# Patient Record
Sex: Male | Born: 1940
Health system: Southern US, Community
[De-identification: ages and names within clinical notes are randomized; demographics above are authoritative.]

## PROBLEM LIST (undated history)

## (undated) DIAGNOSIS — I2510 Atherosclerotic heart disease of native coronary artery without angina pectoris: Secondary | ICD-10-CM

## (undated) DIAGNOSIS — D649 Anemia, unspecified: Secondary | ICD-10-CM

## (undated) DIAGNOSIS — M009 Pyogenic arthritis, unspecified: Secondary | ICD-10-CM

## (undated) DIAGNOSIS — I350 Nonrheumatic aortic (valve) stenosis: Secondary | ICD-10-CM

## (undated) DIAGNOSIS — IMO0002 Reserved for concepts with insufficient information to code with codable children: Secondary | ICD-10-CM

## (undated) DIAGNOSIS — G252 Other specified forms of tremor: Secondary | ICD-10-CM

## (undated) DIAGNOSIS — M25569 Pain in unspecified knee: Secondary | ICD-10-CM

## (undated) DIAGNOSIS — R51 Headache: Secondary | ICD-10-CM

## (undated) DIAGNOSIS — F3289 Other specified depressive episodes: Secondary | ICD-10-CM

## (undated) DIAGNOSIS — D469 Myelodysplastic syndrome, unspecified: Secondary | ICD-10-CM

## (undated) DIAGNOSIS — G4733 Obstructive sleep apnea (adult) (pediatric): Secondary | ICD-10-CM

## (undated) DIAGNOSIS — E119 Type 2 diabetes mellitus without complications: Secondary | ICD-10-CM

## (undated) DIAGNOSIS — G250 Essential tremor: Secondary | ICD-10-CM

## (undated) DIAGNOSIS — M48061 Spinal stenosis, lumbar region without neurogenic claudication: Secondary | ICD-10-CM

## (undated) DIAGNOSIS — G8929 Other chronic pain: Secondary | ICD-10-CM

## (undated) DIAGNOSIS — D72829 Elevated white blood cell count, unspecified: Secondary | ICD-10-CM

## (undated) DIAGNOSIS — F329 Major depressive disorder, single episode, unspecified: Secondary | ICD-10-CM

## (undated) DIAGNOSIS — E782 Mixed hyperlipidemia: Secondary | ICD-10-CM

## (undated) DIAGNOSIS — R42 Dizziness and giddiness: Secondary | ICD-10-CM

## (undated) DIAGNOSIS — I10 Essential (primary) hypertension: Secondary | ICD-10-CM

## (undated) HISTORY — PX: OTHER SURGICAL HISTORY: SHX169

## (undated) HISTORY — PX: BACK SURGERY: SHX140

---
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
## 1998-09-19 ENCOUNTER — Emergency Department (HOSPITAL_COMMUNITY): Admission: EM | Admit: 1998-09-19 | Discharge: 1998-09-19 | Payer: Self-pay | Admitting: Emergency Medicine

## 1999-04-05 ENCOUNTER — Encounter: Payer: Self-pay | Admitting: Gastroenterology

## 1999-04-05 ENCOUNTER — Ambulatory Visit (HOSPITAL_COMMUNITY): Admission: RE | Admit: 1999-04-05 | Discharge: 1999-04-05 | Payer: Self-pay | Admitting: Gastroenterology

## 1999-10-11 ENCOUNTER — Encounter: Payer: Self-pay | Admitting: Gastroenterology

## 1999-10-11 ENCOUNTER — Ambulatory Visit (HOSPITAL_COMMUNITY): Admission: RE | Admit: 1999-10-11 | Discharge: 1999-10-11 | Payer: Self-pay | Admitting: Gastroenterology

## 1999-10-19 ENCOUNTER — Encounter: Payer: Self-pay | Admitting: General Surgery

## 1999-10-19 ENCOUNTER — Ambulatory Visit (HOSPITAL_COMMUNITY): Admission: RE | Admit: 1999-10-19 | Discharge: 1999-10-19 | Payer: Self-pay | Admitting: General Surgery

## 2000-01-07 ENCOUNTER — Observation Stay (HOSPITAL_COMMUNITY): Admission: RE | Admit: 2000-01-07 | Discharge: 2000-01-09 | Payer: Self-pay | Admitting: General Surgery

## 2000-03-23 ENCOUNTER — Encounter: Payer: Self-pay | Admitting: Neurology

## 2000-03-23 ENCOUNTER — Ambulatory Visit (HOSPITAL_COMMUNITY): Admission: RE | Admit: 2000-03-23 | Discharge: 2000-03-23 | Payer: Self-pay | Admitting: Neurology

## 2001-03-13 ENCOUNTER — Encounter: Payer: Self-pay | Admitting: Neurology

## 2001-03-13 ENCOUNTER — Ambulatory Visit (HOSPITAL_COMMUNITY): Admission: RE | Admit: 2001-03-13 | Discharge: 2001-03-13 | Payer: Self-pay | Admitting: Neurology

## 2001-08-02 ENCOUNTER — Ambulatory Visit (HOSPITAL_COMMUNITY): Admission: RE | Admit: 2001-08-02 | Discharge: 2001-08-02 | Payer: Self-pay | Admitting: Gastroenterology

## 2001-08-02 ENCOUNTER — Encounter: Payer: Self-pay | Admitting: Gastroenterology

## 2001-08-07 ENCOUNTER — Encounter: Payer: Self-pay | Admitting: Gastroenterology

## 2001-08-07 ENCOUNTER — Ambulatory Visit (HOSPITAL_COMMUNITY): Admission: RE | Admit: 2001-08-07 | Discharge: 2001-08-07 | Payer: Self-pay | Admitting: Gastroenterology

## 2001-11-30 ENCOUNTER — Encounter (INDEPENDENT_AMBULATORY_CARE_PROVIDER_SITE_OTHER): Payer: Self-pay | Admitting: Specialist

## 2001-11-30 ENCOUNTER — Ambulatory Visit (HOSPITAL_COMMUNITY): Admission: RE | Admit: 2001-11-30 | Discharge: 2001-11-30 | Payer: Self-pay | Admitting: Gastroenterology

## 2002-11-05 ENCOUNTER — Encounter (INDEPENDENT_AMBULATORY_CARE_PROVIDER_SITE_OTHER): Payer: Self-pay | Admitting: *Deleted

## 2002-11-05 ENCOUNTER — Ambulatory Visit (HOSPITAL_COMMUNITY): Admission: RE | Admit: 2002-11-05 | Discharge: 2002-11-05 | Payer: Self-pay | Admitting: Gastroenterology

## 2003-05-21 ENCOUNTER — Encounter: Payer: Self-pay | Admitting: Neurology

## 2003-05-21 ENCOUNTER — Ambulatory Visit (HOSPITAL_COMMUNITY): Admission: RE | Admit: 2003-05-21 | Discharge: 2003-05-21 | Payer: Self-pay | Admitting: Neurology

## 2003-08-31 ENCOUNTER — Emergency Department (HOSPITAL_COMMUNITY): Admission: EM | Admit: 2003-08-31 | Discharge: 2003-08-31 | Payer: Self-pay | Admitting: *Deleted

## 2003-08-31 ENCOUNTER — Encounter: Payer: Self-pay | Admitting: *Deleted

## 2003-10-08 ENCOUNTER — Ambulatory Visit (HOSPITAL_COMMUNITY): Admission: RE | Admit: 2003-10-08 | Discharge: 2003-10-08 | Payer: Self-pay

## 2003-10-13 ENCOUNTER — Encounter: Admission: RE | Admit: 2003-10-13 | Discharge: 2003-11-06 | Payer: Self-pay | Admitting: Neurology

## 2003-12-19 ENCOUNTER — Encounter: Admission: RE | Admit: 2003-12-19 | Discharge: 2003-12-19 | Payer: Self-pay | Admitting: Neurology

## 2003-12-25 ENCOUNTER — Encounter: Admission: RE | Admit: 2003-12-25 | Discharge: 2003-12-25 | Payer: Self-pay | Admitting: Neurology

## 2004-01-08 ENCOUNTER — Encounter: Admission: RE | Admit: 2004-01-08 | Discharge: 2004-01-08 | Payer: Self-pay | Admitting: Neurology

## 2004-04-05 ENCOUNTER — Encounter: Admission: RE | Admit: 2004-04-05 | Discharge: 2004-04-05 | Payer: Self-pay | Admitting: Neurology

## 2004-05-08 ENCOUNTER — Ambulatory Visit (HOSPITAL_COMMUNITY): Admission: RE | Admit: 2004-05-08 | Discharge: 2004-05-08 | Payer: Self-pay | Admitting: Neurology

## 2004-07-12 ENCOUNTER — Ambulatory Visit (HOSPITAL_COMMUNITY): Admission: RE | Admit: 2004-07-12 | Discharge: 2004-07-12 | Payer: Self-pay | Admitting: Neurosurgery

## 2004-09-09 ENCOUNTER — Encounter: Admission: RE | Admit: 2004-09-09 | Discharge: 2004-09-09 | Payer: Self-pay | Admitting: Neurosurgery

## 2004-09-23 ENCOUNTER — Encounter: Admission: RE | Admit: 2004-09-23 | Discharge: 2004-09-23 | Payer: Self-pay | Admitting: Neurosurgery

## 2005-02-14 ENCOUNTER — Encounter: Admission: RE | Admit: 2005-02-14 | Discharge: 2005-02-14 | Payer: Self-pay | Admitting: Internal Medicine

## 2005-04-17 ENCOUNTER — Ambulatory Visit (HOSPITAL_COMMUNITY): Admission: RE | Admit: 2005-04-17 | Discharge: 2005-04-17 | Payer: Self-pay | Admitting: Neurology

## 2005-07-26 ENCOUNTER — Encounter: Admission: RE | Admit: 2005-07-26 | Discharge: 2005-07-26 | Payer: Self-pay

## 2005-08-09 ENCOUNTER — Encounter: Admission: RE | Admit: 2005-08-09 | Discharge: 2005-08-09 | Payer: Self-pay | Admitting: Internal Medicine

## 2005-08-24 ENCOUNTER — Encounter: Admission: RE | Admit: 2005-08-24 | Discharge: 2005-08-24 | Payer: Self-pay | Admitting: Internal Medicine

## 2006-06-27 ENCOUNTER — Encounter: Admission: RE | Admit: 2006-06-27 | Discharge: 2006-06-27 | Payer: Self-pay | Admitting: Neurology

## 2007-10-15 ENCOUNTER — Encounter: Admission: RE | Admit: 2007-10-15 | Discharge: 2007-10-15 | Payer: Self-pay | Admitting: Gastroenterology

## 2007-10-31 ENCOUNTER — Encounter: Admission: RE | Admit: 2007-10-31 | Discharge: 2007-10-31 | Payer: Self-pay | Admitting: Neurosurgery

## 2008-01-09 ENCOUNTER — Encounter: Admission: RE | Admit: 2008-01-09 | Discharge: 2008-01-09 | Payer: Self-pay | Admitting: Interventional Cardiology

## 2008-01-11 ENCOUNTER — Inpatient Hospital Stay (HOSPITAL_BASED_OUTPATIENT_CLINIC_OR_DEPARTMENT_OTHER): Admission: RE | Admit: 2008-01-11 | Discharge: 2008-01-11 | Payer: Self-pay | Admitting: Interventional Cardiology

## 2008-05-07 ENCOUNTER — Encounter: Admission: RE | Admit: 2008-05-07 | Discharge: 2008-05-07 | Payer: Self-pay | Admitting: Neurology

## 2008-05-13 ENCOUNTER — Encounter: Admission: RE | Admit: 2008-05-13 | Discharge: 2008-05-13 | Payer: Self-pay | Admitting: Neurology

## 2008-05-30 ENCOUNTER — Inpatient Hospital Stay (HOSPITAL_COMMUNITY): Admission: RE | Admit: 2008-05-30 | Discharge: 2008-05-31 | Payer: Self-pay | Admitting: Interventional Cardiology

## 2008-06-07 ENCOUNTER — Emergency Department (HOSPITAL_COMMUNITY): Admission: EM | Admit: 2008-06-07 | Discharge: 2008-06-07 | Payer: Self-pay | Admitting: Emergency Medicine

## 2008-06-07 ENCOUNTER — Ambulatory Visit: Payer: Self-pay | Admitting: Cardiology

## 2009-09-22 ENCOUNTER — Inpatient Hospital Stay (HOSPITAL_COMMUNITY): Admission: AD | Admit: 2009-09-22 | Discharge: 2009-09-24 | Payer: Self-pay | Admitting: Obstetrics and Gynecology

## 2009-10-05 ENCOUNTER — Encounter (INDEPENDENT_AMBULATORY_CARE_PROVIDER_SITE_OTHER): Payer: Self-pay | Admitting: Interventional Cardiology

## 2009-10-30 ENCOUNTER — Encounter: Admission: RE | Admit: 2009-10-30 | Discharge: 2009-10-30 | Payer: Self-pay | Admitting: Gastroenterology

## 2010-07-19 ENCOUNTER — Encounter: Admission: RE | Admit: 2010-07-19 | Discharge: 2010-07-19 | Payer: Self-pay | Admitting: Internal Medicine

## 2010-08-12 ENCOUNTER — Inpatient Hospital Stay (HOSPITAL_COMMUNITY): Admission: EM | Admit: 2010-08-12 | Discharge: 2010-08-13 | Payer: Self-pay

## 2010-10-29 ENCOUNTER — Encounter
Admission: RE | Admit: 2010-10-29 | Discharge: 2010-10-29 | Payer: Self-pay | Source: Home / Self Care | Attending: Neurology | Admitting: Neurology

## 2010-11-09 ENCOUNTER — Encounter
Admission: RE | Admit: 2010-11-09 | Discharge: 2010-11-09 | Payer: Self-pay | Source: Home / Self Care | Attending: Neurology | Admitting: Neurology

## 2010-12-02 ENCOUNTER — Encounter
Admission: RE | Admit: 2010-12-02 | Discharge: 2010-12-02 | Payer: Self-pay | Source: Home / Self Care | Attending: Neurology | Admitting: Neurology

## 2010-12-11 ENCOUNTER — Encounter: Payer: Self-pay | Admitting: Neurology

## 2010-12-12 ENCOUNTER — Encounter: Payer: Self-pay | Admitting: Internal Medicine

## 2010-12-12 ENCOUNTER — Encounter: Payer: Self-pay | Admitting: Gastroenterology

## 2010-12-12 ENCOUNTER — Encounter: Payer: Self-pay | Admitting: Neurology

## 2011-01-31 ENCOUNTER — Other Ambulatory Visit: Payer: Self-pay | Admitting: Dermatology

## 2011-02-03 LAB — COMPREHENSIVE METABOLIC PANEL
ALT: 28 U/L (ref 0–53)
Alkaline Phosphatase: 37 U/L — ABNORMAL LOW (ref 39–117)
BUN: 21 mg/dL (ref 6–23)
Calcium: 9.4 mg/dL (ref 8.4–10.5)
Chloride: 94 mEq/L — ABNORMAL LOW (ref 96–112)
Glucose, Bld: 157 mg/dL — ABNORMAL HIGH (ref 70–99)
Potassium: 4 mEq/L (ref 3.5–5.1)
Total Bilirubin: 0.2 mg/dL — ABNORMAL LOW (ref 0.3–1.2)
Total Protein: 6.6 g/dL (ref 6.0–8.3)

## 2011-02-03 LAB — TROPONIN I: Troponin I: 0.05 ng/mL (ref 0.00–0.06)

## 2011-02-03 LAB — CARDIAC PANEL(CRET KIN+CKTOT+MB+TROPI)
CK, MB: 3.7 ng/mL (ref 0.3–4.0)
CK, MB: 4.4 ng/mL — ABNORMAL HIGH (ref 0.3–4.0)
Total CK: 123 U/L (ref 7–232)
Total CK: 92 U/L (ref 7–232)
Troponin I: 0.01 ng/mL (ref 0.00–0.06)
Troponin I: 0.02 ng/mL (ref 0.00–0.06)

## 2011-02-03 LAB — GLUCOSE, CAPILLARY
Glucose-Capillary: 165 mg/dL — ABNORMAL HIGH (ref 70–99)
Glucose-Capillary: 197 mg/dL — ABNORMAL HIGH (ref 70–99)
Glucose-Capillary: 216 mg/dL — ABNORMAL HIGH (ref 70–99)
Glucose-Capillary: 216 mg/dL — ABNORMAL HIGH (ref 70–99)
Glucose-Capillary: 281 mg/dL — ABNORMAL HIGH (ref 70–99)

## 2011-02-03 LAB — MRSA PCR SCREENING: MRSA by PCR: NEGATIVE

## 2011-02-03 LAB — CBC
MCHC: 33.6 g/dL (ref 30.0–36.0)
MCV: 88 fL (ref 78.0–100.0)
Platelets: 220 10*3/uL (ref 150–400)
RDW: 14.8 % (ref 11.5–15.5)
WBC: 14.9 10*3/uL — ABNORMAL HIGH (ref 4.0–10.5)

## 2011-02-03 LAB — PROTIME-INR: Prothrombin Time: 13.4 seconds (ref 11.6–15.2)

## 2011-02-03 LAB — DIFFERENTIAL: Lymphocytes Relative: 12 % (ref 12–46)

## 2011-02-03 LAB — HEMOGLOBIN A1C: Mean Plasma Glucose: 154 mg/dL — ABNORMAL HIGH (ref <117)

## 2011-02-03 LAB — CK TOTAL AND CKMB (NOT AT ARMC): Relative Index: 3.3 — ABNORMAL HIGH (ref 0.0–2.5)

## 2011-02-23 LAB — BASIC METABOLIC PANEL
CO2: 26 mEq/L (ref 19–32)
CO2: 32 mEq/L (ref 19–32)
Chloride: 95 mEq/L — ABNORMAL LOW (ref 96–112)
GFR calc Af Amer: >60 mL/min (ref >60)
GFR calc Af Amer: >60 mL/min (ref >60)
Glucose, Bld: 135 mg/dL — ABNORMAL HIGH (ref 70–99)
Potassium: 4.6 mEq/L (ref 3.5–5.1)
Potassium: 5 mEq/L (ref 3.5–5.1)
Sodium: 131 mEq/L — ABNORMAL LOW (ref 135–145)
Sodium: 134 mEq/L — ABNORMAL LOW (ref 135–145)

## 2011-02-23 LAB — DIFFERENTIAL
Basophils Absolute: 0 10*3/uL (ref 0.0–0.1)
Eosinophils Absolute: 0.1 10*3/uL (ref 0.0–0.7)
Eosinophils Relative: 1 % (ref 0–5)
Lymphocytes Relative: 16 % (ref 12–46)
Neutrophils Relative %: 75 % (ref 43–77)

## 2011-02-23 LAB — APTT: aPTT: 32 seconds (ref 24–37)

## 2011-02-23 LAB — CBC
HCT: 35.9 % — ABNORMAL LOW (ref 39.0–52.0)
HCT: 37.7 % — ABNORMAL LOW (ref 39.0–52.0)
Hemoglobin: 12.5 g/dL — ABNORMAL LOW (ref 13.0–17.0)
Hemoglobin: 13 g/dL (ref 13.0–17.0)
MCHC: 34.6 g/dL (ref 30.0–36.0)
MCV: 88.8 fL (ref 78.0–100.0)
Platelets: 199 10*3/uL (ref 150–400)
RBC: 4 MIL/uL — ABNORMAL LOW (ref 4.22–5.81)
RBC: 4.17 MIL/uL — ABNORMAL LOW (ref 4.22–5.81)
RDW: 12.7 % (ref 11.5–15.5)
RDW: 12.7 % (ref 11.5–15.5)
WBC: 9.5 10*3/uL (ref 4.0–10.5)

## 2011-02-23 LAB — CARDIAC PANEL(CRET KIN+CKTOT+MB+TROPI)
CK, MB: 5 ng/mL — ABNORMAL HIGH (ref 0.3–4.0)
CK, MB: 5.6 ng/mL — ABNORMAL HIGH (ref 0.3–4.0)
Relative Index: 4.1 — ABNORMAL HIGH (ref 0.0–2.5)
Relative Index: 5.5 — ABNORMAL HIGH (ref 0.0–2.5)
Total CK: 157 U/L (ref 7–232)
Total CK: 168 U/L (ref 7–232)
Troponin I: 0.01 ng/mL (ref 0.00–0.06)
Troponin I: 0.01 ng/mL (ref 0.00–0.06)

## 2011-02-23 LAB — COMPREHENSIVE METABOLIC PANEL
Albumin: 4 g/dL (ref 3.5–5.2)
BUN: 16 mg/dL (ref 6–23)
Creatinine, Ser: 1 mg/dL (ref 0.4–1.5)
Total Protein: 6.4 g/dL (ref 6.0–8.3)

## 2011-02-23 LAB — BRAIN NATRIURETIC PEPTIDE: Pro B Natriuretic peptide (BNP): <30 pg/mL (ref 0.0–100.0)

## 2011-02-23 LAB — PROTIME-INR: INR: 1.07 (ref 0.00–1.49)

## 2011-03-22 ENCOUNTER — Other Ambulatory Visit (HOSPITAL_COMMUNITY): Payer: Self-pay | Admitting: Internal Medicine

## 2011-03-25 ENCOUNTER — Other Ambulatory Visit (HOSPITAL_COMMUNITY): Payer: Self-pay | Admitting: Internal Medicine

## 2011-03-25 DIAGNOSIS — T17998A Other foreign object in respiratory tract, part unspecified causing other injury, initial encounter: Secondary | ICD-10-CM

## 2011-03-31 ENCOUNTER — Other Ambulatory Visit (HOSPITAL_COMMUNITY): Payer: Self-pay

## 2011-03-31 ENCOUNTER — Ambulatory Visit (HOSPITAL_COMMUNITY)
Admission: RE | Admit: 2011-03-31 | Discharge: 2011-03-31 | Disposition: A | Discharge status: Home / Self Care | Payer: Medicare Other | Source: Ambulatory Visit | Attending: Internal Medicine | Admitting: Internal Medicine

## 2011-03-31 ENCOUNTER — Ambulatory Visit (HOSPITAL_COMMUNITY): Payer: Self-pay

## 2011-03-31 DIAGNOSIS — T17998A Other foreign object in respiratory tract, part unspecified causing other injury, initial encounter: Secondary | ICD-10-CM

## 2011-03-31 DIAGNOSIS — R1310 Dysphagia, unspecified: Secondary | ICD-10-CM | POA: Insufficient documentation

## 2011-04-05 NOTE — Cardiovascular Report (Signed)
NAME:  Jorge, Sherman NO.:  [id number]   MEDICAL RECORD NO.:  [id number]          PATIENT TYPE:  OIB   LOCATION:  1962                         FACILITY:  [facility]   PHYSICIAN:  Lyn Records, M.D.   DATE OF BIRTH:  27-Feb-1941   DATE OF PROCEDURE:  01/11/2008  DATE OF DISCHARGE:                            CARDIAC CATHETERIZATION   INDICATIONS FOR PROCEDURE:  Cardiolite study demonstrating ischemic left  ventricular dilatation, transient on Cardiolite study, EF 53%.  No  predominant region underperfusion was noted.  Study is being done to  rule out matched 3-vessel coronary disease with ischemic dilatation.   PROCEDURES PERFORMED:  1. Left heart catheterization.  2. Selective coronary angiography.  3. Left ventriculorrhaphy.   DESCRIPTION OF PROCEDURE:  After informed consent, a 4-French sheath was  placed in the right femoral artery using a modified Seldinger technique.  A 4-French A2 multipurpose catheter was then used for hemodynamic  recordings, left ventriculography by hand injection, and selective right  coronary angiography.  Multiple views of the right coronary were  performed and 200 mcg of intracoronary nitroglycerin was administered.   We used a #4 left Judkins catheter for left coronary angiography.  The  patient tolerated the procedure without complications.  Intravenous  labetalol. 20 mg, was given for blood pressure control.  No  complications occurred.   RESULTS:  1. Hemodynamic data:  (a)  Aortic pressure 142/85.  (b)  Left ventricular pressure 142/13.  1. Left ventriculography:  The left ventricular cavity size is normal.      Left ventricular function is normal.  The ejection fraction is      estimated to be greater than 60%.  2. Coronary angiography:  (a)  Left main coronary:  The left main is widely patent.  (b)  Left anterior descending coronary:  LAD is heavily calcified.  There is a region of particular heavy calcification in the  proximal/mid  vessel and had a bifurcation with the large branching first diagonal.  LAD in this region is difficult to interpret, but there is 70% to 80%  eccentric stenosis.  The diagonal in this region contains a 70% to 80%  stenosis as well.  The LAD beyond this point is large and supplies the  left ventricular apex.  The vessel is smooth.  No significant  obstruction is seen.  (c)  Circumflex artery:  The circumflex coronary artery is a large  vessel giving origin to 4 obtuse marginal branches, the first 2 are  small, third is a large branching vessel, and fourth is small.  No  significant obstruction is noted.  (d)  Right coronary:  The right coronary artery contains luminal  irregularities throughout the proximal, mid and distal vessel.  Catheter-  induced spasm is noted at the tip of the right coronary catheter  proximally.  No high-grade or significant stenoses are noted in the  right coronary.  The right coronary is dominant.   CONCLUSIONS:  1. Significant proximal LAD within a region of heavy calcification.      The lesion is in a bifurcational position with the large  first      diagonal.  2. Normal left ventricular function.   PLAN:  Anti-ischemic therapy.  Start beta-blocker therapy for blood  pressure.  Sublingual nitroglycerin, if any, even mild symptoms of  angina, and aspirin.  Consider PCI.  PCI would need to be with the DES.  This may prevent back surgery for a year.  We will speak with the  patient about this in detail.  It was conceivable that he could get  through his surgical procedure and have PCI performed thereafter.  PCI  would add some increase risk because of the heavy calcification and the  bifurcational status of the lesion.      Lyn Records, M.D.  Electronically Signed     HWS/MEDQ  D:  01/11/2008  T:  01/12/2008  Job:  [id number]   cc:   Hilda Lias, M.D.  Evie Lacks, MD  Georgianne Fick, M.D.  Carlos A. Timoteo Ace, M.D.

## 2011-04-05 NOTE — Cardiovascular Report (Signed)
NAME:  Jorge Sherman, Jorge Sherman NO.:  [id number]   MEDICAL RECORD NO.:  [id number]          PATIENT TYPE:  INP   LOCATION:  2921                         FACILITY:  [facility]   PHYSICIAN:  Lyn Records, M.D.   DATE OF BIRTH:  08-05-1941   DATE OF PROCEDURE:  DATE OF DISCHARGE:                            CARDIAC CATHETERIZATION   INDICATIONS:  The patient has had recurring chest discomfort responsive  to nitroglycerin.  Cardiac catheterization in February demonstrated 70-  80% LAD diagonal, bifurcation stenosis within a heavily calcified  segment, and 50% proximal right coronary stenosis.  A Cardiolite study  demonstrated ischemic dilatation without focal perfusion abnormality.  Because of continued symptoms, the plan is to perform intravascular  ultrasound on the LAD and the right coronary and to stent if indicated.   PROCEDURES PERFORMED:  1. Coronary angiography.  2. Intravascular ultrasound.  3. Bare-metal stent left anterior descending.  4. Bare-metal stent, right coronary.   DESCRIPTION:  After informed consent, a 6-French sheath was placed in  the right femoral artery using the modified Seldinger technique.  A 6-  [place] Judkins right catheter was used for right coronary angiography.  Deep intubation of the artery occurred with the catheter.  Angiography  was performed and at the catheter tip a filling defect was noted.  This  did not appear to be obstructive.  We then turned our attention to the  left coronary artery.  The left coronary was intubated with a 6-French  CLS 3.5-cm catheter.  We obtained guiding shots.  The lesion in the LAD  was intermediate, but appeared significant in certain views.  Heavy  calcification prevented high quality angiographic assessment.  We used  Angiomax bolus and infusion to achieve an ACT greater than 300.  We then  performed intravascular ultrasound using the Atlantis ultrasound device.  A cross-sectional area within the LAD just  proximal to the septal  perforator has circumferential calcium and a cross-sectional area of 3.9-  4.0 sq.m.  The distal reference diameter was around 3.2 mm and the  proximal reference diameter was greater than 4.  There was a region of  ectasia proximal to the LAD diagonal bifurcation.  We performed PCI  using predilatation with a [place] sprinter to 14 atmospheres 3.0 mm in  diameter, 12-mm long balloon.  We then deployed a Liberte 3.5 x 16-mm  long stent.  We postdilated the proximal one half of the stent with a  4.0 x 8-mm Quantum balloon at 12 atmospheres.  The diagonal branch  remained patent.  We protected the diagonal branch during the initial  inflations and stent deployment with BMW wire.  This wire was easily  retrieved.   We then turned our attention back to the right coronary, as the patient  complained of continuous chest discomfort during the procedure.  In fact  the patient's chest discomfort started when we began infiltrating his  groin with Xylocaine.  This seemed intensified with each coronary  injection.  It intensified with PCI and iris on the left coronary  system.  He continued after successful PCI on the LAD.  This led [place] to  revisualization the right coronary.  Recannulation of the right coronary  demonstrated the same eccentric proximal stenosis that appeared to be a  filling defect.  It is uncertain to me whether or not this filling  defect was obstructive, but certainly with him continuing to have chest  discomfort I felt that it needed to be further explored.  We used a  guide catheter 6-French, JR-4.  We were able to successfully negotiate  this region with a [place] wire and attempted to perform intravascular  ultrasound.  Intravascular ultrasound device would not course through  this stenosis.  We placed a buddy wire and still could not get the  intravascular ultrasound device down.  Angiography after attempts to get  the intravascular ultrasound device down  demonstrated an eccentric  greater than 80% stenosis.  The patient continued to have chest  discomfort throughout this entire time.  We predilated this area with  the 12-mm long x 3.0-mm [place] sprinter.  This intensified the chest  discomfort.  No ST-segment changes were noted during balloon inflations  or during nonballoon inflation.  We deployed a Multilink Vision 23-mm  long, 3.0-mm diameter stent around the bend in the proximal/mid-LAD to  cover the region of the vessel/plaque disruption.  We postdilated with a  3.25 x 20-mm long Quantum Maverick to 15 atmospheres.  Brisk antegrade  flow was noted and no obvious embolic sequelae or dye hang-up/slow flow  was noted.  The patient continued to have discomfort.  After finishing  the right coronary, we then took a relook at the left coronary to make  sure that there was not side branch occlusion or some explanation for  the continued chest discomfort.  LAD was widely patent.  No obvious  dissection in the left main was noted.  The proximal LAD contained a  focal region of 50% narrowing that was present prior to PCI.  Flow in  the LAD system and diagonal was also brisk.   We terminated the case.  We started a single bolus and infusion of  Integrilin.  Angiomax was discontinued.  The patient had been preloaded  with 300 mg of Plavix and had already been on Plavix chronically.   RESULTS:  1. Left coronary angiography demonstrated 70-80% stenosis in the mid      LAD, as it bifurcates with a large second diagonal.      a.     The circumflex and left main were widely patent.      b.     The right coronary contained a region of minimal disruption       just distal to the cath towards the side during diagnostic       angiography.  It is uncertain at this point if this is related to       plaque disruption from the catheter tip or spontaneous progression       in disease since the diagnostic cath in February.  2. Intravascular ultrasound:  Obvious  flow via LAD diagonal      bifurcation demonstrated a cross-sectional area of 4 mm square or      less with circumferential calcium.  3. Percutaneous coronary intervention.      a.     Bare metal Liberte stent with reduction in 70-80% LAD       stenosis to less than 10% with TIMI grade 3 flow and no evidence       of side branch obstruction.  b.     Successful bare-metal stent proximal and mid RCA with a 23-       mm long x 3.0-mm diameter Vision bare-metal stent with 0% stenosis       postprocedure and TIMI grade 3 flow.  No evidence of slow flow or       embolization is noted.   CONCLUSIONS:  1. Chest pain syndrome in this gentleman making it very difficult to      sort out what is ischemic/coronary related and what may be      musculoskeletal or secondary to other causes for chest pain.  2. Successful bare-metal stent LAD 70-80% stenosis to 0%.  3. Successful bare-metal stent of the RCA with reduction in stenosis      from 80% to 0%.  It is possible that the lesion in the right      coronary has developed and progressed since the diagnostic cath or      that.  Noncritical disease was converted to critical disease by      catheter tip disruption during coronary angiography.  4. Continuing chest pain syndrome without obvious source.  Must rule      out microembolization from one of the PCI sites.  Therefore, we      will place the patient on Integrilin, IV nitroglycerin, continue      Plavix, cycle enzymes, and follow clinically in step-down unit.   PLAN:  1. Cycle enzymes.  2. Plavix and aspirin combination for at least 4 weeks.  3. Further management of the patient's chest pain syndrome depending      upon his clinical course.      Lyn Records, M.D.  Electronically Signed     HWS/MEDQ  D:  05/30/2008  T:  05/31/2008  Job:  [id number]   cc:   Georgianne Fick, M.D.  Evie Lacks, MD  Hilda Lias, M.D.  Acie Fredrickson, MD

## 2011-04-05 NOTE — H&P (Signed)
NAME:  Jorge Sherman, Jorge Sherman NO.:  [id number]   MEDICAL RECORD NO.:  [id number]          PATIENT TYPE:  INP   LOCATION:  1827                         FACILITY:  [facility]   PHYSICIAN:  Vernice Jefferson, MD          DATE OF BIRTH:  05/04/1941   DATE OF ADMISSION:  DATE OF DISCHARGE:                              HISTORY & PHYSICAL   CHIEF COMPLAINT:  Chest pain x5 hours.   HISTORY OF PRESENT ILLNESS:  The patient is a 70 year old white male  with history of PCI on May 30, 2008 to RCA and mid LAD with bare-metal  stents, complains of chest pain that started today at approximately 4:30  p.m.  He states he took first nitroglycerin, no relief.  After second  nitroglycerin, he did get relief; however, he took an additional third  nitroglycerin that dropped his blood pressure.  He spoke with the on-  call cardiologist and told him to come to the emergency department.  At  that point, his chest pain had completely resolved.  Currently, he has  no pain now and he is feeling back to his baseline.  Prior to his PCI,  approximately 1 week ago, he has never had any type of chest pain or  discomfort.   PAST MEDICAL HISTORY:  1. Coronary artery disease (status post PCI to mid LAD with 3.5 x 16      mm [place] stent, RCA with a 3.0 x 23 mm Vision stent).  2. Hypertension.  3. Hyperlipidemia.  4. Chronic pain.   SOCIAL HISTORY:  Lives in the [place] area.  He has a 30-pack year of  smoking, but quit approximately a year ago.  No alcohol.  No drug use.   FAMILY HISTORY:  Noncontributory to the patient's current medical  condition.   REVIEW OF SYSTEMS:  Negative 11-point review of systems except for those  as dictated in the above HPI.   ALLERGIES:  No known drug allergies.   CURRENT MEDICATIONS:  1. Aspirin 325 mg a day.  2. Plavix 75 mg a day.  3. Lisinopril 10 mg once a day.  4. Hydrochlorothiazide 25 mg a day.  5. Neurontin 800 mg t.i.d.  6. Percocet two 10/325 tabs q.8 h. as  needed.  7. Nexium 40 mg a day.  8. Crestor 20 mg a day.   PHYSICAL EXAMINATION:  VITAL SIGNS:  His blood pressure is 118/72, heart  rate of 80, and temperature is afebrile.  GENERAL:  Well-developed, well-nourished white male, in no acute  distress.  HEENT:  Moist mucous membranes.  No scleral icterus.  No conjunctival  pallor.  NECK:  Supple.  Full range of motion.  No jugular venous distention.  CARDIOVASCULAR:  Regular rate and rhythm.  No rubs, murmurs, or gallops.  CHEST:  Clear to auscultation bilaterally.  No wheezes, rales, or  rhonchi.  ABDOMEN:  Soft, nontender, and nondistended.  Normoactive bowel sounds.  EXTREMITIES:  No peripheral edema.  Pulses 2+ bilaterally.  NEUROLOGIC:  Nonfocal.   Chest x-ray demonstrated no acute infiltrate or process.  EKG, normal  sinus rhythm, normal EKG.   LABORATORY DATA:  Normal white count, hemoglobin 10.7, and platelets  233.  Biomarkers are negative and Chem-8 is still pending.   IMPRESSION:  1. Probable acute coronary syndrome.  2. Hyperlipidemia.  3. Hypertension.  4. Chronic pain.   PLAN:  Admit to telemetry monitoring, Dr. Verdis Prime, aspirin, Plavix,  and heparin protocol.  Beta-blocker and statin also will be initiated  n.p.o. for a possible risk stratification in the morning.  We will also  continue his home medications for his chronic pain as he has been taking  this.      Vernice Jefferson, MD  Electronically Signed     JT/MEDQ  D:  06/07/2008  T:  06/08/2008  Job:  [id number]

## 2011-04-08 NOTE — Op Note (Signed)
NAME:  Jorge Sherman, Jorge Sherman                         ACCOUNT NO.:  [id number]   MEDICAL RECORD NO.:  [id number]                   PATIENT TYPE:  AMB   LOCATION:  ENDO                                 FACILITY:  [facility]   PHYSICIAN:  Petra Kuba, M.D.                 DATE OF BIRTH:  1941-02-02   DATE OF PROCEDURE:  11/05/2002  DATE OF DISCHARGE:                                 OPERATIVE REPORT   PROCEDURE:  Colonoscopy with polypectomy.   INDICATION:  Patient with history of colon polyps, family history of colon  cancer, due for repeat screening.  Consent was signed after risks, benefits,  methods, options thoroughly discussed multiple times in the past.   MEDICINES USED:  Demerol 110, Versed 12.   DESCRIPTION OF PROCEDURE:  Rectal inspection pertinent for external  hemorrhoids, small.  Digital exam was negative.  The video colonoscope was  inserted, easily advanced around the colon to the cecum.  This did not  require any abdominal pressure or any position changes.  No obvious  abnormality was seen on insertion.  The cecum was identified by the  appendiceal orifice and the ileocecal valve.  In fact, the scope was  inserted a short ways into the terminal ileum which was normal.  Photodocumentation was obtained.  The scope was slowly withdrawn.  The prep  was fairly adequate, did require greater than one liter of washing and  suctioning, and still there was some stool adherent to the wall which could  not be washed off, but on slow withdrawal through the colon, multiple tiny  hyperplastic-appearing polyps were seen.  One in the cecum was cold biopsied  x 2.  One in the ascending was hot biopsied on a setting of 20-20, did have  some bleeding which did stop spontaneously.  Went ahead and did his other  hot biopsies on 30-30 of the transverse, descending, and sigmoid.  A total  of approximately five hot biopsies were done.  Once back in the rectum, the  scope was retroflexed, pertinet for  some internal hemorrhoids.  The scope  was straightened and readvanced a short ways up the left side of the colon;  air was suctioned and scope removed.  The patient tolerated the procedure  well.  There was no obvious immediate complication.   ENDOSCOPIC DIAGNOSES:  1. Internal-external small hemorrhoids.  2. Fairly adequate prep, greater than one liter of washing.  3. Multiple tiny questionable hyperplastic-appearing polyps with hot     biopsies in the sigmoid, descending, transverse, and ascending.  Cold     biopsy in the cecum.  4. Otherwise normal to the terminal ileum.    PLAN:  Await pathology to determine future colonic screening. Yearly rectals  and guaiacs per primary care, and I am happy to see back at any time p.r.n.  Petra Kuba, M.D.    MEM/MEDQ  D:  11/05/2002  T:  11/05/2002  Job:  [id number]   cc:   Christiana Fuchs, M.D.  [address]  [place], [place] [address]  Fax: [phone]

## 2011-04-08 NOTE — Procedures (Signed)
[facility]  Patient:    Jorge Sherman, Jorge Sherman Visit Number: [id number] MRN: [id number]          Service Type: END Location: ENDO Attending Physician:  Deneen Harts Dictated by:   Petra Kuba, M.D. Proc. Date: 11/30/01 Admit Date:  11/30/2001   CC:         Trudee Kuster, M.D.   Procedure Report  PROCEDURE:  Esophagogastroduodenoscopy with biopsy.  INDICATION:  Patient with persistent upper tract symptoms.  Consent was signed after risks, benefits, methods, and options thoroughly discussed in the office on multiple occasions.  MEDICATIONS:  Demerol 100, Versed 10.  DESCRIPTION OF PROCEDURE:  Video endoscope was inserted by direct vision.  The esophagus was normal.  In the distal esophagus was a tiny hiatal hernia.  I did not appreciate any obvious Barretts or signs of esophagitis.  The scope was inserted into the stomach, advanced through a normal antrum, normal pylorus, into a normal duodenal bulb, and around the C-loop to a normal second portion of the duodenum.  A few scattered duodenal biopsies were obtained. The scope was withdrawn back to the bulb, and a good look there ruled out ulcers in that location.  The scope was withdrawn back to the stomach and retroflexed.  Cardia, angularis, fundus, lesser and greater were normal except for some minimal amount of gastritis seen.  The scope was straightened, and other than the minimal gastritis, no other abnormalities were seen.  A few scattered antral biopsies and a few of the proximal stomach were obtained to rule out Helicobacter or other abnormalities.  The scope was then slowly withdrawn back to 20 cm.  No additional esophageal findings were seen.  We did take a few distal esophageal biopsies.  Unfortunately, the first distal esophageal biopsy was put in the second container with the stomach, but three additional biopsies of the distal esophagus were put in the third container. Air was  suctioned and the scope slowly withdrawn.  Again, no esophageal abnormality was seen.  The scope was removed.  The patient tolerated the procedure well.  There was no obvious immediate complication.  ENDOSCOPIC DIAGNOSES: 1. Tiny hiatal hernia. 2. Minimal gastritis, status post biopsy. 3. Otherwise, within normal limits to second portion of the duodenum, status    post distal esophageal biopsy and duodenal biopsies just to rule out any    microscopic abnormality.  PLAN:  Await pathology.  Follow up p.r.n..  Continue current management, and otherwise follow up in 2-3 months.  We did re-discuss motility problems and irritable bowel with periodic checks just to make sure nothing has changed. Dictated by:   Petra Kuba, M.D. Attending Physician:  Deneen Harts DD:  11/30/01 TD:  11/30/01 Job: [id number]

## 2011-04-08 NOTE — Discharge Summary (Signed)
NAMEMarland Sherman  GURKARAN, RAHM NO.:  [id number]   MEDICAL RECORD NO.:  [id number]          PATIENT TYPE:  INP   LOCATION:  2921                         FACILITY:  [facility]   PHYSICIAN:  Lyn Records, M.D.   DATE OF BIRTH:  [birth year]   DATE OF ADMISSION:  05/30/2008  DATE OF DISCHARGE:  05/31/2008                               DISCHARGE SUMMARY   DISCHARGE DIAGNOSES:  1. Coronary artery disease, status post bare-metal stent to the left      anterior descending and percutaneous coronary intervention to the      right coronary artery on May 30, 2008.  2. Hypertension, treated.  3. Hyperlipidemia, treated.  4. Intolerance to beta-blockers.  5. Depression.  6. Chronic back and knee discomfort.  7. Iodine allergy.   HOSPITAL COURSE:  Jorge Sherman is a 70 year old male patient who was  admitted after having recurrent chest discomfort responsive to  nitroglycerin.  The cardiac catheterization in February 2009, showed a  70-80% LAD diagonal lesions and 50% right coronary artery stenosis.  A  subsequent Cardiolite study demonstrated ischemic dilatation without  focal perfusion abnormality but because of his continued symptoms, the  plan was for him to come into the hospital for intravascular ultrasound  of the LAD and right coronary artery to see if a stent was indicated.   As per Dr. Michaelle Copas dictated note, the patient did require bare-metal  stent placement to the LAD as well as the right coronary artery reducing  back the lesion to a 0% post procedure stenosis.  The patient tolerated  the procedure well and was kept overnight in the hospital and was  discharged to home the following day.   LABORATORY STUDIES:  Included 3 sets of cardiac isoenzymes, which were  negative.  Platelet count was 246, white count 11.7, hemoglobin 10.6,  and hematocrit 30.5.  BUN 21, creatinine 1.12, sodium 138, potassium  3.9, and BNP of 81.   The patient was discharged to home in stable and  improved condition.   The patient was discharged to home on the following  medications:  1. Baby aspirin 81 mg a day.  2. Plavix 75 mg a day.  3. Neurontin 800 mg 3 times a day.  4. Percocet 10/325 four times per day.  5. Lisinopril 40 mg a day.  6. Protonix 40 mg a day.  7. Chlorthalidone 25 mg a day.  8. Trazodone 50 mg a day.  9. Celebrex 200 mg a day.  10.Fenofibrate 160 mg daily.  11.Lorazepam 1 mg twice a day as needed.  12.Sublingual nitroglycerin p.r.n. chest pain.  13.Cymbalta 60 mg a day.  14.Crestor 20 mg a day.  15.Chlordiazepoxide 4 times a day.   The patient was remained on a low-fat heart-healthy diet.  Clean cath  site gently with soap and water.  Increase activity slowly.  No lifting  for 1 week over 10 pounds.  No driving for 2 days.  Follow with Dr.  Katrinka Blazing on June 16, 2008 at 2 p.m.      Guy Franco, P.A.      Sherilyn Cooter  Malissa Hippo, M.D.  Electronically Signed    LB/MEDQ  D:  07/15/2008  T:  07/16/2008  Job:  [id number]   cc:   Georgianne Fick, M.D.

## 2011-04-25 ENCOUNTER — Ambulatory Visit: Payer: Medicare Other | Attending: Neurology | Admitting: Rehabilitation

## 2011-04-25 DIAGNOSIS — M545 Low back pain, unspecified: Secondary | ICD-10-CM | POA: Insufficient documentation

## 2011-04-25 DIAGNOSIS — M2569 Stiffness of other specified joint, not elsewhere classified: Secondary | ICD-10-CM | POA: Insufficient documentation

## 2011-04-25 DIAGNOSIS — IMO0001 Reserved for inherently not codable concepts without codable children: Secondary | ICD-10-CM | POA: Insufficient documentation

## 2011-07-29 ENCOUNTER — Other Ambulatory Visit: Payer: Self-pay | Admitting: Gastroenterology

## 2011-08-18 LAB — POCT I-STAT, CHEM 8
Calcium, Ion: 1.19
Chloride: 97
Creatinine, Ser: 1.2
Glucose, Bld: 126 — ABNORMAL HIGH
HCT: 35 — ABNORMAL LOW

## 2011-08-18 LAB — CARDIAC PANEL(CRET KIN+CKTOT+MB+TROPI)
CK, MB: 3.1
Relative Index: INVALID
Relative Index: INVALID
Total CK: 59
Total CK: 60
Troponin I: 0.04
Troponin I: 0.04

## 2011-08-18 LAB — BASIC METABOLIC PANEL
BUN: 21
CO2: 34 — ABNORMAL HIGH
Calcium: 9.1
Creatinine, Ser: 1.12
GFR calc non Af Amer: >60
Glucose, Bld: 112 — ABNORMAL HIGH
Sodium: 138

## 2011-08-18 LAB — CBC
Hemoglobin: 10.6 — ABNORMAL LOW
MCHC: 34.6
Platelets: 227
RDW: 13.1

## 2011-08-18 LAB — B-NATRIURETIC PEPTIDE (CONVERTED LAB): Pro B Natriuretic peptide (BNP): 81

## 2011-08-19 LAB — DIFFERENTIAL
Basophils Relative: 0 % (ref 0–1)
Eosinophils Absolute: 0.2 10*3/uL (ref 0.0–0.7)
Eosinophils Relative: 2 % (ref 0–5)
Lymphs Abs: 1.5 10*3/uL (ref 0.7–4.0)
Neutrophils Relative %: 69 % (ref 43–77)

## 2011-08-19 LAB — B-NATRIURETIC PEPTIDE (CONVERTED LAB): Pro B Natriuretic peptide (BNP): <30 pg/mL (ref 0.0–100.0)

## 2011-08-19 LAB — CBC
HCT: 31.1 % — ABNORMAL LOW (ref 39.0–52.0)
MCHC: 34.3 g/dL (ref 30.0–36.0)
MCV: 86.1 fL (ref 78.0–100.0)
Platelets: 233 10*3/uL (ref 150–400)
RDW: 13 % (ref 11.5–15.5)
WBC: 8.4 10*3/uL (ref 4.0–10.5)

## 2011-08-19 LAB — POCT CARDIAC MARKERS
CKMB, poc: 1.6 ng/mL (ref 1.0–8.0)
Myoglobin, poc: 109 ng/mL (ref 12–200)
Operator id: 133351
Troponin i, poc: <0.05 ng/mL (ref 0.00–0.05)

## 2011-12-08 DIAGNOSIS — M25549 Pain in joints of unspecified hand: Secondary | ICD-10-CM | POA: Diagnosis not present

## 2011-12-08 DIAGNOSIS — M719 Bursopathy, unspecified: Secondary | ICD-10-CM | POA: Diagnosis not present

## 2011-12-08 DIAGNOSIS — M76899 Other specified enthesopathies of unspecified lower limb, excluding foot: Secondary | ICD-10-CM | POA: Diagnosis not present

## 2011-12-08 DIAGNOSIS — M159 Polyosteoarthritis, unspecified: Secondary | ICD-10-CM | POA: Diagnosis not present

## 2011-12-08 DIAGNOSIS — M67919 Unspecified disorder of synovium and tendon, unspecified shoulder: Secondary | ICD-10-CM | POA: Diagnosis not present

## 2011-12-08 DIAGNOSIS — M25569 Pain in unspecified knee: Secondary | ICD-10-CM | POA: Diagnosis not present

## 2011-12-12 DIAGNOSIS — I10 Essential (primary) hypertension: Secondary | ICD-10-CM | POA: Diagnosis not present

## 2011-12-12 DIAGNOSIS — E782 Mixed hyperlipidemia: Secondary | ICD-10-CM | POA: Diagnosis not present

## 2011-12-12 DIAGNOSIS — M545 Low back pain: Secondary | ICD-10-CM | POA: Diagnosis not present

## 2011-12-12 DIAGNOSIS — E119 Type 2 diabetes mellitus without complications: Secondary | ICD-10-CM | POA: Diagnosis not present

## 2011-12-12 DIAGNOSIS — I2510 Atherosclerotic heart disease of native coronary artery without angina pectoris: Secondary | ICD-10-CM | POA: Diagnosis not present

## 2011-12-20 DIAGNOSIS — R39198 Other difficulties with micturition: Secondary | ICD-10-CM | POA: Diagnosis not present

## 2011-12-20 DIAGNOSIS — N401 Enlarged prostate with lower urinary tract symptoms: Secondary | ICD-10-CM | POA: Diagnosis not present

## 2011-12-20 DIAGNOSIS — R351 Nocturia: Secondary | ICD-10-CM | POA: Diagnosis not present

## 2011-12-20 DIAGNOSIS — R3911 Hesitancy of micturition: Secondary | ICD-10-CM | POA: Diagnosis not present

## 2011-12-23 DIAGNOSIS — M48061 Spinal stenosis, lumbar region without neurogenic claudication: Secondary | ICD-10-CM | POA: Diagnosis not present

## 2011-12-23 DIAGNOSIS — IMO0002 Reserved for concepts with insufficient information to code with codable children: Secondary | ICD-10-CM | POA: Diagnosis not present

## 2011-12-29 DIAGNOSIS — H2510 Age-related nuclear cataract, unspecified eye: Secondary | ICD-10-CM | POA: Diagnosis not present

## 2011-12-29 DIAGNOSIS — H52209 Unspecified astigmatism, unspecified eye: Secondary | ICD-10-CM | POA: Diagnosis not present

## 2012-01-10 ENCOUNTER — Other Ambulatory Visit: Payer: Self-pay | Admitting: Dermatology

## 2012-01-10 DIAGNOSIS — L578 Other skin changes due to chronic exposure to nonionizing radiation: Secondary | ICD-10-CM | POA: Diagnosis not present

## 2012-01-10 DIAGNOSIS — D0439 Carcinoma in situ of skin of other parts of face: Secondary | ICD-10-CM | POA: Diagnosis not present

## 2012-01-10 DIAGNOSIS — L820 Inflamed seborrheic keratosis: Secondary | ICD-10-CM | POA: Diagnosis not present

## 2012-01-10 DIAGNOSIS — D485 Neoplasm of uncertain behavior of skin: Secondary | ICD-10-CM | POA: Diagnosis not present

## 2012-01-10 DIAGNOSIS — C44320 Squamous cell carcinoma of skin of unspecified parts of face: Secondary | ICD-10-CM | POA: Diagnosis not present

## 2012-01-16 DIAGNOSIS — IMO0002 Reserved for concepts with insufficient information to code with codable children: Secondary | ICD-10-CM | POA: Diagnosis not present

## 2012-01-31 ENCOUNTER — Other Ambulatory Visit: Payer: Self-pay | Admitting: Dermatology

## 2012-01-31 DIAGNOSIS — C44319 Basal cell carcinoma of skin of other parts of face: Secondary | ICD-10-CM | POA: Diagnosis not present

## 2012-01-31 DIAGNOSIS — D0439 Carcinoma in situ of skin of other parts of face: Secondary | ICD-10-CM | POA: Diagnosis not present

## 2012-01-31 DIAGNOSIS — C44320 Squamous cell carcinoma of skin of unspecified parts of face: Secondary | ICD-10-CM | POA: Diagnosis not present

## 2012-02-03 DIAGNOSIS — IMO0002 Reserved for concepts with insufficient information to code with codable children: Secondary | ICD-10-CM | POA: Diagnosis not present

## 2012-02-16 DIAGNOSIS — R42 Dizziness and giddiness: Secondary | ICD-10-CM | POA: Diagnosis not present

## 2012-02-21 ENCOUNTER — Other Ambulatory Visit: Payer: Self-pay | Admitting: Neurology

## 2012-02-21 DIAGNOSIS — D72829 Elevated white blood cell count, unspecified: Secondary | ICD-10-CM

## 2012-02-21 DIAGNOSIS — R42 Dizziness and giddiness: Secondary | ICD-10-CM

## 2012-02-23 ENCOUNTER — Ambulatory Visit
Admission: RE | Admit: 2012-02-23 | Discharge: 2012-02-23 | Disposition: A | Discharge status: Home / Self Care | Payer: Medicare Other | Source: Ambulatory Visit | Attending: Neurology | Admitting: Neurology

## 2012-02-23 DIAGNOSIS — D72829 Elevated white blood cell count, unspecified: Secondary | ICD-10-CM | POA: Diagnosis not present

## 2012-02-23 DIAGNOSIS — R42 Dizziness and giddiness: Secondary | ICD-10-CM

## 2012-02-23 DIAGNOSIS — R51 Headache: Secondary | ICD-10-CM | POA: Diagnosis not present

## 2012-03-06 ENCOUNTER — Ambulatory Visit: Payer: Medicare Other | Admitting: Physical Therapy

## 2012-03-09 ENCOUNTER — Ambulatory Visit: Payer: Medicare Other | Admitting: Physical Therapy

## 2012-03-13 ENCOUNTER — Ambulatory Visit: Payer: Medicare Other | Admitting: Physical Therapy

## 2012-03-14 DIAGNOSIS — R079 Chest pain, unspecified: Secondary | ICD-10-CM | POA: Diagnosis not present

## 2012-03-14 DIAGNOSIS — R5383 Other fatigue: Secondary | ICD-10-CM | POA: Diagnosis not present

## 2012-03-14 DIAGNOSIS — R21 Rash and other nonspecific skin eruption: Secondary | ICD-10-CM | POA: Diagnosis not present

## 2012-03-14 DIAGNOSIS — M25569 Pain in unspecified knee: Secondary | ICD-10-CM | POA: Diagnosis not present

## 2012-03-15 ENCOUNTER — Ambulatory Visit: Payer: Medicare Other | Admitting: Physical Therapy

## 2012-03-21 DIAGNOSIS — R079 Chest pain, unspecified: Secondary | ICD-10-CM | POA: Diagnosis not present

## 2012-03-21 DIAGNOSIS — E291 Testicular hypofunction: Secondary | ICD-10-CM | POA: Diagnosis not present

## 2012-03-21 DIAGNOSIS — M25569 Pain in unspecified knee: Secondary | ICD-10-CM | POA: Diagnosis not present

## 2012-03-21 DIAGNOSIS — R21 Rash and other nonspecific skin eruption: Secondary | ICD-10-CM | POA: Diagnosis not present

## 2012-03-21 DIAGNOSIS — R918 Other nonspecific abnormal finding of lung field: Secondary | ICD-10-CM | POA: Diagnosis not present

## 2012-03-21 DIAGNOSIS — M1710 Unilateral primary osteoarthritis, unspecified knee: Secondary | ICD-10-CM | POA: Diagnosis not present

## 2012-03-22 ENCOUNTER — Other Ambulatory Visit: Payer: Self-pay | Admitting: Internal Medicine

## 2012-03-22 ENCOUNTER — Ambulatory Visit
Admission: RE | Admit: 2012-03-22 | Discharge: 2012-03-22 | Disposition: A | Discharge status: Home / Self Care | Payer: Medicare Other | Source: Ambulatory Visit | Attending: Internal Medicine | Admitting: Internal Medicine

## 2012-03-22 DIAGNOSIS — R918 Other nonspecific abnormal finding of lung field: Secondary | ICD-10-CM

## 2012-03-30 DIAGNOSIS — IMO0002 Reserved for concepts with insufficient information to code with codable children: Secondary | ICD-10-CM | POA: Diagnosis not present

## 2012-04-04 DIAGNOSIS — E119 Type 2 diabetes mellitus without complications: Secondary | ICD-10-CM | POA: Diagnosis not present

## 2012-04-04 DIAGNOSIS — E782 Mixed hyperlipidemia: Secondary | ICD-10-CM | POA: Diagnosis not present

## 2012-04-04 DIAGNOSIS — Z125 Encounter for screening for malignant neoplasm of prostate: Secondary | ICD-10-CM | POA: Diagnosis not present

## 2012-04-04 DIAGNOSIS — I10 Essential (primary) hypertension: Secondary | ICD-10-CM | POA: Diagnosis not present

## 2012-04-04 DIAGNOSIS — R5381 Other malaise: Secondary | ICD-10-CM | POA: Diagnosis not present

## 2012-04-04 DIAGNOSIS — R5383 Other fatigue: Secondary | ICD-10-CM | POA: Diagnosis not present

## 2012-04-11 DIAGNOSIS — H908 Mixed conductive and sensorineural hearing loss, unspecified: Secondary | ICD-10-CM | POA: Diagnosis not present

## 2012-04-11 DIAGNOSIS — E291 Testicular hypofunction: Secondary | ICD-10-CM | POA: Diagnosis not present

## 2012-04-11 DIAGNOSIS — I2510 Atherosclerotic heart disease of native coronary artery without angina pectoris: Secondary | ICD-10-CM | POA: Diagnosis not present

## 2012-04-11 DIAGNOSIS — M545 Low back pain: Secondary | ICD-10-CM | POA: Diagnosis not present

## 2012-04-11 DIAGNOSIS — R5383 Other fatigue: Secondary | ICD-10-CM | POA: Diagnosis not present

## 2012-04-11 DIAGNOSIS — R5381 Other malaise: Secondary | ICD-10-CM | POA: Diagnosis not present

## 2012-05-11 DIAGNOSIS — IMO0002 Reserved for concepts with insufficient information to code with codable children: Secondary | ICD-10-CM | POA: Diagnosis not present

## 2012-05-22 DIAGNOSIS — M1710 Unilateral primary osteoarthritis, unspecified knee: Secondary | ICD-10-CM | POA: Diagnosis not present

## 2012-06-05 DIAGNOSIS — B372 Candidiasis of skin and nail: Secondary | ICD-10-CM | POA: Diagnosis not present

## 2012-06-05 DIAGNOSIS — L570 Actinic keratosis: Secondary | ICD-10-CM | POA: Diagnosis not present

## 2012-06-05 DIAGNOSIS — Q828 Other specified congenital malformations of skin: Secondary | ICD-10-CM | POA: Diagnosis not present

## 2012-07-05 DIAGNOSIS — N320 Bladder-neck obstruction: Secondary | ICD-10-CM | POA: Diagnosis not present

## 2012-07-05 DIAGNOSIS — N401 Enlarged prostate with lower urinary tract symptoms: Secondary | ICD-10-CM | POA: Diagnosis not present

## 2012-07-06 DIAGNOSIS — G252 Other specified forms of tremor: Secondary | ICD-10-CM | POA: Diagnosis not present

## 2012-07-06 DIAGNOSIS — R42 Dizziness and giddiness: Secondary | ICD-10-CM | POA: Diagnosis not present

## 2012-07-06 DIAGNOSIS — R51 Headache: Secondary | ICD-10-CM | POA: Diagnosis not present

## 2012-07-06 DIAGNOSIS — M25569 Pain in unspecified knee: Secondary | ICD-10-CM | POA: Diagnosis not present

## 2012-07-06 DIAGNOSIS — G250 Essential tremor: Secondary | ICD-10-CM | POA: Diagnosis not present

## 2012-08-06 DIAGNOSIS — D692 Other nonthrombocytopenic purpura: Secondary | ICD-10-CM | POA: Diagnosis not present

## 2012-08-06 DIAGNOSIS — L821 Other seborrheic keratosis: Secondary | ICD-10-CM | POA: Diagnosis not present

## 2012-08-06 DIAGNOSIS — L570 Actinic keratosis: Secondary | ICD-10-CM | POA: Diagnosis not present

## 2012-08-06 DIAGNOSIS — B372 Candidiasis of skin and nail: Secondary | ICD-10-CM | POA: Diagnosis not present

## 2012-08-07 DIAGNOSIS — E119 Type 2 diabetes mellitus without complications: Secondary | ICD-10-CM | POA: Diagnosis not present

## 2012-08-07 DIAGNOSIS — R5383 Other fatigue: Secondary | ICD-10-CM | POA: Diagnosis not present

## 2012-08-07 DIAGNOSIS — R5381 Other malaise: Secondary | ICD-10-CM | POA: Diagnosis not present

## 2012-08-07 DIAGNOSIS — E782 Mixed hyperlipidemia: Secondary | ICD-10-CM | POA: Diagnosis not present

## 2012-08-07 DIAGNOSIS — I10 Essential (primary) hypertension: Secondary | ICD-10-CM | POA: Diagnosis not present

## 2012-08-14 DIAGNOSIS — R5383 Other fatigue: Secondary | ICD-10-CM | POA: Diagnosis not present

## 2012-08-14 DIAGNOSIS — I2510 Atherosclerotic heart disease of native coronary artery without angina pectoris: Secondary | ICD-10-CM | POA: Diagnosis not present

## 2012-08-14 DIAGNOSIS — E119 Type 2 diabetes mellitus without complications: Secondary | ICD-10-CM | POA: Diagnosis not present

## 2012-08-14 DIAGNOSIS — I10 Essential (primary) hypertension: Secondary | ICD-10-CM | POA: Diagnosis not present

## 2012-08-14 DIAGNOSIS — R5381 Other malaise: Secondary | ICD-10-CM | POA: Diagnosis not present

## 2012-08-29 DIAGNOSIS — Z9181 History of falling: Secondary | ICD-10-CM | POA: Diagnosis not present

## 2012-08-29 DIAGNOSIS — S4980XA Other specified injuries of shoulder and upper arm, unspecified arm, initial encounter: Secondary | ICD-10-CM | POA: Diagnosis not present

## 2012-08-29 DIAGNOSIS — M25519 Pain in unspecified shoulder: Secondary | ICD-10-CM | POA: Diagnosis not present

## 2012-10-02 DIAGNOSIS — I2510 Atherosclerotic heart disease of native coronary artery without angina pectoris: Secondary | ICD-10-CM | POA: Diagnosis not present

## 2012-10-02 DIAGNOSIS — Z23 Encounter for immunization: Secondary | ICD-10-CM | POA: Diagnosis not present

## 2012-10-02 DIAGNOSIS — M719 Bursopathy, unspecified: Secondary | ICD-10-CM | POA: Diagnosis not present

## 2012-10-02 DIAGNOSIS — M76899 Other specified enthesopathies of unspecified lower limb, excluding foot: Secondary | ICD-10-CM | POA: Diagnosis not present

## 2012-10-02 DIAGNOSIS — I10 Essential (primary) hypertension: Secondary | ICD-10-CM | POA: Diagnosis not present

## 2012-10-02 DIAGNOSIS — F39 Unspecified mood [affective] disorder: Secondary | ICD-10-CM | POA: Diagnosis not present

## 2012-10-02 DIAGNOSIS — M67919 Unspecified disorder of synovium and tendon, unspecified shoulder: Secondary | ICD-10-CM | POA: Diagnosis not present

## 2012-10-02 DIAGNOSIS — M159 Polyosteoarthritis, unspecified: Secondary | ICD-10-CM | POA: Diagnosis not present

## 2012-10-02 DIAGNOSIS — E119 Type 2 diabetes mellitus without complications: Secondary | ICD-10-CM | POA: Diagnosis not present

## 2012-10-29 DIAGNOSIS — I10 Essential (primary) hypertension: Secondary | ICD-10-CM | POA: Diagnosis not present

## 2012-10-29 DIAGNOSIS — E785 Hyperlipidemia, unspecified: Secondary | ICD-10-CM | POA: Diagnosis not present

## 2012-10-29 DIAGNOSIS — I2510 Atherosclerotic heart disease of native coronary artery without angina pectoris: Secondary | ICD-10-CM | POA: Diagnosis not present

## 2012-10-29 DIAGNOSIS — I5032 Chronic diastolic (congestive) heart failure: Secondary | ICD-10-CM | POA: Diagnosis not present

## 2012-11-05 DIAGNOSIS — I2510 Atherosclerotic heart disease of native coronary artery without angina pectoris: Secondary | ICD-10-CM | POA: Diagnosis not present

## 2012-11-05 DIAGNOSIS — M48061 Spinal stenosis, lumbar region without neurogenic claudication: Secondary | ICD-10-CM | POA: Diagnosis not present

## 2012-11-05 DIAGNOSIS — I10 Essential (primary) hypertension: Secondary | ICD-10-CM | POA: Diagnosis not present

## 2012-11-05 DIAGNOSIS — M549 Dorsalgia, unspecified: Secondary | ICD-10-CM | POA: Diagnosis not present

## 2012-11-29 DIAGNOSIS — R079 Chest pain, unspecified: Secondary | ICD-10-CM | POA: Diagnosis not present

## 2012-11-29 DIAGNOSIS — IMO0001 Reserved for inherently not codable concepts without codable children: Secondary | ICD-10-CM | POA: Diagnosis not present

## 2012-11-29 DIAGNOSIS — W19XXXA Unspecified fall, initial encounter: Secondary | ICD-10-CM | POA: Diagnosis not present

## 2012-12-24 DIAGNOSIS — E119 Type 2 diabetes mellitus without complications: Secondary | ICD-10-CM | POA: Diagnosis not present

## 2012-12-24 DIAGNOSIS — I2510 Atherosclerotic heart disease of native coronary artery without angina pectoris: Secondary | ICD-10-CM | POA: Diagnosis not present

## 2012-12-24 DIAGNOSIS — I10 Essential (primary) hypertension: Secondary | ICD-10-CM | POA: Diagnosis not present

## 2012-12-27 DIAGNOSIS — E119 Type 2 diabetes mellitus without complications: Secondary | ICD-10-CM | POA: Diagnosis not present

## 2012-12-27 DIAGNOSIS — I2510 Atherosclerotic heart disease of native coronary artery without angina pectoris: Secondary | ICD-10-CM | POA: Diagnosis not present

## 2012-12-27 DIAGNOSIS — I10 Essential (primary) hypertension: Secondary | ICD-10-CM | POA: Diagnosis not present

## 2012-12-27 DIAGNOSIS — E782 Mixed hyperlipidemia: Secondary | ICD-10-CM | POA: Diagnosis not present

## 2013-02-21 DIAGNOSIS — E782 Mixed hyperlipidemia: Secondary | ICD-10-CM | POA: Diagnosis not present

## 2013-02-21 DIAGNOSIS — E119 Type 2 diabetes mellitus without complications: Secondary | ICD-10-CM | POA: Diagnosis not present

## 2013-02-21 DIAGNOSIS — I10 Essential (primary) hypertension: Secondary | ICD-10-CM | POA: Diagnosis not present

## 2013-02-21 DIAGNOSIS — I2510 Atherosclerotic heart disease of native coronary artery without angina pectoris: Secondary | ICD-10-CM | POA: Diagnosis not present

## 2013-03-08 DIAGNOSIS — IMO0002 Reserved for concepts with insufficient information to code with codable children: Secondary | ICD-10-CM | POA: Diagnosis not present

## 2013-03-14 DIAGNOSIS — E782 Mixed hyperlipidemia: Secondary | ICD-10-CM | POA: Diagnosis not present

## 2013-03-14 DIAGNOSIS — I2510 Atherosclerotic heart disease of native coronary artery without angina pectoris: Secondary | ICD-10-CM | POA: Diagnosis not present

## 2013-03-14 DIAGNOSIS — L0233 Carbuncle of buttock: Secondary | ICD-10-CM | POA: Diagnosis not present

## 2013-03-14 DIAGNOSIS — I10 Essential (primary) hypertension: Secondary | ICD-10-CM | POA: Diagnosis not present

## 2013-04-10 DIAGNOSIS — L988 Other specified disorders of the skin and subcutaneous tissue: Secondary | ICD-10-CM | POA: Diagnosis not present

## 2013-04-10 DIAGNOSIS — F39 Unspecified mood [affective] disorder: Secondary | ICD-10-CM | POA: Diagnosis not present

## 2013-04-10 DIAGNOSIS — K140 Glossitis: Secondary | ICD-10-CM | POA: Diagnosis not present

## 2013-04-10 DIAGNOSIS — IMO0001 Reserved for inherently not codable concepts without codable children: Secondary | ICD-10-CM | POA: Diagnosis not present

## 2013-04-13 ENCOUNTER — Emergency Department (HOSPITAL_BASED_OUTPATIENT_CLINIC_OR_DEPARTMENT_OTHER)
Admission: EM | Admit: 2013-04-13 | Discharge: 2013-04-13 | Disposition: A | Discharge status: Home / Self Care | Payer: Medicare Other | Attending: Emergency Medicine | Admitting: Emergency Medicine

## 2013-04-13 ENCOUNTER — Encounter (HOSPITAL_BASED_OUTPATIENT_CLINIC_OR_DEPARTMENT_OTHER): Payer: Self-pay | Admitting: *Deleted

## 2013-04-13 ENCOUNTER — Emergency Department (HOSPITAL_BASED_OUTPATIENT_CLINIC_OR_DEPARTMENT_OTHER): Payer: Medicare Other

## 2013-04-13 DIAGNOSIS — M6281 Muscle weakness (generalized): Secondary | ICD-10-CM | POA: Diagnosis not present

## 2013-04-13 DIAGNOSIS — Z8669 Personal history of other diseases of the nervous system and sense organs: Secondary | ICD-10-CM | POA: Insufficient documentation

## 2013-04-13 DIAGNOSIS — S199XXA Unspecified injury of neck, initial encounter: Secondary | ICD-10-CM | POA: Insufficient documentation

## 2013-04-13 DIAGNOSIS — F329 Major depressive disorder, single episode, unspecified: Secondary | ICD-10-CM | POA: Insufficient documentation

## 2013-04-13 DIAGNOSIS — S0083XA Contusion of other part of head, initial encounter: Secondary | ICD-10-CM | POA: Diagnosis not present

## 2013-04-13 DIAGNOSIS — S0340XA Sprain of jaw, unspecified side, initial encounter: Secondary | ICD-10-CM | POA: Insufficient documentation

## 2013-04-13 DIAGNOSIS — S0993XA Unspecified injury of face, initial encounter: Secondary | ICD-10-CM | POA: Diagnosis not present

## 2013-04-13 DIAGNOSIS — S0003XA Contusion of scalp, initial encounter: Secondary | ICD-10-CM | POA: Diagnosis not present

## 2013-04-13 DIAGNOSIS — Z8739 Personal history of other diseases of the musculoskeletal system and connective tissue: Secondary | ICD-10-CM | POA: Insufficient documentation

## 2013-04-13 DIAGNOSIS — T07XXXA Unspecified multiple injuries, initial encounter: Secondary | ICD-10-CM | POA: Diagnosis not present

## 2013-04-13 DIAGNOSIS — R296 Repeated falls: Secondary | ICD-10-CM | POA: Insufficient documentation

## 2013-04-13 DIAGNOSIS — S59909A Unspecified injury of unspecified elbow, initial encounter: Secondary | ICD-10-CM | POA: Diagnosis not present

## 2013-04-13 DIAGNOSIS — Z8679 Personal history of other diseases of the circulatory system: Secondary | ICD-10-CM | POA: Insufficient documentation

## 2013-04-13 DIAGNOSIS — S0990XA Unspecified injury of head, initial encounter: Secondary | ICD-10-CM | POA: Insufficient documentation

## 2013-04-13 DIAGNOSIS — S6990XA Unspecified injury of unspecified wrist, hand and finger(s), initial encounter: Secondary | ICD-10-CM | POA: Insufficient documentation

## 2013-04-13 DIAGNOSIS — Z23 Encounter for immunization: Secondary | ICD-10-CM | POA: Diagnosis not present

## 2013-04-13 DIAGNOSIS — G8929 Other chronic pain: Secondary | ICD-10-CM | POA: Insufficient documentation

## 2013-04-13 DIAGNOSIS — I10 Essential (primary) hypertension: Secondary | ICD-10-CM | POA: Insufficient documentation

## 2013-04-13 DIAGNOSIS — Z79899 Other long term (current) drug therapy: Secondary | ICD-10-CM | POA: Diagnosis not present

## 2013-04-13 DIAGNOSIS — M549 Dorsalgia, unspecified: Secondary | ICD-10-CM | POA: Diagnosis not present

## 2013-04-13 DIAGNOSIS — R209 Unspecified disturbances of skin sensation: Secondary | ICD-10-CM | POA: Diagnosis not present

## 2013-04-13 DIAGNOSIS — E119 Type 2 diabetes mellitus without complications: Secondary | ICD-10-CM | POA: Diagnosis not present

## 2013-04-13 DIAGNOSIS — G4733 Obstructive sleep apnea (adult) (pediatric): Secondary | ICD-10-CM | POA: Insufficient documentation

## 2013-04-13 DIAGNOSIS — IMO0002 Reserved for concepts with insufficient information to code with codable children: Secondary | ICD-10-CM | POA: Insufficient documentation

## 2013-04-13 DIAGNOSIS — T1490XA Injury, unspecified, initial encounter: Secondary | ICD-10-CM | POA: Diagnosis not present

## 2013-04-13 DIAGNOSIS — Y929 Unspecified place or not applicable: Secondary | ICD-10-CM | POA: Insufficient documentation

## 2013-04-13 DIAGNOSIS — R42 Dizziness and giddiness: Secondary | ICD-10-CM | POA: Diagnosis not present

## 2013-04-13 DIAGNOSIS — F3289 Other specified depressive episodes: Secondary | ICD-10-CM | POA: Insufficient documentation

## 2013-04-13 DIAGNOSIS — Y939 Activity, unspecified: Secondary | ICD-10-CM | POA: Insufficient documentation

## 2013-04-13 DIAGNOSIS — F0781 Postconcussional syndrome: Secondary | ICD-10-CM | POA: Insufficient documentation

## 2013-04-13 HISTORY — DX: Atherosclerotic heart disease of native coronary artery without angina pectoris: I25.10

## 2013-04-13 HISTORY — DX: Type 2 diabetes mellitus without complications: E11.9

## 2013-04-13 HISTORY — DX: Elevated white blood cell count, unspecified: D72.829

## 2013-04-13 HISTORY — DX: Dizziness and giddiness: R42

## 2013-04-13 HISTORY — DX: Spinal stenosis, lumbar region without neurogenic claudication: M48.061

## 2013-04-13 HISTORY — DX: Essential tremor: G25.0

## 2013-04-13 HISTORY — DX: Headache: R51

## 2013-04-13 HISTORY — DX: Reserved for concepts with insufficient information to code with codable children: IMO0002

## 2013-04-13 HISTORY — DX: Other specified depressive episodes: F32.89

## 2013-04-13 HISTORY — DX: Obstructive sleep apnea (adult) (pediatric): G47.33

## 2013-04-13 HISTORY — DX: Essential tremor: G25.2

## 2013-04-13 HISTORY — DX: Essential (primary) hypertension: I10

## 2013-04-13 HISTORY — DX: Major depressive disorder, single episode, unspecified: F32.9

## 2013-04-13 HISTORY — DX: Pain in unspecified knee: M25.569

## 2013-04-13 MED ORDER — TETANUS-DIPHTH-ACELL PERTUSSIS 5-2.5-18.5 LF-MCG/0.5 IM SUSP
0.5000 mL | Freq: Once | INTRAMUSCULAR | Status: AC
  Administered 2013-04-13: 0.5 mL via INTRAMUSCULAR
  Filled 2013-04-13: qty 0.5

## 2013-04-13 MED ORDER — FENTANYL CITRATE 0.05 MG/ML IJ SOLN
100.0000 ug | Freq: Once | INTRAMUSCULAR | Status: AC
  Administered 2013-04-13: 100 ug via NASAL
  Filled 2013-04-13: qty 2

## 2013-04-13 NOTE — ED Notes (Signed)
Pt d/c home with ride- cao x 4

## 2013-04-13 NOTE — ED Provider Notes (Signed)
History    This chart was scribed for Hurman Horn, MD, by Frederik Pear, ED scribe. The patient was seen in room MH05/MH05 and the patient's care was started at 1555.    CSN: [id number]  Arrival date & time 04/13/13  1546   First MD Initiated Contact with Patient 04/13/13 1555      Chief Complaint  Patient presents with  . Fall    (Consider location/radiation/quality/duration/timing/severity/associated sxs/prior treatment) The history is provided by the patient and medical records. No language interpreter was used.   HPI Comments: Jorge Sherman is a 72 y.o. Male with a h/o of DM, hypertension, depression, chronic back pain, and chronic numbness and weakness of the left leg, who presents to the Emergency Department complaining of a fall from a standing position where hit the back of his head and jarred his neck upon impact with a concrete floor. He denies LOC or amnesia. He states that he has a h/o of becoming offbalance if he looks upward for too long, which was the cause of his fall today. In ED, he complains of moderate, constant, non-radiating, severe pain with a hematoma and abrasion to the back of his head, left TMJ, and left elbow pain. He reports that he is more unsteady than baseline since the fall and has a slight, intermittent feeling of Vertigo. At home, he ambulates with a cane at baseline. He denies neck pain, SOB, CP, abdominal pain, a global HA, new focal weakness, and speech or vision impairments. He has a h/o of chronic back pain as well as 3 previous surgeries, but states that his current back pain is baseline. He is unsure of his last tetanus.  Past Medical History  Diagnosis Date  . Dizziness and giddiness   . Pain in joint, lower leg   . Unspecified essential hypertension   . Leukocytosis, unspecified   . Type II or unspecified type diabetes mellitus without mention of complication, not stated as uncontrolled   . Obstructive sleep apnea (adult) (pediatric)   .  Depressive disorder, not elsewhere classified   . Essential and other specified forms of tremor   . Unspecified cardiovascular disease   . Headache   . Spinal stenosis, lumbar region, without neurogenic claudication   . Thoracic or lumbosacral neuritis or radiculitis, unspecified     History reviewed. No pertinent past surgical history.  No family history on file.  History  Substance Use Topics  . Smoking status: Not on file  . Smokeless tobacco: Not on file  . Alcohol Use: Not on file      Review of Systems A complete 10 system review of systems was obtained and all systems are negative except as noted in the HPI and PMH.  Allergies  Iohexol  Home Medications   Current Outpatient Rx  Name  Route  Sig  Dispense  Refill  . aspirin 325 MG tablet   Oral   Take 325 mg by mouth daily.         . celecoxib (CELEBREX) 200 MG capsule   Oral   Take 200 mg by mouth 2 (two) times daily.         . diazepam (VALIUM) 10 MG tablet   Oral   Take 10 mg by mouth 2 (two) times daily.         [name] DOCUSATE SODIUM PO   Oral   Take 1 capsule by mouth daily.         . DULoxetine (CYMBALTA) 30 MG  capsule   Oral   Take 90 mg by mouth daily.         [name] ezetimibe (ZETIA) 10 MG tablet   Oral   Take 10 mg by mouth daily.         . furosemide (LASIX) 20 MG tablet   Oral   Take 20 mg by mouth daily.         [name] gabapentin (NEURONTIN) 800 MG tablet   Oral   Take 800 mg by mouth 4 (four) times daily.         [name] lidocaine (LIDODERM) 5 %   Transdermal   Place 3 patches onto the skin daily. Remove & Discard patch within 12 hours or as directed by MD         . lisinopril (PRINIVIL,ZESTRIL) 40 MG tablet   Oral   Take 40 mg by mouth daily.         [name] LORazepam (ATIVAN) 1 MG tablet   Oral   Take 2 mg by mouth at bedtime as needed for anxiety.         . metFORMIN (GLUCOPHAGE) 500 MG tablet   Oral   Take 500 mg by mouth daily with breakfast.         . methocarbamol  (ROBAXIN) 500 MG tablet   Oral   Take 500 mg by mouth 3 (three) times daily.         . multivitamin (METANX) 3-35-2 MG TABS tablet   Oral   Take 1 tablet by mouth 2 (two) times daily.         [name] omeprazole-sodium bicarbonate (ZEGERID) 40-1100 MG per capsule   Oral   Take 1 capsule by mouth 2 (two) times daily.         [name] oxyCODONE (OXYCONTIN) 80 MG 12 hr tablet   Oral   Take 80 mg by mouth every 12 (twelve) hours.         . OXYCODONE HCL PO   Oral   Take 15 mg by mouth. Two tabs twice daily         . rosuvastatin (CRESTOR) 20 MG tablet   Oral   Take 20 mg by mouth daily.         [name] senna (SENOKOT) 8.6 MG TABS   Oral   Take 1 tablet by mouth 4 (four) times daily.         . tamsulosin (FLOMAX) 0.4 MG CAPS   Oral   Take 0.8 mg by mouth daily.         . traZODone (DESYREL) 50 MG tablet   Oral   Take 100 mg by mouth at bedtime.           BP 148/62  Pulse 77  Temp(Src) 98.2 F (36.8 C) (Oral)  Resp 16  SpO2 99%  Physical Exam  Nursing note and vitals reviewed. Constitutional: He is oriented to person, place, and time.  Awake, alert, nontoxic appearance with baseline speech for patient.  HENT:  Mouth/Throat: No oropharyngeal exudate.  Occipital scalp hematoma with a hemostatic abrasion. Mild tenderness to left TMJ. No trismus. No malocclusion.   Eyes: EOM are normal. Pupils are equal, round, and reactive to light. Right eye exhibits no discharge. Left eye exhibits no discharge.  Neck: Neck supple. Muscular tenderness present. No spinous process tenderness present.  Mld bilateral tenderness to the soft tissues of the neck without underlying bony or joint tenderness.  Cardiovascular: Normal rate, regular rhythm, normal heart sounds and  intact distal pulses.  Exam reveals no gallop and no friction rub.   No murmur heard. Pulmonary/Chest: Effort normal and breath sounds normal. No stridor. No respiratory distress. He has no wheezes. He has no rales. He  exhibits no tenderness.  Abdominal: Soft. Bowel sounds are normal. He exhibits no distension and no mass. There is no tenderness. There is no rebound and no guarding.  Musculoskeletal: He exhibits no tenderness.       Cervical back: He exhibits no tenderness and no bony tenderness.       Thoracic back: He exhibits no tenderness.       Lumbar back: He exhibits no tenderness.  Baseline ROM, moves extremities with no obvious new focal weakness. No midline tenderness.   Lymphadenopathy:    He has no cervical adenopathy.  Neurological: He is alert and oriented to person, place, and time. No cranial nerve deficit.  Awake, alert, cooperative and aware of situation; motor strength bilaterally; sensation normal to light touch bilaterally; peripheral visual fields full to confrontation; no facial asymmetry; tongue midline; major cranial nerves appear intact; no pronator drift, normal finger to nose bilaterally, slightly unsteady gait without some ataxia that is worse than usual.  Skin: Abrasion noted. No rash noted.  Bilateral superficial elbow abrasions  Psychiatric: He has a normal mood and affect.    ED Course  Procedures (including critical care time)  DIAGNOSTIC STUDIES: Oxygen Saturation is 100% on room air, normal by my interpretation.    COORDINATION OF CARE:  16:10- Patient understands and agrees with initial ED impression and plan, which includes fentanyl, Boostrix, and a maxillofacial and head CT,  with expectations set for ED visit.  16:30- Medication Orders- fentanyl (sublimaze) injection 100 mcg- once, TDaP (boostrix) injection 0.5 mL- once.  17:48- Pt stable in ED with no significant deterioration in condition. Patient / Family / Caregiver informed of clinical course, understand medical decision-making process, and agree with plan.  Labs Reviewed - No data to display Ct Head Wo Contrast  04/13/2013   *RADIOLOGY REPORT*  Clinical Data:  Post fall striking back of head, now with  left- sided TMJ pain, history of dementia  CT HEAD WITHOUT CONTRAST CT MAXILLOFACIAL WITHOUT CONTRAST  Technique:  Multidetector CT imaging of the head and maxillofacial structures were performed using the standard protocol without intravenous contrast. Multiplanar CT image reconstructions of the maxillofacial structures were also generated.  Comparison:  05/13/2008; brain MRI - 05/07/2008; sinus CT - 02/22/2029  CT HEAD  Findings:  There is soft tissue swelling about the left side of the posterior parietal calvarium and occiput (images 24 through 27, series 2). This finding is without associated displaced calvarial fracture. No radiopaque foreign body.  There is grossly unchanged mild diffuse volume loss with sulcal prominence and mild ex vacuo dilatation of the ventricular system. Scattered periventricular hypodensities are grossly unchanged and again compatible with microvascular ischemic disease.  No CT evidence of acute large territory infarct.  No intraparenchymal or extra-axial mass or hemorrhage.  Unchanged size configuration of the ventricles and basilar cisterns.  No midline shift. Vascular calcifications of the bilateral carotid siphons.  IMPRESSION: 1.  Soft tissue swelling about the left posterior skull without associated displaced calvarial fracture or acute intracranial process  2. Unchanged findings of atrophy and microvascular ischemic disease.  CT MAXILLOFACIAL  Findings:  No displaced facial fracture. Regional soft tissues are normal.  No radiopaque foreign body.  Mild leftward deviation of the nasal septum.  The paranasal sinuses and mastoid  air cells are normally aerated.  Normal appearance of the mandible. Both mandibular condyles are normally located. Normal appearance of the bilateral zygomatic arches and pterygoid plates.  Limited visualization of the superior aspect of the cervical spine demonstrates moderate to severe DDD of the atlantodental articulation.  The dens is normally positioned  and the lateral masses of C1.  Mild to moderate multilevel DDD, worse at C5 - C6 with disc space height loss, end plate irregularity and sclerosis.  IMPRESSION: 1.  No displaced facial fracture. 2.  Degenerative changes of the imaged cervical spine.   Original Report Authenticated By: Tacey Ruiz, MD   Ct Maxillofacial Wo Cm  04/13/2013   *RADIOLOGY REPORT*  Clinical Data:  Post fall striking back of head, now with left- sided TMJ pain, history of dementia  CT HEAD WITHOUT CONTRAST CT MAXILLOFACIAL WITHOUT CONTRAST  Technique:  Multidetector CT imaging of the head and maxillofacial structures were performed using the standard protocol without intravenous contrast. Multiplanar CT image reconstructions of the maxillofacial structures were also generated.  Comparison:  05/13/2008; brain MRI - 05/07/2008; sinus CT - 02/22/2029  CT HEAD  Findings:  There is soft tissue swelling about the left side of the posterior parietal calvarium and occiput (images 24 through 27, series 2). This finding is without associated displaced calvarial fracture. No radiopaque foreign body.  There is grossly unchanged mild diffuse volume loss with sulcal prominence and mild ex vacuo dilatation of the ventricular system. Scattered periventricular hypodensities are grossly unchanged and again compatible with microvascular ischemic disease.  No CT evidence of acute large territory infarct.  No intraparenchymal or extra-axial mass or hemorrhage.  Unchanged size configuration of the ventricles and basilar cisterns.  No midline shift. Vascular calcifications of the bilateral carotid siphons.  IMPRESSION: 1.  Soft tissue swelling about the left posterior skull without associated displaced calvarial fracture or acute intracranial process  2. Unchanged findings of atrophy and microvascular ischemic disease.  CT MAXILLOFACIAL  Findings:  No displaced facial fracture. Regional soft tissues are normal.  No radiopaque foreign body.  Mild leftward  deviation of the nasal septum.  The paranasal sinuses and mastoid air cells are normally aerated.  Normal appearance of the mandible. Both mandibular condyles are normally located. Normal appearance of the bilateral zygomatic arches and pterygoid plates.  Limited visualization of the superior aspect of the cervical spine demonstrates moderate to severe DDD of the atlantodental articulation.  The dens is normally positioned and the lateral masses of C1.  Mild to moderate multilevel DDD, worse at C5 - C6 with disc space height loss, end plate irregularity and sclerosis.  IMPRESSION: 1.  No displaced facial fracture. 2.  Degenerative changes of the imaged cervical spine.   Original Report Authenticated By: Tacey Ruiz, MD    1. Minor head injury, initial encounter   2. Scalp hematoma, initial encounter   3. Postconcussive syndrome   4. Abrasions of multiple sites   5. TMJ (sprain of temporomandibular joint), initial encounter       MDM  I personally performed the services described in this documentation, which was scribed in my presence. The recorded information has been reviewed and is accurate. I doubt any other EMC precluding discharge at this time including, but not necessarily limited to the following:ICH, CSI.        Hurman Horn, MD 04/15/13 1536

## 2013-04-13 NOTE — ED Notes (Addendum)
MD at bedside to assess pt upon arrival

## 2013-04-13 NOTE — ED Notes (Signed)
Pt reports from standing on to concrete floor- cao x 4- denies loc

## 2013-04-13 NOTE — ED Notes (Signed)
Patient transported to CT 

## 2013-04-19 ENCOUNTER — Telehealth: Payer: Self-pay | Admitting: *Deleted

## 2013-04-19 NOTE — Telephone Encounter (Signed)
Larey Seat and hit his head.   Was examined by a doctor and was told he was ok but he should follow up with his neurologist.  He has not been seen since Dr. Sandria Manly left but feels he should be seen.  Who could he see?  He wants to see the most senior MD here.  Please call.  States he also needs a refill on his Oxycontin 80mg  every 12 hours and Oxycodone 5 or 6 per day.  Please prepare prescriptions and he will pick those up so he can mail for refill.

## 2013-04-19 NOTE — Telephone Encounter (Signed)
Information was given to Wilkerson to assign and send to scheduling.

## 2013-04-22 ENCOUNTER — Telehealth: Payer: Self-pay | Admitting: Neurology

## 2013-04-22 NOTE — Telephone Encounter (Signed)
I called and spoke with the patient about scheduling an appt. with Dr. Hosie Poisson on 06-11-13@12 . Patient also had concerns about his medication bein refilled , I informe the patient that Shanda Bumps will check into when his refill is done.

## 2013-04-22 NOTE — Telephone Encounter (Signed)
Dr Sandria Manly was asked about this patient; he was referred to a pain clinic by dr Sandria Manly and should get his meds form there.  The patient was not told that he would increase the dose or daily intake.  Tasheka Houseman, MD

## 2013-04-22 NOTE — Telephone Encounter (Signed)
Patient has been assigned to Dr Hosie Poisson, who does not begin working here until July 14th.  I have to send the request to Dr Vickey Huger, [facility].  Patient called 2 more times  saying he MUST have his pain meds and if he misses even one day he will "go into shock and have withdrawals".  He is a former [facility] patient who has been taking Oxycontin 80mg  one twice daily along with Oxycodone 15mg  two twice daily.  Patient claims Dr Sandria Manly was going to increase his Oxycodone 15mg  to five tabs daily rather than four daily, but he must have forgotten to do so before he retired.  Patient exclaimed it is imperative that he get these Rx's ASAP.  Says he is really stressing out over this and if he does not get his medication he will likely have to be hospitalized.  Says he is in terrible pain on a daily basis.  Rx last written by Dr Sandria Manly on 02/06/2013.  Patient always gets a 90 day supply and always requests meds approximately 3 weeks early because he has to get them via mail order.  Would like a Rx written right away, because he will be out before appt.  WID, please advise.  Thank you.

## 2013-04-23 DIAGNOSIS — M545 Low back pain: Secondary | ICD-10-CM | POA: Diagnosis not present

## 2013-04-23 DIAGNOSIS — E119 Type 2 diabetes mellitus without complications: Secondary | ICD-10-CM | POA: Diagnosis not present

## 2013-04-23 DIAGNOSIS — L723 Sebaceous cyst: Secondary | ICD-10-CM | POA: Diagnosis not present

## 2013-04-23 DIAGNOSIS — M159 Polyosteoarthritis, unspecified: Secondary | ICD-10-CM | POA: Diagnosis not present

## 2013-04-23 NOTE — Telephone Encounter (Signed)
I called and spoke with patient for a VERY long time.  I explained he was referred to a pain clinic by Dr Sandria Manly.  Patient says he never went to pain clinic, and did not know he was being referred, as he told Dr Sandria Manly he just wanted to see him and continue getting Rx's from him.  Patient says if we do not give him his medication we are "signing his death certificate".  Patient states he is in "terrible condition" and getting these meds are literally a matter of "life or death".  Says he "cannot go a day without medication".  Claims the medication is "the only thing keeping him alive".  I suggested he contact his PCP or if the situation is an emergency he should go to Urgent care or the ED.  States he has enough meds to last until 06/25.  (He has an appt with Dr Hosie Poisson on 07/22).  I again suggested he contact PCP to see if they can prescribe anything for him.  He says he feels we should be reported to the newspaper for not giving him his medication.  He wants me to check on his referral and have someone call him back.  Since I am not familiar with the referral, I will check with Annabelle Harman and see what info she can provide.

## 2013-05-09 ENCOUNTER — Telehealth: Payer: Self-pay | Admitting: *Deleted

## 2013-05-09 NOTE — Telephone Encounter (Signed)
Message copied by Avie Echevaria on Thu May 09, 2013 10:31 AM ------      Message from: Arther Abbott B      Created: Wed May 08, 2013 12:29 PM      Contact: Pt Jorge Sherman       Pt called states he has fallen in the past 3 weeks and would like for someone to call him. He has an apt with Dr. Hosie Poisson in July  ------

## 2013-05-09 NOTE — Telephone Encounter (Signed)
Left vm on pt's home phone number stating that he has been referred to [facility] ([facility]) and that once the appointment has been confirmed, his Rx's would be filled so that he will have enough until his appointment date with [facility].  I requested a call back once this appointment has been made so that we may refill his Rx's.   I have contacted [facility] at [phone] and confirmed receipt of Jorge Sherman referral with Tresa Endo on 05-09-2013 at 10:25 AM.

## 2013-06-07 ENCOUNTER — Encounter: Payer: Self-pay | Admitting: Neurology

## 2013-06-11 ENCOUNTER — Ambulatory Visit: Payer: Self-pay | Admitting: Neurology

## 2013-06-23 ENCOUNTER — Emergency Department (HOSPITAL_BASED_OUTPATIENT_CLINIC_OR_DEPARTMENT_OTHER)
Admission: EM | Admit: 2013-06-23 | Discharge: 2013-06-23 | Disposition: A | Discharge status: Home / Self Care | Payer: Medicare Other | Attending: Emergency Medicine | Admitting: Emergency Medicine

## 2013-06-23 ENCOUNTER — Encounter (HOSPITAL_BASED_OUTPATIENT_CLINIC_OR_DEPARTMENT_OTHER): Payer: Self-pay | Admitting: *Deleted

## 2013-06-23 DIAGNOSIS — M549 Dorsalgia, unspecified: Secondary | ICD-10-CM | POA: Diagnosis not present

## 2013-06-23 DIAGNOSIS — R079 Chest pain, unspecified: Secondary | ICD-10-CM | POA: Diagnosis not present

## 2013-06-23 DIAGNOSIS — E119 Type 2 diabetes mellitus without complications: Secondary | ICD-10-CM | POA: Diagnosis not present

## 2013-06-23 DIAGNOSIS — M79609 Pain in unspecified limb: Secondary | ICD-10-CM | POA: Diagnosis not present

## 2013-06-23 DIAGNOSIS — Z8669 Personal history of other diseases of the nervous system and sense organs: Secondary | ICD-10-CM | POA: Insufficient documentation

## 2013-06-23 DIAGNOSIS — F329 Major depressive disorder, single episode, unspecified: Secondary | ICD-10-CM | POA: Insufficient documentation

## 2013-06-23 DIAGNOSIS — R209 Unspecified disturbances of skin sensation: Secondary | ICD-10-CM | POA: Diagnosis not present

## 2013-06-23 DIAGNOSIS — R42 Dizziness and giddiness: Secondary | ICD-10-CM | POA: Insufficient documentation

## 2013-06-23 DIAGNOSIS — G8929 Other chronic pain: Secondary | ICD-10-CM | POA: Insufficient documentation

## 2013-06-23 DIAGNOSIS — Z8679 Personal history of other diseases of the circulatory system: Secondary | ICD-10-CM | POA: Diagnosis not present

## 2013-06-23 DIAGNOSIS — Z8739 Personal history of other diseases of the musculoskeletal system and connective tissue: Secondary | ICD-10-CM | POA: Diagnosis not present

## 2013-06-23 DIAGNOSIS — R52 Pain, unspecified: Secondary | ICD-10-CM

## 2013-06-23 DIAGNOSIS — M79605 Pain in left leg: Secondary | ICD-10-CM

## 2013-06-23 DIAGNOSIS — Z862 Personal history of diseases of the blood and blood-forming organs and certain disorders involving the immune mechanism: Secondary | ICD-10-CM | POA: Diagnosis not present

## 2013-06-23 DIAGNOSIS — I10 Essential (primary) hypertension: Secondary | ICD-10-CM | POA: Diagnosis not present

## 2013-06-23 DIAGNOSIS — F3289 Other specified depressive episodes: Secondary | ICD-10-CM | POA: Insufficient documentation

## 2013-06-23 DIAGNOSIS — Z79899 Other long term (current) drug therapy: Secondary | ICD-10-CM | POA: Diagnosis not present

## 2013-06-23 DIAGNOSIS — Z7982 Long term (current) use of aspirin: Secondary | ICD-10-CM | POA: Insufficient documentation

## 2013-06-23 HISTORY — DX: Other chronic pain: G89.29

## 2013-06-23 LAB — CBC WITH DIFFERENTIAL/PLATELET
Basophils Absolute: 0 10*3/uL (ref 0.0–0.1)
Basophils Relative: 0 % (ref 0–1)
Eosinophils Absolute: 0.2 10*3/uL (ref 0.0–0.7)
Eosinophils Relative: 2 % (ref 0–5)
Lymphocytes Relative: 11 % — ABNORMAL LOW (ref 12–46)
MCH: 28.5 pg (ref 26.0–34.0)
MCHC: 33.7 g/dL (ref 30.0–36.0)
MCV: 84.6 fL (ref 78.0–100.0)
Platelets: 243 10*3/uL (ref 150–400)
RDW: 12.9 % (ref 11.5–15.5)
WBC: 11.5 10*3/uL — ABNORMAL HIGH (ref 4.0–10.5)

## 2013-06-23 LAB — COMPREHENSIVE METABOLIC PANEL
ALT: 17 U/L (ref 0–53)
AST: 22 U/L (ref 0–37)
Albumin: 4.2 g/dL (ref 3.5–5.2)
Calcium: 9.9 mg/dL (ref 8.4–10.5)
Creatinine, Ser: 0.8 mg/dL (ref 0.50–1.35)
Sodium: 127 mEq/L — ABNORMAL LOW (ref 135–145)
Total Protein: 7 g/dL (ref 6.0–8.3)

## 2013-06-23 MED ORDER — ENOXAPARIN SODIUM 150 MG/ML ~~LOC~~ SOLN
1.0000 mg/kg | Freq: Once | SUBCUTANEOUS | Status: AC
  Administered 2013-06-23: via SUBCUTANEOUS

## 2013-06-23 MED ORDER — ENOXAPARIN SODIUM 100 MG/ML ~~LOC~~ SOLN
SUBCUTANEOUS | Status: AC
  Administered 2013-06-23: 100 mg
  Filled 2013-06-23: qty 1

## 2013-06-23 MED ORDER — HYDROMORPHONE HCL PF 1 MG/ML IJ SOLN
1.0000 mg | Freq: Once | INTRAMUSCULAR | Status: AC
  Administered 2013-06-23: 1 mg via INTRAVENOUS
  Filled 2013-06-23: qty 1

## 2013-06-23 NOTE — ED Provider Notes (Signed)
CSN: [id number]     Arrival date & time 06/23/13  2024 History  This chart was scribed for Jorge Munch, MD by Ardelia Mems, ED Scribe. This patient was seen in room MH07/MH07 and the patient's care was started at 8:38 PM.   First MD Initiated Contact with Patient 06/23/13 2036     Chief Complaint  Patient presents with  . Leg Pain    The history is provided by the patient. No language interpreter was used.   HPI Comments: Jorge Sherman is a 72 y.o. male who presents to the Emergency Department complaining of sudden onset, gradually worsening, constant, severe pain in his left lower leg onset this morning. He states that his pain is near his calf and it radiates to his left hip. He states that he has applied ice and taken 80 mg Oxycontin BID, as he is prescribed for chronic pain, without relief. He states that he also takes Neurontin and a muscle relaxer for chronic pain. He states that he has had leg pain for many years and irreversible nerve damage from "botched surgeries" in the past. However, he states that his leg pain in the past has been different. He expresses concern that he may have a blood clot in his leg. He reports increased numbness and tingling in his left foot distally. He states that he also has chronic back pain, 3 back surgeries and spinal stenosis in his lumbar region. He denies history of MI or stroke, but states that he has 2 stents. He reports dizziness today. He denies felling light-headed, SOB, abdominal pain or any other symptoms.   Past Medical History  Diagnosis Date  . Dizziness and giddiness   . Pain in joint, lower leg   . Unspecified essential hypertension   . Leukocytosis, unspecified   . Type II or unspecified type diabetes mellitus without mention of complication, not stated as uncontrolled   . Obstructive sleep apnea (adult) (pediatric)   . Depressive disorder, not elsewhere classified   . Essential and other specified forms of tremor   .  Unspecified cardiovascular disease   . Headache(784.0)   . Spinal stenosis, lumbar region, without neurogenic claudication   . Thoracic or lumbosacral neuritis or radiculitis, unspecified   . Chronic pain    Past Surgical History  Procedure Laterality Date  . Back surgey      times 3   Family History  Problem Relation Age of Onset  . Cancer Father   . Heart disease Father   . Stroke Mother   . Diabetes Mother    History  Substance Use Topics  . Smoking status: Never Smoker   . Smokeless tobacco: Never Used  . Alcohol Use: No    Review of Systems  Constitutional:       Per HPI, otherwise negative  HENT:       Per HPI, otherwise negative  Respiratory: Negative for shortness of breath.        Per HPI, otherwise negative  Cardiovascular:       Per HPI, otherwise negative  Gastrointestinal: Negative for vomiting and abdominal pain.  Endocrine:       Negative aside from HPI  Genitourinary:       Neg aside from HPI   Musculoskeletal: Positive for back pain.       Per HPI, otherwise negative Left leg pain.  Skin: Negative.   Neurological: Positive for numbness. Negative for syncope and light-headedness.  All other systems reviewed and are negative.  Allergies  Iohexol  Home Medications   Current Outpatient Rx  Name  Route  Sig  Dispense  Refill  . aspirin 325 MG tablet   Oral   Take 325 mg by mouth daily.         . celecoxib (CELEBREX) 200 MG capsule   Oral   Take 200 mg by mouth 2 (two) times daily.         . diazepam (VALIUM) 10 MG tablet   Oral   Take 10 mg by mouth 2 (two) times daily.         [name] DOCUSATE SODIUM PO   Oral   Take 1 capsule by mouth daily.         . DULoxetine (CYMBALTA) 30 MG capsule   Oral   Take 90 mg by mouth daily.         [name] ezetimibe (ZETIA) 10 MG tablet   Oral   Take 10 mg by mouth daily.         . furosemide (LASIX) 20 MG tablet   Oral   Take 20 mg by mouth daily.         [name] gabapentin (NEURONTIN) 800  MG tablet   Oral   Take 800 mg by mouth 4 (four) times daily.         [name] lidocaine (LIDODERM) 5 %   Transdermal   Place 3 patches onto the skin daily. Remove & Discard patch within 12 hours or as directed by MD         . lisinopril (PRINIVIL,ZESTRIL) 40 MG tablet   Oral   Take 40 mg by mouth daily.         [name] LORazepam (ATIVAN) 1 MG tablet   Oral   Take 2 mg by mouth at bedtime as needed for anxiety.         . metFORMIN (GLUCOPHAGE) 500 MG tablet   Oral   Take 500 mg by mouth daily with breakfast.         . methocarbamol (ROBAXIN) 500 MG tablet   Oral   Take 500 mg by mouth 3 (three) times daily.         . multivitamin (METANX) 3-35-2 MG TABS tablet   Oral   Take 1 tablet by mouth 2 (two) times daily.         [name] omeprazole-sodium bicarbonate (ZEGERID) 40-1100 MG per capsule   Oral   Take 1 capsule by mouth 2 (two) times daily.         [name] oxyCODONE (OXYCONTIN) 80 MG 12 hr tablet   Oral   Take 80 mg by mouth every 12 (twelve) hours.         . OXYCODONE HCL PO   Oral   Take 15 mg by mouth. Two tabs twice daily         . rosuvastatin (CRESTOR) 20 MG tablet   Oral   Take 20 mg by mouth daily.         [name] senna (SENOKOT) 8.6 MG TABS   Oral   Take 1 tablet by mouth 4 (four) times daily.         . tamsulosin (FLOMAX) 0.4 MG CAPS   Oral   Take 0.8 mg by mouth daily.         . traZODone (DESYREL) 50 MG tablet   Oral   Take 100 mg by mouth at bedtime.          There were no vitals  taken for this visit.  Physical Exam  Nursing note and vitals reviewed. Constitutional: He is oriented to person, place, and time. He appears well-developed. No distress.  HENT:  Head: Normocephalic and atraumatic.  Eyes: Conjunctivae and EOM are normal.  Cardiovascular: Normal rate and regular rhythm.   Pulmonary/Chest: Effort normal. No stridor. No respiratory distress.  Abdominal: He exhibits no distension.  Musculoskeletal: He exhibits no edema.  Pt's ROM  in his left is 180 to 90. He has good hip flexion and extension. His quadriceps and hamstrings are both intact. His pelvis is stable.  Neurological: He is alert and oriented to person, place, and time.  Skin: Skin is warm and dry.  Psychiatric: He has a normal mood and affect.    ED Course   Medications  HYDROmorphone (DILAUDID) injection 1 mg (not administered)   Procedures (including critical care time)  DIAGNOSTIC STUDIES: Oxygen Saturation is 99% on ra , nml by my interpretation.    COORDINATION OF CARE: 8:50 PM- Pt advised of plan for treatment and pt agrees.   Labs Reviewed  CBC WITH DIFFERENTIAL - Abnormal; Notable for the following:    WBC 11.5 (*)    RBC 3.96 (*)    Hemoglobin 11.3 (*)    HCT 33.5 (*)    Neutrophils Relative % 78 (*)    Neutro Abs 8.9 (*)    Lymphocytes Relative 11 (*)    Monocytes Absolute 1.1 (*)    All other components within normal limits  COMPREHENSIVE METABOLIC PANEL - Abnormal; Notable for the following:    Sodium 127 (*)    Chloride 86 (*)    Glucose, Bld 123 (*)    Total Bilirubin 0.2 (*)    GFR calc non Af Amer 87 (*)    All other components within normal limits  D-DIMER, QUANTITATIVE - Abnormal; Notable for the following:    D-Dimer, Quant 0.52 (*)    All other components within normal limits    No results found.  No diagnosis found.  MDM   I personally performed the services described in this documentation, which was scribed in my presence. The recorded information has been reviewed and is accurate.   Patient presents with acute left posterior knee pain.  On exam he is neurovascularly intact, however, with his description is a pain, is essential immobility, there is some concern for DVT.  D-dimer was positive, but given the absence of ultrasound tonight, the patient had empiric treatment with Lovenox, and will return tomorrow morning for Doppler ultrasound.  Jorge Munch, MD 06/24/13 [phone]

## 2013-06-23 NOTE — ED Notes (Signed)
EMS transport from home. Left leg pain since 0730 this morning, getting worse- pt has hx of chronic pain but this pain is new- denies recent travel

## 2013-06-24 ENCOUNTER — Ambulatory Visit (HOSPITAL_BASED_OUTPATIENT_CLINIC_OR_DEPARTMENT_OTHER)
Admission: RE | Admit: 2013-06-24 | Discharge: 2013-06-24 | Disposition: A | Discharge status: Home / Self Care | Payer: Medicare Other | Source: Ambulatory Visit | Attending: Emergency Medicine | Admitting: Emergency Medicine

## 2013-06-24 ENCOUNTER — Ambulatory Visit (HOSPITAL_BASED_OUTPATIENT_CLINIC_OR_DEPARTMENT_OTHER): Admit: 2013-06-24 | Payer: Medicare Other

## 2013-06-24 DIAGNOSIS — M7989 Other specified soft tissue disorders: Secondary | ICD-10-CM | POA: Diagnosis not present

## 2013-06-24 DIAGNOSIS — M79609 Pain in unspecified limb: Secondary | ICD-10-CM | POA: Insufficient documentation

## 2013-06-24 DIAGNOSIS — R52 Pain, unspecified: Secondary | ICD-10-CM

## 2013-06-24 DIAGNOSIS — R937 Abnormal findings on diagnostic imaging of other parts of musculoskeletal system: Secondary | ICD-10-CM | POA: Insufficient documentation

## 2013-07-04 DIAGNOSIS — E119 Type 2 diabetes mellitus without complications: Secondary | ICD-10-CM | POA: Diagnosis not present

## 2013-07-04 DIAGNOSIS — E782 Mixed hyperlipidemia: Secondary | ICD-10-CM | POA: Diagnosis not present

## 2013-07-04 DIAGNOSIS — I2510 Atherosclerotic heart disease of native coronary artery without angina pectoris: Secondary | ICD-10-CM | POA: Diagnosis not present

## 2013-07-04 DIAGNOSIS — Z125 Encounter for screening for malignant neoplasm of prostate: Secondary | ICD-10-CM | POA: Diagnosis not present

## 2013-07-04 DIAGNOSIS — I10 Essential (primary) hypertension: Secondary | ICD-10-CM | POA: Diagnosis not present

## 2013-07-30 DIAGNOSIS — L538 Other specified erythematous conditions: Secondary | ICD-10-CM | POA: Diagnosis not present

## 2013-07-30 DIAGNOSIS — Z85828 Personal history of other malignant neoplasm of skin: Secondary | ICD-10-CM | POA: Diagnosis not present

## 2013-08-15 DIAGNOSIS — I10 Essential (primary) hypertension: Secondary | ICD-10-CM | POA: Diagnosis not present

## 2013-08-15 DIAGNOSIS — I2510 Atherosclerotic heart disease of native coronary artery without angina pectoris: Secondary | ICD-10-CM | POA: Diagnosis not present

## 2013-08-15 DIAGNOSIS — E119 Type 2 diabetes mellitus without complications: Secondary | ICD-10-CM | POA: Diagnosis not present

## 2013-08-15 DIAGNOSIS — R300 Dysuria: Secondary | ICD-10-CM | POA: Diagnosis not present

## 2013-08-20 DIAGNOSIS — M2550 Pain in unspecified joint: Secondary | ICD-10-CM | POA: Diagnosis not present

## 2013-08-20 DIAGNOSIS — M159 Polyosteoarthritis, unspecified: Secondary | ICD-10-CM | POA: Diagnosis not present

## 2013-08-20 DIAGNOSIS — M79609 Pain in unspecified limb: Secondary | ICD-10-CM | POA: Diagnosis not present

## 2013-08-20 DIAGNOSIS — M67919 Unspecified disorder of synovium and tendon, unspecified shoulder: Secondary | ICD-10-CM | POA: Diagnosis not present

## 2013-08-20 DIAGNOSIS — M25569 Pain in unspecified knee: Secondary | ICD-10-CM | POA: Diagnosis not present

## 2013-08-20 DIAGNOSIS — M76899 Other specified enthesopathies of unspecified lower limb, excluding foot: Secondary | ICD-10-CM | POA: Diagnosis not present

## 2013-09-04 DIAGNOSIS — R300 Dysuria: Secondary | ICD-10-CM | POA: Diagnosis not present

## 2013-09-04 DIAGNOSIS — M67919 Unspecified disorder of synovium and tendon, unspecified shoulder: Secondary | ICD-10-CM | POA: Diagnosis not present

## 2013-09-04 DIAGNOSIS — M76899 Other specified enthesopathies of unspecified lower limb, excluding foot: Secondary | ICD-10-CM | POA: Diagnosis not present

## 2013-09-04 DIAGNOSIS — M159 Polyosteoarthritis, unspecified: Secondary | ICD-10-CM | POA: Diagnosis not present

## 2013-09-04 DIAGNOSIS — M2550 Pain in unspecified joint: Secondary | ICD-10-CM | POA: Diagnosis not present

## 2013-09-25 DIAGNOSIS — M159 Polyosteoarthritis, unspecified: Secondary | ICD-10-CM | POA: Diagnosis not present

## 2013-09-25 DIAGNOSIS — E119 Type 2 diabetes mellitus without complications: Secondary | ICD-10-CM | POA: Diagnosis not present

## 2013-09-25 DIAGNOSIS — I2510 Atherosclerotic heart disease of native coronary artery without angina pectoris: Secondary | ICD-10-CM | POA: Diagnosis not present

## 2013-09-25 DIAGNOSIS — M545 Low back pain: Secondary | ICD-10-CM | POA: Diagnosis not present

## 2013-09-25 DIAGNOSIS — Z23 Encounter for immunization: Secondary | ICD-10-CM | POA: Diagnosis not present

## 2013-10-21 ENCOUNTER — Telehealth: Payer: Self-pay | Admitting: Neurology

## 2013-10-21 NOTE — Telephone Encounter (Signed)
I attempted to call both of the numbers listed for this patient. One was not a working number. The other did not accept voice messages.

## 2013-10-23 DIAGNOSIS — M159 Polyosteoarthritis, unspecified: Secondary | ICD-10-CM | POA: Diagnosis not present

## 2013-10-23 DIAGNOSIS — M2550 Pain in unspecified joint: Secondary | ICD-10-CM | POA: Diagnosis not present

## 2013-10-23 DIAGNOSIS — M67919 Unspecified disorder of synovium and tendon, unspecified shoulder: Secondary | ICD-10-CM | POA: Diagnosis not present

## 2013-10-23 DIAGNOSIS — M76899 Other specified enthesopathies of unspecified lower limb, excluding foot: Secondary | ICD-10-CM | POA: Diagnosis not present

## 2013-10-23 DIAGNOSIS — M25569 Pain in unspecified knee: Secondary | ICD-10-CM | POA: Diagnosis not present

## 2013-10-29 ENCOUNTER — Encounter: Payer: Self-pay | Admitting: Interventional Cardiology

## 2013-10-29 DIAGNOSIS — E119 Type 2 diabetes mellitus without complications: Secondary | ICD-10-CM | POA: Insufficient documentation

## 2013-10-29 DIAGNOSIS — F329 Major depressive disorder, single episode, unspecified: Secondary | ICD-10-CM | POA: Insufficient documentation

## 2013-10-29 DIAGNOSIS — G894 Chronic pain syndrome: Secondary | ICD-10-CM | POA: Insufficient documentation

## 2013-10-29 DIAGNOSIS — G4733 Obstructive sleep apnea (adult) (pediatric): Secondary | ICD-10-CM | POA: Insufficient documentation

## 2013-10-29 DIAGNOSIS — G252 Other specified forms of tremor: Secondary | ICD-10-CM | POA: Insufficient documentation

## 2013-10-29 DIAGNOSIS — G250 Essential tremor: Secondary | ICD-10-CM | POA: Insufficient documentation

## 2013-10-29 DIAGNOSIS — M48061 Spinal stenosis, lumbar region without neurogenic claudication: Secondary | ICD-10-CM | POA: Insufficient documentation

## 2013-10-29 DIAGNOSIS — R519 Headache, unspecified: Secondary | ICD-10-CM | POA: Insufficient documentation

## 2013-10-29 DIAGNOSIS — R42 Dizziness and giddiness: Secondary | ICD-10-CM | POA: Insufficient documentation

## 2013-10-29 DIAGNOSIS — M25569 Pain in unspecified knee: Secondary | ICD-10-CM | POA: Insufficient documentation

## 2013-10-29 DIAGNOSIS — IMO0002 Reserved for concepts with insufficient information to code with codable children: Secondary | ICD-10-CM | POA: Insufficient documentation

## 2013-10-29 DIAGNOSIS — I10 Essential (primary) hypertension: Secondary | ICD-10-CM | POA: Insufficient documentation

## 2013-10-29 DIAGNOSIS — Z794 Long term (current) use of insulin: Secondary | ICD-10-CM | POA: Insufficient documentation

## 2013-10-29 DIAGNOSIS — R51 Headache: Secondary | ICD-10-CM | POA: Insufficient documentation

## 2013-10-29 DIAGNOSIS — D72829 Elevated white blood cell count, unspecified: Secondary | ICD-10-CM | POA: Insufficient documentation

## 2013-10-29 DIAGNOSIS — I2510 Atherosclerotic heart disease of native coronary artery without angina pectoris: Secondary | ICD-10-CM | POA: Insufficient documentation

## 2013-10-29 DIAGNOSIS — F32A Depression, unspecified: Secondary | ICD-10-CM | POA: Insufficient documentation

## 2013-10-29 DIAGNOSIS — G8929 Other chronic pain: Secondary | ICD-10-CM | POA: Insufficient documentation

## 2013-10-30 ENCOUNTER — Encounter: Payer: Self-pay | Admitting: Interventional Cardiology

## 2013-10-30 ENCOUNTER — Ambulatory Visit (INDEPENDENT_AMBULATORY_CARE_PROVIDER_SITE_OTHER): Payer: Medicare Other | Admitting: Interventional Cardiology

## 2013-10-30 VITALS — BP 110/70 | HR 83 | Ht 71.0 in | Wt 242.0 lb

## 2013-10-30 DIAGNOSIS — I10 Essential (primary) hypertension: Secondary | ICD-10-CM | POA: Diagnosis not present

## 2013-10-30 DIAGNOSIS — N529 Male erectile dysfunction, unspecified: Secondary | ICD-10-CM | POA: Diagnosis not present

## 2013-10-30 DIAGNOSIS — I2510 Atherosclerotic heart disease of native coronary artery without angina pectoris: Secondary | ICD-10-CM | POA: Diagnosis not present

## 2013-10-30 MED ORDER — SILDENAFIL CITRATE 50 MG PO TABS: 50.0000 mg | ORAL_TABLET | Freq: Every day | ORAL | Status: DC | PRN

## 2013-10-30 NOTE — Patient Instructions (Signed)
You have been given an Rx for Viagra. Do not use with nitrate  Medication with in 24 hours  Your physician recommends that you continue on your current medications as directed. Please refer to the Current Medication list given to you today.  Your physician wants you to follow-up in: 1 year You will receive a reminder letter in the mail two months in advance. If you don't receive a letter, please call our office to schedule the follow-up appointment.

## 2013-10-30 NOTE — Progress Notes (Signed)
Patient ID: Jorge Sherman, male   DOB: Apr 10, 1941, 72 y.o.   MRN: [id number] Past Medical History  Hypertension   Peripheral neuropathy   Spinal stenosis   Osteoarthrosis   Coronary artery disease with BMS in the LAD and right coronary, July 2009   IBS   Esophageal reflux   Hypercholesterolemia   Depression   Anxiety   Anemia   type II diabetes      [address] [place], [place]  [address] Phone: [phone] Fax:  [phone]  Date:  10/30/2013   ID:  Jorge Sherman, DOB 1941/05/25, MRN [id number]  PCP:  Georgianne Fick, MD   ASSESSMENT:  1. Coronary artery disease, stable 2. Erectile dysfunction 3. Hypertension, good control 4. Hyperlipidemia  PLAN:  1. Viagra 50 mg to be used as instructed. Cautions with nitroglycerin given. 2. Clinical followup in one year  3. An appropriate behavior around our staff nurses. Should this continue he may need to be discharged from the practice. Will monitor to   SUBJECTIVE: Jorge Sherman is a 72 y.o. male who has chronic low back problems. He got into a long time at about divorce from his wife. He is told the story before. He has no cardiac complaints. He denies chest pain and nitroglycerin use. He complains of erectile dysfunction and requests therapy. He spoke about meeting a new male that he has interested in.   Wt Readings from Last 3 Encounters:  10/30/13 242 lb (109.77 kg)     Past Medical History  Diagnosis Date  . Dizziness and giddiness   . Pain in joint, lower leg   . Unspecified essential hypertension   . Leukocytosis, unspecified   . Type II or unspecified type diabetes mellitus without mention of complication, not stated as uncontrolled   . Obstructive sleep apnea (adult) (pediatric)   . Depressive disorder, not elsewhere classified   . Essential and other specified forms of tremor   . Unspecified cardiovascular disease   . Headache(784.0)   . Spinal stenosis, lumbar region,  without neurogenic claudication   . Thoracic or lumbosacral neuritis or radiculitis, unspecified   . Chronic pain     Current Outpatient Prescriptions  Medication Sig Dispense Refill  . aspirin 325 MG tablet Take 325 mg by mouth daily.      . celecoxib (CELEBREX) 200 MG capsule Take 200 mg by mouth 2 (two) times daily.      . diazepam (VALIUM) 10 MG tablet Take 10 mg by mouth 2 (two) times daily.      [name] DOCUSATE SODIUM PO Take 1 capsule by mouth daily.      . DULoxetine (CYMBALTA) 30 MG capsule Take 90 mg by mouth daily.      [name] ezetimibe (ZETIA) 10 MG tablet Take 10 mg by mouth daily.      . furosemide (LASIX) 20 MG tablet Take 20 mg by mouth daily.      [name] gabapentin (NEURONTIN) 800 MG tablet Take 800 mg by mouth 4 (four) times daily.      [name] lidocaine (LIDODERM) 5 % Place 3 patches onto the skin daily. Remove & Discard patch within 12 hours or as directed by MD      . lisinopril (PRINIVIL,ZESTRIL) 40 MG tablet Take 40 mg by mouth daily.      [name] LORazepam (ATIVAN) 1 MG tablet Take 2 mg by mouth at bedtime as needed for anxiety.      . multivitamin (METANX) 3-35-2 MG  TABS tablet Take 1 tablet by mouth 2 (two) times daily.      [name] omeprazole-sodium bicarbonate (ZEGERID) 40-1100 MG per capsule Take 1 capsule by mouth daily.       [name] oxyCODONE (OXYCONTIN) 80 MG 12 hr tablet Take 80 mg by mouth every 12 (twelve) hours.      . OXYCODONE HCL PO Take 15 mg by mouth. Two tabs twice daily      . rosuvastatin (CRESTOR) 20 MG tablet Take 20 mg by mouth daily.      [name] senna (SENOKOT) 8.6 MG TABS Take 1 tablet by mouth 4 (four) times daily.      . traZODone (DESYREL) 50 MG tablet Take 100 mg by mouth at bedtime.      . metFORMIN (GLUCOPHAGE) 500 MG tablet Take 500 mg by mouth daily with breakfast.      . methocarbamol (ROBAXIN) 500 MG tablet Take 500 mg by mouth 3 (three) times daily.      . tamsulosin (FLOMAX) 0.4 MG CAPS Take 0.8 mg by mouth daily.       No current facility-administered medications for  this visit.    Allergies:    Allergies  Allergen Reactions  . Iohexol      Desc: A FEW HIVES POST CT, IODINE     Social History:  The patient  reports that he has never smoked. He has never used smokeless tobacco. He reports that he does not drink alcohol or use illicit drugs.   ROS:  Please see the history of present illness.   No cardiac complaints.   All other systems reviewed and negative.   OBJECTIVE: VS:  BP 110/70  Pulse 83  Ht 5\' 11"  (1.803 m)  Wt 242 lb (109.77 kg)  BMI 33.77 kg/m2 Well nourished, well developed, in no acute distress, obese HEENT: normal Neck: JVD flat. Carotid bruit absent  Cardiac:  normal S1, S2; RRR; no murmur Lungs:  clear to auscultation bilaterally, no wheezing, rhonchi or rales Abd: soft, nontender, no hepatomegaly Ext: Edema absent. Pulses 2+ Skin: warm and dry Neuro:  CNs 2-12 intact, no focal abnormalities noted  EKG:  Normal sinus rhythm, left axis deviation. No acute changes.       Signed, Darci Needle III, MD 10/30/2013 4:38 PM

## 2013-12-03 ENCOUNTER — Emergency Department (HOSPITAL_COMMUNITY)
Admission: EM | Admit: 2013-12-03 | Discharge: 2013-12-04 | Disposition: A | Discharge status: Home / Self Care | Payer: Medicare Other | Attending: Emergency Medicine | Admitting: Emergency Medicine

## 2013-12-03 ENCOUNTER — Encounter (HOSPITAL_COMMUNITY): Payer: Self-pay | Admitting: Emergency Medicine

## 2013-12-03 ENCOUNTER — Emergency Department (HOSPITAL_COMMUNITY): Payer: Medicare Other

## 2013-12-03 DIAGNOSIS — IMO0002 Reserved for concepts with insufficient information to code with codable children: Secondary | ICD-10-CM | POA: Insufficient documentation

## 2013-12-03 DIAGNOSIS — R52 Pain, unspecified: Secondary | ICD-10-CM | POA: Diagnosis not present

## 2013-12-03 DIAGNOSIS — Z9889 Other specified postprocedural states: Secondary | ICD-10-CM | POA: Diagnosis not present

## 2013-12-03 DIAGNOSIS — M545 Low back pain, unspecified: Secondary | ICD-10-CM | POA: Diagnosis not present

## 2013-12-03 DIAGNOSIS — M67919 Unspecified disorder of synovium and tendon, unspecified shoulder: Secondary | ICD-10-CM | POA: Diagnosis not present

## 2013-12-03 DIAGNOSIS — Y9389 Activity, other specified: Secondary | ICD-10-CM | POA: Insufficient documentation

## 2013-12-03 DIAGNOSIS — G8929 Other chronic pain: Secondary | ICD-10-CM | POA: Diagnosis not present

## 2013-12-03 DIAGNOSIS — W07XXXA Fall from chair, initial encounter: Secondary | ICD-10-CM | POA: Insufficient documentation

## 2013-12-03 DIAGNOSIS — M2550 Pain in unspecified joint: Secondary | ICD-10-CM | POA: Diagnosis not present

## 2013-12-03 DIAGNOSIS — M159 Polyosteoarthritis, unspecified: Secondary | ICD-10-CM | POA: Diagnosis not present

## 2013-12-03 DIAGNOSIS — W19XXXA Unspecified fall, initial encounter: Secondary | ICD-10-CM

## 2013-12-03 DIAGNOSIS — Y92009 Unspecified place in unspecified non-institutional (private) residence as the place of occurrence of the external cause: Secondary | ICD-10-CM | POA: Insufficient documentation

## 2013-12-03 DIAGNOSIS — M76899 Other specified enthesopathies of unspecified lower limb, excluding foot: Secondary | ICD-10-CM | POA: Diagnosis not present

## 2013-12-03 MED ORDER — HYDROMORPHONE HCL PF 1 MG/ML IJ SOLN
1.0000 mg | Freq: Once | INTRAMUSCULAR | Status: AC
  Administered 2013-12-04: 1 mg via INTRAVENOUS
  Filled 2013-12-03: qty 1

## 2013-12-03 MED ORDER — ONDANSETRON HCL 4 MG/2ML IJ SOLN
4.0000 mg | Freq: Once | INTRAMUSCULAR | Status: AC
  Administered 2013-12-04: 4 mg via INTRAVENOUS
  Filled 2013-12-03: qty 2

## 2013-12-03 NOTE — ED Provider Notes (Signed)
CSN: [id number]     Arrival date & time 12/03/13  2233 History   First MD Initiated Contact with Patient 12/03/13 2320     Chief Complaint  Patient presents with  . Fall   (Consider location/radiation/quality/duration/timing/severity/associated sxs/prior Treatment) HPI Hx per patient - At home tonight, changing a light bulb on the ceiling, standing on a step stool. He fell backwards landing on his back.  He denies striking his head or hurting his neck.  Pain is sharp and severe, located lumbar region and right of lumbar region.  PT is mostly concerned about the hardware in his back, has a "back stimulator" for chronic pain, 3 prior back surgeries. He was able to ambulate after falling. No LE weakness. He also sustained abrasion to R elbow - he declines any sig pain in his elbow and declines an xray. He is agreeable to imaging of his lumbar spine.    No incontinence. No new numbness.    History reviewed. No pertinent past medical history. Past Surgical History  Procedure Laterality Date  . Back surgery     History reviewed. No pertinent family history. History  Substance Use Topics  . Smoking status: Unknown If Ever Smoked  . Smokeless tobacco: Not on file  . Alcohol Use: No    Review of Systems  Constitutional: Negative for fever and chills.  Respiratory: Negative for shortness of breath.   Cardiovascular: Negative for chest pain.  Gastrointestinal: Negative for vomiting and abdominal pain.  Genitourinary: Negative for flank pain.  Musculoskeletal: Positive for back pain. Negative for neck pain.  Skin: Negative for rash.  Neurological: Negative for headaches.  All other systems reviewed and are negative.    Allergies  Iodine  Home Medications   Current Outpatient Rx  Name  Route  Sig  Dispense  Refill  . OxyCODONE (OXYCONTIN) 80 mg T12A 12 hr tablet   Oral   Take 80 mg by mouth every 12 (twelve) hours.         [name] oxyCODONE (ROXICODONE) 15 MG immediate release  tablet   Oral   Take 15 mg by mouth every 6 (six) hours as needed for pain.          BP 121/67  Pulse 96  Temp(Src) 99 F (37.2 C) (Oral)  Resp 20  SpO2 96% Physical Exam  Constitutional: He is oriented to person, place, and time. He appears well-developed and well-nourished.  HENT:  Head: Normocephalic and atraumatic.  Eyes: EOM are normal. Pupils are equal, round, and reactive to light.  Neck: Neck supple.  No C spine tenderness or deformity  Cardiovascular: Normal rate, regular rhythm and intact distal pulses.   Pulmonary/Chest: Effort normal and breath sounds normal. No respiratory distress. He exhibits no tenderness.  Abdominal: Soft. Bowel sounds are normal.  Musculoskeletal: Normal range of motion. He exhibits no edema.  Tenderness over lumbar spine and right paralumbar spine. No LE deficits with sensorium to light touch equal/ intact, equal patellar DTRs, dorsi/ plantar flexion equal/ intact  Neurological: He is alert and oriented to person, place, and time. No cranial nerve deficit.  Skin: Skin is warm and dry.    ED Course  Procedures (including critical care time) Labs Review Labs Reviewed - No data to display Imaging Review Dg Lumbar Spine Complete  12/04/2013   CLINICAL DATA:  Fall, history of multiple lumbar surgeries.  EXAM: LUMBAR SPINE - COMPLETE 4+ VIEW  COMPARISON:  None.  FINDINGS: Lumbar vertebral bodies appear intact with maintenance of  lumbar lordosis. Bilateral L4 pedicle screws and sacral screws with perching rod. Grade 1 L4-5 retrolisthesis. Severe L2-3 degenerative disc disease. Mild L4-5 degenerative disc disease. Mild ventral spurring. No destructive bony lesions. Epidural stimulator in place, entering the epidural canal via interlaminar approach at T12-L1.  No destructive bony lesions. Sacroiliac joints are symmetric. Mild vascular calcifications. No definite pars interarticularis defect though partially obscured due to the epidural stimulator in the  left back.  IMPRESSION: No acute lumbar spine fracture.  Grade 1 L4-5 retrolisthesis, status post L4 through S1 posterior instrumentation.   Electronically Signed   By: Elon Alas   On: 12/04/2013 00:32   IV fentanyl, Zofran  1:02 AM on recheck is feeling much better has been able to get up and go to the bathroom. X-rays reviewed with patient as above. He follows up with Dr. Joya Salm neurosurgery. He will continue OxyContin at home as prescribed and is requesting prescription for oxycodone. No neuro deficits or indication for advanced imaging at this time. Patient is comfortable to plan discharge home   MDM  Diagnosis: Back pain, fall, history of chronic back pain  Evaluated with imaging reviewed as above, hardware place and no acute fractures identified Pain control IV narcotics Vital signs nurse's notes reviewed and considered   Teressa Lower, MD 12/04/13 0104

## 2013-12-03 NOTE — ED Notes (Signed)
Patient arrives via EMS after falling off a step stool while changing a light bulb--No LOC Patient with "back stimulator" in place due to hx of chronic back pain/surgery and hx of rods in lumbar--can't have MRI Patient fell on left side and believes that "the hardware is busted." Patient with c/o right side pain--no pain on palpation and is able to stand and pivot without difficulty Patient given IM 100 mcg Fentanyl by EMS en route to ED

## 2013-12-03 NOTE — ED Notes (Signed)
Bed: BE67 Expected date:  Expected time:  Means of arrival:  Comments: EMS 54M, fall from stool c/o back pain

## 2013-12-03 NOTE — ED Notes (Signed)
MD at bedside. 

## 2013-12-04 ENCOUNTER — Encounter: Payer: Self-pay | Admitting: Interventional Cardiology

## 2013-12-04 MED ORDER — HYDROMORPHONE HCL PF 1 MG/ML IJ SOLN
1.0000 mg | Freq: Once | INTRAMUSCULAR | Status: AC
  Administered 2013-12-04: 1 mg via INTRAVENOUS
  Filled 2013-12-04: qty 1

## 2013-12-04 MED ORDER — OXYCODONE-ACETAMINOPHEN 5-325 MG PO TABS: 1.0000 | ORAL_TABLET | Freq: Four times a day (QID) | ORAL | Status: DC | PRN

## 2013-12-04 NOTE — ED Notes (Signed)
Ambulated pt in room to hallway. Pt denies any complaints of dizziness or lightheadedness.

## 2013-12-04 NOTE — Discharge Instructions (Signed)
Back Pain, Adult Low back pain is very common. About 1 in 5 people have back pain.The cause of low back pain is rarely dangerous. The pain often gets better over time.About half of people with a sudden onset of back pain feel better in just 2 weeks. About 8 in 10 people feel better by 6 weeks.  CAUSES Some common causes of back pain include:  Strain of the muscles or ligaments supporting the spine.  Wear and tear (degeneration) of the spinal discs.  Arthritis.  Direct injury to the back. DIAGNOSIS Most of the time, the direct cause of low back pain is not known.However, back pain can be treated effectively even when the exact cause of the pain is unknown.Answering your caregiver's questions about your overall health and symptoms is one of the most accurate ways to make sure the cause of your pain is not dangerous. If your caregiver needs more information, he or she may order lab work or imaging tests (X-rays or MRIs).However, even if imaging tests show changes in your back, this usually does not require surgery. HOME CARE INSTRUCTIONS For many people, back pain returns.Since low back pain is rarely dangerous, it is often a condition that people can learn to manageon their own.   Remain active. It is stressful on the back to sit or stand in one place. Do not sit, drive, or stand in one place for more than 30 minutes at a time. Take short walks on level surfaces as soon as pain allows.Try to increase the length of time you walk each day.  Do not stay in bed.Resting more than 1 or 2 days can delay your recovery.  Do not avoid exercise or work.Your body is made to move.It is not dangerous to be active, even though your back may hurt.Your back will likely heal faster if you return to being active before your pain is gone.  Pay attention to your body when you bend and lift. Many people have less discomfortwhen lifting if they bend their knees, keep the load close to their bodies,and  avoid twisting. Often, the most comfortable positions are those that put less stress on your recovering back.  Find a comfortable position to sleep. Use a firm mattress and lie on your side with your knees slightly bent. If you lie on your back, put a pillow under your knees.  Only take over-the-counter or prescription medicines as directed by your caregiver. Over-the-counter medicines to reduce pain and inflammation are often the most helpful.Your caregiver may prescribe muscle relaxant drugs.These medicines help dull your pain so you can more quickly return to your normal activities and healthy exercise.  Put ice on the injured area.  Put ice in a plastic bag.  Place a towel between your skin and the bag.  Leave the ice on for 15-20 minutes, 03-04 times a day for the first 2 to 3 days. After that, ice and heat may be alternated to reduce pain and spasms.  Ask your caregiver about trying back exercises and gentle massage. This may be of some benefit.  Avoid feeling anxious or stressed.Stress increases muscle tension and can worsen back pain.It is important to recognize when you are anxious or stressed and learn ways to manage it.Exercise is a great option. SEEK MEDICAL CARE IF:  You have pain that is not relieved with rest or medicine.  You have pain that does not improve in 1 week.  You have new symptoms.  You are generally not feeling well. SEEK   IMMEDIATE MEDICAL CARE IF:   You have pain that radiates from your back into your legs.  You develop new bowel or bladder control problems.  You have unusual weakness or numbness in your arms or legs.  You develop nausea or vomiting.  You develop abdominal pain.  You feel faint. Document Released: 11/07/2005 Document Revised: 05/08/2012 Document Reviewed: 03/28/2011 [employer] Patient Information 2014 [employer]  

## 2013-12-05 DIAGNOSIS — I2510 Atherosclerotic heart disease of native coronary artery without angina pectoris: Secondary | ICD-10-CM | POA: Diagnosis not present

## 2013-12-05 DIAGNOSIS — M545 Low back pain, unspecified: Secondary | ICD-10-CM | POA: Diagnosis not present

## 2013-12-05 DIAGNOSIS — I10 Essential (primary) hypertension: Secondary | ICD-10-CM | POA: Diagnosis not present

## 2013-12-05 DIAGNOSIS — M25559 Pain in unspecified hip: Secondary | ICD-10-CM | POA: Diagnosis not present

## 2013-12-05 DIAGNOSIS — S79919A Unspecified injury of unspecified hip, initial encounter: Secondary | ICD-10-CM | POA: Diagnosis not present

## 2013-12-06 DIAGNOSIS — I2510 Atherosclerotic heart disease of native coronary artery without angina pectoris: Secondary | ICD-10-CM | POA: Diagnosis not present

## 2013-12-06 DIAGNOSIS — E119 Type 2 diabetes mellitus without complications: Secondary | ICD-10-CM | POA: Diagnosis not present

## 2013-12-06 DIAGNOSIS — I10 Essential (primary) hypertension: Secondary | ICD-10-CM | POA: Diagnosis not present

## 2013-12-06 DIAGNOSIS — E782 Mixed hyperlipidemia: Secondary | ICD-10-CM | POA: Diagnosis not present

## 2013-12-13 ENCOUNTER — Telehealth: Payer: Self-pay | Admitting: Neurology

## 2013-12-13 NOTE — Telephone Encounter (Signed)
PT NEEDS APPT--WAS SCHEDULED WITH DR. Janann Colonel LAST JULY BUT PT CANCELLED BECAUSE DR. Janann Colonel WOULD NOT WRITE RX UNTIL HE WAS SEEN-DR. LOVE PT.

## 2013-12-16 ENCOUNTER — Other Ambulatory Visit: Payer: Self-pay | Admitting: Neurology

## 2013-12-16 DIAGNOSIS — M545 Low back pain, unspecified: Secondary | ICD-10-CM

## 2013-12-16 NOTE — Telephone Encounter (Signed)
Informed patient and sched upcoming appt./confirmed

## 2013-12-16 NOTE — Telephone Encounter (Signed)
Spoke with patient and he only wants to sched an  Appt. If physician is willing to continue to prescribe pain meds (oxycodone, oxycontin, neurontin) for his back,formerly Dr Jorge Sherman patient for 14 years.  Unable to have it done thru PCP([facility]),cannot have any more surgeries because of age and was told that this is the only to treat pain

## 2013-12-16 NOTE — Telephone Encounter (Signed)
That's fine. I will replace the referral for pain clinic.

## 2013-12-16 NOTE — Telephone Encounter (Addendum)
Patient said that he is willing to go to the pain management clinic, did not know anything about Dr Tressia Danas referral, wants it renewed ([facility]) and would still like to schedule f/u appt for his neurology care with Dr Janann Colonel if he would continue to see him

## 2013-12-16 NOTE — Telephone Encounter (Signed)
Per a review of the prior phone notes, Dr Erling Cruz referred him to the pain clinic for further treatment of his chronic pain. They will be the ones to manage his pain medications. I will not be prescribing his oxycodone or oxycontin. Thanks.

## 2014-01-10 ENCOUNTER — Ambulatory Visit: Payer: Self-pay | Admitting: Neurology

## 2014-01-15 DIAGNOSIS — E119 Type 2 diabetes mellitus without complications: Secondary | ICD-10-CM | POA: Diagnosis not present

## 2014-01-15 DIAGNOSIS — I2510 Atherosclerotic heart disease of native coronary artery without angina pectoris: Secondary | ICD-10-CM | POA: Diagnosis not present

## 2014-01-22 DIAGNOSIS — M159 Polyosteoarthritis, unspecified: Secondary | ICD-10-CM | POA: Diagnosis not present

## 2014-01-22 DIAGNOSIS — R32 Unspecified urinary incontinence: Secondary | ICD-10-CM | POA: Diagnosis not present

## 2014-01-22 DIAGNOSIS — E119 Type 2 diabetes mellitus without complications: Secondary | ICD-10-CM | POA: Diagnosis not present

## 2014-01-22 DIAGNOSIS — I2510 Atherosclerotic heart disease of native coronary artery without angina pectoris: Secondary | ICD-10-CM | POA: Diagnosis not present

## 2014-01-28 ENCOUNTER — Other Ambulatory Visit: Payer: Self-pay | Admitting: Dermatology

## 2014-01-28 DIAGNOSIS — L565 Disseminated superficial actinic porokeratosis (DSAP): Secondary | ICD-10-CM | POA: Diagnosis not present

## 2014-01-28 DIAGNOSIS — Q828 Other specified congenital malformations of skin: Secondary | ICD-10-CM | POA: Diagnosis not present

## 2014-01-28 DIAGNOSIS — D692 Other nonthrombocytopenic purpura: Secondary | ICD-10-CM | POA: Diagnosis not present

## 2014-01-28 DIAGNOSIS — Z85828 Personal history of other malignant neoplasm of skin: Secondary | ICD-10-CM | POA: Diagnosis not present

## 2014-01-29 ENCOUNTER — Encounter (HOSPITAL_COMMUNITY): Payer: Self-pay | Admitting: Emergency Medicine

## 2014-01-29 ENCOUNTER — Emergency Department (HOSPITAL_COMMUNITY)
Admission: EM | Admit: 2014-01-29 | Discharge: 2014-01-29 | Discharge status: Against Medical Advice | Payer: Medicare Other | Attending: Emergency Medicine | Admitting: Emergency Medicine

## 2014-01-29 DIAGNOSIS — R079 Chest pain, unspecified: Secondary | ICD-10-CM | POA: Diagnosis not present

## 2014-01-29 DIAGNOSIS — E119 Type 2 diabetes mellitus without complications: Secondary | ICD-10-CM | POA: Insufficient documentation

## 2014-01-29 DIAGNOSIS — R0602 Shortness of breath: Secondary | ICD-10-CM | POA: Diagnosis not present

## 2014-01-29 DIAGNOSIS — R109 Unspecified abdominal pain: Secondary | ICD-10-CM | POA: Diagnosis not present

## 2014-01-29 LAB — CBC
HCT: 34.3 % — ABNORMAL LOW (ref 39.0–52.0)
Hemoglobin: 11.5 g/dL — ABNORMAL LOW (ref 13.0–17.0)
MCH: 27.4 pg (ref 26.0–34.0)
MCHC: 33.5 g/dL (ref 30.0–36.0)
MCV: 81.9 fL (ref 78.0–100.0)
Platelets: 254 10*3/uL (ref 150–400)
RBC: 4.19 MIL/uL — ABNORMAL LOW (ref 4.22–5.81)
RDW: 14.9 % (ref 11.5–15.5)
WBC: 12.6 10*3/uL — ABNORMAL HIGH (ref 4.0–10.5)

## 2014-01-29 LAB — BASIC METABOLIC PANEL
BUN: 13 mg/dL (ref 6–23)
CO2: 29 mEq/L (ref 19–32)
Calcium: 8.8 mg/dL (ref 8.4–10.5)
Chloride: 90 mEq/L — ABNORMAL LOW (ref 96–112)
Creatinine, Ser: 0.87 mg/dL (ref 0.50–1.35)
GFR calc Af Amer: >90 mL/min (ref >90)
GFR, EST NON AFRICAN AMERICAN: 84 mL/min — AB (ref >90)
Glucose, Bld: 125 mg/dL — ABNORMAL HIGH (ref 70–99)
Potassium: 4.7 mEq/L (ref 3.7–5.3)
Sodium: 130 mEq/L — ABNORMAL LOW (ref 137–147)

## 2014-01-29 LAB — TROPONIN I

## 2014-01-29 LAB — I-STAT TROPONIN, ED: Troponin i, poc: 0 ng/mL (ref 0.00–0.08)

## 2014-01-29 LAB — PRO B NATRIURETIC PEPTIDE: Pro B Natriuretic peptide (BNP): 35.6 pg/mL (ref 0–125)

## 2014-01-29 NOTE — ED Notes (Signed)
Patient called ems to his home x 2 today.  Patient with exertional chest pain and sob.  Patient has multiple meds for anxiety.  Patient is alert and oriented.  Skin warm and dry.  No chest pain upon arrival to Ed.  Patient ekg was normal per ems report.  Patient reports he will not wait for a long time.  He will call his daughter for transport home if needed

## 2014-02-06 DIAGNOSIS — M79609 Pain in unspecified limb: Secondary | ICD-10-CM | POA: Diagnosis not present

## 2014-02-06 DIAGNOSIS — E782 Mixed hyperlipidemia: Secondary | ICD-10-CM | POA: Diagnosis not present

## 2014-02-06 DIAGNOSIS — M67919 Unspecified disorder of synovium and tendon, unspecified shoulder: Secondary | ICD-10-CM | POA: Diagnosis not present

## 2014-02-06 DIAGNOSIS — I2510 Atherosclerotic heart disease of native coronary artery without angina pectoris: Secondary | ICD-10-CM | POA: Diagnosis not present

## 2014-02-06 DIAGNOSIS — M719 Bursopathy, unspecified: Secondary | ICD-10-CM | POA: Diagnosis not present

## 2014-02-13 DIAGNOSIS — M67919 Unspecified disorder of synovium and tendon, unspecified shoulder: Secondary | ICD-10-CM | POA: Diagnosis not present

## 2014-02-13 DIAGNOSIS — M76899 Other specified enthesopathies of unspecified lower limb, excluding foot: Secondary | ICD-10-CM | POA: Diagnosis not present

## 2014-02-13 DIAGNOSIS — M159 Polyosteoarthritis, unspecified: Secondary | ICD-10-CM | POA: Diagnosis not present

## 2014-02-13 DIAGNOSIS — M719 Bursopathy, unspecified: Secondary | ICD-10-CM | POA: Diagnosis not present

## 2014-02-13 DIAGNOSIS — M2550 Pain in unspecified joint: Secondary | ICD-10-CM | POA: Diagnosis not present

## 2014-02-24 DIAGNOSIS — M159 Polyosteoarthritis, unspecified: Secondary | ICD-10-CM | POA: Diagnosis not present

## 2014-02-24 DIAGNOSIS — M2550 Pain in unspecified joint: Secondary | ICD-10-CM | POA: Diagnosis not present

## 2014-02-24 DIAGNOSIS — M719 Bursopathy, unspecified: Secondary | ICD-10-CM | POA: Diagnosis not present

## 2014-02-24 DIAGNOSIS — M25569 Pain in unspecified knee: Secondary | ICD-10-CM | POA: Diagnosis not present

## 2014-02-24 DIAGNOSIS — M67919 Unspecified disorder of synovium and tendon, unspecified shoulder: Secondary | ICD-10-CM | POA: Diagnosis not present

## 2014-03-10 DIAGNOSIS — I2510 Atherosclerotic heart disease of native coronary artery without angina pectoris: Secondary | ICD-10-CM | POA: Diagnosis not present

## 2014-03-12 DIAGNOSIS — IMO0002 Reserved for concepts with insufficient information to code with codable children: Secondary | ICD-10-CM | POA: Diagnosis not present

## 2014-03-12 DIAGNOSIS — I10 Essential (primary) hypertension: Secondary | ICD-10-CM | POA: Diagnosis not present

## 2014-03-12 DIAGNOSIS — I2510 Atherosclerotic heart disease of native coronary artery without angina pectoris: Secondary | ICD-10-CM | POA: Diagnosis not present

## 2014-03-12 DIAGNOSIS — E119 Type 2 diabetes mellitus without complications: Secondary | ICD-10-CM | POA: Diagnosis not present

## 2014-03-19 DIAGNOSIS — M25519 Pain in unspecified shoulder: Secondary | ICD-10-CM | POA: Diagnosis not present

## 2014-03-31 DIAGNOSIS — M79609 Pain in unspecified limb: Secondary | ICD-10-CM | POA: Diagnosis not present

## 2014-03-31 DIAGNOSIS — M549 Dorsalgia, unspecified: Secondary | ICD-10-CM | POA: Diagnosis not present

## 2014-03-31 DIAGNOSIS — M159 Polyosteoarthritis, unspecified: Secondary | ICD-10-CM | POA: Diagnosis not present

## 2014-03-31 DIAGNOSIS — R229 Localized swelling, mass and lump, unspecified: Secondary | ICD-10-CM | POA: Diagnosis not present

## 2014-04-10 DIAGNOSIS — R1310 Dysphagia, unspecified: Secondary | ICD-10-CM | POA: Diagnosis not present

## 2014-04-16 ENCOUNTER — Ambulatory Visit (INDEPENDENT_AMBULATORY_CARE_PROVIDER_SITE_OTHER): Payer: Self-pay | Admitting: Radiology

## 2014-04-16 ENCOUNTER — Ambulatory Visit (INDEPENDENT_AMBULATORY_CARE_PROVIDER_SITE_OTHER): Payer: Medicare Other | Admitting: Diagnostic Neuroimaging

## 2014-04-16 DIAGNOSIS — R209 Unspecified disturbances of skin sensation: Secondary | ICD-10-CM | POA: Diagnosis not present

## 2014-04-16 DIAGNOSIS — Z0289 Encounter for other administrative examinations: Secondary | ICD-10-CM

## 2014-04-18 ENCOUNTER — Telehealth: Payer: Self-pay | Admitting: Diagnostic Neuroimaging

## 2014-04-18 NOTE — Procedures (Signed)
   [facility]  NCS (NERVE CONDUCTION STUDY) WITH EMG (ELECTROMYOGRAPHY) REPORT   STUDY DATE: 04/16/14 PATIENT NAME: Jorge Sherman DOB: 1941/08/21 MRN: [id number]  ORDERING CLINICIAN: Roselle Locus  TECHNOLOGIST: Towana Badger ELECTROMYOGRAPHER: Earlean Polka. Penumalli, MD  CLINICAL INFORMATION: 73 year old male with left greater than right hand pain.  FINDINGS: NERVE CONDUCTION STUDY: Bilateral median motor responses have decreased amplitudes, prolonged distal latencies, slow conduction velocities and prolonged F-wave latencies. Right ulnar motor response is normal. Right ulnar F wave response has prolonged latency. Left ulnar motor response has decreased amplitude and slow conduction velocity with stimulation above elbow. Left ulnar F wave latency is prolonged. Bilateral median and ulnar sensory responses could not be obtained. Right radial sensory response has decreased amplitude and slow conduction velocity. Left radial sensory response has decreased amplitude and normal conduction velocity. Left sural sensory response is normal.  NEEDLE ELECTROMYOGRAPHY: Needle examination of left upper extremity (deltoid, biceps, triceps, flexor carpi radialis, first dorsal interosseous) and left C5-6 and left C7-T1 paraspinal muscles is normal.   IMPRESSION:  Abnormal study demonstrating widespread axonal sensorimotor polyneuropathy.    INTERPRETING PHYSICIAN:  Penni Bombard, MD Certified in Neurology, Neurophysiology and Neuroimaging  [facility] [address], [place], [place] [address] [phone]

## 2014-04-18 NOTE — Telephone Encounter (Signed)
Patient has a NCV/EMG done on Apr 16, 2014 and is now experiencing severe pain where the electrodes were placed and feeling nauseous?  Is this a side effect, please advise the patient.

## 2014-04-18 NOTE — Telephone Encounter (Signed)
I called patient. Having some pain/soreness. Still struggling with chronic pain issues. Follow up with pain mgmt and PCP. -VRP

## 2014-04-18 NOTE — Telephone Encounter (Signed)
Patient called and stated he's experiencing severe pain where electrodes were placed for NCV/EMG.  Pain medication is not helping and feeling very nauseous today.  Wanted to know if this was one of the side effects.  Please return call.

## 2014-04-23 DIAGNOSIS — G609 Hereditary and idiopathic neuropathy, unspecified: Secondary | ICD-10-CM | POA: Diagnosis not present

## 2014-04-23 DIAGNOSIS — I10 Essential (primary) hypertension: Secondary | ICD-10-CM | POA: Diagnosis not present

## 2014-04-23 DIAGNOSIS — E119 Type 2 diabetes mellitus without complications: Secondary | ICD-10-CM | POA: Diagnosis not present

## 2014-04-23 DIAGNOSIS — I2510 Atherosclerotic heart disease of native coronary artery without angina pectoris: Secondary | ICD-10-CM | POA: Diagnosis not present

## 2014-05-05 DIAGNOSIS — M545 Low back pain, unspecified: Secondary | ICD-10-CM | POA: Diagnosis not present

## 2014-05-05 DIAGNOSIS — M25519 Pain in unspecified shoulder: Secondary | ICD-10-CM | POA: Diagnosis not present

## 2014-05-05 DIAGNOSIS — F39 Unspecified mood [affective] disorder: Secondary | ICD-10-CM | POA: Diagnosis not present

## 2014-05-05 DIAGNOSIS — M542 Cervicalgia: Secondary | ICD-10-CM | POA: Diagnosis not present

## 2014-05-12 DIAGNOSIS — F3289 Other specified depressive episodes: Secondary | ICD-10-CM | POA: Diagnosis not present

## 2014-05-12 DIAGNOSIS — M545 Low back pain, unspecified: Secondary | ICD-10-CM | POA: Diagnosis not present

## 2014-05-12 DIAGNOSIS — F329 Major depressive disorder, single episode, unspecified: Secondary | ICD-10-CM | POA: Diagnosis not present

## 2014-05-12 DIAGNOSIS — I2510 Atherosclerotic heart disease of native coronary artery without angina pectoris: Secondary | ICD-10-CM | POA: Diagnosis not present

## 2014-05-12 DIAGNOSIS — E782 Mixed hyperlipidemia: Secondary | ICD-10-CM | POA: Diagnosis not present

## 2014-05-19 DIAGNOSIS — M542 Cervicalgia: Secondary | ICD-10-CM | POA: Diagnosis not present

## 2014-05-19 DIAGNOSIS — Z6836 Body mass index (BMI) 36.0-36.9, adult: Secondary | ICD-10-CM | POA: Diagnosis not present

## 2014-05-19 DIAGNOSIS — M5412 Radiculopathy, cervical region: Secondary | ICD-10-CM | POA: Diagnosis not present

## 2014-06-05 DIAGNOSIS — E119 Type 2 diabetes mellitus without complications: Secondary | ICD-10-CM | POA: Diagnosis not present

## 2014-06-05 DIAGNOSIS — I2510 Atherosclerotic heart disease of native coronary artery without angina pectoris: Secondary | ICD-10-CM | POA: Diagnosis not present

## 2014-06-11 DIAGNOSIS — E119 Type 2 diabetes mellitus without complications: Secondary | ICD-10-CM | POA: Diagnosis not present

## 2014-06-11 DIAGNOSIS — E782 Mixed hyperlipidemia: Secondary | ICD-10-CM | POA: Diagnosis not present

## 2014-06-11 DIAGNOSIS — I2510 Atherosclerotic heart disease of native coronary artery without angina pectoris: Secondary | ICD-10-CM | POA: Diagnosis not present

## 2014-06-11 DIAGNOSIS — I10 Essential (primary) hypertension: Secondary | ICD-10-CM | POA: Diagnosis not present

## 2014-06-13 ENCOUNTER — Other Ambulatory Visit: Payer: Self-pay | Admitting: Neurosurgery

## 2014-06-13 DIAGNOSIS — M5412 Radiculopathy, cervical region: Secondary | ICD-10-CM

## 2014-06-19 ENCOUNTER — Other Ambulatory Visit: Payer: Self-pay | Admitting: Dermatology

## 2014-06-19 DIAGNOSIS — L570 Actinic keratosis: Secondary | ICD-10-CM | POA: Diagnosis not present

## 2014-06-19 DIAGNOSIS — C44611 Basal cell carcinoma of skin of unspecified upper limb, including shoulder: Secondary | ICD-10-CM | POA: Diagnosis not present

## 2014-06-19 DIAGNOSIS — Z85828 Personal history of other malignant neoplasm of skin: Secondary | ICD-10-CM | POA: Diagnosis not present

## 2014-06-25 DIAGNOSIS — M719 Bursopathy, unspecified: Secondary | ICD-10-CM | POA: Diagnosis not present

## 2014-06-25 DIAGNOSIS — M159 Polyosteoarthritis, unspecified: Secondary | ICD-10-CM | POA: Diagnosis not present

## 2014-06-25 DIAGNOSIS — M76899 Other specified enthesopathies of unspecified lower limb, excluding foot: Secondary | ICD-10-CM | POA: Diagnosis not present

## 2014-06-25 DIAGNOSIS — M2550 Pain in unspecified joint: Secondary | ICD-10-CM | POA: Diagnosis not present

## 2014-06-25 DIAGNOSIS — M25569 Pain in unspecified knee: Secondary | ICD-10-CM | POA: Diagnosis not present

## 2014-06-25 DIAGNOSIS — M67919 Unspecified disorder of synovium and tendon, unspecified shoulder: Secondary | ICD-10-CM | POA: Diagnosis not present

## 2014-07-02 DIAGNOSIS — H2510 Age-related nuclear cataract, unspecified eye: Secondary | ICD-10-CM | POA: Diagnosis not present

## 2014-07-08 ENCOUNTER — Other Ambulatory Visit: Payer: Self-pay | Admitting: Dermatology

## 2014-07-08 DIAGNOSIS — C44611 Basal cell carcinoma of skin of unspecified upper limb, including shoulder: Secondary | ICD-10-CM | POA: Diagnosis not present

## 2014-07-08 DIAGNOSIS — Z85828 Personal history of other malignant neoplasm of skin: Secondary | ICD-10-CM | POA: Diagnosis not present

## 2014-07-08 DIAGNOSIS — L570 Actinic keratosis: Secondary | ICD-10-CM | POA: Diagnosis not present

## 2014-09-11 DIAGNOSIS — I10 Essential (primary) hypertension: Secondary | ICD-10-CM | POA: Diagnosis not present

## 2014-09-11 DIAGNOSIS — Z23 Encounter for immunization: Secondary | ICD-10-CM | POA: Diagnosis not present

## 2014-09-11 DIAGNOSIS — E782 Mixed hyperlipidemia: Secondary | ICD-10-CM | POA: Diagnosis not present

## 2014-09-11 DIAGNOSIS — I2510 Atherosclerotic heart disease of native coronary artery without angina pectoris: Secondary | ICD-10-CM | POA: Diagnosis not present

## 2014-09-11 DIAGNOSIS — Z0000 Encounter for general adult medical examination without abnormal findings: Secondary | ICD-10-CM | POA: Diagnosis not present

## 2014-09-11 DIAGNOSIS — Z125 Encounter for screening for malignant neoplasm of prostate: Secondary | ICD-10-CM | POA: Diagnosis not present

## 2014-09-11 DIAGNOSIS — E119 Type 2 diabetes mellitus without complications: Secondary | ICD-10-CM | POA: Diagnosis not present

## 2014-09-11 DIAGNOSIS — E162 Hypoglycemia, unspecified: Secondary | ICD-10-CM | POA: Diagnosis not present

## 2014-09-11 DIAGNOSIS — M2550 Pain in unspecified joint: Secondary | ICD-10-CM | POA: Diagnosis not present

## 2014-09-11 DIAGNOSIS — E1165 Type 2 diabetes mellitus with hyperglycemia: Secondary | ICD-10-CM | POA: Diagnosis not present

## 2014-09-22 DIAGNOSIS — E1165 Type 2 diabetes mellitus with hyperglycemia: Secondary | ICD-10-CM | POA: Diagnosis not present

## 2014-09-22 DIAGNOSIS — E782 Mixed hyperlipidemia: Secondary | ICD-10-CM | POA: Diagnosis not present

## 2014-09-22 DIAGNOSIS — I2510 Atherosclerotic heart disease of native coronary artery without angina pectoris: Secondary | ICD-10-CM | POA: Diagnosis not present

## 2014-09-22 DIAGNOSIS — I10 Essential (primary) hypertension: Secondary | ICD-10-CM | POA: Diagnosis not present

## 2014-12-24 DIAGNOSIS — M25569 Pain in unspecified knee: Secondary | ICD-10-CM | POA: Diagnosis not present

## 2014-12-24 DIAGNOSIS — M5136 Other intervertebral disc degeneration, lumbar region: Secondary | ICD-10-CM | POA: Diagnosis not present

## 2014-12-24 DIAGNOSIS — M2550 Pain in unspecified joint: Secondary | ICD-10-CM | POA: Diagnosis not present

## 2014-12-24 DIAGNOSIS — M150 Primary generalized (osteo)arthritis: Secondary | ICD-10-CM | POA: Diagnosis not present

## 2015-01-21 DIAGNOSIS — L570 Actinic keratosis: Secondary | ICD-10-CM | POA: Diagnosis not present

## 2015-01-21 DIAGNOSIS — L821 Other seborrheic keratosis: Secondary | ICD-10-CM | POA: Diagnosis not present

## 2015-01-21 DIAGNOSIS — Z85828 Personal history of other malignant neoplasm of skin: Secondary | ICD-10-CM | POA: Diagnosis not present

## 2015-01-21 DIAGNOSIS — L720 Epidermal cyst: Secondary | ICD-10-CM | POA: Diagnosis not present

## 2015-01-29 DIAGNOSIS — R351 Nocturia: Secondary | ICD-10-CM | POA: Diagnosis not present

## 2015-01-29 DIAGNOSIS — R339 Retention of urine, unspecified: Secondary | ICD-10-CM | POA: Diagnosis not present

## 2015-01-29 DIAGNOSIS — R3911 Hesitancy of micturition: Secondary | ICD-10-CM | POA: Diagnosis not present

## 2015-01-29 DIAGNOSIS — N401 Enlarged prostate with lower urinary tract symptoms: Secondary | ICD-10-CM | POA: Diagnosis not present

## 2015-01-29 DIAGNOSIS — R3912 Poor urinary stream: Secondary | ICD-10-CM | POA: Diagnosis not present

## 2015-01-30 DIAGNOSIS — K219 Gastro-esophageal reflux disease without esophagitis: Secondary | ICD-10-CM | POA: Diagnosis not present

## 2015-01-30 DIAGNOSIS — R110 Nausea: Secondary | ICD-10-CM | POA: Diagnosis not present

## 2015-02-04 ENCOUNTER — Telehealth: Payer: Self-pay | Admitting: Interventional Cardiology

## 2015-02-04 NOTE — Telephone Encounter (Signed)
.  Pt c/o BP issue: STAT if pt c/o blurred vision, one-sided weakness or slurred speech  1. What are your last 5 BP readings? No readings   2. Are you having any other symptoms (ex. Dizziness, headache, blurred vision, passed out)? Dizziness  3. Comments:  Pt reports he is having dizzy spells and finds that he is rather unsteady on his feet. Believes he may need a sooner appt.   He states he constantly takes his BP. everything looks normal per pt. He reports It waivers low to hgh.. No readings listed. Pt has an appt for 04/11. Requests a sooner appt. Please assist

## 2015-02-05 NOTE — Telephone Encounter (Signed)
returned pt call. lmtcb 

## 2015-02-26 DIAGNOSIS — N138 Other obstructive and reflux uropathy: Secondary | ICD-10-CM | POA: Diagnosis not present

## 2015-02-26 DIAGNOSIS — R339 Retention of urine, unspecified: Secondary | ICD-10-CM | POA: Diagnosis not present

## 2015-02-26 DIAGNOSIS — R3911 Hesitancy of micturition: Secondary | ICD-10-CM | POA: Diagnosis not present

## 2015-02-26 DIAGNOSIS — N401 Enlarged prostate with lower urinary tract symptoms: Secondary | ICD-10-CM | POA: Diagnosis not present

## 2015-03-02 ENCOUNTER — Ambulatory Visit (INDEPENDENT_AMBULATORY_CARE_PROVIDER_SITE_OTHER): Payer: Medicare Other | Admitting: Interventional Cardiology

## 2015-03-02 ENCOUNTER — Encounter: Payer: Self-pay | Admitting: Interventional Cardiology

## 2015-03-02 VITALS — BP 124/62 | HR 82 | Ht 71.0 in | Wt 258.8 lb

## 2015-03-02 DIAGNOSIS — G4733 Obstructive sleep apnea (adult) (pediatric): Secondary | ICD-10-CM | POA: Diagnosis not present

## 2015-03-02 DIAGNOSIS — I2510 Atherosclerotic heart disease of native coronary artery without angina pectoris: Secondary | ICD-10-CM | POA: Diagnosis not present

## 2015-03-02 DIAGNOSIS — R42 Dizziness and giddiness: Secondary | ICD-10-CM

## 2015-03-02 DIAGNOSIS — I10 Essential (primary) hypertension: Secondary | ICD-10-CM

## 2015-03-02 NOTE — Progress Notes (Signed)
Cardiology Office Note   Date:  03/02/2015   ID:  Jorge Sherman, DOB May 13, 1941, MRN [id number]  PCP:  Merrilee Seashore, MD  Cardiologist:   Jorge Grooms, MD   No chief complaint on file.     History of Present Illness: Jorge Sherman is a 74 y.o. male who presents for coronary artery disease with prior LAD stent and essential hypertension. Also known to have obstructive sleep apnea. Chronic pain syndrome related to thoracic and lumbar disc disease  The patient is doing well from a cardiac standpoint. He denies angina. He has not had palpitations, syncope, orthopnea, PND, palpitations or other symptoms. There is occasional lower extremity swelling.    Past Medical History  Diagnosis Date  . Dizziness and giddiness   . Pain in joint, lower leg   . Unspecified essential hypertension   . Leukocytosis, unspecified   . Type II or unspecified type diabetes mellitus without mention of complication, not stated as uncontrolled   . Obstructive sleep apnea (adult) (pediatric)   . Depressive disorder, not elsewhere classified   . Essential and other specified forms of tremor   . Unspecified cardiovascular disease   . Headache(784.0)   . Spinal stenosis, lumbar region, without neurogenic claudication   . Thoracic or lumbosacral neuritis or radiculitis, unspecified   . Chronic pain     Past Surgical History  Procedure Laterality Date  . Back surgey      times 3  . Back surgery       Current Outpatient Prescriptions  Medication Sig Dispense Refill  . aspirin 325 MG tablet Take 325 mg by mouth daily.    . celecoxib (CELEBREX) 200 MG capsule Take 200 mg by mouth 2 (two) times daily.    . diazepam (VALIUM) 10 MG tablet Take 10 mg by mouth 2 (two) times daily.    . DULoxetine (CYMBALTA) 30 MG capsule Take 90 mg by mouth daily.    [name] ezetimibe (ZETIA) 10 MG tablet Take 10 mg by mouth daily.    . furosemide (LASIX) 20 MG tablet Take 20 mg by mouth daily.    [name] gabapentin  (NEURONTIN) 800 MG tablet Take 800 mg by mouth 4 (four) times daily.    . hydroxychloroquine (PLAQUENIL) 200 MG tablet Take 200 mg by mouth daily.  3  . lidocaine (LIDODERM) 5 % Place 3 patches onto the skin daily. Remove & Discard patch within 12 hours or as directed by MD    . lisinopril (PRINIVIL,ZESTRIL) 40 MG tablet Take 40 mg by mouth daily.    [name] LORazepam (ATIVAN) 1 MG tablet Take 2 mg by mouth at bedtime as needed for anxiety.    . methocarbamol (ROBAXIN) 500 MG tablet Take 500 mg by mouth 3 (three) times daily.    . multivitamin (METANX) 3-35-2 MG TABS tablet Take 1 tablet by mouth 2 (two) times daily.    [name] oxyCODONE (OXYCONTIN) 80 MG 12 hr tablet Take 80 mg by mouth every 12 (twelve) hours.    . OxyCODONE (OXYCONTIN) 80 mg T12A 12 hr tablet Take 80 mg by mouth every 12 (twelve) hours.    [name] oxyCODONE (ROXICODONE) 15 MG immediate release tablet Take 15 mg by mouth every 6 (six) hours as needed for pain.    . OXYCODONE HCL PO Take 15 mg by mouth. Two tabs twice daily    . oxyCODONE-acetaminophen (PERCOCET/ROXICET) 5-325 MG per tablet Take 1 tablet by mouth every 6 (six) hours as needed for severe  pain. 15 tablet 0  . rosuvastatin (CRESTOR) 20 MG tablet Take 20 mg by mouth daily.    [name] senna (SENOKOT) 8.6 MG TABS Take 1 tablet by mouth 4 (four) times daily.     No current facility-administered medications for this visit.    Allergies:   Iohexol and Iodine    Social History:  The patient  reports that he does not drink alcohol or use illicit drugs.   Family History:  The patient's family history includes Cancer in his father; Diabetes in his mother; Heart disease in his father; Stroke in his mother.    ROS:  Please see the history of present illness.   Otherwise, review of systems are positive for discomfort in the back. Otherwise unremarkable..   All other systems are reviewed and negative.    PHYSICAL EXAM: VS:  BP 124/62 mmHg  Pulse 82  Ht 5\' 11"  (1.803 m)  Wt 258 lb 12.8 oz  (117.391 kg)  BMI 36.11 kg/m2 , BMI Body mass index is 36.11 kg/(m^2). GEN: Well nourished, well developed, in no acute distress HEENT: normal Neck: no JVD, carotid bruits, or masses Cardiac: RRR; no murmurs, rubs, or gallops,no edema  Respiratory:  clear to auscultation bilaterally, normal work of breathing GI: soft, nontender, nondistended, + BS MS: no deformity or atrophy Skin: warm and dry, no rash Neuro:  Strength and sensation are intact Psych: euthymic mood, full affect   EKG:  EKG is ordered today. The ekg ordered today demonstrates sinus rhythm with first-degree AV block. Otherwise unremarkable.   Recent Labs: No results found for requested labs within last 365 days.    Lipid Panel No results found for: CHOL, TRIG, HDL, CHOLHDL, VLDL, LDLCALC, LDLDIRECT    Wt Readings from Last 3 Encounters:  03/02/15 258 lb 12.8 oz (117.391 kg)  10/30/13 242 lb (109.77 kg)      Other studies Reviewed: Additional studies/ records that were reviewed today include: . Review of the above records demonstrates: Reviewed and no significant findings.   ASSESSMENT AND PLAN:  Atherosclerosis of native coronary artery of native heart without angina pectoris: Asymptomatic  Essential hypertension, benign: Stable  Dizziness and giddiness     Current medicines are reviewed at length with the patient today.  The patient does not have concerns regarding medicines.  The following changes have been made:  no change  Labs/ tests ordered today include:  No orders of the defined types were placed in this encounter.     Disposition:   FU with Linard Millers in 12  months   Signed, Jorge Grooms, MD  03/02/2015 5:08 [facility] [place], [place],   [address] Phone: [phone]; Fax: [phone]

## 2015-03-02 NOTE — Patient Instructions (Signed)
Your physician recommends that you continue on your current medications as directed. Please refer to the Current Medication list given to you today.  Your physician wants you to follow-up in: 1 year with Dr.Smith You will receive a reminder letter in the mail two months in advance. If you don't receive a letter, please call our office to schedule the follow-up appointment.  

## 2015-03-15 ENCOUNTER — Encounter (HOSPITAL_BASED_OUTPATIENT_CLINIC_OR_DEPARTMENT_OTHER): Payer: Self-pay | Admitting: *Deleted

## 2015-03-15 ENCOUNTER — Other Ambulatory Visit: Payer: Self-pay

## 2015-03-15 ENCOUNTER — Emergency Department (HOSPITAL_BASED_OUTPATIENT_CLINIC_OR_DEPARTMENT_OTHER)
Admission: EM | Admit: 2015-03-15 | Discharge: 2015-03-15 | Discharge status: Against Medical Advice | Payer: Medicare Other | Attending: Emergency Medicine | Admitting: Emergency Medicine

## 2015-03-15 DIAGNOSIS — R42 Dizziness and giddiness: Secondary | ICD-10-CM | POA: Insufficient documentation

## 2015-03-15 DIAGNOSIS — R61 Generalized hyperhidrosis: Secondary | ICD-10-CM | POA: Diagnosis not present

## 2015-03-15 DIAGNOSIS — G8929 Other chronic pain: Secondary | ICD-10-CM | POA: Insufficient documentation

## 2015-03-15 DIAGNOSIS — I10 Essential (primary) hypertension: Secondary | ICD-10-CM | POA: Insufficient documentation

## 2015-03-15 DIAGNOSIS — E119 Type 2 diabetes mellitus without complications: Secondary | ICD-10-CM | POA: Insufficient documentation

## 2015-03-15 DIAGNOSIS — R531 Weakness: Secondary | ICD-10-CM | POA: Insufficient documentation

## 2015-03-15 DIAGNOSIS — I2510 Atherosclerotic heart disease of native coronary artery without angina pectoris: Secondary | ICD-10-CM | POA: Insufficient documentation

## 2015-03-15 NOTE — ED Notes (Signed)
Patient here with 3 weeks of positional dizziness, reports increased weakness and history of multiple back surgeries. Reports diaphoresis with same.

## 2015-03-16 DIAGNOSIS — E782 Mixed hyperlipidemia: Secondary | ICD-10-CM | POA: Diagnosis not present

## 2015-03-16 DIAGNOSIS — R6883 Chills (without fever): Secondary | ICD-10-CM | POA: Diagnosis not present

## 2015-03-16 DIAGNOSIS — R5383 Other fatigue: Secondary | ICD-10-CM | POA: Diagnosis not present

## 2015-03-16 DIAGNOSIS — I10 Essential (primary) hypertension: Secondary | ICD-10-CM | POA: Diagnosis not present

## 2015-03-16 DIAGNOSIS — E1165 Type 2 diabetes mellitus with hyperglycemia: Secondary | ICD-10-CM | POA: Diagnosis not present

## 2015-03-16 DIAGNOSIS — I959 Hypotension, unspecified: Secondary | ICD-10-CM | POA: Diagnosis not present

## 2015-03-27 DIAGNOSIS — M25552 Pain in left hip: Secondary | ICD-10-CM | POA: Diagnosis not present

## 2015-03-30 DIAGNOSIS — G603 Idiopathic progressive neuropathy: Secondary | ICD-10-CM | POA: Diagnosis not present

## 2015-03-30 DIAGNOSIS — R42 Dizziness and giddiness: Secondary | ICD-10-CM | POA: Diagnosis not present

## 2015-03-30 DIAGNOSIS — I2510 Atherosclerotic heart disease of native coronary artery without angina pectoris: Secondary | ICD-10-CM | POA: Diagnosis not present

## 2015-03-30 DIAGNOSIS — E1165 Type 2 diabetes mellitus with hyperglycemia: Secondary | ICD-10-CM | POA: Diagnosis not present

## 2015-04-14 DIAGNOSIS — C44329 Squamous cell carcinoma of skin of other parts of face: Secondary | ICD-10-CM | POA: Diagnosis not present

## 2015-04-14 DIAGNOSIS — Z85828 Personal history of other malignant neoplasm of skin: Secondary | ICD-10-CM | POA: Diagnosis not present

## 2015-04-14 DIAGNOSIS — B029 Zoster without complications: Secondary | ICD-10-CM | POA: Diagnosis not present

## 2015-04-14 DIAGNOSIS — L304 Erythema intertrigo: Secondary | ICD-10-CM | POA: Diagnosis not present

## 2015-04-14 DIAGNOSIS — C4442 Squamous cell carcinoma of skin of scalp and neck: Secondary | ICD-10-CM | POA: Diagnosis not present

## 2015-04-28 DIAGNOSIS — Z85828 Personal history of other malignant neoplasm of skin: Secondary | ICD-10-CM | POA: Diagnosis not present

## 2015-04-28 DIAGNOSIS — B029 Zoster without complications: Secondary | ICD-10-CM | POA: Diagnosis not present

## 2015-04-28 DIAGNOSIS — M792 Neuralgia and neuritis, unspecified: Secondary | ICD-10-CM | POA: Diagnosis not present

## 2015-05-05 DIAGNOSIS — Z85828 Personal history of other malignant neoplasm of skin: Secondary | ICD-10-CM | POA: Diagnosis not present

## 2015-05-05 DIAGNOSIS — C4442 Squamous cell carcinoma of skin of scalp and neck: Secondary | ICD-10-CM | POA: Diagnosis not present

## 2015-05-14 DIAGNOSIS — B0229 Other postherpetic nervous system involvement: Secondary | ICD-10-CM | POA: Diagnosis not present

## 2015-05-29 DIAGNOSIS — B029 Zoster without complications: Secondary | ICD-10-CM | POA: Diagnosis not present

## 2015-06-23 DIAGNOSIS — M25569 Pain in unspecified knee: Secondary | ICD-10-CM | POA: Diagnosis not present

## 2015-06-26 DIAGNOSIS — T23162A Burn of first degree of back of left hand, initial encounter: Secondary | ICD-10-CM | POA: Diagnosis not present

## 2015-06-29 DIAGNOSIS — K5900 Constipation, unspecified: Secondary | ICD-10-CM | POA: Diagnosis not present

## 2015-06-29 DIAGNOSIS — R109 Unspecified abdominal pain: Secondary | ICD-10-CM | POA: Diagnosis not present

## 2015-07-07 DIAGNOSIS — Z87891 Personal history of nicotine dependence: Secondary | ICD-10-CM | POA: Diagnosis not present

## 2015-07-07 DIAGNOSIS — M170 Bilateral primary osteoarthritis of knee: Secondary | ICD-10-CM | POA: Diagnosis not present

## 2015-07-07 DIAGNOSIS — Z7982 Long term (current) use of aspirin: Secondary | ICD-10-CM | POA: Diagnosis not present

## 2015-07-25 DIAGNOSIS — T63301A Toxic effect of unspecified spider venom, accidental (unintentional), initial encounter: Secondary | ICD-10-CM | POA: Diagnosis not present

## 2015-07-25 DIAGNOSIS — L03319 Cellulitis of trunk, unspecified: Secondary | ICD-10-CM | POA: Diagnosis not present

## 2015-07-26 DIAGNOSIS — T63301D Toxic effect of unspecified spider venom, accidental (unintentional), subsequent encounter: Secondary | ICD-10-CM | POA: Diagnosis not present

## 2015-07-26 DIAGNOSIS — L03319 Cellulitis of trunk, unspecified: Secondary | ICD-10-CM | POA: Diagnosis not present

## 2015-07-28 DIAGNOSIS — R0602 Shortness of breath: Secondary | ICD-10-CM | POA: Diagnosis not present

## 2015-07-28 DIAGNOSIS — S30811D Abrasion of abdominal wall, subsequent encounter: Secondary | ICD-10-CM | POA: Diagnosis not present

## 2015-07-29 DIAGNOSIS — E1165 Type 2 diabetes mellitus with hyperglycemia: Secondary | ICD-10-CM | POA: Diagnosis not present

## 2015-07-29 DIAGNOSIS — I2510 Atherosclerotic heart disease of native coronary artery without angina pectoris: Secondary | ICD-10-CM | POA: Diagnosis not present

## 2015-08-20 ENCOUNTER — Telehealth: Payer: Self-pay | Admitting: Interventional Cardiology

## 2015-08-20 NOTE — Telephone Encounter (Signed)
New message  Pt called states that he was advised to call in when he was ready to receive knee surgery. Informed pt to have clearance faxed in.

## 2015-10-08 ENCOUNTER — Encounter (HOSPITAL_COMMUNITY): Payer: Self-pay | Admitting: *Deleted

## 2015-10-08 ENCOUNTER — Emergency Department (HOSPITAL_COMMUNITY)
Admission: EM | Admit: 2015-10-08 | Discharge: 2015-10-08 | Disposition: A | Discharge status: Home / Self Care | Payer: Medicare Other | Attending: Emergency Medicine | Admitting: Emergency Medicine

## 2015-10-08 DIAGNOSIS — Y998 Other external cause status: Secondary | ICD-10-CM | POA: Diagnosis not present

## 2015-10-08 DIAGNOSIS — W01198A Fall on same level from slipping, tripping and stumbling with subsequent striking against other object, initial encounter: Secondary | ICD-10-CM | POA: Diagnosis not present

## 2015-10-08 DIAGNOSIS — W19XXXA Unspecified fall, initial encounter: Secondary | ICD-10-CM

## 2015-10-08 DIAGNOSIS — S79912A Unspecified injury of left hip, initial encounter: Secondary | ICD-10-CM | POA: Diagnosis not present

## 2015-10-08 DIAGNOSIS — Y92002 Bathroom of unspecified non-institutional (private) residence single-family (private) house as the place of occurrence of the external cause: Secondary | ICD-10-CM | POA: Insufficient documentation

## 2015-10-08 DIAGNOSIS — Z791 Long term (current) use of non-steroidal anti-inflammatories (NSAID): Secondary | ICD-10-CM | POA: Insufficient documentation

## 2015-10-08 DIAGNOSIS — Z862 Personal history of diseases of the blood and blood-forming organs and certain disorders involving the immune mechanism: Secondary | ICD-10-CM | POA: Diagnosis not present

## 2015-10-08 DIAGNOSIS — F329 Major depressive disorder, single episode, unspecified: Secondary | ICD-10-CM | POA: Diagnosis not present

## 2015-10-08 DIAGNOSIS — S8992XA Unspecified injury of left lower leg, initial encounter: Secondary | ICD-10-CM | POA: Diagnosis not present

## 2015-10-08 DIAGNOSIS — Z7982 Long term (current) use of aspirin: Secondary | ICD-10-CM | POA: Insufficient documentation

## 2015-10-08 DIAGNOSIS — E119 Type 2 diabetes mellitus without complications: Secondary | ICD-10-CM | POA: Insufficient documentation

## 2015-10-08 DIAGNOSIS — I10 Essential (primary) hypertension: Secondary | ICD-10-CM | POA: Diagnosis not present

## 2015-10-08 DIAGNOSIS — S59902A Unspecified injury of left elbow, initial encounter: Secondary | ICD-10-CM | POA: Diagnosis not present

## 2015-10-08 DIAGNOSIS — R0781 Pleurodynia: Secondary | ICD-10-CM | POA: Diagnosis not present

## 2015-10-08 DIAGNOSIS — S2190XA Unspecified open wound of unspecified part of thorax, initial encounter: Secondary | ICD-10-CM | POA: Diagnosis not present

## 2015-10-08 DIAGNOSIS — G8929 Other chronic pain: Secondary | ICD-10-CM | POA: Diagnosis not present

## 2015-10-08 DIAGNOSIS — M25569 Pain in unspecified knee: Secondary | ICD-10-CM | POA: Diagnosis not present

## 2015-10-08 DIAGNOSIS — Y9389 Activity, other specified: Secondary | ICD-10-CM | POA: Insufficient documentation

## 2015-10-08 DIAGNOSIS — Z79899 Other long term (current) drug therapy: Secondary | ICD-10-CM | POA: Diagnosis not present

## 2015-10-08 DIAGNOSIS — Z8739 Personal history of other diseases of the musculoskeletal system and connective tissue: Secondary | ICD-10-CM | POA: Diagnosis not present

## 2015-10-08 DIAGNOSIS — M25562 Pain in left knee: Secondary | ICD-10-CM | POA: Diagnosis not present

## 2015-10-08 NOTE — ED Provider Notes (Signed)
CSN: [id number]     Arrival date & time 10/08/15  1202 History   First MD Initiated Contact with Patient 10/08/15 1223     Chief Complaint  Patient presents with  . Fall  . Knee Pain  . Hip Pain  . Rib Injury  . Chest Pain     (Consider location/radiation/quality/duration/timing/severity/associated sxs/prior Treatment) Patient is a 74 y.o. male presenting with fall.  Fall This is a new problem. The current episode started 3 to 5 hours ago. The problem has not changed since onset.Pertinent negatives include no chest pain, no abdominal pain, no headaches and no shortness of breath. Nothing aggravates the symptoms. Nothing relieves the symptoms. The treatment provided no relief.    Past Medical History  Diagnosis Date  . Dizziness and giddiness   . Pain in joint, lower leg   . Unspecified essential hypertension   . Leukocytosis, unspecified   . Type II or unspecified type diabetes mellitus without mention of complication, not stated as uncontrolled   . Obstructive sleep apnea (adult) (pediatric)   . Depressive disorder, not elsewhere classified   . Essential and other specified forms of tremor   . Unspecified cardiovascular disease   . Headache(784.0)   . Spinal stenosis, lumbar region, without neurogenic claudication   . Thoracic or lumbosacral neuritis or radiculitis, unspecified   . Chronic pain    Past Surgical History  Procedure Laterality Date  . Back surgey      times 3  . Back surgery     Family History  Problem Relation Age of Onset  . Cancer Father   . Heart disease Father   . Stroke Mother   . Diabetes Mother    Social History  Substance Use Topics  . Smoking status: Unknown If Ever Smoked  . Smokeless tobacco: None  . Alcohol Use: No    Review of Systems  Constitutional: Negative for fever, chills and activity change.  HENT: Negative for congestion and rhinorrhea.   Eyes: Negative for visual disturbance.  Respiratory: Negative for cough and  shortness of breath.   Cardiovascular: Negative for chest pain.  Gastrointestinal: Negative for vomiting, abdominal pain, diarrhea and constipation.  Endocrine: Negative for polyuria.  Genitourinary: Negative for dysuria and flank pain.  Musculoskeletal: Negative for back pain and neck pain.       Left elbow, knee and hip pain initially. Resolved now.   Skin: Negative for wound.  Neurological: Negative for headaches.  All other systems reviewed and are negative.     Allergies  Iohexol and Iodine  Home Medications   Prior to Admission medications   Medication Sig Start Date End Date Taking? Authorizing Provider  aspirin 325 MG tablet Take 325 mg by mouth daily.    Historical Provider, MD  celecoxib (CELEBREX) 200 MG capsule Take 200 mg by mouth 2 (two) times daily.    Historical Provider, MD  diazepam (VALIUM) 10 MG tablet Take 10 mg by mouth 2 (two) times daily.    Historical Provider, MD  DULoxetine (CYMBALTA) 30 MG capsule Take 90 mg by mouth daily.    Historical Provider, MD  ezetimibe (ZETIA) 10 MG tablet Take 10 mg by mouth daily.    Historical Provider, MD  furosemide (LASIX) 20 MG tablet Take 20 mg by mouth daily.    Historical Provider, MD  gabapentin (NEURONTIN) 800 MG tablet Take 800 mg by mouth 4 (four) times daily.    Historical Provider, MD  hydroxychloroquine (PLAQUENIL) 200 MG tablet Take 200  mg by mouth daily. 02/06/15   Historical Provider, MD  lidocaine (LIDODERM) 5 % Place 3 patches onto the skin daily. Remove & Discard patch within 12 hours or as directed by MD    Historical Provider, MD  lisinopril (PRINIVIL,ZESTRIL) 40 MG tablet Take 40 mg by mouth daily.    Historical Provider, MD  LORazepam (ATIVAN) 1 MG tablet Take 2 mg by mouth at bedtime as needed for anxiety.    Historical Provider, MD  methocarbamol (ROBAXIN) 500 MG tablet Take 500 mg by mouth 3 (three) times daily.    Historical Provider, MD  multivitamin (METANX) 3-35-2 MG TABS tablet Take 1 tablet by  mouth 2 (two) times daily.    Historical Provider, MD  oxyCODONE (OXYCONTIN) 80 MG 12 hr tablet Take 80 mg by mouth every 12 (twelve) hours.    Historical Provider, MD  OxyCODONE (OXYCONTIN) 80 mg T12A 12 hr tablet Take 80 mg by mouth every 12 (twelve) hours.    Historical Provider, MD  oxyCODONE (ROXICODONE) 15 MG immediate release tablet Take 15 mg by mouth every 6 (six) hours as needed for pain.    Historical Provider, MD  OXYCODONE HCL PO Take 15 mg by mouth. Two tabs twice daily    Historical Provider, MD  oxyCODONE-acetaminophen (PERCOCET/ROXICET) 5-325 MG per tablet Take 1 tablet by mouth every 6 (six) hours as needed for severe pain. 12/04/13   Teressa Lower, MD  rosuvastatin (CRESTOR) 20 MG tablet Take 20 mg by mouth daily.    Historical Provider, MD  senna (SENOKOT) 8.6 MG TABS Take 1 tablet by mouth 4 (four) times daily.    Historical Provider, MD   BP 110/78 mmHg  Pulse 90  Temp(Src) 98.2 F (36.8 C) (Oral)  Resp 18  SpO2 94% Physical Exam  Constitutional: He is oriented to person, place, and time. He appears well-developed and well-nourished.  HENT:  Head: Normocephalic and atraumatic.  Neck: Normal range of motion.  Cardiovascular: Normal rate.   Pulmonary/Chest: Effort normal. No respiratory distress. He exhibits no tenderness.  Abdominal: Soft. Bowel sounds are normal. He exhibits no distension. There is no tenderness.  Musculoskeletal: Normal range of motion. He exhibits no edema or tenderness (of any extremity).  Neurological: He is alert and oriented to person, place, and time.  Nursing note and vitals reviewed.   ED Course  Procedures (including critical care time) Labs Review Labs Reviewed - No data to display  Imaging Review No results found. I have personally reviewed and evaluated these images and lab results as part of my medical decision-making.   EKG Interpretation   Date/Time:  Thursday October 08 2015 12:15:54 EST Ventricular Rate:  88 PR Interval:   218 QRS Duration: 100 QT Interval:  370 QTC Calculation: 448 R Axis:   -32 Text Interpretation:  Sinus rhythm Borderline prolonged PR interval Left  axis deviation ED PHYSICIAN INTERPRETATION AVAILABLE IN [facility]  Confirmed by TEST, Record ([id number]) on 10/09/2015 7:10:01 AM      MDM   Final diagnoses:  Fall, initial encounter  Knee pain, chronic, left   74 year old male who had a mechanical fall home initially had some knee elbow and hip pain however this resolved prior to my evaluation. Patient wants no x-rays and is ready to go home. On palpation he has no tenderness or deformities anywhere and is able to ambulate at baseline and perform ADLs at baseline. I would lean towards x-rays of the affected body parts however he does not want them  and wants to go home so patient was discharged. He will return if symptoms return.      Merrily Pew, MD 10/10/15 1754

## 2015-10-08 NOTE — ED Notes (Addendum)
Per EMS, pt fell while in his bathroom after slipping on the wet floor. Pt states he fell to his knees and hit his right ribs on the bathtub. Pt complains of pain in his left hip, right ribcage, and bilateral knees. Pt denies head injury/loss of consciousness.   Pt states his chest feels tight/shortness of breath at this time. MD notified.

## 2015-10-12 ENCOUNTER — Encounter (HOSPITAL_COMMUNITY): Payer: Self-pay | Admitting: Neurology

## 2015-10-12 ENCOUNTER — Emergency Department (HOSPITAL_COMMUNITY): Payer: Medicare Other

## 2015-10-12 ENCOUNTER — Emergency Department (HOSPITAL_COMMUNITY)
Admission: EM | Admit: 2015-10-12 | Discharge: 2015-10-12 | Disposition: A | Discharge status: Home / Self Care | Payer: Medicare Other | Attending: Emergency Medicine | Admitting: Emergency Medicine

## 2015-10-12 DIAGNOSIS — G8929 Other chronic pain: Secondary | ICD-10-CM | POA: Insufficient documentation

## 2015-10-12 DIAGNOSIS — W010XXS Fall on same level from slipping, tripping and stumbling without subsequent striking against object, sequela: Secondary | ICD-10-CM | POA: Insufficient documentation

## 2015-10-12 DIAGNOSIS — Z79899 Other long term (current) drug therapy: Secondary | ICD-10-CM | POA: Insufficient documentation

## 2015-10-12 DIAGNOSIS — S8992XS Unspecified injury of left lower leg, sequela: Secondary | ICD-10-CM | POA: Diagnosis not present

## 2015-10-12 DIAGNOSIS — S8991XS Unspecified injury of right lower leg, sequela: Secondary | ICD-10-CM | POA: Insufficient documentation

## 2015-10-12 DIAGNOSIS — S299XXS Unspecified injury of thorax, sequela: Secondary | ICD-10-CM | POA: Insufficient documentation

## 2015-10-12 DIAGNOSIS — S8002XA Contusion of left knee, initial encounter: Secondary | ICD-10-CM | POA: Diagnosis not present

## 2015-10-12 DIAGNOSIS — E119 Type 2 diabetes mellitus without complications: Secondary | ICD-10-CM | POA: Insufficient documentation

## 2015-10-12 DIAGNOSIS — F329 Major depressive disorder, single episode, unspecified: Secondary | ICD-10-CM | POA: Diagnosis not present

## 2015-10-12 DIAGNOSIS — Z8739 Personal history of other diseases of the musculoskeletal system and connective tissue: Secondary | ICD-10-CM | POA: Insufficient documentation

## 2015-10-12 DIAGNOSIS — S8991XA Unspecified injury of right lower leg, initial encounter: Secondary | ICD-10-CM | POA: Diagnosis not present

## 2015-10-12 DIAGNOSIS — Z791 Long term (current) use of non-steroidal anti-inflammatories (NSAID): Secondary | ICD-10-CM | POA: Diagnosis not present

## 2015-10-12 DIAGNOSIS — W19XXXS Unspecified fall, sequela: Secondary | ICD-10-CM

## 2015-10-12 DIAGNOSIS — M25561 Pain in right knee: Secondary | ICD-10-CM | POA: Diagnosis not present

## 2015-10-12 DIAGNOSIS — R079 Chest pain, unspecified: Secondary | ICD-10-CM

## 2015-10-12 DIAGNOSIS — G4733 Obstructive sleep apnea (adult) (pediatric): Secondary | ICD-10-CM | POA: Insufficient documentation

## 2015-10-12 DIAGNOSIS — S299XXA Unspecified injury of thorax, initial encounter: Secondary | ICD-10-CM | POA: Diagnosis not present

## 2015-10-12 DIAGNOSIS — M25562 Pain in left knee: Secondary | ICD-10-CM | POA: Diagnosis not present

## 2015-10-12 DIAGNOSIS — Z7982 Long term (current) use of aspirin: Secondary | ICD-10-CM | POA: Diagnosis not present

## 2015-10-12 DIAGNOSIS — R0789 Other chest pain: Secondary | ICD-10-CM | POA: Diagnosis not present

## 2015-10-12 LAB — CBC
HEMATOCRIT: 34.8 % — AB (ref 39.0–52.0)
Hemoglobin: 11.2 g/dL — ABNORMAL LOW (ref 13.0–17.0)
MCH: 27.7 pg (ref 26.0–34.0)
MCHC: 32.2 g/dL (ref 30.0–36.0)
MCV: 85.9 fL (ref 78.0–100.0)
Platelets: 226 10*3/uL (ref 150–400)
RBC: 4.05 MIL/uL — ABNORMAL LOW (ref 4.22–5.81)
RDW: 13.1 % (ref 11.5–15.5)
WBC: 10.3 10*3/uL (ref 4.0–10.5)

## 2015-10-12 LAB — BASIC METABOLIC PANEL
Anion gap: 10 (ref 5–15)
BUN: 18 mg/dL (ref 6–20)
CO2: 28 mmol/L (ref 22–32)
Calcium: 9.2 mg/dL (ref 8.9–10.3)
Chloride: 95 mmol/L — ABNORMAL LOW (ref 101–111)
Creatinine, Ser: 1.07 mg/dL (ref 0.61–1.24)
Glucose, Bld: 145 mg/dL — ABNORMAL HIGH (ref 65–99)
POTASSIUM: 4.3 mmol/L (ref 3.5–5.1)
Sodium: 133 mmol/L — ABNORMAL LOW (ref 135–145)

## 2015-10-12 LAB — I-STAT TROPONIN, ED
Troponin i, poc: 0 ng/mL (ref 0.00–0.08)
Troponin i, poc: 0 ng/mL (ref 0.00–0.08)

## 2015-10-12 MED ORDER — OXYCODONE-ACETAMINOPHEN 5-325 MG PO TABS
1.0000 | ORAL_TABLET | Freq: Once | ORAL | Status: AC
  Administered 2015-10-12: 1 via ORAL
  Filled 2015-10-12: qty 1

## 2015-10-12 MED ORDER — KETOROLAC TROMETHAMINE 60 MG/2ML IM SOLN
60.0000 mg | Freq: Once | INTRAMUSCULAR | Status: AC
  Administered 2015-10-12: 60 mg via INTRAMUSCULAR
  Filled 2015-10-12: qty 2

## 2015-10-12 NOTE — ED Notes (Addendum)
Per ems- pt comes from home where he was sitting in recliner and slide out of his chair landing on his bottom. C/o lower back pain which he has hx of multiple back surgeries. In route, pt developed left sided cp, unsure if it is indigestion. Given 324 aspirin. Pt is a x 4. Also reports he fell this weekend at his apartment but did not get scene because the wait was too long. BP 124/77, HR 80, 96% RA, CBG 131.

## 2015-10-12 NOTE — Discharge Instructions (Signed)
Chest Pain Observation °It is often hard to give a specific diagnosis for the cause of chest pain. Among other possibilities your symptoms might be caused by inadequate oxygen delivery to your heart (angina). Angina that is not treated or evaluated can lead to a heart attack (myocardial infarction) or death. °Blood tests, electrocardiograms, and X-rays may have been done to help determine a possible cause of your chest pain. After evaluation and observation, your health care provider has determined that it is unlikely your pain was caused by an unstable condition that requires hospitalization. However, a full evaluation of your pain may need to be completed, with additional diagnostic testing as directed. It is very important to keep your follow-up appointments. Not keeping your follow-up appointments could result in permanent heart damage, disability, or death. If there is any problem keeping your follow-up appointments, you must call your health care provider. °HOME CARE INSTRUCTIONS  °Due to the slight chance that your pain could be angina, it is important to follow your health care provider's treatment plan and also maintain a healthy lifestyle: °· Maintain or work toward achieving a healthy weight. °· Stay physically active and exercise regularly. °· Decrease your salt intake. °· Eat a balanced, healthy diet. Talk to a dietitian to learn about heart-healthy foods. °· Increase your fiber intake by including whole grains, vegetables, fruits, and nuts in your diet. °· Avoid situations that cause stress, anger, or depression. °· Take medicines as advised by your health care provider. Report any side effects to your health care provider. Do not stop medicines or adjust the dosages on your own. °· Quit smoking. Do not use nicotine patches or gum until you check with your health care provider. °· Keep your blood pressure, blood sugar, and cholesterol levels within normal limits. °· Limit alcohol intake to no more than  1 drink per day for women who are not pregnant and 2 drinks per day for men. °· Do not abuse drugs. °SEEK IMMEDIATE MEDICAL CARE IF: °You have severe chest pain or pressure which may include symptoms such as: °· You feel pain or pressure in your arms, neck, jaw, or back. °· You have severe back or abdominal pain, feel sick to your stomach (nauseous), or throw up (vomit). °· You are sweating profusely. °· You are having a fast or irregular heartbeat. °· You feel short of breath while at rest. °· You notice increasing shortness of breath during rest, sleep, or with activity. °· You have chest pain that does not get better after rest or after taking your usual medicine. °· You wake from sleep with chest pain. °· You are unable to sleep because you cannot breathe. °· You develop a frequent cough or you are coughing up blood. °· You feel dizzy, faint, or experience extreme fatigue. °· You develop severe weakness, dizziness, fainting, or chills. °Any of these symptoms may represent a serious problem that is an emergency. Do not wait to see if the symptoms will go away. Call your local emergency services (911 in the [place] Do not drive yourself to the hospital. °MAKE SURE YOU: °· Understand these instructions. °· Will watch your condition. °· Will get help right away if you are not doing well or get worse. °  °This information is not intended to replace advice given to you by your health care provider. Make sure you discuss any questions you have with your health care provider. °  °Document Released: 12/10/2010 Document Revised: 11/12/2013 Document Reviewed: 05/09/2013 °Elsevier Interactive Patient   Education 2016 [place] Pain Joint pain, which is also called arthralgia, can be caused by many things. Joint pain often goes away when you follow your health care provider's instructions for relieving pain at home. However, joint pain can also be caused by conditions that require further treatment. Common causes  of joint pain include:  Bruising in the area of the joint.  Overuse of the joint.  Wear and tear on the joints that occur with aging (osteoarthritis).  Various other forms of arthritis.  A buildup of a crystal form of uric acid in the joint (gout).  Infections of the joint (septic arthritis) or of the bone (osteomyelitis). Your health care provider may recommend medicine to help with the pain. If your joint pain continues, additional tests may be needed to diagnose your condition. HOME CARE INSTRUCTIONS Watch your condition for any changes. Follow these instructions as directed to lessen the pain that you are feeling.  Take medicines only as directed by your health care provider.  Rest the affected area for as long as your health care provider says that you should. If directed to do so, raise the painful joint above the level of your heart while you are sitting or lying down.  Do not do things that cause or worsen pain.  If directed, apply ice to the painful area:  Put ice in a plastic bag.  Place a towel between your skin and the bag.  Leave the ice on for 20 minutes, 2-3 times per day.  Wear an elastic bandage, splint, or sling as directed by your health care provider. Loosen the elastic bandage or splint if your fingers or toes become numb and tingle, or if they turn cold and blue.  Begin exercising or stretching the affected area as directed by your health care provider. Ask your health care provider what types of exercise are safe for you.  Keep all follow-up visits as directed by your health care provider. This is important. SEEK MEDICAL CARE IF:  Your pain increases, and medicine does not help.  Your joint pain does not improve within 3 days.  You have increased bruising or swelling.  You have a fever.  You lose 10 lb (4.5 kg) or more without trying. SEEK IMMEDIATE MEDICAL CARE IF:  You are not able to move the joint.  Your fingers or toes become numb or they  turn cold and blue.   This information is not intended to replace advice given to you by your health care provider. Make sure you discuss any questions you have with your health care provider.   Document Released: 11/07/2005 Document Revised: 11/28/2014 Document Reviewed: 08/19/2014 Elsevier Interactive Patient Education 2016 Elsevier Inc[employer]  Knee Pain Knee pain is a very common symptom and can have many causes. Knee pain often goes away when you follow your health care provider's instructions for relieving pain and discomfort at home. However, knee pain can develop into a condition that needs treatment. Some conditions may include:  Arthritis caused by wear and tear (osteoarthritis).  Arthritis caused by swelling and irritation (rheumatoid arthritis or gout).  A cyst or growth in your knee.  An infection in your knee joint.  An injury that will not heal.  Damage, swelling, or irritation of the tissues that support your knee (torn ligaments or tendinitis). If your knee pain continues, additional tests may be ordered to diagnose your condition. Tests may include X-rays or other imaging studies of your knee. You may also need  to have fluid removed from your knee. Treatment for ongoing knee pain depends on the cause, but treatment may include:  Medicines to relieve pain or swelling.  Steroid injections in your knee.  Physical therapy.  Surgery. HOME CARE INSTRUCTIONS  Take medicines only as directed by your health care provider.  Rest your knee and keep it raised (elevated) while you are resting.  Do not do things that cause or worsen pain.  Avoid high-impact activities or exercises, such as running, jumping rope, or doing jumping jacks.  Apply ice to the knee area:  Put ice in a plastic bag.  Place a towel between your skin and the bag.  Leave the ice on for 20 minutes, 2-3 times a day.  Ask your health care provider if you should wear an elastic knee support.  Keep a  pillow under your knee when you sleep.  Lose weight if you are overweight. Extra weight can put pressure on your knee.  Do not use any tobacco products, including cigarettes, chewing tobacco, or electronic cigarettes. If you need help quitting, ask your health care provider. Smoking may slow the healing of any bone and joint problems that you may have. SEEK MEDICAL CARE IF:  Your knee pain continues, changes, or gets worse.  You have a fever along with knee pain.  Your knee buckles or locks up.  Your knee becomes more swollen. SEEK IMMEDIATE MEDICAL CARE IF:   Your knee joint feels hot to the touch.  You have chest pain or trouble breathing.   This information is not intended to replace advice given to you by your health care provider. Make sure you discuss any questions you have with your health care provider.  Follow up with your cardiologist for re-evaluation of chest pain. Follow up with your PCP as well. You have been set up for a home health evaluation as well as a face-to-face. Return to the emergency department if you experience worsening of your symptoms, chest pain, shortness of breath, additional fall or injury, loss of consciousness, dizziness.

## 2015-10-12 NOTE — ED Notes (Signed)
PT returned from scans. Monitored by pulse ox, bp cuff, and 5-lead.

## 2015-10-12 NOTE — Care Management (Signed)
Jorge Sherman, Jorge Sherman (CSN: [id number]) (74 y.o. M) (Adm: 10/12/15)    [id number]      PCP    Merrilee Seashore    Demographics  Comment      Last edited by  on at    Address: Home Phone: Work Phone: Mobile Phone:   [place]  [place] [address]   [phone] -- [phone]   SSN: Insurance: Marital Status: Religion:   SSN-[id number] MEDICARE Legally Separated None    Patient Ethnicity & Race    Ethnic Group Patient Race   Not Hispanic or Latino White or Caucasian    Documents Filed to Patient    Power of Attorney Living Will Code Status MyChart Status   Not on File Not on File Not on file Pending    Admission Information    Attending Provider Admitting Provider Admission Type Admission Date/Time     Emergency 10/12/15 1046   Discharge Date Hospital Service Auth/Cert Status Service Area   10/12/15 Emergency Medicine Incomplete [place]   Unit Room/Bed Admission Status   [facility] DEPT E39C/E39C Discharged (Confirmed)         Hospital Account    Name Acct ID Class Status Primary Coverage   Jorge Sherman, Jorge Sherman [id number] Emergency Open MEDICARE - MEDICARE PART A AND B        Guarantor Account (for Hospital Account [id number])    Name Relation to Pt Service Area Active? Acct Type   Aileen Pilot Self CHSA Yes Personal/Family   Address Phone       [address] [place], [place] [address] [phone])          Coverage Information (for Hospital Account [id number])    1. [place] PART A AND B    F/O Payor/Plan Precert #   MEDICARE/MEDICARE PART A AND B    Subscriber Subscriber #   Stanley, [name]   Address Phone   [address], [place] [address]        2. BLUE CROSS BLUE SHIELD/BCBS/FEDERAL EMP PPO    F/O Payor/Plan Precert #   BLUE CROSS BLUE SHIELD/BCBS/FEDERAL EMP PPO    Subscriber Subscriber #   [name], [name]    Address Phone   [address], [place] [address] [phone]

## 2015-10-13 NOTE — ED Provider Notes (Signed)
CSN: [id number]     Arrival date & time 10/12/15  1046 History   First MD Initiated Contact with Patient 10/12/15 1109     Chief Complaint  Patient presents with  . Fall  . Chest Pain     (Consider location/radiation/quality/duration/timing/severity/associated sxs/prior Treatment) HPI   Jorge Sherman is a 74 y.o M with a pmhx of DM, CAD, chronic pain, who presents to the ED to be evaluated after a fall. Pt states that 5 days ago he fell in the shower and landed on both of his knees. Pt was unable to ambulate due to pain after the fall. Pt was seen in ED for this but did not want to wait for xrays so he left AMA. Today pt states that he was trying to rise out of his chair, however pt felt sharp pain in both of his knees and was unable to stand. Pt slid out of the chair landing on his bottom. Denies head injury or LOC. Pt states that as he was falling he felt a brief, <15second sharp pain in his substernal chest. Pain did not radiate. No associated SOB or paresthesias. Pt has had chest pain like this in the past for which he takes NTG for. Currently chest pain free. Pt call EMS b/c he could not get off of the floor. PT received 324 ASA in route to ED. Pt was on the ground for <10 min prior to EMS arrival.    Past Medical History  Diagnosis Date  . Dizziness and giddiness   . Pain in joint, lower leg   . Unspecified essential hypertension   . Leukocytosis, unspecified   . Type II or unspecified type diabetes mellitus without mention of complication, not stated as uncontrolled   . Obstructive sleep apnea (adult) (pediatric)   . Depressive disorder, not elsewhere classified   . Essential and other specified forms of tremor   . Unspecified cardiovascular disease   . Headache(784.0)   . Spinal stenosis, lumbar region, without neurogenic claudication   . Thoracic or lumbosacral neuritis or radiculitis, unspecified   . Chronic pain    Past Surgical History  Procedure Laterality Date  . Back  surgey      times 3  . Back surgery     Family History  Problem Relation Age of Onset  . Cancer Father   . Heart disease Father   . Stroke Mother   . Diabetes Mother    Social History  Substance Use Topics  . Smoking status: Unknown If Ever Smoked  . Smokeless tobacco: None  . Alcohol Use: No    Review of Systems  Cardiovascular: Negative for palpitations.  Musculoskeletal: Positive for arthralgias.  Skin: Negative for color change, pallor and wound.  Neurological: Negative for dizziness, syncope, light-headedness and numbness.      Allergies  Iohexol and Iodine  Home Medications   Prior to Admission medications   Medication Sig Start Date End Date Taking? Authorizing Provider  aspirin 325 MG tablet Take 325 mg by mouth daily.   Yes Historical Provider, MD  celecoxib (CELEBREX) 200 MG capsule Take 200 mg by mouth 2 (two) times daily.   Yes Historical Provider, MD  diazepam (VALIUM) 10 MG tablet Take 10 mg by mouth every 12 (twelve) hours as needed for anxiety or sleep.    Yes Historical Provider, MD  DULoxetine (CYMBALTA) 30 MG capsule Take 90 mg by mouth daily.   Yes Historical Provider, MD  ezetimibe (ZETIA) 10 MG tablet Take  10 mg by mouth daily.   Yes Historical Provider, MD  furosemide (LASIX) 20 MG tablet Take 20 mg by mouth daily.   Yes Historical Provider, MD  gabapentin (NEURONTIN) 800 MG tablet Take 800 mg by mouth 4 (four) times daily.   Yes Historical Provider, MD  hydroxychloroquine (PLAQUENIL) 200 MG tablet Take 200 mg by mouth daily. 02/06/15  Yes Historical Provider, MD  lisinopril (PRINIVIL,ZESTRIL) 40 MG tablet Take 40 mg by mouth daily.   Yes Historical Provider, MD  LORazepam (ATIVAN) 1 MG tablet Take 2 mg by mouth at bedtime as needed for anxiety.   Yes Historical Provider, MD  methocarbamol (ROBAXIN) 500 MG tablet Take 500 mg by mouth every 8 (eight) hours as needed for muscle spasms.    Yes Historical Provider, MD  multivitamin (METANX) 3-35-2 MG  TABS tablet Take 1 tablet by mouth 2 (two) times daily.   Yes Historical Provider, MD  oxyCODONE (OXYCONTIN) 80 MG 12 hr tablet Take 80 mg by mouth every 12 (twelve) hours.   Yes Historical Provider, MD  rosuvastatin (CRESTOR) 20 MG tablet Take 20 mg by mouth daily.   Yes Historical Provider, MD  senna (SENOKOT) 8.6 MG TABS Take 1 tablet by mouth 4 (four) times daily.   Yes Historical Provider, MD  lidocaine (LIDODERM) 5 % Place 3 patches onto the skin daily. Remove & Discard patch within 12 hours or as directed by MD    Historical Provider, MD  OxyCODONE (OXYCONTIN) 80 mg T12A 12 hr tablet Take 80 mg by mouth every 12 (twelve) hours.    Historical Provider, MD  oxyCODONE (ROXICODONE) 15 MG immediate release tablet Take 15 mg by mouth every 6 (six) hours as needed for pain.    Historical Provider, MD  OXYCODONE HCL PO Take 15 mg by mouth. Two tabs twice daily    Historical Provider, MD  oxyCODONE-acetaminophen (PERCOCET/ROXICET) 5-325 MG per tablet Take 1 tablet by mouth every 6 (six) hours as needed for severe pain. Patient not taking: Reported on 10/12/2015 12/04/13   Teressa Lower, MD   BP 137/93 mmHg  Pulse 80  Temp(Src) 97.7 F (36.5 C) (Oral)  Resp 20  SpO2 94% Physical Exam  Constitutional: He is oriented to person, place, and time. He appears well-developed and well-nourished. No distress.  HENT:  Head: Normocephalic and atraumatic.  Mouth/Throat: No oropharyngeal exudate.  Eyes: Conjunctivae and EOM are normal. Pupils are equal, round, and reactive to light. Right eye exhibits no discharge. Left eye exhibits no discharge. No scleral icterus.  Cardiovascular: Normal rate, regular rhythm, normal heart sounds and intact distal pulses.  Exam reveals no gallop and no friction rub.   No murmur heard. Pulmonary/Chest: Effort normal and breath sounds normal. No respiratory distress. He has no wheezes. He has no rales. He exhibits no tenderness.  Abdominal: Soft. He exhibits no distension.  There is no tenderness. There is no guarding.  Musculoskeletal: Normal range of motion. He exhibits no edema.  Negative anterior/poster drawer bilaterally. Negative ballottement test. No varus or valgus laxity. No crepitus. No pain with flexion or extension. No TTP of knees or ankles.    Neurological: He is alert and oriented to person, place, and time.  Skin: Skin is warm and dry. No rash noted. He is not diaphoretic. No erythema. No pallor.  Psychiatric: His behavior is normal.  Strange affect.  Nursing note and vitals reviewed.   ED Course  Procedures (including critical care time) Labs Review Labs Reviewed  BASIC METABOLIC  PANEL - Abnormal; Notable for the following:    Sodium 133 (*)    Chloride 95 (*)    Glucose, Bld 145 (*)    All other components within normal limits  CBC - Abnormal; Notable for the following:    RBC 4.05 (*)    Hemoglobin 11.2 (*)    HCT 34.8 (*)    All other components within normal limits  I-STAT TROPOININ, ED  Randolm Idol, ED    Imaging Review Dg Chest 2 View  10/12/2015  CLINICAL DATA:  Fall 2 days ago. EXAM: CHEST  2 VIEW COMPARISON:  None. FINDINGS: Study is AP lordotic in positioning was accentuates heart size and basilar markings. No confluent airspace opacities or effusions. No acute bony abnormality. Spinal stimulator device noted in the mid thoracic spine. IMPRESSION: No active cardiopulmonary disease. Electronically Signed   By: Rolm Baptise M.D.   On: 10/12/2015 12:19   Dg Knee Complete 4 Views Left  10/12/2015  CLINICAL DATA:  74 year old male with history of trauma from a fall yesterday evening with bruising on the knees. EXAM: LEFT KNEE - COMPLETE 4+ VIEW COMPARISON:  No priors. FINDINGS: Four views of the left knee demonstrate no acute displaced fracture, subluxation or dislocation. There is extensive joint space narrowing, subchondral sub sclerosis, subchondral cyst formation and osteophyte formation, most severe in the medial and  patellofemoral compartments, compatible with advanced osteoarthritis. Numerous vascular calcifications. IMPRESSION: 1. No acute radiographic abnormality of the left knee. 2. Tricompartmental osteoarthritis, most severe in the medial and patellofemoral compartments. Electronically Signed   By: Vinnie Langton M.D.   On: 10/12/2015 14:00   Dg Knee Complete 4 Views Right  10/12/2015  CLINICAL DATA:  Fall last night EXAM: RIGHT KNEE - COMPLETE 4+ VIEW COMPARISON:  None. FINDINGS: Severe tricompartment osteoarthritic change. No acute fracture. No dislocation. IMPRESSION: No acute bony pathology. Electronically Signed   By: Marybelle Killings M.D.   On: 10/12/2015 14:00   I have personally reviewed and evaluated these images and lab results as part of my medical decision-making.   EKG Interpretation   Date/Time:  Monday October 12 2015 10:54:10 EST Ventricular Rate:  77 PR Interval:  207 QRS Duration: 96 QT Interval:  406 QTC Calculation: 459 R Axis:   -12 Text Interpretation:  Sinus rhythm Low voltage, precordial leads No  significant change since last tracing Confirmed by NGUYEN, EMILY ([id number])  on 10/12/2015 10:56:04 AM      MDM   Final diagnoses:  Arthralgia of both knees  Chest pain, unspecified chest pain type  Fall, sequela   74 y.o M with hx of CAD and recurrent falls presents with bilateral knee pain from fall and a brief episode of CP which occurred during the fall. CP is typical for what pt has had in the past.   xrays of bilateral knees negative for acute fx. Will apply ace wraps to increase stability. Feel that pt is having recurrent falls at home due to polypharmacy. Pt takes 80mg  oxycodone 2x daily in addition to benzos, multiple psych meds. Case management was consulted and provided pt with appointment for home health care including PT, OT and RN. Encourage pt to talk to PCP about reducing amount of medication pt takes at home.   Patient is to be discharged with recommendation  to follow up with PCP in regards to today's hospital visit. Chest pain is not likely of cardiac or pulmonary etiology d/t presentation, low risk wells criteria, VSS, no tracheal deviation, no  JVD or new murmur, RRR, breath sounds equal bilaterally, EKG without acute abnormalities, negative troponin, and negative CXR. Pt has been advised to return to the ED is CP becomes exertional, associated with diaphoresis or nausea, radiates to left jaw/arm, worsens or becomes concerning in any way. Pt appears reliable for follow up and is agreeable to discharge.   Case has been discussed with and seen by Dr. Alfonse Spruce who agrees with the above plan to discharge.       Dondra Spry [place], PA-C 10/14/15 2004  Harvel Quale, MD 10/15/15 [phone]

## 2015-11-09 ENCOUNTER — Telehealth: Payer: Self-pay | Admitting: Interventional Cardiology

## 2015-11-09 NOTE — Telephone Encounter (Signed)
New Message  Pt request a call back to discuss if the knee replacement surgery is good idea. He says that he has already discussed this with Dr. Tamala Julian But the pt would like to go into detail in discussing why it is not a good idea at this time.

## 2015-11-09 NOTE — Telephone Encounter (Signed)
Returned pt call. lmtcb if assistance is still needed 

## 2016-01-14 ENCOUNTER — Telehealth: Payer: Self-pay | Admitting: Interventional Cardiology

## 2016-01-14 NOTE — Telephone Encounter (Signed)
Pt is thinking about having a knee replacement. He saw Dr Tamala Julian in April of 2016. He wants to know if Dr Tamala Julian needs to see him before this surgery?

## 2016-01-14 NOTE — Telephone Encounter (Signed)
Returned pt call. Pt sts that he will be scheduling knee sx in the near future and will need cardiac clearance. Pt is due for his yearly f/u with Dr.Smith. appt scheduled for 3/16 @ 10:15  Pt voiced appreciation for the call and verbalized understanding.

## 2016-02-04 ENCOUNTER — Ambulatory Visit: Payer: Medicare Other | Admitting: Interventional Cardiology

## 2016-03-08 DIAGNOSIS — I10 Essential (primary) hypertension: Secondary | ICD-10-CM | POA: Diagnosis not present

## 2016-03-08 DIAGNOSIS — E782 Mixed hyperlipidemia: Secondary | ICD-10-CM | POA: Diagnosis not present

## 2016-03-08 DIAGNOSIS — E1165 Type 2 diabetes mellitus with hyperglycemia: Secondary | ICD-10-CM | POA: Diagnosis not present

## 2016-03-08 DIAGNOSIS — I2510 Atherosclerotic heart disease of native coronary artery without angina pectoris: Secondary | ICD-10-CM | POA: Diagnosis not present

## 2016-03-09 DIAGNOSIS — M2550 Pain in unspecified joint: Secondary | ICD-10-CM | POA: Diagnosis not present

## 2016-03-09 DIAGNOSIS — E1165 Type 2 diabetes mellitus with hyperglycemia: Secondary | ICD-10-CM | POA: Diagnosis not present

## 2016-03-09 DIAGNOSIS — I10 Essential (primary) hypertension: Secondary | ICD-10-CM | POA: Diagnosis not present

## 2016-03-09 DIAGNOSIS — F322 Major depressive disorder, single episode, severe without psychotic features: Secondary | ICD-10-CM | POA: Diagnosis not present

## 2016-04-28 DIAGNOSIS — F322 Major depressive disorder, single episode, severe without psychotic features: Secondary | ICD-10-CM | POA: Diagnosis not present

## 2016-04-28 DIAGNOSIS — E1165 Type 2 diabetes mellitus with hyperglycemia: Secondary | ICD-10-CM | POA: Diagnosis not present

## 2016-04-28 DIAGNOSIS — E782 Mixed hyperlipidemia: Secondary | ICD-10-CM | POA: Diagnosis not present

## 2016-04-28 DIAGNOSIS — I10 Essential (primary) hypertension: Secondary | ICD-10-CM | POA: Diagnosis not present

## 2016-05-05 DIAGNOSIS — R030 Elevated blood-pressure reading, without diagnosis of hypertension: Secondary | ICD-10-CM | POA: Diagnosis not present

## 2016-05-05 DIAGNOSIS — R079 Chest pain, unspecified: Secondary | ICD-10-CM | POA: Diagnosis not present

## 2016-05-05 DIAGNOSIS — R110 Nausea: Secondary | ICD-10-CM | POA: Diagnosis not present

## 2016-05-05 DIAGNOSIS — I10 Essential (primary) hypertension: Secondary | ICD-10-CM | POA: Diagnosis not present

## 2016-05-05 DIAGNOSIS — I2510 Atherosclerotic heart disease of native coronary artery without angina pectoris: Secondary | ICD-10-CM | POA: Diagnosis not present

## 2016-05-06 ENCOUNTER — Telehealth: Payer: Self-pay | Admitting: Interventional Cardiology

## 2016-05-09 ENCOUNTER — Telehealth: Payer: Self-pay | Admitting: Interventional Cardiology

## 2016-05-09 NOTE — Telephone Encounter (Signed)
Cleared for dental work. No meds needed.

## 2016-05-09 NOTE — Telephone Encounter (Signed)
Dr Alphonsus Sias is a surgical clearance  form from Dr Belenda Cruise at [facility] under your Review Reports folder in your Start. There are specific questions Dr Belenda Cruise is asking about dental extractions.  Would you review that form and make comments? Once you have reviewed it I will let the pt know your recommendations. Thanks.

## 2016-05-09 NOTE — Telephone Encounter (Signed)
Pt states last Wednesday he was having a dental procedure and his BP spiked during procedure, 200/105. Pt states dental procedure was not completed and he was advised to report to ED for further evaluation.  Pt states he was evaluated in ED in [place] and heart "checked out".  Pt states he was told by ED that spike in BP was probably due to epinephrine injection given to him during dental procedure.  Pt states BP at home -6/16 145/90 6/17 130/75 6/18 113/65 6/19 125/76.

## 2016-05-09 NOTE — Telephone Encounter (Signed)
Pt calling c/o BP high -seen in ER in [place]-had dental procedure and the ER felt that the Epinephrine hit a blood vessel and that caused the high BP-needs ok from Dr. Tamala Julian to finish his dental  procedure by Dr. [facility] office [phone] fax [phone] -fax should have been faxed over to request consent

## 2016-05-09 NOTE — Telephone Encounter (Signed)
There is a request from Dr Elmon Else for Dr Tamala Julian to clear pt to have dental extractions.  I have placed this request on Dr Thompson Caul cart for him to review when he returns to the office 05/11/16.  Pt is aware the request from Dr Belenda Cruise is here for Dr Tamala Julian to review 05/11/16.

## 2016-05-10 DIAGNOSIS — I10 Essential (primary) hypertension: Secondary | ICD-10-CM | POA: Diagnosis not present

## 2016-05-17 NOTE — Telephone Encounter (Signed)
Clearance placed in Mr nurse fax box to be faxed to [facility] attn: Dr.Gregory

## 2016-07-22 ENCOUNTER — Other Ambulatory Visit: Payer: Self-pay

## 2016-08-10 DIAGNOSIS — M25561 Pain in right knee: Secondary | ICD-10-CM | POA: Diagnosis not present

## 2016-09-07 DIAGNOSIS — S0592XA Unspecified injury of left eye and orbit, initial encounter: Secondary | ICD-10-CM | POA: Diagnosis not present

## 2016-11-22 DIAGNOSIS — Z87891 Personal history of nicotine dependence: Secondary | ICD-10-CM | POA: Diagnosis not present

## 2016-11-22 DIAGNOSIS — R0602 Shortness of breath: Secondary | ICD-10-CM | POA: Diagnosis not present

## 2016-11-22 DIAGNOSIS — R0600 Dyspnea, unspecified: Secondary | ICD-10-CM | POA: Diagnosis not present

## 2016-11-22 DIAGNOSIS — M549 Dorsalgia, unspecified: Secondary | ICD-10-CM | POA: Diagnosis present

## 2016-11-22 DIAGNOSIS — E119 Type 2 diabetes mellitus without complications: Secondary | ICD-10-CM | POA: Diagnosis not present

## 2016-11-22 DIAGNOSIS — K219 Gastro-esophageal reflux disease without esophagitis: Secondary | ICD-10-CM | POA: Diagnosis present

## 2016-11-22 DIAGNOSIS — J9601 Acute respiratory failure with hypoxia: Secondary | ICD-10-CM | POA: Diagnosis not present

## 2016-11-22 DIAGNOSIS — G8929 Other chronic pain: Secondary | ICD-10-CM | POA: Diagnosis not present

## 2016-11-22 DIAGNOSIS — I517 Cardiomegaly: Secondary | ICD-10-CM | POA: Diagnosis not present

## 2016-11-22 DIAGNOSIS — I359 Nonrheumatic aortic valve disorder, unspecified: Secondary | ICD-10-CM | POA: Diagnosis not present

## 2016-11-22 DIAGNOSIS — E871 Hypo-osmolality and hyponatremia: Secondary | ICD-10-CM | POA: Diagnosis not present

## 2016-11-22 DIAGNOSIS — E785 Hyperlipidemia, unspecified: Secondary | ICD-10-CM | POA: Diagnosis present

## 2016-11-22 DIAGNOSIS — I2510 Atherosclerotic heart disease of native coronary artery without angina pectoris: Secondary | ICD-10-CM | POA: Diagnosis not present

## 2016-11-22 DIAGNOSIS — D72829 Elevated white blood cell count, unspecified: Secondary | ICD-10-CM | POA: Diagnosis not present

## 2016-11-22 DIAGNOSIS — J189 Pneumonia, unspecified organism: Secondary | ICD-10-CM | POA: Diagnosis not present

## 2016-11-22 DIAGNOSIS — R600 Localized edema: Secondary | ICD-10-CM | POA: Diagnosis not present

## 2016-11-22 DIAGNOSIS — R069 Unspecified abnormalities of breathing: Secondary | ICD-10-CM | POA: Diagnosis not present

## 2016-11-22 DIAGNOSIS — Z7984 Long term (current) use of oral hypoglycemic drugs: Secondary | ICD-10-CM | POA: Diagnosis not present

## 2016-11-22 DIAGNOSIS — I10 Essential (primary) hypertension: Secondary | ICD-10-CM | POA: Diagnosis not present

## 2016-12-31 DIAGNOSIS — R1084 Generalized abdominal pain: Secondary | ICD-10-CM | POA: Diagnosis not present

## 2016-12-31 DIAGNOSIS — R918 Other nonspecific abnormal finding of lung field: Secondary | ICD-10-CM | POA: Diagnosis not present

## 2016-12-31 DIAGNOSIS — J810 Acute pulmonary edema: Secondary | ICD-10-CM | POA: Diagnosis not present

## 2016-12-31 DIAGNOSIS — R509 Fever, unspecified: Secondary | ICD-10-CM | POA: Diagnosis not present

## 2016-12-31 DIAGNOSIS — R079 Chest pain, unspecified: Secondary | ICD-10-CM | POA: Diagnosis not present

## 2016-12-31 DIAGNOSIS — I517 Cardiomegaly: Secondary | ICD-10-CM | POA: Diagnosis not present

## 2016-12-31 DIAGNOSIS — J9601 Acute respiratory failure with hypoxia: Secondary | ICD-10-CM | POA: Diagnosis not present

## 2016-12-31 DIAGNOSIS — R0989 Other specified symptoms and signs involving the circulatory and respiratory systems: Secondary | ICD-10-CM | POA: Diagnosis not present

## 2017-01-01 DIAGNOSIS — J9602 Acute respiratory failure with hypercapnia: Secondary | ICD-10-CM | POA: Diagnosis not present

## 2017-01-01 DIAGNOSIS — K429 Umbilical hernia without obstruction or gangrene: Secondary | ICD-10-CM | POA: Diagnosis not present

## 2017-01-01 DIAGNOSIS — R079 Chest pain, unspecified: Secondary | ICD-10-CM | POA: Diagnosis not present

## 2017-01-01 DIAGNOSIS — A419 Sepsis, unspecified organism: Secondary | ICD-10-CM | POA: Diagnosis not present

## 2017-01-01 DIAGNOSIS — R410 Disorientation, unspecified: Secondary | ICD-10-CM | POA: Diagnosis not present

## 2017-01-01 DIAGNOSIS — R0602 Shortness of breath: Secondary | ICD-10-CM | POA: Diagnosis not present

## 2017-01-01 DIAGNOSIS — J189 Pneumonia, unspecified organism: Secondary | ICD-10-CM | POA: Diagnosis not present

## 2017-01-01 DIAGNOSIS — G894 Chronic pain syndrome: Secondary | ICD-10-CM | POA: Diagnosis not present

## 2017-01-01 DIAGNOSIS — J9601 Acute respiratory failure with hypoxia: Secondary | ICD-10-CM | POA: Diagnosis not present

## 2017-01-01 DIAGNOSIS — R918 Other nonspecific abnormal finding of lung field: Secondary | ICD-10-CM | POA: Diagnosis not present

## 2017-01-01 DIAGNOSIS — R1110 Vomiting, unspecified: Secondary | ICD-10-CM | POA: Diagnosis not present

## 2017-01-01 DIAGNOSIS — R1084 Generalized abdominal pain: Secondary | ICD-10-CM | POA: Diagnosis not present

## 2017-01-02 DIAGNOSIS — G894 Chronic pain syndrome: Secondary | ICD-10-CM | POA: Diagnosis not present

## 2017-01-02 DIAGNOSIS — J9601 Acute respiratory failure with hypoxia: Secondary | ICD-10-CM | POA: Diagnosis not present

## 2017-01-02 DIAGNOSIS — R4182 Altered mental status, unspecified: Secondary | ICD-10-CM | POA: Diagnosis not present

## 2017-01-02 DIAGNOSIS — K429 Umbilical hernia without obstruction or gangrene: Secondary | ICD-10-CM | POA: Diagnosis not present

## 2017-01-02 DIAGNOSIS — J189 Pneumonia, unspecified organism: Secondary | ICD-10-CM | POA: Diagnosis not present

## 2017-01-02 DIAGNOSIS — R1110 Vomiting, unspecified: Secondary | ICD-10-CM | POA: Diagnosis not present

## 2017-01-02 DIAGNOSIS — A419 Sepsis, unspecified organism: Secondary | ICD-10-CM | POA: Diagnosis not present

## 2017-01-02 DIAGNOSIS — J9602 Acute respiratory failure with hypercapnia: Secondary | ICD-10-CM | POA: Diagnosis not present

## 2017-01-02 DIAGNOSIS — R1084 Generalized abdominal pain: Secondary | ICD-10-CM | POA: Diagnosis not present

## 2017-01-03 DIAGNOSIS — R4182 Altered mental status, unspecified: Secondary | ICD-10-CM | POA: Diagnosis not present

## 2017-01-03 DIAGNOSIS — R509 Fever, unspecified: Secondary | ICD-10-CM | POA: Diagnosis not present

## 2017-01-03 DIAGNOSIS — G894 Chronic pain syndrome: Secondary | ICD-10-CM | POA: Diagnosis not present

## 2017-01-03 DIAGNOSIS — R1110 Vomiting, unspecified: Secondary | ICD-10-CM | POA: Diagnosis not present

## 2017-01-03 DIAGNOSIS — J189 Pneumonia, unspecified organism: Secondary | ICD-10-CM | POA: Diagnosis not present

## 2017-01-03 DIAGNOSIS — K429 Umbilical hernia without obstruction or gangrene: Secondary | ICD-10-CM | POA: Diagnosis not present

## 2017-01-04 DIAGNOSIS — Z955 Presence of coronary angioplasty implant and graft: Secondary | ICD-10-CM | POA: Diagnosis not present

## 2017-01-04 DIAGNOSIS — J9601 Acute respiratory failure with hypoxia: Secondary | ICD-10-CM | POA: Diagnosis not present

## 2017-01-04 DIAGNOSIS — K429 Umbilical hernia without obstruction or gangrene: Secondary | ICD-10-CM | POA: Diagnosis not present

## 2017-01-04 DIAGNOSIS — J9602 Acute respiratory failure with hypercapnia: Secondary | ICD-10-CM | POA: Diagnosis not present

## 2017-01-04 DIAGNOSIS — R1110 Vomiting, unspecified: Secondary | ICD-10-CM | POA: Diagnosis not present

## 2017-01-04 DIAGNOSIS — I2510 Atherosclerotic heart disease of native coronary artery without angina pectoris: Secondary | ICD-10-CM | POA: Diagnosis not present

## 2017-01-04 DIAGNOSIS — G894 Chronic pain syndrome: Secondary | ICD-10-CM | POA: Diagnosis not present

## 2017-01-04 DIAGNOSIS — R4182 Altered mental status, unspecified: Secondary | ICD-10-CM | POA: Diagnosis not present

## 2017-01-17 DIAGNOSIS — I10 Essential (primary) hypertension: Secondary | ICD-10-CM | POA: Diagnosis not present

## 2017-01-17 DIAGNOSIS — E1165 Type 2 diabetes mellitus with hyperglycemia: Secondary | ICD-10-CM | POA: Diagnosis not present

## 2017-01-17 DIAGNOSIS — E782 Mixed hyperlipidemia: Secondary | ICD-10-CM | POA: Diagnosis not present

## 2017-01-17 DIAGNOSIS — M150 Primary generalized (osteo)arthritis: Secondary | ICD-10-CM | POA: Diagnosis not present

## 2017-01-31 DIAGNOSIS — Z5181 Encounter for therapeutic drug level monitoring: Secondary | ICD-10-CM | POA: Diagnosis not present

## 2017-02-03 DIAGNOSIS — K5903 Drug induced constipation: Secondary | ICD-10-CM | POA: Diagnosis not present

## 2017-02-03 DIAGNOSIS — R1084 Generalized abdominal pain: Secondary | ICD-10-CM | POA: Diagnosis not present

## 2017-02-03 DIAGNOSIS — K8020 Calculus of gallbladder without cholecystitis without obstruction: Secondary | ICD-10-CM | POA: Diagnosis not present

## 2017-02-03 DIAGNOSIS — Z7982 Long term (current) use of aspirin: Secondary | ICD-10-CM | POA: Diagnosis not present

## 2017-02-03 DIAGNOSIS — K219 Gastro-esophageal reflux disease without esophagitis: Secondary | ICD-10-CM | POA: Diagnosis not present

## 2017-02-03 DIAGNOSIS — R630 Anorexia: Secondary | ICD-10-CM | POA: Diagnosis not present

## 2017-02-03 DIAGNOSIS — D3502 Benign neoplasm of left adrenal gland: Secondary | ICD-10-CM | POA: Diagnosis not present

## 2017-02-03 DIAGNOSIS — E785 Hyperlipidemia, unspecified: Secondary | ICD-10-CM | POA: Diagnosis not present

## 2017-02-03 DIAGNOSIS — T402X5A Adverse effect of other opioids, initial encounter: Secondary | ICD-10-CM | POA: Diagnosis not present

## 2017-02-03 DIAGNOSIS — D1779 Benign lipomatous neoplasm of other sites: Secondary | ICD-10-CM | POA: Diagnosis not present

## 2017-02-03 DIAGNOSIS — Z79891 Long term (current) use of opiate analgesic: Secondary | ICD-10-CM | POA: Diagnosis not present

## 2017-02-03 DIAGNOSIS — Z79899 Other long term (current) drug therapy: Secondary | ICD-10-CM | POA: Diagnosis not present

## 2017-02-03 DIAGNOSIS — R197 Diarrhea, unspecified: Secondary | ICD-10-CM | POA: Diagnosis not present

## 2017-02-03 DIAGNOSIS — E119 Type 2 diabetes mellitus without complications: Secondary | ICD-10-CM | POA: Diagnosis not present

## 2017-02-03 DIAGNOSIS — Z87891 Personal history of nicotine dependence: Secondary | ICD-10-CM | POA: Diagnosis not present

## 2017-02-03 DIAGNOSIS — Z91041 Radiographic dye allergy status: Secondary | ICD-10-CM | POA: Diagnosis not present

## 2017-02-03 DIAGNOSIS — N281 Cyst of kidney, acquired: Secondary | ICD-10-CM | POA: Diagnosis not present

## 2017-02-03 DIAGNOSIS — Z7984 Long term (current) use of oral hypoglycemic drugs: Secondary | ICD-10-CM | POA: Diagnosis not present

## 2017-02-03 DIAGNOSIS — Z791 Long term (current) use of non-steroidal anti-inflammatories (NSAID): Secondary | ICD-10-CM | POA: Diagnosis not present

## 2017-02-03 DIAGNOSIS — N200 Calculus of kidney: Secondary | ICD-10-CM | POA: Diagnosis not present

## 2017-02-03 DIAGNOSIS — I10 Essential (primary) hypertension: Secondary | ICD-10-CM | POA: Diagnosis not present

## 2017-02-07 DIAGNOSIS — N390 Urinary tract infection, site not specified: Secondary | ICD-10-CM | POA: Diagnosis not present

## 2017-02-07 DIAGNOSIS — I10 Essential (primary) hypertension: Secondary | ICD-10-CM | POA: Diagnosis not present

## 2017-02-07 DIAGNOSIS — K5900 Constipation, unspecified: Secondary | ICD-10-CM | POA: Diagnosis not present

## 2017-02-07 DIAGNOSIS — Z86010 Personal history of colonic polyps: Secondary | ICD-10-CM | POA: Diagnosis not present

## 2017-02-16 DIAGNOSIS — R3911 Hesitancy of micturition: Secondary | ICD-10-CM | POA: Diagnosis not present

## 2017-02-16 DIAGNOSIS — N401 Enlarged prostate with lower urinary tract symptoms: Secondary | ICD-10-CM | POA: Diagnosis not present

## 2017-02-28 DIAGNOSIS — E669 Obesity, unspecified: Secondary | ICD-10-CM | POA: Diagnosis not present

## 2017-02-28 DIAGNOSIS — J181 Lobar pneumonia, unspecified organism: Secondary | ICD-10-CM | POA: Diagnosis not present

## 2017-02-28 DIAGNOSIS — Z9981 Dependence on supplemental oxygen: Secondary | ICD-10-CM | POA: Diagnosis not present

## 2017-02-28 DIAGNOSIS — Z7984 Long term (current) use of oral hypoglycemic drugs: Secondary | ICD-10-CM | POA: Diagnosis not present

## 2017-02-28 DIAGNOSIS — E119 Type 2 diabetes mellitus without complications: Secondary | ICD-10-CM | POA: Diagnosis not present

## 2017-02-28 DIAGNOSIS — M549 Dorsalgia, unspecified: Secondary | ICD-10-CM | POA: Diagnosis not present

## 2017-02-28 DIAGNOSIS — I10 Essential (primary) hypertension: Secondary | ICD-10-CM | POA: Diagnosis not present

## 2017-02-28 DIAGNOSIS — M19012 Primary osteoarthritis, left shoulder: Secondary | ICD-10-CM | POA: Diagnosis not present

## 2017-02-28 DIAGNOSIS — Z7982 Long term (current) use of aspirin: Secondary | ICD-10-CM | POA: Diagnosis not present

## 2017-02-28 DIAGNOSIS — K219 Gastro-esophageal reflux disease without esophagitis: Secondary | ICD-10-CM | POA: Diagnosis not present

## 2017-02-28 DIAGNOSIS — E871 Hypo-osmolality and hyponatremia: Secondary | ICD-10-CM | POA: Diagnosis not present

## 2017-02-28 DIAGNOSIS — I2510 Atherosclerotic heart disease of native coronary artery without angina pectoris: Secondary | ICD-10-CM | POA: Diagnosis not present

## 2017-02-28 DIAGNOSIS — G8929 Other chronic pain: Secondary | ICD-10-CM | POA: Diagnosis not present

## 2017-02-28 DIAGNOSIS — R05 Cough: Secondary | ICD-10-CM | POA: Diagnosis not present

## 2017-02-28 DIAGNOSIS — Z79899 Other long term (current) drug therapy: Secondary | ICD-10-CM | POA: Diagnosis not present

## 2017-02-28 DIAGNOSIS — K5909 Other constipation: Secondary | ICD-10-CM | POA: Diagnosis not present

## 2017-02-28 DIAGNOSIS — R110 Nausea: Secondary | ICD-10-CM | POA: Diagnosis not present

## 2017-02-28 DIAGNOSIS — J9601 Acute respiratory failure with hypoxia: Secondary | ICD-10-CM | POA: Diagnosis not present

## 2017-02-28 DIAGNOSIS — J189 Pneumonia, unspecified organism: Secondary | ICD-10-CM | POA: Diagnosis not present

## 2017-02-28 DIAGNOSIS — R531 Weakness: Secondary | ICD-10-CM | POA: Diagnosis not present

## 2017-02-28 DIAGNOSIS — D72829 Elevated white blood cell count, unspecified: Secondary | ICD-10-CM | POA: Diagnosis not present

## 2017-03-01 DIAGNOSIS — J9601 Acute respiratory failure with hypoxia: Secondary | ICD-10-CM | POA: Diagnosis not present

## 2017-03-10 DIAGNOSIS — R0689 Other abnormalities of breathing: Secondary | ICD-10-CM | POA: Diagnosis not present

## 2017-03-10 DIAGNOSIS — I10 Essential (primary) hypertension: Secondary | ICD-10-CM | POA: Diagnosis not present

## 2017-03-10 DIAGNOSIS — Z87891 Personal history of nicotine dependence: Secondary | ICD-10-CM | POA: Diagnosis not present

## 2017-03-10 DIAGNOSIS — J9811 Atelectasis: Secondary | ICD-10-CM | POA: Diagnosis not present

## 2017-03-10 DIAGNOSIS — I2510 Atherosclerotic heart disease of native coronary artery without angina pectoris: Secondary | ICD-10-CM | POA: Diagnosis not present

## 2017-03-10 DIAGNOSIS — J9621 Acute and chronic respiratory failure with hypoxia: Secondary | ICD-10-CM | POA: Diagnosis not present

## 2017-03-10 DIAGNOSIS — J440 Chronic obstructive pulmonary disease with acute lower respiratory infection: Secondary | ICD-10-CM | POA: Diagnosis not present

## 2017-03-10 DIAGNOSIS — R0602 Shortness of breath: Secondary | ICD-10-CM | POA: Diagnosis not present

## 2017-03-10 DIAGNOSIS — E119 Type 2 diabetes mellitus without complications: Secondary | ICD-10-CM | POA: Diagnosis not present

## 2017-03-10 DIAGNOSIS — J9601 Acute respiratory failure with hypoxia: Secondary | ICD-10-CM | POA: Diagnosis not present

## 2017-03-10 DIAGNOSIS — J984 Other disorders of lung: Secondary | ICD-10-CM | POA: Diagnosis not present

## 2017-03-10 DIAGNOSIS — J441 Chronic obstructive pulmonary disease with (acute) exacerbation: Secondary | ICD-10-CM | POA: Diagnosis not present

## 2017-03-10 DIAGNOSIS — J9622 Acute and chronic respiratory failure with hypercapnia: Secondary | ICD-10-CM | POA: Diagnosis not present

## 2017-03-10 DIAGNOSIS — R6511 Systemic inflammatory response syndrome (SIRS) of non-infectious origin with acute organ dysfunction: Secondary | ICD-10-CM | POA: Diagnosis not present

## 2017-03-10 DIAGNOSIS — R079 Chest pain, unspecified: Secondary | ICD-10-CM | POA: Diagnosis not present

## 2017-03-10 DIAGNOSIS — E871 Hypo-osmolality and hyponatremia: Secondary | ICD-10-CM | POA: Diagnosis not present

## 2017-03-11 DIAGNOSIS — G8929 Other chronic pain: Secondary | ICD-10-CM | POA: Diagnosis not present

## 2017-03-11 DIAGNOSIS — K219 Gastro-esophageal reflux disease without esophagitis: Secondary | ICD-10-CM | POA: Diagnosis not present

## 2017-03-11 DIAGNOSIS — Z7982 Long term (current) use of aspirin: Secondary | ICD-10-CM | POA: Diagnosis not present

## 2017-03-11 DIAGNOSIS — J9602 Acute respiratory failure with hypercapnia: Secondary | ICD-10-CM | POA: Diagnosis not present

## 2017-03-11 DIAGNOSIS — Z7951 Long term (current) use of inhaled steroids: Secondary | ICD-10-CM | POA: Diagnosis not present

## 2017-03-11 DIAGNOSIS — I10 Essential (primary) hypertension: Secondary | ICD-10-CM | POA: Diagnosis not present

## 2017-03-11 DIAGNOSIS — E785 Hyperlipidemia, unspecified: Secondary | ICD-10-CM | POA: Diagnosis not present

## 2017-03-11 DIAGNOSIS — I2510 Atherosclerotic heart disease of native coronary artery without angina pectoris: Secondary | ICD-10-CM | POA: Diagnosis not present

## 2017-03-11 DIAGNOSIS — J9622 Acute and chronic respiratory failure with hypercapnia: Secondary | ICD-10-CM | POA: Diagnosis not present

## 2017-03-11 DIAGNOSIS — B9781 Human metapneumovirus as the cause of diseases classified elsewhere: Secondary | ICD-10-CM | POA: Diagnosis present

## 2017-03-11 DIAGNOSIS — D473 Essential (hemorrhagic) thrombocythemia: Secondary | ICD-10-CM | POA: Diagnosis present

## 2017-03-11 DIAGNOSIS — J440 Chronic obstructive pulmonary disease with acute lower respiratory infection: Secondary | ICD-10-CM | POA: Diagnosis present

## 2017-03-11 DIAGNOSIS — J441 Chronic obstructive pulmonary disease with (acute) exacerbation: Secondary | ICD-10-CM | POA: Diagnosis not present

## 2017-03-11 DIAGNOSIS — J984 Other disorders of lung: Secondary | ICD-10-CM | POA: Diagnosis not present

## 2017-03-11 DIAGNOSIS — Z7984 Long term (current) use of oral hypoglycemic drugs: Secondary | ICD-10-CM | POA: Diagnosis not present

## 2017-03-11 DIAGNOSIS — J9601 Acute respiratory failure with hypoxia: Secondary | ICD-10-CM | POA: Diagnosis not present

## 2017-03-11 DIAGNOSIS — N400 Enlarged prostate without lower urinary tract symptoms: Secondary | ICD-10-CM | POA: Diagnosis not present

## 2017-03-11 DIAGNOSIS — E118 Type 2 diabetes mellitus with unspecified complications: Secondary | ICD-10-CM | POA: Diagnosis not present

## 2017-03-11 DIAGNOSIS — R0689 Other abnormalities of breathing: Secondary | ICD-10-CM | POA: Diagnosis not present

## 2017-03-11 DIAGNOSIS — D649 Anemia, unspecified: Secondary | ICD-10-CM | POA: Diagnosis present

## 2017-03-11 DIAGNOSIS — R0602 Shortness of breath: Secondary | ICD-10-CM | POA: Diagnosis not present

## 2017-03-11 DIAGNOSIS — Z87891 Personal history of nicotine dependence: Secondary | ICD-10-CM | POA: Diagnosis not present

## 2017-03-11 DIAGNOSIS — R6511 Systemic inflammatory response syndrome (SIRS) of non-infectious origin with acute organ dysfunction: Secondary | ICD-10-CM | POA: Diagnosis present

## 2017-03-11 DIAGNOSIS — G4733 Obstructive sleep apnea (adult) (pediatric): Secondary | ICD-10-CM | POA: Diagnosis present

## 2017-03-11 DIAGNOSIS — J9621 Acute and chronic respiratory failure with hypoxia: Secondary | ICD-10-CM | POA: Diagnosis present

## 2017-03-11 DIAGNOSIS — J9811 Atelectasis: Secondary | ICD-10-CM | POA: Diagnosis not present

## 2017-03-11 DIAGNOSIS — J208 Acute bronchitis due to other specified organisms: Secondary | ICD-10-CM | POA: Diagnosis present

## 2017-03-11 DIAGNOSIS — E871 Hypo-osmolality and hyponatremia: Secondary | ICD-10-CM | POA: Diagnosis present

## 2017-03-11 DIAGNOSIS — M549 Dorsalgia, unspecified: Secondary | ICD-10-CM | POA: Diagnosis not present

## 2017-03-20 DIAGNOSIS — D649 Anemia, unspecified: Secondary | ICD-10-CM | POA: Diagnosis not present

## 2017-03-20 DIAGNOSIS — E871 Hypo-osmolality and hyponatremia: Secondary | ICD-10-CM | POA: Diagnosis not present

## 2017-03-20 DIAGNOSIS — J441 Chronic obstructive pulmonary disease with (acute) exacerbation: Secondary | ICD-10-CM | POA: Diagnosis not present

## 2017-03-20 DIAGNOSIS — M549 Dorsalgia, unspecified: Secondary | ICD-10-CM | POA: Diagnosis not present

## 2017-03-20 DIAGNOSIS — K219 Gastro-esophageal reflux disease without esophagitis: Secondary | ICD-10-CM | POA: Diagnosis not present

## 2017-03-20 DIAGNOSIS — N400 Enlarged prostate without lower urinary tract symptoms: Secondary | ICD-10-CM | POA: Diagnosis not present

## 2017-03-20 DIAGNOSIS — I2510 Atherosclerotic heart disease of native coronary artery without angina pectoris: Secondary | ICD-10-CM | POA: Diagnosis not present

## 2017-03-20 DIAGNOSIS — E119 Type 2 diabetes mellitus without complications: Secondary | ICD-10-CM | POA: Diagnosis not present

## 2017-03-20 DIAGNOSIS — E785 Hyperlipidemia, unspecified: Secondary | ICD-10-CM | POA: Diagnosis not present

## 2017-03-20 DIAGNOSIS — I10 Essential (primary) hypertension: Secondary | ICD-10-CM | POA: Diagnosis not present

## 2017-03-25 DIAGNOSIS — I10 Essential (primary) hypertension: Secondary | ICD-10-CM | POA: Diagnosis not present

## 2017-03-25 DIAGNOSIS — I2510 Atherosclerotic heart disease of native coronary artery without angina pectoris: Secondary | ICD-10-CM | POA: Diagnosis not present

## 2017-03-25 DIAGNOSIS — E119 Type 2 diabetes mellitus without complications: Secondary | ICD-10-CM | POA: Diagnosis not present

## 2017-03-25 DIAGNOSIS — J441 Chronic obstructive pulmonary disease with (acute) exacerbation: Secondary | ICD-10-CM | POA: Diagnosis not present

## 2017-03-25 DIAGNOSIS — E871 Hypo-osmolality and hyponatremia: Secondary | ICD-10-CM | POA: Diagnosis not present

## 2017-03-25 DIAGNOSIS — D649 Anemia, unspecified: Secondary | ICD-10-CM | POA: Diagnosis not present

## 2017-03-27 DIAGNOSIS — D649 Anemia, unspecified: Secondary | ICD-10-CM | POA: Diagnosis not present

## 2017-03-27 DIAGNOSIS — E119 Type 2 diabetes mellitus without complications: Secondary | ICD-10-CM | POA: Diagnosis not present

## 2017-03-27 DIAGNOSIS — E871 Hypo-osmolality and hyponatremia: Secondary | ICD-10-CM | POA: Diagnosis not present

## 2017-03-27 DIAGNOSIS — J441 Chronic obstructive pulmonary disease with (acute) exacerbation: Secondary | ICD-10-CM | POA: Diagnosis not present

## 2017-03-27 DIAGNOSIS — I10 Essential (primary) hypertension: Secondary | ICD-10-CM | POA: Diagnosis not present

## 2017-03-27 DIAGNOSIS — I2510 Atherosclerotic heart disease of native coronary artery without angina pectoris: Secondary | ICD-10-CM | POA: Diagnosis not present

## 2017-03-28 DIAGNOSIS — I10 Essential (primary) hypertension: Secondary | ICD-10-CM | POA: Diagnosis not present

## 2017-03-28 DIAGNOSIS — E871 Hypo-osmolality and hyponatremia: Secondary | ICD-10-CM | POA: Diagnosis not present

## 2017-03-28 DIAGNOSIS — J441 Chronic obstructive pulmonary disease with (acute) exacerbation: Secondary | ICD-10-CM | POA: Diagnosis not present

## 2017-03-28 DIAGNOSIS — E119 Type 2 diabetes mellitus without complications: Secondary | ICD-10-CM | POA: Diagnosis not present

## 2017-03-28 DIAGNOSIS — I2510 Atherosclerotic heart disease of native coronary artery without angina pectoris: Secondary | ICD-10-CM | POA: Diagnosis not present

## 2017-03-28 DIAGNOSIS — D649 Anemia, unspecified: Secondary | ICD-10-CM | POA: Diagnosis not present

## 2017-03-29 DIAGNOSIS — I2510 Atherosclerotic heart disease of native coronary artery without angina pectoris: Secondary | ICD-10-CM | POA: Diagnosis not present

## 2017-03-29 DIAGNOSIS — J441 Chronic obstructive pulmonary disease with (acute) exacerbation: Secondary | ICD-10-CM | POA: Diagnosis not present

## 2017-03-29 DIAGNOSIS — E119 Type 2 diabetes mellitus without complications: Secondary | ICD-10-CM | POA: Diagnosis not present

## 2017-03-29 DIAGNOSIS — D649 Anemia, unspecified: Secondary | ICD-10-CM | POA: Diagnosis not present

## 2017-03-29 DIAGNOSIS — I10 Essential (primary) hypertension: Secondary | ICD-10-CM | POA: Diagnosis not present

## 2017-03-29 DIAGNOSIS — E871 Hypo-osmolality and hyponatremia: Secondary | ICD-10-CM | POA: Diagnosis not present

## 2017-04-04 DIAGNOSIS — I10 Essential (primary) hypertension: Secondary | ICD-10-CM | POA: Diagnosis not present

## 2017-04-04 DIAGNOSIS — J441 Chronic obstructive pulmonary disease with (acute) exacerbation: Secondary | ICD-10-CM | POA: Diagnosis not present

## 2017-04-04 DIAGNOSIS — D649 Anemia, unspecified: Secondary | ICD-10-CM | POA: Diagnosis not present

## 2017-04-04 DIAGNOSIS — E119 Type 2 diabetes mellitus without complications: Secondary | ICD-10-CM | POA: Diagnosis not present

## 2017-04-04 DIAGNOSIS — E871 Hypo-osmolality and hyponatremia: Secondary | ICD-10-CM | POA: Diagnosis not present

## 2017-04-04 DIAGNOSIS — I2510 Atherosclerotic heart disease of native coronary artery without angina pectoris: Secondary | ICD-10-CM | POA: Diagnosis not present

## 2017-04-05 DIAGNOSIS — I2510 Atherosclerotic heart disease of native coronary artery without angina pectoris: Secondary | ICD-10-CM | POA: Diagnosis not present

## 2017-04-05 DIAGNOSIS — D649 Anemia, unspecified: Secondary | ICD-10-CM | POA: Diagnosis not present

## 2017-04-05 DIAGNOSIS — E119 Type 2 diabetes mellitus without complications: Secondary | ICD-10-CM | POA: Diagnosis not present

## 2017-04-05 DIAGNOSIS — J441 Chronic obstructive pulmonary disease with (acute) exacerbation: Secondary | ICD-10-CM | POA: Diagnosis not present

## 2017-04-05 DIAGNOSIS — E871 Hypo-osmolality and hyponatremia: Secondary | ICD-10-CM | POA: Diagnosis not present

## 2017-04-05 DIAGNOSIS — I10 Essential (primary) hypertension: Secondary | ICD-10-CM | POA: Diagnosis not present

## 2017-04-10 DIAGNOSIS — M4726 Other spondylosis with radiculopathy, lumbar region: Secondary | ICD-10-CM | POA: Diagnosis not present

## 2017-04-10 DIAGNOSIS — Z7982 Long term (current) use of aspirin: Secondary | ICD-10-CM | POA: Diagnosis not present

## 2017-04-10 DIAGNOSIS — I700 Atherosclerosis of aorta: Secondary | ICD-10-CM | POA: Diagnosis not present

## 2017-04-10 DIAGNOSIS — Z79899 Other long term (current) drug therapy: Secondary | ICD-10-CM | POA: Diagnosis not present

## 2017-04-10 DIAGNOSIS — I2510 Atherosclerotic heart disease of native coronary artery without angina pectoris: Secondary | ICD-10-CM | POA: Diagnosis not present

## 2017-04-10 DIAGNOSIS — E785 Hyperlipidemia, unspecified: Secondary | ICD-10-CM | POA: Diagnosis not present

## 2017-04-10 DIAGNOSIS — M5416 Radiculopathy, lumbar region: Secondary | ICD-10-CM | POA: Diagnosis not present

## 2017-04-10 DIAGNOSIS — M5430 Sciatica, unspecified side: Secondary | ICD-10-CM | POA: Diagnosis not present

## 2017-04-10 DIAGNOSIS — Z888 Allergy status to other drugs, medicaments and biological substances status: Secondary | ICD-10-CM | POA: Diagnosis not present

## 2017-04-10 DIAGNOSIS — K219 Gastro-esophageal reflux disease without esophagitis: Secondary | ICD-10-CM | POA: Diagnosis not present

## 2017-04-10 DIAGNOSIS — G8929 Other chronic pain: Secondary | ICD-10-CM | POA: Diagnosis not present

## 2017-04-10 DIAGNOSIS — M545 Low back pain: Secondary | ICD-10-CM | POA: Diagnosis not present

## 2017-04-10 DIAGNOSIS — M79606 Pain in leg, unspecified: Secondary | ICD-10-CM | POA: Diagnosis not present

## 2017-04-10 DIAGNOSIS — I10 Essential (primary) hypertension: Secondary | ICD-10-CM | POA: Diagnosis not present

## 2017-04-10 DIAGNOSIS — Z981 Arthrodesis status: Secondary | ICD-10-CM | POA: Diagnosis not present

## 2017-04-10 DIAGNOSIS — Z7984 Long term (current) use of oral hypoglycemic drugs: Secondary | ICD-10-CM | POA: Diagnosis not present

## 2017-04-10 DIAGNOSIS — G4733 Obstructive sleep apnea (adult) (pediatric): Secondary | ICD-10-CM | POA: Diagnosis not present

## 2017-04-10 DIAGNOSIS — N400 Enlarged prostate without lower urinary tract symptoms: Secondary | ICD-10-CM | POA: Diagnosis not present

## 2017-04-10 DIAGNOSIS — E119 Type 2 diabetes mellitus without complications: Secondary | ICD-10-CM | POA: Diagnosis not present

## 2017-04-10 DIAGNOSIS — Z87891 Personal history of nicotine dependence: Secondary | ICD-10-CM | POA: Diagnosis not present

## 2017-04-12 DIAGNOSIS — D649 Anemia, unspecified: Secondary | ICD-10-CM | POA: Diagnosis not present

## 2017-04-12 DIAGNOSIS — E119 Type 2 diabetes mellitus without complications: Secondary | ICD-10-CM | POA: Diagnosis not present

## 2017-04-12 DIAGNOSIS — J441 Chronic obstructive pulmonary disease with (acute) exacerbation: Secondary | ICD-10-CM | POA: Diagnosis not present

## 2017-04-12 DIAGNOSIS — I10 Essential (primary) hypertension: Secondary | ICD-10-CM | POA: Diagnosis not present

## 2017-04-12 DIAGNOSIS — E871 Hypo-osmolality and hyponatremia: Secondary | ICD-10-CM | POA: Diagnosis not present

## 2017-04-12 DIAGNOSIS — I2510 Atherosclerotic heart disease of native coronary artery without angina pectoris: Secondary | ICD-10-CM | POA: Diagnosis not present

## 2017-04-24 DIAGNOSIS — Z5181 Encounter for therapeutic drug level monitoring: Secondary | ICD-10-CM | POA: Diagnosis not present

## 2017-04-24 DIAGNOSIS — F331 Major depressive disorder, recurrent, moderate: Secondary | ICD-10-CM | POA: Diagnosis not present

## 2017-05-10 DIAGNOSIS — I2510 Atherosclerotic heart disease of native coronary artery without angina pectoris: Secondary | ICD-10-CM | POA: Diagnosis not present

## 2017-05-10 DIAGNOSIS — I10 Essential (primary) hypertension: Secondary | ICD-10-CM | POA: Diagnosis not present

## 2017-05-10 DIAGNOSIS — J441 Chronic obstructive pulmonary disease with (acute) exacerbation: Secondary | ICD-10-CM | POA: Diagnosis not present

## 2017-05-10 DIAGNOSIS — E119 Type 2 diabetes mellitus without complications: Secondary | ICD-10-CM | POA: Diagnosis not present

## 2017-05-16 DIAGNOSIS — M19012 Primary osteoarthritis, left shoulder: Secondary | ICD-10-CM | POA: Diagnosis not present

## 2017-05-16 DIAGNOSIS — I2510 Atherosclerotic heart disease of native coronary artery without angina pectoris: Secondary | ICD-10-CM | POA: Diagnosis not present

## 2017-05-16 DIAGNOSIS — I10 Essential (primary) hypertension: Secondary | ICD-10-CM | POA: Diagnosis not present

## 2017-06-12 DIAGNOSIS — R200 Anesthesia of skin: Secondary | ICD-10-CM | POA: Diagnosis not present

## 2017-06-12 DIAGNOSIS — I6789 Other cerebrovascular disease: Secondary | ICD-10-CM | POA: Diagnosis not present

## 2017-06-15 DIAGNOSIS — M19012 Primary osteoarthritis, left shoulder: Secondary | ICD-10-CM | POA: Diagnosis not present

## 2017-06-15 DIAGNOSIS — M545 Low back pain: Secondary | ICD-10-CM | POA: Diagnosis not present

## 2017-06-15 DIAGNOSIS — I2510 Atherosclerotic heart disease of native coronary artery without angina pectoris: Secondary | ICD-10-CM | POA: Diagnosis not present

## 2017-06-15 DIAGNOSIS — I10 Essential (primary) hypertension: Secondary | ICD-10-CM | POA: Diagnosis not present

## 2017-06-15 DIAGNOSIS — G8929 Other chronic pain: Secondary | ICD-10-CM | POA: Diagnosis not present

## 2017-06-22 DIAGNOSIS — R7301 Impaired fasting glucose: Secondary | ICD-10-CM | POA: Diagnosis not present

## 2017-06-22 DIAGNOSIS — K219 Gastro-esophageal reflux disease without esophagitis: Secondary | ICD-10-CM | POA: Diagnosis not present

## 2017-06-22 DIAGNOSIS — I10 Essential (primary) hypertension: Secondary | ICD-10-CM | POA: Diagnosis not present

## 2017-06-28 DIAGNOSIS — M48062 Spinal stenosis, lumbar region with neurogenic claudication: Secondary | ICD-10-CM | POA: Diagnosis not present

## 2017-06-28 DIAGNOSIS — M47816 Spondylosis without myelopathy or radiculopathy, lumbar region: Secondary | ICD-10-CM | POA: Diagnosis not present

## 2017-06-28 DIAGNOSIS — M19012 Primary osteoarthritis, left shoulder: Secondary | ICD-10-CM | POA: Diagnosis not present

## 2017-06-28 DIAGNOSIS — M5136 Other intervertebral disc degeneration, lumbar region: Secondary | ICD-10-CM | POA: Diagnosis not present

## 2017-06-29 DIAGNOSIS — I10 Essential (primary) hypertension: Secondary | ICD-10-CM | POA: Diagnosis not present

## 2017-06-29 DIAGNOSIS — E559 Vitamin D deficiency, unspecified: Secondary | ICD-10-CM | POA: Diagnosis not present

## 2017-06-29 DIAGNOSIS — D510 Vitamin B12 deficiency anemia due to intrinsic factor deficiency: Secondary | ICD-10-CM | POA: Diagnosis not present

## 2017-06-29 DIAGNOSIS — E785 Hyperlipidemia, unspecified: Secondary | ICD-10-CM | POA: Diagnosis not present

## 2017-06-29 DIAGNOSIS — Z125 Encounter for screening for malignant neoplasm of prostate: Secondary | ICD-10-CM | POA: Diagnosis not present

## 2017-07-03 DIAGNOSIS — M545 Low back pain: Secondary | ICD-10-CM | POA: Diagnosis not present

## 2017-07-03 DIAGNOSIS — G8929 Other chronic pain: Secondary | ICD-10-CM | POA: Diagnosis not present

## 2017-07-03 DIAGNOSIS — M961 Postlaminectomy syndrome, not elsewhere classified: Secondary | ICD-10-CM | POA: Diagnosis not present

## 2017-07-31 DIAGNOSIS — M961 Postlaminectomy syndrome, not elsewhere classified: Secondary | ICD-10-CM | POA: Diagnosis not present

## 2017-07-31 DIAGNOSIS — G8929 Other chronic pain: Secondary | ICD-10-CM | POA: Diagnosis not present

## 2017-07-31 DIAGNOSIS — M545 Low back pain: Secondary | ICD-10-CM | POA: Diagnosis not present

## 2017-08-01 DIAGNOSIS — I10 Essential (primary) hypertension: Secondary | ICD-10-CM | POA: Diagnosis not present

## 2017-08-01 DIAGNOSIS — D510 Vitamin B12 deficiency anemia due to intrinsic factor deficiency: Secondary | ICD-10-CM | POA: Diagnosis not present

## 2017-08-01 DIAGNOSIS — G8929 Other chronic pain: Secondary | ICD-10-CM | POA: Diagnosis not present

## 2017-08-01 DIAGNOSIS — R7301 Impaired fasting glucose: Secondary | ICD-10-CM | POA: Diagnosis not present

## 2017-08-01 DIAGNOSIS — E785 Hyperlipidemia, unspecified: Secondary | ICD-10-CM | POA: Diagnosis not present

## 2017-08-06 DIAGNOSIS — Z6834 Body mass index (BMI) 34.0-34.9, adult: Secondary | ICD-10-CM | POA: Diagnosis not present

## 2017-08-06 DIAGNOSIS — G8929 Other chronic pain: Secondary | ICD-10-CM | POA: Diagnosis not present

## 2017-08-06 DIAGNOSIS — R55 Syncope and collapse: Secondary | ICD-10-CM | POA: Diagnosis not present

## 2017-08-06 DIAGNOSIS — M546 Pain in thoracic spine: Secondary | ICD-10-CM | POA: Diagnosis not present

## 2017-08-06 DIAGNOSIS — R109 Unspecified abdominal pain: Secondary | ICD-10-CM | POA: Diagnosis not present

## 2017-08-06 DIAGNOSIS — Z981 Arthrodesis status: Secondary | ICD-10-CM | POA: Diagnosis not present

## 2017-08-06 DIAGNOSIS — E119 Type 2 diabetes mellitus without complications: Secondary | ICD-10-CM | POA: Diagnosis not present

## 2017-08-06 DIAGNOSIS — E870 Hyperosmolality and hypernatremia: Secondary | ICD-10-CM | POA: Diagnosis not present

## 2017-08-06 DIAGNOSIS — T148XXA Other injury of unspecified body region, initial encounter: Secondary | ICD-10-CM | POA: Diagnosis not present

## 2017-08-06 DIAGNOSIS — S32018A Other fracture of first lumbar vertebra, initial encounter for closed fracture: Secondary | ICD-10-CM | POA: Diagnosis not present

## 2017-08-06 DIAGNOSIS — I10 Essential (primary) hypertension: Secondary | ICD-10-CM | POA: Diagnosis not present

## 2017-08-06 DIAGNOSIS — Z79899 Other long term (current) drug therapy: Secondary | ICD-10-CM | POA: Diagnosis not present

## 2017-08-06 DIAGNOSIS — R079 Chest pain, unspecified: Secondary | ICD-10-CM | POA: Diagnosis not present

## 2017-08-06 DIAGNOSIS — M542 Cervicalgia: Secondary | ICD-10-CM | POA: Diagnosis not present

## 2017-08-06 DIAGNOSIS — M4856XA Collapsed vertebra, not elsewhere classified, lumbar region, initial encounter for fracture: Secondary | ICD-10-CM | POA: Diagnosis not present

## 2017-08-06 DIAGNOSIS — R918 Other nonspecific abnormal finding of lung field: Secondary | ICD-10-CM | POA: Diagnosis not present

## 2017-08-06 DIAGNOSIS — I2510 Atherosclerotic heart disease of native coronary artery without angina pectoris: Secondary | ICD-10-CM | POA: Diagnosis not present

## 2017-08-06 DIAGNOSIS — E785 Hyperlipidemia, unspecified: Secondary | ICD-10-CM | POA: Diagnosis not present

## 2017-08-06 DIAGNOSIS — M545 Low back pain: Secondary | ICD-10-CM | POA: Diagnosis not present

## 2017-08-07 DIAGNOSIS — R55 Syncope and collapse: Secondary | ICD-10-CM | POA: Diagnosis not present

## 2017-08-18 DIAGNOSIS — M549 Dorsalgia, unspecified: Secondary | ICD-10-CM | POA: Diagnosis not present

## 2017-08-18 DIAGNOSIS — S32019A Unspecified fracture of first lumbar vertebra, initial encounter for closed fracture: Secondary | ICD-10-CM | POA: Diagnosis not present

## 2017-08-18 DIAGNOSIS — I2510 Atherosclerotic heart disease of native coronary artery without angina pectoris: Secondary | ICD-10-CM | POA: Diagnosis not present

## 2017-08-18 DIAGNOSIS — E119 Type 2 diabetes mellitus without complications: Secondary | ICD-10-CM | POA: Diagnosis not present

## 2017-08-18 DIAGNOSIS — Z043 Encounter for examination and observation following other accident: Secondary | ICD-10-CM | POA: Diagnosis not present

## 2017-08-18 DIAGNOSIS — M8008XA Age-related osteoporosis with current pathological fracture, vertebra(e), initial encounter for fracture: Secondary | ICD-10-CM | POA: Diagnosis not present

## 2017-08-18 DIAGNOSIS — I10 Essential (primary) hypertension: Secondary | ICD-10-CM | POA: Diagnosis not present

## 2017-08-18 DIAGNOSIS — R52 Pain, unspecified: Secondary | ICD-10-CM | POA: Diagnosis not present

## 2017-08-18 DIAGNOSIS — G4733 Obstructive sleep apnea (adult) (pediatric): Secondary | ICD-10-CM | POA: Diagnosis not present

## 2017-08-18 DIAGNOSIS — M5489 Other dorsalgia: Secondary | ICD-10-CM | POA: Diagnosis not present

## 2017-08-18 DIAGNOSIS — Z981 Arthrodesis status: Secondary | ICD-10-CM | POA: Diagnosis not present

## 2017-08-18 DIAGNOSIS — M4856XA Collapsed vertebra, not elsewhere classified, lumbar region, initial encounter for fracture: Secondary | ICD-10-CM | POA: Diagnosis not present

## 2017-08-18 DIAGNOSIS — R45851 Suicidal ideations: Secondary | ICD-10-CM | POA: Diagnosis not present

## 2017-08-19 DIAGNOSIS — I2510 Atherosclerotic heart disease of native coronary artery without angina pectoris: Secondary | ICD-10-CM | POA: Diagnosis not present

## 2017-08-19 DIAGNOSIS — Z9889 Other specified postprocedural states: Secondary | ICD-10-CM | POA: Diagnosis not present

## 2017-08-19 DIAGNOSIS — M8000XG Age-related osteoporosis with current pathological fracture, unspecified site, subsequent encounter for fracture with delayed healing: Secondary | ICD-10-CM | POA: Diagnosis not present

## 2017-08-19 DIAGNOSIS — S32010A Wedge compression fracture of first lumbar vertebra, initial encounter for closed fracture: Secondary | ICD-10-CM | POA: Diagnosis not present

## 2017-08-20 DIAGNOSIS — Z79899 Other long term (current) drug therapy: Secondary | ICD-10-CM | POA: Diagnosis not present

## 2017-08-20 DIAGNOSIS — Z981 Arthrodesis status: Secondary | ICD-10-CM | POA: Diagnosis not present

## 2017-08-20 DIAGNOSIS — E119 Type 2 diabetes mellitus without complications: Secondary | ICD-10-CM | POA: Diagnosis not present

## 2017-08-20 DIAGNOSIS — S32010A Wedge compression fracture of first lumbar vertebra, initial encounter for closed fracture: Secondary | ICD-10-CM | POA: Diagnosis not present

## 2017-08-20 DIAGNOSIS — Z87891 Personal history of nicotine dependence: Secondary | ICD-10-CM | POA: Diagnosis not present

## 2017-08-20 DIAGNOSIS — G4733 Obstructive sleep apnea (adult) (pediatric): Secondary | ICD-10-CM | POA: Diagnosis not present

## 2017-08-20 DIAGNOSIS — M549 Dorsalgia, unspecified: Secondary | ICD-10-CM | POA: Diagnosis not present

## 2017-08-20 DIAGNOSIS — R531 Weakness: Secondary | ICD-10-CM | POA: Diagnosis not present

## 2017-08-20 DIAGNOSIS — F4325 Adjustment disorder with mixed disturbance of emotions and conduct: Secondary | ICD-10-CM | POA: Diagnosis present

## 2017-08-20 DIAGNOSIS — E6609 Other obesity due to excess calories: Secondary | ICD-10-CM | POA: Diagnosis not present

## 2017-08-20 DIAGNOSIS — Z9889 Other specified postprocedural states: Secondary | ICD-10-CM | POA: Diagnosis not present

## 2017-08-20 DIAGNOSIS — K219 Gastro-esophageal reflux disease without esophagitis: Secondary | ICD-10-CM | POA: Diagnosis present

## 2017-08-20 DIAGNOSIS — R45851 Suicidal ideations: Secondary | ICD-10-CM | POA: Diagnosis present

## 2017-08-20 DIAGNOSIS — M8008XA Age-related osteoporosis with current pathological fracture, vertebra(e), initial encounter for fracture: Secondary | ICD-10-CM | POA: Diagnosis present

## 2017-08-20 DIAGNOSIS — Z6833 Body mass index (BMI) 33.0-33.9, adult: Secondary | ICD-10-CM | POA: Diagnosis not present

## 2017-08-20 DIAGNOSIS — Z7982 Long term (current) use of aspirin: Secondary | ICD-10-CM | POA: Diagnosis not present

## 2017-08-20 DIAGNOSIS — D638 Anemia in other chronic diseases classified elsewhere: Secondary | ICD-10-CM | POA: Diagnosis present

## 2017-08-20 DIAGNOSIS — Z7984 Long term (current) use of oral hypoglycemic drugs: Secondary | ICD-10-CM | POA: Diagnosis not present

## 2017-08-20 DIAGNOSIS — M5136 Other intervertebral disc degeneration, lumbar region: Secondary | ICD-10-CM | POA: Diagnosis present

## 2017-08-20 DIAGNOSIS — M4856XA Collapsed vertebra, not elsewhere classified, lumbar region, initial encounter for fracture: Secondary | ICD-10-CM | POA: Diagnosis not present

## 2017-08-20 DIAGNOSIS — Z9119 Patient's noncompliance with other medical treatment and regimen: Secondary | ICD-10-CM | POA: Diagnosis not present

## 2017-08-20 DIAGNOSIS — Z791 Long term (current) use of non-steroidal anti-inflammatories (NSAID): Secondary | ICD-10-CM | POA: Diagnosis not present

## 2017-08-20 DIAGNOSIS — S32010D Wedge compression fracture of first lumbar vertebra, subsequent encounter for fracture with routine healing: Secondary | ICD-10-CM | POA: Diagnosis not present

## 2017-08-20 DIAGNOSIS — I10 Essential (primary) hypertension: Secondary | ICD-10-CM | POA: Diagnosis not present

## 2017-08-20 DIAGNOSIS — E669 Obesity, unspecified: Secondary | ICD-10-CM | POA: Diagnosis not present

## 2017-08-20 DIAGNOSIS — G894 Chronic pain syndrome: Secondary | ICD-10-CM | POA: Diagnosis not present

## 2017-08-20 DIAGNOSIS — F4322 Adjustment disorder with anxiety: Secondary | ICD-10-CM | POA: Diagnosis present

## 2017-08-20 DIAGNOSIS — N400 Enlarged prostate without lower urinary tract symptoms: Secondary | ICD-10-CM | POA: Diagnosis not present

## 2017-08-20 DIAGNOSIS — I2510 Atherosclerotic heart disease of native coronary artery without angina pectoris: Secondary | ICD-10-CM | POA: Diagnosis not present

## 2017-08-29 DIAGNOSIS — M545 Low back pain: Secondary | ICD-10-CM | POA: Diagnosis not present

## 2017-08-29 DIAGNOSIS — G8929 Other chronic pain: Secondary | ICD-10-CM | POA: Diagnosis not present

## 2017-08-29 DIAGNOSIS — M961 Postlaminectomy syndrome, not elsewhere classified: Secondary | ICD-10-CM | POA: Diagnosis not present

## 2017-09-06 DIAGNOSIS — M545 Low back pain: Secondary | ICD-10-CM | POA: Diagnosis not present

## 2017-09-08 DIAGNOSIS — E669 Obesity, unspecified: Secondary | ICD-10-CM | POA: Diagnosis not present

## 2017-09-08 DIAGNOSIS — I10 Essential (primary) hypertension: Secondary | ICD-10-CM | POA: Diagnosis not present

## 2017-09-08 DIAGNOSIS — M545 Low back pain: Secondary | ICD-10-CM | POA: Diagnosis not present

## 2017-09-08 DIAGNOSIS — E785 Hyperlipidemia, unspecified: Secondary | ICD-10-CM | POA: Diagnosis not present

## 2017-09-08 DIAGNOSIS — Z981 Arthrodesis status: Secondary | ICD-10-CM | POA: Diagnosis not present

## 2017-09-08 DIAGNOSIS — Z791 Long term (current) use of non-steroidal anti-inflammatories (NSAID): Secondary | ICD-10-CM | POA: Diagnosis not present

## 2017-09-08 DIAGNOSIS — K219 Gastro-esophageal reflux disease without esophagitis: Secondary | ICD-10-CM | POA: Diagnosis not present

## 2017-09-08 DIAGNOSIS — G8929 Other chronic pain: Secondary | ICD-10-CM | POA: Diagnosis not present

## 2017-09-08 DIAGNOSIS — I2510 Atherosclerotic heart disease of native coronary artery without angina pectoris: Secondary | ICD-10-CM | POA: Diagnosis not present

## 2017-09-08 DIAGNOSIS — Z969 Presence of functional implant, unspecified: Secondary | ICD-10-CM | POA: Diagnosis not present

## 2017-09-08 DIAGNOSIS — Z7984 Long term (current) use of oral hypoglycemic drugs: Secondary | ICD-10-CM | POA: Diagnosis not present

## 2017-09-08 DIAGNOSIS — Z79891 Long term (current) use of opiate analgesic: Secondary | ICD-10-CM | POA: Diagnosis not present

## 2017-09-08 DIAGNOSIS — Z87891 Personal history of nicotine dependence: Secondary | ICD-10-CM | POA: Diagnosis not present

## 2017-09-08 DIAGNOSIS — E119 Type 2 diabetes mellitus without complications: Secondary | ICD-10-CM | POA: Diagnosis not present

## 2017-09-08 DIAGNOSIS — Z6833 Body mass index (BMI) 33.0-33.9, adult: Secondary | ICD-10-CM | POA: Diagnosis not present

## 2017-09-08 DIAGNOSIS — Z79899 Other long term (current) drug therapy: Secondary | ICD-10-CM | POA: Diagnosis not present

## 2017-09-08 DIAGNOSIS — Z888 Allergy status to other drugs, medicaments and biological substances status: Secondary | ICD-10-CM | POA: Diagnosis not present

## 2017-09-08 DIAGNOSIS — R600 Localized edema: Secondary | ICD-10-CM | POA: Diagnosis not present

## 2017-09-20 DIAGNOSIS — M545 Low back pain: Secondary | ICD-10-CM | POA: Diagnosis not present

## 2017-09-20 DIAGNOSIS — G8929 Other chronic pain: Secondary | ICD-10-CM | POA: Diagnosis not present

## 2017-09-20 DIAGNOSIS — M961 Postlaminectomy syndrome, not elsewhere classified: Secondary | ICD-10-CM | POA: Diagnosis not present

## 2017-09-25 DIAGNOSIS — M5442 Lumbago with sciatica, left side: Secondary | ICD-10-CM | POA: Diagnosis not present

## 2017-09-25 DIAGNOSIS — R32 Unspecified urinary incontinence: Secondary | ICD-10-CM | POA: Diagnosis not present

## 2017-09-25 DIAGNOSIS — R45851 Suicidal ideations: Secondary | ICD-10-CM | POA: Diagnosis not present

## 2017-09-25 DIAGNOSIS — M47896 Other spondylosis, lumbar region: Secondary | ICD-10-CM | POA: Diagnosis not present

## 2017-09-25 DIAGNOSIS — R531 Weakness: Secondary | ICD-10-CM | POA: Diagnosis not present

## 2017-09-25 DIAGNOSIS — M4316 Spondylolisthesis, lumbar region: Secondary | ICD-10-CM | POA: Diagnosis not present

## 2017-09-25 DIAGNOSIS — M79606 Pain in leg, unspecified: Secondary | ICD-10-CM | POA: Diagnosis not present

## 2017-09-25 DIAGNOSIS — M549 Dorsalgia, unspecified: Secondary | ICD-10-CM | POA: Diagnosis not present

## 2017-09-25 DIAGNOSIS — M545 Low back pain: Secondary | ICD-10-CM | POA: Diagnosis not present

## 2017-09-25 DIAGNOSIS — M5136 Other intervertebral disc degeneration, lumbar region: Secondary | ICD-10-CM | POA: Diagnosis not present

## 2017-09-25 DIAGNOSIS — R202 Paresthesia of skin: Secondary | ICD-10-CM | POA: Diagnosis not present

## 2017-09-25 DIAGNOSIS — M5432 Sciatica, left side: Secondary | ICD-10-CM | POA: Diagnosis not present

## 2017-09-25 DIAGNOSIS — G8929 Other chronic pain: Secondary | ICD-10-CM | POA: Diagnosis not present

## 2017-09-25 DIAGNOSIS — M48061 Spinal stenosis, lumbar region without neurogenic claudication: Secondary | ICD-10-CM | POA: Diagnosis not present

## 2017-09-26 DIAGNOSIS — M5440 Lumbago with sciatica, unspecified side: Secondary | ICD-10-CM | POA: Diagnosis not present

## 2017-09-26 DIAGNOSIS — I2510 Atherosclerotic heart disease of native coronary artery without angina pectoris: Secondary | ICD-10-CM | POA: Diagnosis not present

## 2017-09-26 DIAGNOSIS — I10 Essential (primary) hypertension: Secondary | ICD-10-CM | POA: Diagnosis not present

## 2017-09-27 DIAGNOSIS — I10 Essential (primary) hypertension: Secondary | ICD-10-CM | POA: Diagnosis not present

## 2017-09-27 DIAGNOSIS — I2510 Atherosclerotic heart disease of native coronary artery without angina pectoris: Secondary | ICD-10-CM | POA: Diagnosis not present

## 2017-09-27 DIAGNOSIS — M5440 Lumbago with sciatica, unspecified side: Secondary | ICD-10-CM | POA: Diagnosis not present

## 2017-09-28 DIAGNOSIS — M545 Low back pain: Secondary | ICD-10-CM | POA: Diagnosis not present

## 2017-09-28 DIAGNOSIS — G8929 Other chronic pain: Secondary | ICD-10-CM | POA: Diagnosis not present

## 2017-09-28 DIAGNOSIS — M79606 Pain in leg, unspecified: Secondary | ICD-10-CM | POA: Diagnosis not present

## 2017-09-28 DIAGNOSIS — M5136 Other intervertebral disc degeneration, lumbar region: Secondary | ICD-10-CM | POA: Diagnosis not present

## 2017-09-28 DIAGNOSIS — M549 Dorsalgia, unspecified: Secondary | ICD-10-CM | POA: Diagnosis not present

## 2017-09-28 DIAGNOSIS — M5442 Lumbago with sciatica, left side: Secondary | ICD-10-CM | POA: Diagnosis not present

## 2017-09-28 DIAGNOSIS — M48061 Spinal stenosis, lumbar region without neurogenic claudication: Secondary | ICD-10-CM | POA: Diagnosis not present

## 2017-09-28 DIAGNOSIS — R200 Anesthesia of skin: Secondary | ICD-10-CM | POA: Diagnosis not present

## 2017-09-28 DIAGNOSIS — M4856XA Collapsed vertebra, not elsewhere classified, lumbar region, initial encounter for fracture: Secondary | ICD-10-CM | POA: Diagnosis not present

## 2017-09-28 DIAGNOSIS — I10 Essential (primary) hypertension: Secondary | ICD-10-CM | POA: Diagnosis not present

## 2017-09-28 DIAGNOSIS — M5440 Lumbago with sciatica, unspecified side: Secondary | ICD-10-CM | POA: Diagnosis not present

## 2017-09-28 DIAGNOSIS — R202 Paresthesia of skin: Secondary | ICD-10-CM | POA: Diagnosis not present

## 2017-09-28 DIAGNOSIS — I2510 Atherosclerotic heart disease of native coronary artery without angina pectoris: Secondary | ICD-10-CM | POA: Diagnosis not present

## 2017-09-29 DIAGNOSIS — M5440 Lumbago with sciatica, unspecified side: Secondary | ICD-10-CM | POA: Diagnosis not present

## 2017-09-29 DIAGNOSIS — M5442 Lumbago with sciatica, left side: Secondary | ICD-10-CM | POA: Diagnosis not present

## 2017-09-29 DIAGNOSIS — G8929 Other chronic pain: Secondary | ICD-10-CM | POA: Diagnosis not present

## 2017-09-29 DIAGNOSIS — I2510 Atherosclerotic heart disease of native coronary artery without angina pectoris: Secondary | ICD-10-CM | POA: Diagnosis not present

## 2017-09-29 DIAGNOSIS — I080 Rheumatic disorders of both mitral and aortic valves: Secondary | ICD-10-CM | POA: Diagnosis not present

## 2017-09-29 DIAGNOSIS — I10 Essential (primary) hypertension: Secondary | ICD-10-CM | POA: Diagnosis not present

## 2017-09-29 DIAGNOSIS — M4800 Spinal stenosis, site unspecified: Secondary | ICD-10-CM | POA: Diagnosis not present

## 2017-09-30 DIAGNOSIS — I10 Essential (primary) hypertension: Secondary | ICD-10-CM | POA: Diagnosis not present

## 2017-09-30 DIAGNOSIS — M4800 Spinal stenosis, site unspecified: Secondary | ICD-10-CM | POA: Diagnosis not present

## 2017-09-30 DIAGNOSIS — I2510 Atherosclerotic heart disease of native coronary artery without angina pectoris: Secondary | ICD-10-CM | POA: Diagnosis not present

## 2017-09-30 DIAGNOSIS — M5440 Lumbago with sciatica, unspecified side: Secondary | ICD-10-CM | POA: Diagnosis not present

## 2017-10-23 DIAGNOSIS — R918 Other nonspecific abnormal finding of lung field: Secondary | ICD-10-CM | POA: Diagnosis not present

## 2017-10-23 DIAGNOSIS — I10 Essential (primary) hypertension: Secondary | ICD-10-CM | POA: Diagnosis not present

## 2017-10-23 DIAGNOSIS — R4182 Altered mental status, unspecified: Secondary | ICD-10-CM | POA: Diagnosis not present

## 2017-10-23 DIAGNOSIS — Z66 Do not resuscitate: Secondary | ICD-10-CM | POA: Diagnosis not present

## 2017-10-23 DIAGNOSIS — G4733 Obstructive sleep apnea (adult) (pediatric): Secondary | ICD-10-CM | POA: Diagnosis not present

## 2017-10-23 DIAGNOSIS — J209 Acute bronchitis, unspecified: Secondary | ICD-10-CM | POA: Diagnosis not present

## 2017-10-23 DIAGNOSIS — R0989 Other specified symptoms and signs involving the circulatory and respiratory systems: Secondary | ICD-10-CM | POA: Diagnosis not present

## 2017-10-23 DIAGNOSIS — S199XXA Unspecified injury of neck, initial encounter: Secondary | ICD-10-CM | POA: Diagnosis not present

## 2017-10-23 DIAGNOSIS — M47892 Other spondylosis, cervical region: Secondary | ICD-10-CM | POA: Diagnosis not present

## 2017-10-23 DIAGNOSIS — G9389 Other specified disorders of brain: Secondary | ICD-10-CM | POA: Diagnosis not present

## 2017-10-23 DIAGNOSIS — G92 Toxic encephalopathy: Secondary | ICD-10-CM | POA: Diagnosis not present

## 2017-10-23 DIAGNOSIS — D72829 Elevated white blood cell count, unspecified: Secondary | ICD-10-CM | POA: Diagnosis not present

## 2017-10-24 DIAGNOSIS — I2510 Atherosclerotic heart disease of native coronary artery without angina pectoris: Secondary | ICD-10-CM | POA: Diagnosis present

## 2017-10-24 DIAGNOSIS — M810 Age-related osteoporosis without current pathological fracture: Secondary | ICD-10-CM | POA: Diagnosis present

## 2017-10-24 DIAGNOSIS — M1612 Unilateral primary osteoarthritis, left hip: Secondary | ICD-10-CM | POA: Diagnosis not present

## 2017-10-24 DIAGNOSIS — S79911A Unspecified injury of right hip, initial encounter: Secondary | ICD-10-CM | POA: Diagnosis not present

## 2017-10-24 DIAGNOSIS — K219 Gastro-esophageal reflux disease without esophagitis: Secondary | ICD-10-CM | POA: Diagnosis not present

## 2017-10-24 DIAGNOSIS — N400 Enlarged prostate without lower urinary tract symptoms: Secondary | ICD-10-CM | POA: Diagnosis present

## 2017-10-24 DIAGNOSIS — M85851 Other specified disorders of bone density and structure, right thigh: Secondary | ICD-10-CM | POA: Diagnosis not present

## 2017-10-24 DIAGNOSIS — E669 Obesity, unspecified: Secondary | ICD-10-CM | POA: Diagnosis present

## 2017-10-24 DIAGNOSIS — G9340 Encephalopathy, unspecified: Secondary | ICD-10-CM | POA: Diagnosis not present

## 2017-10-24 DIAGNOSIS — T40605A Adverse effect of unspecified narcotics, initial encounter: Secondary | ICD-10-CM | POA: Diagnosis present

## 2017-10-24 DIAGNOSIS — I10 Essential (primary) hypertension: Secondary | ICD-10-CM | POA: Diagnosis not present

## 2017-10-24 DIAGNOSIS — Z66 Do not resuscitate: Secondary | ICD-10-CM | POA: Diagnosis present

## 2017-10-24 DIAGNOSIS — E785 Hyperlipidemia, unspecified: Secondary | ICD-10-CM | POA: Diagnosis present

## 2017-10-24 DIAGNOSIS — J208 Acute bronchitis due to other specified organisms: Secondary | ICD-10-CM | POA: Diagnosis not present

## 2017-10-24 DIAGNOSIS — Z79899 Other long term (current) drug therapy: Secondary | ICD-10-CM | POA: Diagnosis not present

## 2017-10-24 DIAGNOSIS — E119 Type 2 diabetes mellitus without complications: Secondary | ICD-10-CM | POA: Diagnosis not present

## 2017-10-24 DIAGNOSIS — Z7982 Long term (current) use of aspirin: Secondary | ICD-10-CM | POA: Diagnosis not present

## 2017-10-24 DIAGNOSIS — Z6834 Body mass index (BMI) 34.0-34.9, adult: Secondary | ICD-10-CM | POA: Diagnosis not present

## 2017-10-24 DIAGNOSIS — M549 Dorsalgia, unspecified: Secondary | ICD-10-CM | POA: Diagnosis present

## 2017-10-24 DIAGNOSIS — J209 Acute bronchitis, unspecified: Secondary | ICD-10-CM | POA: Diagnosis present

## 2017-10-24 DIAGNOSIS — G8929 Other chronic pain: Secondary | ICD-10-CM | POA: Diagnosis present

## 2017-10-24 DIAGNOSIS — Z87891 Personal history of nicotine dependence: Secondary | ICD-10-CM | POA: Diagnosis not present

## 2017-10-24 DIAGNOSIS — G4733 Obstructive sleep apnea (adult) (pediatric): Secondary | ICD-10-CM | POA: Diagnosis not present

## 2017-10-24 DIAGNOSIS — D72829 Elevated white blood cell count, unspecified: Secondary | ICD-10-CM | POA: Diagnosis not present

## 2017-10-24 DIAGNOSIS — G92 Toxic encephalopathy: Secondary | ICD-10-CM | POA: Diagnosis present

## 2017-10-31 DIAGNOSIS — M545 Low back pain: Secondary | ICD-10-CM | POA: Diagnosis not present

## 2017-10-31 DIAGNOSIS — G8929 Other chronic pain: Secondary | ICD-10-CM | POA: Diagnosis not present

## 2017-10-31 DIAGNOSIS — Z79891 Long term (current) use of opiate analgesic: Secondary | ICD-10-CM | POA: Diagnosis not present

## 2017-10-31 DIAGNOSIS — M961 Postlaminectomy syndrome, not elsewhere classified: Secondary | ICD-10-CM | POA: Diagnosis not present

## 2017-11-21 DIAGNOSIS — Z87891 Personal history of nicotine dependence: Secondary | ICD-10-CM | POA: Diagnosis not present

## 2017-11-21 DIAGNOSIS — M545 Low back pain: Secondary | ICD-10-CM | POA: Diagnosis not present

## 2017-11-21 DIAGNOSIS — Z91041 Radiographic dye allergy status: Secondary | ICD-10-CM | POA: Diagnosis not present

## 2017-11-21 DIAGNOSIS — M4856XA Collapsed vertebra, not elsewhere classified, lumbar region, initial encounter for fracture: Secondary | ICD-10-CM | POA: Diagnosis not present

## 2017-11-21 DIAGNOSIS — Z79891 Long term (current) use of opiate analgesic: Secondary | ICD-10-CM | POA: Diagnosis not present

## 2017-11-21 DIAGNOSIS — M542 Cervicalgia: Secondary | ICD-10-CM | POA: Diagnosis not present

## 2017-11-21 DIAGNOSIS — K219 Gastro-esophageal reflux disease without esophagitis: Secondary | ICD-10-CM | POA: Diagnosis not present

## 2017-11-21 DIAGNOSIS — M47896 Other spondylosis, lumbar region: Secondary | ICD-10-CM | POA: Diagnosis not present

## 2017-11-21 DIAGNOSIS — N400 Enlarged prostate without lower urinary tract symptoms: Secondary | ICD-10-CM | POA: Diagnosis not present

## 2017-11-21 DIAGNOSIS — R52 Pain, unspecified: Secondary | ICD-10-CM | POA: Diagnosis not present

## 2017-11-21 DIAGNOSIS — M5489 Other dorsalgia: Secondary | ICD-10-CM | POA: Diagnosis not present

## 2017-11-21 DIAGNOSIS — M47892 Other spondylosis, cervical region: Secondary | ICD-10-CM | POA: Diagnosis not present

## 2017-11-21 DIAGNOSIS — E785 Hyperlipidemia, unspecified: Secondary | ICD-10-CM | POA: Diagnosis not present

## 2017-11-21 DIAGNOSIS — M438X4 Other specified deforming dorsopathies, thoracic region: Secondary | ICD-10-CM | POA: Diagnosis not present

## 2017-11-21 DIAGNOSIS — M2578 Osteophyte, vertebrae: Secondary | ICD-10-CM | POA: Diagnosis not present

## 2017-11-21 DIAGNOSIS — M4314 Spondylolisthesis, thoracic region: Secondary | ICD-10-CM | POA: Diagnosis not present

## 2017-11-21 DIAGNOSIS — M5135 Other intervertebral disc degeneration, thoracolumbar region: Secondary | ICD-10-CM | POA: Diagnosis not present

## 2017-11-21 DIAGNOSIS — M47894 Other spondylosis, thoracic region: Secondary | ICD-10-CM | POA: Diagnosis not present

## 2017-11-21 DIAGNOSIS — G4733 Obstructive sleep apnea (adult) (pediatric): Secondary | ICD-10-CM | POA: Diagnosis not present

## 2017-11-21 DIAGNOSIS — M5030 Other cervical disc degeneration, unspecified cervical region: Secondary | ICD-10-CM | POA: Diagnosis not present

## 2017-11-21 DIAGNOSIS — E669 Obesity, unspecified: Secondary | ICD-10-CM | POA: Diagnosis not present

## 2017-11-21 DIAGNOSIS — I10 Essential (primary) hypertension: Secondary | ICD-10-CM | POA: Diagnosis not present

## 2017-11-21 DIAGNOSIS — G8929 Other chronic pain: Secondary | ICD-10-CM | POA: Diagnosis not present

## 2017-11-21 DIAGNOSIS — M549 Dorsalgia, unspecified: Secondary | ICD-10-CM | POA: Diagnosis not present

## 2017-11-21 DIAGNOSIS — Z79899 Other long term (current) drug therapy: Secondary | ICD-10-CM | POA: Diagnosis not present

## 2017-11-21 DIAGNOSIS — E119 Type 2 diabetes mellitus without complications: Secondary | ICD-10-CM | POA: Diagnosis not present

## 2017-11-21 DIAGNOSIS — M4316 Spondylolisthesis, lumbar region: Secondary | ICD-10-CM | POA: Diagnosis not present

## 2017-11-21 DIAGNOSIS — I2510 Atherosclerotic heart disease of native coronary artery without angina pectoris: Secondary | ICD-10-CM | POA: Diagnosis not present

## 2017-11-22 DIAGNOSIS — M549 Dorsalgia, unspecified: Secondary | ICD-10-CM | POA: Diagnosis not present

## 2017-11-22 DIAGNOSIS — Z79891 Long term (current) use of opiate analgesic: Secondary | ICD-10-CM | POA: Diagnosis not present

## 2017-11-22 DIAGNOSIS — Z79899 Other long term (current) drug therapy: Secondary | ICD-10-CM | POA: Diagnosis not present

## 2017-11-23 DIAGNOSIS — G8929 Other chronic pain: Secondary | ICD-10-CM | POA: Diagnosis not present

## 2017-11-23 DIAGNOSIS — E119 Type 2 diabetes mellitus without complications: Secondary | ICD-10-CM | POA: Diagnosis not present

## 2017-11-23 DIAGNOSIS — N400 Enlarged prostate without lower urinary tract symptoms: Secondary | ICD-10-CM | POA: Diagnosis not present

## 2017-11-23 DIAGNOSIS — M545 Low back pain: Secondary | ICD-10-CM | POA: Diagnosis not present

## 2017-11-23 DIAGNOSIS — E785 Hyperlipidemia, unspecified: Secondary | ICD-10-CM | POA: Diagnosis not present

## 2017-11-24 DIAGNOSIS — M545 Low back pain: Secondary | ICD-10-CM | POA: Diagnosis not present

## 2017-11-24 DIAGNOSIS — E785 Hyperlipidemia, unspecified: Secondary | ICD-10-CM | POA: Diagnosis not present

## 2017-11-24 DIAGNOSIS — N400 Enlarged prostate without lower urinary tract symptoms: Secondary | ICD-10-CM | POA: Diagnosis not present

## 2017-11-24 DIAGNOSIS — G8929 Other chronic pain: Secondary | ICD-10-CM | POA: Diagnosis not present

## 2017-11-24 DIAGNOSIS — E119 Type 2 diabetes mellitus without complications: Secondary | ICD-10-CM | POA: Diagnosis not present

## 2017-11-25 DIAGNOSIS — E119 Type 2 diabetes mellitus without complications: Secondary | ICD-10-CM | POA: Diagnosis not present

## 2017-11-25 DIAGNOSIS — N400 Enlarged prostate without lower urinary tract symptoms: Secondary | ICD-10-CM | POA: Diagnosis not present

## 2017-11-25 DIAGNOSIS — E785 Hyperlipidemia, unspecified: Secondary | ICD-10-CM | POA: Diagnosis not present

## 2017-11-25 DIAGNOSIS — G8929 Other chronic pain: Secondary | ICD-10-CM | POA: Diagnosis not present

## 2017-11-25 DIAGNOSIS — M545 Low back pain: Secondary | ICD-10-CM | POA: Diagnosis not present

## 2017-11-28 DIAGNOSIS — G8929 Other chronic pain: Secondary | ICD-10-CM | POA: Diagnosis not present

## 2017-11-28 DIAGNOSIS — Z79891 Long term (current) use of opiate analgesic: Secondary | ICD-10-CM | POA: Diagnosis not present

## 2017-11-28 DIAGNOSIS — M545 Low back pain: Secondary | ICD-10-CM | POA: Diagnosis not present

## 2017-11-28 DIAGNOSIS — M961 Postlaminectomy syndrome, not elsewhere classified: Secondary | ICD-10-CM | POA: Diagnosis not present

## 2017-12-18 DIAGNOSIS — R030 Elevated blood-pressure reading, without diagnosis of hypertension: Secondary | ICD-10-CM | POA: Diagnosis not present

## 2017-12-18 DIAGNOSIS — E119 Type 2 diabetes mellitus without complications: Secondary | ICD-10-CM | POA: Diagnosis not present

## 2017-12-18 DIAGNOSIS — M5441 Lumbago with sciatica, right side: Secondary | ICD-10-CM | POA: Diagnosis not present

## 2017-12-18 DIAGNOSIS — E663 Overweight: Secondary | ICD-10-CM | POA: Diagnosis not present

## 2017-12-18 DIAGNOSIS — Z79899 Other long term (current) drug therapy: Secondary | ICD-10-CM | POA: Diagnosis not present

## 2017-12-18 DIAGNOSIS — M5442 Lumbago with sciatica, left side: Secondary | ICD-10-CM | POA: Diagnosis not present

## 2017-12-18 DIAGNOSIS — Z7982 Long term (current) use of aspirin: Secondary | ICD-10-CM | POA: Diagnosis not present

## 2017-12-18 DIAGNOSIS — Z888 Allergy status to other drugs, medicaments and biological substances status: Secondary | ICD-10-CM | POA: Diagnosis not present

## 2017-12-18 DIAGNOSIS — Z9889 Other specified postprocedural states: Secondary | ICD-10-CM | POA: Diagnosis not present

## 2017-12-18 DIAGNOSIS — E7800 Pure hypercholesterolemia, unspecified: Secondary | ICD-10-CM | POA: Diagnosis not present

## 2017-12-18 DIAGNOSIS — D3502 Benign neoplasm of left adrenal gland: Secondary | ICD-10-CM | POA: Diagnosis not present

## 2017-12-18 DIAGNOSIS — R110 Nausea: Secondary | ICD-10-CM | POA: Diagnosis not present

## 2017-12-18 DIAGNOSIS — Z955 Presence of coronary angioplasty implant and graft: Secondary | ICD-10-CM | POA: Diagnosis not present

## 2017-12-18 DIAGNOSIS — M47816 Spondylosis without myelopathy or radiculopathy, lumbar region: Secondary | ICD-10-CM | POA: Diagnosis not present

## 2017-12-18 DIAGNOSIS — M48061 Spinal stenosis, lumbar region without neurogenic claudication: Secondary | ICD-10-CM | POA: Diagnosis not present

## 2017-12-18 DIAGNOSIS — G8929 Other chronic pain: Secondary | ICD-10-CM | POA: Diagnosis not present

## 2017-12-18 DIAGNOSIS — I2510 Atherosclerotic heart disease of native coronary artery without angina pectoris: Secondary | ICD-10-CM | POA: Diagnosis not present

## 2017-12-18 DIAGNOSIS — I10 Essential (primary) hypertension: Secondary | ICD-10-CM | POA: Diagnosis not present

## 2017-12-18 DIAGNOSIS — I440 Atrioventricular block, first degree: Secondary | ICD-10-CM | POA: Diagnosis not present

## 2017-12-18 DIAGNOSIS — Z7984 Long term (current) use of oral hypoglycemic drugs: Secondary | ICD-10-CM | POA: Diagnosis not present

## 2017-12-18 DIAGNOSIS — Z6834 Body mass index (BMI) 34.0-34.9, adult: Secondary | ICD-10-CM | POA: Diagnosis not present

## 2017-12-18 DIAGNOSIS — M5489 Other dorsalgia: Secondary | ICD-10-CM | POA: Diagnosis not present

## 2017-12-20 DIAGNOSIS — M549 Dorsalgia, unspecified: Secondary | ICD-10-CM | POA: Diagnosis not present

## 2017-12-20 DIAGNOSIS — M48062 Spinal stenosis, lumbar region with neurogenic claudication: Secondary | ICD-10-CM | POA: Diagnosis not present

## 2017-12-20 DIAGNOSIS — M545 Low back pain: Secondary | ICD-10-CM | POA: Diagnosis not present

## 2017-12-20 DIAGNOSIS — G894 Chronic pain syndrome: Secondary | ICD-10-CM | POA: Diagnosis not present

## 2017-12-20 DIAGNOSIS — M961 Postlaminectomy syndrome, not elsewhere classified: Secondary | ICD-10-CM | POA: Diagnosis not present

## 2017-12-22 DIAGNOSIS — R030 Elevated blood-pressure reading, without diagnosis of hypertension: Secondary | ICD-10-CM | POA: Diagnosis not present

## 2017-12-22 DIAGNOSIS — M5489 Other dorsalgia: Secondary | ICD-10-CM | POA: Diagnosis not present

## 2018-01-02 DIAGNOSIS — M5134 Other intervertebral disc degeneration, thoracic region: Secondary | ICD-10-CM | POA: Diagnosis not present

## 2018-01-02 DIAGNOSIS — Z79899 Other long term (current) drug therapy: Secondary | ICD-10-CM | POA: Diagnosis not present

## 2018-01-02 DIAGNOSIS — M9982 Other biomechanical lesions of thoracic region: Secondary | ICD-10-CM | POA: Diagnosis not present

## 2018-01-02 DIAGNOSIS — M9983 Other biomechanical lesions of lumbar region: Secondary | ICD-10-CM | POA: Diagnosis not present

## 2018-01-02 DIAGNOSIS — M5136 Other intervertebral disc degeneration, lumbar region: Secondary | ICD-10-CM | POA: Diagnosis not present

## 2018-01-03 ENCOUNTER — Telehealth: Payer: Self-pay | Admitting: Interventional Cardiology

## 2018-01-03 NOTE — Telephone Encounter (Signed)
Spoke with pt and he states that he has been having swelling in both ankles and feet off and on for some time.  Pt states it usually occurs when he eats too much salt.  Not currently on Furosemide.  Pt hasnt been seen in almost 3 yrs.  Pt wants to see Dr. Tamala Julian, refused appt with PA/NP.  Advised pt to decrease salt in diet and elevate legs.  Scheduled pt to see Dr. Tamala Julian 2/25.  Pt appreciative for call.

## 2018-01-03 NOTE — Telephone Encounter (Signed)
Pt c/o swelling: STAT is pt has developed SOB within 24 hours  1) How much weight have you gained and in what time span?  No   2) If swelling, where is the swelling located? Both ankles and Feet   3) Are you currently taking a fluid pill? no  4) Are you currently SOB? NO  5) Do you have a log of your daily weights (if so, list)? No  6) Have you gained 3 pounds in a day or 5 pounds in a week? NO  7) Have you traveled recently? NO

## 2018-01-04 DIAGNOSIS — G8929 Other chronic pain: Secondary | ICD-10-CM | POA: Diagnosis not present

## 2018-01-04 DIAGNOSIS — M549 Dorsalgia, unspecified: Secondary | ICD-10-CM | POA: Diagnosis not present

## 2018-01-04 DIAGNOSIS — M5136 Other intervertebral disc degeneration, lumbar region: Secondary | ICD-10-CM | POA: Diagnosis not present

## 2018-01-04 DIAGNOSIS — M5134 Other intervertebral disc degeneration, thoracic region: Secondary | ICD-10-CM | POA: Diagnosis not present

## 2018-01-15 ENCOUNTER — Ambulatory Visit: Payer: Medicare Other | Admitting: Interventional Cardiology

## 2018-01-16 DIAGNOSIS — I10 Essential (primary) hypertension: Secondary | ICD-10-CM | POA: Diagnosis not present

## 2018-01-16 DIAGNOSIS — R531 Weakness: Secondary | ICD-10-CM | POA: Diagnosis not present

## 2018-01-16 DIAGNOSIS — R404 Transient alteration of awareness: Secondary | ICD-10-CM | POA: Diagnosis not present

## 2018-01-16 DIAGNOSIS — R4182 Altered mental status, unspecified: Secondary | ICD-10-CM | POA: Diagnosis not present

## 2018-01-30 DIAGNOSIS — Z79899 Other long term (current) drug therapy: Secondary | ICD-10-CM | POA: Diagnosis not present

## 2018-01-30 DIAGNOSIS — M5136 Other intervertebral disc degeneration, lumbar region: Secondary | ICD-10-CM | POA: Diagnosis not present

## 2018-01-30 DIAGNOSIS — M5134 Other intervertebral disc degeneration, thoracic region: Secondary | ICD-10-CM | POA: Diagnosis not present

## 2018-01-30 DIAGNOSIS — M9982 Other biomechanical lesions of thoracic region: Secondary | ICD-10-CM | POA: Diagnosis not present

## 2018-01-30 DIAGNOSIS — M9983 Other biomechanical lesions of lumbar region: Secondary | ICD-10-CM | POA: Diagnosis not present

## 2018-02-06 DIAGNOSIS — M5134 Other intervertebral disc degeneration, thoracic region: Secondary | ICD-10-CM | POA: Diagnosis not present

## 2018-02-06 DIAGNOSIS — M9983 Other biomechanical lesions of lumbar region: Secondary | ICD-10-CM | POA: Diagnosis not present

## 2018-02-06 DIAGNOSIS — M5136 Other intervertebral disc degeneration, lumbar region: Secondary | ICD-10-CM | POA: Diagnosis not present

## 2018-02-06 DIAGNOSIS — M9982 Other biomechanical lesions of thoracic region: Secondary | ICD-10-CM | POA: Diagnosis not present

## 2018-02-12 DIAGNOSIS — R7301 Impaired fasting glucose: Secondary | ICD-10-CM | POA: Diagnosis not present

## 2018-02-12 DIAGNOSIS — I10 Essential (primary) hypertension: Secondary | ICD-10-CM | POA: Diagnosis not present

## 2018-02-12 DIAGNOSIS — M545 Low back pain: Secondary | ICD-10-CM | POA: Diagnosis not present

## 2018-02-14 DIAGNOSIS — M48062 Spinal stenosis, lumbar region with neurogenic claudication: Secondary | ICD-10-CM | POA: Diagnosis not present

## 2018-02-14 DIAGNOSIS — G894 Chronic pain syndrome: Secondary | ICD-10-CM | POA: Diagnosis not present

## 2018-02-14 DIAGNOSIS — M961 Postlaminectomy syndrome, not elsewhere classified: Secondary | ICD-10-CM | POA: Diagnosis not present

## 2018-02-14 DIAGNOSIS — M5106 Intervertebral disc disorders with myelopathy, lumbar region: Secondary | ICD-10-CM | POA: Diagnosis not present

## 2018-02-20 DIAGNOSIS — R7301 Impaired fasting glucose: Secondary | ICD-10-CM | POA: Diagnosis not present

## 2018-02-20 DIAGNOSIS — D510 Vitamin B12 deficiency anemia due to intrinsic factor deficiency: Secondary | ICD-10-CM | POA: Diagnosis not present

## 2018-02-20 DIAGNOSIS — F419 Anxiety disorder, unspecified: Secondary | ICD-10-CM | POA: Diagnosis not present

## 2018-02-20 DIAGNOSIS — I10 Essential (primary) hypertension: Secondary | ICD-10-CM | POA: Diagnosis not present

## 2018-02-28 DIAGNOSIS — E785 Hyperlipidemia, unspecified: Secondary | ICD-10-CM | POA: Diagnosis not present

## 2018-02-28 DIAGNOSIS — Z79899 Other long term (current) drug therapy: Secondary | ICD-10-CM | POA: Diagnosis not present

## 2018-02-28 DIAGNOSIS — I10 Essential (primary) hypertension: Secondary | ICD-10-CM | POA: Diagnosis not present

## 2018-02-28 DIAGNOSIS — Z87891 Personal history of nicotine dependence: Secondary | ICD-10-CM | POA: Diagnosis not present

## 2018-02-28 DIAGNOSIS — S5001XA Contusion of right elbow, initial encounter: Secondary | ICD-10-CM | POA: Diagnosis not present

## 2018-02-28 DIAGNOSIS — S0101XA Laceration without foreign body of scalp, initial encounter: Secondary | ICD-10-CM | POA: Diagnosis not present

## 2018-02-28 DIAGNOSIS — Z791 Long term (current) use of non-steroidal anti-inflammatories (NSAID): Secondary | ICD-10-CM | POA: Diagnosis not present

## 2018-02-28 DIAGNOSIS — R404 Transient alteration of awareness: Secondary | ICD-10-CM | POA: Diagnosis not present

## 2018-02-28 DIAGNOSIS — Z969 Presence of functional implant, unspecified: Secondary | ICD-10-CM | POA: Diagnosis not present

## 2018-02-28 DIAGNOSIS — I2510 Atherosclerotic heart disease of native coronary artery without angina pectoris: Secondary | ICD-10-CM | POA: Diagnosis not present

## 2018-02-28 DIAGNOSIS — R42 Dizziness and giddiness: Secondary | ICD-10-CM | POA: Diagnosis not present

## 2018-02-28 DIAGNOSIS — S59901A Unspecified injury of right elbow, initial encounter: Secondary | ICD-10-CM | POA: Diagnosis not present

## 2018-02-28 DIAGNOSIS — E119 Type 2 diabetes mellitus without complications: Secondary | ICD-10-CM | POA: Diagnosis not present

## 2018-02-28 DIAGNOSIS — K219 Gastro-esophageal reflux disease without esophagitis: Secondary | ICD-10-CM | POA: Diagnosis not present

## 2018-02-28 DIAGNOSIS — E669 Obesity, unspecified: Secondary | ICD-10-CM | POA: Diagnosis not present

## 2018-02-28 DIAGNOSIS — Z888 Allergy status to other drugs, medicaments and biological substances status: Secondary | ICD-10-CM | POA: Diagnosis not present

## 2018-02-28 DIAGNOSIS — S0990XA Unspecified injury of head, initial encounter: Secondary | ICD-10-CM | POA: Diagnosis not present

## 2018-02-28 DIAGNOSIS — Z6834 Body mass index (BMI) 34.0-34.9, adult: Secondary | ICD-10-CM | POA: Diagnosis not present

## 2018-02-28 DIAGNOSIS — R51 Headache: Secondary | ICD-10-CM | POA: Diagnosis not present

## 2018-02-28 DIAGNOSIS — S5011XD Contusion of right forearm, subsequent encounter: Secondary | ICD-10-CM | POA: Diagnosis not present

## 2018-02-28 DIAGNOSIS — Z79891 Long term (current) use of opiate analgesic: Secondary | ICD-10-CM | POA: Diagnosis not present

## 2018-03-01 DIAGNOSIS — S59901A Unspecified injury of right elbow, initial encounter: Secondary | ICD-10-CM | POA: Diagnosis not present

## 2018-03-01 DIAGNOSIS — S5001XA Contusion of right elbow, initial encounter: Secondary | ICD-10-CM | POA: Diagnosis not present

## 2018-03-01 DIAGNOSIS — S0990XA Unspecified injury of head, initial encounter: Secondary | ICD-10-CM | POA: Diagnosis not present

## 2018-03-01 DIAGNOSIS — S0101XA Laceration without foreign body of scalp, initial encounter: Secondary | ICD-10-CM | POA: Diagnosis not present

## 2018-03-06 DIAGNOSIS — G8929 Other chronic pain: Secondary | ICD-10-CM | POA: Diagnosis not present

## 2018-03-06 DIAGNOSIS — M5136 Other intervertebral disc degeneration, lumbar region: Secondary | ICD-10-CM | POA: Diagnosis not present

## 2018-03-06 DIAGNOSIS — M5134 Other intervertebral disc degeneration, thoracic region: Secondary | ICD-10-CM | POA: Diagnosis not present

## 2018-03-06 DIAGNOSIS — M545 Low back pain: Secondary | ICD-10-CM | POA: Diagnosis not present

## 2018-03-06 DIAGNOSIS — Z79899 Other long term (current) drug therapy: Secondary | ICD-10-CM | POA: Diagnosis not present

## 2018-03-22 ENCOUNTER — Encounter: Payer: Self-pay | Admitting: Interventional Cardiology

## 2018-03-22 ENCOUNTER — Ambulatory Visit (INDEPENDENT_AMBULATORY_CARE_PROVIDER_SITE_OTHER): Payer: Medicare Other | Admitting: Interventional Cardiology

## 2018-03-22 VITALS — BP 108/56 | HR 101 | Ht 71.0 in | Wt 256.0 lb

## 2018-03-22 DIAGNOSIS — I2510 Atherosclerotic heart disease of native coronary artery without angina pectoris: Secondary | ICD-10-CM

## 2018-03-22 DIAGNOSIS — I10 Essential (primary) hypertension: Secondary | ICD-10-CM | POA: Diagnosis not present

## 2018-03-22 DIAGNOSIS — M48061 Spinal stenosis, lumbar region without neurogenic claudication: Secondary | ICD-10-CM | POA: Diagnosis not present

## 2018-03-22 DIAGNOSIS — G4733 Obstructive sleep apnea (adult) (pediatric): Secondary | ICD-10-CM

## 2018-03-22 NOTE — Patient Instructions (Signed)
Medication Instructions:  Your physician recommends that you continue on your current medications as directed. Please refer to the Current Medication list given to you today.  Labwork: None  Testing/Procedures: None  Follow-Up: Your physician recommends that you schedule a follow-up appointment as needed with Dr. Smith.     Any Other Special Instructions Will Be Listed Below (If Applicable).     If you need a refill on your cardiac medications before your next appointment, please call your pharmacy.   

## 2018-03-22 NOTE — Progress Notes (Signed)
Cardiology Office Note    Date:  03/22/2018   ID:  Jorge Sherman, DOB January 23, 1941, MRN [id number]  PCP:  Merrilee Seashore, MD  Cardiologist: Sinclair Grooms, MD   Chief Complaint  Patient presents with  . Congestive Heart Failure    History of Present Illness:  Jorge Sherman is a 77 y.o. male who presents for coronary artery disease with prior LAD stents (2009 bare-metal stent RCA 2009) and essential hypertension. Also known to have obstructive sleep apnea. Chronic pain syndrome related to thoracic and lumbar disc disease   This appointment was for the patient to be evaluated in February.  At that time he was complaining of swelling and shortness of breath.  He is having difficulty with back discomfort.  He has an upcoming lumbar surgery.  Says he is not here for clearance.  This is the appointment that was arranged from the request made in February.  He denies angina.  He is able to lie flat.  He is having no chest pain.  He does not feel that there are any cardiac issues going on at this time.   Past Medical History:  Diagnosis Date  . Chronic pain   . Depressive disorder, not elsewhere classified   . Dizziness and giddiness   . Essential and other specified forms of tremor   . Headache(784.0)   . Leukocytosis, unspecified   . Obstructive sleep apnea (adult) (pediatric)   . Pain in joint, lower leg   . Spinal stenosis, lumbar region, without neurogenic claudication   . Thoracic or lumbosacral neuritis or radiculitis, unspecified   . Type II or unspecified type diabetes mellitus without mention of complication, not stated as uncontrolled   . Unspecified cardiovascular disease   . Unspecified essential hypertension     Past Surgical History:  Procedure Laterality Date  . BACK SURGERY    . back surgey     times 3    Current Medications: Outpatient Medications Prior to Visit  Medication Sig Dispense Refill  . cloNIDine (CATAPRES) 0.1 MG tablet Take 0.1 mg by  mouth daily as needed. Use as directed  0  . DULoxetine (CYMBALTA) 30 MG capsule Take 90 mg by mouth daily.    . furosemide (LASIX) 20 MG tablet Take 20 mg by mouth daily as needed (swelling).     . gabapentin (NEURONTIN) 800 MG tablet Take 800 mg by mouth 4 (four) times daily.    . Iron-FA-B Cmp-C-Biot-Probiotic (FUSION PLUS) CAPS Take 1 tablet by mouth daily.  1  . linaclotide (LINZESS) 145 MCG CAPS capsule Take 1 capsule by mouth daily as needed for constipation.    [name] lisinopril (PRINIVIL,ZESTRIL) 40 MG tablet Take 40 mg by mouth daily.    [name] LORazepam (ATIVAN) 1 MG tablet Take 1 mg by mouth 3 (three) times daily as needed for anxiety.     . metaxalone (SKELAXIN) 800 MG tablet Take 800 mg by mouth 3 (three) times daily.    . methadone (DOLOPHINE) 10 MG tablet Take 10 mg by mouth 3 (three) times daily.    [name] aspirin 325 MG tablet Take 325 mg by mouth daily.    . celecoxib (CELEBREX) 200 MG capsule Take 200 mg by mouth 2 (two) times daily.    . diazepam (VALIUM) 10 MG tablet Take 10 mg by mouth every 12 (twelve) hours as needed for anxiety or sleep.     [name] ezetimibe (ZETIA) 10 MG tablet Take 10 mg by mouth daily.    [name]  hydroxychloroquine (PLAQUENIL) 200 MG tablet Take 200 mg by mouth daily.  3  . lidocaine (LIDODERM) 5 % Place 3 patches onto the skin daily. Remove & Discard patch within 12 hours or as directed by MD    . methocarbamol (ROBAXIN) 500 MG tablet Take 500 mg by mouth every 8 (eight) hours as needed for muscle spasms.     . multivitamin (METANX) 3-35-2 MG TABS tablet Take 1 tablet by mouth 2 (two) times daily.    [name] oxyCODONE (OXYCONTIN) 80 MG 12 hr tablet Take 80 mg by mouth every 12 (twelve) hours.    . OxyCODONE (OXYCONTIN) 80 mg T12A 12 hr tablet Take 80 mg by mouth every 12 (twelve) hours.    [name] oxyCODONE (ROXICODONE) 15 MG immediate release tablet Take 15 mg by mouth every 6 (six) hours as needed for pain.    . OXYCODONE HCL PO Take 15 mg by mouth. Two tabs twice daily    .  oxyCODONE-acetaminophen (PERCOCET/ROXICET) 5-325 MG per tablet Take 1 tablet by mouth every 6 (six) hours as needed for severe pain. (Patient not taking: Reported on 10/12/2015) 15 tablet 0  . rosuvastatin (CRESTOR) 20 MG tablet Take 20 mg by mouth daily.    [name] senna (SENOKOT) 8.6 MG TABS Take 1 tablet by mouth 4 (four) times daily.     No facility-administered medications prior to visit.      Allergies:   Iohexol and Iodine   Social History   Socioeconomic History  . Marital status: Legally Separated    Spouse name: Kennyth Sherman  . Number of children: 4  . Years of education: Not on file  . Highest education level: Not on file  Occupational History    Employer: DISABLED  Social Needs  . Financial resource strain: Not on file  . Food insecurity:    Worry: Not on file    Inability: Not on file  . Transportation needs:    Medical: Not on file    Non-medical: Not on file  Tobacco Use  . Smoking status: Unknown If Ever Smoked  Substance and Sexual Activity  . Alcohol use: No  . Drug use: No  . Sexual activity: Not on file  Lifestyle  . Physical activity:    Days per week: Not on file    Minutes per session: Not on file  . Stress: Not on file  Relationships  . Social connections:    Talks on phone: Not on file    Gets together: Not on file    Attends religious service: Not on file    Active member of club or organization: Not on file    Attends meetings of clubs or organizations: Not on file    Relationship status: Not on file  Other Topics Concern  . Not on file  Social History Narrative   ** Merged History Encounter **       Patient lives at home with wife.    Patient has 4 children.           Family History:  The patient's family history includes Cancer in his father; Diabetes in his mother; Heart disease in his father; Stroke in his mother.   ROS:   Please see the history of present illness.    Decreased memory.  Difficulty with back pain.  Denies orthopnea.   Right leg pain with ambulation.  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 All other systems reviewed and are negative.  PHYSICAL EXAM:   VS:  BP (!) 108/56   Pulse (!) 101   Ht 5\' 11"  (1.803 m)   Wt 256 lb (116.1 kg)   BMI 35.70 kg/m    GEN: Well nourished, well developed, in no acute distress  HEENT: normal  Neck: no JVD, carotid bruits, or masses Cardiac: RRR; 1/6 to 2/6 systolic murmur, rubs, or gallops,no edema . Respiratory:  clear to auscultation bilaterally, normal work of breathing GI: soft, nontender, nondistended, + BS MS: no deformity or atrophy  Skin: warm and dry, no rash Neuro:  Alert and Oriented x 3, Strength and sensation are intact Psych: euthymic mood, full affect  Wt Readings from Last 3 Encounters:  03/22/18 256 lb (116.1 kg)  03/02/15 258 lb 12.8 oz (117.4 kg)  10/30/13 242 lb (109.8 kg)      Studies/Labs Reviewed:   EKG:  EKG  Left axis deviation, sinus rhythm/sinus tachycardia, and no change compared to prior tracings from 2016 with slight increase in heart rate.  Recent Labs: No results found for requested labs within last 8760 hours.   Lipid Panel No results found for: CHOL, TRIG, HDL, CHOLHDL, VLDL, LDLCALC, LDLDIRECT  Additional studies/ records that were reviewed today include:  None    ASSESSMENT:    1. Atherosclerosis of native coronary artery of native heart without angina pectoris   2. Essential hypertension, benign   3. Obstructive sleep apnea   4. Spinal stenosis, lumbar region, without neurogenic claudication      PLAN:  In order of problems listed above:  1. Patient is doing well.  He has known history of bare-metal stents to both LAD and right coronary in 2009.  He is asymptomatic with reference to angina.  He denies dyspnea, orthopnea, and chest pain.  No specific change in management strategy at this time.  If he proceeds to lumbar disc surgery, he is cleared without any  specific work-up or change in current regimen. 2. Blood pressures under excellent control on current medical regimen. 3. Encouraged the patient to use CPAP apparatus. 4. Cleared for upcoming lumbar disc surgery unless develops interval change in clinical condition.  Clinical follow-up as needed in the future.  Encouraged aerobic activity and decreased carbohydrate and diet  Medication Adjustments/Labs and Tests Ordered: Current medicines are reviewed at length with the patient today.  Concerns regarding medicines are outlined above.  Medication changes, Labs and Tests ordered today are listed in the Patient Instructions below. Patient Instructions  Medication Instructions:  Your physician recommends that you continue on your current medications as directed. Please refer to the Current Medication list given to you today.  Labwork: None  Testing/Procedures: None  Follow-Up: Your physician recommends that you schedule a follow-up appointment as needed with Dr. Tamala Julian.     Any Other Special Instructions Will Be Listed Below (If Applicable).     If you need a refill on your cardiac medications before your next appointment, please call your pharmacy.      Signed, Sinclair Grooms, MD  03/22/2018 5:23 PM    [address], [place], [place]  [address] Phone: [phone]; Fax: [phone]

## 2018-03-23 ENCOUNTER — Telehealth: Payer: Self-pay

## 2018-03-23 DIAGNOSIS — M545 Low back pain: Secondary | ICD-10-CM | POA: Diagnosis not present

## 2018-03-23 DIAGNOSIS — M5106 Intervertebral disc disorders with myelopathy, lumbar region: Secondary | ICD-10-CM | POA: Diagnosis not present

## 2018-03-23 DIAGNOSIS — M48062 Spinal stenosis, lumbar region with neurogenic claudication: Secondary | ICD-10-CM | POA: Diagnosis not present

## 2018-03-23 DIAGNOSIS — Z4689 Encounter for fitting and adjustment of other specified devices: Secondary | ICD-10-CM | POA: Diagnosis not present

## 2018-03-23 DIAGNOSIS — M4716 Other spondylosis with myelopathy, lumbar region: Secondary | ICD-10-CM | POA: Diagnosis not present

## 2018-03-23 NOTE — Telephone Encounter (Signed)
   [facility] Pre-operative Risk Assessment    Request for surgical clearance:  1. What type of surgery is being performed? None listed    2. When is this surgery scheduled? 04/10/18   3. What type of clearance is required (medical clearance vs. Pharmacy clearance to hold med vs. Both)? Both  4. Are there any medications that need to be held prior to surgery and how long? Aspirin 81 mg x 7 days   5. Practice name and name of physician performing surgery? [facility]   6. What is your office phone number [phone]    7.   What is your office fax number [phone] attn: Joy  8.   Anesthesia type (None, local, MAC, general) ? None listed   Jorge Sherman 03/23/2018, 3:47 PM  _________________________________________________________________   (provider comments below)

## 2018-03-25 DIAGNOSIS — I2510 Atherosclerotic heart disease of native coronary artery without angina pectoris: Secondary | ICD-10-CM | POA: Diagnosis not present

## 2018-03-25 DIAGNOSIS — J9809 Other diseases of bronchus, not elsewhere classified: Secondary | ICD-10-CM | POA: Diagnosis not present

## 2018-03-25 DIAGNOSIS — R05 Cough: Secondary | ICD-10-CM | POA: Diagnosis not present

## 2018-03-25 DIAGNOSIS — J9601 Acute respiratory failure with hypoxia: Secondary | ICD-10-CM | POA: Diagnosis not present

## 2018-03-25 DIAGNOSIS — J168 Pneumonia due to other specified infectious organisms: Secondary | ICD-10-CM | POA: Diagnosis not present

## 2018-03-25 DIAGNOSIS — G8929 Other chronic pain: Secondary | ICD-10-CM | POA: Diagnosis not present

## 2018-03-25 DIAGNOSIS — G9341 Metabolic encephalopathy: Secondary | ICD-10-CM | POA: Diagnosis not present

## 2018-03-25 DIAGNOSIS — G4733 Obstructive sleep apnea (adult) (pediatric): Secondary | ICD-10-CM | POA: Diagnosis not present

## 2018-03-25 DIAGNOSIS — E119 Type 2 diabetes mellitus without complications: Secondary | ICD-10-CM | POA: Diagnosis not present

## 2018-03-25 DIAGNOSIS — R410 Disorientation, unspecified: Secondary | ICD-10-CM | POA: Diagnosis not present

## 2018-03-25 DIAGNOSIS — I10 Essential (primary) hypertension: Secondary | ICD-10-CM | POA: Diagnosis not present

## 2018-03-25 DIAGNOSIS — R918 Other nonspecific abnormal finding of lung field: Secondary | ICD-10-CM | POA: Diagnosis not present

## 2018-03-25 DIAGNOSIS — R000 Tachycardia, unspecified: Secondary | ICD-10-CM | POA: Diagnosis not present

## 2018-03-25 DIAGNOSIS — R0902 Hypoxemia: Secondary | ICD-10-CM | POA: Diagnosis not present

## 2018-03-26 DIAGNOSIS — J9601 Acute respiratory failure with hypoxia: Secondary | ICD-10-CM | POA: Diagnosis not present

## 2018-03-26 DIAGNOSIS — R000 Tachycardia, unspecified: Secondary | ICD-10-CM | POA: Diagnosis not present

## 2018-03-26 DIAGNOSIS — I10 Essential (primary) hypertension: Secondary | ICD-10-CM | POA: Diagnosis not present

## 2018-03-26 DIAGNOSIS — E119 Type 2 diabetes mellitus without complications: Secondary | ICD-10-CM | POA: Diagnosis not present

## 2018-03-26 DIAGNOSIS — G8929 Other chronic pain: Secondary | ICD-10-CM | POA: Diagnosis not present

## 2018-03-26 DIAGNOSIS — G9341 Metabolic encephalopathy: Secondary | ICD-10-CM | POA: Diagnosis not present

## 2018-03-26 DIAGNOSIS — I2510 Atherosclerotic heart disease of native coronary artery without angina pectoris: Secondary | ICD-10-CM | POA: Diagnosis not present

## 2018-03-26 DIAGNOSIS — G4733 Obstructive sleep apnea (adult) (pediatric): Secondary | ICD-10-CM | POA: Diagnosis not present

## 2018-03-26 NOTE — Telephone Encounter (Signed)
Please get type of surgery and anesthesia.

## 2018-03-27 DIAGNOSIS — Z515 Encounter for palliative care: Secondary | ICD-10-CM | POA: Diagnosis not present

## 2018-03-27 DIAGNOSIS — G894 Chronic pain syndrome: Secondary | ICD-10-CM | POA: Diagnosis not present

## 2018-03-27 DIAGNOSIS — G8929 Other chronic pain: Secondary | ICD-10-CM | POA: Diagnosis not present

## 2018-03-27 DIAGNOSIS — G9341 Metabolic encephalopathy: Secondary | ICD-10-CM | POA: Diagnosis not present

## 2018-03-27 DIAGNOSIS — Z7189 Other specified counseling: Secondary | ICD-10-CM | POA: Diagnosis not present

## 2018-03-27 DIAGNOSIS — F419 Anxiety disorder, unspecified: Secondary | ICD-10-CM | POA: Diagnosis not present

## 2018-03-27 DIAGNOSIS — J9601 Acute respiratory failure with hypoxia: Secondary | ICD-10-CM | POA: Diagnosis not present

## 2018-03-27 DIAGNOSIS — E119 Type 2 diabetes mellitus without complications: Secondary | ICD-10-CM | POA: Diagnosis not present

## 2018-03-27 DIAGNOSIS — G4733 Obstructive sleep apnea (adult) (pediatric): Secondary | ICD-10-CM | POA: Diagnosis not present

## 2018-03-27 DIAGNOSIS — I2510 Atherosclerotic heart disease of native coronary artery without angina pectoris: Secondary | ICD-10-CM | POA: Diagnosis not present

## 2018-03-27 DIAGNOSIS — R109 Unspecified abdominal pain: Secondary | ICD-10-CM | POA: Diagnosis not present

## 2018-03-27 NOTE — Telephone Encounter (Signed)
   Primary Cardiologist: Sinclair Grooms, MD  Chart reviewed as part of pre-operative protocol coverage. Given past medical history and time since last visit, based on ACC/AHA guidelines, Jorge Sherman would be at acceptable risk for the planned procedure without further cardiovascular testing.  Has been seen and evaluated by Dr. Tamala Julian.  It is ok to hold ASA for 7 days for procedure.     I will route this recommendation to the requesting party via Epic fax function and remove from pre-op pool.  Please call with questions.  Cecilie Kicks, NP 03/27/2018, 4:19 PM

## 2018-03-27 NOTE — Telephone Encounter (Signed)
S/w with requesting provider and she states that pt is having a Laminectomy and fusion, removal of hardware and exploration of fusion, allograft  Anesthesia-  L2-S1 revision

## 2018-03-28 DIAGNOSIS — I2510 Atherosclerotic heart disease of native coronary artery without angina pectoris: Secondary | ICD-10-CM | POA: Diagnosis not present

## 2018-03-28 DIAGNOSIS — G894 Chronic pain syndrome: Secondary | ICD-10-CM | POA: Diagnosis not present

## 2018-03-28 DIAGNOSIS — G9341 Metabolic encephalopathy: Secondary | ICD-10-CM | POA: Diagnosis not present

## 2018-03-28 DIAGNOSIS — E119 Type 2 diabetes mellitus without complications: Secondary | ICD-10-CM | POA: Diagnosis not present

## 2018-03-28 DIAGNOSIS — J9601 Acute respiratory failure with hypoxia: Secondary | ICD-10-CM | POA: Diagnosis not present

## 2018-03-29 DIAGNOSIS — I2510 Atherosclerotic heart disease of native coronary artery without angina pectoris: Secondary | ICD-10-CM | POA: Diagnosis not present

## 2018-03-29 DIAGNOSIS — R1033 Periumbilical pain: Secondary | ICD-10-CM | POA: Diagnosis not present

## 2018-03-29 DIAGNOSIS — G4733 Obstructive sleep apnea (adult) (pediatric): Secondary | ICD-10-CM | POA: Diagnosis not present

## 2018-03-29 DIAGNOSIS — G894 Chronic pain syndrome: Secondary | ICD-10-CM | POA: Diagnosis not present

## 2018-03-29 DIAGNOSIS — G8929 Other chronic pain: Secondary | ICD-10-CM | POA: Diagnosis not present

## 2018-03-29 DIAGNOSIS — R195 Other fecal abnormalities: Secondary | ICD-10-CM | POA: Diagnosis not present

## 2018-03-29 DIAGNOSIS — M7122 Synovial cyst of popliteal space [Baker], left knee: Secondary | ICD-10-CM | POA: Diagnosis not present

## 2018-03-29 DIAGNOSIS — R918 Other nonspecific abnormal finding of lung field: Secondary | ICD-10-CM | POA: Diagnosis not present

## 2018-03-29 DIAGNOSIS — E119 Type 2 diabetes mellitus without complications: Secondary | ICD-10-CM | POA: Diagnosis not present

## 2018-03-29 DIAGNOSIS — N2889 Other specified disorders of kidney and ureter: Secondary | ICD-10-CM | POA: Diagnosis not present

## 2018-03-29 DIAGNOSIS — Z7901 Long term (current) use of anticoagulants: Secondary | ICD-10-CM | POA: Diagnosis not present

## 2018-03-29 DIAGNOSIS — M7121 Synovial cyst of popliteal space [Baker], right knee: Secondary | ICD-10-CM | POA: Diagnosis not present

## 2018-03-29 DIAGNOSIS — J9601 Acute respiratory failure with hypoxia: Secondary | ICD-10-CM | POA: Diagnosis not present

## 2018-03-29 DIAGNOSIS — G9341 Metabolic encephalopathy: Secondary | ICD-10-CM | POA: Diagnosis not present

## 2018-03-29 DIAGNOSIS — Z7189 Other specified counseling: Secondary | ICD-10-CM | POA: Diagnosis not present

## 2018-03-29 DIAGNOSIS — K5730 Diverticulosis of large intestine without perforation or abscess without bleeding: Secondary | ICD-10-CM | POA: Diagnosis not present

## 2018-03-29 DIAGNOSIS — R05 Cough: Secondary | ICD-10-CM | POA: Diagnosis not present

## 2018-03-29 DIAGNOSIS — Z515 Encounter for palliative care: Secondary | ICD-10-CM | POA: Diagnosis not present

## 2018-03-29 DIAGNOSIS — K8020 Calculus of gallbladder without cholecystitis without obstruction: Secondary | ICD-10-CM | POA: Diagnosis not present

## 2018-03-29 DIAGNOSIS — R109 Unspecified abdominal pain: Secondary | ICD-10-CM | POA: Diagnosis not present

## 2018-03-29 DIAGNOSIS — J984 Other disorders of lung: Secondary | ICD-10-CM | POA: Diagnosis not present

## 2018-03-29 DIAGNOSIS — F419 Anxiety disorder, unspecified: Secondary | ICD-10-CM | POA: Diagnosis not present

## 2018-03-30 DIAGNOSIS — G4733 Obstructive sleep apnea (adult) (pediatric): Secondary | ICD-10-CM | POA: Diagnosis not present

## 2018-03-30 DIAGNOSIS — G8929 Other chronic pain: Secondary | ICD-10-CM | POA: Diagnosis not present

## 2018-03-30 DIAGNOSIS — J9601 Acute respiratory failure with hypoxia: Secondary | ICD-10-CM | POA: Diagnosis not present

## 2018-03-30 DIAGNOSIS — G9341 Metabolic encephalopathy: Secondary | ICD-10-CM | POA: Diagnosis not present

## 2018-03-30 DIAGNOSIS — R195 Other fecal abnormalities: Secondary | ICD-10-CM | POA: Diagnosis not present

## 2018-03-30 DIAGNOSIS — Z7189 Other specified counseling: Secondary | ICD-10-CM | POA: Diagnosis not present

## 2018-03-30 DIAGNOSIS — E119 Type 2 diabetes mellitus without complications: Secondary | ICD-10-CM | POA: Diagnosis not present

## 2018-03-30 DIAGNOSIS — R109 Unspecified abdominal pain: Secondary | ICD-10-CM | POA: Diagnosis not present

## 2018-03-30 DIAGNOSIS — G894 Chronic pain syndrome: Secondary | ICD-10-CM | POA: Diagnosis not present

## 2018-03-30 DIAGNOSIS — I2510 Atherosclerotic heart disease of native coronary artery without angina pectoris: Secondary | ICD-10-CM | POA: Diagnosis not present

## 2018-03-30 DIAGNOSIS — Z515 Encounter for palliative care: Secondary | ICD-10-CM | POA: Diagnosis not present

## 2018-03-30 DIAGNOSIS — F419 Anxiety disorder, unspecified: Secondary | ICD-10-CM | POA: Diagnosis not present

## 2018-03-30 DIAGNOSIS — R1033 Periumbilical pain: Secondary | ICD-10-CM | POA: Diagnosis not present

## 2018-03-30 DIAGNOSIS — Z7901 Long term (current) use of anticoagulants: Secondary | ICD-10-CM | POA: Diagnosis not present

## 2018-04-09 DIAGNOSIS — R7301 Impaired fasting glucose: Secondary | ICD-10-CM | POA: Diagnosis not present

## 2018-04-09 DIAGNOSIS — I10 Essential (primary) hypertension: Secondary | ICD-10-CM | POA: Diagnosis not present

## 2018-04-09 DIAGNOSIS — K219 Gastro-esophageal reflux disease without esophagitis: Secondary | ICD-10-CM | POA: Diagnosis not present

## 2018-04-09 DIAGNOSIS — F330 Major depressive disorder, recurrent, mild: Secondary | ICD-10-CM | POA: Diagnosis not present

## 2018-04-09 DIAGNOSIS — Z79899 Other long term (current) drug therapy: Secondary | ICD-10-CM | POA: Diagnosis not present

## 2018-04-18 DIAGNOSIS — K1370 Unspecified lesions of oral mucosa: Secondary | ICD-10-CM | POA: Diagnosis not present

## 2018-04-18 DIAGNOSIS — R05 Cough: Secondary | ICD-10-CM | POA: Diagnosis not present

## 2018-04-18 DIAGNOSIS — M545 Low back pain: Secondary | ICD-10-CM | POA: Diagnosis not present

## 2018-04-18 DIAGNOSIS — T7840XA Allergy, unspecified, initial encounter: Secondary | ICD-10-CM | POA: Diagnosis not present

## 2018-04-18 DIAGNOSIS — K120 Recurrent oral aphthae: Secondary | ICD-10-CM | POA: Diagnosis not present

## 2018-04-18 DIAGNOSIS — G8929 Other chronic pain: Secondary | ICD-10-CM | POA: Diagnosis not present

## 2018-04-18 DIAGNOSIS — M5440 Lumbago with sciatica, unspecified side: Secondary | ICD-10-CM | POA: Diagnosis not present

## 2018-04-18 DIAGNOSIS — M7989 Other specified soft tissue disorders: Secondary | ICD-10-CM | POA: Diagnosis not present

## 2018-04-19 DIAGNOSIS — Z79899 Other long term (current) drug therapy: Secondary | ICD-10-CM | POA: Diagnosis not present

## 2018-04-30 DIAGNOSIS — I10 Essential (primary) hypertension: Secondary | ICD-10-CM | POA: Diagnosis not present

## 2018-04-30 DIAGNOSIS — Z9181 History of falling: Secondary | ICD-10-CM | POA: Diagnosis not present

## 2018-04-30 DIAGNOSIS — Z6836 Body mass index (BMI) 36.0-36.9, adult: Secondary | ICD-10-CM | POA: Diagnosis not present

## 2018-04-30 DIAGNOSIS — F419 Anxiety disorder, unspecified: Secondary | ICD-10-CM | POA: Diagnosis not present

## 2018-04-30 DIAGNOSIS — F329 Major depressive disorder, single episode, unspecified: Secondary | ICD-10-CM | POA: Diagnosis not present

## 2018-04-30 DIAGNOSIS — N400 Enlarged prostate without lower urinary tract symptoms: Secondary | ICD-10-CM | POA: Diagnosis not present

## 2018-04-30 DIAGNOSIS — M549 Dorsalgia, unspecified: Secondary | ICD-10-CM | POA: Diagnosis not present

## 2018-04-30 DIAGNOSIS — K219 Gastro-esophageal reflux disease without esophagitis: Secondary | ICD-10-CM | POA: Diagnosis not present

## 2018-04-30 DIAGNOSIS — E119 Type 2 diabetes mellitus without complications: Secondary | ICD-10-CM | POA: Diagnosis not present

## 2018-04-30 DIAGNOSIS — M6281 Muscle weakness (generalized): Secondary | ICD-10-CM | POA: Diagnosis not present

## 2018-05-02 DIAGNOSIS — M5134 Other intervertebral disc degeneration, thoracic region: Secondary | ICD-10-CM | POA: Diagnosis not present

## 2018-05-02 DIAGNOSIS — M2550 Pain in unspecified joint: Secondary | ICD-10-CM | POA: Diagnosis not present

## 2018-05-02 DIAGNOSIS — M9982 Other biomechanical lesions of thoracic region: Secondary | ICD-10-CM | POA: Diagnosis not present

## 2018-05-02 DIAGNOSIS — M9983 Other biomechanical lesions of lumbar region: Secondary | ICD-10-CM | POA: Diagnosis not present

## 2018-05-03 DIAGNOSIS — F419 Anxiety disorder, unspecified: Secondary | ICD-10-CM | POA: Diagnosis not present

## 2018-05-03 DIAGNOSIS — H25042 Posterior subcapsular polar age-related cataract, left eye: Secondary | ICD-10-CM | POA: Diagnosis not present

## 2018-05-03 DIAGNOSIS — H04123 Dry eye syndrome of bilateral lacrimal glands: Secondary | ICD-10-CM | POA: Diagnosis not present

## 2018-05-03 DIAGNOSIS — K219 Gastro-esophageal reflux disease without esophagitis: Secondary | ICD-10-CM | POA: Diagnosis not present

## 2018-05-03 DIAGNOSIS — I10 Essential (primary) hypertension: Secondary | ICD-10-CM | POA: Diagnosis not present

## 2018-05-03 DIAGNOSIS — E119 Type 2 diabetes mellitus without complications: Secondary | ICD-10-CM | POA: Diagnosis not present

## 2018-05-03 DIAGNOSIS — M6281 Muscle weakness (generalized): Secondary | ICD-10-CM | POA: Diagnosis not present

## 2018-05-03 DIAGNOSIS — F329 Major depressive disorder, single episode, unspecified: Secondary | ICD-10-CM | POA: Diagnosis not present

## 2018-05-03 DIAGNOSIS — H25041 Posterior subcapsular polar age-related cataract, right eye: Secondary | ICD-10-CM | POA: Diagnosis not present

## 2018-05-04 DIAGNOSIS — F419 Anxiety disorder, unspecified: Secondary | ICD-10-CM | POA: Diagnosis not present

## 2018-05-04 DIAGNOSIS — F329 Major depressive disorder, single episode, unspecified: Secondary | ICD-10-CM | POA: Diagnosis not present

## 2018-05-04 DIAGNOSIS — I10 Essential (primary) hypertension: Secondary | ICD-10-CM | POA: Diagnosis not present

## 2018-05-04 DIAGNOSIS — E119 Type 2 diabetes mellitus without complications: Secondary | ICD-10-CM | POA: Diagnosis not present

## 2018-05-04 DIAGNOSIS — M6281 Muscle weakness (generalized): Secondary | ICD-10-CM | POA: Diagnosis not present

## 2018-05-04 DIAGNOSIS — K219 Gastro-esophageal reflux disease without esophagitis: Secondary | ICD-10-CM | POA: Diagnosis not present

## 2018-05-07 DIAGNOSIS — M25562 Pain in left knee: Secondary | ICD-10-CM | POA: Diagnosis not present

## 2018-05-07 DIAGNOSIS — M25561 Pain in right knee: Secondary | ICD-10-CM | POA: Diagnosis not present

## 2018-05-07 DIAGNOSIS — I10 Essential (primary) hypertension: Secondary | ICD-10-CM | POA: Diagnosis not present

## 2018-05-07 DIAGNOSIS — K219 Gastro-esophageal reflux disease without esophagitis: Secondary | ICD-10-CM | POA: Diagnosis not present

## 2018-05-07 DIAGNOSIS — E119 Type 2 diabetes mellitus without complications: Secondary | ICD-10-CM | POA: Diagnosis not present

## 2018-05-07 DIAGNOSIS — M6281 Muscle weakness (generalized): Secondary | ICD-10-CM | POA: Diagnosis not present

## 2018-05-07 DIAGNOSIS — F419 Anxiety disorder, unspecified: Secondary | ICD-10-CM | POA: Diagnosis not present

## 2018-05-07 DIAGNOSIS — F329 Major depressive disorder, single episode, unspecified: Secondary | ICD-10-CM | POA: Diagnosis not present

## 2018-05-09 DIAGNOSIS — K219 Gastro-esophageal reflux disease without esophagitis: Secondary | ICD-10-CM | POA: Diagnosis not present

## 2018-05-09 DIAGNOSIS — F329 Major depressive disorder, single episode, unspecified: Secondary | ICD-10-CM | POA: Diagnosis not present

## 2018-05-09 DIAGNOSIS — I10 Essential (primary) hypertension: Secondary | ICD-10-CM | POA: Diagnosis not present

## 2018-05-09 DIAGNOSIS — M6281 Muscle weakness (generalized): Secondary | ICD-10-CM | POA: Diagnosis not present

## 2018-05-09 DIAGNOSIS — E119 Type 2 diabetes mellitus without complications: Secondary | ICD-10-CM | POA: Diagnosis not present

## 2018-05-09 DIAGNOSIS — F419 Anxiety disorder, unspecified: Secondary | ICD-10-CM | POA: Diagnosis not present

## 2018-05-10 DIAGNOSIS — E119 Type 2 diabetes mellitus without complications: Secondary | ICD-10-CM | POA: Diagnosis not present

## 2018-05-10 DIAGNOSIS — F419 Anxiety disorder, unspecified: Secondary | ICD-10-CM | POA: Diagnosis not present

## 2018-05-10 DIAGNOSIS — M6281 Muscle weakness (generalized): Secondary | ICD-10-CM | POA: Diagnosis not present

## 2018-05-10 DIAGNOSIS — I10 Essential (primary) hypertension: Secondary | ICD-10-CM | POA: Diagnosis not present

## 2018-05-10 DIAGNOSIS — F329 Major depressive disorder, single episode, unspecified: Secondary | ICD-10-CM | POA: Diagnosis not present

## 2018-05-10 DIAGNOSIS — K219 Gastro-esophageal reflux disease without esophagitis: Secondary | ICD-10-CM | POA: Diagnosis not present

## 2018-05-14 DIAGNOSIS — M6281 Muscle weakness (generalized): Secondary | ICD-10-CM | POA: Diagnosis not present

## 2018-05-14 DIAGNOSIS — I10 Essential (primary) hypertension: Secondary | ICD-10-CM | POA: Diagnosis not present

## 2018-05-14 DIAGNOSIS — K219 Gastro-esophageal reflux disease without esophagitis: Secondary | ICD-10-CM | POA: Diagnosis not present

## 2018-05-14 DIAGNOSIS — F419 Anxiety disorder, unspecified: Secondary | ICD-10-CM | POA: Diagnosis not present

## 2018-05-14 DIAGNOSIS — F329 Major depressive disorder, single episode, unspecified: Secondary | ICD-10-CM | POA: Diagnosis not present

## 2018-05-14 DIAGNOSIS — E119 Type 2 diabetes mellitus without complications: Secondary | ICD-10-CM | POA: Diagnosis not present

## 2018-05-15 DIAGNOSIS — E785 Hyperlipidemia, unspecified: Secondary | ICD-10-CM | POA: Diagnosis not present

## 2018-05-15 DIAGNOSIS — F329 Major depressive disorder, single episode, unspecified: Secondary | ICD-10-CM | POA: Diagnosis not present

## 2018-05-15 DIAGNOSIS — E669 Obesity, unspecified: Secondary | ICD-10-CM | POA: Diagnosis not present

## 2018-05-15 DIAGNOSIS — M6281 Muscle weakness (generalized): Secondary | ICD-10-CM | POA: Diagnosis not present

## 2018-05-15 DIAGNOSIS — Z9689 Presence of other specified functional implants: Secondary | ICD-10-CM | POA: Diagnosis not present

## 2018-05-15 DIAGNOSIS — Z955 Presence of coronary angioplasty implant and graft: Secondary | ICD-10-CM | POA: Diagnosis not present

## 2018-05-15 DIAGNOSIS — K219 Gastro-esophageal reflux disease without esophagitis: Secondary | ICD-10-CM | POA: Diagnosis not present

## 2018-05-15 DIAGNOSIS — I2510 Atherosclerotic heart disease of native coronary artery without angina pectoris: Secondary | ICD-10-CM | POA: Diagnosis not present

## 2018-05-15 DIAGNOSIS — Z87891 Personal history of nicotine dependence: Secondary | ICD-10-CM | POA: Diagnosis not present

## 2018-05-15 DIAGNOSIS — E119 Type 2 diabetes mellitus without complications: Secondary | ICD-10-CM | POA: Diagnosis not present

## 2018-05-15 DIAGNOSIS — I10 Essential (primary) hypertension: Secondary | ICD-10-CM | POA: Diagnosis not present

## 2018-05-15 DIAGNOSIS — H25042 Posterior subcapsular polar age-related cataract, left eye: Secondary | ICD-10-CM | POA: Diagnosis not present

## 2018-05-15 DIAGNOSIS — G9340 Encephalopathy, unspecified: Secondary | ICD-10-CM | POA: Diagnosis not present

## 2018-05-15 DIAGNOSIS — H25041 Posterior subcapsular polar age-related cataract, right eye: Secondary | ICD-10-CM | POA: Diagnosis not present

## 2018-05-15 DIAGNOSIS — G4733 Obstructive sleep apnea (adult) (pediatric): Secondary | ICD-10-CM | POA: Diagnosis not present

## 2018-05-15 DIAGNOSIS — H25043 Posterior subcapsular polar age-related cataract, bilateral: Secondary | ICD-10-CM | POA: Diagnosis not present

## 2018-05-15 DIAGNOSIS — J208 Acute bronchitis due to other specified organisms: Secondary | ICD-10-CM | POA: Diagnosis not present

## 2018-05-15 DIAGNOSIS — F419 Anxiety disorder, unspecified: Secondary | ICD-10-CM | POA: Diagnosis not present

## 2018-05-17 DIAGNOSIS — I10 Essential (primary) hypertension: Secondary | ICD-10-CM | POA: Diagnosis not present

## 2018-05-17 DIAGNOSIS — F419 Anxiety disorder, unspecified: Secondary | ICD-10-CM | POA: Diagnosis not present

## 2018-05-17 DIAGNOSIS — F329 Major depressive disorder, single episode, unspecified: Secondary | ICD-10-CM | POA: Diagnosis not present

## 2018-05-17 DIAGNOSIS — K219 Gastro-esophageal reflux disease without esophagitis: Secondary | ICD-10-CM | POA: Diagnosis not present

## 2018-05-17 DIAGNOSIS — E119 Type 2 diabetes mellitus without complications: Secondary | ICD-10-CM | POA: Diagnosis not present

## 2018-05-17 DIAGNOSIS — M6281 Muscle weakness (generalized): Secondary | ICD-10-CM | POA: Diagnosis not present

## 2018-05-18 DIAGNOSIS — E119 Type 2 diabetes mellitus without complications: Secondary | ICD-10-CM | POA: Diagnosis not present

## 2018-05-18 DIAGNOSIS — K219 Gastro-esophageal reflux disease without esophagitis: Secondary | ICD-10-CM | POA: Diagnosis not present

## 2018-05-18 DIAGNOSIS — F329 Major depressive disorder, single episode, unspecified: Secondary | ICD-10-CM | POA: Diagnosis not present

## 2018-05-18 DIAGNOSIS — I10 Essential (primary) hypertension: Secondary | ICD-10-CM | POA: Diagnosis not present

## 2018-05-18 DIAGNOSIS — M6281 Muscle weakness (generalized): Secondary | ICD-10-CM | POA: Diagnosis not present

## 2018-05-18 DIAGNOSIS — F419 Anxiety disorder, unspecified: Secondary | ICD-10-CM | POA: Diagnosis not present

## 2018-05-23 DIAGNOSIS — K219 Gastro-esophageal reflux disease without esophagitis: Secondary | ICD-10-CM | POA: Diagnosis not present

## 2018-05-23 DIAGNOSIS — F419 Anxiety disorder, unspecified: Secondary | ICD-10-CM | POA: Diagnosis not present

## 2018-05-23 DIAGNOSIS — F329 Major depressive disorder, single episode, unspecified: Secondary | ICD-10-CM | POA: Diagnosis not present

## 2018-05-23 DIAGNOSIS — M6281 Muscle weakness (generalized): Secondary | ICD-10-CM | POA: Diagnosis not present

## 2018-05-23 DIAGNOSIS — I10 Essential (primary) hypertension: Secondary | ICD-10-CM | POA: Diagnosis not present

## 2018-05-23 DIAGNOSIS — E119 Type 2 diabetes mellitus without complications: Secondary | ICD-10-CM | POA: Diagnosis not present

## 2018-05-25 DIAGNOSIS — M6281 Muscle weakness (generalized): Secondary | ICD-10-CM | POA: Diagnosis not present

## 2018-05-25 DIAGNOSIS — F419 Anxiety disorder, unspecified: Secondary | ICD-10-CM | POA: Diagnosis not present

## 2018-05-25 DIAGNOSIS — E119 Type 2 diabetes mellitus without complications: Secondary | ICD-10-CM | POA: Diagnosis not present

## 2018-05-25 DIAGNOSIS — F329 Major depressive disorder, single episode, unspecified: Secondary | ICD-10-CM | POA: Diagnosis not present

## 2018-05-25 DIAGNOSIS — I10 Essential (primary) hypertension: Secondary | ICD-10-CM | POA: Diagnosis not present

## 2018-05-25 DIAGNOSIS — K219 Gastro-esophageal reflux disease without esophagitis: Secondary | ICD-10-CM | POA: Diagnosis not present

## 2018-05-28 DIAGNOSIS — K219 Gastro-esophageal reflux disease without esophagitis: Secondary | ICD-10-CM | POA: Diagnosis not present

## 2018-05-28 DIAGNOSIS — F419 Anxiety disorder, unspecified: Secondary | ICD-10-CM | POA: Diagnosis not present

## 2018-05-28 DIAGNOSIS — I10 Essential (primary) hypertension: Secondary | ICD-10-CM | POA: Diagnosis not present

## 2018-05-28 DIAGNOSIS — M6281 Muscle weakness (generalized): Secondary | ICD-10-CM | POA: Diagnosis not present

## 2018-05-28 DIAGNOSIS — F329 Major depressive disorder, single episode, unspecified: Secondary | ICD-10-CM | POA: Diagnosis not present

## 2018-05-28 DIAGNOSIS — E119 Type 2 diabetes mellitus without complications: Secondary | ICD-10-CM | POA: Diagnosis not present

## 2018-05-30 DIAGNOSIS — F329 Major depressive disorder, single episode, unspecified: Secondary | ICD-10-CM | POA: Diagnosis not present

## 2018-05-30 DIAGNOSIS — K219 Gastro-esophageal reflux disease without esophagitis: Secondary | ICD-10-CM | POA: Diagnosis not present

## 2018-05-30 DIAGNOSIS — E119 Type 2 diabetes mellitus without complications: Secondary | ICD-10-CM | POA: Diagnosis not present

## 2018-05-30 DIAGNOSIS — F419 Anxiety disorder, unspecified: Secondary | ICD-10-CM | POA: Diagnosis not present

## 2018-05-30 DIAGNOSIS — I10 Essential (primary) hypertension: Secondary | ICD-10-CM | POA: Diagnosis not present

## 2018-05-30 DIAGNOSIS — M6281 Muscle weakness (generalized): Secondary | ICD-10-CM | POA: Diagnosis not present

## 2018-06-01 DIAGNOSIS — M5136 Other intervertebral disc degeneration, lumbar region: Secondary | ICD-10-CM | POA: Diagnosis not present

## 2018-06-01 DIAGNOSIS — K219 Gastro-esophageal reflux disease without esophagitis: Secondary | ICD-10-CM | POA: Diagnosis not present

## 2018-06-01 DIAGNOSIS — E785 Hyperlipidemia, unspecified: Secondary | ICD-10-CM | POA: Diagnosis not present

## 2018-06-01 DIAGNOSIS — M9983 Other biomechanical lesions of lumbar region: Secondary | ICD-10-CM | POA: Diagnosis not present

## 2018-06-01 DIAGNOSIS — Z79899 Other long term (current) drug therapy: Secondary | ICD-10-CM | POA: Diagnosis not present

## 2018-06-01 DIAGNOSIS — E119 Type 2 diabetes mellitus without complications: Secondary | ICD-10-CM | POA: Diagnosis not present

## 2018-06-01 DIAGNOSIS — M5134 Other intervertebral disc degeneration, thoracic region: Secondary | ICD-10-CM | POA: Diagnosis not present

## 2018-06-01 DIAGNOSIS — F329 Major depressive disorder, single episode, unspecified: Secondary | ICD-10-CM | POA: Diagnosis not present

## 2018-06-01 DIAGNOSIS — I10 Essential (primary) hypertension: Secondary | ICD-10-CM | POA: Diagnosis not present

## 2018-06-01 DIAGNOSIS — R7301 Impaired fasting glucose: Secondary | ICD-10-CM | POA: Diagnosis not present

## 2018-06-01 DIAGNOSIS — M9982 Other biomechanical lesions of thoracic region: Secondary | ICD-10-CM | POA: Diagnosis not present

## 2018-06-01 DIAGNOSIS — M6281 Muscle weakness (generalized): Secondary | ICD-10-CM | POA: Diagnosis not present

## 2018-06-01 DIAGNOSIS — F419 Anxiety disorder, unspecified: Secondary | ICD-10-CM | POA: Diagnosis not present

## 2018-06-04 DIAGNOSIS — I10 Essential (primary) hypertension: Secondary | ICD-10-CM | POA: Diagnosis not present

## 2018-06-04 DIAGNOSIS — M6281 Muscle weakness (generalized): Secondary | ICD-10-CM | POA: Diagnosis not present

## 2018-06-04 DIAGNOSIS — K219 Gastro-esophageal reflux disease without esophagitis: Secondary | ICD-10-CM | POA: Diagnosis not present

## 2018-06-04 DIAGNOSIS — F329 Major depressive disorder, single episode, unspecified: Secondary | ICD-10-CM | POA: Diagnosis not present

## 2018-06-04 DIAGNOSIS — F419 Anxiety disorder, unspecified: Secondary | ICD-10-CM | POA: Diagnosis not present

## 2018-06-04 DIAGNOSIS — E119 Type 2 diabetes mellitus without complications: Secondary | ICD-10-CM | POA: Diagnosis not present

## 2018-06-07 DIAGNOSIS — I10 Essential (primary) hypertension: Secondary | ICD-10-CM | POA: Diagnosis not present

## 2018-06-07 DIAGNOSIS — M6281 Muscle weakness (generalized): Secondary | ICD-10-CM | POA: Diagnosis not present

## 2018-06-07 DIAGNOSIS — F419 Anxiety disorder, unspecified: Secondary | ICD-10-CM | POA: Diagnosis not present

## 2018-06-07 DIAGNOSIS — F329 Major depressive disorder, single episode, unspecified: Secondary | ICD-10-CM | POA: Diagnosis not present

## 2018-06-07 DIAGNOSIS — K219 Gastro-esophageal reflux disease without esophagitis: Secondary | ICD-10-CM | POA: Diagnosis not present

## 2018-06-07 DIAGNOSIS — E119 Type 2 diabetes mellitus without complications: Secondary | ICD-10-CM | POA: Diagnosis not present

## 2018-06-08 DIAGNOSIS — K219 Gastro-esophageal reflux disease without esophagitis: Secondary | ICD-10-CM | POA: Diagnosis not present

## 2018-06-08 DIAGNOSIS — E119 Type 2 diabetes mellitus without complications: Secondary | ICD-10-CM | POA: Diagnosis not present

## 2018-06-08 DIAGNOSIS — M6281 Muscle weakness (generalized): Secondary | ICD-10-CM | POA: Diagnosis not present

## 2018-06-08 DIAGNOSIS — I10 Essential (primary) hypertension: Secondary | ICD-10-CM | POA: Diagnosis not present

## 2018-06-08 DIAGNOSIS — F419 Anxiety disorder, unspecified: Secondary | ICD-10-CM | POA: Diagnosis not present

## 2018-06-08 DIAGNOSIS — F329 Major depressive disorder, single episode, unspecified: Secondary | ICD-10-CM | POA: Diagnosis not present

## 2018-06-25 ENCOUNTER — Other Ambulatory Visit: Payer: Self-pay

## 2018-06-29 DIAGNOSIS — M25512 Pain in left shoulder: Secondary | ICD-10-CM | POA: Diagnosis not present

## 2018-06-29 DIAGNOSIS — M25561 Pain in right knee: Secondary | ICD-10-CM | POA: Diagnosis not present

## 2018-06-29 DIAGNOSIS — M25562 Pain in left knee: Secondary | ICD-10-CM | POA: Diagnosis not present

## 2018-07-02 DIAGNOSIS — Z79899 Other long term (current) drug therapy: Secondary | ICD-10-CM | POA: Diagnosis not present

## 2018-07-02 DIAGNOSIS — M9982 Other biomechanical lesions of thoracic region: Secondary | ICD-10-CM | POA: Diagnosis not present

## 2018-07-02 DIAGNOSIS — M9983 Other biomechanical lesions of lumbar region: Secondary | ICD-10-CM | POA: Diagnosis not present

## 2018-07-02 DIAGNOSIS — M5136 Other intervertebral disc degeneration, lumbar region: Secondary | ICD-10-CM | POA: Diagnosis not present

## 2018-07-02 DIAGNOSIS — M5134 Other intervertebral disc degeneration, thoracic region: Secondary | ICD-10-CM | POA: Diagnosis not present

## 2018-07-19 DIAGNOSIS — G894 Chronic pain syndrome: Secondary | ICD-10-CM | POA: Diagnosis not present

## 2018-07-27 ENCOUNTER — Telehealth: Payer: Self-pay

## 2018-07-27 NOTE — Telephone Encounter (Signed)
   [facility] Pre-operative Risk Assessment    Request for surgical clearance:  1. What type of surgery is being performed? L2-S1 revision.  Laminectomy and fusion, removal of hardware and exploration of fusion, allograft   2. When is this surgery scheduled?  TBD   3. What type of clearance is required (medical clearance vs. Pharmacy clearance to hold med vs. Both)?  Both  Are there any medications that need to be held prior to surgery and how long? Aspirin 81 mg 7 days 4. Practice name and name of physician performing surgery?  [facility]/Dr Cohen   5. What is your office phone number [phone]    7.   What is your office fax number [phone]  8.   Anesthesia type (None, local, MAC, general) ? general   Jorge Sherman 07/27/2018, 3:15 PM  _________________________________________________________________   (provider comments below)

## 2018-07-30 NOTE — Telephone Encounter (Signed)
Dr Tamala Julian is it OK to hold ASA and Plavix on this patient?  Kerin Ransom PA-C 07/30/2018 4:19 PM

## 2018-07-31 NOTE — Telephone Encounter (Signed)
LMTCB  Decorian Schuenemann PA-C 07/31/2018 2:40 PM

## 2018-07-31 NOTE — Telephone Encounter (Signed)
Agree 

## 2018-07-31 NOTE — Telephone Encounter (Signed)
   Primary Cardiologist: Sinclair Grooms, MD  Chart reviewed as part of pre-operative protocol coverage. Given past medical history and time since last visit, based on ACC/AHA guidelines, Jorge Sherman would be at acceptable risk for the planned procedure without further cardiovascular testing.   He no longer takes aspirin or Plavix.  I will route this recommendation to the requesting party via Epic fax function and remove from pre-op pool.  Please call with questions.  Kerin Ransom, PA-C 07/31/2018, 2:55 PM

## 2018-08-01 DIAGNOSIS — M25562 Pain in left knee: Secondary | ICD-10-CM | POA: Diagnosis not present

## 2018-08-01 DIAGNOSIS — M25561 Pain in right knee: Secondary | ICD-10-CM | POA: Diagnosis not present

## 2018-08-01 DIAGNOSIS — M25512 Pain in left shoulder: Secondary | ICD-10-CM | POA: Diagnosis not present

## 2018-08-02 DIAGNOSIS — M5134 Other intervertebral disc degeneration, thoracic region: Secondary | ICD-10-CM | POA: Diagnosis not present

## 2018-08-02 DIAGNOSIS — M48061 Spinal stenosis, lumbar region without neurogenic claudication: Secondary | ICD-10-CM | POA: Diagnosis not present

## 2018-08-02 DIAGNOSIS — M5136 Other intervertebral disc degeneration, lumbar region: Secondary | ICD-10-CM | POA: Diagnosis not present

## 2018-08-02 DIAGNOSIS — Z79899 Other long term (current) drug therapy: Secondary | ICD-10-CM | POA: Diagnosis not present

## 2018-08-02 DIAGNOSIS — M4804 Spinal stenosis, thoracic region: Secondary | ICD-10-CM | POA: Diagnosis not present

## 2018-08-10 DIAGNOSIS — M545 Low back pain: Secondary | ICD-10-CM | POA: Diagnosis not present

## 2018-08-10 DIAGNOSIS — G894 Chronic pain syndrome: Secondary | ICD-10-CM | POA: Diagnosis not present

## 2018-08-10 DIAGNOSIS — M48062 Spinal stenosis, lumbar region with neurogenic claudication: Secondary | ICD-10-CM | POA: Diagnosis not present

## 2018-08-10 DIAGNOSIS — Z01818 Encounter for other preprocedural examination: Secondary | ICD-10-CM | POA: Diagnosis not present

## 2018-08-10 DIAGNOSIS — I491 Atrial premature depolarization: Secondary | ICD-10-CM | POA: Diagnosis not present

## 2018-08-14 DIAGNOSIS — R338 Other retention of urine: Secondary | ICD-10-CM | POA: Diagnosis not present

## 2018-08-14 DIAGNOSIS — J9690 Respiratory failure, unspecified, unspecified whether with hypoxia or hypercapnia: Secondary | ICD-10-CM | POA: Diagnosis not present

## 2018-08-14 DIAGNOSIS — M5126 Other intervertebral disc displacement, lumbar region: Secondary | ICD-10-CM | POA: Diagnosis not present

## 2018-08-14 DIAGNOSIS — G4733 Obstructive sleep apnea (adult) (pediatric): Secondary | ICD-10-CM | POA: Diagnosis not present

## 2018-08-14 DIAGNOSIS — T84296A Other mechanical complication of internal fixation device of vertebrae, initial encounter: Secondary | ICD-10-CM | POA: Diagnosis not present

## 2018-08-14 DIAGNOSIS — R072 Precordial pain: Secondary | ICD-10-CM | POA: Diagnosis not present

## 2018-08-14 DIAGNOSIS — R279 Unspecified lack of coordination: Secondary | ICD-10-CM | POA: Diagnosis not present

## 2018-08-14 DIAGNOSIS — G894 Chronic pain syndrome: Secondary | ICD-10-CM | POA: Diagnosis not present

## 2018-08-14 DIAGNOSIS — I2510 Atherosclerotic heart disease of native coronary artery without angina pectoris: Secondary | ICD-10-CM | POA: Diagnosis not present

## 2018-08-14 DIAGNOSIS — M47816 Spondylosis without myelopathy or radiculopathy, lumbar region: Secondary | ICD-10-CM | POA: Diagnosis not present

## 2018-08-14 DIAGNOSIS — Z9889 Other specified postprocedural states: Secondary | ICD-10-CM | POA: Diagnosis not present

## 2018-08-14 DIAGNOSIS — D62 Acute posthemorrhagic anemia: Secondary | ICD-10-CM | POA: Diagnosis not present

## 2018-08-14 DIAGNOSIS — J984 Other disorders of lung: Secondary | ICD-10-CM | POA: Diagnosis not present

## 2018-08-14 DIAGNOSIS — N401 Enlarged prostate with lower urinary tract symptoms: Secondary | ICD-10-CM | POA: Diagnosis present

## 2018-08-14 DIAGNOSIS — M47896 Other spondylosis, lumbar region: Secondary | ICD-10-CM | POA: Diagnosis not present

## 2018-08-14 DIAGNOSIS — J449 Chronic obstructive pulmonary disease, unspecified: Secondary | ICD-10-CM | POA: Diagnosis not present

## 2018-08-14 DIAGNOSIS — M48061 Spinal stenosis, lumbar region without neurogenic claudication: Secondary | ICD-10-CM | POA: Diagnosis present

## 2018-08-14 DIAGNOSIS — Z981 Arthrodesis status: Secondary | ICD-10-CM | POA: Diagnosis not present

## 2018-08-14 DIAGNOSIS — D72823 Leukemoid reaction: Secondary | ICD-10-CM | POA: Diagnosis not present

## 2018-08-14 DIAGNOSIS — R079 Chest pain, unspecified: Secondary | ICD-10-CM | POA: Diagnosis not present

## 2018-08-14 DIAGNOSIS — D649 Anemia, unspecified: Secondary | ICD-10-CM | POA: Diagnosis not present

## 2018-08-14 DIAGNOSIS — Z6837 Body mass index (BMI) 37.0-37.9, adult: Secondary | ICD-10-CM | POA: Diagnosis not present

## 2018-08-14 DIAGNOSIS — M4327 Fusion of spine, lumbosacral region: Secondary | ICD-10-CM | POA: Diagnosis not present

## 2018-08-14 DIAGNOSIS — M961 Postlaminectomy syndrome, not elsewhere classified: Secondary | ICD-10-CM | POA: Diagnosis not present

## 2018-08-14 DIAGNOSIS — M545 Low back pain: Secondary | ICD-10-CM | POA: Diagnosis not present

## 2018-08-14 DIAGNOSIS — I444 Left anterior fascicular block: Secondary | ICD-10-CM | POA: Diagnosis not present

## 2018-08-14 DIAGNOSIS — E785 Hyperlipidemia, unspecified: Secondary | ICD-10-CM | POA: Diagnosis present

## 2018-08-14 DIAGNOSIS — N179 Acute kidney failure, unspecified: Secondary | ICD-10-CM | POA: Diagnosis not present

## 2018-08-14 DIAGNOSIS — I499 Cardiac arrhythmia, unspecified: Secondary | ICD-10-CM | POA: Diagnosis not present

## 2018-08-14 DIAGNOSIS — I9581 Postprocedural hypotension: Secondary | ICD-10-CM | POA: Diagnosis not present

## 2018-08-14 DIAGNOSIS — M4716 Other spondylosis with myelopathy, lumbar region: Secondary | ICD-10-CM | POA: Diagnosis not present

## 2018-08-14 DIAGNOSIS — Z743 Need for continuous supervision: Secondary | ICD-10-CM | POA: Diagnosis not present

## 2018-08-14 DIAGNOSIS — K219 Gastro-esophageal reflux disease without esophagitis: Secondary | ICD-10-CM | POA: Diagnosis present

## 2018-08-14 DIAGNOSIS — E875 Hyperkalemia: Secondary | ICD-10-CM | POA: Diagnosis not present

## 2018-08-14 DIAGNOSIS — I10 Essential (primary) hypertension: Secondary | ICD-10-CM | POA: Diagnosis not present

## 2018-08-14 DIAGNOSIS — D72829 Elevated white blood cell count, unspecified: Secondary | ICD-10-CM | POA: Diagnosis not present

## 2018-08-14 DIAGNOSIS — M48062 Spinal stenosis, lumbar region with neurogenic claudication: Secondary | ICD-10-CM | POA: Diagnosis not present

## 2018-08-20 DIAGNOSIS — M961 Postlaminectomy syndrome, not elsewhere classified: Secondary | ICD-10-CM | POA: Diagnosis not present

## 2018-08-20 DIAGNOSIS — N400 Enlarged prostate without lower urinary tract symptoms: Secondary | ICD-10-CM | POA: Diagnosis not present

## 2018-08-20 DIAGNOSIS — E785 Hyperlipidemia, unspecified: Secondary | ICD-10-CM | POA: Diagnosis not present

## 2018-08-20 DIAGNOSIS — M62838 Other muscle spasm: Secondary | ICD-10-CM | POA: Diagnosis not present

## 2018-08-20 DIAGNOSIS — G894 Chronic pain syndrome: Secondary | ICD-10-CM | POA: Diagnosis not present

## 2018-08-20 DIAGNOSIS — K219 Gastro-esophageal reflux disease without esophagitis: Secondary | ICD-10-CM | POA: Diagnosis not present

## 2018-08-20 DIAGNOSIS — I2510 Atherosclerotic heart disease of native coronary artery without angina pectoris: Secondary | ICD-10-CM | POA: Diagnosis not present

## 2018-08-20 DIAGNOSIS — M6281 Muscle weakness (generalized): Secondary | ICD-10-CM | POA: Diagnosis not present

## 2018-08-20 DIAGNOSIS — I10 Essential (primary) hypertension: Secondary | ICD-10-CM | POA: Diagnosis not present

## 2018-08-20 DIAGNOSIS — J95821 Acute postprocedural respiratory failure: Secondary | ICD-10-CM | POA: Diagnosis not present

## 2018-08-20 DIAGNOSIS — D500 Iron deficiency anemia secondary to blood loss (chronic): Secondary | ICD-10-CM | POA: Diagnosis not present

## 2018-08-20 DIAGNOSIS — R339 Retention of urine, unspecified: Secondary | ICD-10-CM | POA: Diagnosis not present

## 2018-08-22 DIAGNOSIS — D500 Iron deficiency anemia secondary to blood loss (chronic): Secondary | ICD-10-CM | POA: Diagnosis not present

## 2018-08-22 DIAGNOSIS — N400 Enlarged prostate without lower urinary tract symptoms: Secondary | ICD-10-CM | POA: Diagnosis not present

## 2018-08-22 DIAGNOSIS — I10 Essential (primary) hypertension: Secondary | ICD-10-CM | POA: Diagnosis not present

## 2018-08-22 DIAGNOSIS — G894 Chronic pain syndrome: Secondary | ICD-10-CM | POA: Diagnosis not present

## 2018-08-22 DIAGNOSIS — I2510 Atherosclerotic heart disease of native coronary artery without angina pectoris: Secondary | ICD-10-CM | POA: Diagnosis not present

## 2018-08-22 DIAGNOSIS — J95821 Acute postprocedural respiratory failure: Secondary | ICD-10-CM | POA: Diagnosis not present

## 2018-08-22 DIAGNOSIS — K219 Gastro-esophageal reflux disease without esophagitis: Secondary | ICD-10-CM | POA: Diagnosis not present

## 2018-08-22 DIAGNOSIS — M961 Postlaminectomy syndrome, not elsewhere classified: Secondary | ICD-10-CM | POA: Diagnosis not present

## 2018-08-22 DIAGNOSIS — E785 Hyperlipidemia, unspecified: Secondary | ICD-10-CM | POA: Diagnosis not present

## 2018-08-22 DIAGNOSIS — M62838 Other muscle spasm: Secondary | ICD-10-CM | POA: Diagnosis not present

## 2018-08-22 DIAGNOSIS — M6281 Muscle weakness (generalized): Secondary | ICD-10-CM | POA: Diagnosis not present

## 2018-08-22 DIAGNOSIS — R339 Retention of urine, unspecified: Secondary | ICD-10-CM | POA: Diagnosis not present

## 2018-08-23 DIAGNOSIS — R339 Retention of urine, unspecified: Secondary | ICD-10-CM | POA: Diagnosis not present

## 2018-08-23 DIAGNOSIS — M4326 Fusion of spine, lumbar region: Secondary | ICD-10-CM | POA: Diagnosis not present

## 2018-08-23 DIAGNOSIS — M961 Postlaminectomy syndrome, not elsewhere classified: Secondary | ICD-10-CM | POA: Diagnosis not present

## 2018-08-23 DIAGNOSIS — M62838 Other muscle spasm: Secondary | ICD-10-CM | POA: Diagnosis not present

## 2018-08-23 DIAGNOSIS — I10 Essential (primary) hypertension: Secondary | ICD-10-CM | POA: Diagnosis not present

## 2018-08-23 DIAGNOSIS — D500 Iron deficiency anemia secondary to blood loss (chronic): Secondary | ICD-10-CM | POA: Diagnosis not present

## 2018-08-23 DIAGNOSIS — N400 Enlarged prostate without lower urinary tract symptoms: Secondary | ICD-10-CM | POA: Diagnosis not present

## 2018-08-23 DIAGNOSIS — G894 Chronic pain syndrome: Secondary | ICD-10-CM | POA: Diagnosis not present

## 2018-08-23 DIAGNOSIS — M6281 Muscle weakness (generalized): Secondary | ICD-10-CM | POA: Diagnosis not present

## 2018-08-23 DIAGNOSIS — I2510 Atherosclerotic heart disease of native coronary artery without angina pectoris: Secondary | ICD-10-CM | POA: Diagnosis not present

## 2018-08-23 DIAGNOSIS — K219 Gastro-esophageal reflux disease without esophagitis: Secondary | ICD-10-CM | POA: Diagnosis not present

## 2018-08-23 DIAGNOSIS — E785 Hyperlipidemia, unspecified: Secondary | ICD-10-CM | POA: Diagnosis not present

## 2018-08-24 ENCOUNTER — Emergency Department (HOSPITAL_COMMUNITY): Payer: Medicare Other

## 2018-08-24 ENCOUNTER — Emergency Department (HOSPITAL_COMMUNITY)
Admission: EM | Admit: 2018-08-24 | Discharge: 2018-08-24 | Disposition: A | Discharge status: Home / Self Care | Payer: Medicare Other | Attending: Emergency Medicine | Admitting: Emergency Medicine

## 2018-08-24 ENCOUNTER — Other Ambulatory Visit: Payer: Self-pay

## 2018-08-24 DIAGNOSIS — E119 Type 2 diabetes mellitus without complications: Secondary | ICD-10-CM | POA: Diagnosis not present

## 2018-08-24 DIAGNOSIS — R0902 Hypoxemia: Secondary | ICD-10-CM | POA: Diagnosis not present

## 2018-08-24 DIAGNOSIS — J9601 Acute respiratory failure with hypoxia: Secondary | ICD-10-CM | POA: Diagnosis not present

## 2018-08-24 DIAGNOSIS — I10 Essential (primary) hypertension: Secondary | ICD-10-CM | POA: Diagnosis not present

## 2018-08-24 DIAGNOSIS — Z79899 Other long term (current) drug therapy: Secondary | ICD-10-CM | POA: Diagnosis not present

## 2018-08-24 DIAGNOSIS — G8929 Other chronic pain: Secondary | ICD-10-CM | POA: Diagnosis not present

## 2018-08-24 DIAGNOSIS — R2689 Other abnormalities of gait and mobility: Secondary | ICD-10-CM | POA: Diagnosis not present

## 2018-08-24 DIAGNOSIS — I9589 Other hypotension: Secondary | ICD-10-CM | POA: Diagnosis not present

## 2018-08-24 DIAGNOSIS — M5432 Sciatica, left side: Secondary | ICD-10-CM | POA: Diagnosis not present

## 2018-08-24 DIAGNOSIS — M5442 Lumbago with sciatica, left side: Secondary | ICD-10-CM | POA: Diagnosis not present

## 2018-08-24 DIAGNOSIS — M2550 Pain in unspecified joint: Secondary | ICD-10-CM | POA: Diagnosis not present

## 2018-08-24 DIAGNOSIS — Z7401 Bed confinement status: Secondary | ICD-10-CM | POA: Diagnosis not present

## 2018-08-24 DIAGNOSIS — M545 Low back pain: Secondary | ICD-10-CM | POA: Diagnosis not present

## 2018-08-24 DIAGNOSIS — Z79891 Long term (current) use of opiate analgesic: Secondary | ICD-10-CM | POA: Diagnosis not present

## 2018-08-24 DIAGNOSIS — I959 Hypotension, unspecified: Secondary | ICD-10-CM | POA: Diagnosis not present

## 2018-08-24 LAB — COMPREHENSIVE METABOLIC PANEL
ALK PHOS: 69 U/L (ref 38–126)
ALT: 31 U/L (ref 0–44)
AST: 20 U/L (ref 15–41)
Albumin: 3.2 g/dL — ABNORMAL LOW (ref 3.5–5.0)
Anion gap: 11 (ref 5–15)
BILIRUBIN TOTAL: 0.4 mg/dL (ref 0.3–1.2)
BUN: 26 mg/dL — AB (ref 8–23)
CALCIUM: 9 mg/dL (ref 8.9–10.3)
CO2: 27 mmol/L (ref 22–32)
Chloride: 96 mmol/L — ABNORMAL LOW (ref 98–111)
Creatinine, Ser: 1.38 mg/dL — ABNORMAL HIGH (ref 0.61–1.24)
GFR calc Af Amer: 55 mL/min — ABNORMAL LOW (ref >60)
GFR calc non Af Amer: 48 mL/min — ABNORMAL LOW (ref >60)
Glucose, Bld: 123 mg/dL — ABNORMAL HIGH (ref 70–99)
Potassium: 4 mmol/L (ref 3.5–5.1)
Sodium: 134 mmol/L — ABNORMAL LOW (ref 135–145)
TOTAL PROTEIN: 6.3 g/dL — AB (ref 6.5–8.1)

## 2018-08-24 LAB — CBC WITH DIFFERENTIAL/PLATELET
BASOS ABS: 0 10*3/uL (ref 0.0–0.1)
Basophils Relative: 0 %
EOS PCT: 2 %
Eosinophils Absolute: 0.3 10*3/uL (ref 0.0–0.7)
HCT: 25.5 % — ABNORMAL LOW (ref 39.0–52.0)
Hemoglobin: 8.3 g/dL — ABNORMAL LOW (ref 13.0–17.0)
Lymphocytes Relative: 11 %
Lymphs Abs: 1.6 10*3/uL (ref 0.7–4.0)
MCH: 28.9 pg (ref 26.0–34.0)
MCHC: 32.5 g/dL (ref 30.0–36.0)
MCV: 88.9 fL (ref 78.0–100.0)
MONO ABS: 1.8 10*3/uL — AB (ref 0.1–1.0)
Monocytes Relative: 12 %
Neutro Abs: 10.8 10*3/uL — ABNORMAL HIGH (ref 1.7–7.7)
Neutrophils Relative %: 75 %
PLATELETS: 524 10*3/uL — AB (ref 150–400)
RBC: 2.87 MIL/uL — ABNORMAL LOW (ref 4.22–5.81)
RDW: 13.3 % (ref 11.5–15.5)
WBC: 14.5 10*3/uL — ABNORMAL HIGH (ref 4.0–10.5)

## 2018-08-24 LAB — URINALYSIS, ROUTINE W REFLEX MICROSCOPIC
Bilirubin Urine: NEGATIVE
Glucose, UA: NEGATIVE mg/dL
Hgb urine dipstick: NEGATIVE
KETONES UR: NEGATIVE mg/dL
Leukocytes, UA: NEGATIVE
Nitrite: NEGATIVE
PH: 5 (ref 5.0–8.0)
PROTEIN: 30 mg/dL — AB
Specific Gravity, Urine: 1.017 (ref 1.005–1.030)

## 2018-08-24 LAB — SEDIMENTATION RATE: SED RATE: 55 mm/h — AB (ref 0–16)

## 2018-08-24 LAB — C-REACTIVE PROTEIN: CRP: 1.3 mg/dL — ABNORMAL HIGH (ref <1.0)

## 2018-08-24 MED ORDER — ONDANSETRON HCL 4 MG/2ML IJ SOLN
4.0000 mg | Freq: Once | INTRAMUSCULAR | Status: AC
  Administered 2018-08-24: 4 mg via INTRAVENOUS
  Filled 2018-08-24 (×2): qty 2

## 2018-08-24 MED ORDER — GADOBUTROL 1 MMOL/ML IV SOLN
10.0000 mL | Freq: Once | INTRAVENOUS | Status: AC | PRN
  Administered 2018-08-24: 10 mL via INTRAVENOUS

## 2018-08-24 MED ORDER — HYDROMORPHONE HCL 1 MG/ML IJ SOLN
1.0000 mg | Freq: Once | INTRAMUSCULAR | Status: AC
  Administered 2018-08-24: 1 mg via INTRAVENOUS
  Filled 2018-08-24 (×2): qty 1

## 2018-08-24 MED ORDER — SODIUM CHLORIDE 0.9 % IV BOLUS
1000.0000 mL | Freq: Once | INTRAVENOUS | Status: AC
  Administered 2018-08-24: 1000 mL via INTRAVENOUS

## 2018-08-24 MED ORDER — METHYLPREDNISOLONE SODIUM SUCC 125 MG IJ SOLR
125.0000 mg | Freq: Once | INTRAMUSCULAR | Status: AC
  Administered 2018-08-24: 125 mg via INTRAVENOUS
  Filled 2018-08-24: qty 2

## 2018-08-24 MED ORDER — PREDNISONE 10 MG (21) PO TBPK: ORAL_TABLET | ORAL | 0 refills | Status: DC

## 2018-08-24 NOTE — ED Notes (Signed)
Patient transported to MRI 

## 2018-08-24 NOTE — ED Notes (Signed)
Pt transported back to facility by [facility] °

## 2018-08-24 NOTE — ED Notes (Signed)
Bed: NB39 Expected date:  Expected time:  Means of arrival:  Comments: EMS-back pain/s/p surgery

## 2018-08-24 NOTE — ED Notes (Signed)
Unable to obtain IV access or blood work at this time. IV team consult has been placed.

## 2018-08-24 NOTE — ED Notes (Signed)
[facility] contacted for transport 

## 2018-08-24 NOTE — ED Notes (Signed)
Patient transported to X-ray 

## 2018-08-24 NOTE — ED Triage Notes (Signed)
Transported by [facility] Klinefelter from [facility]-- had back surgery last Tuesday and presents today with increased pain to back.

## 2018-08-24 NOTE — ED Provider Notes (Signed)
[facility] DEPT Provider Note   CSN: [id number] Arrival date & time: 08/24/18  1628     History   Chief Complaint Chief Complaint  Patient presents with  . Back Pain    HPI Jorge Sherman is a 77 y.o. male.  Pt presents to the ED today with back pain.  Pt had a revision laminectomy on 9/24 because of continued pain and numbness in left leg.  Pt said he had his surgery at [facility] by Dr. Rennis Harding.  The pt said he was dropped on the ground by nurses trying to get him up the day after surgery.  He said he had xrays done which he was told were normal.  He has had uncontrolled pain since then.  He was d/c on the 28th and d/c summary said his pain was controlled.  The pt did have a foley while in the hospital and d/c summary said it was removed and he was able to void prior to d/c, but it is back in now.  Pt said it's draining well.  Pt has been taking high doses of opiates, but is still having pain.  Pt reports good bowel movements.  No dysuria.  No fever.     Past Medical History:  Diagnosis Date  . Chronic pain   . Depressive disorder, not elsewhere classified   . Dizziness and giddiness   . Essential and other specified forms of tremor   . Headache(784.0)   . Leukocytosis, unspecified   . Obstructive sleep apnea (adult) (pediatric)   . Pain in joint, lower leg   . Spinal stenosis, lumbar region, without neurogenic claudication   . Thoracic or lumbosacral neuritis or radiculitis, unspecified   . Type II or unspecified type diabetes mellitus without mention of complication, not stated as uncontrolled   . Unspecified cardiovascular disease   . Unspecified essential hypertension     Patient Active Problem List   Diagnosis Date Noted  . Erectile dysfunction 10/30/2013  . Essential hypertension, benign 10/30/2013  . Coronary atherosclerosis of native coronary artery 10/30/2013  . Dizziness and giddiness   . Pain in joint, lower leg   .  Leukocytosis, unspecified   . Type II or unspecified type diabetes mellitus without mention of complication, not stated as uncontrolled   . Obstructive sleep apnea   . Depressive disorder, not elsewhere classified   . Essential and other specified forms of tremor   . Headache(784.0)   . Spinal stenosis, lumbar region, without neurogenic claudication   . Thoracic or lumbosacral neuritis or radiculitis, unspecified   . Chronic pain     Past Surgical History:  Procedure Laterality Date  . BACK SURGERY    . back surgey     times 3        Home Medications    Prior to Admission medications   Medication Sig Start Date End Date Taking? Authorizing Provider  acetaminophen (TYLENOL) 500 MG tablet Take 500 mg by mouth 3 (three) times daily.   Yes [provider]  DULoxetine (CYMBALTA) 30 MG capsule Take 90 mg by mouth daily.   Yes [provider]  gabapentin (NEURONTIN) 800 MG tablet Take 800 mg by mouth 4 (four) times daily.   Yes [provider]  HYDROmorphone (DILAUDID) 4 MG tablet Take 4 mg by mouth every 4 (four) hours as needed for severe pain.   Yes [provider]  hydroxychloroquine (PLAQUENIL) 200 MG tablet Take 200 mg by mouth 2 (  two) times daily.   Yes [provider]  Iron-FA-B Cmp-C-Biot-Probiotic (FUSION PLUS) CAPS Take 1 tablet by mouth daily. 02/26/18  Yes [provider]  linaclotide (LINZESS) 145 MCG CAPS capsule Take 145 mcg by mouth daily.  11/26/17  Yes [provider]  LORazepam (ATIVAN) 1 MG tablet Take 1 mg by mouth every 6 (six) hours as needed for anxiety.    Yes [provider]  metaxalone (SKELAXIN) 800 MG tablet Take 800 mg by mouth 3 (three) times daily. 10/27/17 10/27/18 Yes [provider]  olmesartan (BENICAR) 40 MG tablet Take 40 mg by mouth daily.   Yes [provider]  omeprazole (PRILOSEC) 40 MG capsule Take 40 mg by mouth daily.   Yes [provider]    rosuvastatin (CRESTOR) 20 MG tablet Take 20 mg by mouth every evening.   Yes [provider]  tamsulosin (FLOMAX) 0.4 MG CAPS capsule Take 0.4 mg by mouth daily.   Yes [provider]  predniSONE (STERAPRED UNI-PAK 21 TAB) 10 MG (21) TBPK tablet Take 6 tabs by mouth daily  for 2 days, then 5 tabs for 2 days, then 4 tabs for 2 days, then 3 tabs for 2 days, 2 tabs for 2 days, then 1 tab by mouth daily for 2 days 08/24/18   Isla Pence, MD    Family History Family History  Problem Relation Age of Onset  . Cancer Father   . Heart disease Father   . Stroke Mother   . Diabetes Mother     Social History Social History   Tobacco Use  . Smoking status: Unknown If Ever Smoked  Substance Use Topics  . Alcohol use: No  . Drug use: No     Allergies   Iohexol and Iodine   Review of Systems Review of Systems  Musculoskeletal: Positive for back pain.  All other systems reviewed and are negative.    Physical Exam Updated Vital Signs BP 120/61 (BP Location: Left Arm)   Pulse 97   Temp 98.1 F (36.7 C)   Resp 18   SpO2 98%   Physical Exam  Constitutional: He appears well-developed and well-nourished.  HENT:  Head: Normocephalic and atraumatic.  Right Ear: External ear normal.  Left Ear: External ear normal.  Nose: Nose normal.  Mouth/Throat: Oropharynx is clear and moist.  Eyes: Pupils are equal, round, and reactive to light. Conjunctivae and EOM are normal.  Neck: Normal range of motion. Neck supple.  Cardiovascular: Normal rate, regular rhythm, normal heart sounds and intact distal pulses.  Pulmonary/Chest: Effort normal and breath sounds normal.  Abdominal: Soft. Bowel sounds are normal.  Musculoskeletal:       Lumbar back: He exhibits tenderness.  Skin: Skin is warm. Capillary refill takes less than 2 seconds.  Psychiatric: He has a normal mood and affect. His behavior is normal. Judgment and thought content normal.  Nursing note and vitals  reviewed.    ED Treatments / Results  Labs (all labs ordered are listed, but only abnormal results are displayed) Labs Reviewed  COMPREHENSIVE METABOLIC PANEL - Abnormal; Notable for the following components:      Result Value   Sodium 134 (*)    Chloride 96 (*)    Glucose, Bld 123 (*)    BUN 26 (*)    Creatinine, Ser 1.38 (*)    Total Protein 6.3 (*)    Albumin 3.2 (*)    GFR calc non Af Amer 48 (*)  GFR calc Af Amer 55 (*)    All other components within normal limits  CBC WITH DIFFERENTIAL/PLATELET - Abnormal; Notable for the following components:   WBC 14.5 (*)    RBC 2.87 (*)    Hemoglobin 8.3 (*)    HCT 25.5 (*)    Platelets 524 (*)    Neutro Abs 10.8 (*)    Monocytes Absolute 1.8 (*)    All other components within normal limits  URINALYSIS, ROUTINE W REFLEX MICROSCOPIC - Abnormal; Notable for the following components:   Protein, ur 30 (*)    Bacteria, UA RARE (*)    All other components within normal limits  C-REACTIVE PROTEIN - Abnormal; Notable for the following components:   CRP 1.3 (*)    All other components within normal limits  SEDIMENTATION RATE - Abnormal; Notable for the following components:   Sed Rate 55 (*)    All other components within normal limits  URINE CULTURE    EKG None  Radiology Dg Lumbar Spine Complete  Result Date: 08/24/2018 CLINICAL DATA:  Worsening low back pain. Status post revision lumbar spine surgery ten days ago. EXAM: LUMBAR SPINE - COMPLETE 4+ VIEW COMPARISON:  Lumbar spine radiograph August 18, 2018 FINDINGS: Status post L2 L5-S1 PLIF with intact hardware and bridging rods. Old S1 screws. L2-3 and L3-4 interbody fusion material. Old moderate L1 compression fracture, status post cement augmentation. No acute fracture deformity. No malalignment. No destructive bony lesions. Aortoiliac calcifications. Gas distended colon to at least 10 cm. IMPRESSION: 1. Gas distended colon to at least 10 cm most compatible with ileus. 2. No  acute fracture deformity or malalignment. Stable appearance of lumbar spine hardware. 3. Old moderate L1 compression fracture, status post cement augmentation. Electronically Signed   By: Elon Alas M.D.   On: 08/24/2018 17:40   Mr Lumbar Spine W Wo Contrast  Result Date: 08/24/2018 CLINICAL DATA:  Extreme back pain after recurrent lumbar spine surgery, last was 10 days ago. EXAM: MRI LUMBAR SPINE WITHOUT AND WITH CONTRAST TECHNIQUE: Multiplanar and multiecho pulse sequences of the lumbar spine were obtained without and with intravenous contrast. CONTRAST:  10 cc Gadavist COMPARISON:  Lumbar spine radiograph August 24, 2018 and CT abdomen and pelvis December 18, 2017. FINDINGS: Mild motion degraded examination. SEGMENTATION: For the purposes of this report, the last well-formed intervertebral disc is reported as L5-S1. ALIGNMENT: Maintained lumbar lordosis. No malalignment. VERTEBRAE:Old moderate L1 compression fracture with central low signal compatible with cement augmentation. Remaining lumbar vertebral bodies intact. L2-3 and L3-4 interbody fusion material with minimal disc edema and enhancement at L2. Mild L5-S1 disc height loss with desiccation. L5-S1 bridging bone marrow signal compatible with arthrodesis. Status post L2 through S1 PLIF. No suspicious bone marrow signal. No suspicious osseous enhancement. CONUS MEDULLARIS AND CAUDA EQUINA: Conus medullaris terminates at L1 and demonstrates normal morphology and signal characteristics. Cauda equina is normal. No abnormal cord, LEFT-dominant or epidural enhancement. PARASPINAL AND OTHER SOFT TISSUES: 7 x 8 x 54 mm (transverse by AP by cc) paraspinal fluid collection at L1-2 with imaging characteristics of seroma. Postoperative paraspinal muscle denervation. DISC LEVELS: T11-12 (no axial images): Small broad-based disc bulge and advanced facet arthropathy resulting in moderate canal stenosis and moderate to severe neural foraminal narrowing. T12-L1:  (No axial images): Small broad-based disc bulge without canal stenosis. Moderate neural foraminal narrowing. T12-L1: Small broad-based disc bulge, mild facet arthropathy, mild canal stenosis. Mild neural foraminal narrowing. L1-2: Moderate RIGHT extraforaminal disc protrusion deflecting the course  of the exited RIGHT L2 nerve. Mild facet arthropathy and ligamentum flavum redundancy. Mild canal stenosis. Moderate RIGHT and mild LEFT neural foraminal narrowing. L2-3, L3-4, L4-5: PLIF and posterior decompression. No canal stenosis. Mild neural foraminal narrowing may be overestimated by hardware artifact. L5-S1: PLIF. Predominantly obscured by hardware artifact. No canal stenosis. Obscured neural foramen. IMPRESSION: 1. Status post L2 through S1 PLIF, minimal enhancement within the L2-3 disc space may be postoperative. Small probable seroma paraspinal soft tissues mom surgical approach. 2. No acute fracture. Old moderate L1 compression fracture, status post cement augmentation. 3. Moderate L1-2 disc extrusion resulting in exited RIGHT L2 nerve impingement. 4. Moderate canal stenosis T11-12. Mild canal stenosis T12-L1 and L1-2. 5. Multilevel neural foraminal narrowing: Moderate to severe at T11-12. Electronically Signed   By: Elon Alas M.D.   On: 08/24/2018 20:11    Procedures Procedures (including critical care time)  Medications Ordered in ED Medications  sodium chloride 0.9 % bolus 1,000 mL (1,000 mLs Intravenous New Bag/Given 08/24/18 2023)  methylPREDNISolone sodium succinate (SOLU-MEDROL) 125 mg/2 mL injection 125 mg (has no administration in time range)  HYDROmorphone (DILAUDID) injection 1 mg (1 mg Intravenous Given 08/24/18 1910)  ondansetron (ZOFRAN) injection 4 mg (4 mg Intravenous Given 08/24/18 1910)  gadobutrol (GADAVIST) 1 MMOL/ML injection 10 mL (10 mLs Intravenous Contrast Given 08/24/18 1948)     Initial Impression / Assessment and Plan / ED Course  I have reviewed the triage vital  signs and the nursing notes.  Pertinent labs & imaging results that were available during my care of the patient were reviewed by me and considered in my medical decision making (see chart for details).    Hgb 7.2 at discharge from [facility].  Apparently there was a lot of blood loss during there surgery.  Hgb is improving.  Pt to continue taking iron.  Pain has improved while here.  Pt's MRI with no evidence of infection.  I will try a steroid taper to see if that will help pain.  Pt to f/u with Dr. Patrice Paradise.   Final Clinical Impressions(s) / ED Diagnoses   Final diagnoses:  Sciatica of left side    ED Discharge Orders         Ordered    predniSONE (STERAPRED UNI-PAK 21 TAB) 10 MG (21) TBPK tablet     08/24/18 2113           Isla Pence, MD 08/24/18 2113

## 2018-08-27 DIAGNOSIS — E785 Hyperlipidemia, unspecified: Secondary | ICD-10-CM | POA: Diagnosis not present

## 2018-08-27 DIAGNOSIS — M62838 Other muscle spasm: Secondary | ICD-10-CM | POA: Diagnosis not present

## 2018-08-27 DIAGNOSIS — M961 Postlaminectomy syndrome, not elsewhere classified: Secondary | ICD-10-CM | POA: Diagnosis not present

## 2018-08-27 DIAGNOSIS — M6281 Muscle weakness (generalized): Secondary | ICD-10-CM | POA: Diagnosis not present

## 2018-08-27 DIAGNOSIS — M4326 Fusion of spine, lumbar region: Secondary | ICD-10-CM | POA: Diagnosis not present

## 2018-08-27 DIAGNOSIS — N400 Enlarged prostate without lower urinary tract symptoms: Secondary | ICD-10-CM | POA: Diagnosis not present

## 2018-08-27 DIAGNOSIS — D500 Iron deficiency anemia secondary to blood loss (chronic): Secondary | ICD-10-CM | POA: Diagnosis not present

## 2018-08-27 DIAGNOSIS — I2510 Atherosclerotic heart disease of native coronary artery without angina pectoris: Secondary | ICD-10-CM | POA: Diagnosis not present

## 2018-08-27 DIAGNOSIS — K219 Gastro-esophageal reflux disease without esophagitis: Secondary | ICD-10-CM | POA: Diagnosis not present

## 2018-08-27 DIAGNOSIS — G894 Chronic pain syndrome: Secondary | ICD-10-CM | POA: Diagnosis not present

## 2018-08-27 DIAGNOSIS — R339 Retention of urine, unspecified: Secondary | ICD-10-CM | POA: Diagnosis not present

## 2018-08-27 DIAGNOSIS — I10 Essential (primary) hypertension: Secondary | ICD-10-CM | POA: Diagnosis not present

## 2018-08-27 LAB — URINE CULTURE: Culture: 30000 — AB

## 2018-08-28 ENCOUNTER — Telehealth: Payer: Self-pay | Admitting: Emergency Medicine

## 2018-08-28 NOTE — Progress Notes (Addendum)
ED Antimicrobial Stewardship Positive Culture Follow Up   Jorge Sherman is an 77 y.o. male who presented to [facility] on 08/24/2018 with a chief complaint of  Chief Complaint  Patient presents with  . Back Pain    Recent Results (from the past 720 hour(s))  Urine culture     Status: Abnormal   Collection Time: 08/24/18  8:28 PM  Result Value Ref Range Status   Specimen Description   Final    URINE, RANDOM Performed at [facility] [address], [place], [place] [address]    Special Requests   Final    NONE Performed at [facility], [place] [address], [place], [place] [address]    Culture 30,000 COLONIES/mL ESCHERICHIA COLI (A)  Final   Report Status 08/27/2018 FINAL  Final   Organism ID, Bacteria ESCHERICHIA COLI (A)  Final      Susceptibility   Escherichia coli - MIC*    AMPICILLIN >=32 RESISTANT Resistant     CEFAZOLIN <=4 SENSITIVE Sensitive     CEFTRIAXONE <=1 SENSITIVE Sensitive     CIPROFLOXACIN <=0.25 SENSITIVE Sensitive     GENTAMICIN <=1 SENSITIVE Sensitive     IMIPENEM <=0.25 SENSITIVE Sensitive     NITROFURANTOIN <=16 SENSITIVE Sensitive     TRIMETH/SULFA <=20 SENSITIVE Sensitive     AMPICILLIN/SULBACTAM 16 INTERMEDIATE Intermediate     PIP/TAZO <=4 SENSITIVE Sensitive     Extended ESBL NEGATIVE Sensitive     * 30,000 COLONIES/mL ESCHERICHIA COLI   Patient reported no urinary symptoms (dysuria, hematuria, frequency). Patient discharged with no prescription. Asymptomatic bacteriuria, treatment not indicated at this time.    ED Provider: Nuala Alpha PA-C   [name] 08/28/2018, 10:26 AM Pharmacy Student  Monday - Friday phone -  [phone] Saturday - Sunday phone - [phone]

## 2018-08-28 NOTE — Telephone Encounter (Signed)
Post ED Visit - Positive Culture Follow-up  Culture report reviewed by antimicrobial stewardship pharmacist:  []  Elenor Quinones, Pharm.D. []  Heide Guile, Pharm.D., BCPS AQ-ID []  Parks Neptune, Pharm.D., BCPS []  Alycia Rossetti, Pharm.D., BCPS []  [place], Pharm.D., BCPS, AAHIVP []  Legrand Como, Pharm.D., BCPS, AAHIVP []  Salome Arnt, PharmD, BCPS []  Johnnette Gourd, PharmD, BCPS []  Hughes Better, PharmD, BCPS []  Leeroy Cha, PharmD [name] PharmD  Positive urine culture Treated with none, no further patient follow-up is required at this time.  Hazle Nordmann 08/28/2018, 11:12 AM

## 2018-08-29 DIAGNOSIS — R339 Retention of urine, unspecified: Secondary | ICD-10-CM | POA: Diagnosis not present

## 2018-08-29 DIAGNOSIS — E785 Hyperlipidemia, unspecified: Secondary | ICD-10-CM | POA: Diagnosis not present

## 2018-08-29 DIAGNOSIS — M6281 Muscle weakness (generalized): Secondary | ICD-10-CM | POA: Diagnosis not present

## 2018-08-29 DIAGNOSIS — R52 Pain, unspecified: Secondary | ICD-10-CM | POA: Diagnosis not present

## 2018-08-29 DIAGNOSIS — D500 Iron deficiency anemia secondary to blood loss (chronic): Secondary | ICD-10-CM | POA: Diagnosis not present

## 2018-08-29 DIAGNOSIS — R279 Unspecified lack of coordination: Secondary | ICD-10-CM | POA: Diagnosis not present

## 2018-08-29 DIAGNOSIS — I2510 Atherosclerotic heart disease of native coronary artery without angina pectoris: Secondary | ICD-10-CM | POA: Diagnosis not present

## 2018-08-29 DIAGNOSIS — G894 Chronic pain syndrome: Secondary | ICD-10-CM | POA: Diagnosis not present

## 2018-08-29 DIAGNOSIS — K219 Gastro-esophageal reflux disease without esophagitis: Secondary | ICD-10-CM | POA: Diagnosis not present

## 2018-08-29 DIAGNOSIS — I10 Essential (primary) hypertension: Secondary | ICD-10-CM | POA: Diagnosis not present

## 2018-08-29 DIAGNOSIS — Z743 Need for continuous supervision: Secondary | ICD-10-CM | POA: Diagnosis not present

## 2018-08-29 DIAGNOSIS — M961 Postlaminectomy syndrome, not elsewhere classified: Secondary | ICD-10-CM | POA: Diagnosis not present

## 2018-08-29 DIAGNOSIS — N400 Enlarged prostate without lower urinary tract symptoms: Secondary | ICD-10-CM | POA: Diagnosis not present

## 2018-08-29 DIAGNOSIS — M62838 Other muscle spasm: Secondary | ICD-10-CM | POA: Diagnosis not present

## 2018-08-29 DIAGNOSIS — M4326 Fusion of spine, lumbar region: Secondary | ICD-10-CM | POA: Diagnosis not present

## 2018-08-31 DIAGNOSIS — F419 Anxiety disorder, unspecified: Secondary | ICD-10-CM | POA: Diagnosis not present

## 2018-08-31 DIAGNOSIS — G894 Chronic pain syndrome: Secondary | ICD-10-CM | POA: Diagnosis not present

## 2018-09-03 ENCOUNTER — Emergency Department (HOSPITAL_COMMUNITY)
Admission: EM | Admit: 2018-09-03 | Discharge: 2018-09-03 | Disposition: A | Discharge status: Home / Self Care | Payer: Medicare Other | Attending: Emergency Medicine | Admitting: Emergency Medicine

## 2018-09-03 ENCOUNTER — Other Ambulatory Visit: Payer: Self-pay

## 2018-09-03 ENCOUNTER — Encounter (HOSPITAL_COMMUNITY): Payer: Self-pay | Admitting: Emergency Medicine

## 2018-09-03 DIAGNOSIS — R339 Retention of urine, unspecified: Secondary | ICD-10-CM | POA: Diagnosis not present

## 2018-09-03 DIAGNOSIS — Z79899 Other long term (current) drug therapy: Secondary | ICD-10-CM | POA: Insufficient documentation

## 2018-09-03 DIAGNOSIS — M2550 Pain in unspecified joint: Secondary | ICD-10-CM | POA: Diagnosis not present

## 2018-09-03 DIAGNOSIS — I2510 Atherosclerotic heart disease of native coronary artery without angina pectoris: Secondary | ICD-10-CM | POA: Insufficient documentation

## 2018-09-03 DIAGNOSIS — I10 Essential (primary) hypertension: Secondary | ICD-10-CM | POA: Insufficient documentation

## 2018-09-03 DIAGNOSIS — M4800 Spinal stenosis, site unspecified: Secondary | ICD-10-CM | POA: Diagnosis not present

## 2018-09-03 DIAGNOSIS — G8929 Other chronic pain: Secondary | ICD-10-CM | POA: Diagnosis not present

## 2018-09-03 DIAGNOSIS — M545 Low back pain: Secondary | ICD-10-CM | POA: Diagnosis not present

## 2018-09-03 DIAGNOSIS — K219 Gastro-esophageal reflux disease without esophagitis: Secondary | ICD-10-CM | POA: Diagnosis not present

## 2018-09-03 DIAGNOSIS — M961 Postlaminectomy syndrome, not elsewhere classified: Secondary | ICD-10-CM | POA: Diagnosis not present

## 2018-09-03 DIAGNOSIS — M62838 Other muscle spasm: Secondary | ICD-10-CM | POA: Diagnosis not present

## 2018-09-03 DIAGNOSIS — M6281 Muscle weakness (generalized): Secondary | ICD-10-CM | POA: Diagnosis not present

## 2018-09-03 DIAGNOSIS — G894 Chronic pain syndrome: Secondary | ICD-10-CM | POA: Diagnosis not present

## 2018-09-03 DIAGNOSIS — F329 Major depressive disorder, single episode, unspecified: Secondary | ICD-10-CM | POA: Insufficient documentation

## 2018-09-03 DIAGNOSIS — M4326 Fusion of spine, lumbar region: Secondary | ICD-10-CM | POA: Diagnosis not present

## 2018-09-03 DIAGNOSIS — N400 Enlarged prostate without lower urinary tract symptoms: Secondary | ICD-10-CM | POA: Diagnosis not present

## 2018-09-03 DIAGNOSIS — E119 Type 2 diabetes mellitus without complications: Secondary | ICD-10-CM | POA: Insufficient documentation

## 2018-09-03 DIAGNOSIS — W19XXXA Unspecified fall, initial encounter: Secondary | ICD-10-CM

## 2018-09-03 DIAGNOSIS — I959 Hypotension, unspecified: Secondary | ICD-10-CM | POA: Diagnosis not present

## 2018-09-03 DIAGNOSIS — E785 Hyperlipidemia, unspecified: Secondary | ICD-10-CM | POA: Diagnosis not present

## 2018-09-03 DIAGNOSIS — Z7401 Bed confinement status: Secondary | ICD-10-CM | POA: Diagnosis not present

## 2018-09-03 DIAGNOSIS — R69 Illness, unspecified: Secondary | ICD-10-CM

## 2018-09-03 MED ORDER — OXYCODONE-ACETAMINOPHEN 5-325 MG PO TABS
2.0000 | ORAL_TABLET | Freq: Once | ORAL | Status: AC
  Administered 2018-09-03: 2 via ORAL
  Filled 2018-09-03: qty 2

## 2018-09-03 NOTE — ED Notes (Signed)
Attempted to obtain blood samples and IV access x 2 with no success. Placed order for IV team.

## 2018-09-03 NOTE — ED Notes (Signed)
[facility] called for pt 

## 2018-09-03 NOTE — ED Provider Notes (Signed)
[facility] DEPT Provider Note   CSN: [id number] Arrival date & time: 09/03/18  1356     History   Chief Complaint Chief Complaint  Patient presents with  . Fall    HPI Jorge Sherman is a 77 y.o. male.  HPI Patient is brought in from nursing home care at [facility].  Reportedly he was being pivoted over to a chair and started to fall, staff assisted the patient to the floor with no actual fall to the floor.  Patient complains of severe pain.  He reports he has pain in the lower back that comes around both sides of his low back.  Reports is burning and severe in quality.  He reports there is no way for him to get comfortable.  Patient reports he has not been ambulatory since his last back surgery which was 9\24.  This was a revision done due to continued problems with pain and numbness in the left leg.  Patient reports that before the surgery, he was walking with a walker.  Patient has been getting high-dose narcotic pain medications.  He reports he has not been out of pain however, since his surgery.  He reports the degree of his pain is intolerable. Past Medical History:  Diagnosis Date  . Chronic pain   . Depressive disorder, not elsewhere classified   . Dizziness and giddiness   . Essential and other specified forms of tremor   . Headache(784.0)   . Leukocytosis, unspecified   . Obstructive sleep apnea (adult) (pediatric)   . Pain in joint, lower leg   . Spinal stenosis, lumbar region, without neurogenic claudication   . Thoracic or lumbosacral neuritis or radiculitis, unspecified   . Type II or unspecified type diabetes mellitus without mention of complication, not stated as uncontrolled   . Unspecified cardiovascular disease   . Unspecified essential hypertension     Patient Active Problem List   Diagnosis Date Noted  . Erectile dysfunction 10/30/2013  . Essential hypertension, benign 10/30/2013  . Coronary  atherosclerosis of native coronary artery 10/30/2013  . Dizziness and giddiness   . Pain in joint, lower leg   . Leukocytosis, unspecified   . Type II or unspecified type diabetes mellitus without mention of complication, not stated as uncontrolled   . Obstructive sleep apnea   . Depressive disorder, not elsewhere classified   . Essential and other specified forms of tremor   . Headache(784.0)   . Spinal stenosis, lumbar region, without neurogenic claudication   . Thoracic or lumbosacral neuritis or radiculitis, unspecified   . Chronic pain     Past Surgical History:  Procedure Laterality Date  . BACK SURGERY    . back surgey     times 3        Home Medications    Prior to Admission medications   Medication Sig Start Date End Date Taking? Authorizing Provider  acetaminophen (TYLENOL) 500 MG tablet Take 1,000 mg by mouth 3 (three) times daily.    Yes [provider]  DULoxetine (CYMBALTA) 30 MG capsule Take 90 mg by mouth daily.   Yes [provider]  fentaNYL (DURAGESIC - DOSED MCG/HR) 25 MCG/HR patch Place 25 mcg onto the skin every 3 (three) days.   Yes [provider]  gabapentin (NEURONTIN) 800 MG tablet Take 800 mg by mouth 4 (four) times daily.   Yes [provider]  HYDROmorphone (DILAUDID) 4 MG tablet Take 4 mg by mouth every 4 (  four) hours as needed for severe pain.   Yes [provider]  hydroxychloroquine (PLAQUENIL) 200 MG tablet Take 200 mg by mouth 2 (two) times daily.   Yes [provider]  Iron-FA-B Cmp-C-Biot-Probiotic (FUSION PLUS) CAPS Take 1 tablet by mouth daily. 02/26/18  Yes [provider]  lactose free nutrition (BOOST) LIQD Take 237 mLs by mouth 3 (three) times daily with meals.   Yes [provider]  linaclotide (LINZESS) 145 MCG CAPS capsule Take 145 mcg by mouth daily.  11/26/17  Yes [provider]  LORazepam (ATIVAN) 2 MG tablet Take 2 mg by mouth every 8 (eight) hours as  needed for anxiety.   Yes [provider]  metaxalone (SKELAXIN) 800 MG tablet Take 800 mg by mouth 3 (three) times daily. 10/27/17 10/27/18 Yes [provider]  olmesartan (BENICAR) 40 MG tablet Take 20 mg by mouth daily.    Yes [provider]  omeprazole (PRILOSEC) 40 MG capsule Take 40 mg by mouth daily.   Yes [provider]  predniSONE (STERAPRED UNI-PAK 21 TAB) 10 MG (21) TBPK tablet Take 6 tabs by mouth daily  for 2 days, then 5 tabs for 2 days, then 4 tabs for 2 days, then 3 tabs for 2 days, 2 tabs for 2 days, then 1 tab by mouth daily for 2 days 08/24/18  Yes Isla Pence, MD  rosuvastatin (CRESTOR) 20 MG tablet Take 20 mg by mouth every evening.   Yes [provider]  tamsulosin (FLOMAX) 0.4 MG CAPS capsule Take 0.4 mg by mouth daily.   Yes [provider]    Family History Family History  Problem Relation Age of Onset  . Cancer Father   . Heart disease Father   . Stroke Mother   . Diabetes Mother     Social History Social History   Tobacco Use  . Smoking status: Unknown If Ever Smoked  Substance Use Topics  . Alcohol use: No  . Drug use: No     Allergies   Iohexol; Fentanyl; and Iodine   Review of Systems Review of Systems 10 Systems reviewed and are negative for acute change except as noted in the HPI.  Physical Exam Updated Vital Signs BP 104/61   Pulse (!) 104   Temp 97.9 F (36.6 C) (Oral)   Resp 15   Ht 5\' 10"  (1.778 m)   SpO2 98%   BMI 36.73 kg/m   Physical Exam  Constitutional:  Upon entering the room for the first time, patient was sound asleep, snoring loudly.  He has morbid obesity.  Despite snoring and central obesity, patient's oxygen saturation was 97-100.  with moderately loud verbal stimulation the patient awakened spontaneously.  He immediately began complaining of severe pain.  HENT:  Head: Normocephalic and atraumatic.  Mouth/Throat: Oropharynx is clear and moist.  Eyes: EOM are  normal.  Neck: Neck supple.  Cardiovascular: Normal rate, regular rhythm, normal heart sounds and intact distal pulses.  Pulmonary/Chest: Effort normal and breath sounds normal.  Abdominal: Soft. He exhibits no distension. There is no tenderness. There is no guarding.  Musculoskeletal:  Bilateral lower extremities no peripheral edema.  Calves are soft nontender.  Patient's feet are warm and dry.  He has varicosities but no venous stasis.  Dorsalis pedis pulses were difficult to palpate, posterior tibialis are palpable.  Dorsalis pedis pulses are present with Doppler ultrasound.  No mottling or discoloration of the feet.  No wounds or ulcers. No bruising of that  hips or lower extremities.  Neurological:  Once awake, the patient is alert and situationally oriented.  Can move both upper extremities at will.  He can also move lower extremities although combination of body habitus and pain significantly limits the patient's mobility.  Skin: Skin is warm and dry.     ED Treatments / Results  Labs (all labs ordered are listed, but only abnormal results are displayed) Labs Reviewed - No data to display  EKG None  Radiology No results found.  Procedures Procedures (including critical care time)  Medications Ordered in ED Medications  oxyCODONE-acetaminophen (PERCOCET/ROXICET) 5-325 MG per tablet 2 tablet (has no administration in time range)     Initial Impression / Assessment and Plan / ED Course  I have reviewed the triage vital signs and the nursing notes.  Pertinent labs & imaging results that were available during my care of the patient were reviewed by me and considered in my medical decision making (see chart for details).    Patient has had chronic pain.  His procedure was approximately 3 weeks ago.  Patient shows no signs of being toxic or any apparent change acutely aside from the reported fall which sounds nontraumatic.  Patient was seen in the emergency department 10 days  ago with similar presentation.  MRI was done at that time.  Patient has multiple chronic findings with known spinal stenosis and historical radiculopathies.  At this time, and I did not feel that repeat imaging would be helpful.  No sign of vascular compromise.  Distal pulses are present both dorsalis pedis and posterior tibial bilaterally.  No immediate signs of DVT, patient does not have swelling or calf tenderness.  I most suspect the patient's pain is chronic.  I have checked in on the patient several times and each time I enter the room, he has been sleeping soundly and snoring loudly, urgently, he is adequately maintaining oxygen saturations rater than 95% despite the risk for hypoventilation due to obesity.  I did not feel I could administer additional pain medication to the patient even though he repeatedly requested it every time he was awakened.  Once medications seem to clear and patient remained awake, he had continuous request from nursing staff for additional pain medication.  He reported that he had not been seen or evaluated and if he was not going to get additional pain medication he wanted to go back home.  At this time, as the patient is awake and alert, we will administer 2 Percocet but I did not feel comfortable administering the amount of Dilaudid that he is prescribed for pain control given the amount of somnolence he was exhibiting earlier.  I had ordered some screening labs, however as patient wishes to leave and he did have lab work done within the past 10 days and objectively no clinical reason to suspect acute changes, patient will be discharged back to nursing home care.  Recommendation is for follow-up with neurosurgery ASAP.  Final Clinical Impressions(s) / ED Diagnoses   Final diagnoses:  Fall, initial encounter  Chronic bilateral low back pain without sciatica  Severe comorbid illness    ED Discharge Orders    None       Charlesetta Shanks, MD 09/03/18 2014

## 2018-09-03 NOTE — ED Notes (Signed)
Pt stated he wants to go back to the nursing home because "they will give me the pain medicine that I need!" MD notified.

## 2018-09-03 NOTE — ED Notes (Signed)
Patient complains of pain 10/10, MD was notified about pain. MD stated no pain meds will be given at this time.

## 2018-09-03 NOTE — ED Notes (Signed)
Bed: WA06 Expected date:  Expected time:  Means of arrival:  Comments: EMS-chronic pain-room 6

## 2018-09-03 NOTE — Discharge Instructions (Addendum)
1.  It is very important that you have a follow-up appointment with your spine surgeon within the next several days. 2.  Continue to use pain medications as prescribed but try to decrease amount and frequency as soon as possible.

## 2018-09-03 NOTE — ED Triage Notes (Signed)
Patient arrived by EMS from [facility]. Pt was pivoted over to a chair and pt started to fall,staff assisted pt to the floor, pt didn't actually fall per EMS. Pt has chronic pain. Hx of opioid dependance. Pt has a fentanyl patch, hydrocodone, and diluadid at 1230 at .  BP 110/50, SpO2 97 %, HR 100. Pt had back surgery a few weeks ago and has back brace on per EMS.

## 2018-09-10 DIAGNOSIS — K219 Gastro-esophageal reflux disease without esophagitis: Secondary | ICD-10-CM | POA: Diagnosis not present

## 2018-09-10 DIAGNOSIS — N400 Enlarged prostate without lower urinary tract symptoms: Secondary | ICD-10-CM | POA: Diagnosis not present

## 2018-09-10 DIAGNOSIS — I2510 Atherosclerotic heart disease of native coronary artery without angina pectoris: Secondary | ICD-10-CM | POA: Diagnosis not present

## 2018-09-10 DIAGNOSIS — M961 Postlaminectomy syndrome, not elsewhere classified: Secondary | ICD-10-CM | POA: Diagnosis not present

## 2018-09-10 DIAGNOSIS — G894 Chronic pain syndrome: Secondary | ICD-10-CM | POA: Diagnosis not present

## 2018-09-10 DIAGNOSIS — D500 Iron deficiency anemia secondary to blood loss (chronic): Secondary | ICD-10-CM | POA: Diagnosis not present

## 2018-09-10 DIAGNOSIS — E785 Hyperlipidemia, unspecified: Secondary | ICD-10-CM | POA: Diagnosis not present

## 2018-09-10 DIAGNOSIS — I10 Essential (primary) hypertension: Secondary | ICD-10-CM | POA: Diagnosis not present

## 2018-09-10 DIAGNOSIS — M62838 Other muscle spasm: Secondary | ICD-10-CM | POA: Diagnosis not present

## 2018-09-10 DIAGNOSIS — M6281 Muscle weakness (generalized): Secondary | ICD-10-CM | POA: Diagnosis not present

## 2018-09-10 DIAGNOSIS — R339 Retention of urine, unspecified: Secondary | ICD-10-CM | POA: Diagnosis not present

## 2018-09-10 DIAGNOSIS — M4326 Fusion of spine, lumbar region: Secondary | ICD-10-CM | POA: Diagnosis not present

## 2018-09-14 DIAGNOSIS — G4733 Obstructive sleep apnea (adult) (pediatric): Secondary | ICD-10-CM | POA: Diagnosis not present

## 2018-09-14 DIAGNOSIS — Z888 Allergy status to other drugs, medicaments and biological substances status: Secondary | ICD-10-CM | POA: Diagnosis not present

## 2018-09-14 DIAGNOSIS — R0600 Dyspnea, unspecified: Secondary | ICD-10-CM | POA: Diagnosis not present

## 2018-09-14 DIAGNOSIS — Z969 Presence of functional implant, unspecified: Secondary | ICD-10-CM | POA: Diagnosis not present

## 2018-09-14 DIAGNOSIS — R531 Weakness: Secondary | ICD-10-CM | POA: Diagnosis not present

## 2018-09-14 DIAGNOSIS — Z79899 Other long term (current) drug therapy: Secondary | ICD-10-CM | POA: Diagnosis not present

## 2018-09-14 DIAGNOSIS — I10 Essential (primary) hypertension: Secondary | ICD-10-CM | POA: Diagnosis not present

## 2018-09-14 DIAGNOSIS — Z981 Arthrodesis status: Secondary | ICD-10-CM | POA: Diagnosis not present

## 2018-09-14 DIAGNOSIS — Z9861 Coronary angioplasty status: Secondary | ICD-10-CM | POA: Diagnosis not present

## 2018-09-14 DIAGNOSIS — Z7401 Bed confinement status: Secondary | ICD-10-CM | POA: Diagnosis not present

## 2018-09-14 DIAGNOSIS — R110 Nausea: Secondary | ICD-10-CM | POA: Diagnosis not present

## 2018-09-14 DIAGNOSIS — I2510 Atherosclerotic heart disease of native coronary artery without angina pectoris: Secondary | ICD-10-CM | POA: Diagnosis not present

## 2018-09-14 DIAGNOSIS — E1169 Type 2 diabetes mellitus with other specified complication: Secondary | ICD-10-CM | POA: Diagnosis not present

## 2018-09-14 DIAGNOSIS — E785 Hyperlipidemia, unspecified: Secondary | ICD-10-CM | POA: Diagnosis not present

## 2018-09-14 DIAGNOSIS — R52 Pain, unspecified: Secondary | ICD-10-CM | POA: Diagnosis not present

## 2018-09-14 DIAGNOSIS — K219 Gastro-esophageal reflux disease without esophagitis: Secondary | ICD-10-CM | POA: Diagnosis not present

## 2018-09-14 DIAGNOSIS — Z87891 Personal history of nicotine dependence: Secondary | ICD-10-CM | POA: Diagnosis not present

## 2018-09-14 DIAGNOSIS — M2550 Pain in unspecified joint: Secondary | ICD-10-CM | POA: Diagnosis not present

## 2018-09-14 DIAGNOSIS — R0602 Shortness of breath: Secondary | ICD-10-CM | POA: Diagnosis not present

## 2018-09-14 DIAGNOSIS — M5489 Other dorsalgia: Secondary | ICD-10-CM | POA: Diagnosis not present

## 2018-09-17 DIAGNOSIS — Z888 Allergy status to other drugs, medicaments and biological substances status: Secondary | ICD-10-CM | POA: Diagnosis not present

## 2018-09-17 DIAGNOSIS — R52 Pain, unspecified: Secondary | ICD-10-CM | POA: Diagnosis not present

## 2018-09-17 DIAGNOSIS — I10 Essential (primary) hypertension: Secondary | ICD-10-CM | POA: Diagnosis not present

## 2018-09-17 DIAGNOSIS — Z87891 Personal history of nicotine dependence: Secondary | ICD-10-CM | POA: Diagnosis not present

## 2018-09-17 DIAGNOSIS — N3090 Cystitis, unspecified without hematuria: Secondary | ICD-10-CM | POA: Diagnosis not present

## 2018-09-17 DIAGNOSIS — N400 Enlarged prostate without lower urinary tract symptoms: Secondary | ICD-10-CM | POA: Diagnosis not present

## 2018-09-17 DIAGNOSIS — N3000 Acute cystitis without hematuria: Secondary | ICD-10-CM | POA: Diagnosis not present

## 2018-09-17 DIAGNOSIS — R5381 Other malaise: Secondary | ICD-10-CM | POA: Diagnosis not present

## 2018-09-17 DIAGNOSIS — Z7401 Bed confinement status: Secondary | ICD-10-CM | POA: Diagnosis not present

## 2018-09-17 DIAGNOSIS — K219 Gastro-esophageal reflux disease without esophagitis: Secondary | ICD-10-CM | POA: Diagnosis not present

## 2018-09-17 DIAGNOSIS — Z91041 Radiographic dye allergy status: Secondary | ICD-10-CM | POA: Diagnosis not present

## 2018-09-17 DIAGNOSIS — I2510 Atherosclerotic heart disease of native coronary artery without angina pectoris: Secondary | ICD-10-CM | POA: Diagnosis not present

## 2018-09-17 DIAGNOSIS — Z981 Arthrodesis status: Secondary | ICD-10-CM | POA: Diagnosis not present

## 2018-09-17 DIAGNOSIS — Z955 Presence of coronary angioplasty implant and graft: Secondary | ICD-10-CM | POA: Diagnosis not present

## 2018-09-17 DIAGNOSIS — E785 Hyperlipidemia, unspecified: Secondary | ICD-10-CM | POA: Diagnosis not present

## 2018-09-17 DIAGNOSIS — Z79899 Other long term (current) drug therapy: Secondary | ICD-10-CM | POA: Diagnosis not present

## 2018-09-17 DIAGNOSIS — D649 Anemia, unspecified: Secondary | ICD-10-CM | POA: Diagnosis not present

## 2018-09-17 DIAGNOSIS — M2550 Pain in unspecified joint: Secondary | ICD-10-CM | POA: Diagnosis not present

## 2018-09-17 DIAGNOSIS — Z9682 Presence of neurostimulator: Secondary | ICD-10-CM | POA: Diagnosis not present

## 2018-09-17 DIAGNOSIS — K828 Other specified diseases of gallbladder: Secondary | ICD-10-CM | POA: Diagnosis not present

## 2018-09-17 DIAGNOSIS — K629 Disease of anus and rectum, unspecified: Secondary | ICD-10-CM | POA: Diagnosis not present

## 2018-09-17 DIAGNOSIS — R1084 Generalized abdominal pain: Secondary | ICD-10-CM | POA: Diagnosis not present

## 2018-09-17 DIAGNOSIS — Z961 Presence of intraocular lens: Secondary | ICD-10-CM | POA: Diagnosis not present

## 2018-09-17 DIAGNOSIS — Z9841 Cataract extraction status, right eye: Secondary | ICD-10-CM | POA: Diagnosis not present

## 2018-09-17 DIAGNOSIS — R58 Hemorrhage, not elsewhere classified: Secondary | ICD-10-CM | POA: Diagnosis not present

## 2018-09-28 DIAGNOSIS — R197 Diarrhea, unspecified: Secondary | ICD-10-CM | POA: Diagnosis not present

## 2018-09-28 DIAGNOSIS — R531 Weakness: Secondary | ICD-10-CM | POA: Diagnosis not present

## 2018-09-28 DIAGNOSIS — K8020 Calculus of gallbladder without cholecystitis without obstruction: Secondary | ICD-10-CM | POA: Diagnosis not present

## 2018-09-28 DIAGNOSIS — I10 Essential (primary) hypertension: Secondary | ICD-10-CM | POA: Diagnosis not present

## 2018-09-28 DIAGNOSIS — K5730 Diverticulosis of large intestine without perforation or abscess without bleeding: Secondary | ICD-10-CM | POA: Diagnosis not present

## 2018-09-28 DIAGNOSIS — R109 Unspecified abdominal pain: Secondary | ICD-10-CM | POA: Diagnosis not present

## 2018-09-28 DIAGNOSIS — K828 Other specified diseases of gallbladder: Secondary | ICD-10-CM | POA: Diagnosis not present

## 2018-09-28 DIAGNOSIS — K8010 Calculus of gallbladder with chronic cholecystitis without obstruction: Secondary | ICD-10-CM | POA: Diagnosis not present

## 2018-09-28 DIAGNOSIS — R0902 Hypoxemia: Secondary | ICD-10-CM | POA: Diagnosis not present

## 2018-09-28 DIAGNOSIS — K529 Noninfective gastroenteritis and colitis, unspecified: Secondary | ICD-10-CM | POA: Diagnosis not present

## 2018-09-28 DIAGNOSIS — E860 Dehydration: Secondary | ICD-10-CM | POA: Diagnosis not present

## 2018-09-28 DIAGNOSIS — I959 Hypotension, unspecified: Secondary | ICD-10-CM | POA: Diagnosis not present

## 2018-09-28 DIAGNOSIS — K760 Fatty (change of) liver, not elsewhere classified: Secondary | ICD-10-CM | POA: Diagnosis not present

## 2018-09-29 DIAGNOSIS — R933 Abnormal findings on diagnostic imaging of other parts of digestive tract: Secondary | ICD-10-CM | POA: Diagnosis not present

## 2018-09-29 DIAGNOSIS — K529 Noninfective gastroenteritis and colitis, unspecified: Secondary | ICD-10-CM | POA: Diagnosis not present

## 2018-09-29 DIAGNOSIS — E119 Type 2 diabetes mellitus without complications: Secondary | ICD-10-CM | POA: Diagnosis not present

## 2018-09-29 DIAGNOSIS — R1011 Right upper quadrant pain: Secondary | ICD-10-CM | POA: Diagnosis not present

## 2018-09-29 DIAGNOSIS — I10 Essential (primary) hypertension: Secondary | ICD-10-CM | POA: Diagnosis not present

## 2018-09-29 DIAGNOSIS — R197 Diarrhea, unspecified: Secondary | ICD-10-CM | POA: Diagnosis not present

## 2018-09-29 DIAGNOSIS — G894 Chronic pain syndrome: Secondary | ICD-10-CM | POA: Diagnosis not present

## 2018-09-29 DIAGNOSIS — D72829 Elevated white blood cell count, unspecified: Secondary | ICD-10-CM | POA: Diagnosis not present

## 2018-09-29 DIAGNOSIS — R1013 Epigastric pain: Secondary | ICD-10-CM | POA: Diagnosis not present

## 2018-09-29 DIAGNOSIS — G4733 Obstructive sleep apnea (adult) (pediatric): Secondary | ICD-10-CM | POA: Diagnosis not present

## 2018-09-30 DIAGNOSIS — R110 Nausea: Secondary | ICD-10-CM | POA: Diagnosis not present

## 2018-09-30 DIAGNOSIS — R410 Disorientation, unspecified: Secondary | ICD-10-CM | POA: Diagnosis not present

## 2018-09-30 DIAGNOSIS — K529 Noninfective gastroenteritis and colitis, unspecified: Secondary | ICD-10-CM | POA: Diagnosis not present

## 2018-09-30 DIAGNOSIS — R197 Diarrhea, unspecified: Secondary | ICD-10-CM | POA: Diagnosis not present

## 2018-09-30 DIAGNOSIS — R1013 Epigastric pain: Secondary | ICD-10-CM | POA: Diagnosis not present

## 2018-10-01 DIAGNOSIS — R1013 Epigastric pain: Secondary | ICD-10-CM | POA: Diagnosis not present

## 2018-10-01 DIAGNOSIS — K529 Noninfective gastroenteritis and colitis, unspecified: Secondary | ICD-10-CM | POA: Diagnosis not present

## 2018-10-01 DIAGNOSIS — R112 Nausea with vomiting, unspecified: Secondary | ICD-10-CM | POA: Diagnosis not present

## 2018-10-01 DIAGNOSIS — K6389 Other specified diseases of intestine: Secondary | ICD-10-CM | POA: Diagnosis not present

## 2018-10-01 DIAGNOSIS — R197 Diarrhea, unspecified: Secondary | ICD-10-CM | POA: Diagnosis not present

## 2018-10-02 DIAGNOSIS — K5900 Constipation, unspecified: Secondary | ICD-10-CM | POA: Diagnosis not present

## 2018-10-02 DIAGNOSIS — G894 Chronic pain syndrome: Secondary | ICD-10-CM | POA: Diagnosis not present

## 2018-10-02 DIAGNOSIS — R197 Diarrhea, unspecified: Secondary | ICD-10-CM | POA: Diagnosis not present

## 2018-10-02 DIAGNOSIS — E1165 Type 2 diabetes mellitus with hyperglycemia: Secondary | ICD-10-CM | POA: Diagnosis not present

## 2018-10-02 DIAGNOSIS — E871 Hypo-osmolality and hyponatremia: Secondary | ICD-10-CM | POA: Diagnosis not present

## 2018-10-02 DIAGNOSIS — R1013 Epigastric pain: Secondary | ICD-10-CM | POA: Diagnosis not present

## 2018-10-02 DIAGNOSIS — E860 Dehydration: Secondary | ICD-10-CM | POA: Diagnosis not present

## 2018-10-02 DIAGNOSIS — K529 Noninfective gastroenteritis and colitis, unspecified: Secondary | ICD-10-CM | POA: Diagnosis not present

## 2018-10-02 DIAGNOSIS — G92 Toxic encephalopathy: Secondary | ICD-10-CM | POA: Diagnosis not present

## 2018-10-02 DIAGNOSIS — I10 Essential (primary) hypertension: Secondary | ICD-10-CM | POA: Diagnosis not present

## 2018-10-03 DIAGNOSIS — R918 Other nonspecific abnormal finding of lung field: Secondary | ICD-10-CM | POA: Diagnosis not present

## 2018-10-03 DIAGNOSIS — E860 Dehydration: Secondary | ICD-10-CM | POA: Diagnosis not present

## 2018-10-03 DIAGNOSIS — G92 Toxic encephalopathy: Secondary | ICD-10-CM | POA: Diagnosis not present

## 2018-10-03 DIAGNOSIS — R0602 Shortness of breath: Secondary | ICD-10-CM | POA: Diagnosis not present

## 2018-10-03 DIAGNOSIS — R197 Diarrhea, unspecified: Secondary | ICD-10-CM | POA: Diagnosis not present

## 2018-10-03 DIAGNOSIS — R1013 Epigastric pain: Secondary | ICD-10-CM | POA: Diagnosis not present

## 2018-10-03 DIAGNOSIS — K529 Noninfective gastroenteritis and colitis, unspecified: Secondary | ICD-10-CM | POA: Diagnosis not present

## 2018-10-03 DIAGNOSIS — K5900 Constipation, unspecified: Secondary | ICD-10-CM | POA: Diagnosis not present

## 2018-10-03 DIAGNOSIS — E1165 Type 2 diabetes mellitus with hyperglycemia: Secondary | ICD-10-CM | POA: Diagnosis not present

## 2018-10-03 DIAGNOSIS — I10 Essential (primary) hypertension: Secondary | ICD-10-CM | POA: Diagnosis not present

## 2018-10-03 DIAGNOSIS — G894 Chronic pain syndrome: Secondary | ICD-10-CM | POA: Diagnosis not present

## 2018-10-03 DIAGNOSIS — E871 Hypo-osmolality and hyponatremia: Secondary | ICD-10-CM | POA: Diagnosis not present

## 2018-10-04 DIAGNOSIS — G92 Toxic encephalopathy: Secondary | ICD-10-CM | POA: Diagnosis not present

## 2018-10-04 DIAGNOSIS — G894 Chronic pain syndrome: Secondary | ICD-10-CM | POA: Diagnosis not present

## 2018-10-04 DIAGNOSIS — E860 Dehydration: Secondary | ICD-10-CM | POA: Diagnosis not present

## 2018-10-04 DIAGNOSIS — R197 Diarrhea, unspecified: Secondary | ICD-10-CM | POA: Diagnosis not present

## 2018-10-04 DIAGNOSIS — R1013 Epigastric pain: Secondary | ICD-10-CM | POA: Diagnosis not present

## 2018-10-04 DIAGNOSIS — K529 Noninfective gastroenteritis and colitis, unspecified: Secondary | ICD-10-CM | POA: Diagnosis not present

## 2018-10-04 DIAGNOSIS — E871 Hypo-osmolality and hyponatremia: Secondary | ICD-10-CM | POA: Diagnosis not present

## 2018-10-04 DIAGNOSIS — I10 Essential (primary) hypertension: Secondary | ICD-10-CM | POA: Diagnosis not present

## 2018-10-04 DIAGNOSIS — K5900 Constipation, unspecified: Secondary | ICD-10-CM | POA: Diagnosis not present

## 2018-10-04 DIAGNOSIS — E1165 Type 2 diabetes mellitus with hyperglycemia: Secondary | ICD-10-CM | POA: Diagnosis not present

## 2018-10-05 DIAGNOSIS — K529 Noninfective gastroenteritis and colitis, unspecified: Secondary | ICD-10-CM | POA: Diagnosis not present

## 2018-10-05 DIAGNOSIS — E871 Hypo-osmolality and hyponatremia: Secondary | ICD-10-CM | POA: Diagnosis not present

## 2018-10-05 DIAGNOSIS — G894 Chronic pain syndrome: Secondary | ICD-10-CM | POA: Diagnosis not present

## 2018-10-05 DIAGNOSIS — R197 Diarrhea, unspecified: Secondary | ICD-10-CM | POA: Diagnosis not present

## 2018-10-05 DIAGNOSIS — E1165 Type 2 diabetes mellitus with hyperglycemia: Secondary | ICD-10-CM | POA: Diagnosis not present

## 2018-10-05 DIAGNOSIS — G92 Toxic encephalopathy: Secondary | ICD-10-CM | POA: Diagnosis not present

## 2018-10-05 DIAGNOSIS — E860 Dehydration: Secondary | ICD-10-CM | POA: Diagnosis not present

## 2018-10-05 DIAGNOSIS — I10 Essential (primary) hypertension: Secondary | ICD-10-CM | POA: Diagnosis not present

## 2018-10-06 DIAGNOSIS — E871 Hypo-osmolality and hyponatremia: Secondary | ICD-10-CM | POA: Diagnosis not present

## 2018-10-06 DIAGNOSIS — Z800 Family history of malignant neoplasm of digestive organs: Secondary | ICD-10-CM | POA: Diagnosis not present

## 2018-10-06 DIAGNOSIS — R197 Diarrhea, unspecified: Secondary | ICD-10-CM | POA: Diagnosis not present

## 2018-10-06 DIAGNOSIS — K529 Noninfective gastroenteritis and colitis, unspecified: Secondary | ICD-10-CM | POA: Diagnosis not present

## 2018-10-06 DIAGNOSIS — E860 Dehydration: Secondary | ICD-10-CM | POA: Diagnosis not present

## 2018-10-06 DIAGNOSIS — Z1211 Encounter for screening for malignant neoplasm of colon: Secondary | ICD-10-CM | POA: Diagnosis not present

## 2018-10-06 DIAGNOSIS — G92 Toxic encephalopathy: Secondary | ICD-10-CM | POA: Diagnosis not present

## 2018-10-06 DIAGNOSIS — I10 Essential (primary) hypertension: Secondary | ICD-10-CM | POA: Diagnosis not present

## 2018-10-06 DIAGNOSIS — E1165 Type 2 diabetes mellitus with hyperglycemia: Secondary | ICD-10-CM | POA: Diagnosis not present

## 2018-10-06 DIAGNOSIS — K6389 Other specified diseases of intestine: Secondary | ICD-10-CM | POA: Diagnosis not present

## 2018-10-06 DIAGNOSIS — G894 Chronic pain syndrome: Secondary | ICD-10-CM | POA: Diagnosis not present

## 2018-10-06 DIAGNOSIS — D120 Benign neoplasm of cecum: Secondary | ICD-10-CM | POA: Diagnosis not present

## 2018-10-07 DIAGNOSIS — E860 Dehydration: Secondary | ICD-10-CM | POA: Diagnosis not present

## 2018-10-07 DIAGNOSIS — I10 Essential (primary) hypertension: Secondary | ICD-10-CM | POA: Diagnosis not present

## 2018-10-07 DIAGNOSIS — E1165 Type 2 diabetes mellitus with hyperglycemia: Secondary | ICD-10-CM | POA: Diagnosis not present

## 2018-10-07 DIAGNOSIS — E871 Hypo-osmolality and hyponatremia: Secondary | ICD-10-CM | POA: Diagnosis not present

## 2018-10-07 DIAGNOSIS — G894 Chronic pain syndrome: Secondary | ICD-10-CM | POA: Diagnosis not present

## 2018-10-07 DIAGNOSIS — K529 Noninfective gastroenteritis and colitis, unspecified: Secondary | ICD-10-CM | POA: Diagnosis not present

## 2018-10-07 DIAGNOSIS — G92 Toxic encephalopathy: Secondary | ICD-10-CM | POA: Diagnosis not present

## 2018-10-08 DIAGNOSIS — K529 Noninfective gastroenteritis and colitis, unspecified: Secondary | ICD-10-CM | POA: Diagnosis not present

## 2018-10-08 DIAGNOSIS — M4800 Spinal stenosis, site unspecified: Secondary | ICD-10-CM | POA: Diagnosis not present

## 2018-10-08 DIAGNOSIS — E871 Hypo-osmolality and hyponatremia: Secondary | ICD-10-CM | POA: Diagnosis not present

## 2018-10-08 DIAGNOSIS — E1165 Type 2 diabetes mellitus with hyperglycemia: Secondary | ICD-10-CM | POA: Diagnosis not present

## 2018-10-08 DIAGNOSIS — E860 Dehydration: Secondary | ICD-10-CM | POA: Diagnosis not present

## 2018-10-08 DIAGNOSIS — I10 Essential (primary) hypertension: Secondary | ICD-10-CM | POA: Diagnosis not present

## 2018-10-08 DIAGNOSIS — M25559 Pain in unspecified hip: Secondary | ICD-10-CM | POA: Diagnosis not present

## 2018-10-08 DIAGNOSIS — G92 Toxic encephalopathy: Secondary | ICD-10-CM | POA: Diagnosis not present

## 2018-10-08 DIAGNOSIS — G894 Chronic pain syndrome: Secondary | ICD-10-CM | POA: Diagnosis not present

## 2018-10-26 DIAGNOSIS — M25521 Pain in right elbow: Secondary | ICD-10-CM | POA: Diagnosis not present

## 2018-10-26 DIAGNOSIS — E119 Type 2 diabetes mellitus without complications: Secondary | ICD-10-CM | POA: Diagnosis not present

## 2018-10-26 DIAGNOSIS — E785 Hyperlipidemia, unspecified: Secondary | ICD-10-CM | POA: Diagnosis not present

## 2018-10-26 DIAGNOSIS — Z9682 Presence of neurostimulator: Secondary | ICD-10-CM | POA: Diagnosis not present

## 2018-10-26 DIAGNOSIS — Z87440 Personal history of urinary (tract) infections: Secondary | ICD-10-CM | POA: Diagnosis not present

## 2018-10-26 DIAGNOSIS — N390 Urinary tract infection, site not specified: Secondary | ICD-10-CM | POA: Diagnosis not present

## 2018-10-26 DIAGNOSIS — R109 Unspecified abdominal pain: Secondary | ICD-10-CM | POA: Diagnosis not present

## 2018-10-26 DIAGNOSIS — I10 Essential (primary) hypertension: Secondary | ICD-10-CM | POA: Diagnosis not present

## 2018-10-26 DIAGNOSIS — M810 Age-related osteoporosis without current pathological fracture: Secondary | ICD-10-CM | POA: Diagnosis not present

## 2018-10-26 DIAGNOSIS — H2512 Age-related nuclear cataract, left eye: Secondary | ICD-10-CM | POA: Diagnosis not present

## 2018-10-26 DIAGNOSIS — Z79899 Other long term (current) drug therapy: Secondary | ICD-10-CM | POA: Diagnosis not present

## 2018-10-26 DIAGNOSIS — Z955 Presence of coronary angioplasty implant and graft: Secondary | ICD-10-CM | POA: Diagnosis not present

## 2018-10-26 DIAGNOSIS — Z862 Personal history of diseases of the blood and blood-forming organs and certain disorders involving the immune mechanism: Secondary | ICD-10-CM | POA: Diagnosis not present

## 2018-10-26 DIAGNOSIS — Z961 Presence of intraocular lens: Secondary | ICD-10-CM | POA: Diagnosis not present

## 2018-10-26 DIAGNOSIS — Z8619 Personal history of other infectious and parasitic diseases: Secondary | ICD-10-CM | POA: Diagnosis not present

## 2018-10-26 DIAGNOSIS — I2510 Atherosclerotic heart disease of native coronary artery without angina pectoris: Secondary | ICD-10-CM | POA: Diagnosis not present

## 2018-10-26 DIAGNOSIS — N400 Enlarged prostate without lower urinary tract symptoms: Secondary | ICD-10-CM | POA: Diagnosis not present

## 2018-10-26 DIAGNOSIS — R600 Localized edema: Secondary | ICD-10-CM | POA: Diagnosis not present

## 2018-10-26 DIAGNOSIS — Z981 Arthrodesis status: Secondary | ICD-10-CM | POA: Diagnosis not present

## 2018-10-26 DIAGNOSIS — Z87891 Personal history of nicotine dependence: Secondary | ICD-10-CM | POA: Diagnosis not present

## 2018-10-26 DIAGNOSIS — G4733 Obstructive sleep apnea (adult) (pediatric): Secondary | ICD-10-CM | POA: Diagnosis not present

## 2018-10-26 DIAGNOSIS — R1013 Epigastric pain: Secondary | ICD-10-CM | POA: Diagnosis not present

## 2018-10-26 DIAGNOSIS — Z888 Allergy status to other drugs, medicaments and biological substances status: Secondary | ICD-10-CM | POA: Diagnosis not present

## 2018-10-26 DIAGNOSIS — R0989 Other specified symptoms and signs involving the circulatory and respiratory systems: Secondary | ICD-10-CM | POA: Diagnosis not present

## 2018-10-26 DIAGNOSIS — Z9889 Other specified postprocedural states: Secondary | ICD-10-CM | POA: Diagnosis not present

## 2018-10-26 DIAGNOSIS — R300 Dysuria: Secondary | ICD-10-CM | POA: Diagnosis not present

## 2018-10-26 DIAGNOSIS — Z79891 Long term (current) use of opiate analgesic: Secondary | ICD-10-CM | POA: Diagnosis not present

## 2018-10-29 DIAGNOSIS — Z515 Encounter for palliative care: Secondary | ICD-10-CM | POA: Diagnosis not present

## 2018-10-29 DIAGNOSIS — G894 Chronic pain syndrome: Secondary | ICD-10-CM | POA: Diagnosis not present

## 2018-10-29 DIAGNOSIS — Z79899 Other long term (current) drug therapy: Secondary | ICD-10-CM | POA: Diagnosis not present

## 2018-10-29 DIAGNOSIS — M25561 Pain in right knee: Secondary | ICD-10-CM | POA: Diagnosis not present

## 2018-10-29 DIAGNOSIS — M25512 Pain in left shoulder: Secondary | ICD-10-CM | POA: Diagnosis not present

## 2018-10-29 DIAGNOSIS — M545 Low back pain: Secondary | ICD-10-CM | POA: Diagnosis not present

## 2018-10-29 DIAGNOSIS — Z5181 Encounter for therapeutic drug level monitoring: Secondary | ICD-10-CM | POA: Diagnosis not present

## 2018-11-05 DIAGNOSIS — M47817 Spondylosis without myelopathy or radiculopathy, lumbosacral region: Secondary | ICD-10-CM | POA: Diagnosis not present

## 2018-11-10 DIAGNOSIS — L308 Other specified dermatitis: Secondary | ICD-10-CM | POA: Diagnosis not present

## 2018-11-10 DIAGNOSIS — N390 Urinary tract infection, site not specified: Secondary | ICD-10-CM | POA: Diagnosis not present

## 2018-11-10 DIAGNOSIS — S8001XA Contusion of right knee, initial encounter: Secondary | ICD-10-CM | POA: Diagnosis not present

## 2018-11-10 DIAGNOSIS — M25561 Pain in right knee: Secondary | ICD-10-CM | POA: Diagnosis not present

## 2018-11-10 DIAGNOSIS — R339 Retention of urine, unspecified: Secondary | ICD-10-CM | POA: Diagnosis not present

## 2018-11-10 DIAGNOSIS — M1711 Unilateral primary osteoarthritis, right knee: Secondary | ICD-10-CM | POA: Diagnosis not present

## 2018-11-10 DIAGNOSIS — R32 Unspecified urinary incontinence: Secondary | ICD-10-CM | POA: Diagnosis not present

## 2018-11-10 DIAGNOSIS — B372 Candidiasis of skin and nail: Secondary | ICD-10-CM | POA: Diagnosis not present

## 2018-11-10 DIAGNOSIS — R300 Dysuria: Secondary | ICD-10-CM | POA: Diagnosis not present

## 2018-11-10 DIAGNOSIS — M11261 Other chondrocalcinosis, right knee: Secondary | ICD-10-CM | POA: Diagnosis not present

## 2018-11-20 DIAGNOSIS — K5900 Constipation, unspecified: Secondary | ICD-10-CM | POA: Diagnosis not present

## 2018-11-20 DIAGNOSIS — E559 Vitamin D deficiency, unspecified: Secondary | ICD-10-CM | POA: Diagnosis not present

## 2018-11-20 DIAGNOSIS — I10 Essential (primary) hypertension: Secondary | ICD-10-CM | POA: Diagnosis not present

## 2018-11-20 DIAGNOSIS — Z23 Encounter for immunization: Secondary | ICD-10-CM | POA: Diagnosis not present

## 2018-11-20 DIAGNOSIS — E785 Hyperlipidemia, unspecified: Secondary | ICD-10-CM | POA: Diagnosis not present

## 2018-11-20 DIAGNOSIS — R7301 Impaired fasting glucose: Secondary | ICD-10-CM | POA: Diagnosis not present

## 2019-04-22 ENCOUNTER — Telehealth: Payer: Self-pay

## 2019-04-22 ENCOUNTER — Ambulatory Visit (INDEPENDENT_AMBULATORY_CARE_PROVIDER_SITE_OTHER): Payer: Medicare Other | Admitting: Infectious Disease

## 2019-04-22 ENCOUNTER — Encounter: Payer: Self-pay | Admitting: Infectious Disease

## 2019-04-22 ENCOUNTER — Other Ambulatory Visit: Payer: Self-pay

## 2019-04-22 VITALS — BP 127/80 | HR 88 | Temp 98.6°F | Wt 256.0 lb

## 2019-04-22 DIAGNOSIS — M48061 Spinal stenosis, lumbar region without neurogenic claudication: Secondary | ICD-10-CM

## 2019-04-22 DIAGNOSIS — M00862 Arthritis due to other bacteria, left knee: Secondary | ICD-10-CM

## 2019-04-22 DIAGNOSIS — G4733 Obstructive sleep apnea (adult) (pediatric): Secondary | ICD-10-CM | POA: Diagnosis not present

## 2019-04-22 DIAGNOSIS — M009 Pyogenic arthritis, unspecified: Secondary | ICD-10-CM | POA: Insufficient documentation

## 2019-04-22 DIAGNOSIS — I2510 Atherosclerotic heart disease of native coronary artery without angina pectoris: Secondary | ICD-10-CM

## 2019-04-22 HISTORY — DX: Pyogenic arthritis, unspecified: M00.9

## 2019-04-22 NOTE — Patient Instructions (Signed)
We will DISCONTINUE your PICC LINE  I would like you  To be seen by Dr Alvan Dame or partner at [facility]  I am scheduling followup appt with me in roughly 2 weeks  We need you to be OFF ALL ANTIBIOTICS  While off anttibiotics your knee --if infected will swell up and develop fluid  As this happens we will be able to have an orthopedist aspirate the joint to determine if it is infected by analyzing white blood cell count, culture  If so you will need surgery to clean out the joint and gain control of the infection  THEN we can treat you with IV antibiotics

## 2019-04-22 NOTE — Telephone Encounter (Signed)
[place], spoke to [place] about new orders Per DR. [facility], to stop IV antibiotics and to pull PICC line.   Joy demonstrated understanding with feedback.  Notified her about pending referral to [facility] and will be called to schedule appointment with surgeon.  Patient was made aware of new orders on 04/22/2019 at office visit.         Lenore Cordia, [place]

## 2019-04-22 NOTE — Progress Notes (Signed)
Subjective:   Reason for Consult: infected left knee  Requesting Physician: Dr. Gerald Dexter    Patient ID: Jorge Sherman, male    DOB: 01/14/1941, 78 y.o.   MRN: [id number]  HPI   [name] is a 78 year old Caucasian male with a history of spinal spondylosis, obstructive sleep apnea, who currently resides in [place].  He relates to me has history of having been admitted to hospital in [place] I believe a [facility] facility after he had had a syncopal event.  He states that after being discharged in the hospital to his skilled nursing facility he developed redness and pain in his left knee.  This appears to been treated with various antibiotics initially with an oral antibiotic in the form of Bactrim.  I also see that he received a couple of days of ertapenem then placed on cefazolin from May 16 through May 22.  He is subsequent been placed on IV vancomycin.  At no point in time was the joint aspirated for cell count differential or culture.  He reportedly was seen by an orthopedist but again I see no documentation and he gives no history that the joint was ever aspirated.  He has been referred to [place] for evaluation and treatment of likely septic left knee.  On exam he has some mild erythema over the left knee which he says is an improvement while he has been on the intravenous antibiotics.  He tells me that when I asked him what the orthopedic surgeon had told him he said "just keep giving the antibiotics.  I had a lengthy expected discussion with the patient today and explained that antibiotics alone do not cure infected joints.  It is absolutely critical that he come off antibiotics so that the fluid in his knee can be appropriately examined and sent off for cell count differential and culture.  I would prefer to have this done 2 weeks after stopping his IV antibiotics.  If the knee is indeed infected he will need a formal incision and debridement in the  operating room or deep cultures can be obtained off antibiotics.  This would then allow [place] to give him a targeted 6-week course of IV antibiotics.    Past Medical History:  Diagnosis Date  . Chronic pain   . Depressive disorder, not elsewhere classified   . Dizziness and giddiness   . Essential and other specified forms of tremor   . Headache(784.0)   . Leukocytosis, unspecified   . Obstructive sleep apnea (adult) (pediatric)   . Pain in joint, lower leg   . Septic joint of left knee joint ([place]) 04/22/2019  . Spinal stenosis, lumbar region, without neurogenic claudication   . Thoracic or lumbosacral neuritis or radiculitis, unspecified   . Type II or unspecified type diabetes mellitus without mention of complication, not stated as uncontrolled   . Unspecified cardiovascular disease   . Unspecified essential hypertension     Past Surgical History:  Procedure Laterality Date  . BACK SURGERY    . back surgey     times 3    Family History  Problem Relation Age of Onset  . Cancer Father   . Heart disease Father   . Stroke Mother   . Diabetes Mother       Social History   Socioeconomic History  . Marital status: Legally Separated    Spouse name: Kennyth Lose  . Number of children: 4  . Years of education: Not on file  .  Highest education level: Not on file  Occupational History    Employer: DISABLED  Social Needs  . Financial resource strain: Not on file  . Food insecurity:    Worry: Not on file    Inability: Not on file  . Transportation needs:    Medical: Not on file    Non-medical: Not on file  Tobacco Use  . Smoking status: Former Research scientist (life sciences)  . Smokeless tobacco: Never Used  Substance and Sexual Activity  . Alcohol use: No  . Drug use: No  . Sexual activity: Not on file  Lifestyle  . Physical activity:    Days per week: Not on file    Minutes per session: Not on file  . Stress: Not on file  Relationships  . Social connections:    Talks on phone: Not on file     Gets together: Not on file    Attends religious service: Not on file    Active member of club or organization: Not on file    Attends meetings of clubs or organizations: Not on file    Relationship status: Not on file  Other Topics Concern  . Not on file  Social History Narrative   ** Merged History Encounter **       Patient lives at home with wife.    Patient has 4 children.          Allergies  Allergen Reactions  . Iohexol Other (See Comments)    A few hives post CT  . Fentanyl Nausea And Vomiting and Other (See Comments)    Patient states that it happened over 30 yrs ago  . Iodine Rash     Current Outpatient Medications:  .  acetaminophen (TYLENOL) 500 MG tablet, Take 1,000 mg by mouth 3 (three) times daily. , Disp: , Rfl:  .  DULoxetine (CYMBALTA) 30 MG capsule, Take 90 mg by mouth daily., Disp: , Rfl:  .  fentaNYL (DURAGESIC - DOSED MCG/HR) 25 MCG/HR patch, Place 25 mcg onto the skin every 3 (three) days., Disp: , Rfl:  .  gabapentin (NEURONTIN) 800 MG tablet, Take 800 mg by mouth 4 (four) times daily., Disp: , Rfl:  .  HYDROmorphone (DILAUDID) 4 MG tablet, Take 4 mg by mouth every 4 (four) hours as needed for severe pain., Disp: , Rfl:  .  hydroxychloroquine (PLAQUENIL) 200 MG tablet, Take 200 mg by mouth 2 (two) times daily., Disp: , Rfl:  .  Iron-FA-B Cmp-C-Biot-Probiotic (FUSION PLUS) CAPS, Take 1 tablet by mouth daily., Disp: , Rfl: 1 .  lactose free nutrition (BOOST) LIQD, Take 237 mLs by mouth 3 (three) times daily with meals., Disp: , Rfl:  .  linaclotide (LINZESS) 145 MCG CAPS capsule, Take 145 mcg by mouth daily. , Disp: , Rfl:  .  LORazepam (ATIVAN) 2 MG tablet, Take 2 mg by mouth every 8 (eight) hours as needed for anxiety., Disp: , Rfl:  .  olmesartan (BENICAR) 40 MG tablet, Take 20 mg by mouth daily. , Disp: , Rfl:  .  omeprazole (PRILOSEC) 40 MG capsule, Take 40 mg by mouth daily., Disp: , Rfl:  .  predniSONE (STERAPRED UNI-PAK 21 TAB) 10 MG (21) TBPK  tablet, Take 6 tabs by mouth daily  for 2 days, then 5 tabs for 2 days, then 4 tabs for 2 days, then 3 tabs for 2 days, 2 tabs for 2 days, then 1 tab by mouth daily for 2 days, Disp: 42 tablet, Rfl: 0 .  rosuvastatin (  CRESTOR) 20 MG tablet, Take 20 mg by mouth every evening., Disp: , Rfl:  .  tamsulosin (FLOMAX) 0.4 MG CAPS capsule, Take 0.4 mg by mouth daily., Disp: , Rfl:    Review of Systems  Constitutional: Negative for chills and fever.  HENT: Negative for congestion and sore throat.   Eyes: Negative for photophobia.  Respiratory: Negative for cough, shortness of breath and wheezing.   Cardiovascular: Negative for chest pain, palpitations and leg swelling.  Gastrointestinal: Negative for abdominal pain, blood in stool, constipation, diarrhea, nausea and vomiting.  Genitourinary: Negative for dysuria, flank pain and hematuria.  Musculoskeletal: Positive for arthralgias and joint swelling. Negative for back pain and myalgias.  Skin: Positive for color change. Negative for rash.  Neurological: Negative for dizziness, weakness and headaches.  Hematological: Does not bruise/bleed easily.  Psychiatric/Behavioral: Negative for agitation, behavioral problems, confusion, decreased concentration, dysphoric mood and suicidal ideas.       Objective:   Physical Exam Constitutional:      General: He is not in acute distress.    Appearance: He is not diaphoretic.  HENT:     Head: Normocephalic and atraumatic.     Right Ear: External ear normal.     Left Ear: External ear normal.     Nose: Nose normal.     Mouth/Throat:     Pharynx: No oropharyngeal exudate.  Eyes:     General: No scleral icterus.    Conjunctiva/sclera: Conjunctivae normal.     Pupils: Pupils are equal, round, and reactive to light.  Neck:     Musculoskeletal: Normal range of motion and neck supple.  Cardiovascular:     Rate and Rhythm: Normal rate and regular rhythm.  Pulmonary:     Effort: Pulmonary effort is normal.  No respiratory distress.     Breath sounds: No wheezing.  Abdominal:     General: Bowel sounds are normal. There is no distension.     Palpations: Abdomen is soft.  Musculoskeletal: Normal range of motion.        General: No tenderness.  Lymphadenopathy:     Cervical: No cervical adenopathy.  Skin:    General: Skin is warm and dry.     Coloration: Skin is not pale.     Findings: Erythema present. No rash.  Neurological:     General: No focal deficit present.     Mental Status: He is alert and oriented to person, place, and time.     Coordination: Coordination normal.  Psychiatric:        Mood and Affect: Mood normal.        Behavior: Behavior normal.        Thought Content: Thought content normal.        Judgment: Judgment normal.    Left knee with erythema and small effusion  PICC line clean       Assessment & Plan:   Rule out Septic knee:  DC PICC and any oral antibiotics  NO antibiotics going forward  Refer to Orthopedics  Preferably have surgery aspirate the joint 2 weeks after he is been off antibiotics and sent for cell count differential and culture.  If the joint indeed is infected he will need formal I&D in the operating room.  We can then obtain cultures and treat him appropriately with IV antibiotics  Also scheduling an appointment with him with me in roughly 2 weeks.  He is not seen orthopedics yet and his knee is indeed worsening I will arrange for  direct admission to the hospital that point in time.  I spent greater than 60 minutes with the patient including greater than 50% of time in face to face counsel of the patient the nature of septic joints and how they are managed with surgery and with antibiotics preferably in a targeted manner and in coordination of his care.

## 2019-05-03 ENCOUNTER — Telehealth: Payer: Self-pay | Admitting: *Deleted

## 2019-05-03 NOTE — Telephone Encounter (Signed)
Changed appointment notes to reflect evisit on 6/6.  Orthopedic notes are in your box in triage.

## 2019-05-03 NOTE — Telephone Encounter (Signed)
Juliann Pulse from [facility] calling to see if patient's follow up appointment with Dr Tommy Medal needs to be in person or over the phone. Per Verdene Rio is competent, able to hear, and able to complete a telephone visit if necessary.  He was seen at [facility] 6/11 (Dr [place] Levo).  Juliann Pulse will send the notes from that visit for review. Patient scheduled 6/16 11:15. We will need to contact Juliann Pulse on Monday to confirm in person (so transportation can be arranged) or telephone visit. Landis Gandy, RN

## 2019-05-03 NOTE — Telephone Encounter (Signed)
That is fine with me. I hope the orthopedist is going to aspirate the knee

## 2019-05-06 NOTE — Telephone Encounter (Signed)
Okay thanks so much [employer]

## 2019-05-07 ENCOUNTER — Ambulatory Visit: Payer: Medicare Other | Admitting: Infectious Disease

## 2019-05-10 ENCOUNTER — Emergency Department (HOSPITAL_COMMUNITY): Payer: Medicare Other

## 2019-05-10 ENCOUNTER — Other Ambulatory Visit: Payer: Self-pay

## 2019-05-10 ENCOUNTER — Encounter (HOSPITAL_COMMUNITY): Payer: Self-pay

## 2019-05-10 ENCOUNTER — Emergency Department (HOSPITAL_COMMUNITY)
Admission: EM | Admit: 2019-05-10 | Discharge: 2019-05-11 | Disposition: A | Discharge status: Home / Self Care | Payer: Medicare Other | Attending: Emergency Medicine | Admitting: Emergency Medicine

## 2019-05-10 DIAGNOSIS — I2510 Atherosclerotic heart disease of native coronary artery without angina pectoris: Secondary | ICD-10-CM | POA: Insufficient documentation

## 2019-05-10 DIAGNOSIS — I10 Essential (primary) hypertension: Secondary | ICD-10-CM | POA: Diagnosis not present

## 2019-05-10 DIAGNOSIS — Z79899 Other long term (current) drug therapy: Secondary | ICD-10-CM | POA: Insufficient documentation

## 2019-05-10 DIAGNOSIS — N10 Acute tubulo-interstitial nephritis: Secondary | ICD-10-CM | POA: Insufficient documentation

## 2019-05-10 DIAGNOSIS — N179 Acute kidney failure, unspecified: Secondary | ICD-10-CM

## 2019-05-10 DIAGNOSIS — N12 Tubulo-interstitial nephritis, not specified as acute or chronic: Secondary | ICD-10-CM

## 2019-05-10 DIAGNOSIS — Z87891 Personal history of nicotine dependence: Secondary | ICD-10-CM | POA: Insufficient documentation

## 2019-05-10 DIAGNOSIS — Z20828 Contact with and (suspected) exposure to other viral communicable diseases: Secondary | ICD-10-CM | POA: Insufficient documentation

## 2019-05-10 DIAGNOSIS — R509 Fever, unspecified: Secondary | ICD-10-CM | POA: Diagnosis present

## 2019-05-10 DIAGNOSIS — E119 Type 2 diabetes mellitus without complications: Secondary | ICD-10-CM | POA: Insufficient documentation

## 2019-05-10 LAB — FERRITIN: Ferritin: 286 ng/mL (ref 24–336)

## 2019-05-10 LAB — CBC WITH DIFFERENTIAL/PLATELET
Abs Immature Granulocytes: 0.16 10*3/uL — ABNORMAL HIGH (ref 0.00–0.07)
Basophils Absolute: 0.1 10*3/uL (ref 0.0–0.1)
Basophils Relative: 0 %
Eosinophils Absolute: 0.1 10*3/uL (ref 0.0–0.5)
Eosinophils Relative: 1 %
HCT: 25.8 % — ABNORMAL LOW (ref 39.0–52.0)
Hemoglobin: 8.2 g/dL — ABNORMAL LOW (ref 13.0–17.0)
Immature Granulocytes: 1 %
Lymphocytes Relative: 5 %
Lymphs Abs: 0.9 10*3/uL (ref 0.7–4.0)
MCH: 29.1 pg (ref 26.0–34.0)
MCHC: 31.8 g/dL (ref 30.0–36.0)
MCV: 91.5 fL (ref 80.0–100.0)
Monocytes Absolute: 2.6 10*3/uL — ABNORMAL HIGH (ref 0.1–1.0)
Monocytes Relative: 15 %
Neutro Abs: 13.2 10*3/uL — ABNORMAL HIGH (ref 1.7–7.7)
Neutrophils Relative %: 78 %
Platelets: 297 10*3/uL (ref 150–400)
RBC: 2.82 MIL/uL — ABNORMAL LOW (ref 4.22–5.81)
RDW: 14 % (ref 11.5–15.5)
WBC: 17 10*3/uL — ABNORMAL HIGH (ref 4.0–10.5)
nRBC: 0 % (ref 0.0–0.2)

## 2019-05-10 LAB — URINALYSIS, ROUTINE W REFLEX MICROSCOPIC
Bilirubin Urine: NEGATIVE
Glucose, UA: NEGATIVE mg/dL
Hgb urine dipstick: NEGATIVE
Ketones, ur: NEGATIVE mg/dL
Nitrite: POSITIVE — AB
Protein, ur: 100 mg/dL — AB
Specific Gravity, Urine: 1.011 (ref 1.005–1.030)
WBC, UA: >50 WBC/hpf — ABNORMAL HIGH (ref 0–5)
pH: 6 (ref 5.0–8.0)

## 2019-05-10 LAB — COMPREHENSIVE METABOLIC PANEL
ALT: 11 U/L (ref 0–44)
AST: 20 U/L (ref 15–41)
Albumin: 3.1 g/dL — ABNORMAL LOW (ref 3.5–5.0)
Alkaline Phosphatase: 77 U/L (ref 38–126)
Anion gap: 11 (ref 5–15)
BUN: 23 mg/dL (ref 8–23)
CO2: 27 mmol/L (ref 22–32)
Calcium: 9 mg/dL (ref 8.9–10.3)
Chloride: 95 mmol/L — ABNORMAL LOW (ref 98–111)
Creatinine, Ser: 1.65 mg/dL — ABNORMAL HIGH (ref 0.61–1.24)
GFR calc Af Amer: 45 mL/min — ABNORMAL LOW (ref >60)
GFR calc non Af Amer: 39 mL/min — ABNORMAL LOW (ref >60)
Glucose, Bld: 156 mg/dL — ABNORMAL HIGH (ref 70–99)
Potassium: 4.7 mmol/L (ref 3.5–5.1)
Sodium: 133 mmol/L — ABNORMAL LOW (ref 135–145)
Total Bilirubin: 0.9 mg/dL (ref 0.3–1.2)
Total Protein: 7.3 g/dL (ref 6.5–8.1)

## 2019-05-10 LAB — TRIGLYCERIDES: Triglycerides: 76 mg/dL (ref <150)

## 2019-05-10 LAB — LACTATE DEHYDROGENASE: LDH: 241 U/L — ABNORMAL HIGH (ref 98–192)

## 2019-05-10 LAB — LACTIC ACID, PLASMA: Lactic Acid, Venous: 0.8 mmol/L (ref 0.5–1.9)

## 2019-05-10 LAB — PROCALCITONIN: Procalcitonin: 0.35 ng/mL

## 2019-05-10 LAB — D-DIMER, QUANTITATIVE: D-Dimer, Quant: 1.92 ug/mL-FEU — ABNORMAL HIGH (ref 0.00–0.50)

## 2019-05-10 LAB — FIBRINOGEN: Fibrinogen: >800 mg/dL — ABNORMAL HIGH (ref 210–475)

## 2019-05-10 LAB — SARS CORONAVIRUS 2 BY RT PCR (HOSPITAL ORDER, PERFORMED IN ~~LOC~~ HOSPITAL LAB): SARS Coronavirus 2: NEGATIVE

## 2019-05-10 LAB — C-REACTIVE PROTEIN: CRP: 25.4 mg/dL — ABNORMAL HIGH (ref <1.0)

## 2019-05-10 MED ORDER — CEPHALEXIN 500 MG PO CAPS: 500.0000 mg | ORAL_CAPSULE | Freq: Two times a day (BID) | ORAL | 0 refills | Status: AC

## 2019-05-10 MED ORDER — VANCOMYCIN HCL 10 G IV SOLR
2000.0000 mg | Freq: Once | INTRAVENOUS | Status: AC
  Administered 2019-05-10: 18:00:00 2000 mg via INTRAVENOUS
  Filled 2019-05-10: qty 2000

## 2019-05-10 MED ORDER — HYDROCORTISONE NA SUCCINATE PF 250 MG IJ SOLR
200.0000 mg | Freq: Once | INTRAMUSCULAR | Status: AC
  Administered 2019-05-10: 17:00:00 200 mg via INTRAVENOUS
  Filled 2019-05-10: qty 200

## 2019-05-10 MED ORDER — IOHEXOL 350 MG/ML SOLN
100.0000 mL | Freq: Once | INTRAVENOUS | Status: AC | PRN
  Administered 2019-05-10: 21:00:00 80 mL via INTRAVENOUS

## 2019-05-10 MED ORDER — SODIUM CHLORIDE 0.9 % IV BOLUS
1000.0000 mL | Freq: Once | INTRAVENOUS | Status: AC
  Administered 2019-05-10: 16:00:00 1000 mL via INTRAVENOUS

## 2019-05-10 MED ORDER — DIPHENHYDRAMINE HCL 50 MG/ML IJ SOLN
50.0000 mg | Freq: Once | INTRAMUSCULAR | Status: AC
  Administered 2019-05-10: 20:00:00 50 mg via INTRAVENOUS
  Filled 2019-05-10: qty 1

## 2019-05-10 MED ORDER — DIPHENHYDRAMINE HCL 25 MG PO CAPS
50.0000 mg | ORAL_CAPSULE | Freq: Once | ORAL | Status: AC
  Filled 2019-05-10 (×2): qty 2

## 2019-05-10 MED ORDER — PIPERACILLIN-TAZOBACTAM 3.375 G IVPB 30 MIN
3.3750 g | Freq: Once | INTRAVENOUS | Status: AC
  Administered 2019-05-10: 17:00:00 3.375 g via INTRAVENOUS
  Filled 2019-05-10: qty 50

## 2019-05-10 NOTE — ED Notes (Signed)
Pt has taken off lines from IV, leads for cardiac monitoring, pulse ox, BP cuff and is refusing to allow staff to put them back on. Dr. Billy Fischer aware.

## 2019-05-10 NOTE — Progress Notes (Signed)
A consult was received from an ED physician for Vancomycin & Zosyn per pharmacy dosing.  The patient's profile has been reviewed for ht/wt/allergies/indication/available labs. Last weight noted 04/22/2019 was 116kg   A one time order has been placed for Zosyn 3.375gm over 30 min, and Vancomycin 2gm x1.  Further antibiotics/pharmacy consults should be ordered by admitting physician if indicated.                       Thank you,  Minda Ditto PharmD 05/10/2019  3:54 PM

## 2019-05-10 NOTE — ED Notes (Addendum)
Pt screaming out for staff, upon entry pt screamed at RN to take out his IV's because he was leaving. Pt states he will walk out. Nonskid socks placed on pt (pt walks with walker at home).

## 2019-05-10 NOTE — ED Notes (Signed)
Pt continues to refuse cardiac monitoring and checking VS.

## 2019-05-10 NOTE — ED Notes (Signed)
Xray bedside.

## 2019-05-10 NOTE — ED Triage Notes (Signed)
Pt BIB EMS from [facility]. Pt reports cough, fever at 100.4, O2 at 88%. Pt 100% on 2L with EMS. Pt alert and oriented.   128/66 HR 102

## 2019-05-10 NOTE — ED Notes (Signed)
Pt states he is unable to void at this time, condom cath placed.

## 2019-05-10 NOTE — ED Provider Notes (Signed)
[facility] DEPT Provider Note   CSN: [id number] Arrival date & time: 05/10/19  1143     History   Chief Complaint Chief Complaint  Patient presents with   Fever   Cough    HPI Jorge Sherman is a 78 y.o. male.     The history is provided by the patient and medical records. No language interpreter was used.     78 year old male with history diabetes, obstructive sleep apnea, CAD, brought here via EMS from nursing facility for concerns of covid-19.  Patient has been at a rehab facility for the past 6 weeks for knee rehab.  For the past 3 to 4 days he has had fever, chills, cough, increased shortness of breath.  His PCP would like for him to be evaluated for his symptom.  Endorsed mild urinary discomfort on occasion amount of time.  He denies any significant chest pain, nausea vomiting or diarrhea.  Patient is hard of hearing, also difficult to understand.  Past Medical History:  Diagnosis Date   Chronic pain    Depressive disorder, not elsewhere classified    Dizziness and giddiness    Essential and other specified forms of tremor    Headache(784.0)    Leukocytosis, unspecified    Obstructive sleep apnea (adult) (pediatric)    Pain in joint, lower leg    Septic joint of left knee joint ([place]) 04/22/2019   Spinal stenosis, lumbar region, without neurogenic claudication    Thoracic or lumbosacral neuritis or radiculitis, unspecified    Type II or unspecified type diabetes mellitus without mention of complication, not stated as uncontrolled    Unspecified cardiovascular disease    Unspecified essential hypertension     Patient Active Problem List   Diagnosis Date Noted   Septic joint of left knee joint ([place]) 04/22/2019   Erectile dysfunction 10/30/2013   Essential hypertension, benign 10/30/2013   Coronary atherosclerosis of native coronary artery 10/30/2013   Dizziness and giddiness    Pain in joint, lower leg     Leukocytosis, unspecified    Type II or unspecified type diabetes mellitus without mention of complication, not stated as uncontrolled    Obstructive sleep apnea    Depressive disorder, not elsewhere classified    Essential and other specified forms of tremor    Headache(784.0)    Spinal stenosis, lumbar region, without neurogenic claudication    Thoracic or lumbosacral neuritis or radiculitis, unspecified    Chronic pain     Past Surgical History:  Procedure Laterality Date   BACK SURGERY     back surgey     times 3        Home Medications    Prior to Admission medications   Medication Sig Start Date End Date Taking? Authorizing Provider  acetaminophen (TYLENOL) 500 MG tablet Take 1,000 mg by mouth 3 (three) times daily.     [provider]  DULoxetine (CYMBALTA) 30 MG capsule Take 90 mg by mouth daily.    [provider]  fentaNYL (DURAGESIC - DOSED MCG/HR) 25 MCG/HR patch Place 25 mcg onto the skin every 3 (three) days.    [provider]  gabapentin (NEURONTIN) 800 MG tablet Take 800 mg by mouth 4 (four) times daily.    [provider]  HYDROmorphone (DILAUDID) 4 MG tablet Take 4 mg by mouth every 4 (four) hours as needed for severe pain.    [provider]  hydroxychloroquine (PLAQUENIL) 200 MG tablet Take 200 mg  by mouth 2 (two) times daily.    [provider]  Iron-FA-B Cmp-C-Biot-Probiotic (FUSION PLUS) CAPS Take 1 tablet by mouth daily. 02/26/18   [provider]  lactose free nutrition (BOOST) LIQD Take 237 mLs by mouth 3 (three) times daily with meals.    [provider]  linaclotide (LINZESS) 145 MCG CAPS capsule Take 145 mcg by mouth daily.  11/26/17   [provider]  LORazepam (ATIVAN) 2 MG tablet Take 2 mg by mouth every 8 (eight) hours as needed for anxiety.    [provider]  olmesartan (BENICAR) 40 MG tablet Take 20 mg by mouth daily.     [provider]    omeprazole (PRILOSEC) 40 MG capsule Take 40 mg by mouth daily.    [provider]  predniSONE (STERAPRED UNI-PAK 21 TAB) 10 MG (21) TBPK tablet Take 6 tabs by mouth daily  for 2 days, then 5 tabs for 2 days, then 4 tabs for 2 days, then 3 tabs for 2 days, 2 tabs for 2 days, then 1 tab by mouth daily for 2 days 08/24/18   Isla Pence, MD  rosuvastatin (CRESTOR) 20 MG tablet Take 20 mg by mouth every evening.    [provider]  tamsulosin (FLOMAX) 0.4 MG CAPS capsule Take 0.4 mg by mouth daily.    [provider]    Family History Family History  Problem Relation Age of Onset   Cancer Father    Heart disease Father    Stroke Mother    Diabetes Mother     Social History Social History   Tobacco Use   Smoking status: Former Smoker   Smokeless tobacco: Never Used  Substance Use Topics   Alcohol use: No   Drug use: No     Allergies   Iohexol, Fentanyl, and Iodine   Review of Systems Review of Systems  All other systems reviewed and are negative.    Physical Exam Updated Vital Signs BP 107/72    Pulse (!) 102    Temp 99.2 F (37.3 C) (Oral)    Resp 17    SpO2 92%   Physical Exam Vitals signs and nursing note reviewed.  Constitutional:      General: He is not in acute distress.    Appearance: He is well-developed. He is obese.  HENT:     Head: Atraumatic.  Eyes:     Conjunctiva/sclera: Conjunctivae normal.  Neck:     Musculoskeletal: Neck supple.  Cardiovascular:     Rate and Rhythm: Tachycardia present.  Pulmonary:     Effort: Pulmonary effort is normal.     Breath sounds: Normal breath sounds.  Abdominal:     Palpations: Abdomen is soft.     Tenderness: There is no abdominal tenderness.  Musculoskeletal:        General: Tenderness present. No swelling.  Skin:    Findings: No rash.  Neurological:     Mental Status: He is alert.  Psychiatric:        Mood and Affect: Mood normal.      ED Treatments / Results   Labs (all labs ordered are listed, but only abnormal results are displayed) Labs Reviewed  CBC WITH DIFFERENTIAL/PLATELET - Abnormal; Notable for the following components:      Result Value   WBC 17.0 (*)    RBC 2.82 (*)    Hemoglobin 8.2 (*)    HCT 25.8 (*)    Neutro Abs 13.2 (*)  Monocytes Absolute 2.6 (*)    Abs Immature Granulocytes 0.16 (*)    All other components within normal limits  COMPREHENSIVE METABOLIC PANEL - Abnormal; Notable for the following components:   Sodium 133 (*)    Chloride 95 (*)    Glucose, Bld 156 (*)    Creatinine, Ser 1.65 (*)    Albumin 3.1 (*)    GFR calc non Af Amer 39 (*)    GFR calc Af Amer 45 (*)    All other components within normal limits  D-DIMER, QUANTITATIVE (NOT AT [facility]) - Abnormal; Notable for the following components:   D-Dimer, Quant 1.92 (*)    All other components within normal limits  LACTATE DEHYDROGENASE - Abnormal; Notable for the following components:   LDH 241 (*)    All other components within normal limits  FIBRINOGEN - Abnormal; Notable for the following components:   Fibrinogen >800 (*)    All other components within normal limits  C-REACTIVE PROTEIN - Abnormal; Notable for the following components:   CRP 25.4 (*)    All other components within normal limits  SARS CORONAVIRUS 2 (HOSPITAL ORDER, [facility])  CULTURE, BLOOD (ROUTINE X 2)  CULTURE, BLOOD (ROUTINE X 2)  LACTIC ACID, PLASMA  PROCALCITONIN  FERRITIN  TRIGLYCERIDES  LACTIC ACID, PLASMA  URINALYSIS, ROUTINE W REFLEX MICROSCOPIC    EKG EKG Interpretation  Date/Time:  Friday May 10 2019 12:05:06 EDT Ventricular Rate:  101 PR Interval:    QRS Duration: 104 QT Interval:  334 QTC Calculation: 433 R Axis:   -20 Text Interpretation:  Poor data quality Normal sinus rhythm Borderline left axis deviation Low voltage, precordial leads Baseline wander Artifact When compared with ECG of 10/12/2015 Artifact is now Present suggest  repeat tracing Confirmed by Francine Graven [phone]) on 05/10/2019 12:52:31 PM   Radiology Dg Chest Port 1 View  Result Date: 05/10/2019 CLINICAL DATA:  Cough, fever and hypoxia. EXAM: PORTABLE CHEST 1 VIEW COMPARISON:  08/16/2018. FINDINGS: Borderline enlarged cardiac silhouette. Clear lungs with normal vascularity. Left shoulder degenerative changes. IMPRESSION: No acute abnormality. Electronically Signed   By: Claudie Revering M.D.   On: 05/10/2019 13:28    Procedures Procedures (including critical care time)  Medications Ordered in ED Medications  sodium chloride 0.9 % bolus 1,000 mL (has no administration in time range)  hydrocortisone sodium succinate (SOLU-CORTEF) injection 200 mg (has no administration in time range)  diphenhydrAMINE (BENADRYL) capsule 50 mg (has no administration in time range)    Or  diphenhydrAMINE (BENADRYL) injection 50 mg (has no administration in time range)     Initial Impression / Assessment and Plan / ED Course  I have reviewed the triage vital signs and the nursing notes.  Pertinent labs & imaging results that were available during my care of the patient were reviewed by me and considered in my medical decision making (see chart for details).        BP (!) 172/142    Pulse 92    Temp 99.2 F (37.3 C) (Rectal)    Resp (!) 21    SpO2 94%    Final Clinical Impressions(s) / ED Diagnoses   Final diagnoses:  Suspected Covid-19 Virus Infection  AKI (acute kidney injury) [facility])  Pyelonephritis    ED Discharge Orders    None     1:09 PM Patient sent here from a rehab facility for symptoms concerning for COVID.  He has had cough, shortness of breath and fever.  Initial temperature is 99.2 mildly tachycardic with heart rate of 101.  Elevated WBC of 17, hemoglobin is 8.2 however similar to value 8 months ago.  His inflammatory marker elevated which include an elevated d-dimer of 1.92, elevated LDH of 241, elevated fibrinogen of greater than 800.  Lactic acid is normal.   3:03 PM COVID-19 testing today is negative.  Chest x-ray is unremarkable.  He does complain of some urinary discomfort however urine has not resulted yet.  Evidence of mild AKI with creatinine of 1.65, will give IV fluid here.  Although patient may benefit from chest CT angiogram to rule out PE given elevated d-dimer, due to his renal function impairment CTA has not been performed. Will ambulate pt and check O2. Pt sign out to oncoming provider who will f/u on UA result and help determine disposition.    3:30 PM I discussed care with Dr. Billy Fischer, plan to premedicate pt prior to performing chest CTA with benadryl and solu-cortef as pt is allergic to IV contrast dye. This test will take 4 hrs for premedicating pt before the test can be perform.  Pt has bilateral knee pain. On exam although pt has pain with knee ROM bilaterally, but no overlying skin changes or swelling to suggest septic joint or cellulitis. ID specialist Dr. Tommy Medal have evaluated pt on 06/01 for his knees and did recommend ortho to aspirate L knee to r/o septic joint   3:44 PM UA shows evidence of UTI, pt does report dysuria.  Pt will need to be treated for UTI.  Pt sign out to Dr. Billy Fischer.  FRANKEY BOTTING was evaluated in Emergency Department on 05/10/2019 for the symptoms described in the history of present illness. He was evaluated in the context of the global COVID-19 pandemic, which necessitated consideration that the patient might be at risk for infection with the SARS-CoV-2 virus that causes COVID-19. Institutional protocols and algorithms that pertain to the evaluation of patients at risk for COVID-19 are in a state of rapid change based on information released by regulatory bodies including the CDC and federal and state organizations. These policies and algorithms were followed during the patient's care in the ED.     Domenic Moras, PA-C 05/10/19 [place], [place], DO 05/13/19 1455

## 2019-05-10 NOTE — ED Notes (Signed)
Dr. Billy Fischer bedside

## 2019-05-10 NOTE — ED Notes (Addendum)
Pt stood at bedside from chair with x2 assist from staff to pivot back into bed. Pt belligerent to staff while trying to help and screaming for someone to "help him back to his home".  Pt back in his bed at this time with bed in the lowest position.

## 2019-05-10 NOTE — Progress Notes (Signed)
[facility] ED TOC CM - referral SNF  Received call from ED RN, pt is from [facility] and facility states they will hold his bed for 24 hours. Family can opt to pay additional fee to hold bed if pt is admitted. TOC CM/CSW will continue to follow for dc needs. Jonnie Finner RN CCM Case Mgmt phone [phone]

## 2019-05-10 NOTE — ED Notes (Signed)
[facility] called  

## 2019-05-10 NOTE — ED Notes (Signed)
Bed: WA20 Expected date:  Expected time:  Means of arrival:  Comments: EMS-fever/weak

## 2019-05-10 NOTE — ED Notes (Signed)
Pt refusing to stand out of bed. Pt states "no I do not feel like it". Pt raising his voice when RN attempted to explain that provider wanted pt to ambulate and watch O2. Will continue to monitor.

## 2019-05-10 NOTE — ED Notes (Addendum)
Patient unable to stand, while nurse and tech attempted to get orthostatic vitals, pulse ox while ambulating. Pt states "I cant stand".

## 2019-05-10 NOTE — ED Provider Notes (Signed)
  Physical Exam  BP 119/80   Pulse 92   Temp 99.2 F (37.3 C) (Rectal)   Resp 18   SpO2 94%   Physical Exam  ED Course/Procedures     Procedures  MDM    Received care of forearm Jorge Sherman and Dr. Thurnell Garbe.  Please see their note for prior history, physical exam.  Briefly is a 78 year old male with a history of diabetes OSA, CAD, with recent encephalopathy due to both urinary tract infection and cellulitis who is currently residing in a rehab facility.  Unclear if he was diagnosed with septic arthritis specifically or if he had PICC placed for treatment of cellulitis.  Plan 6/1 per ID note was to dc abx and have pt have arthrocentesis with Orthopedics after being off abx for 2 weeks.  Pt at this time has no sign of septic arthritis or cellulitis.  He presented with fever, chills, cough and urinary symptoms.  Initially unclear source of infection and vanc/zosyn ordered. No pneumonia, COVID19 testing negative.  UA returned showing UTI.   Premedicated for PE scan given hx of cough, dyspnea. CT PE study without PE. On my evaluation, he is with  Normal saturation on room air, is well appearing and anxious to leave.  He does have confusion. Discussed with his daughter, [name], who states he does have waxing/waning confusion that is brought on by infections.  Discussed we could admit him however given stable vital signs and no significant encephalopathy I feel he is appropriate for oral abx and outpatient follow up.  Given rx for keflex. Patient discharged in stable condition with understanding of reasons to return.      [name], MD 05/11/19 1218

## 2019-05-10 NOTE — ED Notes (Signed)
Pt assisted with urinal in bed. Pt refused vital signs with this writer because "its the middle of the night".

## 2019-05-10 NOTE — ED Notes (Signed)
Patient's daughter [name] [phone] the facility will give up his bed within 24 hours if he doesn't return

## 2019-05-10 NOTE — ED Notes (Signed)
Pt gets out of bed and stands by door after taking his brief off and is demanding staff "to fix my diaper, I am going to fall". Pt assisted to chair and then back to bed.

## 2019-05-13 LAB — URINE CULTURE: Culture: >=100000 — AB

## 2019-05-14 ENCOUNTER — Telehealth: Payer: Self-pay

## 2019-05-14 NOTE — Telephone Encounter (Signed)
Post ED Visit - Positive Culture Follow-up  Culture report reviewed by antimicrobial stewardship pharmacist: [place] Team []  Elenor Quinones, Pharm.D. []  Heide Guile, Pharm.D., BCPS AQ-ID []  Parks Neptune, Pharm.D., BCPS []  Alycia Rossetti, Pharm.D., BCPS []  [place], Pharm.D., BCPS, AAHIVP []  Legrand Como, Pharm.D., BCPS, AAHIVP []  Salome Arnt, PharmD, BCPS []  Johnnette Gourd, PharmD, BCPS []  Hughes Better, PharmD, BCPS []  Leeroy Cha, PharmD []  Laqueta Linden, PharmD, BCPS []  Albertina Parr, PharmD  [place] Team []  Leodis Sias, PharmD []  Lindell Spar, PharmD []  Royetta Asal, PharmD []  Graylin Shiver, Rph []  Rema Fendt) Glennon Mac, PharmD []  Arlyn Dunning, PharmD []  Netta Cedars, PharmD [x]  Dia Sitter, PharmD []  Leone [place], PharmD []  Gretta Arab, PharmD []  Theodis Shove, PharmD []  Peggyann Juba, PharmD []  Reuel Boom, PharmD   Positive urine culture Treated with Cephalelxin, organism sensitive to the same and no further patient follow-up is required at this time.  Genia Del 05/14/2019, 9:20 AM

## 2019-05-15 LAB — CULTURE, BLOOD (ROUTINE X 2)
Culture: NO GROWTH
Culture: NO GROWTH
Special Requests: ADEQUATE
Special Requests: ADEQUATE

## 2019-07-01 ENCOUNTER — Emergency Department (HOSPITAL_COMMUNITY): Payer: Medicare Other

## 2019-07-01 ENCOUNTER — Other Ambulatory Visit: Payer: Self-pay

## 2019-07-01 ENCOUNTER — Emergency Department (HOSPITAL_COMMUNITY)
Admission: EM | Admit: 2019-07-01 | Discharge: 2019-07-02 | Disposition: A | Discharge status: Home / Self Care | Payer: Medicare Other | Attending: Emergency Medicine | Admitting: Emergency Medicine

## 2019-07-01 ENCOUNTER — Encounter (HOSPITAL_COMMUNITY): Payer: Self-pay | Admitting: Emergency Medicine

## 2019-07-01 DIAGNOSIS — Z79899 Other long term (current) drug therapy: Secondary | ICD-10-CM | POA: Insufficient documentation

## 2019-07-01 DIAGNOSIS — N3000 Acute cystitis without hematuria: Secondary | ICD-10-CM | POA: Insufficient documentation

## 2019-07-01 DIAGNOSIS — I2510 Atherosclerotic heart disease of native coronary artery without angina pectoris: Secondary | ICD-10-CM | POA: Diagnosis not present

## 2019-07-01 DIAGNOSIS — E119 Type 2 diabetes mellitus without complications: Secondary | ICD-10-CM | POA: Insufficient documentation

## 2019-07-01 DIAGNOSIS — Z87891 Personal history of nicotine dependence: Secondary | ICD-10-CM | POA: Insufficient documentation

## 2019-07-01 DIAGNOSIS — M25572 Pain in left ankle and joints of left foot: Secondary | ICD-10-CM | POA: Diagnosis present

## 2019-07-01 DIAGNOSIS — I10 Essential (primary) hypertension: Secondary | ICD-10-CM | POA: Diagnosis not present

## 2019-07-01 LAB — URINALYSIS, ROUTINE W REFLEX MICROSCOPIC
Bilirubin Urine: NEGATIVE
Glucose, UA: NEGATIVE mg/dL
Ketones, ur: NEGATIVE mg/dL
Nitrite: POSITIVE — AB
Protein, ur: 30 mg/dL — AB
Specific Gravity, Urine: 1.009 (ref 1.005–1.030)
WBC, UA: >50 WBC/hpf — ABNORMAL HIGH (ref 0–5)
pH: 6 (ref 5.0–8.0)

## 2019-07-01 LAB — CBC WITH DIFFERENTIAL/PLATELET
Abs Immature Granulocytes: 0.28 10*3/uL — ABNORMAL HIGH (ref 0.00–0.07)
Basophils Absolute: 0.1 10*3/uL (ref 0.0–0.1)
Basophils Relative: 0 %
Eosinophils Absolute: 0 10*3/uL (ref 0.0–0.5)
Eosinophils Relative: 0 %
HCT: 29.8 % — ABNORMAL LOW (ref 39.0–52.0)
Hemoglobin: 8.9 g/dL — ABNORMAL LOW (ref 13.0–17.0)
Immature Granulocytes: 2 %
Lymphocytes Relative: 9 %
Lymphs Abs: 1.4 10*3/uL (ref 0.7–4.0)
MCH: 27.8 pg (ref 26.0–34.0)
MCHC: 29.9 g/dL — ABNORMAL LOW (ref 30.0–36.0)
MCV: 93.1 fL (ref 80.0–100.0)
Monocytes Absolute: 2.7 10*3/uL — ABNORMAL HIGH (ref 0.1–1.0)
Monocytes Relative: 17 %
Neutro Abs: 11.7 10*3/uL — ABNORMAL HIGH (ref 1.7–7.7)
Neutrophils Relative %: 72 %
Platelets: 312 10*3/uL (ref 150–400)
RBC: 3.2 MIL/uL — ABNORMAL LOW (ref 4.22–5.81)
RDW: 14.8 % (ref 11.5–15.5)
WBC: 16.2 10*3/uL — ABNORMAL HIGH (ref 4.0–10.5)
nRBC: 0 % (ref 0.0–0.2)

## 2019-07-01 LAB — BASIC METABOLIC PANEL
Anion gap: 11 (ref 5–15)
BUN: 21 mg/dL (ref 8–23)
CO2: 28 mmol/L (ref 22–32)
Calcium: 9 mg/dL (ref 8.9–10.3)
Chloride: 99 mmol/L (ref 98–111)
Creatinine, Ser: 1.34 mg/dL — ABNORMAL HIGH (ref 0.61–1.24)
GFR calc Af Amer: 58 mL/min — ABNORMAL LOW (ref >60)
GFR calc non Af Amer: 50 mL/min — ABNORMAL LOW (ref >60)
Glucose, Bld: 142 mg/dL — ABNORMAL HIGH (ref 70–99)
Potassium: 4.2 mmol/L (ref 3.5–5.1)
Sodium: 138 mmol/L (ref 135–145)

## 2019-07-01 MED ORDER — CEPHALEXIN 500 MG PO CAPS
500.0000 mg | ORAL_CAPSULE | Freq: Once | ORAL | Status: AC
  Administered 2019-07-01: 500 mg via ORAL
  Filled 2019-07-01: qty 1

## 2019-07-01 MED ORDER — CEPHALEXIN 500 MG PO CAPS: 500.0000 mg | ORAL_CAPSULE | Freq: Four times a day (QID) | ORAL | 0 refills | Status: DC

## 2019-07-01 NOTE — ED Provider Notes (Signed)
[facility] DEPT Provider Note   CSN: [id number] Arrival date & time: 07/01/19  1909     History   Chief Complaint Chief Complaint  Patient presents with  . Fall    HPI Jorge Sherman is a 78 y.o. male.  Patient reports that he fell while trying to get out of his bed and landed on his left ankle.  Patient denies hitting his back, neck, head.  Time of my interview patient states he is having severe pain over the lateral side of his left ankle.  He denies any new back pain, neck pain, chest or abdominal pain.  Denies loss of consciousness.  States he has chronic shoulder pain and denies injuring his shoulders today.  On review of systems does endorse having some dysuria.     HPI  Past Medical History:  Diagnosis Date  . Chronic pain   . Depressive disorder, not elsewhere classified   . Dizziness and giddiness   . Essential and other specified forms of tremor   . Headache(784.0)   . Leukocytosis, unspecified   . Obstructive sleep apnea (adult) (pediatric)   . Pain in joint, lower leg   . Septic joint of left knee joint ([place]) 04/22/2019  . Spinal stenosis, lumbar region, without neurogenic claudication   . Thoracic or lumbosacral neuritis or radiculitis, unspecified   . Type II or unspecified type diabetes mellitus without mention of complication, not stated as uncontrolled   . Unspecified cardiovascular disease   . Unspecified essential hypertension     Patient Active Problem List   Diagnosis Date Noted  . Septic joint of left knee joint ([place]) 04/22/2019  . Erectile dysfunction 10/30/2013  . Essential hypertension, benign 10/30/2013  . Coronary atherosclerosis of native coronary artery 10/30/2013  . Dizziness and giddiness   . Pain in joint, lower leg   . Leukocytosis, unspecified   . Type II or unspecified type diabetes mellitus without mention of complication, not stated as uncontrolled   . Obstructive sleep apnea   . Depressive disorder,  not elsewhere classified   . Essential and other specified forms of tremor   . Headache(784.0)   . Spinal stenosis, lumbar region, without neurogenic claudication   . Thoracic or lumbosacral neuritis or radiculitis, unspecified   . Chronic pain     Past Surgical History:  Procedure Laterality Date  . BACK SURGERY    . back surgey     times 3        Home Medications    Prior to Admission medications   Medication Sig Start Date End Date Taking? Authorizing Provider  amLODipine (NORVASC) 2.5 MG tablet Take 2.5 mg by mouth daily.    [provider]  bisacodyl (DULCOLAX) 10 MG suppository Place 10 mg rectally daily as needed for moderate constipation.    [provider]  celecoxib (CELEBREX) 200 MG capsule Take 200 mg by mouth 2 (two) times daily.    [provider]  cloNIDine (CATAPRES) 0.1 MG tablet Take 0.1 mg by mouth 2 (two) times daily.    [provider]  clotrimazole-betamethasone (LOTRISONE) cream Apply 1 application topically 2 (two) times daily.    [provider]  docusate sodium (COLACE) 100 MG capsule Take 100 mg by mouth daily.    [provider]  DULoxetine (CYMBALTA) 30 MG capsule Take 30 mg by mouth 3 (three) times daily.     [provider]  ferrous sulfate 325 (65 FE) MG tablet Take 325  mg by mouth daily with breakfast.    [provider]  furosemide (LASIX) 20 MG tablet Take 20 mg by mouth 2 (two) times daily.    [provider]  gabapentin (NEURONTIN) 800 MG tablet Take 800 mg by mouth 3 (three) times daily.     [provider]  hydroxychloroquine (PLAQUENIL) 200 MG tablet Take 200 mg by mouth 2 (two) times daily.    [provider]  LORazepam (ATIVAN) 1 MG tablet Take 1 mg by mouth every 8 (eight) hours as needed for anxiety.     [provider]  magnesium hydroxide (MILK OF MAGNESIA) 400 MG/5ML suspension Take 30 mLs by mouth daily as needed for mild  constipation.    [provider]  methocarbamol (ROBAXIN) 500 MG tablet Take 500 mg by mouth 3 (three) times daily as needed for muscle spasms.    [provider]  olmesartan (BENICAR) 20 MG tablet Take 20 mg by mouth daily.     [provider]  omeprazole (PRILOSEC) 40 MG capsule Take 40 mg by mouth daily.    [provider]  ondansetron (ZOFRAN) 4 MG tablet Take 4 mg by mouth every 8 (eight) hours as needed for nausea or vomiting.    [provider]  oxyCODONE-acetaminophen (PERCOCET) 10-325 MG tablet Take 1 tablet by mouth every 4 (four) hours as needed for pain.    [provider]  polyethylene glycol (MIRALAX / GLYCOLAX) 17 g packet Take 17 g by mouth daily as needed for mild constipation.    [provider]  rosuvastatin (CRESTOR) 40 MG tablet Take 40 mg by mouth every evening.     [provider]  tamsulosin (FLOMAX) 0.4 MG CAPS capsule Take 0.4 mg by mouth daily.    [provider]    Family History Family History  Problem Relation Age of Onset  . Cancer Father   . Heart disease Father   . Stroke Mother   . Diabetes Mother     Social History Social History   Tobacco Use  . Smoking status: Former Research scientist (life sciences)  . Smokeless tobacco: Never Used  Substance Use Topics  . Alcohol use: No  . Drug use: No     Allergies   Iohexol, Fentanyl, and Iodine   Review of Systems Review of Systems  Constitutional: Negative for chills and fever.  HENT: Negative for ear pain and sore throat.   Eyes: Negative for pain and visual disturbance.  Respiratory: Negative for cough and shortness of breath.   Cardiovascular: Negative for chest pain and palpitations.  Gastrointestinal: Negative for abdominal pain and vomiting.  Genitourinary: Positive for dysuria. Negative for hematuria.  Musculoskeletal: Positive for arthralgias. Negative for back pain.  Skin: Negative for color change and rash.  Neurological: Negative  for seizures and syncope.  All other systems reviewed and are negative.    Physical Exam Updated Vital Signs BP (!) 169/91   Pulse (!) 103   Temp 99.1 F (37.3 C) (Oral)   Resp 18   SpO2 92%    Physical Exam Constitutional:      Comments: Morbidly obese, lying in bed in no acute distress  HENT:     Head: Normocephalic and atraumatic.     Nose: Nose normal.     Mouth/Throat:     Mouth: Mucous membranes are moist.     Pharynx: Oropharynx is clear.  Eyes:     Extraocular Movements: Extraocular movements intact.     Pupils: Pupils  are equal, round, and reactive to light.  Neck:     Musculoskeletal: Normal range of motion.     Comments: No tenderness to palpation Cardiovascular:     Rate and Rhythm: Normal rate and regular rhythm.     Pulses: Normal pulses.     Heart sounds: Normal heart sounds.  Pulmonary:     Effort: Pulmonary effort is normal. No respiratory distress.     Breath sounds: Normal breath sounds.  Abdominal:     General: Abdomen is flat. Bowel sounds are normal. There is no distension.     Palpations: Abdomen is soft.     Tenderness: There is no abdominal tenderness. There is no guarding.  Musculoskeletal:     Comments: LLE: Tenderness palpation over left lateral ankle, no tenderness to palpation throughout the rest of extremity, distal sensation and pulses intact RLE: No tenderness palpation throughout extremity, distal sensation and pulses intact\ RUE: No tenderness palpation throughout extremity, distal sensation and pulses intact\ LUE: No tenderness palpation throughout extremity, distal sensation and pulses intact\ Back: No C T L spine TTP      ED Treatments / Results  Labs (all labs ordered are listed, but only abnormal results are displayed) Labs Reviewed  URINALYSIS, ROUTINE W REFLEX MICROSCOPIC - Abnormal; Notable for the following components:      Result Value   APPearance TURBID (*)    Hgb urine dipstick SMALL (*)    Protein, ur 30 (*)     Nitrite POSITIVE (*)    Leukocytes,Ua LARGE (*)    WBC, UA >50 (*)    Bacteria, UA FEW (*)    All other components within normal limits  CBC WITH DIFFERENTIAL/PLATELET - Abnormal; Notable for the following components:   WBC 16.2 (*)    RBC 3.20 (*)    Hemoglobin 8.9 (*)    HCT 29.8 (*)    MCHC 29.9 (*)    Neutro Abs 11.7 (*)    Monocytes Absolute 2.7 (*)    Abs Immature Granulocytes 0.28 (*)    All other components within normal limits  BASIC METABOLIC PANEL - Abnormal; Notable for the following components:   Glucose, Bld 142 (*)    Creatinine, Ser 1.34 (*)    GFR calc non Af Amer 50 (*)    GFR calc Af Amer 58 (*)    All other components within normal limits  URINE CULTURE    EKG None  Radiology Dg Ankle Complete Left  Result Date: 07/01/2019 CLINICAL DATA:  Fall, unable to stand on ankle. EXAM: LEFT ANKLE COMPLETE - 3+ VIEW COMPARISON:  None. FINDINGS: The lateral projection is suboptimal with rotation. No definite acute fracture or traumatic malalignment. No medial or lateral clear space widening. No soft tissue swelling vascular calcium and benign soft tissue calcifications are present. Enthesopathic changes are present at the calcaneal insertions of the Achilles and plantar ligament. Degenerative changes are present in the ankle and hindfoot. Midfoot and hindfoot alignment is grossly preserved though incompletely assessed on nonweightbearing films. IMPRESSION: No definite acute osseous abnormality. Electronically Signed   By: Lovena Le M.D.   On: 07/01/2019 20:16   Dg Chest Portable 1 View  Result Date: 07/01/2019 CLINICAL DATA:  Fall from standing. EXAM: PORTABLE CHEST 1 VIEW COMPARISON:  May 10, 2019 FINDINGS: The heart size is stable from prior study. There is no pneumothorax. No large pleural effusion. No focal infiltrate. There are advanced degenerative changes of both shoulders, left worse than right. There are probable  multiple intra-articular loose bodies in the  left glenohumeral joint space. IMPRESSION: No active disease. Electronically Signed   By: Constance Holster M.D.   On: 07/01/2019 22:11    Procedures Procedures (including critical care time)  Medications Ordered in ED Medications - No data to display   Initial Impression / Assessment and Plan / ED Course  I have reviewed the triage vital signs and the nursing notes.  Pertinent labs & imaging results that were available during my care of the patient were reviewed by me and considered in my medical decision making (see chart for details).        78 year old male presented to the emergency department after falling out of bed and complaining of left ankle pain.  X-ray negative for acute fracture.  Based on my examination, no other findings concerning for acute trauma.  Labs concerning for leukocytosis.  Based on chart review appears to be chronic.  Urine concerning for urinary tract infection.  Reviewed culture results from last UTI.  Will start on cephalexin.  Gave initial dose here.  Reviewed return precautions, will discharge home.     After the discussed management above, the patient was determined to be safe for discharge.  The patient was in agreement with this plan and all questions regarding their care were answered.  ED return precautions were discussed and the patient will return to the ED with any significant worsening of condition.    Final Clinical Impressions(s) / ED Diagnoses   Final diagnoses:  Acute cystitis without hematuria    ED Discharge Orders    None       Lucrezia Starch, MD 07/02/19 [phone]

## 2019-07-01 NOTE — ED Triage Notes (Signed)
Pt arriving via [facility] for fall. Pt reports he fell out of bed. Complaining of left ankle pain and lower back pain. Recently discharged from rehab facility.

## 2019-07-01 NOTE — Discharge Instructions (Signed)
Recommend weightbearing as tolerated on your left ankle.  Recommend Tylenol for pain control.  Please take the antibiotic as prescribed.  Recommend calling your primary doctor for recheck later this week.  Please return to the emergency department if you develop abdominal pain, chest pain, difficulty breathing, fever or other new concerning symptoms.

## 2019-07-01 NOTE — ED Notes (Signed)
[facility] contacted for discharge transport 

## 2019-07-02 DIAGNOSIS — N3000 Acute cystitis without hematuria: Secondary | ICD-10-CM | POA: Diagnosis not present

## 2019-07-02 MED ORDER — CEPHALEXIN 500 MG PO CAPS: 500.0000 mg | ORAL_CAPSULE | Freq: Four times a day (QID) | ORAL | 0 refills | Status: DC

## 2019-07-02 MED ORDER — CEPHALEXIN 500 MG PO CAPS: 500.0000 mg | ORAL_CAPSULE | Freq: Four times a day (QID) | ORAL | 0 refills | Status: AC

## 2019-07-02 MED ORDER — OXYCODONE-ACETAMINOPHEN 5-325 MG PO TABS
1.0000 | ORAL_TABLET | Freq: Once | ORAL | Status: AC
  Administered 2019-07-02: 1 via ORAL
  Filled 2019-07-02: qty 1

## 2019-07-03 LAB — URINE CULTURE: Culture: >=100000 — AB

## 2019-07-04 ENCOUNTER — Telehealth: Payer: Self-pay

## 2019-07-04 NOTE — Telephone Encounter (Signed)
Post ED Visit - Positive Culture Follow-up  Culture report reviewed by antimicrobial stewardship pharmacist: [facility] Team []  [address] Pharm.D. []  Heide Guile, Pharm.D., BCPS AQ-ID []  Parks Neptune, Pharm.D., BCPS []  Alycia Rossetti, Pharm.D., BCPS []  [place], Pharm.D., BCPS, AAHIVP []  Legrand Como, Pharm.D., BCPS, AAHIVP []  Salome Arnt, PharmD, BCPS []  Johnnette Gourd, PharmD, BCPS []  Hughes Better, PharmD, BCPS []  Leeroy Cha, PharmD []  Laqueta Linden, PharmD, BCPS []  Albertina Parr, PharmD  [facility] Team []  Leodis Sias, PharmD []  Lindell Spar, PharmD []  Royetta Asal, PharmD []  Graylin Shiver, Rph []  Rema Fendt) Glennon Mac, PharmD []  Arlyn Dunning, PharmD []  Netta Cedars, PharmD []  Dia Sitter, PharmD []  Leone Haven, PharmD []  Gretta Arab, PharmD []  Theodis Shove, PharmD []  Peggyann Juba, PharmD [x]  Reuel Boom, PharmD   Positive urine culture Treated with Cephalexin, organism sensitive to the same and no further patient follow-up is required at this time.  Genia Del 07/04/2019, 11:49 AM

## 2019-08-05 ENCOUNTER — Encounter (HOSPITAL_COMMUNITY): Payer: Self-pay

## 2019-08-05 ENCOUNTER — Emergency Department (HOSPITAL_COMMUNITY): Payer: Medicare Other

## 2019-08-05 ENCOUNTER — Emergency Department (HOSPITAL_COMMUNITY)
Admission: EM | Admit: 2019-08-05 | Discharge: 2019-08-05 | Disposition: A | Discharge status: Home / Self Care | Payer: Medicare Other | Attending: Emergency Medicine | Admitting: Emergency Medicine

## 2019-08-05 DIAGNOSIS — Z87891 Personal history of nicotine dependence: Secondary | ICD-10-CM | POA: Insufficient documentation

## 2019-08-05 DIAGNOSIS — N390 Urinary tract infection, site not specified: Secondary | ICD-10-CM

## 2019-08-05 DIAGNOSIS — W06XXXA Fall from bed, initial encounter: Secondary | ICD-10-CM | POA: Diagnosis not present

## 2019-08-05 DIAGNOSIS — Z79899 Other long term (current) drug therapy: Secondary | ICD-10-CM | POA: Diagnosis not present

## 2019-08-05 DIAGNOSIS — E119 Type 2 diabetes mellitus without complications: Secondary | ICD-10-CM | POA: Diagnosis not present

## 2019-08-05 DIAGNOSIS — Y92003 Bedroom of unspecified non-institutional (private) residence as the place of occurrence of the external cause: Secondary | ICD-10-CM | POA: Insufficient documentation

## 2019-08-05 DIAGNOSIS — Y999 Unspecified external cause status: Secondary | ICD-10-CM | POA: Diagnosis not present

## 2019-08-05 DIAGNOSIS — I10 Essential (primary) hypertension: Secondary | ICD-10-CM | POA: Diagnosis not present

## 2019-08-05 DIAGNOSIS — S3992XA Unspecified injury of lower back, initial encounter: Secondary | ICD-10-CM | POA: Diagnosis present

## 2019-08-05 DIAGNOSIS — S39012A Strain of muscle, fascia and tendon of lower back, initial encounter: Secondary | ICD-10-CM

## 2019-08-05 DIAGNOSIS — Y9389 Activity, other specified: Secondary | ICD-10-CM | POA: Insufficient documentation

## 2019-08-05 DIAGNOSIS — W19XXXA Unspecified fall, initial encounter: Secondary | ICD-10-CM

## 2019-08-05 LAB — URINALYSIS, ROUTINE W REFLEX MICROSCOPIC
Bilirubin Urine: NEGATIVE
Glucose, UA: NEGATIVE mg/dL
Hgb urine dipstick: NEGATIVE
Ketones, ur: NEGATIVE mg/dL
Nitrite: NEGATIVE
Protein, ur: NEGATIVE mg/dL
Specific Gravity, Urine: 1.005 (ref 1.005–1.030)
pH: 7 (ref 5.0–8.0)

## 2019-08-05 LAB — CBC WITH DIFFERENTIAL/PLATELET
Abs Immature Granulocytes: 0.27 10*3/uL — ABNORMAL HIGH (ref 0.00–0.07)
Basophils Absolute: 0.1 10*3/uL (ref 0.0–0.1)
Basophils Relative: 1 %
Eosinophils Absolute: 0.4 10*3/uL (ref 0.0–0.5)
Eosinophils Relative: 4 %
HCT: 32.8 % — ABNORMAL LOW (ref 39.0–52.0)
Hemoglobin: 9.9 g/dL — ABNORMAL LOW (ref 13.0–17.0)
Immature Granulocytes: 2 %
Lymphocytes Relative: 12 %
Lymphs Abs: 1.5 10*3/uL (ref 0.7–4.0)
MCH: 28.1 pg (ref 26.0–34.0)
MCHC: 30.2 g/dL (ref 30.0–36.0)
MCV: 93.2 fL (ref 80.0–100.0)
Monocytes Absolute: 1.3 10*3/uL — ABNORMAL HIGH (ref 0.1–1.0)
Monocytes Relative: 10 %
Neutro Abs: 8.6 10*3/uL — ABNORMAL HIGH (ref 1.7–7.7)
Neutrophils Relative %: 71 %
Platelets: 281 10*3/uL (ref 150–400)
RBC: 3.52 MIL/uL — ABNORMAL LOW (ref 4.22–5.81)
RDW: 15 % (ref 11.5–15.5)
WBC: 12.2 10*3/uL — ABNORMAL HIGH (ref 4.0–10.5)
nRBC: 0 % (ref 0.0–0.2)

## 2019-08-05 LAB — BASIC METABOLIC PANEL
Anion gap: 9 (ref 5–15)
BUN: 23 mg/dL (ref 8–23)
CO2: 29 mmol/L (ref 22–32)
Calcium: 9.1 mg/dL (ref 8.9–10.3)
Chloride: 101 mmol/L (ref 98–111)
Creatinine, Ser: 1.1 mg/dL (ref 0.61–1.24)
GFR calc Af Amer: >60 mL/min (ref >60)
GFR calc non Af Amer: >60 mL/min (ref >60)
Glucose, Bld: 128 mg/dL — ABNORMAL HIGH (ref 70–99)
Potassium: 4.9 mmol/L (ref 3.5–5.1)
Sodium: 139 mmol/L (ref 135–145)

## 2019-08-05 MED ORDER — CEPHALEXIN 500 MG PO CAPS: 500.0000 mg | ORAL_CAPSULE | Freq: Four times a day (QID) | ORAL | 0 refills | Status: AC

## 2019-08-05 MED ORDER — OXYCODONE-ACETAMINOPHEN 5-325 MG PO TABS
2.0000 | ORAL_TABLET | Freq: Once | ORAL | Status: AC
  Administered 2019-08-05: 17:00:00 2 via ORAL
  Filled 2019-08-05: qty 2

## 2019-08-05 NOTE — ED Notes (Signed)
This RN called both son and daughter with no answer. Voicemail  X2

## 2019-08-05 NOTE — ED Notes (Signed)
This RN called Pt's daughter to arrange transport home. Both Daughter and Son are unable to transport patient home. [facility] will be called pending the caretaker can meet at residence.

## 2019-08-05 NOTE — ED Notes (Signed)
Pt transported to [facility] ?

## 2019-08-05 NOTE — ED Notes (Signed)
Per primary nurse unable to get in contact with pt family; several calls have been made per staff. Attempt to contact family so that family could meet [facility] at house to ensure pt safety. Pt has caretaker and is a high fall risk. This Probation officer spoke with daughter who verbalizes that he knows the code to the lock box on his porch and that [facility] could just lay him in his hospital bed. Daughter was educated need for pt safety and his risk for falling without someone consuming care of him when [facility] leaves. Daughter verbalizes that she will be there at 7 pm.  This Probation officer spoke with Liliane Channel at [facility] who verbalizes will pick up pt as close to 6:30 pm as possible so that they arrive to meet the daughter at 7 pm.

## 2019-08-05 NOTE — Discharge Instructions (Signed)
Please call your primary doctor to schedule follow-up appointment.  Please take antibiotic as prescribed.  Please return to ER for any chest pain, difficulty breathing, fever, other new concerning symptom.

## 2019-08-05 NOTE — ED Triage Notes (Signed)
Patient arrived via [facility] from home.   Patient reports trying to get out of his bed this morning and transfer to his motorized wheelchair and slid out of bed.  Patient reports hitting the back of his head on arm rail.   C/o back pain  EMS arrived patient was still in floor. Patient reports being on floor for 1.5 hours. EMS states patient accidentally knocked his urinal over at home and amy be wet.   Denies LOC  Denies blood thinners.   A/Ox4 Wheelchair    Hx: "back problems"   Full Code  BP-162/88, 122/88 P-70 96% CBG-149 97.8 oral

## 2019-08-05 NOTE — ED Notes (Addendum)
This RN spoke with patient Patient's son, Lisandro, who verbalizes he will be at the patient's home at 6:30pm to meet [facility].

## 2019-08-05 NOTE — ED Provider Notes (Signed)
[facility] DEPT Provider Note   CSN: [id number] Arrival date & time: 08/05/19  0935     History   Chief Complaint Chief Complaint  Patient presents with  . Fall    HPI Jorge Sherman is a 78 y.o. male.  Presents the ER after a fall.  Patient reported that he was trying to get out of bed this morning to transfer to motorized wheelchair and slid out of bed, hit the back of his head on right arm rail.  Also complaining of lower back pain.  States pain is mild to moderate severity, no alleviating or aggravating factors, nonradiating.  Sharp stabbing.  States he has had prior surgery on his back.  No loss of consciousness, no blood thinners, denies chest pain or difficulty breathing.  States he has chronic left shoulder pain.  No changes since the fall.  Denies any dysuria, frequency, but states he is concerned he has another urinary tract infection.     HPI  Past Medical History:  Diagnosis Date  . Chronic pain   . Depressive disorder, not elsewhere classified   . Dizziness and giddiness   . Essential and other specified forms of tremor   . Headache(784.0)   . Leukocytosis, unspecified   . Obstructive sleep apnea (adult) (pediatric)   . Pain in joint, lower leg   . Septic joint of left knee joint ([place]) 04/22/2019  . Spinal stenosis, lumbar region, without neurogenic claudication   . Thoracic or lumbosacral neuritis or radiculitis, unspecified   . Type II or unspecified type diabetes mellitus without mention of complication, not stated as uncontrolled   . Unspecified cardiovascular disease   . Unspecified essential hypertension     Patient Active Problem List   Diagnosis Date Noted  . Septic joint of left knee joint ([place]) 04/22/2019  . Erectile dysfunction 10/30/2013  . Essential hypertension, benign 10/30/2013  . Coronary atherosclerosis of native coronary artery 10/30/2013  . Dizziness and giddiness   . Pain in joint, lower leg   .  Leukocytosis, unspecified   . Type II or unspecified type diabetes mellitus without mention of complication, not stated as uncontrolled   . Obstructive sleep apnea   . Depressive disorder, not elsewhere classified   . Essential and other specified forms of tremor   . Headache(784.0)   . Spinal stenosis, lumbar region, without neurogenic claudication   . Thoracic or lumbosacral neuritis or radiculitis, unspecified   . Chronic pain     Past Surgical History:  Procedure Laterality Date  . BACK SURGERY    . back surgey     times 3        Home Medications    Prior to Admission medications   Medication Sig Start Date End Date Taking? Authorizing Provider  amLODipine (NORVASC) 2.5 MG tablet Take 2.5 mg by mouth daily.    [provider]  bisacodyl (DULCOLAX) 10 MG suppository Place 10 mg rectally daily as needed for moderate constipation.    [provider]  celecoxib (CELEBREX) 200 MG capsule Take 200 mg by mouth 2 (two) times daily.    [provider]  cloNIDine (CATAPRES) 0.1 MG tablet Take 0.1 mg by mouth 2 (two) times daily.    [provider]  clotrimazole-betamethasone (LOTRISONE) cream Apply 1 application topically 2 (two) times daily.    [provider]  docusate sodium (COLACE) 100 MG capsule Take 100 mg by mouth daily.    [provider]  DULoxetine (  CYMBALTA) 30 MG capsule Take 30 mg by mouth 3 (three) times daily.     [provider]  ferrous sulfate 325 (65 FE) MG tablet Take 325 mg by mouth daily with breakfast.    [provider]  furosemide (LASIX) 20 MG tablet Take 20 mg by mouth 2 (two) times daily.    [provider]  gabapentin (NEURONTIN) 800 MG tablet Take 800 mg by mouth 3 (three) times daily.     [provider]  hydroxychloroquine (PLAQUENIL) 200 MG tablet Take 200 mg by mouth 2 (two) times daily.    [provider]  LORazepam (ATIVAN) 1 MG tablet Take 1 mg by  mouth every 8 (eight) hours as needed for anxiety.     [provider]  magnesium hydroxide (MILK OF MAGNESIA) 400 MG/5ML suspension Take 30 mLs by mouth daily as needed for mild constipation.    [provider]  methocarbamol (ROBAXIN) 500 MG tablet Take 500 mg by mouth 3 (three) times daily as needed for muscle spasms.    [provider]  olmesartan (BENICAR) 20 MG tablet Take 20 mg by mouth daily.     [provider]  omeprazole (PRILOSEC) 40 MG capsule Take 40 mg by mouth daily.    [provider]  ondansetron (ZOFRAN) 4 MG tablet Take 4 mg by mouth every 8 (eight) hours as needed for nausea or vomiting.    [provider]  oxyCODONE-acetaminophen (PERCOCET) 10-325 MG tablet Take 1 tablet by mouth every 4 (four) hours as needed for pain.    [provider]  polyethylene glycol (MIRALAX / GLYCOLAX) 17 g packet Take 17 g by mouth daily as needed for mild constipation.    [provider]  rosuvastatin (CRESTOR) 40 MG tablet Take 40 mg by mouth every evening.     [provider]  tamsulosin (FLOMAX) 0.4 MG CAPS capsule Take 0.4 mg by mouth daily.    [provider]    Family History Family History  Problem Relation Age of Onset  . Cancer Father   . Heart disease Father   . Stroke Mother   . Diabetes Mother     Social History Social History   Tobacco Use  . Smoking status: Former Research scientist (life sciences)  . Smokeless tobacco: Never Used  Substance Use Topics  . Alcohol use: No  . Drug use: No     Allergies   Iohexol, Fentanyl, and Iodine   Review of Systems Review of Systems  Constitutional: Negative for chills and fever.  HENT: Negative for ear pain and sore throat.   Eyes: Negative for pain and visual disturbance.  Respiratory: Negative for cough and shortness of breath.   Cardiovascular: Negative for chest pain and palpitations.  Gastrointestinal: Negative for abdominal pain and vomiting.   Genitourinary: Negative for dysuria and hematuria.  Musculoskeletal: Positive for arthralgias and back pain.  Skin: Negative for color change and rash.  Neurological: Negative for seizures and syncope.  All other systems reviewed and are negative.    Physical Exam Updated Vital Signs BP (!) 165/93   Pulse 95   Temp 97.6 F (36.4 C) (Oral)   Resp 15   SpO2 100%   Physical Exam Vitals signs and nursing note reviewed.  Constitutional:      Appearance: He is well-developed.  HENT:     Head: Normocephalic and atraumatic.  Eyes:     Conjunctiva/sclera: Conjunctivae normal.  Neck:     Musculoskeletal: Neck supple.  Cardiovascular:     Rate and Rhythm: Normal rate and regular rhythm.     Heart sounds: No murmur.  Pulmonary:     Effort: Pulmonary effort is normal. No respiratory distress.     Breath sounds: Normal breath sounds.  Abdominal:     Palpations: Abdomen is soft.     Tenderness: There is no abdominal tenderness.  Musculoskeletal:     Comments: Noted tenderness to palpation over lower L-spine, no T or C-spine tenderness  RUE: no deformity or tenderness throughout extremity LUE: TTP over shoulder; no TTP throughout remainder of extremity RLE: no deformity or tenderness throughout extremity LLE: no deformity or tenderness throughout extremity  Skin:    General: Skin is warm and dry.     Capillary Refill: Capillary refill takes less than 2 seconds.  Neurological:     General: No focal deficit present.     Mental Status: He is alert and oriented to person, place, and time.      ED Treatments / Results  Labs (all labs ordered are listed, but only abnormal results are displayed) Labs Reviewed  URINALYSIS, ROUTINE W REFLEX MICROSCOPIC  CBC WITH DIFFERENTIAL/PLATELET  BASIC METABOLIC PANEL    EKG EKG Interpretation  Date/Time:  Monday August 05 2019 10:13:02 EDT Ventricular Rate:  90 PR Interval:    QRS Duration: 90 QT Interval:  366 QTC Calculation:  451 R Axis:   -22 Text Interpretation:  Sinus rhythm Borderline left axis deviation Low voltage, precordial leads Nonspecific T abnormalities, lateral leads Confirmed by Madalyn Rob [phone]) on 08/05/2019 10:55:20 AM   Radiology No results found.  Procedures Procedures (including critical care time)  Medications Ordered in ED Medications - No data to display   Initial Impression / Assessment and Plan / ED Course  I have reviewed the triage vital signs and the nursing notes.  Pertinent labs & imaging results that were available during my care of the patient were reviewed by me and considered in my medical decision making (see chart for details).  Clinical Course as of Aug 05 1431  Mon Aug 05, 2019  1428 Notified by radiology staff patient refusing CT head, I assess patient, expressed my concerns and need for emergent CT head imaging to rule out acute head trauma including life-threatening bleed, patient adamant that his fall was minor and he acknowledges my concerns and does not want to pursue head imaging at this time and wishes to be discharged home, AAOx3, believe capable of making such decision, will dc home   [RD]    Clinical Course User Index [RD] Lucrezia Starch, MD       78 year old male who presents emergency department after a fall, primary concern is low back pain.  On exam patient was well-appearing in no acute distress, did note some L-spine tenderness.  No other significant trauma was noted on my exam.  CT abdomen pelvis negative for acute traumatic process.  Patient also had some suprapubic tenderness and concern he may have another urinary tract infection.  UA was concerning for infection.  Will prescribe cephalexin course and recommend recheck with PCP.  Given patient reported he hit his head during fall, I strongly recommended obtaining CT head to rule out acute intracranial traumatic process, however patient adamant did not want to pursue this testing, alert and  oriented x3, believe capable of making such decision.  Will discharge home.  Reviewed return precautions in detail with patient.  Final Clinical Impressions(s) / ED Diagnoses   Final  diagnoses:  Fall, initial encounter  Strain of lumbar region, initial encounter  Urinary tract infection without hematuria, site unspecified    ED Discharge Orders    None       Lucrezia Starch, MD 08/05/19 1434

## 2019-08-05 NOTE — ED Notes (Signed)
This RN called Caregiver Marita [place] who notfie she could not be present at drop off for pt.

## 2019-08-05 NOTE — ED Notes (Signed)
Patient given sandwich and applesauce and water for dinner.

## 2019-08-07 LAB — URINE CULTURE: Culture: 20000 — AB

## 2019-08-08 ENCOUNTER — Telehealth: Payer: Self-pay

## 2019-08-08 NOTE — Telephone Encounter (Signed)
Post ED Visit - Positive Culture Follow-up: Unsuccessful Patient Follow-up  Culture assessed and recommendations reviewed by:  []  Elenor Quinones, Pharm.D. []  Heide Guile, Pharm.D., BCPS AQ-ID []  Parks Neptune, Pharm.D., BCPS []  Alycia Rossetti, Pharm.D., BCPS []  Hobbs, Pharm.D., BCPS, AAHIVP []  Legrand Como, Pharm.D., BCPS, AAHIVP []  Wynell Balloon, PharmD []  Vincenza Hews, PharmD, BCPS Anh Pham Pharm D Positive urine culture Symptom check  May need new abx []  Patient discharged without antimicrobial prescription and treatment is now indicated [x]  Organism is resistant to prescribed ED discharge antimicrobial []  Patient with positive blood cultures   Unable to contact patient after 3 attempts, letter will be sent to address on file  Genia Del 08/08/2019, 10:03 AM

## 2019-08-08 NOTE — Progress Notes (Signed)
ED Antimicrobial Stewardship Positive Culture Follow Up   Jorge Sherman is an 78 y.o. male who presented to [facility] on 08/05/2019 with a chief complaint of back pain s/p fall.  Chief Complaint  Patient presents with  . Fall    Recent Results (from the past 720 hour(s))  Urine culture     Status: Abnormal   Collection Time: 08/05/19 12:12 PM   Specimen: Urine, Random  Result Value Ref Range Status   Specimen Description   Final    URINE, RANDOM Performed at [facility] [address] [place], [place] [address]    Special Requests   Final    NONE Performed at [facility], [place] [address], [place], [place] [address]    Culture (A)  Final    20,000 COLONIES/mL VANCOMYCIN RESISTANT ENTEROCOCCUS ISOLATED   Report Status 08/07/2019 FINAL  Final   Organism ID, Bacteria VANCOMYCIN RESISTANT ENTEROCOCCUS ISOLATED (A)  Final      Susceptibility   Vancomycin resistant enterococcus isolated - MIC*    AMPICILLIN <=2 SENSITIVE Sensitive     LEVOFLOXACIN >=8 RESISTANT Resistant     NITROFURANTOIN <=16 SENSITIVE Sensitive     VANCOMYCIN >=32 RESISTANT Resistant     LINEZOLID 2 SENSITIVE Sensitive     * 20,000 COLONIES/mL VANCOMYCIN RESISTANT ENTEROCOCCUS ISOLATED    [x]  Treated with cephalexin, organism resistant to prescribed antimicrobial []  Patient discharged originally without antimicrobial agent and treatment is now indicated  Plan: 1) d/c cephalexin 2) ask patient if he has urinary symptoms 3) if patient is symptomatic, then start amoxicillin 500 mg PO q8h x7 days   ED Provider: Providence Lanius, PA-C   Dia Sitter P 08/08/2019, 9:41 AM Clinical Pharmacist [phone]

## 2019-08-28 ENCOUNTER — Encounter (HOSPITAL_COMMUNITY): Payer: Self-pay | Admitting: Obstetrics and Gynecology

## 2019-08-28 ENCOUNTER — Inpatient Hospital Stay (HOSPITAL_COMMUNITY)
Admission: EM | Admit: 2019-08-28 | Discharge: 2019-08-30 | DRG: 092 | Disposition: A | Discharge status: Home Health | Payer: Medicare Other | Attending: Family Medicine | Admitting: Family Medicine

## 2019-08-28 ENCOUNTER — Other Ambulatory Visit: Payer: Self-pay

## 2019-08-28 ENCOUNTER — Emergency Department (HOSPITAL_COMMUNITY): Payer: Medicare Other

## 2019-08-28 DIAGNOSIS — Z87891 Personal history of nicotine dependence: Secondary | ICD-10-CM

## 2019-08-28 DIAGNOSIS — Z20828 Contact with and (suspected) exposure to other viral communicable diseases: Secondary | ICD-10-CM | POA: Diagnosis present

## 2019-08-28 DIAGNOSIS — Z79899 Other long term (current) drug therapy: Secondary | ICD-10-CM | POA: Diagnosis not present

## 2019-08-28 DIAGNOSIS — Z66 Do not resuscitate: Secondary | ICD-10-CM | POA: Diagnosis present

## 2019-08-28 DIAGNOSIS — N3000 Acute cystitis without hematuria: Secondary | ICD-10-CM | POA: Diagnosis present

## 2019-08-28 DIAGNOSIS — G92 Toxic encephalopathy: Principal | ICD-10-CM | POA: Diagnosis present

## 2019-08-28 DIAGNOSIS — G894 Chronic pain syndrome: Secondary | ICD-10-CM | POA: Diagnosis present

## 2019-08-28 DIAGNOSIS — M069 Rheumatoid arthritis, unspecified: Secondary | ICD-10-CM | POA: Diagnosis present

## 2019-08-28 DIAGNOSIS — D649 Anemia, unspecified: Secondary | ICD-10-CM | POA: Diagnosis present

## 2019-08-28 DIAGNOSIS — G4733 Obstructive sleep apnea (adult) (pediatric): Secondary | ICD-10-CM | POA: Diagnosis present

## 2019-08-28 DIAGNOSIS — Z87440 Personal history of urinary (tract) infections: Secondary | ICD-10-CM | POA: Diagnosis not present

## 2019-08-28 DIAGNOSIS — I2510 Atherosclerotic heart disease of native coronary artery without angina pectoris: Secondary | ICD-10-CM | POA: Diagnosis present

## 2019-08-28 DIAGNOSIS — T428X5A Adverse effect of antiparkinsonism drugs and other central muscle-tone depressants, initial encounter: Secondary | ICD-10-CM | POA: Diagnosis present

## 2019-08-28 DIAGNOSIS — R4182 Altered mental status, unspecified: Secondary | ICD-10-CM | POA: Diagnosis present

## 2019-08-28 DIAGNOSIS — E871 Hypo-osmolality and hyponatremia: Secondary | ICD-10-CM | POA: Diagnosis present

## 2019-08-28 DIAGNOSIS — R490 Dysphonia: Secondary | ICD-10-CM

## 2019-08-28 DIAGNOSIS — T40495A Adverse effect of other synthetic narcotics, initial encounter: Secondary | ICD-10-CM | POA: Diagnosis present

## 2019-08-28 DIAGNOSIS — I10 Essential (primary) hypertension: Secondary | ICD-10-CM | POA: Diagnosis present

## 2019-08-28 DIAGNOSIS — T40415A Adverse effect of fentanyl or fentanyl analogs, initial encounter: Secondary | ICD-10-CM | POA: Diagnosis present

## 2019-08-28 DIAGNOSIS — Z9119 Patient's noncompliance with other medical treatment and regimen: Secondary | ICD-10-CM

## 2019-08-28 DIAGNOSIS — E119 Type 2 diabetes mellitus without complications: Secondary | ICD-10-CM | POA: Diagnosis present

## 2019-08-28 DIAGNOSIS — N401 Enlarged prostate with lower urinary tract symptoms: Secondary | ICD-10-CM | POA: Diagnosis present

## 2019-08-28 DIAGNOSIS — I959 Hypotension, unspecified: Secondary | ICD-10-CM | POA: Diagnosis present

## 2019-08-28 DIAGNOSIS — T424X5A Adverse effect of benzodiazepines, initial encounter: Secondary | ICD-10-CM | POA: Diagnosis present

## 2019-08-28 DIAGNOSIS — E861 Hypovolemia: Secondary | ICD-10-CM | POA: Diagnosis present

## 2019-08-28 DIAGNOSIS — F329 Major depressive disorder, single episode, unspecified: Secondary | ICD-10-CM | POA: Diagnosis present

## 2019-08-28 DIAGNOSIS — E875 Hyperkalemia: Secondary | ICD-10-CM | POA: Diagnosis present

## 2019-08-28 DIAGNOSIS — D638 Anemia in other chronic diseases classified elsewhere: Secondary | ICD-10-CM

## 2019-08-28 DIAGNOSIS — T426X5A Adverse effect of other antiepileptic and sedative-hypnotic drugs, initial encounter: Secondary | ICD-10-CM | POA: Diagnosis present

## 2019-08-28 DIAGNOSIS — R1310 Dysphagia, unspecified: Secondary | ICD-10-CM | POA: Diagnosis present

## 2019-08-28 LAB — CBC
HCT: 31.8 % — ABNORMAL LOW (ref 39.0–52.0)
Hemoglobin: 9.7 g/dL — ABNORMAL LOW (ref 13.0–17.0)
MCH: 27.8 pg (ref 26.0–34.0)
MCHC: 30.5 g/dL (ref 30.0–36.0)
MCV: 91.1 fL (ref 80.0–100.0)
Platelets: 305 10*3/uL (ref 150–400)
RBC: 3.49 MIL/uL — ABNORMAL LOW (ref 4.22–5.81)
RDW: 14.7 % (ref 11.5–15.5)
WBC: 12.3 10*3/uL — ABNORMAL HIGH (ref 4.0–10.5)
nRBC: 0 % (ref 0.0–0.2)

## 2019-08-28 LAB — CBC WITH DIFFERENTIAL/PLATELET
Abs Immature Granulocytes: 0.23 10*3/uL — ABNORMAL HIGH (ref 0.00–0.07)
Basophils Absolute: 0.1 10*3/uL (ref 0.0–0.1)
Basophils Relative: 1 %
Eosinophils Absolute: 0.5 10*3/uL (ref 0.0–0.5)
Eosinophils Relative: 4 %
HCT: 31 % — ABNORMAL LOW (ref 39.0–52.0)
Hemoglobin: 9.5 g/dL — ABNORMAL LOW (ref 13.0–17.0)
Immature Granulocytes: 2 %
Lymphocytes Relative: 11 %
Lymphs Abs: 1.5 10*3/uL (ref 0.7–4.0)
MCH: 28.2 pg (ref 26.0–34.0)
MCHC: 30.6 g/dL (ref 30.0–36.0)
MCV: 92 fL (ref 80.0–100.0)
Monocytes Absolute: 1.5 10*3/uL — ABNORMAL HIGH (ref 0.1–1.0)
Monocytes Relative: 11 %
Neutro Abs: 9.6 10*3/uL — ABNORMAL HIGH (ref 1.7–7.7)
Neutrophils Relative %: 71 %
Platelets: 334 10*3/uL (ref 150–400)
RBC: 3.37 MIL/uL — ABNORMAL LOW (ref 4.22–5.81)
RDW: 14.6 % (ref 11.5–15.5)
WBC: 13.4 10*3/uL — ABNORMAL HIGH (ref 4.0–10.5)
nRBC: 0 % (ref 0.0–0.2)

## 2019-08-28 LAB — COMPREHENSIVE METABOLIC PANEL
ALT: 14 U/L (ref 0–44)
AST: 14 U/L — ABNORMAL LOW (ref 15–41)
Albumin: 3.7 g/dL (ref 3.5–5.0)
Alkaline Phosphatase: 64 U/L (ref 38–126)
Anion gap: 7 (ref 5–15)
BUN: 35 mg/dL — ABNORMAL HIGH (ref 8–23)
CO2: 26 mmol/L (ref 22–32)
Calcium: 8.1 mg/dL — ABNORMAL LOW (ref 8.9–10.3)
Chloride: 94 mmol/L — ABNORMAL LOW (ref 98–111)
Creatinine, Ser: 1.79 mg/dL — ABNORMAL HIGH (ref 0.61–1.24)
GFR calc Af Amer: 41 mL/min — ABNORMAL LOW (ref >60)
GFR calc non Af Amer: 36 mL/min — ABNORMAL LOW (ref >60)
Glucose, Bld: 100 mg/dL — ABNORMAL HIGH (ref 70–99)
Potassium: 5.9 mmol/L — ABNORMAL HIGH (ref 3.5–5.1)
Sodium: 127 mmol/L — ABNORMAL LOW (ref 135–145)
Total Bilirubin: 0.5 mg/dL (ref 0.3–1.2)
Total Protein: 6.5 g/dL (ref 6.5–8.1)

## 2019-08-28 LAB — BLOOD GAS, VENOUS
Acid-base deficit: 1.5 mmol/L (ref 0.0–2.0)
Bicarbonate: 26.6 mmol/L (ref 20.0–28.0)
O2 Saturation: 61.8 %
Patient temperature: 98.6
pCO2, Ven: 65.8 mmHg — ABNORMAL HIGH (ref 44.0–60.0)
pH, Ven: 7.231 — ABNORMAL LOW (ref 7.250–7.430)
pO2, Ven: 38.1 mmHg (ref 32.0–45.0)

## 2019-08-28 LAB — URINALYSIS, ROUTINE W REFLEX MICROSCOPIC
Bilirubin Urine: NEGATIVE
Glucose, UA: NEGATIVE mg/dL
Hgb urine dipstick: NEGATIVE
Ketones, ur: NEGATIVE mg/dL
Leukocytes,Ua: NEGATIVE
Nitrite: NEGATIVE
Protein, ur: NEGATIVE mg/dL
Specific Gravity, Urine: 1.012 (ref 1.005–1.030)
pH: 6 (ref 5.0–8.0)

## 2019-08-28 LAB — CREATININE, SERUM
Creatinine, Ser: 1.93 mg/dL — ABNORMAL HIGH (ref 0.61–1.24)
GFR calc Af Amer: 38 mL/min — ABNORMAL LOW (ref >60)
GFR calc non Af Amer: 32 mL/min — ABNORMAL LOW (ref >60)

## 2019-08-28 LAB — SARS CORONAVIRUS 2 BY RT PCR (HOSPITAL ORDER, PERFORMED IN ~~LOC~~ HOSPITAL LAB): SARS Coronavirus 2: NEGATIVE

## 2019-08-28 LAB — LACTIC ACID, PLASMA: Lactic Acid, Venous: 0.6 mmol/L (ref 0.5–1.9)

## 2019-08-28 MED ORDER — SODIUM CHLORIDE 0.9 % IV SOLN
2.0000 g | Freq: Once | INTRAVENOUS | Status: AC
  Administered 2019-08-28: 2 g via INTRAVENOUS
  Filled 2019-08-28: qty 20

## 2019-08-28 MED ORDER — SODIUM CHLORIDE 0.9 % IV SOLN
2.0000 g | Freq: Once | INTRAVENOUS | Status: DC
  Filled 2019-08-28: qty 2000

## 2019-08-28 MED ORDER — ENOXAPARIN SODIUM 40 MG/0.4ML ~~LOC~~ SOLN
40.0000 mg | Freq: Every day | SUBCUTANEOUS | Status: DC
  Administered 2019-08-28: 40 mg via SUBCUTANEOUS
  Filled 2019-08-28: qty 0.4

## 2019-08-28 MED ORDER — SODIUM CHLORIDE 0.9 % IV BOLUS
1000.0000 mL | Freq: Once | INTRAVENOUS | Status: AC
  Administered 2019-08-28: 1000 mL via INTRAVENOUS

## 2019-08-28 NOTE — ED Notes (Signed)
Hospitalist at bedside 

## 2019-08-28 NOTE — ED Triage Notes (Signed)
Pt BIBA from home.  Per EMS-  Caregiver called EMS d/t pt AMS since Monday, at which time he was dx with UTI.  Pt AOx4 at time of EMS arrival today.    BP- 70/38--> Given 250 mL NaCl- BP now 90/68.  Pt denies pain.  C/o lethargy. Pt wheelchair bound.

## 2019-08-28 NOTE — ED Provider Notes (Signed)
[facility] DEPT Provider Note   CSN: [id number] Arrival date & time: 08/28/19  1453     History   Chief Complaint Chief Complaint  Patient presents with  . Hypotension    HPI Jorge Sherman is a 78 y.o. male.     78 yo M with a cc of fatigue.  Sent to PCP office and found to be hypotensive.  Given some fluid with improvement. Seen a week ago and started on meds for UTI.      The history is provided by the patient.  Illness Severity:  Moderate Onset quality:  Gradual Duration:  1 week Timing:  Constant Progression:  Worsening Chronicity:  New Associated symptoms: cough (from time to time)   Associated symptoms: no abdominal pain, no chest pain, no congestion, no diarrhea, no fever, no headaches, no myalgias, no rash, no shortness of breath and no vomiting     Past Medical History:  Diagnosis Date  . Chronic pain   . Depressive disorder, not elsewhere classified   . Dizziness and giddiness   . Essential and other specified forms of tremor   . Headache(784.0)   . Leukocytosis, unspecified   . Obstructive sleep apnea (adult) (pediatric)   . Pain in joint, lower leg   . Septic joint of left knee joint ([place]) 04/22/2019  . Spinal stenosis, lumbar region, without neurogenic claudication   . Thoracic or lumbosacral neuritis or radiculitis, unspecified   . Type II or unspecified type diabetes mellitus without mention of complication, not stated as uncontrolled   . Unspecified cardiovascular disease   . Unspecified essential hypertension     Patient Active Problem List   Diagnosis Date Noted  . Altered mental status 08/28/2019  . Septic joint of left knee joint ([place]) 04/22/2019  . Erectile dysfunction 10/30/2013  . Essential hypertension, benign 10/30/2013  . Coronary atherosclerosis of native coronary artery 10/30/2013  . Dizziness and giddiness   . Pain in joint, lower leg   . Leukocytosis, unspecified   . Type II or unspecified  type diabetes mellitus without mention of complication, not stated as uncontrolled   . Obstructive sleep apnea   . Depressive disorder, not elsewhere classified   . Essential and other specified forms of tremor   . Headache(784.0)   . Spinal stenosis, lumbar region, without neurogenic claudication   . Thoracic or lumbosacral neuritis or radiculitis, unspecified   . Chronic pain     Past Surgical History:  Procedure Laterality Date  . BACK SURGERY    . back surgey     times 3        Home Medications    Prior to Admission medications   Medication Sig Start Date End Date Taking? Authorizing Provider  amLODipine (NORVASC) 2.5 MG tablet Take 2.5 mg by mouth daily.    [provider]  bisacodyl (DULCOLAX) 10 MG suppository Place 10 mg rectally daily as needed for moderate constipation.    [provider]  celecoxib (CELEBREX) 200 MG capsule Take 200 mg by mouth 2 (two) times daily.    [provider]  cloNIDine (CATAPRES) 0.1 MG tablet Take 0.1 mg by mouth 2 (two) times daily.    [provider]  clotrimazole-betamethasone (LOTRISONE) cream Apply 1 application topically 2 (two) times daily.    [provider]  docusate sodium (COLACE) 100 MG capsule Take 100 mg by mouth daily.    [provider]  DULoxetine (CYMBALTA) 30 MG capsule Take 30 mg  by mouth 3 (three) times daily.     [provider]  ferrous sulfate 325 (65 FE) MG tablet Take 325 mg by mouth daily with breakfast.    [provider]  furosemide (LASIX) 20 MG tablet Take 20 mg by mouth 2 (two) times daily.    [provider]  gabapentin (NEURONTIN) 800 MG tablet Take 800 mg by mouth 3 (three) times daily.     [provider]  hydroxychloroquine (PLAQUENIL) 200 MG tablet Take 200 mg by mouth 2 (two) times daily.    [provider]  LORazepam (ATIVAN) 1 MG tablet Take 1 mg by mouth every 8 (eight) hours as needed for anxiety.      [provider]  magnesium hydroxide (MILK OF MAGNESIA) 400 MG/5ML suspension Take 30 mLs by mouth daily as needed for mild constipation.    [provider]  methocarbamol (ROBAXIN) 500 MG tablet Take 500 mg by mouth 3 (three) times daily as needed for muscle spasms.    [provider]  olmesartan (BENICAR) 20 MG tablet Take 20 mg by mouth daily.     [provider]  omeprazole (PRILOSEC) 40 MG capsule Take 40 mg by mouth daily.    [provider]  ondansetron (ZOFRAN) 4 MG tablet Take 4 mg by mouth every 8 (eight) hours as needed for nausea or vomiting.    [provider]  oxyCODONE-acetaminophen (PERCOCET) 10-325 MG tablet Take 1 tablet by mouth every 4 (four) hours as needed for pain.    [provider]  polyethylene glycol (MIRALAX / GLYCOLAX) 17 g packet Take 17 g by mouth daily as needed for mild constipation.    [provider]  rosuvastatin (CRESTOR) 40 MG tablet Take 40 mg by mouth every evening.     [provider]  tamsulosin (FLOMAX) 0.4 MG CAPS capsule Take 0.4 mg by mouth daily.    [provider]    Family History Family History  Problem Relation Age of Onset  . Cancer Father   . Heart disease Father   . Stroke Mother   . Diabetes Mother     Social History Social History   Tobacco Use  . Smoking status: Former Research scientist (life sciences)  . Smokeless tobacco: Never Used  Substance Use Topics  . Alcohol use: No  . Drug use: No     Allergies   Iohexol, Fentanyl, and Iodine   Review of Systems Review of Systems  Constitutional: Negative for chills and fever.  HENT: Negative for congestion and facial swelling.   Eyes: Negative for discharge and visual disturbance.  Respiratory: Positive for cough (from time to time). Negative for shortness of breath.   Cardiovascular: Negative for chest pain and palpitations.  Gastrointestinal: Negative for abdominal pain, diarrhea and vomiting.   Genitourinary: Positive for dysuria.  Musculoskeletal: Negative for arthralgias and myalgias.  Skin: Negative for color change and rash.  Neurological: Negative for tremors, syncope and headaches.  Psychiatric/Behavioral: Negative for confusion and dysphoric mood.     Physical Exam Updated Vital Signs BP (!) 131/98 (BP Location: Left Arm)   Pulse 81   Temp (!) 97.5 F (36.4 C) (Oral)   Resp 20   SpO2 100%   Physical Exam Vitals signs and nursing note reviewed.  Constitutional:      Appearance: He is well-developed. He is obese.     Comments: Sleepy on exam  HENT:     Head: Normocephalic and atraumatic.  Eyes:  Pupils: Pupils are equal, round, and reactive to light.  Neck:     Musculoskeletal: Normal range of motion and neck supple.     Vascular: No JVD.  Cardiovascular:     Rate and Rhythm: Normal rate and regular rhythm.     Heart sounds: No murmur. No friction rub. No gallop.   Pulmonary:     Effort: No respiratory distress.     Breath sounds: No wheezing.  Abdominal:     General: There is no distension.     Tenderness: There is no guarding or rebound.  Musculoskeletal: Normal range of motion.  Skin:    Coloration: Skin is not pale.     Findings: No rash.  Neurological:     Mental Status: He is alert and oriented to person, place, and time.  Psychiatric:        Behavior: Behavior normal.      ED Treatments / Results  Labs (all labs ordered are listed, but only abnormal results are displayed) Labs Reviewed  COMPREHENSIVE METABOLIC PANEL - Abnormal; Notable for the following components:      Result Value   Sodium 127 (*)    Potassium 5.9 (*)    Chloride 94 (*)    Glucose, Bld 100 (*)    BUN 35 (*)    Creatinine, Ser 1.79 (*)    Calcium 8.1 (*)    AST 14 (*)    GFR calc non Af Amer 36 (*)    GFR calc Af Amer 41 (*)    All other components within normal limits  CBC WITH DIFFERENTIAL/PLATELET - Abnormal; Notable for the following components:   WBC  13.4 (*)    RBC 3.37 (*)    Hemoglobin 9.5 (*)    HCT 31.0 (*)    Neutro Abs 9.6 (*)    Monocytes Absolute 1.5 (*)    Abs Immature Granulocytes 0.23 (*)    All other components within normal limits  BLOOD GAS, VENOUS - Abnormal; Notable for the following components:   pH, Ven 7.231 (*)    pCO2, Ven 65.8 (*)    All other components within normal limits  SARS CORONAVIRUS 2 (HOSPITAL ORDER, [facility])  CULTURE, BLOOD (ROUTINE X 2)  CULTURE, BLOOD (ROUTINE X 2)  URINE CULTURE  LACTIC ACID, PLASMA  URINALYSIS, ROUTINE W REFLEX MICROSCOPIC  CBC  CREATININE, SERUM  BASIC METABOLIC PANEL  CBC  I-STAT VENOUS BLOOD GAS, ED    EKG EKG Interpretation  Date/Time:  Wednesday August 28 2019 15:36:01 EDT Ventricular Rate:  72 PR Interval:    QRS Duration: 101 QT Interval:  419 QTC Calculation: 456 R Axis:   -28 Text Interpretation:  Sinus rhythm Ventricular premature complex TECHNICALLY DIFFICULT Baseline wander No significant change since last tracing Confirmed by Deno Etienne [phone]) on 08/28/2019 3:58:30 PM   Radiology Ct Head Wo Contrast  Result Date: 08/28/2019 CLINICAL DATA:  Altered level of consciousness. EXAM: CT HEAD WITHOUT CONTRAST TECHNIQUE: Contiguous axial images were obtained from the base of the skull through the vertex without intravenous contrast. COMPARISON:  03/14/2013. FINDINGS: Brain: No evidence of acute infarction, hemorrhage, hydrocephalus, extra-axial collection or mass lesion/mass effect. Scattered low-density changes within the periventricular and subcortical white matter compatible with chronic microvascular ischemic change. Mild diffuse cerebral volume loss. Vascular: Mild atherosclerotic calcifications involving the large vessels of the skull base. No unexpected hyperdense vessel. Skull: Normal. Negative for fracture or focal lesion. Sinuses/Orbits: No acute finding. Other: None. IMPRESSION:  1.  No acute intracranial findings. 2.   Chronic microvascular ischemic change and cerebral volume loss. Electronically Signed   By: Davina Poke M.D.   On: 08/28/2019 16:52   Dg Chest Port 1 View  Result Date: 08/28/2019 CLINICAL DATA:  Cough fatigue, hypotension EXAM: PORTABLE CHEST 1 VIEW COMPARISON:  08/05/2019 FINDINGS: There is no focal consolidation. There is no pleural effusion or pneumothorax. The heart and mediastinal contours are unremarkable. There is no acute osseous abnormality. IMPRESSION: No active disease. Electronically Signed   By: Kathreen Devoid   On: 08/28/2019 16:00    Procedures Procedures (including critical care time)  Medications Ordered in ED Medications  ampicillin (OMNIPEN) 2 g in sodium chloride 0.9 % 100 mL IVPB (has no administration in time range)  enoxaparin (LOVENOX) injection 40 mg (has no administration in time range)  sodium chloride 0.9 % bolus 1,000 mL (0 mLs Intravenous Stopped 08/28/19 2005)  cefTRIAXone (ROCEPHIN) 2 g in sodium chloride 0.9 % 100 mL IVPB (0 g Intravenous Stopped 08/28/19 1909)     Initial Impression / Assessment and Plan / ED Course  I have reviewed the triage vital signs and the nursing notes.  Pertinent labs & imaging results that were available during my care of the patient were reviewed by me and considered in my medical decision making (see chart for details).        78 yo M with a chief complaint of fatigue.  Found to be hypotensive in the office and sent here for evaluation.  Patient was seen in the office about a week ago and diagnosed with a urinary tract infection.  For some reason he was started on dual antibiotic therapy of Bactrim and Macrobid.  Patient is very sleepy on my exam.  Will obtain a VBG.  Chest x-ray lab work bolus of IV fluids and reassess.  Patient's blood pressure has been stable.  He still is very sleepy.  VBG with very modest if any elevation of the PCO2.  Patient is able to have a conversation with me though still frequently will fall  asleep.  CT scan of the head is negative for acute pathology.  UA looks clean though I must assume since he has been having recurrent urinary tract infections and was recently seen and started on antibiotics for this that that is the most likely culprit.  He has had multiple UTIs in the past 3 months.  Some has been very resistant organisms.  I discussed this with the pharmacist who recommended giving Rocephin and ampicillin to treat some of his most recent cultures.  With his transient hypotension I feel he would be unsafe to be discharged.  He also has a mild hyperkalemia without any EKG changes.  Will discuss with hospitalist for admission.  The patient is noted to have a MAP's <65/ SBP's <90. With the current information available to me, I don't think the patient is in septic shock. The MAP's <65/ SBP's <90, is related to [place] .   CRITICAL CARE Performed by: Cecilio Asper   Total critical care time: 35 minutes  Critical care time was exclusive of separately billable procedures and treating other patients.  Critical care was necessary to treat or prevent imminent or life-threatening deterioration.  Critical care was time spent personally by me on the following activities: development of treatment plan with patient and/or surrogate as well as nursing, discussions with consultants, evaluation of patient's response to treatment, examination of patient, obtaining history from patient  or surrogate, ordering and performing treatments and interventions, ordering and review of laboratory studies, ordering and review of radiographic studies, pulse oximetry and re-evaluation of patient's condition.  The patients results and plan were reviewed and discussed.   Any x-rays performed were independently reviewed by myself.   Differential diagnosis were considered with the presenting HPI.  Medications  ampicillin (OMNIPEN) 2 g in sodium chloride 0.9 % 100 mL IVPB (has no  administration in time range)  enoxaparin (LOVENOX) injection 40 mg (has no administration in time range)  sodium chloride 0.9 % bolus 1,000 mL (0 mLs Intravenous Stopped 08/28/19 2005)  cefTRIAXone (ROCEPHIN) 2 g in sodium chloride 0.9 % 100 mL IVPB (0 g Intravenous Stopped 08/28/19 1909)    Vitals:   08/28/19 1503 08/28/19 1512 08/28/19 1800 08/28/19 2101  BP:  102/77 103/72 (!) 131/98  Pulse:  77 77 81  Resp:  19 (!) 21 20  Temp:  97.8 F (36.6 C)  (!) 97.5 F (36.4 C)  TempSrc:  Oral  Oral  SpO2: 98% 96% 97% 100%    Final diagnoses:  Acute cystitis without hematuria    Admission/ observation were discussed with the admitting physician, patient and/or family and they are comfortable with the plan.    Final Clinical Impressions(s) / ED Diagnoses   Final diagnoses:  Acute cystitis without hematuria    ED Discharge Orders    None       Deno Etienne, DO 08/28/19 2107

## 2019-08-28 NOTE — ED Notes (Signed)
ED TO INPATIENT HANDOFF REPORT  ED Nurse Name and Phone #: [phone] Myrtie Soman Name/Age/Gender Jorge Sherman 78 y.o. male Room/Bed: WA23/WA23  Code Status   Code Status: Partial Code  Home/SNF/Other Home Patient oriented to: self, place, time and situation Is this baseline? Yes   Triage Complete: Triage complete  Chief Complaint Hypertension; Altered Mental Status  Triage Note Pt BIBA from home.  Per EMS-  Caregiver called EMS d/t pt AMS since Monday, at which time he was dx with UTI.  Pt AOx4 at time of EMS arrival today.    BP- 70/38--> Given 250 mL NaCl- BP now 90/68.  Pt denies pain.  C/o lethargy. Pt wheelchair bound.    Allergies Allergies  Allergen Reactions  . Iohexol Other (See Comments)    A few hives post CT  . Fentanyl Nausea And Vomiting and Other (See Comments)    Patient states that it happened over 30 yrs ago  . Iodine Rash    Level of Care/Admitting Diagnosis ED Disposition    ED Disposition Condition Comment   Admit  Hospital Area: [facility] [100102]  Level of Care: Med-Surg [16]  Covid Evaluation: Confirmed COVID Negative  Diagnosis: Altered mental status [780.97.ICD-9-CM]  Admitting Physician: Orene Desanctis [id number]  Attending Physician: Orene Desanctis [id number]  Estimated length of stay: past midnight tomorrow  Certification:: I certify this patient will need inpatient services for at least 2 midnights  PT Class (Do Not Modify): Inpatient [101]  PT Acc Code (Do Not Modify): Private [1]       B Medical/Surgery History Past Medical History:  Diagnosis Date  . Chronic pain   . Depressive disorder, not elsewhere classified   . Dizziness and giddiness   . Essential and other specified forms of tremor   . Headache(784.0)   . Leukocytosis, unspecified   . Obstructive sleep apnea (adult) (pediatric)   . Pain in joint, lower leg   . Septic joint of left knee joint ([place]) 04/22/2019  . Spinal stenosis, lumbar region,  without neurogenic claudication   . Thoracic or lumbosacral neuritis or radiculitis, unspecified   . Type II or unspecified type diabetes mellitus without mention of complication, not stated as uncontrolled   . Unspecified cardiovascular disease   . Unspecified essential hypertension    Past Surgical History:  Procedure Laterality Date  . BACK SURGERY    . back surgey     times 3     A IV Location/Drains/Wounds Patient Lines/Drains/Airways Status   Active Line/Drains/Airways    Name:   Placement date:   Placement time:   Site:   Days:   Peripheral IV 08/28/19 Right Antecubital   08/28/19    --    Antecubital   less than 1          Intake/Output Last 24 hours  Intake/Output Summary (Last 24 hours) at 08/28/2019 2010 Last data filed at 08/28/2019 2005 Gross per 24 hour  Intake 1300 ml  Output --  Net 1300 ml    Labs/Imaging Results for orders placed or performed during the hospital encounter of 08/28/19 (from the past 48 hour(s))  Blood gas, venous (at [facility] and [facility], not at [facility])     Status: Abnormal   Collection Time: 08/28/19  4:06 PM  Result Value Ref Range   pH, Ven 7.231 (L) 7.250 - 7.430   pCO2, Ven 65.8 (H) 44.0 - 60.0 mmHg   pO2, Ven 38.1 32.0 - 45.0 mmHg  Bicarbonate 26.6 20.0 - 28.0 mmol/L   Acid-base deficit 1.5 0.0 - 2.0 mmol/L   O2 Saturation 61.8 %   Patient temperature 98.6     Comment: Performed at [facility], [place] [address] [place], [place] [address]  Lactic acid, plasma     Status: None   Collection Time: 08/28/19  4:09 PM  Result Value Ref Range   Lactic Acid, Venous 0.6 0.5 - 1.9 mmol/L    Comment: Performed at [facility], [place] [address], [place], [place] [address]  Comprehensive metabolic panel     Status: Abnormal   Collection Time: 08/28/19  4:09 PM  Result Value Ref Range   Sodium 127 (L) 135 - 145 mmol/L   Potassium 5.9 (H) 3.5 - 5.1 mmol/L   Chloride 94 (L) 98 - 111 mmol/L   CO2 26 22 - 32 mmol/L    Glucose, Bld 100 (H) 70 - 99 mg/dL   BUN 35 (H) 8 - 23 mg/dL   Creatinine, Ser 1.79 (H) 0.61 - 1.24 mg/dL   Calcium 8.1 (L) 8.9 - 10.3 mg/dL   Total Protein 6.5 6.5 - 8.1 g/dL   Albumin 3.7 3.5 - 5.0 g/dL   AST 14 (L) 15 - 41 U/L   ALT 14 0 - 44 U/L   Alkaline Phosphatase 64 38 - 126 U/L   Total Bilirubin 0.5 0.3 - 1.2 mg/dL   GFR calc non Af Amer 36 (L) >60 mL/min   GFR calc Af Amer 41 (L) >60 mL/min   Anion gap 7 5 - 15    Comment: Performed at [facility], [place] [address], [place], [place] [address]  CBC WITH DIFFERENTIAL     Status: Abnormal   Collection Time: 08/28/19  4:09 PM  Result Value Ref Range   WBC 13.4 (H) 4.0 - 10.5 K/uL   RBC 3.37 (L) 4.22 - 5.81 MIL/uL   Hemoglobin 9.5 (L) 13.0 - 17.0 g/dL   HCT 31.0 (L) 39.0 - 52.0 %   MCV 92.0 80.0 - 100.0 fL   MCH 28.2 26.0 - 34.0 pg   MCHC 30.6 30.0 - 36.0 g/dL   RDW 14.6 11.5 - 15.5 %   Platelets 334 150 - 400 K/uL   nRBC 0.0 0.0 - 0.2 %   Neutrophils Relative % 71 %   Neutro Abs 9.6 (H) 1.7 - 7.7 K/uL   Lymphocytes Relative 11 %   Lymphs Abs 1.5 0.7 - 4.0 K/uL   Monocytes Relative 11 %   Monocytes Absolute 1.5 (H) 0.1 - 1.0 K/uL   Eosinophils Relative 4 %   Eosinophils Absolute 0.5 0.0 - 0.5 K/uL   Basophils Relative 1 %   Basophils Absolute 0.1 0.0 - 0.1 K/uL   Immature Granulocytes 2 %   Abs Immature Granulocytes 0.23 (H) 0.00 - 0.07 K/uL    Comment: Performed at [facility], [place] [address], [place], [place] [address]  SARS Coronavirus 2 [facility] order, Performed in [facility] hospital lab) Nasopharyngeal Nasopharyngeal Swab     Status: None   Collection Time: 08/28/19  4:09 PM   Specimen: Nasopharyngeal Swab  Result Value Ref Range   SARS Coronavirus 2 NEGATIVE NEGATIVE    Comment: (NOTE) If result is NEGATIVE SARS-CoV-2 target nucleic acids are NOT DETECTED. The SARS-CoV-2 RNA is generally detectable in upper and lower  respiratory specimens during the acute phase  of infection. The lowest  concentration of SARS-CoV-2 viral copies this assay  can detect is 250  copies / mL. A negative result does not preclude SARS-CoV-2 infection  and should not be used as the sole basis for treatment or other  patient management decisions.  A negative result may occur with  improper specimen collection / handling, submission of specimen other  than nasopharyngeal swab, presence of viral mutation(s) within the  areas targeted by this assay, and inadequate number of viral copies  (<250 copies / mL). A negative result must be combined with clinical  observations, patient history, and epidemiological information. If result is POSITIVE SARS-CoV-2 target nucleic acids are DETECTED. The SARS-CoV-2 RNA is generally detectable in upper and lower  respiratory specimens dur ing the acute phase of infection.  Positive  results are indicative of active infection with SARS-CoV-2.  Clinical  correlation with patient history and other diagnostic information is  necessary to determine patient infection status.  Positive results do  not rule out bacterial infection or co-infection with other viruses. If result is PRESUMPTIVE POSTIVE SARS-CoV-2 nucleic acids MAY BE PRESENT.   A presumptive positive result was obtained on the submitted specimen  and confirmed on repeat testing.  While 2019 novel coronavirus  (SARS-CoV-2) nucleic acids may be present in the submitted sample  additional confirmatory testing may be necessary for epidemiological  and / or clinical management purposes  to differentiate between  SARS-CoV-2 and other Sarbecovirus currently known to infect humans.  If clinically indicated additional testing with an alternate test  methodology [phone]) is advised. The SARS-CoV-2 RNA is generally  detectable in upper and lower respiratory sp ecimens during the acute  phase of infection. The expected result is Negative. Fact Sheet for Patients:   StrictlyIdeas.no Fact Sheet for Healthcare Providers: BankingDealers.co.za This test is not yet approved or cleared by the [place] FDA and has been authorized for detection and/or diagnosis of SARS-CoV-2 by FDA under an Emergency Use Authorization (EUA).  This EUA will remain in effect (meaning this test can be used) for the duration of the COVID-19 declaration under Section 564(b)(1) of the Act, 21 U.S.C. section 360bbb-3(b)(1), unless the authorization is terminated or revoked sooner. Performed at [facility], [place] [address], [place], [place] [address]   Urinalysis, Routine w reflex microscopic (not at [facility])     Status: None   Collection Time: 08/28/19  4:40 PM  Result Value Ref Range   Color, Urine YELLOW YELLOW   APPearance CLEAR CLEAR   Specific Gravity, Urine 1.012 1.005 - 1.030   pH 6.0 5.0 - 8.0   Glucose, UA NEGATIVE NEGATIVE mg/dL   Hgb urine dipstick NEGATIVE NEGATIVE   Bilirubin Urine NEGATIVE NEGATIVE   Ketones, ur NEGATIVE NEGATIVE mg/dL   Protein, ur NEGATIVE NEGATIVE mg/dL   Nitrite NEGATIVE NEGATIVE   Leukocytes,Ua NEGATIVE NEGATIVE    Comment: Performed at [place] [address], [place], [place] [address]   Ct Head Wo Contrast  Result Date: 08/28/2019 CLINICAL DATA:  Altered level of consciousness. EXAM: CT HEAD WITHOUT CONTRAST TECHNIQUE: Contiguous axial images were obtained from the base of the skull through the vertex without intravenous contrast. COMPARISON:  03/14/2013. FINDINGS: Brain: No evidence of acute infarction, hemorrhage, hydrocephalus, extra-axial collection or mass lesion/mass effect. Scattered low-density changes within the periventricular and subcortical white matter compatible with chronic microvascular ischemic change. Mild diffuse cerebral volume loss. Vascular: Mild atherosclerotic calcifications involving the large vessels of the skull base. No  unexpected hyperdense vessel. Skull: Normal. Negative for fracture or focal lesion.  Sinuses/Orbits: No acute finding. Other: None. IMPRESSION: 1.  No acute intracranial findings. 2.  Chronic microvascular ischemic change and cerebral volume loss. Electronically Signed   By: Davina Poke M.D.   On: 08/28/2019 16:52   Dg Chest Port 1 View  Result Date: 08/28/2019 CLINICAL DATA:  Cough fatigue, hypotension EXAM: PORTABLE CHEST 1 VIEW COMPARISON:  08/05/2019 FINDINGS: There is no focal consolidation. There is no pleural effusion or pneumothorax. The heart and mediastinal contours are unremarkable. There is no acute osseous abnormality. IMPRESSION: No active disease. Electronically Signed   By: Kathreen Devoid   On: 08/28/2019 16:00    Pending Labs Unresulted Labs (From admission, onward)    Start     Ordered   09/04/19 0500  Creatinine, serum  (enoxaparin (LOVENOX)    CrCl >/= 30 ml/min)  Weekly,   R    Comments: while on enoxaparin therapy    08/28/19 1933   08/29/19 [id number]  Basic metabolic panel  Tomorrow morning,   R     08/28/19 1933   08/29/19 0500  CBC  Tomorrow morning,   R     08/28/19 1933   08/28/19 1931  CBC  (enoxaparin (LOVENOX)    CrCl >/= 30 ml/min)  Once,   STAT    Comments: Baseline for enoxaparin therapy IF NOT ALREADY DRAWN.  Notify MD if PLT < 100 K.    08/28/19 1933   08/28/19 1931  Creatinine, serum  (enoxaparin (LOVENOX)    CrCl >/= 30 ml/min)  Once,   STAT    Comments: Baseline for enoxaparin therapy IF NOT ALREADY DRAWN.    08/28/19 1933   08/28/19 1518  Blood Culture (routine x 2)  BLOOD CULTURE X 2,   STAT     08/28/19 1518   08/28/19 1518  Urine culture  ONCE - STAT,   STAT     08/28/19 1518          Vitals/Pain Today's Vitals   08/28/19 1503 08/28/19 1512 08/28/19 1513 08/28/19 1800  BP:  102/77  103/72  Pulse:  77  77  Resp:  19  (!) 21  Temp:  97.8 F (36.6 C)    TempSrc:  Oral    SpO2: 98% 96%  97%  PainSc:   0-No pain     Isolation  Precautions No active isolations  Medications Medications  ampicillin (OMNIPEN) 2 g in sodium chloride 0.9 % 100 mL IVPB (has no administration in time range)  enoxaparin (LOVENOX) injection 40 mg (has no administration in time range)  sodium chloride 0.9 % bolus 1,000 mL (0 mLs Intravenous Stopped 08/28/19 2005)  cefTRIAXone (ROCEPHIN) 2 g in sodium chloride 0.9 % 100 mL IVPB (0 g Intravenous Stopped 08/28/19 1909)    Mobility walks with person assist High fall risk   Focused Assessments UTI   R Recommendations: See Admitting Provider Note  Report given to:   Additional Notes:  Patient is somnolent and will fall asleep mid-conversation

## 2019-08-29 ENCOUNTER — Encounter (HOSPITAL_COMMUNITY): Payer: Self-pay | Admitting: *Deleted

## 2019-08-29 ENCOUNTER — Other Ambulatory Visit: Payer: Self-pay

## 2019-08-29 DIAGNOSIS — N3000 Acute cystitis without hematuria: Secondary | ICD-10-CM

## 2019-08-29 LAB — BASIC METABOLIC PANEL
Anion gap: 9 (ref 5–15)
BUN: 34 mg/dL — ABNORMAL HIGH (ref 8–23)
CO2: 25 mmol/L (ref 22–32)
Calcium: 8.4 mg/dL — ABNORMAL LOW (ref 8.9–10.3)
Chloride: 93 mmol/L — ABNORMAL LOW (ref 98–111)
Creatinine, Ser: 1.69 mg/dL — ABNORMAL HIGH (ref 0.61–1.24)
GFR calc Af Amer: 44 mL/min — ABNORMAL LOW (ref >60)
GFR calc non Af Amer: 38 mL/min — ABNORMAL LOW (ref >60)
Glucose, Bld: 92 mg/dL (ref 70–99)
Potassium: 5.6 mmol/L — ABNORMAL HIGH (ref 3.5–5.1)
Sodium: 127 mmol/L — ABNORMAL LOW (ref 135–145)

## 2019-08-29 LAB — RAPID URINE DRUG SCREEN, HOSP PERFORMED
Amphetamines: NOT DETECTED
Barbiturates: NOT DETECTED
Benzodiazepines: POSITIVE — AB
Cocaine: NOT DETECTED
Opiates: NOT DETECTED
Tetrahydrocannabinol: NOT DETECTED

## 2019-08-29 LAB — URINE CULTURE: Culture: NO GROWTH

## 2019-08-29 LAB — CBC
HCT: 34.8 % — ABNORMAL LOW (ref 39.0–52.0)
Hemoglobin: 10.7 g/dL — ABNORMAL LOW (ref 13.0–17.0)
MCH: 27.9 pg (ref 26.0–34.0)
MCHC: 30.7 g/dL (ref 30.0–36.0)
MCV: 90.6 fL (ref 80.0–100.0)
Platelets: 215 10*3/uL (ref 150–400)
RBC: 3.84 MIL/uL — ABNORMAL LOW (ref 4.22–5.81)
RDW: 14.6 % (ref 11.5–15.5)
WBC: 11.3 10*3/uL — ABNORMAL HIGH (ref 4.0–10.5)
nRBC: 0 % (ref 0.0–0.2)

## 2019-08-29 LAB — TSH: TSH: 1.027 u[IU]/mL (ref 0.350–4.500)

## 2019-08-29 LAB — CREATININE, URINE, RANDOM: Creatinine, Urine: 35.21 mg/dL

## 2019-08-29 LAB — ETHANOL: Alcohol, Ethyl (B): <10 mg/dL (ref <10)

## 2019-08-29 LAB — SODIUM, URINE, RANDOM: Sodium, Ur: 33 mmol/L

## 2019-08-29 LAB — OSMOLALITY, URINE: Osmolality, Ur: 247 mOsm/kg — ABNORMAL LOW (ref 300–900)

## 2019-08-29 LAB — OSMOLALITY: Osmolality: 278 mOsm/kg (ref 275–295)

## 2019-08-29 MED ORDER — GABAPENTIN 400 MG PO CAPS
800.0000 mg | ORAL_CAPSULE | Freq: Four times a day (QID) | ORAL | Status: DC
  Administered 2019-08-29: 09:00:00 800 mg via ORAL
  Filled 2019-08-29: qty 2

## 2019-08-29 MED ORDER — CLONIDINE HCL 0.1 MG PO TABS
0.1000 mg | ORAL_TABLET | Freq: Two times a day (BID) | ORAL | Status: DC
  Administered 2019-08-29 – 2019-08-30 (×3): 0.1 mg via ORAL
  Filled 2019-08-29 (×3): qty 1

## 2019-08-29 MED ORDER — TAMSULOSIN HCL 0.4 MG PO CAPS
0.4000 mg | ORAL_CAPSULE | Freq: Every day | ORAL | Status: DC
  Administered 2019-08-29 – 2019-08-30 (×2): 0.4 mg via ORAL
  Filled 2019-08-29 (×2): qty 1

## 2019-08-29 MED ORDER — SODIUM CHLORIDE 0.9 % IV SOLN
INTRAVENOUS | Status: DC
  Administered 2019-08-29: 10:00:00 via INTRAVENOUS

## 2019-08-29 MED ORDER — FUROSEMIDE 20 MG PO TABS
20.0000 mg | ORAL_TABLET | Freq: Two times a day (BID) | ORAL | Status: DC
  Administered 2019-08-29: 20 mg via ORAL
  Filled 2019-08-29: qty 1

## 2019-08-29 MED ORDER — IRBESARTAN 150 MG PO TABS: 150.0000 mg | ORAL_TABLET | Freq: Every day | ORAL | Status: DC

## 2019-08-29 MED ORDER — BUPRENORPHINE HCL-NALOXONE HCL 8-2 MG SL SUBL: 0.5000 | SUBLINGUAL_TABLET | Freq: Two times a day (BID) | SUBLINGUAL | Status: DC

## 2019-08-29 MED ORDER — MORPHINE SULFATE (PF) 2 MG/ML IV SOLN
1.0000 mg | INTRAVENOUS | Status: AC | PRN
  Administered 2019-08-29 (×2): 1 mg via INTRAVENOUS
  Filled 2019-08-29 (×2): qty 1

## 2019-08-29 MED ORDER — SODIUM CHLORIDE 0.9 % IV SOLN
2.0000 g | INTRAVENOUS | Status: DC
  Administered 2019-08-29: 2 g via INTRAVENOUS
  Filled 2019-08-29: qty 2
  Filled 2019-08-29: qty 20

## 2019-08-29 MED ORDER — AMLODIPINE BESYLATE 5 MG PO TABS
2.5000 mg | ORAL_TABLET | Freq: Every day | ORAL | Status: DC
  Administered 2019-08-29 – 2019-08-30 (×2): 2.5 mg via ORAL
  Filled 2019-08-29 (×2): qty 1

## 2019-08-29 MED ORDER — DULOXETINE HCL 30 MG PO CPEP
30.0000 mg | ORAL_CAPSULE | Freq: Three times a day (TID) | ORAL | Status: DC
  Administered 2019-08-29 – 2019-08-30 (×5): 30 mg via ORAL
  Filled 2019-08-29 (×5): qty 1

## 2019-08-29 MED ORDER — FERROUS SULFATE 325 (65 FE) MG PO TABS
325.0000 mg | ORAL_TABLET | Freq: Every day | ORAL | Status: DC
  Administered 2019-08-29 – 2019-08-30 (×2): 325 mg via ORAL
  Filled 2019-08-29 (×2): qty 1

## 2019-08-29 MED ORDER — BUPRENORPHINE HCL-NALOXONE HCL 2-0.5 MG SL SUBL
2.0000 | SUBLINGUAL_TABLET | Freq: Two times a day (BID) | SUBLINGUAL | Status: DC
  Administered 2019-08-29 – 2019-08-30 (×2): 2 via SUBLINGUAL
  Filled 2019-08-29 (×2): qty 2

## 2019-08-29 MED ORDER — ROSUVASTATIN CALCIUM 10 MG PO TABS
40.0000 mg | ORAL_TABLET | Freq: Every evening | ORAL | Status: DC
  Administered 2019-08-29: 40 mg via ORAL
  Filled 2019-08-29: qty 4

## 2019-08-29 MED ORDER — SODIUM POLYSTYRENE SULFONATE 15 GM/60ML PO SUSP
15.0000 g | Freq: Once | ORAL | Status: AC
  Administered 2019-08-29: 15 g via ORAL
  Filled 2019-08-29: qty 60

## 2019-08-29 MED ORDER — SODIUM CHLORIDE 0.9 % IV SOLN
1.0000 g | Freq: Four times a day (QID) | INTRAVENOUS | Status: DC
  Administered 2019-08-29 – 2019-08-30 (×7): 1 g via INTRAVENOUS
  Filled 2019-08-29: qty 1000
  Filled 2019-08-29: qty 1
  Filled 2019-08-29: qty 1000
  Filled 2019-08-29 (×2): qty 1
  Filled 2019-08-29: qty 1000
  Filled 2019-08-29 (×2): qty 1
  Filled 2019-08-29: qty 1000
  Filled 2019-08-29: qty 1

## 2019-08-29 MED ORDER — CELECOXIB 200 MG PO CAPS
200.0000 mg | ORAL_CAPSULE | Freq: Two times a day (BID) | ORAL | Status: DC
  Administered 2019-08-29 (×2): 200 mg via ORAL
  Filled 2019-08-29 (×4): qty 1

## 2019-08-29 MED ORDER — LINACLOTIDE 72 MCG PO CAPS
72.0000 ug | ORAL_CAPSULE | Freq: Every day | ORAL | Status: DC
  Administered 2019-08-29: 72 ug via ORAL
  Filled 2019-08-29 (×2): qty 1

## 2019-08-29 MED ORDER — GABAPENTIN 300 MG PO CAPS
600.0000 mg | ORAL_CAPSULE | Freq: Three times a day (TID) | ORAL | Status: DC
  Administered 2019-08-29 – 2019-08-30 (×4): 600 mg via ORAL
  Filled 2019-08-29 (×4): qty 2

## 2019-08-29 MED ORDER — PANTOPRAZOLE SODIUM 40 MG PO TBEC
40.0000 mg | DELAYED_RELEASE_TABLET | Freq: Every day | ORAL | Status: DC
  Administered 2019-08-29 – 2019-08-30 (×2): 40 mg via ORAL
  Filled 2019-08-29 (×2): qty 1

## 2019-08-29 NOTE — TOC Initial Note (Signed)
Transition of Care [facility]) - Initial/Assessment Note    Patient Details  Name: Jorge Sherman MRN: [id number] Date of Birth: [birth year]  Transition of Care [facility]) CM/SW Contact:    Nila Nephew, LCSW Phone Number: [phone] 08/29/2019, 11:00 AM  Clinical Narrative:     Pt using home health services of [facility] for PT/OT. Need resumption orders at DC.              Expected Discharge Plan: [place] Barriers to Discharge: Continued Medical Work up   Patient Goals and CMS Choice Patient states their goals for this hospitalization and ongoing recovery are:: did not goal set- encephalopathy      Expected Discharge Plan and Services Expected Discharge Plan: [place]   Discharge Planning Services: CM Consult                               HH Arranged: PT, OT(had services PTA)   Date HH Agency Contacted: 08/29/19 Time HH Agency Contacted: 85 Representative spoke with at [place]: Santiago Glad  Prior Living Arrangements/Services   Lives with:: Self Patient language and need for interpreter reviewed:: No              Criminal Activity/Legal Involvement Pertinent to Current Situation/Hospitalization: No - Comment as needed  Activities of Daily Living Home Assistive Devices/Equipment: Wheelchair ADL Screening (condition at time of admission) Patient's cognitive ability adequate to safely complete daily activities?: Yes Is the patient deaf or have difficulty hearing?: No Does the patient have difficulty seeing, even when wearing glasses/contacts?: No Does the patient have difficulty concentrating, remembering, or making decisions?: No Patient able to express need for assistance with ADLs?: Yes Does the patient have difficulty dressing or bathing?: Yes Independently performs ADLs?: No Communication: Independent Dressing (OT): Needs assistance Is this a change from baseline?: Pre-admission baseline Grooming: Independent Feeding:  Independent Bathing: Needs assistance Is this a change from baseline?: Pre-admission baseline Toileting: Needs assistance Is this a change from baseline?: Pre-admission baseline In/Out Bed: Needs assistance Is this a change from baseline?: Pre-admission baseline Walks in Home: Needs assistance Is this a change from baseline?: Pre-admission baseline Does the patient have difficulty walking or climbing stairs?: Yes Weakness of Legs: Both Weakness of Arms/Hands: None  Permission Sought/Granted                  Emotional Assessment              Admission diagnosis:  Hypertension; Altered Mental Status Patient Active Problem List   Diagnosis Date Noted  . Altered mental status 08/28/2019  . Septic joint of left knee joint ([place]) 04/22/2019  . Erectile dysfunction 10/30/2013  . Essential hypertension, benign 10/30/2013  . Coronary atherosclerosis of native coronary artery 10/30/2013  . Dizziness and giddiness   . Pain in joint, lower leg   . Leukocytosis, unspecified   . Type II or unspecified type diabetes mellitus without mention of complication, not stated as uncontrolled   . Obstructive sleep apnea   . Depressive disorder, not elsewhere classified   . Essential and other specified forms of tremor   . Headache(784.0)   . Spinal stenosis, lumbar region, without neurogenic claudication   . Thoracic or lumbosacral neuritis or radiculitis, unspecified   . Chronic pain    PCP:  Merrilee Seashore, MD Pharmacy:   [id number] [place], [place] [address]  [place] [address] Phone: [phone] Fax: [phone]     Social Determinants of Health (SDOH) Interventions    Readmission Risk Interventions No flowsheet data found.

## 2019-08-29 NOTE — H&P (Addendum)
History and Physical    [name] [id number] DOB: [birth year] DOA: 08/28/2019  PCP: Merrilee Seashore, MD  Patient coming from: Home but has 2 caretakers 24 hours a day  I have personally briefly reviewed patient's old medical records in [place]  Chief Complaint: Agitation, altered mental status  HPI: Jorge Sherman is a 78 y.o. male with medical history significant of type 2 diabetes, rheumatoid arthritis on immunosuppressive's, BPH with history of urinary retention, hypertension, OSA noncompliant with CPAP who presented today after caretaker found him with altered mental status.  Patient overall poor historian. He reports that he has been having UTI issues for several months and was recently started on both Bactrim and Macrobid outpatient.  He still reports continuous dysuria and increased frequency.  Also states that he is having "hallucinations."  States that he would get them "when he falls asleep and then will wake up thinking it was a dream."  States he has been more agitated as well. Patient also noticeably had hoarseness of voice during evaluation and states this has been happening for the past 6 months.  Also notes swallowing issues at the same time.  Denies any weight loss.  No fevers or night sweats.  ED Course: He was afebrile and normotensive on room air.CBC shows mild leukocytosis of 13.4 and a stable hemoglobin of 9.5.  CMP showed hyponatremia of 127 from a baseline of normal with mildly elevated potassium of 5.9.  Urinalysis shows negative leukocyte and negative nitrite.  CT head shows no acute intracranial findings.  EKG shows normal sinus rhythm with occasional PVCs.  EDP noted that patient would fall in and out of sleeping evaluations but patient was alert and oriented during my evaluation.Patient was started on ampicillin and Rocephin in the ED per pharmacy recommendation.  Review of Systems:  Constitutional: No Weight Change, No Fever ENT/Mouth: No sore  throat, No Rhinorrhea Eyes: No Eye Pain, No Vision Changes Cardiovascular: No Chest Pain, no SOB Respiratory: No Cough, No Sputum, No Wheezing, no Dyspnea  Gastrointestinal: No Nausea, No Vomiting, No Diarrhea, No Constipation, No Pain Genitourinary: no Urinary Incontinence, No Urgency, No Flank Pain Musculoskeletal: No Arthralgias, No Myalgias Skin: No Skin Lesions, No Pruritus, Neuro: no Weakness, No Numbness,  No Loss of Consciousness, No Syncope Psych: No Anxiety/Panic, No Depression, no decrease appetite Heme/Lymph: No Bruising, No Bleeding   Past Medical History:  Diagnosis Date  . Chronic pain   . Depressive disorder, not elsewhere classified   . Dizziness and giddiness   . Essential and other specified forms of tremor   . Headache(784.0)   . Leukocytosis, unspecified   . Obstructive sleep apnea (adult) (pediatric)   . Pain in joint, lower leg   . Septic joint of left knee joint ([place]) 04/22/2019  . Spinal stenosis, lumbar region, without neurogenic claudication   . Thoracic or lumbosacral neuritis or radiculitis, unspecified   . Type II or unspecified type diabetes mellitus without mention of complication, not stated as uncontrolled   . Unspecified cardiovascular disease   . Unspecified essential hypertension     Past Surgical History:  Procedure Laterality Date  . BACK SURGERY    . back surgey     times 3     reports that he has quit smoking. He has never used smokeless tobacco. He reports that he does not drink alcohol or use drugs.  Allergies  Allergen Reactions  . Iohexol Other (See Comments)    A few hives post CT  .  Fentanyl Nausea And Vomiting, Other (See Comments) and Itching    Patient states that it happened over 30 yrs ago Patient states that it happened over 30 yrs ago  . Iodine Rash    Family History  Problem Relation Age of Onset  . Cancer Father   . Heart disease Father   . Stroke Mother   . Diabetes Mother    Family history reviewed and  not pertinent   Prior to Admission medications   Medication Sig Start Date End Date Taking? Authorizing Provider  amLODipine (NORVASC) 2.5 MG tablet Take 2.5 mg by mouth daily.   Yes [provider]  buprenorphine-naloxone (SUBOXONE) 8-2 mg SUBL SL tablet Place 0.5 tablets under the tongue 2 (two) times daily.   Yes [provider]  celecoxib (CELEBREX) 200 MG capsule Take 200 mg by mouth 2 (two) times daily.   Yes [provider]  cloNIDine (CATAPRES) 0.1 MG tablet Take 0.1 mg by mouth 2 (two) times daily.    Yes [provider]  dicyclomine (BENTYL) 10 MG capsule Take 10 mg by mouth 4 (four) times daily.   Yes [provider]  DULoxetine (CYMBALTA) 30 MG capsule Take 30 mg by mouth 3 (three) times daily.    Yes [provider]  ferrous sulfate 325 (65 FE) MG tablet Take 325 mg by mouth daily.    Yes [provider]  furosemide (LASIX) 20 MG tablet Take 20 mg by mouth 2 (two) times daily.   Yes [provider]  gabapentin (NEURONTIN) 800 MG tablet Take 800 mg by mouth 4 (four) times daily.    Yes [provider]  hydroxychloroquine (PLAQUENIL) 200 MG tablet Take 200 mg by mouth 2 (two) times daily.   Yes [provider]  Iron-FA-B Cmp-C-Biot-Probiotic (FUSION PLUS PO) Take 1 capsule by mouth daily.   Yes [provider]  linaclotide (LINZESS) 72 MCG capsule Take 72 mcg by mouth daily before breakfast.   Yes [provider]  LORazepam (ATIVAN) 1 MG tablet Take 2 mg by mouth every 6 (six) hours as needed for anxiety.    Yes [provider]  methocarbamol (ROBAXIN) 500 MG tablet Take 500 mg by mouth 3 (three) times daily as needed for muscle spasms.   Yes [provider]  nitrofurantoin (MACRODANTIN) 100 MG capsule Take 100 mg by mouth at bedtime.   Yes [provider]  olmesartan (BENICAR) 20 MG tablet Take 20 mg by mouth daily.    Yes [provider]   omeprazole (PRILOSEC) 40 MG capsule Take 40 mg by mouth daily.   Yes [provider]  rosuvastatin (CRESTOR) 40 MG tablet Take 40 mg by mouth every evening.    Yes [provider]  sulfamethoxazole-trimethoprim (BACTRIM DS) 800-160 MG tablet Take 1 tablet by mouth 2 (two) times daily.   Yes [provider]  tamsulosin (FLOMAX) 0.4 MG CAPS capsule Take 0.4 mg by mouth daily.   Yes [provider]    Physical Exam: Vitals:   08/28/19 1503 08/28/19 1512 08/28/19 1800 08/28/19 2101  BP:  102/77 103/72 (!) 131/98  Pulse:  77 77 81  Resp:  19 (!) 21 20  Temp:  97.8 F (36.6 C)  (!) 97.5 F (36.4 C)  TempSrc:  Oral  Oral  SpO2: 98% 96% 97% 100%    Constitutional: NAD, calm, comfortable obese male laying flat in bed Vitals:   08/28/19 1503 08/28/19 1512 08/28/19 1800 08/28/19 2101  BP:  102/77 103/72 (!) 131/98  Pulse:  77 77 81  Resp:  19 (!) 21 20  Temp:  97.8 F (36.6 C)  (!) 97.5 F (36.4 C)  TempSrc:  Oral  Oral  SpO2: 98% 96% 97% 100%   Eyes: PERRL, lids and conjunctivae normal ENMT: Mucous membranes are moist. Posterior pharynx clear of any exudate or lesions.Cobblestoning on posterior tongue. Normal dentition. Horseness of voice.   Neck: normal, supple, no masses, no thyromegaly Respiratory: clear to auscultation bilaterally, no wheezing, no crackles. Normal respiratory effort. No accessory muscle use.  Cardiovascular: Regular rate and rhythm, no murmurs / rubs / gallops. No extremity edema. 2+ pedal pulses. No carotid bruits.  Abdomen: no tenderness, no masses palpated. No hepatosplenomegaly. Bowel sounds positive.  Musculoskeletal: no clubbing / cyanosis. No joint deformity upper and lower extremities. Good ROM, no contractures. Normal muscle tone.  Skin: no rashes, lesions, ulcers. No induration Neurologic: CN 2-12 grossly intact. Sensation intact, DTR normal. Strength 5/5 in all 4.  Psychiatric: Normal judgment and insight. Alert and  oriented x 3. Normal mood.     Labs on Admission: I have personally reviewed following labs and imaging studies  CBC: Recent Labs  Lab 08/28/19 1609 08/28/19 2151  WBC 13.4* 12.3*  NEUTROABS 9.6*  --   HGB 9.5* 9.7*  HCT 31.0* 31.8*  MCV 92.0 91.1  PLT 334 123456   Basic Metabolic Panel: Recent Labs  Lab 08/28/19 1609 08/28/19 2151  NA 127*  --   K 5.9*  --   CL 94*  --   CO2 26  --   GLUCOSE 100*  --   BUN 35*  --   CREATININE 1.79* 1.93*  CALCIUM 8.1*  --    GFR: CrCl cannot be calculated (Unknown ideal weight.). Liver Function Tests: Recent Labs  Lab 08/28/19 1609  AST 14*  ALT 14  ALKPHOS 64  BILITOT 0.5  PROT 6.5  ALBUMIN 3.7   No results for input(s): LIPASE, AMYLASE in the last 168 hours. No results for input(s): AMMONIA in the last 168 hours. Coagulation Profile: No results for input(s): INR, PROTIME in the last 168 hours. Cardiac Enzymes: No results for input(s): CKTOTAL, CKMB, CKMBINDEX, TROPONINI in the last 168 hours. BNP (last 3 results) No results for input(s): PROBNP in the last 8760 hours. HbA1C: No results for input(s): HGBA1C in the last 72 hours. CBG: No results for input(s): GLUCAP in the last 168 hours. Lipid Profile: No results for input(s): CHOL, HDL, LDLCALC, TRIG, CHOLHDL, LDLDIRECT in the last 72 hours. Thyroid Function Tests: No results for input(s): TSH, T4TOTAL, FREET4, T3FREE, THYROIDAB in the last 72 hours. Anemia Panel: No results for input(s): VITAMINB12, FOLATE, FERRITIN, TIBC, IRON, RETICCTPCT in the last 72 hours. Urine analysis:    Component Value Date/Time   COLORURINE YELLOW 08/28/2019 1640   APPEARANCEUR CLEAR 08/28/2019 1640   LABSPEC 1.012 08/28/2019 1640   PHURINE 6.0 08/28/2019 1640   GLUCOSEU NEGATIVE 08/28/2019 1640   HGBUR NEGATIVE 08/28/2019 1640   BILIRUBINUR NEGATIVE 08/28/2019 1640   KETONESUR NEGATIVE 08/28/2019 1640   PROTEINUR NEGATIVE 08/28/2019 1640   NITRITE NEGATIVE 08/28/2019 1640    LEUKOCYTESUR NEGATIVE 08/28/2019 1640    Radiological Exams on Admission: Ct Head Wo Contrast  Result Date: 08/28/2019 CLINICAL DATA:  Altered level of consciousness. EXAM: CT HEAD WITHOUT CONTRAST TECHNIQUE: Contiguous axial images were obtained from the base of the skull through the vertex without intravenous contrast. COMPARISON:  03/14/2013. FINDINGS: Brain: No evidence of acute  infarction, hemorrhage, hydrocephalus, extra-axial collection or mass lesion/mass effect. Scattered low-density changes within the periventricular and subcortical white matter compatible with chronic microvascular ischemic change. Mild diffuse cerebral volume loss. Vascular: Mild atherosclerotic calcifications involving the large vessels of the skull base. No unexpected hyperdense vessel. Skull: Normal. Negative for fracture or focal lesion. Sinuses/Orbits: No acute finding. Other: None. IMPRESSION: 1.  No acute intracranial findings. 2.  Chronic microvascular ischemic change and cerebral volume loss. Electronically Signed   By: Davina Poke M.D.   On: 08/28/2019 16:52   Dg Chest Port 1 View  Result Date: 08/28/2019 CLINICAL DATA:  Cough fatigue, hypotension EXAM: PORTABLE CHEST 1 VIEW COMPARISON:  08/05/2019 FINDINGS: There is no focal consolidation. There is no pleural effusion or pneumothorax. The heart and mediastinal contours are unremarkable. There is no acute osseous abnormality. IMPRESSION: No active disease. Electronically Signed   By: Kathreen Devoid   On: 08/28/2019 16:00    EKG: Independently reviewed.  Assessment/Plan   Altered mental status -Suspect could be due to polypharmacy and possible persistent UTI.  Presented with both Macrobid and Bactrim for UTI treatment outpatient.  CT head negative. -Has had multiple recurrent UTI. Last culture in Sept showed vancomycin resistant enterococcus that was sensitive to ampicillin, linezolid and nitrofurantoin. -Urinalysis was negative but could be due to  patient has been on antibiotics.  Will continue IV Rocephin and ampicillin per pharmacy until return of urine culture results. - will check UDS and alcohol level - check TSH   Hyponatremia -unclear source. Likely from hypovolemia - will check serum osmole, urine osmole and sodium  Hyperkalemia -Potassium of 5.9 on admission.  Will give Kayexalate.  Voice Hoarseness - pt reports change in voice for past 6 months along with swallowing difficulties. - evaluate with SLP.  - no thyromegaly or neck masses on exam. He had cobblestoning of the posterior pharynx possibly from recent post-nasal drainage vs GERD.  - recommend outpatient follow with GI and possibly ENT as well.   Chronic pain syndrome - pt appears to have recently started Suboxone on 10/6. Possibly could be cause of AMS- will hold for now.  -continue Celebrex and gabapentin  Hypertension -continue amlipidine, clonidine, irbesartan and lasix    Chronic anemia -Patient followed by hematology and last saw them on 10/5 for work-up of anemia.  Will need to continue follow-up outpatient.       Orene Desanctis DO [facility]   If 7PM-7AM, please contact night-coverage [email or web address] Password [email or web address]  08/29/2019, 1:10 AM

## 2019-08-29 NOTE — Progress Notes (Addendum)
Hospitalist progress note  Jorge Sherman  [id number] DOB: Oct 06, 1941 DOA: 08/28/2019 PCP: Merrilee Seashore, MD  Narrative:  11 WM CAD BMS LAD 2009 (BB intolerance), HTN HLD depression osteoarthritis rheumatoid BPH OSA no CPAP DM TY 2 recent ED visit + fall 08/05/2019-Rx Keflex for?  UTI at that time-sent in PCP office 10/8 fatigue, hypotensive-?  Meds changed to Bactrim + Macrobid-CT head negative for pathology-started on IV saline mild hyperkalemia Rocephin ampicillin in ED  Assessment & Plan:   Toxic metabolic encephalopathy?  Infectious metabolic polypharmacy-note also on Suboxone fentanyl gabapentin Ativan Robaxin all of which can cause some confusion especially in combination in the beers criteria meds--this is improved--should attempt to cut back meds--changing gabapentin to 600 tid  Hyponatremia hyperkalemia patient on Lasix in addition to Avapro-- held-obtain urinary creatinine urinary urea to calculate Fe Urea to determine if this is obstructive--urine osmolality is low sodium is also low somewhat low so will start on saline 50 cc/h  Urinary tract infection prior vancomycin resistant Klebsiella pneumonia in the past-continue ampicillin and ceftriaxone at this time follow blood and urine cultures drawn 10/7  Hoarse?  Dysphagia will need SLP to see can keep on clear liquid diet at this time  Chronic pain syndrome on Suboxone-holding at this time-also 800 mg qid-->600 tid  DM TY 2 reported history of but no actual meds at this time-monitor  Anemia- continue ferrous sulfate 325 daily  CAD BMS 2009 on amlodipine 2.5, clonidine 0.1  Depression continue Cymbalta 30 3 times daily  BPH/LUTS  continuing Flomax 0.4  OSA no CPAP CO2 and on admission was not concerning for retention  Rheumatoid arthritis  Plaquenil 200 twice daily will resume on discharge  DVT prophylaxis: lovenoex  Code Status:   dnr confirmed  Family Communication:   Called son Jovannie [phone] answer, updated  daughter on phone [phone] 08/29/19 Disposition Plan: Await resolution of acute illness not ready for discharge on IV antibiotics sodium still deranged  Consultants:   none  Procedures:   n  Antimicr ampicillin and ceftriaxone since admission obials:      Subjective: Awake coherent to some degree but does occasionally seem tangential and loses train of thought closes eyes occasionally during discussion no focal deficit able to verbalize apparently better than he has been given history No chest pain no vomiting at present time no fever no chills does have some burning in the urine at times  Objective: Vitals:   08/28/19 1800 08/28/19 2101 08/29/19 0500 08/29/19 0511  BP: 103/72 (!) 131/98  128/70  Pulse: 77 81  89  Resp: (!) 21 20  19   Temp:  (!) 97.5 F (36.4 C)  98.8 F (37.1 C)  TempSrc:  Oral  Oral  SpO2: 97% 100%  100%  Weight:   126 kg   Height:   5\' 10"  (1.778 m)     Intake/Output Summary (Last 24 hours) at 08/29/2019 [id number] Last data filed at 08/29/2019 0815 Gross per 24 hour  Intake 1523.33 ml  Output 200 ml  Net 1323.33 ml   Filed Weights   08/29/19 0500  Weight: 126 kg   Examination: EOMI NCAT no focal deficit Moving all 4 limbs equally with bruises and scars over lower extremities and areas of small hematomas over upper extremities Chest clinically clear no added sound neck is soft supple thick Neurologically intact 5/5 Psych euthymic but slightly tangential occasionally seems a little forgetful although can name his daughter and son    Data Reviewed: I have  personally reviewed following labs and imaging studies  CBC: Recent Labs  Lab 08/28/19 1609 08/28/19 2151 08/29/19 0146  WBC 13.4* 12.3* 11.3*  NEUTROABS 9.6*  --   --   HGB 9.5* 9.7* 10.7*  HCT 31.0* 31.8* 34.8*  MCV 92.0 91.1 90.6  PLT 334 305 123456   Basic Metabolic Panel: Recent Labs  Lab 08/28/19 1609 08/28/19 2151 08/29/19 0146  NA 127*  --  127*  K 5.9*  --  5.6*  CL 94*   --  93*  CO2 26  --  25  GLUCOSE 100*  --  92  BUN 35*  --  34*  CREATININE 1.79* 1.93* 1.69*  CALCIUM 8.1*  --  8.4*   GFR: Estimated Creatinine Clearance: 48 mL/min (A) (by C-G formula based on SCr of 1.69 mg/dL (H)). Liver Function Tests: Recent Labs  Lab 08/28/19 1609  AST 14*  ALT 14  ALKPHOS 64  BILITOT 0.5  PROT 6.5  ALBUMIN 3.7   No results for input(s): LIPASE, AMYLASE in the last 168 hours. No results for input(s): AMMONIA in the last 168 hours. Coagulation Profile: No results for input(s): INR, PROTIME in the last 168 hours. Cardiac Enzymes:  Radiology Studies: Reviewed images personally in health database   Scheduled Meds: . amLODipine  2.5 mg Oral Daily  . celecoxib  200 mg Oral BID  . cloNIDine  0.1 mg Oral BID  . DULoxetine  30 mg Oral TID  . enoxaparin (LOVENOX) injection  40 mg Subcutaneous QHS  . ferrous sulfate  325 mg Oral Daily  . furosemide  20 mg Oral BID  . gabapentin  800 mg Oral QID  . irbesartan  150 mg Oral Daily  . linaclotide  72 mcg Oral QAC breakfast  . pantoprazole  40 mg Oral Daily  . rosuvastatin  40 mg Oral QPM  . tamsulosin  0.4 mg Oral Daily   Continuous Infusions: . ampicillin (OMNIPEN) IV 1 g (08/29/19 0513)  . ampicillin (OMNIPEN) IV    . cefTRIAXone (ROCEPHIN)  IV       LOS: 1 day    Time spent: [place], MD [facility]  If 7PM-7AM, please contact night-coverage-look on AMION to find my number otherwise-prefer pages-not epic chat,please 08/29/2019, 8:22 AM

## 2019-08-29 NOTE — Progress Notes (Addendum)
BSE completed, full report to follow.  Pt c/o dysphonia, dysphagia to solids more than liquids for several years (caregiver reports some difficulty with foods).  SLP questions if pt may have laryngeal involvement of RA impacting cricoartyenoid and cricothyroid.   Pt states this has not improved or worsened. He points to cervical esophagus to indicate area of sensing lodging but denies requiring expectoration - suspect possible referent sensation from distal esophagus to pharynx.  Recommend consider dedicated esophogeal evaluation or GI referral to address pt's dysphagia.  Question if chronic asymptomatic reflux also may be contributing to dysphonia.  Advised pt to precautions using written and verbal instructions with teach back.  Luanna Salk, [facility] SLP Acute Rehab Services Pager [phone] Office [phone]

## 2019-08-29 NOTE — Evaluation (Signed)
Clinical/Bedside Swallow Evaluation Patient Details  Name: Jorge Sherman MRN: [id number] Date of Birth: 09-Mar-1941  Today's Date: 08/29/2019 Time: SLP Start Time (ACUTE ONLY): [id number] SLP Stop Time (ACUTE ONLY): [id number] SLP Time Calculation (min) (ACUTE ONLY): 20 min  Past Medical History:  Past Medical History:  Diagnosis Date  . Chronic pain   . Depressive disorder, not elsewhere classified   . Dizziness and giddiness   . Essential and other specified forms of tremor   . Headache(784.0)   . Leukocytosis, unspecified   . Obstructive sleep apnea (adult) (pediatric)   . Pain in joint, lower leg   . Septic joint of left knee joint ([place]) 04/22/2019  . Spinal stenosis, lumbar region, without neurogenic claudication   . Thoracic or lumbosacral neuritis or radiculitis, unspecified   . Type II or unspecified type diabetes mellitus without mention of complication, not stated as uncontrolled   . Unspecified cardiovascular disease   . Unspecified essential hypertension    Past Surgical History:  Past Surgical History:  Procedure Laterality Date  . BACK SURGERY    . back surgey     times 3   HPI:  78 yo male admitted to [facility] with AMS, found to have UTI and be hypotensive due to acute cystitis.  Pt with PMH + for chronic pain, headaches, dementia, DM2, sleep apnea - CPAP, RA.  He was hallucinating.  Had a hoarse voice for years per RN that has him today.   Assessment / Plan / Recommendation Clinical Impression  Patient with negative Cranial nerve exam and no indication of aspiration/airway compromise during po intake/clinical evaluation.  No indication of significant oropharyngeal dysphagia noted - frequent belching concerning for possible refluxing/esophageal deficits.  Pt is on a PPI but denies reflux.  He does report sensation of food lodging in throat requiring liquids to clear.  Endoscopy done 03/2012 showed small hiatal hernia and gastritis.   He does have significant hoarseness and reports  this to be sudden onset without worsening or improvement.  Pt reports he saw an ENT for his voice difficulties approximately five years ago but states everyone says he is "ok".  SLP questions if pt may have cricoarytenoid or cricothyroid involvment of RA impacting his voice????    As pt reports more problems swallowing foods than liquids (and caregiver agrees via phone in pt's room), question if he has some esophageal deficits.    Recommend to consider a dedicated esophageal evaluation or a GI referral.  Pt may benefit from OP ENT referral given it has been 5 years especially given his h/o smoking.    Advised pt to general aspiration/esophageal precautions. Will follow up for dysphagia management.      Aspiration Risk    mild   Diet Recommendation Dysphagia 3 (Mech soft);Thin liquid(d3 due to edentulous and solid food dysphagia)   Liquid Administration via: Cup;Straw Medication Administration: (as tolerated but with applesauce if indicated/helpful) Compensations: Slow rate;Small sips/bites;Lingual sweep for clearance of pocketing Postural Changes: Seated upright at 90 degrees;Remain upright for at least 30 minutes after po intake    Other  Recommendations Recommended Consults: Consider esophageal assessment Oral Care Recommendations: Oral care BID   Follow up Recommendations None      Frequency and Duration min 1 x/week  2 weeks       Prognosis   ?     Swallow Study   General Date of Onset: 08/29/19 HPI: 78 yo male admitted to [facility] with AMS, found to have UTI and be  hypotensive due to acute cystitis.  Pt with PMH + for chronic pain, headaches, dementia, DM2, sleep apnea - CPAP, RA.  He was hallucinating.  Had a hoarse voice for years per RN that has him today. Type of Study: Bedside Swallow Evaluation Diet Prior to this Study: Dysphagia 3 (soft);Thin liquids Temperature Spikes Noted: No Respiratory Status: Nasal cannula History of Recent Intubation: No Behavior/Cognition:  Alert;Cooperative;Pleasant mood Oral Cavity Assessment: Within Functional Limits Oral Care Completed by SLP: No Oral Cavity - Dentition: Edentulous Vision: Functional for self-feeding Self-Feeding Abilities: Able to feed self Patient Positioning: Upright in bed Baseline Vocal Quality: Hoarse;Suspected CN X (Vagus) involvement Volitional Cough: Weak Volitional Swallow: Able to elicit    Oral/Motor/Sensory Function Overall Oral Motor/Sensory Function: Within functional limits   Ice Chips Ice chips: Not tested   Thin Liquid Thin Liquid: Within functional limits Presentation: Self Fed;Cup Other Comments: pt did not swallow 3 ounces of water sequentially as he required rest break, however he did not demonstrate any indications of aspiration or airway compromise with intake    Nectar Thick Nectar Thick Liquid: Not tested   Honey Thick Honey Thick Liquid: Not tested   Puree Puree: Within functional limits Presentation: Self Fed;Spoon   Solid     Solid: Within functional limits Presentation: Self Fed;Spoon     Luanna Salk, MS [facility] SLP Acute Rehab Services Pager [phone] Office [phone]  Macario Golds 08/29/2019,2:52 PM   Luanna Salk, [facility] SLP [place] Pager [phone] Office [phone]

## 2019-08-30 LAB — COMPREHENSIVE METABOLIC PANEL
ALT: 12 U/L (ref 0–44)
AST: 13 U/L — ABNORMAL LOW (ref 15–41)
Albumin: 3.6 g/dL (ref 3.5–5.0)
Alkaline Phosphatase: 68 U/L (ref 38–126)
Anion gap: 8 (ref 5–15)
BUN: 16 mg/dL (ref 8–23)
CO2: 26 mmol/L (ref 22–32)
Calcium: 8.9 mg/dL (ref 8.9–10.3)
Chloride: 100 mmol/L (ref 98–111)
Creatinine, Ser: 0.82 mg/dL (ref 0.61–1.24)
GFR calc Af Amer: >60 mL/min (ref >60)
GFR calc non Af Amer: >60 mL/min (ref >60)
Glucose, Bld: 120 mg/dL — ABNORMAL HIGH (ref 70–99)
Potassium: 4.8 mmol/L (ref 3.5–5.1)
Sodium: 134 mmol/L — ABNORMAL LOW (ref 135–145)
Total Bilirubin: 0.2 mg/dL — ABNORMAL LOW (ref 0.3–1.2)
Total Protein: 6.6 g/dL (ref 6.5–8.1)

## 2019-08-30 LAB — UREA NITROGEN, URINE: Urea Nitrogen, Ur: 300 mg/dL

## 2019-08-30 MED ORDER — GABAPENTIN 300 MG PO CAPS: 600.0000 mg | ORAL_CAPSULE | Freq: Three times a day (TID) | ORAL | 0 refills | Status: DC

## 2019-08-30 MED ORDER — LORAZEPAM 1 MG PO TABS
1.0000 mg | ORAL_TABLET | Freq: Four times a day (QID) | ORAL | Status: DC | PRN
  Administered 2019-08-30: 1 mg via ORAL
  Filled 2019-08-30: qty 2

## 2019-08-30 NOTE — Plan of Care (Signed)
Pt ready for DC home 

## 2019-08-30 NOTE — TOC Transition Note (Signed)
Transition of Care [facility]) - CM/SW Discharge Note   Patient Details  Name: Jorge Sherman MRN: [id number] Date of Birth: 31-May-1941  [facility]) CM/SW Contact:  Nila Nephew, LCSW Phone Number: [phone] 08/30/2019, 1:58 PM   Clinical Narrative:   Resumption orders for home health (PT OT, with [facility]) in. (Pt states he has been using private caregiving aide but may switch due to difficulty communicating, states he is going to call around to companies to get pricing and schedules. )    Final next level of care: Home w Home Health Services Barriers to Discharge: No Barriers Identified   Patient Goals and CMS Choice Patient states their goals for this hospitalization and ongoing recovery are:: go home      Discharge Placement                       Discharge Plan and Services   Discharge Planning Services: CM Consult                      HH Arranged: PT, OT(had services PTA)   Date HH Agency Contacted: 08/29/19 Time HH Agency Contacted: 1100 Representative spoke with at [place]: [place] Determinants of Health (SDOH) Interventions     Readmission Risk Interventions No flowsheet data found.

## 2019-08-30 NOTE — Discharge Summary (Signed)
Physician Discharge Summary  Jorge Sherman [id number] DOB: 11-04-1941 DOA: 08/28/2019  PCP: Merrilee Seashore, MD  Admit date: 08/28/2019 Discharge date: 08/30/2019  Time spent: 45 minutes  Recommendations for Outpatient Follow-up:  1. Recommend revisit discussions with urology regarding bacteriuria suppression-have discontinued Macrodantin Bactrim this admission as urine culture negative but needs discussion regarding the same 2. Needs CBC differential and Chem-12 1 week 3. Please try to de-escalate down off of his other chronic pain meds with the help of pain specialist he is on multiple beers criteria medications-have cut back his gabapentin, stopped Lasix stopped Benicar this admission 4. Suggest outpatient ENT/GI referral for esophageal dysmotility   Discharge Diagnoses:  Active Problems:   Altered mental status   Discharge Condition: Guarded high risk for readmission  Diet recommendation: Heart healthy  Filed Weights   08/29/19 0500  Weight: 126 kg    History of present illness:  23 WM CAD BMS LAD 2009 (BB intolerance), HTN HLD depression osteoarthritis rheumatoid BPH OSA no CPAP DM TY 2 recent ED visit + fall 08/05/2019-Rx Keflex for?  UTI at that time-sent in PCP office 10/8 fatigue, hypotensive-?  Meds changed to Bactrim + Macrobid-CT head negative for pathology-started on IV saline mild hyperkalemia Rocephin ampicillin in ED   Hospital Course:  Toxic metabolic encephalopathy?  Infectious metabolic polypharmacy-note also on Suboxone fentanyl gabapentin Ativan Robaxin all of which can cause some confusion especially in combination in the beers criteria meds--this is improved--should attempt to cut back meds--changing gabapentin to 600 tid-suggest PCP and pain physician coordinate as an outpatient the same  Hyponatremia hyperkalemia patient on Lasix in addition to Avapro--FE sodium was never done, osmolality is low-given saline 50 cc/h and started to improve and on  discharge sodium 134-hyperkalemia was probably because use of Bactrim in the setting of ARB and Lasix and this resolved also and he should probably not use an ARB or Lasix in the future  Urinary tract infection prior vancomycin resistant Klebsiella pneumonia in the past-blood urine cultures showed no growth ampicillin ceftriaxone started on admission were discontinued on 10/9 Suppressive therapy should be discontinued and discussed further with urologist as he has had "17 further urinary infections in the outpatient setting"  Hoarse?  Dysphagia will need SLP to see can keep on clear liquid diet at this time-suggest outpatient GI or ENT follow-up regarding the same  Chronic pain syndrome on Suboxone--resumed in the p.m. 10/9, gabapentin 800 mg qid-->600 tid resume lower dose Ativan 1 mg every 6 as needed as opposed to 2 mg  DM TY 2 reported history of but no actual meds at this time-monitor  Anemia- continue ferrous sulfate 325 daily  CAD BMS 2009 on amlodipine 2.5, clonidine 0.1  Depression continue Cymbalta 30 3 times daily  BPH/LUTS  continuing Flomax 0.4  OSA no CPAP CO2 and on admission was not concerning for retention  Rheumatoid arthritis  Plaquenil 200 twice daily will resume on discharge  Procedures:  n   Consultations:  n  Discharge Exam: Vitals:   08/29/19 2045 08/30/19 0446  BP: 126/64 119/64  Pulse: 95 87  Resp: 16 18  Temp: 98.2 F (36.8 C) 98.1 F (36.7 C)  SpO2: 100% 98%    General: Awake coherent anxious and seems to be having pressured speech Cardiovascular: S1-S2 no murmur rub or gallop Respiratory: Clear no added sound no rales no rhonchi No lower extremity edema Neurologically intact no focal deficit Multiple bruises to lower extremities Skin is frail on upper extremities  Discharge  Instructions   Discharge Instructions    Diet - low sodium heart healthy   Complete by: As directed    Discharge instructions   Complete by: As  directed    Renewed certain medications such as Benicar and Bactrim can cause your potassium to go up-additionally you were on Macrodantin to suppress urinary infections and that can sometimes cause kidney issues so we have held that Your gabapentin should be taken at a slightly lower dose and I have stopped your Bentyl completely in addition to your Robaxin and you can talk to your pain physician about resuming these-these were stopped because there was a risk of you being oversedated with these medications Please ensure that you get labs within 1 week   Increase activity slowly   Complete by: As directed      Allergies as of 08/30/2019      Reactions   Iohexol Other (See Comments)   A few hives post CT   Fentanyl Nausea And Vomiting, Other (See Comments), Itching   Patient states that it happened over 30 yrs ago Patient states that it happened over 30 yrs ago   Iodine Rash      Medication List    STOP taking these medications   dicyclomine 10 MG capsule Commonly known as: BENTYL   furosemide 20 MG tablet Commonly known as: LASIX   gabapentin 800 MG tablet Commonly known as: NEURONTIN Replaced by: gabapentin 300 MG capsule   methocarbamol 500 MG tablet Commonly known as: ROBAXIN   nitrofurantoin 100 MG capsule Commonly known as: MACRODANTIN   olmesartan 20 MG tablet Commonly known as: BENICAR   sulfamethoxazole-trimethoprim 800-160 MG tablet Commonly known as: BACTRIM DS     TAKE these medications   amLODipine 2.5 MG tablet Commonly known as: NORVASC Take 2.5 mg by mouth daily.   buprenorphine-naloxone 8-2 mg Subl SL tablet Commonly known as: SUBOXONE Place 0.5 tablets under the tongue 2 (two) times daily.   celecoxib 200 MG capsule Commonly known as: CELEBREX Take 200 mg by mouth 2 (two) times daily.   cloNIDine 0.1 MG tablet Commonly known as: CATAPRES Take 0.1 mg by mouth 2 (two) times daily.   DULoxetine 30 MG capsule Commonly known as:  CYMBALTA Take 30 mg by mouth 3 (three) times daily.   ferrous sulfate 325 (65 FE) MG tablet Take 325 mg by mouth daily.   FUSION PLUS PO Take 1 capsule by mouth daily.   gabapentin 300 MG capsule Commonly known as: NEURONTIN Take 2 capsules (600 mg total) by mouth 3 (three) times daily. Replaces: gabapentin 800 MG tablet   hydroxychloroquine 200 MG tablet Commonly known as: PLAQUENIL Take 200 mg by mouth 2 (two) times daily.   Linzess 72 MCG capsule Generic drug: linaclotide Take 72 mcg by mouth daily before breakfast.   LORazepam 1 MG tablet Commonly known as: ATIVAN Take 2 mg by mouth every 6 (six) hours as needed for anxiety.   omeprazole 40 MG capsule Commonly known as: PRILOSEC Take 40 mg by mouth daily.   rosuvastatin 40 MG tablet Commonly known as: CRESTOR Take 40 mg by mouth every evening.   tamsulosin 0.4 MG Caps capsule Commonly known as: FLOMAX Take 0.4 mg by mouth daily.      Allergies  Allergen Reactions  . Iohexol Other (See Comments)    A few hives post CT  . Fentanyl Nausea And Vomiting, Other (See Comments) and Itching    Patient states that it happened over 30 yrs  ago Patient states that it happened over 30 yrs ago  . Iodine Rash      The results of significant diagnostics from this hospitalization (including imaging, microbiology, ancillary and laboratory) are listed below for reference.    Significant Diagnostic Studies: Ct Abdomen Pelvis Wo Contrast  Result Date: 08/05/2019 CLINICAL DATA:  Back pain after fall. EXAM: CT ABDOMEN AND PELVIS WITHOUT CONTRAST TECHNIQUE: Multidetector CT imaging of the abdomen and pelvis was performed following the standard protocol without IV contrast. COMPARISON:  CT scan of October 30, 2009. FINDINGS: Lower chest: No acute abnormality. Hepatobiliary: No focal liver abnormality is seen. No gallstones, gallbladder wall thickening, or biliary dilatation. Pancreas: Unremarkable. No pancreatic ductal dilatation  or surrounding inflammatory changes. Spleen: Normal in size without focal abnormality. Adrenals/Urinary Tract: Adrenal glands are unremarkable. Kidneys are normal, without renal calculi, focal lesion, or hydronephrosis. Bladder is unremarkable. Stomach/Bowel: The stomach appears normal. There is no evidence of bowel obstruction or inflammation. The appendix is not visualized. Diverticulosis of descending colon is noted without inflammation. Vascular/Lymphatic: Aortic atherosclerosis. No enlarged abdominal or pelvic lymph nodes. Reproductive: Prostate is unremarkable. Other: No abdominal wall hernia or abnormality. No abdominopelvic ascites. Musculoskeletal: No acute or significant osseous findings. IMPRESSION: No acute abnormality seen in the abdomen or pelvis. Aortic Atherosclerosis (ICD10-I70.0). Electronically Signed   By: Marijo Conception M.D.   On: 08/05/2019 12:19   Ct Head Wo Contrast  Result Date: 08/28/2019 CLINICAL DATA:  Altered level of consciousness. EXAM: CT HEAD WITHOUT CONTRAST TECHNIQUE: Contiguous axial images were obtained from the base of the skull through the vertex without intravenous contrast. COMPARISON:  03/14/2013. FINDINGS: Brain: No evidence of acute infarction, hemorrhage, hydrocephalus, extra-axial collection or mass lesion/mass effect. Scattered low-density changes within the periventricular and subcortical white matter compatible with chronic microvascular ischemic change. Mild diffuse cerebral volume loss. Vascular: Mild atherosclerotic calcifications involving the large vessels of the skull base. No unexpected hyperdense vessel. Skull: Normal. Negative for fracture or focal lesion. Sinuses/Orbits: No acute finding. Other: None. IMPRESSION: 1.  No acute intracranial findings. 2.  Chronic microvascular ischemic change and cerebral volume loss. Electronically Signed   By: Davina Poke M.D.   On: 08/28/2019 16:52   Dg Chest Port 1 View  Result Date: 08/28/2019 CLINICAL DATA:   Cough fatigue, hypotension EXAM: PORTABLE CHEST 1 VIEW COMPARISON:  08/05/2019 FINDINGS: There is no focal consolidation. There is no pleural effusion or pneumothorax. The heart and mediastinal contours are unremarkable. There is no acute osseous abnormality. IMPRESSION: No active disease. Electronically Signed   By: Kathreen Devoid   On: 08/28/2019 16:00   Dg Chest Portable 1 View  Result Date: 08/05/2019 CLINICAL DATA:  Fall, shoulder pain EXAM: PORTABLE CHEST 1 VIEW COMPARISON:  07/01/2019 FINDINGS: Low volume AP portable examination. Mild cardiomegaly. No acute abnormality of the lungs. IMPRESSION: Low volume AP portable examination. Mild cardiomegaly. No acute abnormality of the lungs. Electronically Signed   By: Eddie Candle M.D.   On: 08/05/2019 11:22   Dg Shoulder Left Portable  Result Date: 08/05/2019 CLINICAL DATA:  Fall, shoulder pain EXAM: LEFT SHOULDER - 1 VIEW COMPARISON:  None. FINDINGS: No acute fracture or dislocation of the left shoulder. There is severe arthrosis of the left glenohumeral joint with large osteophytes and multiple calcified loose bodies in the inferior joint recess. Moderate acromioclavicular arthrosis. The partially imaged left chest is unremarkable. IMPRESSION: No acute fracture or dislocation of the left shoulder. There is severe arthrosis of the left glenohumeral joint with  large osteophytes and multiple calcified loose bodies in the inferior joint recess. Electronically Signed   By: Eddie Candle M.D.   On: 08/05/2019 11:16    Microbiology: Recent Results (from the past 240 hour(s))  Blood Culture (routine x 2)     Status: None (Preliminary result)   Collection Time: 08/28/19  4:09 PM   Specimen: BLOOD RIGHT ARM  Result Value Ref Range Status   Specimen Description   Final    BLOOD RIGHT ARM Performed at [facility], [place] [address], [place], [place] [address]    Special Requests   Final    BOTTLES DRAWN AEROBIC AND ANAEROBIC Blood Culture  results may not be optimal due to an inadequate volume of blood received in culture bottles Performed at [facility] [address] [place], [place] [address]    Culture   Final    NO GROWTH 2 DAYS Performed at [facility] [address] [place],  [address]    Report Status PENDING  Incomplete  SARS Coronavirus 2 [facility] order, Performed in [facility] hospital lab) Nasopharyngeal Nasopharyngeal Swab     Status: None   Collection Time: 08/28/19  4:09 PM   Specimen: Nasopharyngeal Swab  Result Value Ref Range Status   SARS Coronavirus 2 NEGATIVE NEGATIVE Final    Comment: (NOTE) If result is NEGATIVE SARS-CoV-2 target nucleic acids are NOT DETECTED. The SARS-CoV-2 RNA is generally detectable in upper and lower  respiratory specimens during the acute phase of infection. The lowest  concentration of SARS-CoV-2 viral copies this assay can detect is 250  copies / mL. A negative result does not preclude SARS-CoV-2 infection  and should not be used as the sole basis for treatment or other  patient management decisions.  A negative result may occur with  improper specimen collection / handling, submission of specimen other  than nasopharyngeal swab, presence of viral mutation(s) within the  areas targeted by this assay, and inadequate number of viral copies  (<250 copies / mL). A negative result must be combined with clinical  observations, patient history, and epidemiological information. If result is POSITIVE SARS-CoV-2 target nucleic acids are DETECTED. The SARS-CoV-2 RNA is generally detectable in upper and lower  respiratory specimens dur ing the acute phase of infection.  Positive  results are indicative of active infection with SARS-CoV-2.  Clinical  correlation with patient history and other diagnostic information is  necessary to determine patient infection status.  Positive results do  not rule out bacterial infection or co-infection with  other viruses. If result is PRESUMPTIVE POSTIVE SARS-CoV-2 nucleic acids MAY BE PRESENT.   A presumptive positive result was obtained on the submitted specimen  and confirmed on repeat testing.  While 2019 novel coronavirus  (SARS-CoV-2) nucleic acids may be present in the submitted sample  additional confirmatory testing may be necessary for epidemiological  and / or clinical management purposes  to differentiate between  SARS-CoV-2 and other Sarbecovirus currently known to infect humans.  If clinically indicated additional testing with an alternate test  methodology [phone]) is advised. The SARS-CoV-2 RNA is generally  detectable in upper and lower respiratory sp ecimens during the acute  phase of infection. The expected result is Negative. Fact Sheet for Patients:  StrictlyIdeas.no Fact Sheet for Healthcare Providers: BankingDealers.co.za This test is not yet approved or cleared by the [place] FDA and has been authorized for detection and/or diagnosis of SARS-CoV-2 by FDA under an Emergency Use Authorization (  EUA).  This EUA will remain in effect (meaning this test can be used) for the duration of the COVID-19 declaration under Section 564(b)(1) of the Act, 21 U.S.C. section 360bbb-3(b)(1), unless the authorization is terminated or revoked sooner. Performed at [facility], [place] [address] [place], [place] [address]   Urine culture     Status: None   Collection Time: 08/28/19  4:40 PM   Specimen: In/Out Cath Urine  Result Value Ref Range Status   Specimen Description   Final    IN/OUT CATH URINE Performed at [facility] [address] [place], [place] [address]    Special Requests   Final    NONE Performed at [facility], [place] [address] [place], [place] [address]    Culture   Final    NO GROWTH Performed at [facility], [place] [address]  [place], [place] [address]    Report Status 08/29/2019 FINAL  Final  Blood Culture (routine x 2)     Status: None (Preliminary result)   Collection Time: 08/28/19  9:51 PM   Specimen: BLOOD LEFT HAND  Result Value Ref Range Status   Specimen Description   Final    BLOOD LEFT HAND Performed at [facility] [address] [place], [place] [address]    Special Requests   Final    BOTTLES DRAWN AEROBIC ONLY Blood Culture adequate volume Performed at [facility] [address] [place], [place] [address]    Culture   Final    NO GROWTH 1 DAY Performed at [facility], [place] [address] [place], [place] [address]    Report Status PENDING  Incomplete     Labs: Basic Metabolic Panel: Recent Labs  Lab 08/28/19 1609 08/28/19 2151 08/29/19 0146  NA 127*  --  127*  K 5.9*  --  5.6*  CL 94*  --  93*  CO2 26  --  25  GLUCOSE 100*  --  92  BUN 35*  --  34*  CREATININE 1.79* 1.93* 1.69*  CALCIUM 8.1*  --  8.4*   Liver Function Tests: Recent Labs  Lab 08/28/19 1609  AST 14*  ALT 14  ALKPHOS 64  BILITOT 0.5  PROT 6.5  ALBUMIN 3.7   No results for input(s): LIPASE, AMYLASE in the last 168 hours. No results for input(s): AMMONIA in the last 168 hours. CBC: Recent Labs  Lab 08/28/19 1609 08/28/19 2151 08/29/19 0146  WBC 13.4* 12.3* 11.3*  NEUTROABS 9.6*  --   --   HGB 9.5* 9.7* 10.7*  HCT 31.0* 31.8* 34.8*  MCV 92.0 91.1 90.6  PLT 334 305 215   Cardiac Enzymes: No results for input(s): CKTOTAL, CKMB, CKMBINDEX, TROPONINI in the last 168 hours. BNP: BNP (last 3 results) No results for input(s): BNP in the last 8760 hours.  ProBNP (last 3 results) No results for input(s): PROBNP in the last 8760 hours.  CBG: No results for input(s): GLUCAP in the last 168 hours.     Signed:  Nita Sells MD   [facility] 08/30/2019, 11:16 AM

## 2019-08-30 NOTE — Evaluation (Signed)
Physical Therapy Evaluation Patient Details Name: Jorge Sherman MRN: [id number] DOB: 09-Jul-1941 Today's Date: 08/30/2019   History of Present Illness  78 yo male admitted to ED on 10/7 from home with AMS, suspect due to polypharmacy vs UTI as pt diagnosed with UTI on 10/5. PMH includes depression, headaches, OSA, spinal stenosis s/p lumbar spine surgeries, DM.  Clinical Impression   Pt presents with generalized weakness, difficulty performing mobility tasks, dizziness upon sitting EOB VSS when assessed, and decreased activity tolerance. Pt to benefit from acute PT to address deficits. Pt tolerated strength assessment and return to supine only, requiring min-mod assist for LE lifting and positioning in bed. Pt states he has 24/7 assist at home, and if this is accurate and pt has physical assist for transfer into and out of w/c, pt appropriate for d/c home with [facility] and personal aide as pt appears to be close to baseline. If 24/7 assist not available, ST-SNF recommended. PT to progress mobility as tolerated, and will continue to follow acutely.      Follow Up Recommendations SNF;Home health PT;Supervision/Assistance - 24 hour    Equipment Recommendations  None recommended by PT    Recommendations for Other Services       Precautions / Restrictions Precautions Precautions: Fall Restrictions Weight Bearing Restrictions: No      Mobility  Bed Mobility Overal bed mobility: Needs Assistance Bed Mobility: Sit to Supine       Sit to supine: HOB elevated;Min assist   General bed mobility comments: Pt sitting EOB upon PT arrival, got to EOB with assist of NT. Min assist for sit to supine for lifting LEs into bed, positioning. Pt refuses to let PT scoot him up in bed.  Transfers                 General transfer comment: pt defers, pt vomiting with sitting EOB.  Ambulation/Gait                Stairs            Wheelchair Mobility    Modified Rankin (Stroke  Patients Only)       Balance Overall balance assessment: Needs assistance Sitting-balance support: Bilateral upper extremity supported;Feet supported Sitting balance-Leahy Scale: Fair Sitting balance - Comments: able to sit EOB without PT support                                     Pertinent Vitals/Pain Pain Assessment: Faces Faces Pain Scale: Hurts little more Pain Location: generalized Pain Descriptors / Indicators: Guarding;Grimacing Pain Intervention(s): Limited activity within patient's tolerance;Monitored during session;Repositioned    Home Living Family/patient expects to be discharged to:: Private residence Living Arrangements: Alone Available Help at Discharge: Personal care attendant;Home health;Available 24 hours/day(pt states he has a personal aide 12 hours a day, then has someone else come in for PM) Type of Home: Other(Comment)(condo) Home Access: Elevator;Level entry     Home Layout: One level Home Equipment: Hope - 2 wheels;Wheelchair - manual      Prior Function Level of Independence: Needs assistance   Gait / Transfers Assistance Needed: Pt states he has been working with HHPT/OT to work on transfers, gets to and from Dealer to wheelchair  ADL's / [place] Needed: per pt, he gets assist for ADLs from personal care aide        Hand Dominance   Dominant Hand: Right  Extremity/Trunk Assessment   Upper Extremity Assessment Upper Extremity Assessment: Generalized weakness    Lower Extremity Assessment Lower Extremity Assessment: Generalized weakness    Cervical / Trunk Assessment Cervical / Trunk Assessment: Normal  Communication   Communication: No difficulties  Cognition Arousal/Alertness: Awake/alert Behavior During Therapy: Anxious Overall Cognitive Status: Impaired/Different from baseline Area of Impairment: Attention;Memory;Following commands;Problem solving                   Current Attention  Level: Focused Memory: Decreased short-term memory Following Commands: Follows one step commands with increased time;Follows one step commands inconsistently     Problem Solving: Decreased initiation;Difficulty sequencing;Requires verbal cues;Requires tactile cues General Comments: A&O x4, pt very anxious stating "I think I am having a panic attack" (RN notified). Pt very scattered and at times difficult to follow in conversation.      General Comments      Exercises     Assessment/Plan    PT Assessment Patient needs continued PT services  PT Problem List Decreased strength;Decreased mobility;Decreased activity tolerance;Decreased balance;Pain;Decreased knowledge of use of DME;Obesity;Decreased cognition       PT Treatment Interventions DME instruction;Therapeutic activities;Gait training;Therapeutic exercise;Patient/family education;Balance training;Functional mobility training    PT Goals (Current goals can be found in the Care Plan section)  Acute Rehab PT Goals Patient Stated Goal: feel better PT Goal Formulation: With patient Time For Goal Achievement: 09/13/19 Potential to Achieve Goals: Good    Frequency Min 3X/week   Barriers to discharge        Co-evaluation               AM-PAC PT "6 Clicks" Mobility  Outcome Measure Help needed turning from your back to your side while in a flat bed without using bedrails?: A Lot Help needed moving from lying on your back to sitting on the side of a flat bed without using bedrails?: A Lot Help needed moving to and from a bed to a chair (including a wheelchair)?: A Lot Help needed standing up from a chair using your arms (e.g., wheelchair or bedside chair)?: A Lot Help needed to walk in hospital room?: Total Help needed climbing 3-5 steps with a railing? : Total 6 Click Score: 10    End of Session Equipment Utilized During Treatment: Oxygen(2LO2) Activity Tolerance: Patient limited by fatigue;Other (comment)(pt  limited by emesis) Patient left: in bed;with call bell/phone within reach;with bed alarm set Nurse Communication: Mobility status;Other (comment)(pt with emesis, asking for RN) PT Visit Diagnosis: Other abnormalities of gait and mobility (R26.89);Muscle weakness (generalized) (M62.81)    Time: [id number] PT Time Calculation (min) (ACUTE ONLY): 14 min   Charges:   PT Evaluation $PT Eval Low Complexity: 1 Low        Deaundra Kutzer Conception Chancy, PT Acute Rehabilitation Services Pager [phone]  Office [phone]   Climmie Buelow D Aideen Fenster 08/30/2019, 10:59 AM

## 2019-08-31 ENCOUNTER — Emergency Department (HOSPITAL_COMMUNITY): Payer: Medicare Other

## 2019-08-31 ENCOUNTER — Encounter (HOSPITAL_COMMUNITY): Payer: Self-pay

## 2019-08-31 ENCOUNTER — Emergency Department (HOSPITAL_COMMUNITY)
Admission: EM | Admit: 2019-08-31 | Discharge: 2019-08-31 | Disposition: A | Discharge status: Home / Self Care | Payer: Medicare Other | Attending: Emergency Medicine | Admitting: Emergency Medicine

## 2019-08-31 ENCOUNTER — Other Ambulatory Visit: Payer: Self-pay

## 2019-08-31 DIAGNOSIS — K8020 Calculus of gallbladder without cholecystitis without obstruction: Secondary | ICD-10-CM | POA: Diagnosis not present

## 2019-08-31 DIAGNOSIS — I2510 Atherosclerotic heart disease of native coronary artery without angina pectoris: Secondary | ICD-10-CM | POA: Diagnosis not present

## 2019-08-31 DIAGNOSIS — R52 Pain, unspecified: Secondary | ICD-10-CM | POA: Insufficient documentation

## 2019-08-31 DIAGNOSIS — E119 Type 2 diabetes mellitus without complications: Secondary | ICD-10-CM | POA: Diagnosis not present

## 2019-08-31 DIAGNOSIS — I10 Essential (primary) hypertension: Secondary | ICD-10-CM | POA: Diagnosis not present

## 2019-08-31 DIAGNOSIS — R079 Chest pain, unspecified: Secondary | ICD-10-CM

## 2019-08-31 DIAGNOSIS — Z79899 Other long term (current) drug therapy: Secondary | ICD-10-CM | POA: Diagnosis not present

## 2019-08-31 DIAGNOSIS — R42 Dizziness and giddiness: Secondary | ICD-10-CM | POA: Diagnosis present

## 2019-08-31 DIAGNOSIS — Z87891 Personal history of nicotine dependence: Secondary | ICD-10-CM | POA: Diagnosis not present

## 2019-08-31 LAB — CBC WITH DIFFERENTIAL/PLATELET
Abs Immature Granulocytes: 0.12 10*3/uL — ABNORMAL HIGH (ref 0.00–0.07)
Basophils Absolute: 0.1 10*3/uL (ref 0.0–0.1)
Basophils Relative: 1 %
Eosinophils Absolute: 0.2 10*3/uL (ref 0.0–0.5)
Eosinophils Relative: 1 %
HCT: 34 % — ABNORMAL LOW (ref 39.0–52.0)
Hemoglobin: 10.6 g/dL — ABNORMAL LOW (ref 13.0–17.0)
Immature Granulocytes: 1 %
Lymphocytes Relative: 7 %
Lymphs Abs: 0.9 10*3/uL (ref 0.7–4.0)
MCH: 28.2 pg (ref 26.0–34.0)
MCHC: 31.2 g/dL (ref 30.0–36.0)
MCV: 90.4 fL (ref 80.0–100.0)
Monocytes Absolute: 1 10*3/uL (ref 0.1–1.0)
Monocytes Relative: 8 %
Neutro Abs: 10.9 10*3/uL — ABNORMAL HIGH (ref 1.7–7.7)
Neutrophils Relative %: 82 %
Platelets: 336 10*3/uL (ref 150–400)
RBC: 3.76 MIL/uL — ABNORMAL LOW (ref 4.22–5.81)
RDW: 14.1 % (ref 11.5–15.5)
WBC: 13.1 10*3/uL — ABNORMAL HIGH (ref 4.0–10.5)
nRBC: 0 % (ref 0.0–0.2)

## 2019-08-31 LAB — COMPREHENSIVE METABOLIC PANEL
ALT: 11 U/L (ref 0–44)
AST: 17 U/L (ref 15–41)
Albumin: 3.8 g/dL (ref 3.5–5.0)
Alkaline Phosphatase: 67 U/L (ref 38–126)
Anion gap: 12 (ref 5–15)
BUN: 9 mg/dL (ref 8–23)
CO2: 26 mmol/L (ref 22–32)
Calcium: 9.7 mg/dL (ref 8.9–10.3)
Chloride: 99 mmol/L (ref 98–111)
Creatinine, Ser: 0.78 mg/dL (ref 0.61–1.24)
GFR calc Af Amer: >60 mL/min (ref >60)
GFR calc non Af Amer: >60 mL/min (ref >60)
Glucose, Bld: 123 mg/dL — ABNORMAL HIGH (ref 70–99)
Potassium: 4.6 mmol/L (ref 3.5–5.1)
Sodium: 137 mmol/L (ref 135–145)
Total Bilirubin: 0.3 mg/dL (ref 0.3–1.2)
Total Protein: 6.9 g/dL (ref 6.5–8.1)

## 2019-08-31 LAB — URINALYSIS, ROUTINE W REFLEX MICROSCOPIC
Bilirubin Urine: NEGATIVE
Glucose, UA: NEGATIVE mg/dL
Hgb urine dipstick: NEGATIVE
Ketones, ur: 5 mg/dL — AB
Leukocytes,Ua: NEGATIVE
Nitrite: NEGATIVE
Protein, ur: NEGATIVE mg/dL
Specific Gravity, Urine: 1.011 (ref 1.005–1.030)
pH: 6 (ref 5.0–8.0)

## 2019-08-31 LAB — TROPONIN I (HIGH SENSITIVITY)
Troponin I (High Sensitivity): 5 ng/L (ref <18)
Troponin I (High Sensitivity): 5 ng/L (ref <18)

## 2019-08-31 LAB — LIPASE, BLOOD: Lipase: 37 U/L (ref 11–51)

## 2019-08-31 MED ORDER — HYDROMORPHONE HCL 1 MG/ML IJ SOLN
1.0000 mg | Freq: Once | INTRAMUSCULAR | Status: AC
  Administered 2019-08-31: 1 mg via INTRAVENOUS
  Filled 2019-08-31: qty 1

## 2019-08-31 MED ORDER — ONDANSETRON HCL 4 MG/2ML IJ SOLN
4.0000 mg | Freq: Once | INTRAMUSCULAR | Status: AC
  Administered 2019-08-31: 15:00:00 4 mg via INTRAVENOUS
  Filled 2019-08-31: qty 2

## 2019-08-31 MED ORDER — LORAZEPAM 1 MG PO TABS
1.0000 mg | ORAL_TABLET | Freq: Once | ORAL | Status: AC
  Administered 2019-08-31: 20:00:00 1 mg via ORAL
  Filled 2019-08-31: qty 1

## 2019-08-31 MED ORDER — LORAZEPAM 1 MG PO TABS
1.0000 mg | ORAL_TABLET | Freq: Once | ORAL | Status: AC
  Administered 2019-08-31: 17:00:00 1 mg via ORAL
  Filled 2019-08-31: qty 1

## 2019-08-31 MED ORDER — LIDOCAINE 5 % EX PTCH
1.0000 | MEDICATED_PATCH | CUTANEOUS | Status: DC
  Administered 2019-08-31: 18:00:00 1 via TRANSDERMAL
  Filled 2019-08-31: qty 1

## 2019-08-31 MED ORDER — SODIUM CHLORIDE 0.9 % IV BOLUS
1000.0000 mL | Freq: Once | INTRAVENOUS | Status: AC
  Administered 2019-08-31: 16:00:00 1000 mL via INTRAVENOUS

## 2019-08-31 MED ORDER — FENTANYL CITRATE (PF) 100 MCG/2ML IJ SOLN
100.0000 ug | Freq: Once | INTRAMUSCULAR | Status: AC
  Administered 2019-08-31: 100 ug via INTRAVENOUS
  Filled 2019-08-31: qty 2

## 2019-08-31 NOTE — ED Triage Notes (Addendum)
Pt from home with ems for initial c.o fall, pt then c.o dizziness and headache prior to the fall. Pt also having chest pain severe chronic lower back pain. Pt denies hitting his head or any LOC when he fell. Pt given 139mcg fentanyl and 4mg  zofran IV en route. Pt arrives alert, oriented. VSS

## 2019-08-31 NOTE — ED Provider Notes (Signed)
[facility] EMERGENCY DEPARTMENT Provider Note   CSN: [id number] Arrival date & time: 08/31/19  1302     History   Chief Complaint Chief Complaint  Patient presents with   Dizziness   Headache   Chest Pain    HPI Jorge Sherman is a 78 y.o. male.     HPI   Patient is a 78 year old male with a history of hypertension, OSA, diabetes, cardiovascular disease, who presents to the emergency department today for evaluation after a fall.  Patient states he fell out of bed onto his back.  He thinks he might of hit his head but did not pass out.  He is complaining of pain to his head neck and lower back.  He is also complaining of midsternal chest pain and some shortness of breath. He has some numbness to his hands and feet that he has prior to the fall. He is not sure if it is worse. He states he is having some trouble talking and swallowing.   Past Medical History:  Diagnosis Date   Chronic pain    Depressive disorder, not elsewhere classified    Dizziness and giddiness    Essential and other specified forms of tremor    Headache(784.0)    Leukocytosis, unspecified    Obstructive sleep apnea (adult) (pediatric)    Pain in joint, lower leg    Septic joint of left knee joint ([place]) 04/22/2019   Spinal stenosis, lumbar region, without neurogenic claudication    Thoracic or lumbosacral neuritis or radiculitis, unspecified    Type II or unspecified type diabetes mellitus without mention of complication, not stated as uncontrolled    Unspecified cardiovascular disease    Unspecified essential hypertension     Patient Active Problem List   Diagnosis Date Noted   Altered mental status 08/28/2019   Septic joint of left knee joint ([place]) 04/22/2019   Erectile dysfunction 10/30/2013   Essential hypertension, benign 10/30/2013   Coronary atherosclerosis of native coronary artery 10/30/2013   Dizziness and giddiness    Pain in joint, lower leg      Leukocytosis, unspecified    Type II or unspecified type diabetes mellitus without mention of complication, not stated as uncontrolled    Obstructive sleep apnea    Depressive disorder, not elsewhere classified    Essential and other specified forms of tremor    Headache(784.0)    Spinal stenosis, lumbar region, without neurogenic claudication    Thoracic or lumbosacral neuritis or radiculitis, unspecified    Chronic pain     Past Surgical History:  Procedure Laterality Date   BACK SURGERY     back surgey     times 3        Home Medications    Prior to Admission medications   Medication Sig Start Date End Date Taking? Authorizing Provider  amLODipine (NORVASC) 2.5 MG tablet Take 2.5 mg by mouth daily.    [provider]  buprenorphine-naloxone (SUBOXONE) 8-2 mg SUBL SL tablet Place 0.5 tablets under the tongue 2 (two) times daily.    [provider]  celecoxib (CELEBREX) 200 MG capsule Take 200 mg by mouth 2 (two) times daily.    [provider]  cloNIDine (CATAPRES) 0.1 MG tablet Take 0.1 mg by mouth 2 (two) times daily.     [provider]  DULoxetine (CYMBALTA) 30 MG capsule Take 30 mg by mouth 3 (three) times daily.     [provider]  ferrous sulfate  325 (65 FE) MG tablet Take 325 mg by mouth daily.     [provider]  gabapentin (NEURONTIN) 300 MG capsule Take 2 capsules (600 mg total) by mouth 3 (three) times daily. 08/30/19   Nita Sells, MD  hydroxychloroquine (PLAQUENIL) 200 MG tablet Take 200 mg by mouth 2 (two) times daily.    [provider]  Iron-FA-B Cmp-C-Biot-Probiotic (FUSION PLUS PO) Take 1 capsule by mouth daily.    [provider]  linaclotide (LINZESS) 72 MCG capsule Take 72 mcg by mouth daily before breakfast.    [provider]  LORazepam (ATIVAN) 1 MG tablet Take 2 mg by mouth every 6 (six) hours as needed for anxiety.     [provider]   omeprazole (PRILOSEC) 40 MG capsule Take 40 mg by mouth daily.    [provider]  rosuvastatin (CRESTOR) 40 MG tablet Take 40 mg by mouth every evening.     [provider]  tamsulosin (FLOMAX) 0.4 MG CAPS capsule Take 0.4 mg by mouth daily.    [provider]    Family History Family History  Problem Relation Age of Onset   Cancer Father    Heart disease Father    Stroke Mother    Diabetes Mother     Social History Social History   Tobacco Use   Smoking status: Former Smoker   Smokeless tobacco: Never Used  Substance Use Topics   Alcohol use: No   Drug use: No     Allergies   Iohexol, Fentanyl, and Iodine   Review of Systems Review of Systems  Constitutional: Negative for fever.  HENT: Negative for ear pain and sore throat.   Eyes: Negative for visual disturbance.  Respiratory: Positive for shortness of breath. Negative for cough.   Cardiovascular: Positive for chest pain.  Gastrointestinal: Negative for abdominal pain, constipation, diarrhea, nausea and vomiting.  Genitourinary: Negative for dysuria.  Musculoskeletal: Positive for back pain.  Skin: Negative for rash.  Neurological: Positive for headaches.  All other systems reviewed and are negative.    Physical Exam Updated Vital Signs BP (!) 177/86    Pulse 93    Temp 98.2 F (36.8 C) (Oral)    Resp (!) 26    Ht 5\' 10"  (1.778 m)    Wt 126 kg    SpO2 96%    BMI 39.86 kg/m   Physical Exam Vitals signs and nursing note reviewed.  Constitutional:      Appearance: He is well-developed.  HENT:     Head: Normocephalic and atraumatic.  Eyes:     Conjunctiva/sclera: Conjunctivae normal.  Neck:     Musculoskeletal: Neck supple.  Cardiovascular:     Rate and Rhythm: Normal rate and regular rhythm.     Heart sounds: Normal heart sounds. No murmur.  Pulmonary:     Effort: Pulmonary effort is normal. No respiratory distress.     Breath sounds: Normal breath sounds.   Abdominal:     Palpations: Abdomen is soft.     Tenderness: There is no abdominal tenderness.  Musculoskeletal:     Comments: TTP to the cervical, upper thoracic, and lumbar spine. Old ecchymosis to the right knee  Skin:    General: Skin is warm and dry.  Neurological:     Mental Status: He is alert.     Comments: Mental Status:  Alert, thought content appropriate, able to give a coherent history. Speech fluent without evidence of aphasia. Able to follow 2 step  commands without difficulty.  Cranial Nerves:  II:  Peripheral visual fields grossly normal, pupils equal, round, reactive to light III,IV, VI: ptosis not present, extra-ocular motions intact bilaterally  V,VII: smile symmetric, facial light touch sensation equal VIII: hearing grossly normal to voice  X: uvula elevates symmetrically  XI: bilateral shoulder shrug symmetric and strong XII: midline tongue extension without fassiculations Motor:  Normal tone. 5/5 strength of BUE and BLE major muscle groups including strong and equal grip strength and dorsiflexion/plantar flexion Sensory: light touch normal in all extremities. CV: 2+ radial and DP pulses       ED Treatments / Results  Labs (all labs ordered are listed, but only abnormal results are displayed) Labs Reviewed  CBC WITH DIFFERENTIAL/PLATELET - Abnormal; Notable for the following components:      Result Value   WBC 13.1 (*)    RBC 3.76 (*)    Hemoglobin 10.6 (*)    HCT 34.0 (*)    Neutro Abs 10.9 (*)    Abs Immature Granulocytes 0.12 (*)    All other components within normal limits  COMPREHENSIVE METABOLIC PANEL  URINALYSIS, ROUTINE W REFLEX MICROSCOPIC  LIPASE, BLOOD  TROPONIN I (HIGH SENSITIVITY)  TROPONIN I (HIGH SENSITIVITY)    EKG EKG Interpretation  Date/Time:  Saturday August 31 2019 13:11:58 EDT Ventricular Rate:  95 PR Interval:    QRS Duration: 94 QT Interval:  355 QTC Calculation: 447 R Axis:   -48 Text Interpretation:  Sinus  rhythm Ventricular premature complex Left anterior fascicular block Abnormal R-wave progression, early transition No significant change since last tracing Confirmed by Deno Etienne [phone]) on 08/31/2019 1:21:14 PM   Radiology Dg Chest 1 View  Result Date: 08/31/2019 CLINICAL DATA:  Upper back pain post fall. EXAM: CHEST  1 VIEW COMPARISON:  August 28, 2019 FINDINGS: Cardiomediastinal silhouette is normal. Mediastinal contours appear intact. There is no evidence of focal airspace consolidation, pleural effusion or pneumothorax. Low lung volumes. Moderate to severe osteoarthritic changes of the left shoulder. Soft tissues are grossly normal. IMPRESSION: No active disease. Electronically Signed   By: Fidela Salisbury M.D.   On: 08/31/2019 14:40   Dg Thoracic Spine 2 View  Result Date: 08/31/2019 CLINICAL DATA:  Upper back pain after fall. EXAM: THORACIC SPINE 2 VIEWS COMPARISON:  None. FINDINGS: No fracture or spondylolisthesis is noted. Moderate multilevel degenerative disc disease is noted in the lower thoracic spine. IMPRESSION: Moderate multilevel degenerative disc disease. No acute abnormality seen in the thoracic spine. Electronically Signed   By: Marijo Conception M.D.   On: 08/31/2019 14:43   Ct Head Wo Contrast  Result Date: 08/31/2019 CLINICAL DATA:  Posttraumatic headache after fall. EXAM: CT HEAD WITHOUT CONTRAST CT CERVICAL SPINE WITHOUT CONTRAST TECHNIQUE: Multidetector CT imaging of the head and cervical spine was performed following the standard protocol without intravenous contrast. Multiplanar CT image reconstructions of the cervical spine were also generated. COMPARISON:  August 28, 2019. FINDINGS: CT HEAD FINDINGS Brain: Mild chronic ischemic white matter disease is noted. Mild diffuse cortical atrophy is noted. No mass effect or midline shift is noted. Ventricular size is within normal limits. There is no evidence of mass lesion, hemorrhage or acute infarction. Vascular: No  hyperdense vessel or unexpected calcification. Skull: Normal. Negative for fracture or focal lesion. Sinuses/Orbits: Mild left sphenoid sinusitis is noted. Other: None. CT CERVICAL SPINE FINDINGS Alignment: Mild grade 1 anterolisthesis of C3-4 and C4-5 secondary to posterior facet joint hypertrophy. Skull base and vertebrae: No acute fracture.  No primary bone lesion or focal pathologic process. Soft tissues and spinal canal: No prevertebral fluid or swelling. No visible canal hematoma. Disc levels: Severe degenerative disc disease is noted at C4-5, C5-6 and C6-7 with anterior posterior osteophyte formation. Upper chest: Negative. Other: Degenerative changes are seen involving posterior facet joints bilaterally. IMPRESSION: Mild chronic ischemic white matter disease. Mild diffuse cortical atrophy. No acute intracranial abnormality seen. Severe multilevel degenerative disc disease. No acute abnormality seen in the cervical spine. Electronically Signed   By: Marijo Conception M.D.   On: 08/31/2019 14:23   Ct Cervical Spine Wo Contrast  Result Date: 08/31/2019 CLINICAL DATA:  Posttraumatic headache after fall. EXAM: CT HEAD WITHOUT CONTRAST CT CERVICAL SPINE WITHOUT CONTRAST TECHNIQUE: Multidetector CT imaging of the head and cervical spine was performed following the standard protocol without intravenous contrast. Multiplanar CT image reconstructions of the cervical spine were also generated. COMPARISON:  August 28, 2019. FINDINGS: CT HEAD FINDINGS Brain: Mild chronic ischemic white matter disease is noted. Mild diffuse cortical atrophy is noted. No mass effect or midline shift is noted. Ventricular size is within normal limits. There is no evidence of mass lesion, hemorrhage or acute infarction. Vascular: No hyperdense vessel or unexpected calcification. Skull: Normal. Negative for fracture or focal lesion. Sinuses/Orbits: Mild left sphenoid sinusitis is noted. Other: None. CT CERVICAL SPINE FINDINGS Alignment:  Mild grade 1 anterolisthesis of C3-4 and C4-5 secondary to posterior facet joint hypertrophy. Skull base and vertebrae: No acute fracture. No primary bone lesion or focal pathologic process. Soft tissues and spinal canal: No prevertebral fluid or swelling. No visible canal hematoma. Disc levels: Severe degenerative disc disease is noted at C4-5, C5-6 and C6-7 with anterior posterior osteophyte formation. Upper chest: Negative. Other: Degenerative changes are seen involving posterior facet joints bilaterally. IMPRESSION: Mild chronic ischemic white matter disease. Mild diffuse cortical atrophy. No acute intracranial abnormality seen. Severe multilevel degenerative disc disease. No acute abnormality seen in the cervical spine. Electronically Signed   By: Marijo Conception M.D.   On: 08/31/2019 14:23   Ct L-spine No Charge  Result Date: 08/31/2019 CLINICAL DATA:  Severe chronic low back pain.  Fall. EXAM: CT ABDOMEN AND PELVIS WITHOUT CONTRAST CT LUMBAR SPINE WITHOUT CONTRAST TECHNIQUE: Multidetector CT imaging of the abdomen and pelvis was performed following the standard protocol without IV contrast. COMPARISON:  08/05/2019 FINDINGS: CT ABDOMEN PELVIS: Lower chest: No acute findings.  Coronary artery calcifications. Hepatobiliary: No focal liver abnormality is seen. Trace amount of layering hyperdensity within the gallbladder lumen, possibly sludge or small stones. No gallbladder wall thickening or inflammatory changes are evident. Pancreas: Unremarkable. No pancreatic ductal dilatation or surrounding inflammatory changes. Spleen: Normal in size without focal abnormality. Adrenals/Urinary Tract: Stable 1.5 cm fat containing left adrenal myelolipoma. Right adrenal gland unremarkable. Unchanged appearance of the kidneys. No hydronephrosis. Ureters nondilated. Urinary bladder unremarkable. Stomach/Bowel: Stomach is within normal limits. Appendix not visualized. Scattered colonic diverticulosis. No evidence of bowel  wall thickening, distention, or inflammatory changes. Vascular/Lymphatic: Aortic atherosclerosis. No enlarged abdominal or pelvic lymph nodes. Reproductive: Prostate is unremarkable. Other: No ascites.  No pneumoperitoneum. Musculoskeletal: The imaged lower thorax and pelvis are intact without acute osseous abnormality. Bilateral hips are intact without fracture or dislocation. Similar degenerative changes. CT LUMBAR SPINE: Alignment: No static listhesis. Vertebrae: Prior posterior lumbar interbody fusion from L2-L5 hardware is intact and unchanged in alignment without periprosthetic lucency or periprosthetic fracture. The S1 hardware remains in place, not contiguous with the above fusion levels, unchanged.  Interbody spacers present at L2-3 and L3-4. Chronic compression deformity of L1 with approximately 25% vertebral body height loss, which is unchanged. There is prior cement augmentation in the L1 vertebrae. No new fractures. Remaining vertebral body heights are maintained. Disc levels: Level by level detailed evaluation of the lumbar spine is limited by CT secondary to metallic streak artifact from extensive fusion hardware. T12-L1: Degenerative endplate spurring and bilateral facet arthropathy without high-grade foraminal or canal stenosis. L1-L2: Degenerative endplate spurring, mild bilateral facet arthrosis without high-grade canal stenosis. There is probable right foraminal stenosis. L2-L3: Prior interbody fusion and posterior decompression. Probable mild-to-moderate right foraminal stenosis. L3-L4: Prior interbody fusion and posterior decompression. No definite foraminal or canal stenosis. L4-L5: No definite foraminal or canal stenosis. L5-S1: No definite foraminal or canal stenosis. IMPRESSION: 1. No acute abdominopelvic findings. 2. Possible trace biliary sludge and/or small stones within the gallbladder lumen without evidence of inflammation. 3. Extensive prior lumbosacral fusion with chronic degenerative  changes, which do not appear significantly progressed compared to prior studies. 4. Chronic L1 vertebral body compression fracture status post cement augmentation. No new or progressive vertebral body fractures. 5. Colonic diverticulosis without evidence of diverticulitis. Electronically Signed   By: Davina Poke M.D.   On: 08/31/2019 14:41   Dg Knee Complete 4 Views Right  Result Date: 08/31/2019 CLINICAL DATA:  Knee pain post fall. EXAM: RIGHT KNEE - COMPLETE 4+ VIEW COMPARISON:  None. FINDINGS: No evidence of fracture, dislocation, or joint effusion. Moderate to severe 3 compartment osteoarthritic changes of the right knee. Prepatellar soft tissue swelling, mild. IMPRESSION: 1. No acute fracture or dislocation identified about the right knee. 2. Moderate to severe 3 compartment osteoarthritic changes of the right knee. Electronically Signed   By: Fidela Salisbury M.D.   On: 08/31/2019 14:42   Ct Renal Stone Study  Result Date: 08/31/2019 CLINICAL DATA:  Severe chronic low back pain.  Fall. EXAM: CT ABDOMEN AND PELVIS WITHOUT CONTRAST CT LUMBAR SPINE WITHOUT CONTRAST TECHNIQUE: Multidetector CT imaging of the abdomen and pelvis was performed following the standard protocol without IV contrast. COMPARISON:  08/05/2019 FINDINGS: CT ABDOMEN PELVIS: Lower chest: No acute findings.  Coronary artery calcifications. Hepatobiliary: No focal liver abnormality is seen. Trace amount of layering hyperdensity within the gallbladder lumen, possibly sludge or small stones. No gallbladder wall thickening or inflammatory changes are evident. Pancreas: Unremarkable. No pancreatic ductal dilatation or surrounding inflammatory changes. Spleen: Normal in size without focal abnormality. Adrenals/Urinary Tract: Stable 1.5 cm fat containing left adrenal myelolipoma. Right adrenal gland unremarkable. Unchanged appearance of the kidneys. No hydronephrosis. Ureters nondilated. Urinary bladder unremarkable. Stomach/Bowel:  Stomach is within normal limits. Appendix not visualized. Scattered colonic diverticulosis. No evidence of bowel wall thickening, distention, or inflammatory changes. Vascular/Lymphatic: Aortic atherosclerosis. No enlarged abdominal or pelvic lymph nodes. Reproductive: Prostate is unremarkable. Other: No ascites.  No pneumoperitoneum. Musculoskeletal: The imaged lower thorax and pelvis are intact without acute osseous abnormality. Bilateral hips are intact without fracture or dislocation. Similar degenerative changes. CT LUMBAR SPINE: Alignment: No static listhesis. Vertebrae: Prior posterior lumbar interbody fusion from L2-L5 hardware is intact and unchanged in alignment without periprosthetic lucency or periprosthetic fracture. The S1 hardware remains in place, not contiguous with the above fusion levels, unchanged. Interbody spacers present at L2-3 and L3-4. Chronic compression deformity of L1 with approximately 25% vertebral body height loss, which is unchanged. There is prior cement augmentation in the L1 vertebrae. No new fractures. Remaining vertebral body heights are maintained. Disc levels: Level by  level detailed evaluation of the lumbar spine is limited by CT secondary to metallic streak artifact from extensive fusion hardware. T12-L1: Degenerative endplate spurring and bilateral facet arthropathy without high-grade foraminal or canal stenosis. L1-L2: Degenerative endplate spurring, mild bilateral facet arthrosis without high-grade canal stenosis. There is probable right foraminal stenosis. L2-L3: Prior interbody fusion and posterior decompression. Probable mild-to-moderate right foraminal stenosis. L3-L4: Prior interbody fusion and posterior decompression. No definite foraminal or canal stenosis. L4-L5: No definite foraminal or canal stenosis. L5-S1: No definite foraminal or canal stenosis. IMPRESSION: 1. No acute abdominopelvic findings. 2. Possible trace biliary sludge and/or small stones within the  gallbladder lumen without evidence of inflammation. 3. Extensive prior lumbosacral fusion with chronic degenerative changes, which do not appear significantly progressed compared to prior studies. 4. Chronic L1 vertebral body compression fracture status post cement augmentation. No new or progressive vertebral body fractures. 5. Colonic diverticulosis without evidence of diverticulitis. Electronically Signed   By: Davina Poke M.D.   On: 08/31/2019 14:41    Procedures Procedures (including critical care time)  Medications Ordered in ED Medications  sodium chloride 0.9 % bolus 1,000 mL (has no administration in time range)  fentaNYL (SUBLIMAZE) injection 100 mcg (100 mcg Intravenous Given 08/31/19 1332)  ondansetron (ZOFRAN) injection 4 mg (4 mg Intravenous Given 08/31/19 1503)  HYDROmorphone (DILAUDID) injection 1 mg (1 mg Intravenous Given 08/31/19 1503)  HYDROmorphone (DILAUDID) injection 1 mg (1 mg Intravenous Given 08/31/19 1529)     Initial Impression / Assessment and Plan / ED Course  I have reviewed the triage vital signs and the nursing notes.  Pertinent labs & imaging results that were available during my care of the patient were reviewed by me and considered in my medical decision making (see chart for details).   Final Clinical Impressions(s) / ED Diagnoses   Final diagnoses:  Pain  Gallstones   78 year old man presenting for evaluation after fall.  Complaining of back pain head trauma with subsequent headache, neck pain, chest pain/shortness of breath. He also c/o trouble talking and swallowing.   CBC with mild leukocytosis, anemia at baseline.  CMP pending Lipase pending Trop pending UA pending  EKG  Sinus rhythm Ventricular premature complex Left anterior fascicular block Abnormal R-wave progression, early transition No significant change since last tracing  CXR negative  Xray thoracic spine moderate multilevel degenerative disc disease. No acute abnormality  seen in the thoracic spine. Xray right knee no acute fracture or dislocation identified about the right knee. Moderate to severe 3 compartment osteoarthritic changes of the right knee.  CT head/cervical spine mild chronic ischemic white matter disease. Mild diffuse cortical atrophy. No acute intracranial abnormality seen. Severe multilevel degenerative disc disease. No acute abnormality seen in the cervical spine.  CT abd/pelvis and lumbar spine with no acute abdominopelvic findings. Possible trace biliary sludge and/or small stones within the gallbladder lumen without evidence of inflammation. Extensive prior lumbosacral fusion with chronic degenerative changes, which do not appear significantly progressed compared to prior studies. 4. Chronic L1 vertebral body compression fracture status post cement augmentation. No new or progressive vertebral body fractures. 5. Colonic diverticulosis without evidence of diverticulitis.  Given pts report of difficulty speaking and swallowing an MRI Was recommended for the patient but he declined. He has been asking to be discharged. He does agree to have RUQ [place] completed.   At shift change, lab work, UA, and RUQ [place] are pending. Care transitioned to [facility], PA-C with plan to f/u on pending labs and  [place]. If negative anticipate discharge home.  Pt was seen in conjunction with Dr. Tyrone Nine who personally evaluated the patient and is in agreement with this plan.   ED Discharge Orders    None       Rodney Booze, PA-C 08/31/19 [place], [place], DO 08/31/19 1554

## 2019-08-31 NOTE — ED Notes (Signed)
Patient Alert and oriented to baseline. Stable and ambulatory to baseline. Patient verbalized understanding of the discharge instructions.  Patient belongings were taken by the patient.   

## 2019-08-31 NOTE — ED Notes (Addendum)
Pt could not pee at this time

## 2019-08-31 NOTE — ED Notes (Signed)
Update given to daughter, Corey Harold called for transport. Code to get in apt is 1313

## 2019-08-31 NOTE — Discharge Instructions (Addendum)
You were found to have gallstones on your work-up today.  This may be the cause of your worsening you should follow-up with family care doctor regarding this. Continue taking your home medications as prescribed. Your urine was negative, there were no signs of infection. We recommend that she get further evaluation of your confusion and difficulty swallowing.  Follow-up with your primary care doctor who may want to order outpatient MRI or have you follow up with neurology.  Return to the ER with any new, worsening, or concerning symptoms.

## 2019-08-31 NOTE — ED Notes (Signed)
Pt desat to 86% on room air. Pt placed on 2L [place], oxygen saturation now 99%

## 2019-08-31 NOTE — ED Provider Notes (Signed)
  Physical Exam  BP (!) 177/86   Pulse 93   Temp 98.2 F (36.8 C) (Oral)   Resp (!) 26   Ht 5\' 10"  (1.778 m)   Wt 126 kg   SpO2 96%   BMI 39.86 kg/m   Physical Exam   Gen: nontoxic Psych: anxious  ED Course/Procedures     Procedures  MDM   Pt signed out to me by C Couture, PA-C. Please see previous notes for further history.   In brief, patient presenting for evaluation after fall.  Reporting back pain.  Imaging without acute findings.  Patient also reporting expressive aphasia and some difficulty swallowing, but refusing MRI.  Patient requesting to go home, however willing to allow labs and urine prior to discharge.   Labs show slight leukocytosis at 13, but no obvious source of infection.  Chest x-ray clear.  Urine clear.  Electrolytes stable.  Troponin without change.  On my evaluation, patient is anxious/agitated, improved with Ativan which he takes at home.  Effusion improves when his anxiety improves, likely correlation.  I once again discussed with patient I would recommend that he stay for further evaluation of his brain including MRI, patient once again refused.  Discussed importance of close outpatient follow-up with his primary care doctor.  At this time, patient received a discharge.  Return precautions given.  Patient states he understands and agrees to plan.      Franchot Heidelberg, PA-C 08/31/19 2110    Sherwood Gambler, MD 09/01/19 [phone]

## 2019-09-02 LAB — CULTURE, BLOOD (ROUTINE X 2): Culture: NO GROWTH

## 2019-09-03 LAB — CULTURE, BLOOD (ROUTINE X 2)
Culture: NO GROWTH
Special Requests: ADEQUATE

## 2019-09-18 ENCOUNTER — Emergency Department (HOSPITAL_COMMUNITY)
Admission: EM | Admit: 2019-09-18 | Discharge: 2019-09-19 | Disposition: A | Discharge status: Home / Self Care | Payer: Medicare Other | Attending: Emergency Medicine | Admitting: Emergency Medicine

## 2019-09-18 ENCOUNTER — Other Ambulatory Visit: Payer: Self-pay

## 2019-09-18 ENCOUNTER — Emergency Department (HOSPITAL_COMMUNITY): Payer: Medicare Other

## 2019-09-18 DIAGNOSIS — I2510 Atherosclerotic heart disease of native coronary artery without angina pectoris: Secondary | ICD-10-CM | POA: Insufficient documentation

## 2019-09-18 DIAGNOSIS — T839XXA Unspecified complication of genitourinary prosthetic device, implant and graft, initial encounter: Secondary | ICD-10-CM

## 2019-09-18 DIAGNOSIS — I10 Essential (primary) hypertension: Secondary | ICD-10-CM | POA: Diagnosis not present

## 2019-09-18 DIAGNOSIS — E119 Type 2 diabetes mellitus without complications: Secondary | ICD-10-CM | POA: Diagnosis not present

## 2019-09-18 DIAGNOSIS — Z87891 Personal history of nicotine dependence: Secondary | ICD-10-CM | POA: Insufficient documentation

## 2019-09-18 DIAGNOSIS — K5730 Diverticulosis of large intestine without perforation or abscess without bleeding: Secondary | ICD-10-CM | POA: Diagnosis not present

## 2019-09-18 DIAGNOSIS — Z79899 Other long term (current) drug therapy: Secondary | ICD-10-CM | POA: Diagnosis not present

## 2019-09-18 DIAGNOSIS — T83091A Other mechanical complication of indwelling urethral catheter, initial encounter: Secondary | ICD-10-CM | POA: Diagnosis present

## 2019-09-18 DIAGNOSIS — Y738 Miscellaneous gastroenterology and urology devices associated with adverse incidents, not elsewhere classified: Secondary | ICD-10-CM | POA: Diagnosis not present

## 2019-09-18 MED ORDER — BARO-CAT PO
10.00 | ORAL | Status: DC
Start: ? — End: 2019-09-18

## 2019-09-18 MED ORDER — Medication
Status: DC
Start: ? — End: 2019-09-18

## 2019-09-18 NOTE — Discharge Instructions (Signed)
Please follow-up with [facility] in regards to management of your Foley catheter.  Return to the emergency department for any new or worsening symptoms.
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
## 2019-09-18 NOTE — ED Notes (Signed)
Foley inserted and peri care provided. Will continue to monitor.

## 2019-09-18 NOTE — ED Notes (Signed)
Pt was verbalized discharge instructions. Pt had no further questions at this time. NAD. 
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
## 2019-09-18 NOTE — ED Notes (Signed)
[facility] called for transportation. Paperwork at nurses station. 

## 2019-12-25 ENCOUNTER — Telehealth: Payer: Self-pay

## 2019-12-25 MED ORDER — DULOXETINE HCL 30 MG PO CPEP
30.00 | ORAL_CAPSULE | ORAL | Status: DC
Start: 2020-01-02 — End: 2019-12-25

## 2019-12-25 MED ORDER — HYDROXYCHLOROQUINE SULFATE 200 MG PO TABS
200.00 | ORAL_TABLET | ORAL | Status: DC
Start: 2020-01-02 — End: 2019-12-25

## 2019-12-25 MED ORDER — PANTOPRAZOLE SODIUM 40 MG PO TBEC
40.00 | DELAYED_RELEASE_TABLET | ORAL | Status: DC
Start: 2020-01-03 — End: 2019-12-25

## 2019-12-25 MED ORDER — THIAMINE HCL 100 MG PO TABS
200.00 | ORAL_TABLET | ORAL | Status: DC
Start: 2020-01-03 — End: 2019-12-25

## 2019-12-25 MED ORDER — POLYETHYLENE GLYCOL 3350 17 GM/SCOOP PO POWD
17.00 | ORAL | Status: DC
Start: ? — End: 2019-12-25

## 2019-12-25 MED ORDER — GENERIC EXTERNAL MEDICATION
Status: DC
Start: ? — End: 2019-12-25

## 2019-12-25 MED ORDER — ASCORBIC ACID 500 MG PO TABS
500.00 | ORAL_TABLET | ORAL | Status: DC
Start: 2020-01-02 — End: 2019-12-25

## 2019-12-25 MED ORDER — FERROUS SULFATE 325 (65 FE) MG PO TABS
325.00 | ORAL_TABLET | ORAL | Status: DC
Start: 2020-01-03 — End: 2019-12-25

## 2019-12-25 MED ORDER — MELATONIN 3 MG PO TABS
3.00 | ORAL_TABLET | ORAL | Status: DC
Start: 2019-12-25 — End: 2019-12-25

## 2019-12-25 MED ORDER — GABAPENTIN 400 MG PO CAPS
400.00 | ORAL_CAPSULE | ORAL | Status: DC
Start: 2020-01-02 — End: 2019-12-25

## 2019-12-25 MED ORDER — BISACODYL 10 MG RE SUPP
10.00 | RECTAL | Status: DC
Start: ? — End: 2019-12-25

## 2019-12-25 MED ORDER — LABETALOL HCL 5 MG/ML IV SOLN
20.00 | INTRAVENOUS | Status: DC
Start: ? — End: 2019-12-25

## 2019-12-25 MED ORDER — LIDOCAINE-MENTHOL 3.6-1.25 % EX PTCH
1.00 | MEDICATED_PATCH | CUTANEOUS | Status: DC
Start: 2020-01-02 — End: 2019-12-25

## 2019-12-25 MED ORDER — ALUM & MAG HYDROXIDE-SIMETH 200-200-20 MG/5ML PO SUSP
30.00 | ORAL | Status: DC
Start: ? — End: 2019-12-25

## 2019-12-25 MED ORDER — ROSUVASTATIN CALCIUM 40 MG PO TABS
40.00 | ORAL_TABLET | ORAL | Status: DC
Start: 2020-01-03 — End: 2019-12-25

## 2019-12-25 MED ORDER — ALBUTEROL SULFATE HFA 108 (90 BASE) MCG/ACT IN AERS
2.00 | INHALATION_SPRAY | RESPIRATORY_TRACT | Status: DC
Start: ? — End: 2019-12-25

## 2019-12-25 MED ORDER — BENZONATATE 100 MG PO CAPS
100.00 | ORAL_CAPSULE | ORAL | Status: DC
Start: ? — End: 2019-12-25

## 2019-12-25 MED ORDER — ENOXAPARIN SODIUM 60 MG/0.6ML ~~LOC~~ SOLN
0.50 | SUBCUTANEOUS | Status: DC
Start: 2019-12-25 — End: 2019-12-25

## 2019-12-25 MED ORDER — OXYCODONE HCL 5 MG PO TABS
5.00 | ORAL_TABLET | ORAL | Status: DC
Start: ? — End: 2019-12-25

## 2019-12-25 MED ORDER — DSS 100 MG PO CAPS
100.00 | ORAL_CAPSULE | ORAL | Status: DC
Start: 2020-01-02 — End: 2019-12-25

## 2019-12-25 MED ORDER — CHOLECALCIFEROL 25 MCG (1000 UT) PO TABS
2000.00 | ORAL_TABLET | ORAL | Status: DC
Start: 2020-01-03 — End: 2019-12-25

## 2019-12-25 MED ORDER — DICYCLOMINE HCL 10 MG PO CAPS
10.00 | ORAL_CAPSULE | ORAL | Status: DC
Start: ? — End: 2019-12-25

## 2019-12-25 MED ORDER — TAMSULOSIN HCL 0.4 MG PO CAPS
0.40 | ORAL_CAPSULE | ORAL | Status: DC
Start: 2020-01-02 — End: 2019-12-25

## 2019-12-25 MED ORDER — LINACLOTIDE 72 MCG PO CAPS
72.00 | ORAL_CAPSULE | ORAL | Status: DC
Start: ? — End: 2019-12-25

## 2019-12-25 MED ORDER — KETOROLAC TROMETHAMINE 15 MG/ML IJ SOLN
15.00 | INTRAMUSCULAR | Status: DC
Start: ? — End: 2019-12-25

## 2019-12-25 MED ORDER — CLONIDINE HCL 0.1 MG PO TABS
0.10 | ORAL_TABLET | ORAL | Status: DC
Start: 2020-01-02 — End: 2019-12-25

## 2019-12-25 MED ORDER — DEXAMETHASONE 4 MG PO TABS
6.00 | ORAL_TABLET | ORAL | Status: DC
Start: 2019-12-26 — End: 2019-12-25

## 2019-12-25 MED ORDER — FUROSEMIDE 20 MG PO TABS
20.00 | ORAL_TABLET | ORAL | Status: DC
Start: ? — End: 2019-12-25

## 2019-12-25 NOTE — Telephone Encounter (Signed)
Phone call placed to speak with patient. Number is incorrect

## 2019-12-27 MED ORDER — GENERIC EXTERNAL MEDICATION
Status: DC
Start: ? — End: 2019-12-27

## 2019-12-27 MED ORDER — OXYCODONE HCL 10 MG PO TABS
10.00 | ORAL_TABLET | ORAL | Status: DC
Start: ? — End: 2019-12-27

## 2019-12-27 MED ORDER — ENOXAPARIN SODIUM 40 MG/0.4ML ~~LOC~~ SOLN
40.00 | SUBCUTANEOUS | Status: DC
Start: 2020-01-03 — End: 2019-12-27

## 2019-12-27 MED ORDER — MELATONIN 3 MG PO TABS
3.00 | ORAL_TABLET | ORAL | Status: DC
Start: 2020-01-02 — End: 2019-12-27

## 2019-12-27 MED ORDER — SODIUM CHLORIDE 0.9 % IV SOLN
10.00 | INTRAVENOUS | Status: DC
Start: ? — End: 2019-12-27

## 2019-12-29 MED ORDER — GENERIC EXTERNAL MEDICATION
Status: DC
Start: ? — End: 2019-12-29

## 2019-12-29 MED ORDER — METHOCARBAMOL 500 MG PO TABS
500.00 | ORAL_TABLET | ORAL | Status: DC
Start: 2020-01-02 — End: 2019-12-29

## 2019-12-29 MED ORDER — DICLOFENAC SODIUM 1 % EX GEL
2.00 | CUTANEOUS | Status: DC
Start: ? — End: 2019-12-29

## 2019-12-29 MED ORDER — POLYVINYL ALCOHOL 1.4 % OP SOLN
1.00 | OPHTHALMIC | Status: DC
Start: ? — End: 2019-12-29

## 2019-12-30 ENCOUNTER — Telehealth: Payer: Self-pay

## 2019-12-30 NOTE — Telephone Encounter (Signed)
VM left for daughter to offer to schedule visit with Palliative Care

## 2019-12-30 NOTE — Telephone Encounter (Signed)
TC placed to patient's son to attempt to schedule visit. Did not leave message as the VM greeting did not confirm that it was the correct number.

## 2019-12-31 ENCOUNTER — Telehealth: Payer: Self-pay

## 2019-12-31 MED ORDER — GENERIC EXTERNAL MEDICATION
Status: DC
Start: ? — End: 2019-12-31

## 2019-12-31 MED ORDER — KETOROLAC TROMETHAMINE 15 MG/ML IJ SOLN
15.00 | INTRAMUSCULAR | Status: DC
Start: ? — End: 2019-12-31

## 2019-12-31 NOTE — Telephone Encounter (Signed)
Phone call placed to PCP office to verify patient's phone number. Nurse shared that updated number [phone]. Nurse also shared that patient is in the hospital at [facility].

## 2019-12-31 NOTE — Telephone Encounter (Signed)
Hospital liaisons and Palliative NP made aware that patient is currently in the hospital @ [facility]

## 2019-12-31 NOTE — Telephone Encounter (Signed)
Phone number is incorrect

## 2020-01-02 MED ORDER — OXYCODONE HCL 15 MG PO TABS
15.00 | ORAL_TABLET | ORAL | Status: DC
Start: ? — End: 2020-01-02

## 2020-01-02 MED ORDER — ACETAMINOPHEN 500 MG PO TABS
1000.00 | ORAL_TABLET | ORAL | Status: DC
Start: 2020-01-02 — End: 2020-01-02

## 2020-01-02 MED ORDER — GENERIC EXTERNAL MEDICATION
Status: DC
Start: ? — End: 2020-01-02

## 2020-04-03 IMAGING — CT CT CERVICAL SPINE W/O CM
3 of 8 series · 12 of 33 positions shown, 14 images · non-contrast
Comparison: August 28, 2019.

CLINICAL DATA: Posttraumatic headache after fall.

EXAM:
CT HEAD WITHOUT CONTRAST
CT CERVICAL SPINE WITHOUT CONTRAST
TECHNIQUE: Multidetector CT imaging of the head and cervical spine was
performed following the standard protocol without intravenous
contrast. Multiplanar CT image reconstructions of the cervical spine
were also generated.

[Series 10: c_spine 2.0 sag bone · sagittal · 0.33mm/px · 5 of 61 slices shown]
[im 11/61  bone]
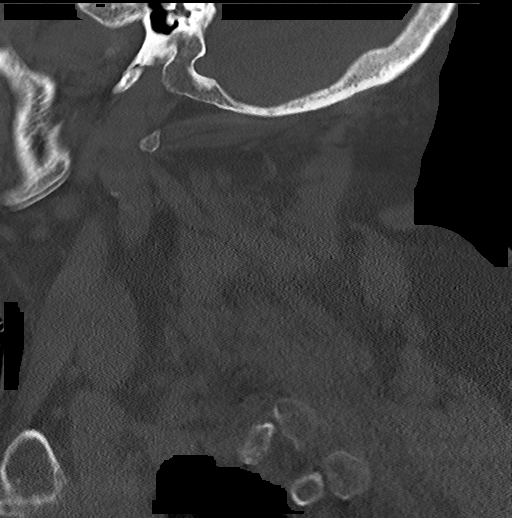
[im 21/61  bone]
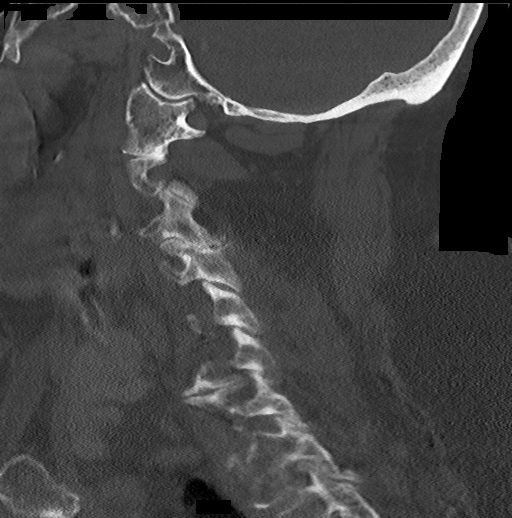
[im 31/61  bone]
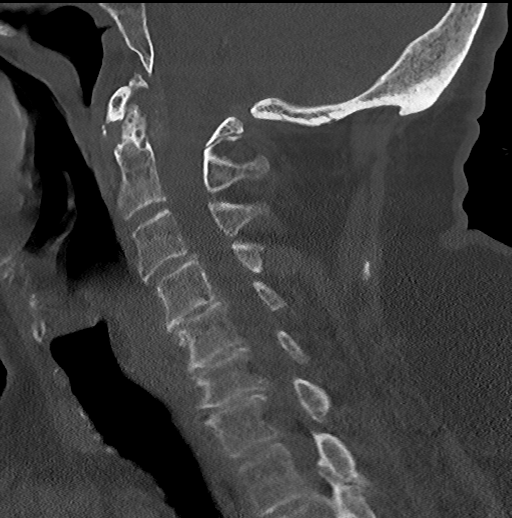
[im 41/61  bone]
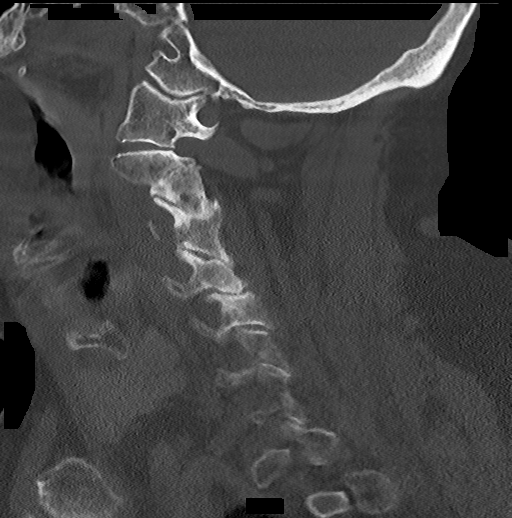
[im 51/61  bone]
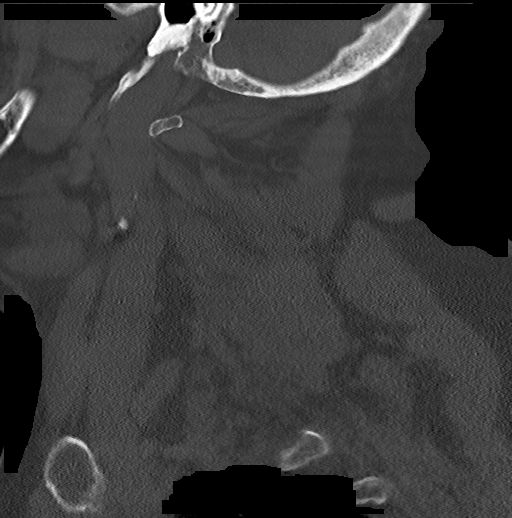

[Series 11: c_spine 2.0 cor bone · coronal · 0.25mm/px · 1 of 61 slices shown]
[im 31/61  bone]
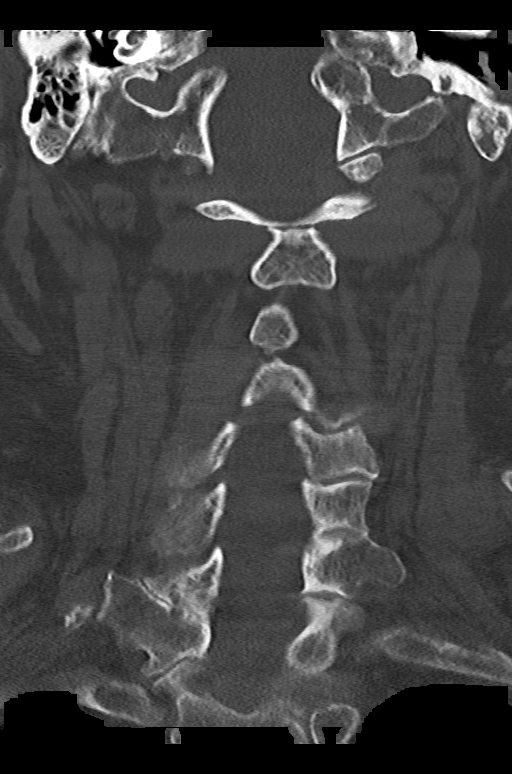

[Series 16: c_spine 1.0 st thins · axial · 0.37mm/px · z∈[-238,-116]mm · 6 of 246 slices shown, 8 images]
[im 36/246  soft-tissue]
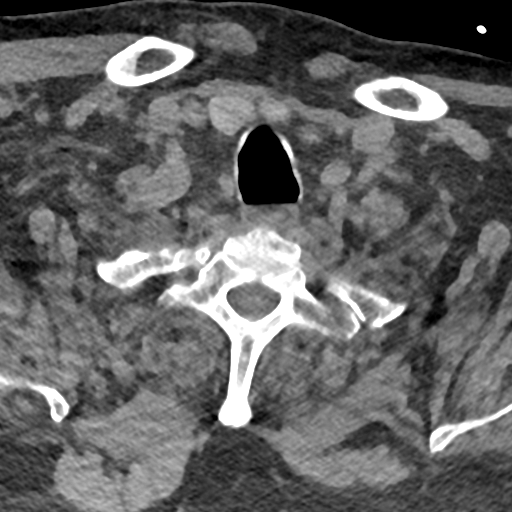
[im 36/246  bone]
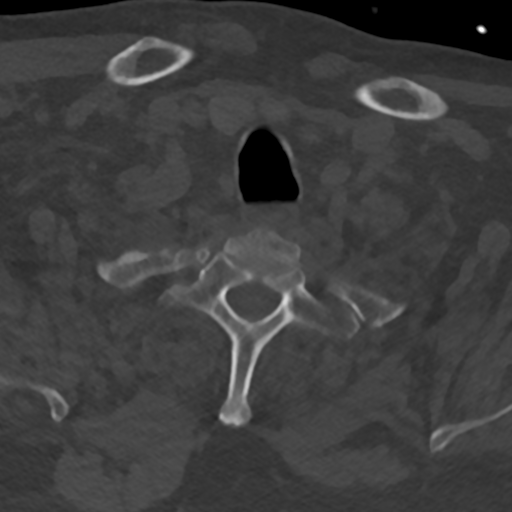
[im 71/246  bone]
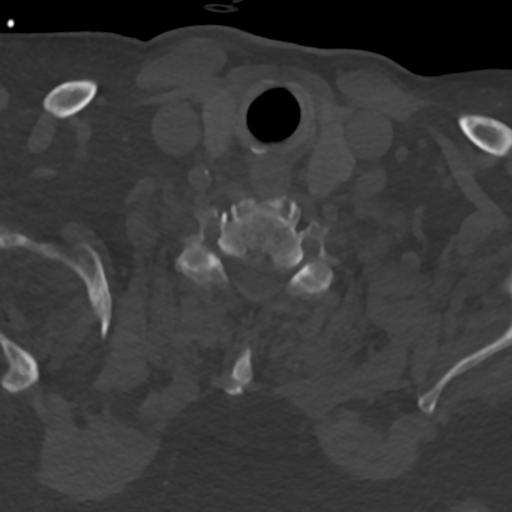
[im 106/246  bone]
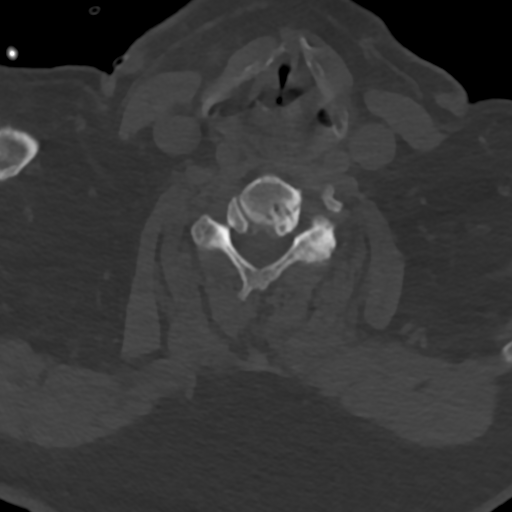
[im 141/246  bone]
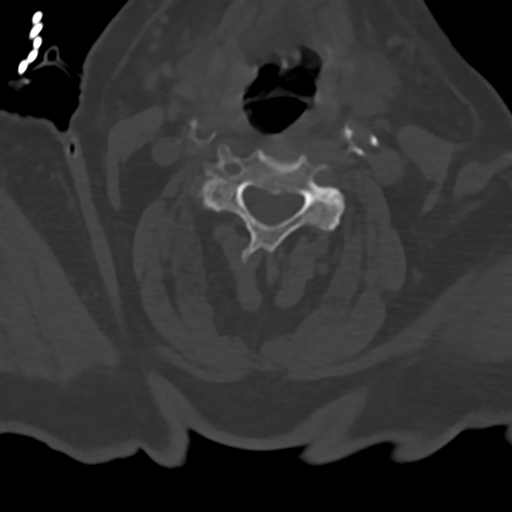
[im 176/246  soft-tissue]
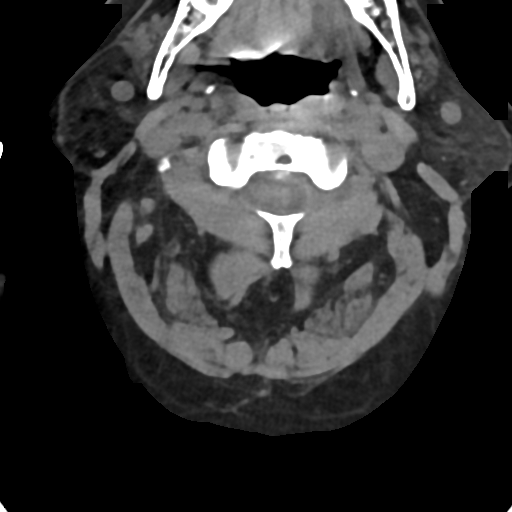
[im 176/246  bone]
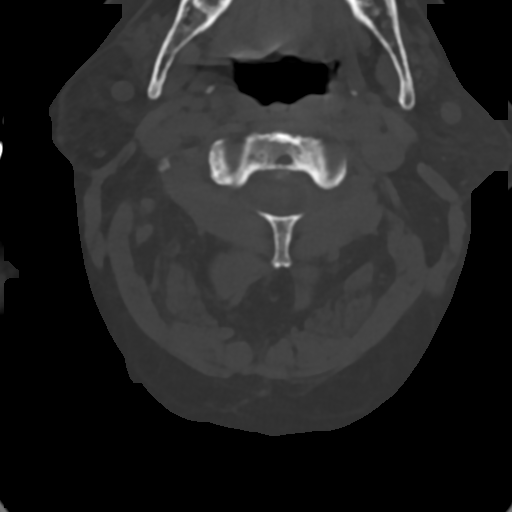
[im 211/246  bone]
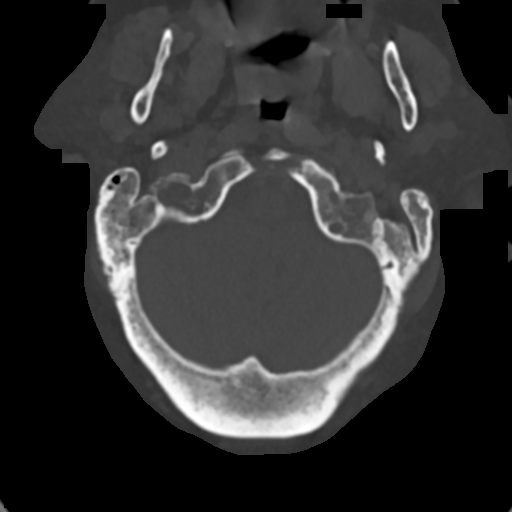

[12 of 33 positions shown; findings below may reference images not displayed]

FINDINGS: CT HEAD FINDINGS

Brain: Mild chronic ischemic white matter disease is noted. Mild
diffuse cortical atrophy is noted. No mass effect or midline shift
is noted. Ventricular size is within normal limits. There is no
evidence of mass lesion, hemorrhage or acute infarction.

Vascular: No hyperdense vessel or unexpected calcification.

Skull: Normal. Negative for fracture or focal lesion.

Sinuses/Orbits: Mild left sphenoid sinusitis is noted.

Other: None.

CT CERVICAL SPINE FINDINGS

Alignment: Mild grade 1 anterolisthesis of C3-4 and C4-5 secondary
to posterior facet joint hypertrophy.

Skull base and vertebrae: No acute fracture. No primary bone lesion
or focal pathologic process.

Soft tissues and spinal canal: No prevertebral fluid or swelling. No
visible canal hematoma.

Disc levels: Severe degenerative disc disease is noted at C4-5, C5-6
and C6-7 with anterior posterior osteophyte formation.

Upper chest: Negative.

Other: Degenerative changes are seen involving posterior facet
joints bilaterally.
IMPRESSION: Mild chronic ischemic white matter disease. Mild diffuse cortical
atrophy. No acute intracranial abnormality seen.

Severe multilevel degenerative disc disease. No acute abnormality
seen in the cervical spine.

## 2020-04-03 IMAGING — CR DG KNEE COMPLETE 4+V*R*
4 series · 4 of 4 positions shown · non-contrast
Comparison: None.

CLINICAL DATA: Knee pain post fall.

EXAM:
RIGHT KNEE - COMPLETE 4+ VIEW

[knee ap]
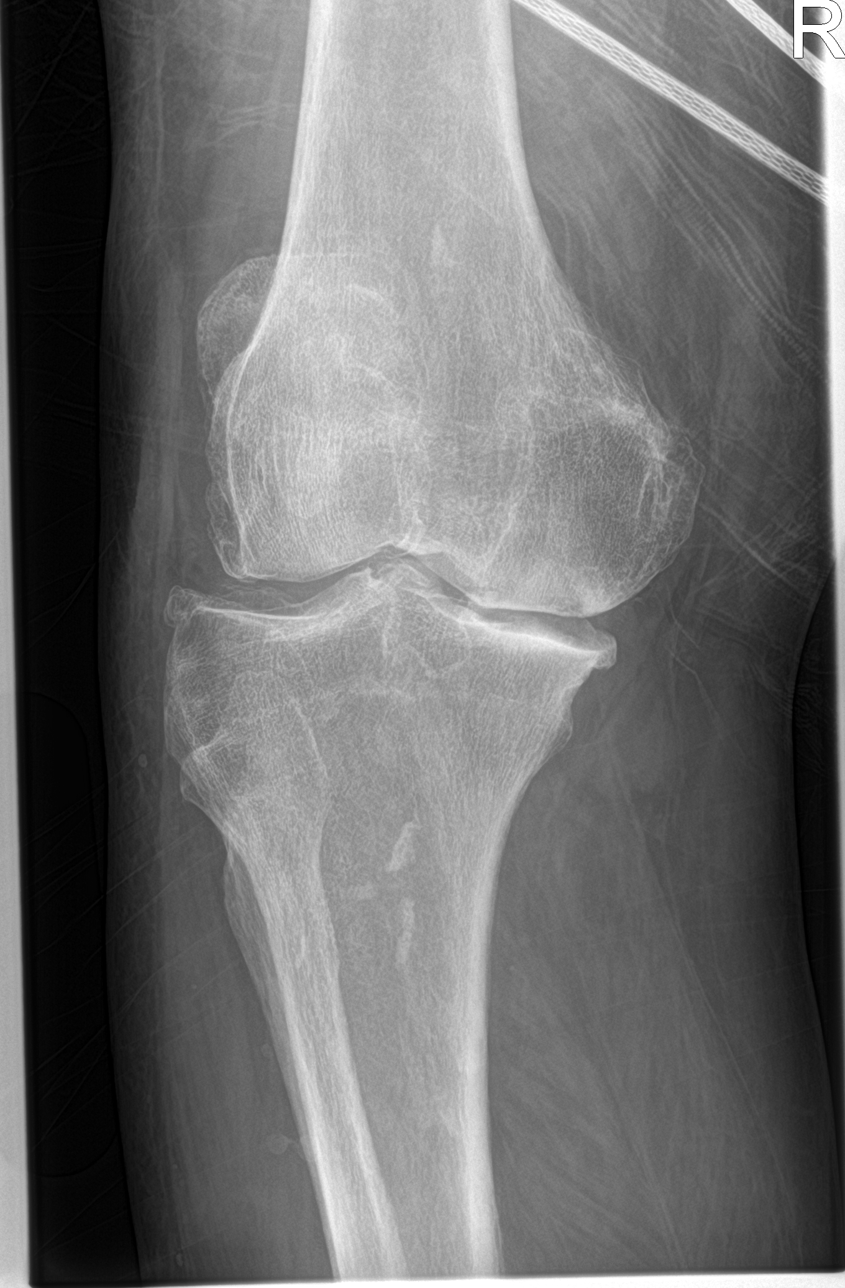

[knee lat]
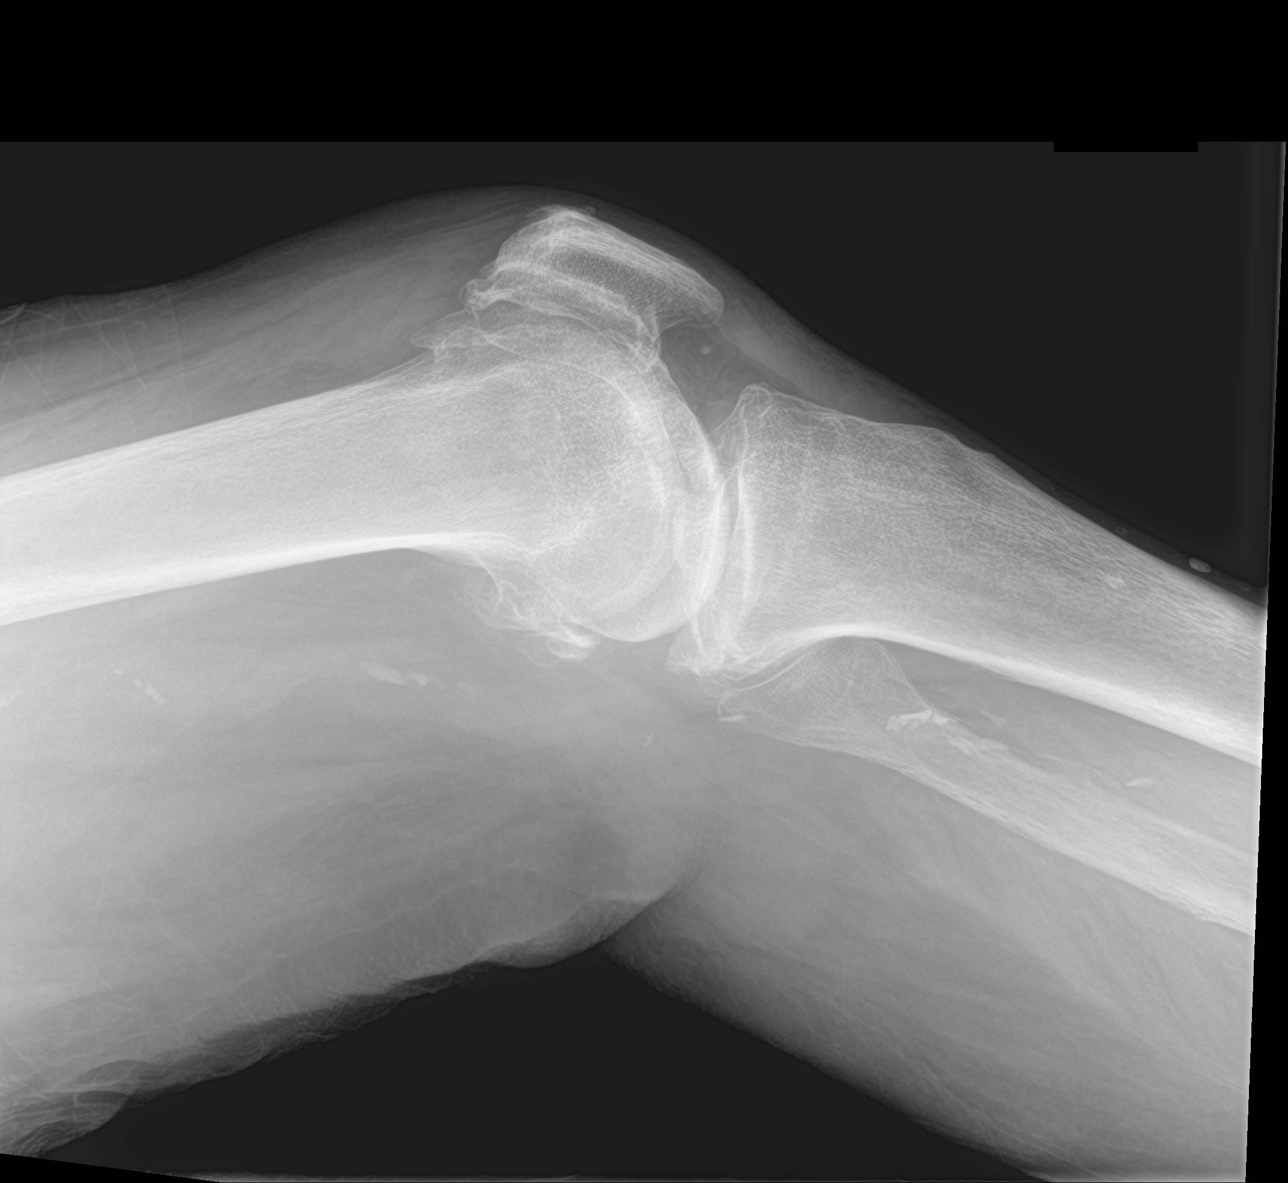

[knee obl (1 of 2)]
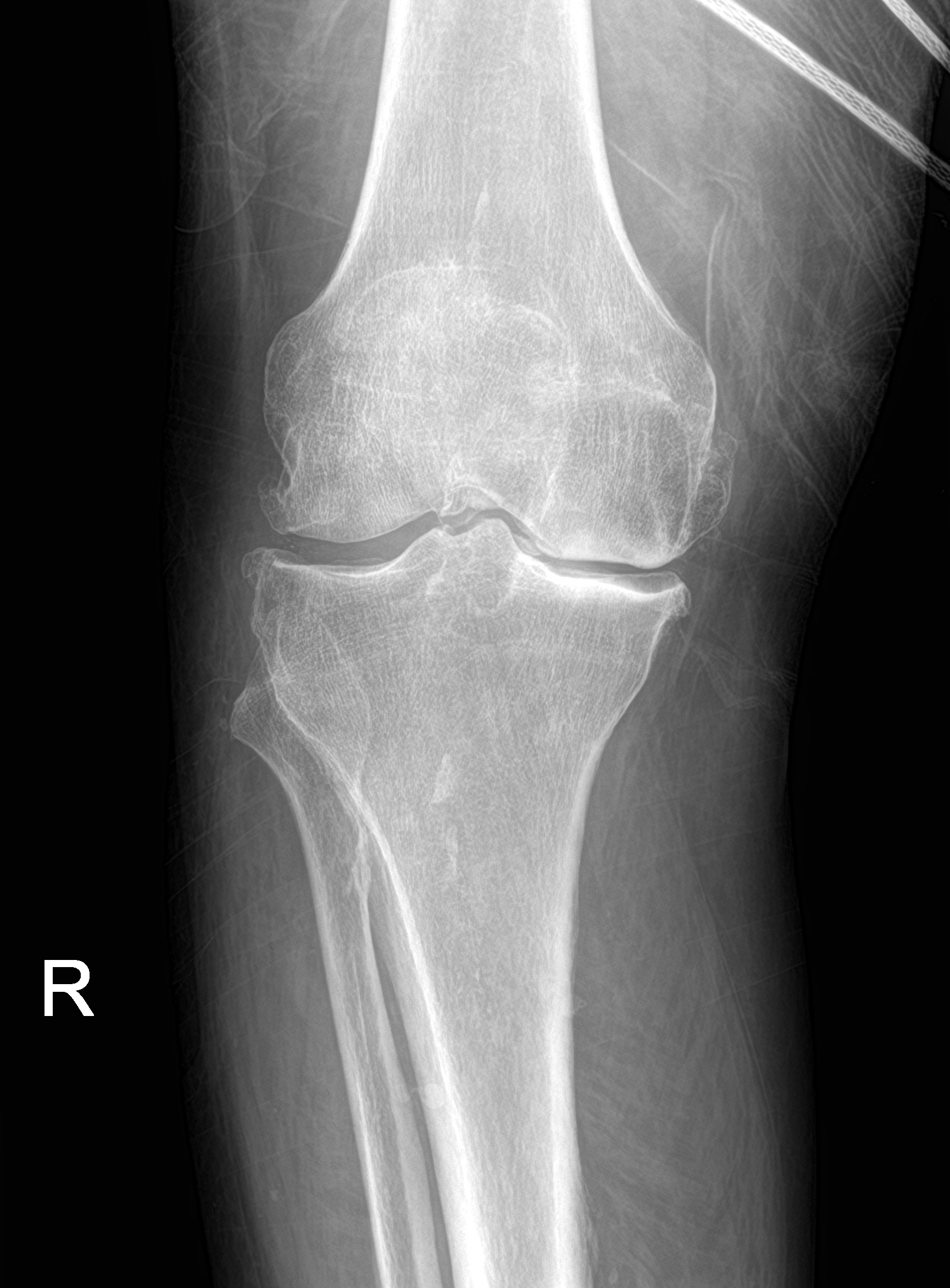

[knee obl (2 of 2)]
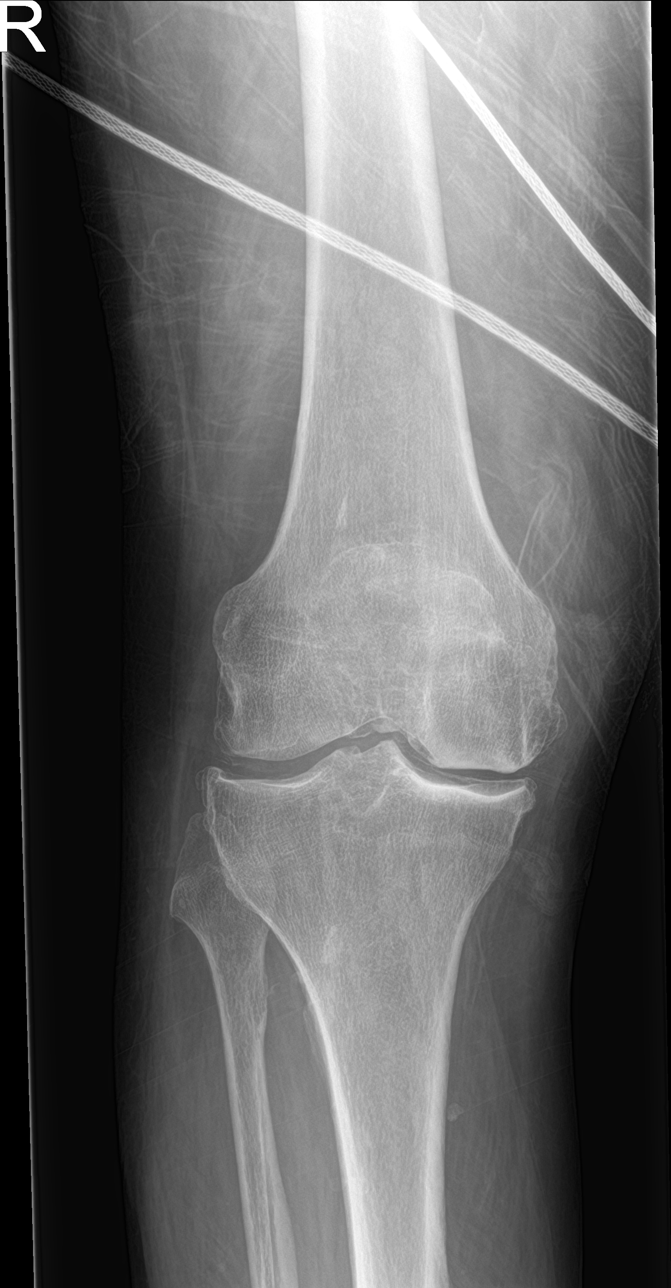

[4 of 4 positions shown; findings below may reference images not displayed]

FINDINGS: No evidence of fracture, dislocation, or joint effusion. Moderate to
severe 3 compartment osteoarthritic changes of the right knee.
Prepatellar soft tissue swelling, mild.
IMPRESSION: 1. No acute fracture or dislocation identified about the right knee.
2. Moderate to severe 3 compartment osteoarthritic changes of the
right knee.

## 2020-06-09 ENCOUNTER — Emergency Department (HOSPITAL_COMMUNITY)
Admission: EM | Admit: 2020-06-09 | Discharge: 2020-06-10 | Disposition: A | Discharge status: Home / Self Care | Payer: Medicare Other | Attending: Emergency Medicine | Admitting: Emergency Medicine

## 2020-06-09 ENCOUNTER — Other Ambulatory Visit: Payer: Self-pay

## 2020-06-09 ENCOUNTER — Encounter (HOSPITAL_COMMUNITY): Payer: Self-pay

## 2020-06-09 DIAGNOSIS — M25561 Pain in right knee: Secondary | ICD-10-CM | POA: Insufficient documentation

## 2020-06-09 DIAGNOSIS — I2510 Atherosclerotic heart disease of native coronary artery without angina pectoris: Secondary | ICD-10-CM | POA: Insufficient documentation

## 2020-06-09 DIAGNOSIS — Z87891 Personal history of nicotine dependence: Secondary | ICD-10-CM | POA: Diagnosis not present

## 2020-06-09 DIAGNOSIS — M7918 Myalgia, other site: Secondary | ICD-10-CM | POA: Diagnosis present

## 2020-06-09 DIAGNOSIS — Z79899 Other long term (current) drug therapy: Secondary | ICD-10-CM | POA: Diagnosis not present

## 2020-06-09 DIAGNOSIS — G8929 Other chronic pain: Secondary | ICD-10-CM | POA: Diagnosis not present

## 2020-06-09 DIAGNOSIS — I10 Essential (primary) hypertension: Secondary | ICD-10-CM | POA: Diagnosis not present

## 2020-06-09 DIAGNOSIS — E119 Type 2 diabetes mellitus without complications: Secondary | ICD-10-CM | POA: Insufficient documentation

## 2020-06-09 DIAGNOSIS — G4733 Obstructive sleep apnea (adult) (pediatric): Secondary | ICD-10-CM | POA: Insufficient documentation

## 2020-06-09 DIAGNOSIS — Z8249 Family history of ischemic heart disease and other diseases of the circulatory system: Secondary | ICD-10-CM | POA: Diagnosis not present

## 2020-06-09 DIAGNOSIS — M25562 Pain in left knee: Secondary | ICD-10-CM | POA: Diagnosis not present

## 2020-06-09 LAB — CBC
HCT: 33.3 % — ABNORMAL LOW (ref 39.0–52.0)
Hemoglobin: 10.7 g/dL — ABNORMAL LOW (ref 13.0–17.0)
MCH: 29.2 pg (ref 26.0–34.0)
MCHC: 32.1 g/dL (ref 30.0–36.0)
MCV: 91 fL (ref 80.0–100.0)
Platelets: 353 10*3/uL (ref 150–400)
RBC: 3.66 MIL/uL — ABNORMAL LOW (ref 4.22–5.81)
RDW: 14.3 % (ref 11.5–15.5)
WBC: 11.8 10*3/uL — ABNORMAL HIGH (ref 4.0–10.5)
nRBC: 0 % (ref 0.0–0.2)

## 2020-06-09 LAB — COMPREHENSIVE METABOLIC PANEL
ALT: 16 U/L (ref 0–44)
AST: 19 U/L (ref 15–41)
Albumin: 3.9 g/dL (ref 3.5–5.0)
Alkaline Phosphatase: 58 U/L (ref 38–126)
Anion gap: 8 (ref 5–15)
BUN: 24 mg/dL — ABNORMAL HIGH (ref 8–23)
CO2: 27 mmol/L (ref 22–32)
Calcium: 9.2 mg/dL (ref 8.9–10.3)
Chloride: 97 mmol/L — ABNORMAL LOW (ref 98–111)
Creatinine, Ser: 0.77 mg/dL (ref 0.61–1.24)
GFR calc Af Amer: >60 mL/min (ref >60)
GFR calc non Af Amer: >60 mL/min (ref >60)
Glucose, Bld: 108 mg/dL — ABNORMAL HIGH (ref 70–99)
Potassium: 5.4 mmol/L — ABNORMAL HIGH (ref 3.5–5.1)
Sodium: 132 mmol/L — ABNORMAL LOW (ref 135–145)
Total Bilirubin: 0.3 mg/dL (ref 0.3–1.2)
Total Protein: 6.6 g/dL (ref 6.5–8.1)

## 2020-06-09 LAB — SALICYLATE LEVEL: Salicylate Lvl: <7 mg/dL — ABNORMAL LOW (ref 7.0–30.0)

## 2020-06-09 LAB — LIPASE, BLOOD: Lipase: 42 U/L (ref 11–51)

## 2020-06-09 LAB — PROTIME-INR
INR: 0.8 (ref 0.8–1.2)
Prothrombin Time: 11.2 seconds — ABNORMAL LOW (ref 11.4–15.2)

## 2020-06-09 LAB — ACETAMINOPHEN LEVEL: Acetaminophen (Tylenol), Serum: <10 ug/mL — ABNORMAL LOW (ref 10–30)

## 2020-06-09 MED ORDER — OXYCODONE HCL 5 MG PO TABS: 5.0000 mg | ORAL_TABLET | ORAL | 0 refills | Status: DC | PRN

## 2020-06-09 MED ORDER — OXYCODONE HCL 5 MG PO TABS
5.0000 mg | ORAL_TABLET | Freq: Once | ORAL | Status: AC
  Administered 2020-06-09: 5 mg via ORAL
  Filled 2020-06-09: qty 1

## 2020-06-09 MED ORDER — OXYCODONE HCL 5 MG PO TABS: 5.0000 mg | ORAL_TABLET | Freq: Four times a day (QID) | ORAL | 0 refills | Status: DC | PRN

## 2020-06-09 MED ORDER — OXYCODONE HCL 5 MG PO TABS
5.0000 mg | ORAL_TABLET | Freq: Once | ORAL | Status: AC | PRN
  Administered 2020-06-09: 5 mg via ORAL
  Filled 2020-06-09: qty 1

## 2020-06-09 NOTE — ED Provider Notes (Signed)
[facility] DEPT Provider Note   CSN: [id number] Arrival date & time: 06/09/20  2138     History No chief complaint on file.   Jorge Sherman is a 79 y.o. male.  The patient's main concern is chronic pain, he was told to start taking Suboxone but he cannot afford the prescription.  He has had multiple falls but none recently.  He comments on chronic bilateral knee pain.  He says he has been taking a lot of acetaminophen, he cannot quantify it for [place].  He is concerned he could have aggravated his liver.  No fevers chills.  No other concerns.  No recent falls.        Past Medical History:  Diagnosis Date  . Chronic pain   . Depressive disorder, not elsewhere classified   . Dizziness and giddiness   . Essential and other specified forms of tremor   . Headache(784.0)   . Leukocytosis, unspecified   . Obstructive sleep apnea (adult) (pediatric)   . Pain in joint, lower leg   . Septic joint of left knee joint ([place]) 04/22/2019  . Spinal stenosis, lumbar region, without neurogenic claudication   . Thoracic or lumbosacral neuritis or radiculitis, unspecified   . Type II or unspecified type diabetes mellitus without mention of complication, not stated as uncontrolled   . Unspecified cardiovascular disease   . Unspecified essential hypertension     Patient Active Problem List   Diagnosis Date Noted  . Altered mental status 08/28/2019  . Septic joint of left knee joint ([place]) 04/22/2019  . Erectile dysfunction 10/30/2013  . Essential hypertension, benign 10/30/2013  . Coronary atherosclerosis of native coronary artery 10/30/2013  . Dizziness and giddiness   . Pain in joint, lower leg   . Leukocytosis, unspecified   . Type II or unspecified type diabetes mellitus without mention of complication, not stated as uncontrolled   . Obstructive sleep apnea   . Depressive disorder, not elsewhere classified   . Essential and other specified forms of tremor     . Headache(784.0)   . Spinal stenosis, lumbar region, without neurogenic claudication   . Thoracic or lumbosacral neuritis or radiculitis, unspecified   . Chronic pain     Past Surgical History:  Procedure Laterality Date  . BACK SURGERY    . back surgey     times 3       Family History  Problem Relation Age of Onset  . Cancer Father   . Heart disease Father   . Stroke Mother   . Diabetes Mother     Social History   Tobacco Use  . Smoking status: Former Research scientist (life sciences)  . Smokeless tobacco: Never Used  Substance Use Topics  . Alcohol use: No  . Drug use: No    Home Medications Prior to Admission medications   Medication Sig Start Date End Date Taking? Authorizing Provider  amLODipine (NORVASC) 2.5 MG tablet Take 2.5 mg by mouth daily.    [provider]  buprenorphine-naloxone (SUBOXONE) 8-2 mg SUBL SL tablet Place 0.5 tablets under the tongue 2 (two) times daily.    [provider]  celecoxib (CELEBREX) 200 MG capsule Take 200 mg by mouth 2 (two) times daily.    [provider]  cloNIDine (CATAPRES) 0.1 MG tablet Take 0.1 mg by mouth 2 (two) times daily.     [provider]  DULoxetine (CYMBALTA) 30 MG capsule Take 30 mg by mouth 3 (three) times daily.  [provider]  ferrous sulfate 325 (65 FE) MG tablet Take 325 mg by mouth daily.     [provider]  gabapentin (NEURONTIN) 300 MG capsule Take 2 capsules (600 mg total) by mouth 3 (three) times daily. 08/30/19   Nita Sells, MD  hydroxychloroquine (PLAQUENIL) 200 MG tablet Take 200 mg by mouth 2 (two) times daily.    [provider]  Iron-FA-B Cmp-C-Biot-Probiotic (FUSION PLUS PO) Take 1 capsule by mouth daily.    [provider]  linaclotide (LINZESS) 72 MCG capsule Take 72 mcg by mouth daily before breakfast.    [provider]  LORazepam (ATIVAN) 1 MG tablet Take 2 mg by mouth every 6 (six) hours as needed for anxiety.      [provider]  omeprazole (PRILOSEC) 40 MG capsule Take 40 mg by mouth daily.    [provider]  oxyCODONE (ROXICODONE) 5 MG immediate release tablet Take 1 tablet (5 mg total) by mouth every 6 (six) hours as needed for up to 12 doses for severe pain. 06/09/20   Breck Coons, MD  rosuvastatin (CRESTOR) 40 MG tablet Take 40 mg by mouth every evening.     [provider]  tamsulosin (FLOMAX) 0.4 MG CAPS capsule Take 0.4 mg by mouth daily.    [provider]    Allergies    Cefepime, Iohexol, Fentanyl, and Iodine  Review of Systems   Review of Systems  Constitutional: Negative for chills and fever.  HENT: Negative for congestion and rhinorrhea.   Respiratory: Negative for cough and shortness of breath.   Cardiovascular: Negative for chest pain and palpitations.  Gastrointestinal: Negative for diarrhea, nausea and vomiting.  Genitourinary: Negative for difficulty urinating and dysuria.  Musculoskeletal: Positive for arthralgias (chronic) and gait problem (chronic). Negative for back pain.  Skin: Negative for color change and rash.  Neurological: Negative for light-headedness and headaches.    Physical Exam Updated Vital Signs BP (!) 146/123   Pulse 83   Temp 98.9 F (37.2 C) (Oral)   Resp 20   Ht 5\' 10"  (1.778 m)   Wt 130 kg   SpO2 99%   BMI 41.12 kg/m   Physical Exam Vitals and nursing note reviewed. Exam conducted with a chaperone present.  Constitutional:      General: He is not in acute distress.    Appearance: Normal appearance.  HENT:     Head: Normocephalic and atraumatic.     Nose: No rhinorrhea.  Eyes:     General:        Right eye: No discharge.        Left eye: No discharge.     Conjunctiva/sclera: Conjunctivae normal.  Cardiovascular:     Rate and Rhythm: Normal rate and regular rhythm.  Pulmonary:     Effort: Pulmonary effort is normal.     Breath sounds: No stridor.  Abdominal:     General: Abdomen is flat. There  is no distension.     Palpations: Abdomen is soft.  Musculoskeletal:        General: No deformity or signs of injury.     Comments: Small abrasion over the left patella, no focal tenderness, no deformity, pulses distal to this are intact bilaterally sensation intact bilaterally motor function intact bilaterally  Skin:    General: Skin is warm and dry.  Neurological:     General: No focal deficit present.     Mental Status: He is alert. Mental status is at  baseline.     Motor: No weakness.  Psychiatric:        Mood and Affect: Mood normal.        Behavior: Behavior normal.        Thought Content: Thought content normal.     ED Results / Procedures / Treatments   Labs (all labs ordered are listed, but only abnormal results are displayed) Labs Reviewed  CBC - Abnormal; Notable for the following components:      Result Value   WBC 11.8 (*)    RBC 3.66 (*)    Hemoglobin 10.7 (*)    HCT 33.3 (*)    All other components within normal limits  COMPREHENSIVE METABOLIC PANEL - Abnormal; Notable for the following components:   Sodium 132 (*)    Potassium 5.4 (*)    Chloride 97 (*)    Glucose, Bld 108 (*)    BUN 24 (*)    All other components within normal limits  PROTIME-INR - Abnormal; Notable for the following components:   Prothrombin Time 11.2 (*)    All other components within normal limits  ACETAMINOPHEN LEVEL - Abnormal; Notable for the following components:   Acetaminophen (Tylenol), Serum <10 (*)    All other components within normal limits  SALICYLATE LEVEL - Abnormal; Notable for the following components:   Salicylate Lvl <6.5 (*)    All other components within normal limits  LIPASE, BLOOD    EKG None  Radiology No results found.  Procedures Procedures (including critical care time)  Medications Ordered in ED Medications  oxyCODONE (Oxy IR/ROXICODONE) immediate release tablet 5 mg (5 mg Oral Given 06/09/20 2254)    ED Course  I have reviewed the triage  vital signs and the nursing notes.  Pertinent labs & imaging results that were available during my care of the patient were reviewed by me and considered in my medical decision making (see chart for details).    MDM Rules/Calculators/A&P                          Worsening of chronic pain, cannot afford pain medications.  No new injury.  Concerned about excessive Tylenol use.  Labs are obtained.  Patient is well-appearing with normal vital signs.  Oxycodone was given for pain control. No imaging needed today.  Patient's pain is controlled better with Roxicodone oral.  He is given a short prescription told to follow-up with his primary care provider for his chronic pain provider for a better choice than Suboxone if that is too expensive for him.  His laboratory analysis is reviewed by myself and shows no liver dysfunction no acetaminophen in his system, no renal dysfunction.  He is safe for discharge home.  Strict return precautions are given.  Vital signs stable time discharge.   Final Clinical Impression(s) / ED Diagnoses Final diagnoses:  Musculoskeletal pain    Rx / DC Orders ED Discharge Orders         Ordered    oxyCODONE (ROXICODONE) 5 MG immediate release tablet  Every 4 hours PRN,   Status:  Discontinued     Reprint     06/09/20 2311    oxyCODONE (ROXICODONE) 5 MG immediate release tablet  Every 6 hours PRN     Discontinue  Reprint     06/09/20 2312           Breck Coons, MD 06/09/20 2314

## 2020-06-09 NOTE — ED Triage Notes (Signed)
Fall 6 weeks ago fall backewards and broke bilateral shoulders. Generalized back pain, bilateral knee pain and shortness of breath. Pt sts he has been taking > 3000mg  tylenol a day due to rx being too expensive.

## 2020-06-09 NOTE — ED Notes (Signed)
[facility] called for transport.  

## 2020-06-10 NOTE — ED Notes (Signed)
Signature pad not picking up; pt verbalized understanding of discharge instructions

## 2020-06-24 ENCOUNTER — Other Ambulatory Visit: Payer: Self-pay

## 2020-06-24 ENCOUNTER — Encounter (HOSPITAL_BASED_OUTPATIENT_CLINIC_OR_DEPARTMENT_OTHER): Payer: Self-pay | Admitting: Emergency Medicine

## 2020-06-24 ENCOUNTER — Emergency Department (HOSPITAL_BASED_OUTPATIENT_CLINIC_OR_DEPARTMENT_OTHER)
Admission: EM | Admit: 2020-06-24 | Discharge: 2020-06-24 | Disposition: A | Discharge status: Home / Self Care | Payer: Medicare Other | Source: Home / Self Care | Attending: Emergency Medicine | Admitting: Emergency Medicine

## 2020-06-24 DIAGNOSIS — I10 Essential (primary) hypertension: Secondary | ICD-10-CM | POA: Insufficient documentation

## 2020-06-24 DIAGNOSIS — Z87891 Personal history of nicotine dependence: Secondary | ICD-10-CM | POA: Insufficient documentation

## 2020-06-24 DIAGNOSIS — G894 Chronic pain syndrome: Secondary | ICD-10-CM

## 2020-06-24 DIAGNOSIS — Z79899 Other long term (current) drug therapy: Secondary | ICD-10-CM | POA: Insufficient documentation

## 2020-06-24 DIAGNOSIS — M25562 Pain in left knee: Secondary | ICD-10-CM | POA: Insufficient documentation

## 2020-06-24 DIAGNOSIS — E119 Type 2 diabetes mellitus without complications: Secondary | ICD-10-CM | POA: Insufficient documentation

## 2020-06-24 DIAGNOSIS — M25511 Pain in right shoulder: Secondary | ICD-10-CM | POA: Insufficient documentation

## 2020-06-24 DIAGNOSIS — M25512 Pain in left shoulder: Secondary | ICD-10-CM | POA: Insufficient documentation

## 2020-06-24 DIAGNOSIS — N179 Acute kidney failure, unspecified: Secondary | ICD-10-CM | POA: Diagnosis not present

## 2020-06-24 DIAGNOSIS — M25561 Pain in right knee: Secondary | ICD-10-CM | POA: Insufficient documentation

## 2020-06-24 MED ORDER — KETOROLAC TROMETHAMINE 60 MG/2ML IM SOLN
30.0000 mg | Freq: Once | INTRAMUSCULAR | Status: AC
  Administered 2020-06-24: 30 mg via INTRAMUSCULAR
  Filled 2020-06-24: qty 2

## 2020-06-24 NOTE — ED Triage Notes (Signed)
Pt has chronic shoulder, back, knee pain.  Pt has a doctor for pain management but pt refused to take suboxone.  Pt requesting pain medications.

## 2020-06-24 NOTE — ED Provider Notes (Signed)
[place] EMERGENCY DEPARTMENT Provider Note  CSN: [id number] Arrival date & time: 06/24/20 1359    History Chief Complaint  Patient presents with  . Pain    HPI  Jorge Sherman is a 79 y.o. male with history of chronic pain, stopped going to Pain Management because he could not afford Suboxone they recommended. He has not been back since then. He reports pain in both shoulders, both knees and lower back. He has been in the ED several times recently for same, given Rx for short term oxycodone on 7.20, filled on 7.27 after a visit to [facility]. Labs done then were unremarkable. He was seen at the [facility] ED on 7.26 for same, given Toradol then, but no Rx for opiates. He has not seen pain management or PCP since then.    Past Medical History:  Diagnosis Date  . Chronic pain   . Depressive disorder, not elsewhere classified   . Dizziness and giddiness   . Essential and other specified forms of tremor   . Headache(784.0)   . Leukocytosis, unspecified   . Obstructive sleep apnea (adult) (pediatric)   . Pain in joint, lower leg   . Septic joint of left knee joint ([place]) 04/22/2019  . Spinal stenosis, lumbar region, without neurogenic claudication   . Thoracic or lumbosacral neuritis or radiculitis, unspecified   . Type II or unspecified type diabetes mellitus without mention of complication, not stated as uncontrolled   . Unspecified cardiovascular disease   . Unspecified essential hypertension     Past Surgical History:  Procedure Laterality Date  . BACK SURGERY    . back surgey     times 3    Family History  Problem Relation Age of Onset  . Cancer Father   . Heart disease Father   . Stroke Mother   . Diabetes Mother     Social History   Tobacco Use  . Smoking status: Former Research scientist (life sciences)  . Smokeless tobacco: Never Used  Substance Use Topics  . Alcohol use: No  . Drug use: No     Home Medications Prior to Admission medications   Medication Sig Start Date End  Date Taking? Authorizing Provider  Lidocaine-Menthol 3.6-1.25 % PTCH Place onto the skin. 01/02/20  Yes [provider]  amLODipine (NORVASC) 2.5 MG tablet Take 2.5 mg by mouth daily.    [provider]  Buprenorphine HCl-Naloxone HCl 8-2 MG FILM Place under the tongue 3 (three) times daily. 04/09/20   [provider]  celecoxib (CELEBREX) 200 MG capsule Take 200 mg by mouth 2 (two) times daily.    [provider]  cloNIDine (CATAPRES) 0.1 MG tablet Take 0.1 mg by mouth 2 (two) times daily.     [provider]  DULoxetine (CYMBALTA) 30 MG capsule Take 30 mg by mouth 3 (three) times daily.     [provider]  ferrous sulfate 325 (65 FE) MG tablet Take 325 mg by mouth daily.     [provider]  furosemide (LASIX) 20 MG tablet Take 20 mg by mouth daily as needed. 01/20/20   [provider]  gabapentin (NEURONTIN) 300 MG capsule Take 2 capsules (600 mg total) by mouth 3 (three) times daily. 08/30/19   Nita Sells, MD  gabapentin (NEURONTIN) 800 MG tablet Take 800 mg by mouth 3 (three) times daily. 06/10/20   [provider]  hydroxychloroquine (PLAQUENIL) 200 MG tablet Take 200 mg by mouth 2 (two) times daily.  [provider]  ipratropium-albuterol (DUONEB) 0.5-2.5 (3) MG/3ML SOLN SMARTSIG:1 Vial(s) Via Nebulizer PRN 02/13/20   [provider]  Iron-FA-B Cmp-C-Biot-Probiotic (FUSION PLUS PO) Take 1 capsule by mouth daily.    [provider]  ketorolac (TORADOL) 10 MG tablet Take 10 mg by mouth every 6 (six) hours as needed. 06/15/20   [provider]  linaclotide (LINZESS) 72 MCG capsule Take 72 mcg by mouth daily before breakfast.    [provider]  LORazepam (ATIVAN) 1 MG tablet Take 2 mg by mouth every 6 (six) hours as needed for anxiety.     [provider]  methocarbamol (ROBAXIN) 500 MG tablet Take 1,000 mg by mouth 3 (three) times daily. 06/15/20   [provider]  NARCAN 4 MG/0.1ML LIQD nasal spray kit SMARTSIG:1 Spray(s) Both Nares 01/27/20   [provider]  omeprazole (PRILOSEC) 40 MG capsule Take 40 mg by mouth daily.    [provider]  oxyCODONE (ROXICODONE) 5 MG immediate release tablet Take 1 tablet (5 mg total) by mouth every 6 (six) hours as needed for up to 12 doses for severe pain. 06/09/20   Breck Coons, MD  Oxycodone HCl 20 MG TABS Take 1 tablet by mouth every 6 (six) hours as needed. 01/24/20   [provider]  OXYCONTIN 20 MG 12 hr tablet SMARTSIG:1 Tablet(s) By Mouth Every 12 Hours 01/24/20   [provider]  rosuvastatin (CRESTOR) 40 MG tablet Take 40 mg by mouth every evening.     [provider]  tamsulosin (FLOMAX) 0.4 MG CAPS capsule Take 0.4 mg by mouth daily.    [provider]     Allergies    Cefepime, Iohexol, Fentanyl, and Iodine   Review of Systems   Review of Systems A comprehensive review of systems was completed and negative except as noted in HPI.    Physical Exam BP (!) 153/76 (BP Location: Right Arm)   Pulse 80   Temp 97.7 F (36.5 C) (Oral)   Resp 20   SpO2 98%   Physical Exam Vitals and nursing note reviewed.  Constitutional:      Appearance: Normal appearance.  HENT:     Head: Normocephalic and atraumatic.     Nose: Nose normal.     Mouth/Throat:     Mouth: Mucous membranes are moist.  Eyes:     Extraocular Movements: Extraocular movements intact.     Conjunctiva/sclera: Conjunctivae normal.  Cardiovascular:     Rate and Rhythm: Normal rate.  Pulmonary:     Effort: Pulmonary effort is normal.     Breath sounds: Normal breath sounds.  Abdominal:     General: Abdomen is flat.     Palpations: Abdomen is soft.     Tenderness: There is no abdominal tenderness.  Musculoskeletal:        General: No swelling.     Cervical back: Neck supple.     Comments: Pain to various joints, no acute injuries  Skin:    General: Skin is warm and  dry.  Neurological:     General: No focal deficit present.     Mental Status: He is alert.  Psychiatric:        Mood and Affect: Mood normal.      ED Results / Procedures / Treatments   Labs (all labs ordered are listed, but only abnormal results are displayed) Labs Reviewed - No data to display  EKG None   Radiology No results found.  Procedures Procedures  Medications Ordered in the ED Medications  ketorolac (TORADOL) injection 30 mg (30 mg Intramuscular Given 06/24/20 1430)     MDM Rules/Calculators/A&P MDM Patient again advised that the ED will not manage his chronic pain. Offered Toradol injection but no indication for opiates. Patient advised to followup with his PCP, previous Pain Management or establish with a new Pain Management at his preference.  ED Course  I have reviewed the triage vital signs and the nursing notes.  Pertinent labs & imaging results that were available during my care of the patient were reviewed by me and considered in my medical decision making (see chart for details).     Final Clinical Impression(s) / ED Diagnoses Final diagnoses:  Chronic pain syndrome    Rx / DC Orders ED Discharge Orders    None       Truddie Hidden, MD 06/24/20 1438

## 2020-06-25 ENCOUNTER — Emergency Department (HOSPITAL_COMMUNITY): Payer: Medicare Other

## 2020-06-25 ENCOUNTER — Emergency Department (HOSPITAL_COMMUNITY)
Admission: EM | Admit: 2020-06-25 | Discharge: 2020-06-26 | Disposition: A | Discharge status: Home / Self Care | Payer: Medicare Other | Source: Home / Self Care | Attending: Emergency Medicine | Admitting: Emergency Medicine

## 2020-06-25 DIAGNOSIS — M549 Dorsalgia, unspecified: Secondary | ICD-10-CM

## 2020-06-25 DIAGNOSIS — Z87891 Personal history of nicotine dependence: Secondary | ICD-10-CM | POA: Insufficient documentation

## 2020-06-25 DIAGNOSIS — Z79899 Other long term (current) drug therapy: Secondary | ICD-10-CM | POA: Insufficient documentation

## 2020-06-25 DIAGNOSIS — R0602 Shortness of breath: Secondary | ICD-10-CM | POA: Insufficient documentation

## 2020-06-25 DIAGNOSIS — M5442 Lumbago with sciatica, left side: Secondary | ICD-10-CM | POA: Insufficient documentation

## 2020-06-25 DIAGNOSIS — R3912 Poor urinary stream: Secondary | ICD-10-CM | POA: Insufficient documentation

## 2020-06-25 DIAGNOSIS — I2510 Atherosclerotic heart disease of native coronary artery without angina pectoris: Secondary | ICD-10-CM | POA: Insufficient documentation

## 2020-06-25 DIAGNOSIS — K921 Melena: Secondary | ICD-10-CM | POA: Insufficient documentation

## 2020-06-25 DIAGNOSIS — I10 Essential (primary) hypertension: Secondary | ICD-10-CM | POA: Insufficient documentation

## 2020-06-25 DIAGNOSIS — M5432 Sciatica, left side: Secondary | ICD-10-CM

## 2020-06-25 DIAGNOSIS — R079 Chest pain, unspecified: Secondary | ICD-10-CM | POA: Insufficient documentation

## 2020-06-25 DIAGNOSIS — R109 Unspecified abdominal pain: Secondary | ICD-10-CM | POA: Insufficient documentation

## 2020-06-25 DIAGNOSIS — E119 Type 2 diabetes mellitus without complications: Secondary | ICD-10-CM | POA: Insufficient documentation

## 2020-06-25 LAB — POC OCCULT BLOOD, ED: Fecal Occult Bld: NEGATIVE

## 2020-06-25 MED ORDER — MORPHINE SULFATE (PF) 4 MG/ML IV SOLN
4.0000 mg | Freq: Once | INTRAVENOUS | Status: AC
  Administered 2020-06-25: 4 mg via INTRAVENOUS
  Filled 2020-06-25: qty 1

## 2020-06-25 MED ORDER — ONDANSETRON HCL 4 MG/2ML IJ SOLN
4.0000 mg | Freq: Once | INTRAMUSCULAR | Status: AC
  Administered 2020-06-25: 4 mg via INTRAVENOUS
  Filled 2020-06-25: qty 2

## 2020-06-25 MED ORDER — KETOROLAC TROMETHAMINE 30 MG/ML IJ SOLN
30.0000 mg | Freq: Once | INTRAMUSCULAR | Status: AC
  Administered 2020-06-25: 30 mg via INTRAVENOUS
  Filled 2020-06-25: qty 1

## 2020-06-25 NOTE — ED Notes (Signed)
Patient states he lives alone and gets away via a motorized wheelchair.

## 2020-06-25 NOTE — ED Notes (Signed)
10th on ptar list

## 2020-06-25 NOTE — ED Notes (Signed)
3rd on ptar list  

## 2020-06-25 NOTE — ED Triage Notes (Signed)
Pt arrives via EMS from home. Pt alert and oriented. Pt reports diarrhea X2 weeks and in last few days turned to black tar like stool. Pt also complains about sciatica pain and decreased urinary output.  22g left finger 600NS given en route 266mcg fentanyl

## 2020-06-25 NOTE — ED Provider Notes (Signed)
Medical screening examination/treatment/procedure(s) were conducted as a shared visit with non-physician practitioner(s) and myself.  I personally evaluated the patient during the encounter.       Charlesetta Shanks, MD 06/26/20 [phone]

## 2020-06-25 NOTE — ED Notes (Signed)
Ptar called pt is number 15 on the list

## 2020-06-25 NOTE — ED Provider Notes (Addendum)
[place] EMERGENCY DEPARTMENT Provider Note   CSN: [id number] Arrival date & time: 06/25/20  1404     History Chief Complaint  Patient presents with  . Melena    Jorge Sherman is a 79 y.o. male.  HPI 79 year old male with a history of OSA, spinal stenosis, thoracic radiculitis, DM type II, depressive disorder resents to the ER with multiple complaints.  Patient states over the last few days, he has been having a flareup of his sciatica.  Patient states that he has pain rating down his left leg.  Was seen at [facility] ER yesterday, complaining of pain all over as he cannot afford the Suboxone prescription he was written by pain management.  He has not followed up with pain management since. He also complains of pain to his sacrum. He also states that the last 2 weeks he has been having diarrhea, and noticed that 2 days ago it had turned dark and tarry like.  He also states that he has been having less urinary output, describes burning with urination.  Denies any abdominal pain, fever, chills.  He said he had 1 episode of centralized chest pain and shortness of breath earlier today.  No headache or dizziness. When asked what the purpose of his visit to the ER was today, he states that he needs pain medicine. He states that he was put on Suboxone by his previous provider, and recently was told that it cost $700 for 30-day supply. He states he reached out to his pain doctor, and they responded with discussing further treatment options at his next visit. He states that he got extremely angry, and has not scheduled a follow-up with them.  Patient states that he lives alone and ambulates with an IT trainer wheelchair. He states he is able to transfer from the wheelchair to the bed or to return vice versa. He states he has been having some difficulty over the last few days given his sciatica. He denies any groin numbness, bowel and bladder incontinence. Denies any numbness through his  lower extremities.      Past Medical History:  Diagnosis Date  . Chronic pain   . Depressive disorder, not elsewhere classified   . Dizziness and giddiness   . Essential and other specified forms of tremor   . Headache(784.0)   . Leukocytosis, unspecified   . Obstructive sleep apnea (adult) (pediatric)   . Pain in joint, lower leg   . Septic joint of left knee joint ([place]) 04/22/2019  . Spinal stenosis, lumbar region, without neurogenic claudication   . Thoracic or lumbosacral neuritis or radiculitis, unspecified   . Type II or unspecified type diabetes mellitus without mention of complication, not stated as uncontrolled   . Unspecified cardiovascular disease   . Unspecified essential hypertension     Patient Active Problem List   Diagnosis Date Noted  . Altered mental status 08/28/2019  . Septic joint of left knee joint ([place]) 04/22/2019  . Erectile dysfunction 10/30/2013  . Essential hypertension, benign 10/30/2013  . Coronary atherosclerosis of native coronary artery 10/30/2013  . Dizziness and giddiness   . Pain in joint, lower leg   . Leukocytosis, unspecified   . Type II or unspecified type diabetes mellitus without mention of complication, not stated as uncontrolled   . Obstructive sleep apnea   . Depressive disorder, not elsewhere classified   . Essential and other specified forms of tremor   . Headache(784.0)   . Spinal stenosis, lumbar region,  without neurogenic claudication   . Thoracic or lumbosacral neuritis or radiculitis, unspecified   . Chronic pain     Past Surgical History:  Procedure Laterality Date  . BACK SURGERY    . back surgey     times 3       Family History  Problem Relation Age of Onset  . Cancer Father   . Heart disease Father   . Stroke Mother   . Diabetes Mother     Social History   Tobacco Use  . Smoking status: Former Research scientist (life sciences)  . Smokeless tobacco: Never Used  Substance Use Topics  . Alcohol use: No  . Drug use: No     Home Medications Prior to Admission medications   Medication Sig Start Date End Date Taking? Authorizing Provider  amLODipine (NORVASC) 2.5 MG tablet Take 2.5 mg by mouth daily.    [provider]  Buprenorphine HCl-Naloxone HCl 8-2 MG FILM Place under the tongue 3 (three) times daily. 04/09/20   [provider]  celecoxib (CELEBREX) 200 MG capsule Take 200 mg by mouth 2 (two) times daily.    [provider]  cloNIDine (CATAPRES) 0.1 MG tablet Take 0.1 mg by mouth 2 (two) times daily.     [provider]  DULoxetine (CYMBALTA) 30 MG capsule Take 30 mg by mouth 3 (three) times daily.     [provider]  ferrous sulfate 325 (65 FE) MG tablet Take 325 mg by mouth daily.     [provider]  furosemide (LASIX) 20 MG tablet Take 20 mg by mouth daily as needed. 01/20/20   [provider]  gabapentin (NEURONTIN) 300 MG capsule Take 2 capsules (600 mg total) by mouth 3 (three) times daily. 08/30/19   Nita Sells, MD  gabapentin (NEURONTIN) 800 MG tablet Take 800 mg by mouth 3 (three) times daily. 06/10/20   [provider]  hydroxychloroquine (PLAQUENIL) 200 MG tablet Take 200 mg by mouth 2 (two) times daily.    [provider]  ipratropium-albuterol (DUONEB) 0.5-2.5 (3) MG/3ML SOLN SMARTSIG:1 Vial(s) Via Nebulizer PRN 02/13/20   [provider]  Iron-FA-B Cmp-C-Biot-Probiotic (FUSION PLUS PO) Take 1 capsule by mouth daily.    [provider]  ketorolac (TORADOL) 10 MG tablet Take 10 mg by mouth every 6 (six) hours as needed. 06/15/20   [provider]  Lidocaine-Menthol 3.6-1.25 % PTCH Place onto the skin. 01/02/20   [provider]  linaclotide (LINZESS) 72 MCG capsule Take 72 mcg by mouth daily before breakfast.    [provider]  LORazepam (ATIVAN) 1 MG tablet Take 2 mg by mouth every 6 (six) hours as needed for anxiety.     [provider]  methocarbamol  (ROBAXIN) 500 MG tablet Take 1,000 mg by mouth 3 (three) times daily. 06/15/20   [provider]  NARCAN 4 MG/0.1ML LIQD nasal spray kit SMARTSIG:1 Spray(s) Both Nares 01/27/20   [provider]  omeprazole (PRILOSEC) 40 MG capsule Take 40 mg by mouth daily.    [provider]  oxyCODONE (ROXICODONE) 5 MG immediate release tablet Take 1 tablet (5 mg total) by mouth every 6 (six) hours as needed for up to 12 doses for severe pain. 06/09/20   Breck Coons, MD  Oxycodone HCl 20 MG TABS Take 1 tablet by mouth every 6 (six) hours as needed. 01/24/20   [provider]  OXYCONTIN 20 MG 12 hr tablet SMARTSIG:1 Tablet(s) By Mouth Every 12 Hours 01/24/20  [provider]  rosuvastatin (CRESTOR) 40 MG tablet Take 40 mg by mouth every evening.     [provider]  tamsulosin (FLOMAX) 0.4 MG CAPS capsule Take 0.4 mg by mouth daily.    [provider]    Allergies    Cefepime, Iohexol, Fentanyl, and Iodine  Review of Systems   Review of Systems  Physical Exam Updated Vital Signs BP (!) 127/58   Pulse 90   Temp 98.7 F (37.1 C) (Oral)   Resp 17   SpO2 99%   Physical Exam Vitals and nursing note reviewed.  Constitutional:      Appearance: He is well-developed. He is obese. He is ill-appearing (Chronically ill appearing).  HENT:     Head: Normocephalic and atraumatic.     Nose: Nose normal.     Mouth/Throat:     Mouth: Mucous membranes are moist.     Pharynx: Oropharynx is clear.  Eyes:     Conjunctiva/sclera: Conjunctivae normal.  Cardiovascular:     Rate and Rhythm: Normal rate and regular rhythm.     Pulses: Normal pulses.     Heart sounds: Normal heart sounds. No murmur heard.   Pulmonary:     Effort: Pulmonary effort is normal. No respiratory distress.     Breath sounds: Normal breath sounds.  Abdominal:     General: Bowel sounds are normal. There is distension.     Palpations: Abdomen is soft.     Tenderness: There is  abdominal tenderness.  Genitourinary:    Rectum: Guaiac result negative.     Comments: Good rectal tone Musculoskeletal:        General: Tenderness present. No swelling.     Cervical back: Normal range of motion and neck supple.     Right lower leg: No edema.     Left lower leg: No edema.     Comments: Moving all 4 extremities, the left lower extremity range of motion slightly reduced secondary to pain. Gross sensations intact in both bilateral lower extremities. 5/5 strength with dorsi flexion and plantar flexion. Patient does have midline L-spine tenderness, no noticeable crepitus or step-offs. He also has some tenderness to palpation to his sacrum. No evidence of bruising, deformities.  Skin:    General: Skin is warm and dry.     Capillary Refill: Capillary refill takes less than 2 seconds.     Findings: No bruising or erythema.  Neurological:     General: No focal deficit present.     Mental Status: He is alert and oriented to person, place, and time.     Sensory: No sensory deficit.  Psychiatric:        Mood and Affect: Mood normal.        Behavior: Behavior normal.     ED Results / Procedures / Treatments   Labs (all labs ordered are listed, but only abnormal results are displayed) Labs Reviewed  CBC WITH DIFFERENTIAL/PLATELET  COMPREHENSIVE METABOLIC PANEL  URINALYSIS, ROUTINE W REFLEX MICROSCOPIC  POC OCCULT BLOOD, ED  TROPONIN I (HIGH SENSITIVITY)  TROPONIN I (HIGH SENSITIVITY)    EKG None  Radiology DG Chest Portable 1 View  Result Date: 06/25/2020 CLINICAL DATA:  Chest pain, diarrhea for 2 weeks EXAM: PORTABLE CHEST 1 VIEW COMPARISON:  Radiograph 08/31/2019 FINDINGS: Low lung volumes with chronic mild elevation of the right hemidiaphragm. No consolidation, features of edema, pneumothorax, or effusion. Stable cardiomediastinal contours. No acute osseous or soft tissue abnormality. Degenerative changes are present in the imaged  spine and shoulders. Features are quite  severe in the left glenohumeral joint, similar to comparison. IMPRESSION: 1. Low lung volumes with chronic mild elevation of the right hemidiaphragm. 2. No acute cardiopulmonary findings. Electronically Signed   By: Lovena Le M.D.   On: 06/25/2020 14:55    Procedures Procedures (including critical care time)  Medications Ordered in ED Medications  ketorolac (TORADOL) 30 MG/ML injection 30 mg (has no administration in time range)  morphine 4 MG/ML injection 4 mg (4 mg Intravenous Given 06/25/20 1552)  ondansetron (ZOFRAN) injection 4 mg (4 mg Intravenous Given 06/25/20 1600)    ED Course  I have reviewed the triage vital signs and the nursing notes.  Pertinent labs & imaging results that were available during my care of the patient were reviewed by me and considered in my medical decision making (see chart for details).    MDM Rules/Calculators/A&P                         79 year old male with multiple complaints including sciatica, tarry appearing stools, decreased urine output  On presentation, the patient is a chronically ill-appearing male, appears to be in pain, however is not toxic appearing, nondiaphoretic, no acute distress Vital signs on arrival overall reassuring, no evidence of fever, normotensive, not hypoxic or tachypneic Physical exam with mild tenderness to the abdomen, he does have quite significant amount of distention. He has some midline tenderness to the L-spine, but no noticeable step-offs or crepitus. Range of motion to the left leg slightly decreased secondary to pain. Gross sensations intact, plantar flexion and dorsiflexion strength intact. On rectal exam, he did have good rectal tone and no noticeable melanotic stool or bright red blood per rectum.  Fecal occult negative. Chest x-ray with low lung volumes, but no acute processes. EKG with no significant changes from last tracing  Patient was seen and evaluated by Dr. Vallery Ridge, given that the patient has not had  imaging in the last year, along with his age and comorbidities, ordered CT scan of the abdomen pelvis and L-spine.  5:03 PM: Called to bedside by nursing staff.  Patient is refusing all blood work and CT scans.  He states that nothing is wrong with him, he "just needs a shot of Toradol and I will be on my way".  Patient was given 4 mg of morphine, he states that this has not helped his pain at all.  He states that Toradol normally helps his pain.  He states that he does not want blood work done as he bruises a lot.  He also states that there has been no change in the status of his health, other than worsening sciatica.  He does not have any red flag signs to suggest cauda equina.  No night sweats, unintended weight loss, no IVDU.  Abdomen was slightly tender, however no peritoneal signs.  No rebound.  Vital signs overall reassuring.  I attempted to contact the patient's son twice, and did not receive a callback. I reviewed his renal function from his visit on  7/20 which was normal. Pt given 1 shot of Toradol here in the ED.  Patient states that his last bowel movement was yesterday, but has a history of constipation.  I encouraged him to take MiraLAX.  I stressed that he needs to follow-up with his pain clinic, and that he cannot be coming here to the ER requesting pain medication.  He voices understanding.  I also encouraged  him to follow-up with his primary care doctor.  Very strict return precautions given which includes groin numbness, incontinence of bowel and bladder, worsening abdominal pain, fevers, etc.  Discussed the case with Dr. Wilson Singer, who is agreeable to this.  Final Clinical Impression(s) / ED Diagnoses Final diagnoses:  Back pain  Sciatica of left side    Rx / DC Orders ED Discharge Orders    None           Lyndel Safe 06/25/20 1713    Charlesetta Shanks, MD 06/26/20 1956

## 2020-06-25 NOTE — ED Notes (Signed)
Pt hollering about pain. Placed pt on bedpan for BM

## 2020-06-25 NOTE — ED Notes (Signed)
Bladder scan 296

## 2020-06-25 NOTE — ED Notes (Addendum)
This RN and PA Walker Shadow spoke with pt. Pt is refusing blood work and the CT and states he does not need it. PA explained why we need blood work and scans pt states he understands but he is fine. Pt wants Toradol and to be sent home. PA Belaya states that's fine she will start working on getting him out of here.

## 2020-06-25 NOTE — Discharge Instructions (Addendum)
You understand that by leaving without having bloodwork or imaging done you run the risk of severe disability or even death. You are welcome to return to the ER at anytime for further workup.  You need to make sure that you follow up with your chronic pain doctor for further treatment of your pain. The ER cannot dispense chronic pain medication on a regular basis. You could also benefit from some physical therapy, please discuss this with your doctor.   In terms of your constipation, please take over the counter Miralax as directed until you have a bowel movement.

## 2020-06-25 NOTE — ED Notes (Signed)
ptar called 

## 2020-06-25 NOTE — ED Notes (Signed)
Pt refused to have labs drawn.

## 2020-06-25 NOTE — ED Notes (Signed)
Pt refusing blood work and CT scan PA [employer] notified.

## 2020-06-26 ENCOUNTER — Encounter (HOSPITAL_COMMUNITY): Payer: Self-pay

## 2020-06-26 ENCOUNTER — Other Ambulatory Visit: Payer: Self-pay

## 2020-06-26 DIAGNOSIS — N401 Enlarged prostate with lower urinary tract symptoms: Secondary | ICD-10-CM | POA: Diagnosis present

## 2020-06-26 DIAGNOSIS — Z20822 Contact with and (suspected) exposure to covid-19: Secondary | ICD-10-CM | POA: Diagnosis present

## 2020-06-26 DIAGNOSIS — Z9119 Patient's noncompliance with other medical treatment and regimen: Secondary | ICD-10-CM

## 2020-06-26 DIAGNOSIS — E876 Hypokalemia: Secondary | ICD-10-CM | POA: Diagnosis present

## 2020-06-26 DIAGNOSIS — G4733 Obstructive sleep apnea (adult) (pediatric): Secondary | ICD-10-CM | POA: Diagnosis present

## 2020-06-26 DIAGNOSIS — I2510 Atherosclerotic heart disease of native coronary artery without angina pectoris: Secondary | ICD-10-CM | POA: Diagnosis present

## 2020-06-26 DIAGNOSIS — K567 Ileus, unspecified: Secondary | ICD-10-CM | POA: Diagnosis present

## 2020-06-26 DIAGNOSIS — I10 Essential (primary) hypertension: Secondary | ICD-10-CM | POA: Diagnosis present

## 2020-06-26 DIAGNOSIS — N179 Acute kidney failure, unspecified: Principal | ICD-10-CM | POA: Diagnosis present

## 2020-06-26 DIAGNOSIS — E871 Hypo-osmolality and hyponatremia: Secondary | ICD-10-CM | POA: Diagnosis present

## 2020-06-26 DIAGNOSIS — M069 Rheumatoid arthritis, unspecified: Secondary | ICD-10-CM | POA: Diagnosis present

## 2020-06-26 DIAGNOSIS — R809 Proteinuria, unspecified: Secondary | ICD-10-CM | POA: Diagnosis present

## 2020-06-26 DIAGNOSIS — Z515 Encounter for palliative care: Secondary | ICD-10-CM | POA: Diagnosis present

## 2020-06-26 DIAGNOSIS — Z87440 Personal history of urinary (tract) infections: Secondary | ICD-10-CM

## 2020-06-26 DIAGNOSIS — Z993 Dependence on wheelchair: Secondary | ICD-10-CM

## 2020-06-26 DIAGNOSIS — G894 Chronic pain syndrome: Secondary | ICD-10-CM | POA: Diagnosis present

## 2020-06-26 DIAGNOSIS — D72829 Elevated white blood cell count, unspecified: Secondary | ICD-10-CM | POA: Diagnosis present

## 2020-06-26 DIAGNOSIS — F329 Major depressive disorder, single episode, unspecified: Secondary | ICD-10-CM | POA: Diagnosis present

## 2020-06-26 DIAGNOSIS — Z881 Allergy status to other antibiotic agents status: Secondary | ICD-10-CM

## 2020-06-26 DIAGNOSIS — Z022 Encounter for examination for admission to residential institution: Secondary | ICD-10-CM

## 2020-06-26 DIAGNOSIS — M25562 Pain in left knee: Secondary | ICD-10-CM | POA: Diagnosis present

## 2020-06-26 DIAGNOSIS — M25512 Pain in left shoulder: Secondary | ICD-10-CM | POA: Diagnosis present

## 2020-06-26 DIAGNOSIS — Z602 Problems related to living alone: Secondary | ICD-10-CM | POA: Diagnosis present

## 2020-06-26 DIAGNOSIS — M25561 Pain in right knee: Secondary | ICD-10-CM | POA: Diagnosis present

## 2020-06-26 DIAGNOSIS — E860 Dehydration: Secondary | ICD-10-CM | POA: Diagnosis present

## 2020-06-26 DIAGNOSIS — E785 Hyperlipidemia, unspecified: Secondary | ICD-10-CM | POA: Diagnosis present

## 2020-06-26 DIAGNOSIS — Z91041 Radiographic dye allergy status: Secondary | ICD-10-CM

## 2020-06-26 DIAGNOSIS — Z885 Allergy status to narcotic agent status: Secondary | ICD-10-CM

## 2020-06-26 DIAGNOSIS — Z791 Long term (current) use of non-steroidal anti-inflammatories (NSAID): Secondary | ICD-10-CM

## 2020-06-26 DIAGNOSIS — E119 Type 2 diabetes mellitus without complications: Secondary | ICD-10-CM | POA: Diagnosis present

## 2020-06-26 DIAGNOSIS — F444 Conversion disorder with motor symptom or deficit: Secondary | ICD-10-CM | POA: Diagnosis present

## 2020-06-26 DIAGNOSIS — R309 Painful micturition, unspecified: Secondary | ICD-10-CM | POA: Diagnosis present

## 2020-06-26 DIAGNOSIS — E861 Hypovolemia: Secondary | ICD-10-CM | POA: Diagnosis present

## 2020-06-26 DIAGNOSIS — Z79899 Other long term (current) drug therapy: Secondary | ICD-10-CM

## 2020-06-26 DIAGNOSIS — M25511 Pain in right shoulder: Secondary | ICD-10-CM | POA: Diagnosis present

## 2020-06-26 DIAGNOSIS — M5432 Sciatica, left side: Secondary | ICD-10-CM | POA: Diagnosis present

## 2020-06-26 DIAGNOSIS — Z888 Allergy status to other drugs, medicaments and biological substances status: Secondary | ICD-10-CM

## 2020-06-26 DIAGNOSIS — Z87891 Personal history of nicotine dependence: Secondary | ICD-10-CM

## 2020-06-26 DIAGNOSIS — E872 Acidosis: Secondary | ICD-10-CM | POA: Diagnosis present

## 2020-06-26 MED ORDER — SODIUM CHLORIDE 0.9% FLUSH: 3.0000 mL | Freq: Once | INTRAVENOUS | Status: DC

## 2020-06-26 NOTE — ED Notes (Signed)
Pt called staff out to assist him. He was asking about a bed so that he could lay down. I informed him that everyone is currently waiting for a bed, and we do not have a bed at this time.

## 2020-06-26 NOTE — ED Notes (Addendum)
Patient complained about chest pain. Tried to acquire EKG but patient kept on moving around. Nurse was informed.

## 2020-06-26 NOTE — ED Notes (Signed)
Pt's discharge instructions given to PTAR, pt discharged with PTAR stable

## 2020-06-26 NOTE — ED Triage Notes (Signed)
Arrived by EMS from home. Patient reports N,V,D x 2-3 days. Patient reports he has not had a chance to follow up with PCP.

## 2020-06-27 ENCOUNTER — Inpatient Hospital Stay (HOSPITAL_COMMUNITY)
Admission: EM | Admit: 2020-06-27 | Discharge: 2020-07-06 | DRG: 683 | Disposition: A | Discharge status: Skilled Nursing Facility | Payer: Medicare Other | Attending: Internal Medicine | Admitting: Internal Medicine

## 2020-06-27 ENCOUNTER — Emergency Department (HOSPITAL_COMMUNITY): Payer: Medicare Other

## 2020-06-27 DIAGNOSIS — M00862 Arthritis due to other bacteria, left knee: Secondary | ICD-10-CM

## 2020-06-27 DIAGNOSIS — R140 Abdominal distension (gaseous): Secondary | ICD-10-CM

## 2020-06-27 DIAGNOSIS — M069 Rheumatoid arthritis, unspecified: Secondary | ICD-10-CM | POA: Diagnosis present

## 2020-06-27 DIAGNOSIS — G8929 Other chronic pain: Secondary | ICD-10-CM | POA: Diagnosis present

## 2020-06-27 DIAGNOSIS — E871 Hypo-osmolality and hyponatremia: Secondary | ICD-10-CM | POA: Diagnosis present

## 2020-06-27 DIAGNOSIS — Z7189 Other specified counseling: Secondary | ICD-10-CM | POA: Diagnosis not present

## 2020-06-27 DIAGNOSIS — I2510 Atherosclerotic heart disease of native coronary artery without angina pectoris: Secondary | ICD-10-CM | POA: Diagnosis present

## 2020-06-27 DIAGNOSIS — N179 Acute kidney failure, unspecified: Secondary | ICD-10-CM | POA: Diagnosis present

## 2020-06-27 DIAGNOSIS — R4182 Altered mental status, unspecified: Secondary | ICD-10-CM

## 2020-06-27 DIAGNOSIS — D72829 Elevated white blood cell count, unspecified: Secondary | ICD-10-CM | POA: Diagnosis present

## 2020-06-27 DIAGNOSIS — Z20822 Contact with and (suspected) exposure to covid-19: Secondary | ICD-10-CM | POA: Diagnosis present

## 2020-06-27 DIAGNOSIS — R197 Diarrhea, unspecified: Secondary | ICD-10-CM | POA: Diagnosis present

## 2020-06-27 DIAGNOSIS — Z79899 Other long term (current) drug therapy: Secondary | ICD-10-CM | POA: Diagnosis not present

## 2020-06-27 DIAGNOSIS — E119 Type 2 diabetes mellitus without complications: Secondary | ICD-10-CM | POA: Diagnosis present

## 2020-06-27 DIAGNOSIS — K567 Ileus, unspecified: Secondary | ICD-10-CM

## 2020-06-27 DIAGNOSIS — G4733 Obstructive sleep apnea (adult) (pediatric): Secondary | ICD-10-CM | POA: Diagnosis present

## 2020-06-27 DIAGNOSIS — I10 Essential (primary) hypertension: Secondary | ICD-10-CM | POA: Diagnosis present

## 2020-06-27 DIAGNOSIS — Z888 Allergy status to other drugs, medicaments and biological substances status: Secondary | ICD-10-CM | POA: Diagnosis not present

## 2020-06-27 DIAGNOSIS — E876 Hypokalemia: Secondary | ICD-10-CM | POA: Diagnosis present

## 2020-06-27 DIAGNOSIS — Z91041 Radiographic dye allergy status: Secondary | ICD-10-CM | POA: Diagnosis not present

## 2020-06-27 DIAGNOSIS — Z993 Dependence on wheelchair: Secondary | ICD-10-CM | POA: Diagnosis not present

## 2020-06-27 DIAGNOSIS — G894 Chronic pain syndrome: Secondary | ICD-10-CM | POA: Diagnosis present

## 2020-06-27 DIAGNOSIS — E861 Hypovolemia: Secondary | ICD-10-CM | POA: Diagnosis present

## 2020-06-27 DIAGNOSIS — Z791 Long term (current) use of non-steroidal anti-inflammatories (NSAID): Secondary | ICD-10-CM | POA: Diagnosis not present

## 2020-06-27 DIAGNOSIS — R5381 Other malaise: Secondary | ICD-10-CM

## 2020-06-27 DIAGNOSIS — Z515 Encounter for palliative care: Secondary | ICD-10-CM | POA: Diagnosis not present

## 2020-06-27 DIAGNOSIS — F329 Major depressive disorder, single episode, unspecified: Secondary | ICD-10-CM | POA: Diagnosis present

## 2020-06-27 DIAGNOSIS — E872 Acidosis: Secondary | ICD-10-CM | POA: Diagnosis present

## 2020-06-27 DIAGNOSIS — E785 Hyperlipidemia, unspecified: Secondary | ICD-10-CM | POA: Diagnosis present

## 2020-06-27 DIAGNOSIS — Z9119 Patient's noncompliance with other medical treatment and regimen: Secondary | ICD-10-CM | POA: Diagnosis not present

## 2020-06-27 DIAGNOSIS — R809 Proteinuria, unspecified: Secondary | ICD-10-CM | POA: Diagnosis present

## 2020-06-27 LAB — DIFFERENTIAL
Abs Immature Granulocytes: 0.59 10*3/uL — ABNORMAL HIGH (ref 0.00–0.07)
Basophils Absolute: 0.1 10*3/uL (ref 0.0–0.1)
Basophils Relative: 0 %
Eosinophils Absolute: 0 10*3/uL (ref 0.0–0.5)
Eosinophils Relative: 0 %
Immature Granulocytes: 2 %
Lymphocytes Relative: 3 %
Lymphs Abs: 0.8 10*3/uL (ref 0.7–4.0)
Monocytes Absolute: 2 10*3/uL — ABNORMAL HIGH (ref 0.1–1.0)
Monocytes Relative: 8 %
Neutro Abs: 23.9 10*3/uL — ABNORMAL HIGH (ref 1.7–7.7)
Neutrophils Relative %: 87 %
nRBC: 0 /100 WBC

## 2020-06-27 LAB — URINALYSIS, ROUTINE W REFLEX MICROSCOPIC
Bacteria, UA: NONE SEEN
Bilirubin Urine: NEGATIVE
Glucose, UA: NEGATIVE mg/dL
Hgb urine dipstick: NEGATIVE
Ketones, ur: 5 mg/dL — AB
Leukocytes,Ua: NEGATIVE
Nitrite: NEGATIVE
Protein, ur: 100 mg/dL — AB
Specific Gravity, Urine: 1.02 (ref 1.005–1.030)
pH: 5 (ref 5.0–8.0)

## 2020-06-27 LAB — CBC
HCT: 41.9 % (ref 39.0–52.0)
Hemoglobin: 14.3 g/dL (ref 13.0–17.0)
MCH: 29.6 pg (ref 26.0–34.0)
MCHC: 34.1 g/dL (ref 30.0–36.0)
MCV: 86.7 fL (ref 80.0–100.0)
Platelets: 513 10*3/uL — ABNORMAL HIGH (ref 150–400)
RBC: 4.83 MIL/uL (ref 4.22–5.81)
RDW: 14.6 % (ref 11.5–15.5)
WBC: 27.9 10*3/uL — ABNORMAL HIGH (ref 4.0–10.5)
nRBC: 0 % (ref 0.0–0.2)

## 2020-06-27 LAB — COMPREHENSIVE METABOLIC PANEL
ALT: 23 U/L (ref 0–44)
AST: 24 U/L (ref 15–41)
Albumin: 5.2 g/dL — ABNORMAL HIGH (ref 3.5–5.0)
Alkaline Phosphatase: 70 U/L (ref 38–126)
Anion gap: 18 — ABNORMAL HIGH (ref 5–15)
BUN: 40 mg/dL — ABNORMAL HIGH (ref 8–23)
CO2: 18 mmol/L — ABNORMAL LOW (ref 22–32)
Calcium: 10.1 mg/dL (ref 8.9–10.3)
Chloride: 97 mmol/L — ABNORMAL LOW (ref 98–111)
Creatinine, Ser: 1.87 mg/dL — ABNORMAL HIGH (ref 0.61–1.24)
GFR calc Af Amer: 39 mL/min — ABNORMAL LOW (ref >60)
GFR calc non Af Amer: 33 mL/min — ABNORMAL LOW (ref >60)
Glucose, Bld: 189 mg/dL — ABNORMAL HIGH (ref 70–99)
Potassium: 3.4 mmol/L — ABNORMAL LOW (ref 3.5–5.1)
Sodium: 133 mmol/L — ABNORMAL LOW (ref 135–145)
Total Bilirubin: 0.6 mg/dL (ref 0.3–1.2)
Total Protein: 8.5 g/dL — ABNORMAL HIGH (ref 6.5–8.1)

## 2020-06-27 LAB — LACTIC ACID, PLASMA
Lactic Acid, Venous: 2 mmol/L (ref 0.5–1.9)
Lactic Acid, Venous: 1.4 mmol/L (ref 0.5–1.9)

## 2020-06-27 LAB — LIPASE, BLOOD: Lipase: 41 U/L (ref 11–51)

## 2020-06-27 LAB — POC OCCULT BLOOD, ED: Fecal Occult Bld: NEGATIVE

## 2020-06-27 LAB — SARS CORONAVIRUS 2 BY RT PCR (HOSPITAL ORDER, PERFORMED IN ~~LOC~~ HOSPITAL LAB): SARS Coronavirus 2: NEGATIVE

## 2020-06-27 MED ORDER — LORAZEPAM 0.5 MG PO TABS
0.5000 mg | ORAL_TABLET | Freq: Three times a day (TID) | ORAL | Status: DC | PRN
  Administered 2020-06-27 – 2020-07-06 (×19): 0.5 mg via ORAL
  Filled 2020-06-27 (×19): qty 1

## 2020-06-27 MED ORDER — ACETAMINOPHEN 325 MG PO TABS
650.0000 mg | ORAL_TABLET | Freq: Four times a day (QID) | ORAL | Status: DC | PRN
  Administered 2020-06-27 – 2020-07-06 (×15): 650 mg via ORAL
  Filled 2020-06-27 (×15): qty 2

## 2020-06-27 MED ORDER — TAMSULOSIN HCL 0.4 MG PO CAPS
0.4000 mg | ORAL_CAPSULE | Freq: Every day | ORAL | Status: DC
  Administered 2020-06-28 – 2020-07-06 (×9): 0.4 mg via ORAL
  Filled 2020-06-27 (×10): qty 1

## 2020-06-27 MED ORDER — HEPARIN SODIUM (PORCINE) 5000 UNIT/ML IJ SOLN
5000.0000 [IU] | Freq: Three times a day (TID) | INTRAMUSCULAR | Status: DC
  Administered 2020-06-27 – 2020-07-06 (×26): 5000 [IU] via SUBCUTANEOUS
  Filled 2020-06-27 (×26): qty 1

## 2020-06-27 MED ORDER — AMLODIPINE BESYLATE 5 MG PO TABS
5.0000 mg | ORAL_TABLET | Freq: Every day | ORAL | Status: DC
  Administered 2020-06-27 – 2020-07-06 (×10): 5 mg via ORAL
  Filled 2020-06-27 (×10): qty 1

## 2020-06-27 MED ORDER — SODIUM CHLORIDE 0.9 % IV BOLUS
500.0000 mL | Freq: Once | INTRAVENOUS | Status: AC
  Administered 2020-06-27: 500 mL via INTRAVENOUS

## 2020-06-27 MED ORDER — ALUM & MAG HYDROXIDE-SIMETH 200-200-20 MG/5ML PO SUSP
30.0000 mL | ORAL | Status: DC | PRN
  Administered 2020-06-27 – 2020-07-03 (×11): 30 mL via ORAL
  Filled 2020-06-27 (×12): qty 30

## 2020-06-27 MED ORDER — ONDANSETRON HCL 4 MG PO TABS
4.0000 mg | ORAL_TABLET | Freq: Four times a day (QID) | ORAL | Status: DC | PRN
  Administered 2020-07-01 (×2): 4 mg via ORAL
  Filled 2020-06-27 (×2): qty 1

## 2020-06-27 MED ORDER — METHOCARBAMOL 500 MG PO TABS
500.0000 mg | ORAL_TABLET | Freq: Four times a day (QID) | ORAL | Status: DC | PRN
  Administered 2020-06-27 – 2020-06-30 (×7): 500 mg via ORAL
  Filled 2020-06-27 (×7): qty 1

## 2020-06-27 MED ORDER — ROSUVASTATIN CALCIUM 20 MG PO TABS
40.0000 mg | ORAL_TABLET | Freq: Every evening | ORAL | Status: DC
  Administered 2020-06-27 – 2020-07-05 (×9): 40 mg via ORAL
  Filled 2020-06-27 (×8): qty 2

## 2020-06-27 MED ORDER — OXYCODONE HCL 5 MG PO TABS
5.0000 mg | ORAL_TABLET | ORAL | Status: DC | PRN
  Administered 2020-06-27 – 2020-06-29 (×5): 5 mg via ORAL
  Filled 2020-06-27 (×5): qty 1

## 2020-06-27 MED ORDER — POTASSIUM CHLORIDE IN NACL 20-0.9 MEQ/L-% IV SOLN
INTRAVENOUS | Status: AC
  Filled 2020-06-27 (×3): qty 1000

## 2020-06-27 MED ORDER — SODIUM CHLORIDE 0.9 % IV BOLUS
1000.0000 mL | Freq: Once | INTRAVENOUS | Status: AC
  Administered 2020-06-27: 1000 mL via INTRAVENOUS

## 2020-06-27 MED ORDER — GABAPENTIN 300 MG PO CAPS
600.0000 mg | ORAL_CAPSULE | Freq: Three times a day (TID) | ORAL | Status: DC
  Administered 2020-06-27 – 2020-06-28 (×2): 600 mg via ORAL
  Filled 2020-06-27 (×2): qty 2

## 2020-06-27 MED ORDER — HYDRALAZINE HCL 20 MG/ML IJ SOLN
10.0000 mg | Freq: Four times a day (QID) | INTRAMUSCULAR | Status: DC | PRN
  Administered 2020-06-29 – 2020-06-30 (×2): 10 mg via INTRAVENOUS
  Filled 2020-06-27: qty 1

## 2020-06-27 MED ORDER — IPRATROPIUM-ALBUTEROL 0.5-2.5 (3) MG/3ML IN SOLN: 3.0000 mL | RESPIRATORY_TRACT | Status: DC | PRN

## 2020-06-27 MED ORDER — HYDROMORPHONE HCL 1 MG/ML IJ SOLN
0.5000 mg | Freq: Once | INTRAMUSCULAR | Status: AC
  Administered 2020-06-27: 0.5 mg via INTRAVENOUS
  Filled 2020-06-27: qty 1

## 2020-06-27 MED ORDER — ONDANSETRON HCL 4 MG/2ML IJ SOLN
4.0000 mg | Freq: Four times a day (QID) | INTRAMUSCULAR | Status: DC | PRN
  Administered 2020-07-03 – 2020-07-04 (×2): 4 mg via INTRAVENOUS
  Filled 2020-06-27 (×3): qty 2

## 2020-06-27 MED ORDER — ACETAMINOPHEN 650 MG RE SUPP: 650.0000 mg | Freq: Four times a day (QID) | RECTAL | Status: DC | PRN

## 2020-06-27 MED ORDER — HYDROMORPHONE HCL 1 MG/ML IJ SOLN
0.5000 mg | INTRAMUSCULAR | Status: DC | PRN
  Administered 2020-06-27 – 2020-07-02 (×20): 0.5 mg via INTRAVENOUS
  Filled 2020-06-27 (×20): qty 0.5

## 2020-06-27 NOTE — ED Notes (Signed)
Pt was unable to provide a urine specimen when he was assisted into the restroom.

## 2020-06-27 NOTE — ED Notes (Signed)
Pt assisted to the restroom with 2 staff assist.

## 2020-06-27 NOTE — ED Provider Notes (Signed)
[facility] DEPT Provider Note   CSN: [id number] Arrival date & time: 06/26/20  1735     History Chief Complaint  Patient presents with  . Emesis  . Diarrhea    Jorge Sherman is a 79 y.o. male.  Patient returns to ED for evaluation of chronic, uncontrolled pain in his upper and lower back, vomiting and diarrhea x 2 weeks.He reports dark, black stools but also reports he is taking Pepto Bismol regularly. He reports feeling weak and cannot take care of himself. He denies falls and uses a walker and a wheelchair to mobilize at home where he lives alone. He is not aware of fever. He cannot quanitify how many episodes of diarrhea and emesis he has had.   The history is provided by the patient. No language interpreter was used.  Emesis Associated symptoms: diarrhea   Associated symptoms: no chills and no fever   Diarrhea Associated symptoms: vomiting   Associated symptoms: no chills and no fever        Past Medical History:  Diagnosis Date  . Chronic pain   . Depressive disorder, not elsewhere classified   . Dizziness and giddiness   . Essential and other specified forms of tremor   . Headache(784.0)   . Leukocytosis, unspecified   . Obstructive sleep apnea (adult) (pediatric)   . Pain in joint, lower leg   . Septic joint of left knee joint ([place]) 04/22/2019  . Spinal stenosis, lumbar region, without neurogenic claudication   . Thoracic or lumbosacral neuritis or radiculitis, unspecified   . Type II or unspecified type diabetes mellitus without mention of complication, not stated as uncontrolled   . Unspecified cardiovascular disease   . Unspecified essential hypertension     Patient Active Problem List   Diagnosis Date Noted  . Altered mental status 08/28/2019  . Septic joint of left knee joint ([place]) 04/22/2019  . Erectile dysfunction 10/30/2013  . Essential hypertension, benign 10/30/2013  . Coronary atherosclerosis of native coronary  artery 10/30/2013  . Dizziness and giddiness   . Pain in joint, lower leg   . Leukocytosis, unspecified   . Type II or unspecified type diabetes mellitus without mention of complication, not stated as uncontrolled   . Obstructive sleep apnea   . Depressive disorder, not elsewhere classified   . Essential and other specified forms of tremor   . Headache(784.0)   . Spinal stenosis, lumbar region, without neurogenic claudication   . Thoracic or lumbosacral neuritis or radiculitis, unspecified   . Chronic pain     Past Surgical History:  Procedure Laterality Date  . BACK SURGERY    . back surgey     times 3       Family History  Problem Relation Age of Onset  . Cancer Father   . Heart disease Father   . Stroke Mother   . Diabetes Mother     Social History   Tobacco Use  . Smoking status: Former Research scientist (life sciences)  . Smokeless tobacco: Never Used  Substance Use Topics  . Alcohol use: No  . Drug use: No    Home Medications Prior to Admission medications   Medication Sig Start Date End Date Taking? Authorizing Provider  amLODipine (NORVASC) 2.5 MG tablet Take 2.5 mg by mouth daily.    [provider]  Buprenorphine HCl-Naloxone HCl 8-2 MG FILM Place under the tongue 3 (three) times daily. 04/09/20   [provider]  celecoxib (CELEBREX) 200 MG capsule Take  200 mg by mouth 2 (two) times daily.    [provider]  cloNIDine (CATAPRES) 0.1 MG tablet Take 0.1 mg by mouth 2 (two) times daily.     [provider]  DULoxetine (CYMBALTA) 30 MG capsule Take 30 mg by mouth 3 (three) times daily.     [provider]  ferrous sulfate 325 (65 FE) MG tablet Take 325 mg by mouth daily.     [provider]  furosemide (LASIX) 20 MG tablet Take 20 mg by mouth daily as needed. 01/20/20   [provider]  gabapentin (NEURONTIN) 300 MG capsule Take 2 capsules (600 mg total) by mouth 3 (three) times daily. 08/30/19   Nita Sells, MD    gabapentin (NEURONTIN) 800 MG tablet Take 800 mg by mouth 3 (three) times daily. 06/10/20   [provider]  hydroxychloroquine (PLAQUENIL) 200 MG tablet Take 200 mg by mouth 2 (two) times daily.    [provider]  ipratropium-albuterol (DUONEB) 0.5-2.5 (3) MG/3ML SOLN SMARTSIG:1 Vial(s) Via Nebulizer PRN 02/13/20   [provider]  Iron-FA-B Cmp-C-Biot-Probiotic (FUSION PLUS PO) Take 1 capsule by mouth daily.    [provider]  ketorolac (TORADOL) 10 MG tablet Take 10 mg by mouth every 6 (six) hours as needed. 06/15/20   [provider]  Lidocaine-Menthol 3.6-1.25 % PTCH Place onto the skin. 01/02/20   [provider]  linaclotide (LINZESS) 72 MCG capsule Take 72 mcg by mouth daily before breakfast.    [provider]  LORazepam (ATIVAN) 1 MG tablet Take 2 mg by mouth every 6 (six) hours as needed for anxiety.     [provider]  methocarbamol (ROBAXIN) 500 MG tablet Take 1,000 mg by mouth 3 (three) times daily. 06/15/20   [provider]  NARCAN 4 MG/0.1ML LIQD nasal spray kit SMARTSIG:1 Spray(s) Both Nares 01/27/20   [provider]  omeprazole (PRILOSEC) 40 MG capsule Take 40 mg by mouth daily.    [provider]  oxyCODONE (ROXICODONE) 5 MG immediate release tablet Take 1 tablet (5 mg total) by mouth every 6 (six) hours as needed for up to 12 doses for severe pain. 06/09/20   Breck Coons, MD  Oxycodone HCl 20 MG TABS Take 1 tablet by mouth every 6 (six) hours as needed. 01/24/20   [provider]  OXYCONTIN 20 MG 12 hr tablet SMARTSIG:1 Tablet(s) By Mouth Every 12 Hours 01/24/20   [provider]  rosuvastatin (CRESTOR) 40 MG tablet Take 40 mg by mouth every evening.     [provider]  tamsulosin (FLOMAX) 0.4 MG CAPS capsule Take 0.4 mg by mouth daily.    [provider]    Allergies    Cefepime, Iohexol, Fentanyl, and Iodine  Review of Systems   Review of  Systems  Constitutional: Negative for chills and fever.  HENT: Negative.   Respiratory: Negative.  Negative for shortness of breath.   Cardiovascular: Negative.  Negative for chest pain.  Gastrointestinal: Positive for diarrhea and vomiting.       C/o melena x 2-3 days.  Genitourinary: Positive for decreased urine volume.  Musculoskeletal: Positive for back pain.  Skin: Negative.   Neurological: Positive for weakness.    Physical Exam Updated Vital Signs BP (!) 149/96 (BP Location: Right Arm)   Pulse (!) 104   Temp 98.4 F (36.9 C) (Oral)   Resp (!) 21   Ht _0  (1.778 m)   Wt 113.4 kg  SpO2 98%   BMI 35.87 kg/m   Physical Exam Constitutional:      Appearance: Normal appearance. He is well-developed. He is obese. He is ill-appearing. He is not toxic-appearing.  HENT:     Head: Normocephalic and atraumatic.     Mouth/Throat:     Mouth: Mucous membranes are dry.  Eyes:     Comments: No conjunctival pallor.  Cardiovascular:     Rate and Rhythm: Regular rhythm. Tachycardia present.     Heart sounds: No murmur heard.   Pulmonary:     Effort: Pulmonary effort is normal.     Breath sounds: Normal breath sounds. No wheezing, rhonchi or rales.  Abdominal:     General: There is no distension.     Palpations: Abdomen is soft.     Tenderness: There is no abdominal tenderness. There is no guarding or rebound.  Genitourinary:    Penis: Normal.      Comments: Stool is dark in appearance.  Musculoskeletal:        General: Normal range of motion.     Cervical back: Normal range of motion and neck supple.  Skin:    General: Skin is warm and dry.     Findings: No rash.  Neurological:     Mental Status: He is alert and oriented to person, place, and time.     ED Results / Procedures / Treatments   Labs (all labs ordered are listed, but only abnormal results are displayed) Labs Reviewed  LIPASE, BLOOD  COMPREHENSIVE METABOLIC PANEL  CBC  URINALYSIS, ROUTINE W REFLEX  MICROSCOPIC  POC OCCULT BLOOD, ED   Results for orders placed or performed during the hospital encounter of 06/27/20  Culture, blood (routine x 2)   Specimen: BLOOD  Result Value Ref Range   Specimen Description      BLOOD RIGHT ANTECUBITAL Performed at [facility], [place] [address], [place], [place] [address]    Special Requests      BOTTLES DRAWN AEROBIC AND ANAEROBIC Blood Culture adequate volume Performed at [facility] [address] [place], [place] [address]    Culture PENDING    Report Status PENDING   Lipase, blood  Result Value Ref Range   Lipase 41 11 - 51 U/L  Comprehensive metabolic panel  Result Value Ref Range   Sodium 133 (L) 135 - 145 mmol/L   Potassium 3.4 (L) 3.5 - 5.1 mmol/L   Chloride 97 (L) 98 - 111 mmol/L   CO2 18 (L) 22 - 32 mmol/L   Glucose, Bld 189 (H) 70 - 99 mg/dL   BUN 40 (H) 8 - 23 mg/dL   Creatinine, Ser 1.87 (H) 0.61 - 1.24 mg/dL   Calcium 10.1 8.9 - 10.3 mg/dL   Total Protein 8.5 (H) 6.5 - 8.1 g/dL   Albumin 5.2 (H) 3.5 - 5.0 g/dL   AST 24 15 - 41 U/L   ALT 23 0 - 44 U/L   Alkaline Phosphatase 70 38 - 126 U/L   Total Bilirubin 0.6 0.3 - 1.2 mg/dL   GFR calc non Af Amer 33 (L) >60 mL/min   GFR calc Af Amer 39 (L) >60 mL/min   Anion gap 18 (H) 5 - 15  CBC  Result Value Ref Range   WBC 27.9 (H) 4.0 - 10.5 K/uL   RBC 4.83 4.22 - 5.81 MIL/uL   Hemoglobin 14.3 13.0 - 17.0 g/dL   HCT 41.9 39 - 52 %   MCV  86.7 80.0 - 100.0 fL   MCH 29.6 26.0 - 34.0 pg   MCHC 34.1 30.0 - 36.0 g/dL   RDW 14.6 11.5 - 15.5 %   Platelets 513 (H) 150 - 400 K/uL   nRBC 0.0 0.0 - 0.2 %  Lactic acid, plasma  Result Value Ref Range   Lactic Acid, Venous 2.0 (HH) 0.5 - 1.9 mmol/L  POC occult blood, ED Provider will collect  Result Value Ref Range   Fecal Occult Bld NEGATIVE NEGATIVE    EKG None  Radiology DG Chest Portable 1 View  Result Date: 06/25/2020 CLINICAL DATA:  Chest pain, diarrhea for 2 weeks EXAM: PORTABLE CHEST 1 VIEW  COMPARISON:  Radiograph 08/31/2019 FINDINGS: Low lung volumes with chronic mild elevation of the right hemidiaphragm. No consolidation, features of edema, pneumothorax, or effusion. Stable cardiomediastinal contours. No acute osseous or soft tissue abnormality. Degenerative changes are present in the imaged spine and shoulders. Features are quite severe in the left glenohumeral joint, similar to comparison. IMPRESSION: 1. Low lung volumes with chronic mild elevation of the right hemidiaphragm. 2. No acute cardiopulmonary findings. Electronically Signed   By: Lovena Le M.D.   On: 06/25/2020 14:55    Procedures Procedures (including critical care time)  Medications Ordered in ED Medications  sodium chloride flush (NS) 0.9 % injection 3 mL (has no administration in time range)    ED Course  I have reviewed the triage vital signs and the nursing notes.  Pertinent labs & imaging results that were available during my care of the patient were reviewed by me and considered in my medical decision making (see chart for details).    MDM Rules/Calculators/A&P                          Patient with back pain, progressive weakness, diarrhea returns to ED for further evaluation.   The patient is not septic appearing. No confusion, nontoxic. Borderline lactic acid of 2.0 with low grade temp of 100.2, mild tachycardia. He has a new AKI and is acidotic with Anion gap of 18, CO2 18. No breathing difficulty, CXR clear.   He will need to be admitted for likely dehydration secondary to diarrhea x 2 weeks. UA remains pending as patient cannot produce specimen despite encouragement. He declines to be cathed. Discussed with the hospitalist who accepts for admission.  Returns to ED 8/6 after multiple recent visits. Feel that the patient is having increasing difficulty caring for himself at home. He is interested in permanent SNF placement.    Final Clinical Impression(s) / ED Diagnoses Final diagnoses:  None     1. AKI 2. Diarrhea 3. Leukocytosis   Rx / DC Orders ED Discharge Orders    None       Dennie Bible 06/27/20 1258    Daleen Bo, MD 06/27/20 1511

## 2020-06-27 NOTE — ED Notes (Addendum)
Phlebotomy attempt unsuccessful because of the pt's inability to sit still.

## 2020-06-27 NOTE — ED Notes (Signed)
Date and time results received: 06/27/20 11:15 AM (use smartphrase ".now" to insert current time)  Test: lactic acid  Critical Value: 2.0 Name of Provider Notified: Wentz/Uphill  Orders Received? Or Actions Taken?:

## 2020-06-27 NOTE — ED Provider Notes (Signed)
  Face-to-face evaluation   History: Patient presents for "dehydration."  He feels like he is dehydrated because he has been vomiting and having diarrhea.  He also complains of ongoing back pain and would like some pain medicine.  He has not followed up with pain management, yet.  Physical exam: Obese elderly man who is alert and cooperative.  He speaks slowly but there is no dysarthria or aphasia.  No respiratory distress.  Abdomen is not distended.  Medical screening examination/treatment/procedure(s) were conducted as a shared visit with non-physician practitioner(s) and myself.  I personally evaluated the patient during the encounter   Daleen Bo, MD 06/27/20 1512

## 2020-06-27 NOTE — ED Notes (Signed)
Pt provided with heat packs for his lower back. He continues to yell out in pain in the lobby.

## 2020-06-27 NOTE — H&P (Signed)
[facility] History and Physical  CAYNE YOM WUJ:[id number] DOB: 18-Jul-1941 DOA: 06/27/2020   PCP: Merrilee Seashore, MD  Specialists: None  Chief Complaint: Acute diarrhea  HPI: Jorge Sherman is a 79 y.o. male with a past medical history of essential hypertension, chronic back pain who has presented to the ED numerous times in the last few weeks for his pain issues.  Today however he came in complaining of feeling "dehydrated".  Apparently has been having some nausea vomiting and has had multiple episodes of loose stools in the last couple of weeks.  He admits to having black stools but at the same time he states that he has been taking Pepto-Bismol.  Denies any red blood in the stool.  No blood in the emesis.  Has some abdominal pain in the left lower side.  Denies any fever or chills.  No shortness of breath.  He does have chronic back pain and is requesting Toradol.  He was told that his creatinine is elevated and he cannot be given Toradol as it can further harm his kidneys.  Not a very good historian.  In the emergency department he was found to have acute renal failure.  Had significant leukocytosis and fever with concern for sepsis.  Stool was negative for blood. He will need hospitalization for further management.  Home Medications: Prior to Admission medications   Medication Sig Start Date End Date Taking? Authorizing Provider  amLODipine (NORVASC) 2.5 MG tablet Take 2.5 mg by mouth daily.    [provider]  Buprenorphine HCl-Naloxone HCl 8-2 MG FILM Place under the tongue 3 (three) times daily. 04/09/20   [provider]  celecoxib (CELEBREX) 200 MG capsule Take 200 mg by mouth 2 (two) times daily.    [provider]  cloNIDine (CATAPRES) 0.1 MG tablet Take 0.1 mg by mouth 2 (two) times daily.     [provider]  DULoxetine (CYMBALTA) 30 MG capsule Take 30 mg by mouth 3 (three) times daily.     [provider]  ferrous  sulfate 325 (65 FE) MG tablet Take 325 mg by mouth daily.     [provider]  furosemide (LASIX) 20 MG tablet Take 20 mg by mouth daily as needed. 01/20/20   [provider]  gabapentin (NEURONTIN) 300 MG capsule Take 2 capsules (600 mg total) by mouth 3 (three) times daily. 08/30/19   Nita Sells, MD  gabapentin (NEURONTIN) 800 MG tablet Take 800 mg by mouth 3 (three) times daily. 06/10/20   [provider]  hydroxychloroquine (PLAQUENIL) 200 MG tablet Take 200 mg by mouth 2 (two) times daily.    [provider]  ipratropium-albuterol (DUONEB) 0.5-2.5 (3) MG/3ML SOLN SMARTSIG:1 Vial(s) Via Nebulizer PRN 02/13/20   [provider]  Iron-FA-B Cmp-C-Biot-Probiotic (FUSION PLUS PO) Take 1 capsule by mouth daily.    [provider]  ketorolac (TORADOL) 10 MG tablet Take 10 mg by mouth every 6 (six) hours as needed. 06/15/20   [provider]  Lidocaine-Menthol 3.6-1.25 % PTCH Place onto the skin. 01/02/20   [provider]  linaclotide (LINZESS) 72 MCG capsule Take 72 mcg by mouth daily before breakfast.    [provider]  LORazepam (ATIVAN) 1 MG tablet Take 2 mg by mouth every 6 (six) hours as needed for anxiety.     [provider]  methocarbamol (ROBAXIN) 500 MG tablet Take 1,000 mg by mouth 3 (three) times daily. 06/15/20   [provider]  NARCAN 4 MG/0.1ML LIQD nasal spray kit SMARTSIG:1 Spray(s) Both Nares 01/27/20   [provider]  omeprazole (PRILOSEC) 40 MG capsule Take 40 mg by mouth daily.    [provider]  oxyCODONE (ROXICODONE) 5 MG immediate release tablet Take 1 tablet (5 mg total) by mouth every 6 (six) hours as needed for up to 12 doses for severe pain. 06/09/20   Breck Coons, MD  Oxycodone HCl 20 MG TABS Take 1 tablet by mouth every 6 (six) hours as needed. 01/24/20   [provider]  OXYCONTIN 20 MG 12 hr tablet SMARTSIG:1 Tablet(s) By Mouth Every 12 Hours  01/24/20   [provider]  rosuvastatin (CRESTOR) 40 MG tablet Take 40 mg by mouth every evening.     [provider]  tamsulosin (FLOMAX) 0.4 MG CAPS capsule Take 0.4 mg by mouth daily.    [provider]    Allergies:  Allergies  Allergen Reactions  . Cefepime Rash  . Iohexol Hives and Other (See Comments)    A few hives post CT  . Fentanyl Itching, Nausea And Vomiting and Other (See Comments)    Patient states that it happened over 30 yrs ago  . Iodine Rash    Past Medical History: Past Medical History:  Diagnosis Date  . Chronic pain   . Depressive disorder, not elsewhere classified   . Dizziness and giddiness   . Essential and other specified forms of tremor   . Headache(784.0)   . Leukocytosis, unspecified   . Obstructive sleep apnea (adult) (pediatric)   . Pain in joint, lower leg   . Septic joint of left knee joint ([place]) 04/22/2019  . Spinal stenosis, lumbar region, without neurogenic claudication   . Thoracic or lumbosacral neuritis or radiculitis, unspecified   . Type II or unspecified type diabetes mellitus without mention of complication, not stated as uncontrolled   . Unspecified cardiovascular disease   . Unspecified essential hypertension     Past Surgical History:  Procedure Laterality Date  . BACK SURGERY    . back surgey     times 3    Social History: Lives in a condominium by himself but he tells me that he has caregivers who come home to take care of him.  No smoking or alcohol use.   Family History:  Family History  Problem Relation Age of Onset  . Cancer Father   . Heart disease Father   . Stroke Mother   . Diabetes Mother      Review of Systems - History obtained from the patient General ROS: positive for  - fatigue Psychological ROS: negative Ophthalmic ROS: negative ENT ROS: negative Allergy and Immunology ROS: negative Hematological and Lymphatic ROS: negative Endocrine ROS: negative Respiratory ROS: no  cough, shortness of breath, or wheezing Cardiovascular ROS: no chest pain or dyspnea on exertion Gastrointestinal ROS: As in HPI Genito-Urinary ROS: no dysuria, trouble voiding, or hematuria Musculoskeletal ROS: Chronic back pain Neurological ROS: no TIA or stroke symptoms Dermatological ROS: negative  Physical Examination  Vitals:   06/27/20 1043 06/27/20 1236 06/27/20 1430 06/27/20 1513  BP:  (!) 185/89 (!) 165/75 (!) 127/57  Pulse:  (!) 102 98 90  Resp:  (!) 25 18 17   Temp: (!) 100.6 F (38.1 C)   98.3 F (36.8 C)  TempSrc: Rectal   Oral  SpO2:  96% 98% 98%  Weight:      Height:        BP (!) 127/57 (  BP Location: Left Arm)   Pulse 90   Temp 98.3 F (36.8 C) (Oral)   Resp 17   Ht 5' 10"  (1.778 m)   Wt 113.4 kg   SpO2 98%   BMI 35.87 kg/m   General appearance: alert, cooperative, appears stated age, distracted, no distress and Anxious Head: Normocephalic, without obvious abnormality, atraumatic Eyes: conjunctivae/corneas clear. PERRL, EOM's intact.  Throat: lips, mucosa, and tongue normal; teeth and gums normal Neck: no adenopathy, no carotid bruit, no JVD, supple, symmetrical, trachea midline and thyroid not enlarged, symmetric, no tenderness/mass/nodules Resp: clear to auscultation bilaterally Cardio: regular rate and rhythm, S1, S2 normal, no murmur, click, rub or gallop GI: Abdomen soft.  Mildly tender in the left lower quadrant without any rebound rigidity or guarding.  No masses organomegaly.  Bowel sounds are present normal. Extremities: extremities normal, atraumatic, no cyanosis or edema Pulses: 2+ and symmetric Skin: Skin color, texture, turgor normal. No rashes or lesions Lymph nodes: Cervical, supraclavicular, and axillary nodes normal. Neurologic: No focal neurological deficits.  Seems distracted at times.    Labs on Admission: I have personally reviewed following labs and imaging studies  CBC: Recent Labs  Lab 06/27/20 0816  WBC 27.9*    NEUTROABS 23.9*  HGB 14.3  HCT 41.9  MCV 86.7  PLT 403*   Basic Metabolic Panel: Recent Labs  Lab 06/27/20 0816  NA 133*  K 3.4*  CL 97*  CO2 18*  GLUCOSE 189*  BUN 40*  CREATININE 1.87*  CALCIUM 10.1   GFR: Estimated Creatinine Clearance: 40.4 mL/min (A) (by C-G formula based on SCr of 1.87 mg/dL (H)). Liver Function Tests: Recent Labs  Lab 06/27/20 0816  AST 24  ALT 23  ALKPHOS 70  BILITOT 0.6  PROT 8.5*  ALBUMIN 5.2*   Recent Labs  Lab 06/27/20 0816  LIPASE 41    Radiological Exams on Admission: DG Chest Portable 1 View  Result Date: 06/27/2020 CLINICAL DATA:  Weakness. Arrives by EMS from home. Nausea, vomiting, diarrhea for 2-3 days. EXAM: PORTABLE CHEST 1 VIEW COMPARISON:  06/25/2020 FINDINGS: The heart size and mediastinal contours are within normal limits. Both lungs are clear. Chronic changes in both shoulders and AC joints. IMPRESSION: No active disease. Electronically Signed   By: Nolon Nations M.D.   On: 06/27/2020 11:24      Problem List  Principal Problem:   AKI (acute kidney injury) ([place]) Active Problems:   Chronic pain   Essential hypertension, benign   Acute diarrhea   Assessment: This is a 79 year old Caucasian male with a past medical history as stated earlier who also appears to be noncompliant.  He presents with a 2-week history of loose stools.  He also has chronic back pain.  Keeps requesting Toradol despite being told why he cannot get it.  He is noted to have acute renal failure.  He is mildly tender in his abdomen however abdomen is benign for the most part.  Need to rule out infectious etiology.  Plan:  1. Acute kidney injury with metabolic acidosis in the setting of acute diarrhea: This is most likely due to hypovolemia.  We will give him IV fluids.  Monitor urine output.  UA does not show any concerning findings except for mild proteinuria.  2.  Hyponatremia and hypokalemia: Most likely due to GI loss.  We will check  magnesium level.  Replace potassium through IV.  Recheck labs tomorrow.  3.  Acute diarrhea with significant leukocytosis/mild lactic acidosis: Patient has  WBC of 27.9.  He had a fever of 100.6.  Possible source of infection is his GI tract.  He has sepsis present on admission.  C. difficile is a consideration.  We will send stool sample for same.  GI pathogen panel.  Hold off on antibacterials for now.  Repeat lactic acid level.  4.  History of chronic pain syndrome with chronic back pain: Patient keeps requesting Toradol despite being told that he has a contraindication for the same.  He will be placed on narcotics for now close monitoring.  Pain is chronic.  He does not have any neurological deficits.  5.  Accelerated hypertension: Blood pressure noted to be significantly elevated.  Medication list does show amlodipine and clonidine but does not appear that he has filled these recently.  We will resume his amlodipine.  Hold off on resuming clonidine for now.  6. History of BPH: Continue with Flomax.    DVT Prophylaxis: Subcutaneous heparin Code Status: Full code Family Communication: No family at bedside Disposition: Hopefully return home when improved.  PT and OT evaluation Consults called: None Admission Status: Status is: Inpatient  Remains inpatient appropriate because:Ongoing active pain requiring inpatient pain management, IV treatments appropriate due to intensity of illness or inability to take PO and Inpatient level of care appropriate due to severity of illness   Dispo: The patient is from: Home              Anticipated d/c is to: Home              Anticipated d/c date is: 2 days              Patient currently is not medically stable to d/c.   Severity of Illness: The appropriate patient status for this patient is INPATIENT. Inpatient status is judged to be reasonable and necessary in order to provide the required intensity of service to ensure the patient's safety. The  patient's presenting symptoms, physical exam findings, and initial radiographic and laboratory data in the context of their chronic comorbidities is felt to place them at high risk for further clinical deterioration. Furthermore, it is not anticipated that the patient will be medically stable for discharge from the hospital within 2 midnights of admission. The following factors support the patient status of inpatient.   " The patient's presenting symptoms include acute diarrhea. " The worrisome physical exam findings include fever. " The initial radiographic and laboratory data are worrisome because of acute renal failure. " The chronic co-morbidities include hypertension.   * I certify that at the point of admission it is my clinical judgment that the patient will require inpatient hospital care spanning beyond 2 midnights from the point of admission due to high intensity of service, high risk for further deterioration and high frequency of surveillance required.*    Further management decisions will depend on results of further testing and patient's response to treatment.   Lindwood Mogel [employer]  [facility] operational officer on [email or web address]  06/27/2020, 3:31 PM

## 2020-06-28 ENCOUNTER — Inpatient Hospital Stay (HOSPITAL_COMMUNITY): Payer: Medicare Other

## 2020-06-28 DIAGNOSIS — K567 Ileus, unspecified: Secondary | ICD-10-CM

## 2020-06-28 DIAGNOSIS — I10 Essential (primary) hypertension: Secondary | ICD-10-CM | POA: Diagnosis not present

## 2020-06-28 DIAGNOSIS — N179 Acute kidney failure, unspecified: Secondary | ICD-10-CM | POA: Diagnosis not present

## 2020-06-28 DIAGNOSIS — G894 Chronic pain syndrome: Secondary | ICD-10-CM | POA: Diagnosis not present

## 2020-06-28 DIAGNOSIS — R197 Diarrhea, unspecified: Secondary | ICD-10-CM | POA: Diagnosis not present

## 2020-06-28 LAB — BASIC METABOLIC PANEL
Anion gap: 7 (ref 5–15)
BUN: 30 mg/dL — ABNORMAL HIGH (ref 8–23)
CO2: 24 mmol/L (ref 22–32)
Calcium: 8.6 mg/dL — ABNORMAL LOW (ref 8.9–10.3)
Chloride: 104 mmol/L (ref 98–111)
Creatinine, Ser: 1.03 mg/dL (ref 0.61–1.24)
GFR calc Af Amer: >60 mL/min (ref >60)
GFR calc non Af Amer: >60 mL/min (ref >60)
Glucose, Bld: 144 mg/dL — ABNORMAL HIGH (ref 70–99)
Potassium: 4.1 mmol/L (ref 3.5–5.1)
Sodium: 135 mmol/L (ref 135–145)

## 2020-06-28 LAB — COMPREHENSIVE METABOLIC PANEL
ALT: 17 U/L (ref 0–44)
AST: 21 U/L (ref 15–41)
Albumin: 3.7 g/dL (ref 3.5–5.0)
Alkaline Phosphatase: 52 U/L (ref 38–126)
Anion gap: 9 (ref 5–15)
BUN: 28 mg/dL — ABNORMAL HIGH (ref 8–23)
CO2: 21 mmol/L — ABNORMAL LOW (ref 22–32)
Calcium: 8.3 mg/dL — ABNORMAL LOW (ref 8.9–10.3)
Chloride: 97 mmol/L — ABNORMAL LOW (ref 98–111)
Creatinine, Ser: 1.14 mg/dL (ref 0.61–1.24)
GFR calc Af Amer: >60 mL/min (ref >60)
GFR calc non Af Amer: >60 mL/min (ref >60)
Glucose, Bld: 140 mg/dL — ABNORMAL HIGH (ref 70–99)
Potassium: 3.6 mmol/L (ref 3.5–5.1)
Sodium: 127 mmol/L — ABNORMAL LOW (ref 135–145)
Total Bilirubin: 0.4 mg/dL (ref 0.3–1.2)
Total Protein: 6.6 g/dL (ref 6.5–8.1)

## 2020-06-28 LAB — CBC
HCT: 34.8 % — ABNORMAL LOW (ref 39.0–52.0)
Hemoglobin: 11.4 g/dL — ABNORMAL LOW (ref 13.0–17.0)
MCH: 29.8 pg (ref 26.0–34.0)
MCHC: 32.8 g/dL (ref 30.0–36.0)
MCV: 90.9 fL (ref 80.0–100.0)
Platelets: 399 10*3/uL (ref 150–400)
RBC: 3.83 MIL/uL — ABNORMAL LOW (ref 4.22–5.81)
RDW: 15 % (ref 11.5–15.5)
WBC: 20.2 10*3/uL — ABNORMAL HIGH (ref 4.0–10.5)
nRBC: 0 % (ref 0.0–0.2)

## 2020-06-28 LAB — MAGNESIUM: Magnesium: 2.5 mg/dL — ABNORMAL HIGH (ref 1.7–2.4)

## 2020-06-28 MED ORDER — PANTOPRAZOLE SODIUM 40 MG PO TBEC: 40.0000 mg | DELAYED_RELEASE_TABLET | Freq: Every day | ORAL | Status: DC

## 2020-06-28 MED ORDER — PANTOPRAZOLE SODIUM 40 MG PO TBEC
40.0000 mg | DELAYED_RELEASE_TABLET | Freq: Every day | ORAL | Status: DC
  Administered 2020-06-28 – 2020-07-06 (×9): 40 mg via ORAL
  Filled 2020-06-28 (×9): qty 1

## 2020-06-28 MED ORDER — POTASSIUM CHLORIDE IN NACL 20-0.9 MEQ/L-% IV SOLN
INTRAVENOUS | Status: DC
  Filled 2020-06-28 (×9): qty 1000

## 2020-06-28 MED ORDER — GABAPENTIN 400 MG PO CAPS
800.0000 mg | ORAL_CAPSULE | Freq: Three times a day (TID) | ORAL | Status: DC
  Administered 2020-06-28 – 2020-07-06 (×25): 800 mg via ORAL
  Filled 2020-06-28 (×25): qty 2

## 2020-06-28 MED ORDER — KETOROLAC TROMETHAMINE 10 MG PO TABS
10.0000 mg | ORAL_TABLET | Freq: Once | ORAL | Status: AC
  Administered 2020-06-28: 10 mg via ORAL
  Filled 2020-06-28: qty 1

## 2020-06-28 NOTE — Progress Notes (Signed)
PROGRESS NOTE    JILES GOYA   GGY:[id number]  DOB: Aug 10, 1941  DOA: 06/27/2020 PCP: Merrilee Seashore, MD   Brief Narrative:  OCTAVIUS SHIN is a 79 year old male with essential hypertension, chronic back pain related to spinal stenosis, rheumatoid arthritis, OSA noncompliant with CPAP on numerous medications who lives alone and has private caretakers and presents to the hospital EMS for nausea vomiting and diarrhea for a number of days. In the ED> stated that he felt dehydrated and complained of ongoing chronic back pain skin for his Toradol.  He had episode of diarrhea in the ED. Tachycardic with a temperature of 100.6 rectally. Creatinine was noted to be elevated from his baseline and was 1.87 with a BUN of 40, sodium 133, potassium 3.4 and chloride 97. White blood cell count was 27.9 and platelets were 513. The patient was started on IV fluids for dehydration.  Stool studies were ordered.  Subjective: Patient is upset because he has back pain and feels that he has not received his home pain medications yet specifically his Toradol.  I have explained that his Toradol was held as his creatinine was elevated and it was not safe to be given yesterday.  He has already received gabapentin and Robaxin this morning which are his home meds. The patient states that he has not had a bowel movement since being in the ER yesterday morning.  He has not vomited since coming to the hospital.  He has not noted that he has been having any fevers.    Assessment & Plan:   Principal Problem:   AKI (acute kidney injury)  -Creatinine has improved with IV fluids-continue IV fluids until he is able to tolerate a diet -As his creatinine has improved, I will allow him to have 1 dose of oral Toradol 10 mg which he takes at home -Hold off on giving him further Toradol and follow-up on creatinine tomorrow morning  Active Problems:   Acute vomiting and diarrhea - colonic ileus - Abdomen is distended and  tympanic today-bowel sounds are high-pitched -abdominal x-ray reveals a colonic ileus which is likely secondary to underlying infection. -Awaiting for him to have a bowel movement so that it can be sent to the lab to check his stool studies -N.p.o. except for of water, meds and ice chips for now -Limit narcotics    Chronic pain -I have gone over his list of home medications with him-he states he takes gabapentin 800 mg 3 times daily, Robaxin, Cymbalta, and oxycodone-there are 2 doses of oxycodone written on his med rec which are 5 mg in a 20 mg but the patient states he takes 10 mg of oxycodone    Essential hypertension, benign -Continue amlodipine which is his home medication -As needed IV hydralazine has also been added  BPH -Continue Flomax daily  Functional paraplegia -The patient is wheelchair-bound and is able to transfer on his own but is not able to walk - he has a motorized "cart" at home and private aids during the day   Time spent in minutes: 35 DVT prophylaxis: Heparin Code Status: Full code Family Communication:  Disposition Plan:  Status is: Inpatient  Remains inpatient appropriate because:ileus, vomiting, diarhea and dehydration   Dispo: The patient is from: Home              Anticipated d/c is to: TBD              Anticipated d/c date is: > 3 days  Patient currently is not medically stable to d/c.  Consultants:   none Procedures:   none Antimicrobials:  Anti-infectives (From admission, onward)   None       Objective: Vitals:   06/27/20 1430 06/27/20 1513 06/27/20 2103 06/28/20 0521  BP: (!) 165/75 (!) 127/57 (!) 178/90 (!) 152/75  Pulse: 98 90 100 92  Resp: 18 17 18 17   Temp:  98.3 F (36.8 C) 98.4 F (36.9 C) 98.3 F (36.8 C)  TempSrc:  Oral Oral Oral  SpO2: 98% 98% 99% 96%  Weight:      Height:        Intake/Output Summary (Last 24 hours) at 06/28/2020 1220 Last data filed at 06/28/2020 0957 Gross per 24 hour  Intake  1185.56 ml  Output 400 ml  Net 785.56 ml   Filed Weights   06/26/20 1750  Weight: 113.4 kg    Examination: General exam: Appears comfortable  HEENT: PERRLA, oral mucosa moist, no sclera icterus or thrush Respiratory system: Clear to auscultation. Respiratory effort normal. Cardiovascular system: S1 & S2 heard, RRR.   Gastrointestinal system: Abdomen soft, distended, tympanic bowel sounds tender in lower abdomen,.   Central nervous system: Alert and oriented. No focal neurological deficits. Extremities: No cyanosis, clubbing or edema Skin: - numerous bruises and scabs on lower extermities Psychiatry:  Mood & affect appropriate.     Data Reviewed: I have personally reviewed following labs and imaging studies  CBC: Recent Labs  Lab 06/27/20 0816 06/28/20 0757  WBC 27.9* 20.2*  NEUTROABS 23.9*  --   HGB 14.3 11.4*  HCT 41.9 34.8*  MCV 86.7 90.9  PLT 513* 174   Basic Metabolic Panel: Recent Labs  Lab 06/27/20 0816 06/28/20 0757  NA 133* 127*  K 3.4* 3.6  CL 97* 97*  CO2 18* 21*  GLUCOSE 189* 140*  BUN 40* 28*  CREATININE 1.87* 1.14  CALCIUM 10.1 8.3*  MG  --  2.5*   GFR: Estimated Creatinine Clearance: 66.3 mL/min (by C-G formula based on SCr of 1.14 mg/dL). Liver Function Tests: Recent Labs  Lab 06/27/20 0816 06/28/20 0757  AST 24 21  ALT 23 17  ALKPHOS 70 52  BILITOT 0.6 0.4  PROT 8.5* 6.6  ALBUMIN 5.2* 3.7   Recent Labs  Lab 06/27/20 0816  LIPASE 41   No results for input(s): AMMONIA in the last 168 hours. Coagulation Profile: No results for input(s): INR, PROTIME in the last 168 hours. Cardiac Enzymes: No results for input(s): CKTOTAL, CKMB, CKMBINDEX, TROPONINI in the last 168 hours. BNP (last 3 results) No results for input(s): PROBNP in the last 8760 hours. HbA1C: No results for input(s): HGBA1C in the last 72 hours. CBG: No results for input(s): GLUCAP in the last 168 hours. Lipid Profile: No results for input(s): CHOL, HDL,  LDLCALC, TRIG, CHOLHDL, LDLDIRECT in the last 72 hours. Thyroid Function Tests: No results for input(s): TSH, T4TOTAL, FREET4, T3FREE, THYROIDAB in the last 72 hours. Anemia Panel: No results for input(s): VITAMINB12, FOLATE, FERRITIN, TIBC, IRON, RETICCTPCT in the last 72 hours. Urine analysis:    Component Value Date/Time   COLORURINE YELLOW 06/26/2020 [place] 06/26/2020 1757   LABSPEC 1.020 06/26/2020 1757   PHURINE 5.0 06/26/2020 1757   GLUCOSEU NEGATIVE 06/26/2020 1757   HGBUR NEGATIVE 06/26/2020 1757   BILIRUBINUR NEGATIVE 06/26/2020 1757   KETONESUR 5 (A) 06/26/2020 1757   PROTEINUR 100 (A) 06/26/2020 1757   NITRITE NEGATIVE 06/26/2020 1757   LEUKOCYTESUR NEGATIVE 06/26/2020  1757   Sepsis Labs: @LABRCNTIP (procalcitonin:4,lacticidven:4) ) Recent Results (from the past 240 hour(s))  Culture, blood (routine x 2)     Status: None (Preliminary result)   Collection Time: 06/27/20 10:27 AM   Specimen: BLOOD  Result Value Ref Range Status   Specimen Description   Final    BLOOD RIGHT ANTECUBITAL Performed at [facility], [place] [address] [place], [place] [address]    Special Requests   Final    BOTTLES DRAWN AEROBIC AND ANAEROBIC Blood Culture adequate volume Performed at [place] [address] [place], [place] [address]    Culture   Final    NO GROWTH < 24 HOURS Performed at [facility] [address], [place], [place] [address]    Report Status PENDING  Incomplete  SARS Coronavirus 2 by RT PCR (hospital order, performed in [facility] hospital lab) Nasopharyngeal Nasopharyngeal Swab     Status: None   Collection Time: 06/27/20  1:39 PM   Specimen: Nasopharyngeal Swab  Result Value Ref Range Status   SARS Coronavirus 2 NEGATIVE NEGATIVE Final    Comment: (NOTE) SARS-CoV-2 target nucleic acids are NOT DETECTED.  The SARS-CoV-2 RNA is generally detectable in upper and lower respiratory specimens during the acute  phase of infection. The lowest concentration of SARS-CoV-2 viral copies this assay can detect is 250 copies / mL. A negative result does not preclude SARS-CoV-2 infection and should not be used as the sole basis for treatment or other patient management decisions.  A negative result may occur with improper specimen collection / handling, submission of specimen other than nasopharyngeal swab, presence of viral mutation(s) within the areas targeted by this assay, and inadequate number of viral copies (<250 copies / mL). A negative result must be combined with clinical observations, patient history, and epidemiological information.  Fact Sheet for Patients:   StrictlyIdeas.no  Fact Sheet for Healthcare Providers: BankingDealers.co.za  This test is not yet approved or  cleared by the [place] FDA and has been authorized for detection and/or diagnosis of SARS-CoV-2 by FDA under an Emergency Use Authorization (EUA).  This EUA will remain in effect (meaning this test can be used) for the duration of the COVID-19 declaration under Section 564(b)(1) of the Act, 21 U.S.C. section 360bbb-3(b)(1), unless the authorization is terminated or revoked sooner.  Performed at [facility], [place] [address], [place], [place] [address]          Radiology Studies: DG Chest Portable 1 View  Result Date: 06/27/2020 CLINICAL DATA:  Weakness. Arrives by EMS from home. Nausea, vomiting, diarrhea for 2-3 days. EXAM: PORTABLE CHEST 1 VIEW COMPARISON:  06/25/2020 FINDINGS: The heart size and mediastinal contours are within normal limits. Both lungs are clear. Chronic changes in both shoulders and AC joints. IMPRESSION: No active disease. Electronically Signed   By: Nolon Nations M.D.   On: 06/27/2020 11:24   DG Abd Portable 2V  Result Date: 06/28/2020 CLINICAL DATA:  Generalized abdominal pain and distention EXAM: PORTABLE ABDOMEN - 2  VIEW COMPARISON:  08/05/2019 CT abdomen/pelvis FINDINGS: Diffuse prominent gaseous distention of the colon. No disproportionately dilated small bowel loops. No evidence of pneumatosis or pneumoperitoneum. Bilateral posterior spinal fusion hardware in the lower lumbar spine with post vertebroplasty change at L1. IMPRESSION: Diffuse prominent gaseous distention of the colon, favor colonic ileus. Electronically Signed   By: Ilona Sorrel M.D.   On: 06/28/2020 12:15      Scheduled Meds: .  amLODipine  5 mg Oral Daily  . gabapentin  800 mg Oral TID  . heparin  5,000 Units Subcutaneous Q8H  . ketorolac  10 mg Oral Once  . pantoprazole  40 mg Oral Daily  . rosuvastatin  40 mg Oral QPM  . sodium chloride flush  3 mL Intravenous Once  . tamsulosin  0.4 mg Oral Daily   Continuous Infusions: . 0.9 % NaCl with KCl 20 mEq / L 100 mL/hr at 06/27/20 1750     LOS: 1 day      Debbe Odea, MD [facility] Pager: [email or web address] 06/28/2020, 12:20 PM

## 2020-06-28 NOTE — Evaluation (Signed)
Physical Therapy Evaluation Patient Details Name: Jorge Sherman MRN: [id number] DOB: 1941/04/19 Today's Date: 06/28/2020   History of Present Illness  Pt admitted with N/V/diarrhea and weaknessand now with acute renal failure, Hyponatremia, and Hypokalemia.  Pt with hx of DM, spinal stenosis and back surgery x 3  Clinical Impression  Pt admitted as above and presenting with functional mobility limitations 2* generalized weakness, obesity, chronic pain and premorbid deconditioning.  Pt would benefit from follow up rehab at SNF level to maximize IND and safety - pt states "I have been before and it might be the best thing for me"    Follow Up Recommendations SNF    Equipment Recommendations  None recommended by PT    Recommendations for Other Services       Precautions / Restrictions Precautions Precautions: Fall Restrictions Weight Bearing Restrictions: No      Mobility  Bed Mobility               General bed mobility comments: Pt refusing to attempt to move from supine  Transfers                 General transfer comment: Pt refused  Ambulation/Gait                Stairs            Wheelchair Mobility    Modified Rankin (Stroke Patients Only)       Balance                                             Pertinent Vitals/Pain Pain Assessment: Faces Faces Pain Scale: Hurts even more Pain Location: bil shoulders/ back Pain Descriptors / Indicators: Guarding;Grimacing Pain Intervention(s): Limited activity within patient's tolerance;Monitored during session    Home Living Family/patient expects to be discharged to:: Private residence Living Arrangements: Alone Available Help at Discharge: Personal care attendant;Home health;Available 24 hours/day Type of Home: Other(Comment) (condo) Home Access: Elevator;Level entry     Home Layout: One level Home Equipment: Walker - 2 wheels;Wheelchair - power      Prior  Function Level of Independence: Needs assistance   Gait / Transfers Assistance Needed: Pt states until recently he would transfer bed to power chair and to power chair to recliner or commode.  Pt admits to one fall  ADL's / Homemaking Assistance Needed: per pt, he gets assist for ADLs from personal care aide        Hand Dominance   Dominant Hand: Right    Extremity/Trunk Assessment   Upper Extremity Assessment Upper Extremity Assessment: Defer to OT evaluation    Lower Extremity Assessment Lower Extremity Assessment: Generalized weakness;RLE deficits/detail;LLE deficits/detail RLE Deficits / Details: bruises and small healing wounds on shins/knees; pt pumping ankles weakly and flexing knees to ~ 30 degrees but unwilling to proceed further LLE Deficits / Details: as for R LE       Communication   Communication: No difficulties  Cognition Arousal/Alertness: Awake/alert Behavior During Therapy: Agitated Overall Cognitive Status: No family/caregiver present to determine baseline cognitive functioning                                 General Comments: Pt initially lethargic and difficult to rouse but once awake expressing frustration regarding pain meds  General Comments      Exercises     Assessment/Plan    PT Assessment Patient needs continued PT services  PT Problem List Decreased strength;Decreased range of motion;Decreased activity tolerance;Decreased balance;Decreased mobility;Decreased knowledge of use of DME;Obesity;Pain       PT Treatment Interventions DME instruction;Functional mobility training;Therapeutic activities;Therapeutic exercise;Balance training;Patient/family education    PT Goals (Current goals can be found in the Care Plan section)  Acute Rehab PT Goals Patient Stated Goal: Regain IND PT Goal Formulation: With patient Time For Goal Achievement: 07/12/20 Potential to Achieve Goals: Fair    Frequency Min 3X/week   Barriers  to discharge Decreased caregiver support No assist at night    Co-evaluation PT/OT/SLP Co-Evaluation/Treatment: Yes Reason for Co-Treatment: For patient/therapist safety PT goals addressed during session: Mobility/safety with mobility;Strengthening/ROM OT goals addressed during session: ADL's and self-care       AM-PAC PT "6 Clicks" Mobility  Outcome Measure Help needed turning from your back to your side while in a flat bed without using bedrails?: A Lot Help needed moving from lying on your back to sitting on the side of a flat bed without using bedrails?: A Lot Help needed moving to and from a bed to a chair (including a wheelchair)?: A Lot Help needed standing up from a chair using your arms (e.g., wheelchair or bedside chair)?: A Lot Help needed to walk in hospital room?: Total Help needed climbing 3-5 steps with a railing? : Total 6 Click Score: 10    End of Session   Activity Tolerance: Patient limited by fatigue;Patient limited by pain Patient left: in bed;with call bell/phone within reach Nurse Communication: Mobility status PT Visit Diagnosis: Difficulty in walking, not elsewhere classified (R26.2);Muscle weakness (generalized) (M62.81);Pain Pain - part of body: Shoulder (back)    Time: 6237-6283 PT Time Calculation (min) (ACUTE ONLY): 19 min   Charges:   PT Evaluation $PT Eval Low Complexity: 1 Low          [place] Pager [phone] Office [phone]   Jeriel Vivanco 06/28/2020, 1:55 PM

## 2020-06-28 NOTE — Evaluation (Signed)
Occupational Therapy Evaluation Patient Details Name: Jorge Sherman MRN: [id number] DOB: 1941-09-28 Today's Date: 06/28/2020    History of Present Illness Pt admitted with N/V/diarrhea and weaknessand now with acute renal failure, Hyponatremia, and Hypokalemia.  Pt with hx of DM, spinal stenosis and back surgery x 3   Clinical Impression   Pt admitted as above and presenting with decreased I with ADL activity and  functional mobility limitations 2* generalized weakness, obesity, chronic pain and premorbid deconditioning.  Pt would benefit from follow up rehab at SNF level to maximize IND and safety - pt states "I have been before and it might be the best thing for me"    Follow Up Recommendations  SNF    Equipment Recommendations  None recommended by OT    Recommendations for Other Services       Precautions / Restrictions Precautions Precautions: Fall Restrictions Weight Bearing Restrictions: No      Mobility Bed Mobility               General bed mobility comments: Pt refusing to attempt to move from supine  Transfers                 General transfer comment: Pt refused    Balance                                           ADL either performed or assessed with clinical judgement   ADL Overall ADL's : Needs assistance/impaired Eating/Feeding: Minimal assistance;Bed level;Cueing for sequencing Eating/Feeding Details (indicate cue type and reason): HOB raised Grooming: Wash/dry hands;Bed level;Wash/dry face;Minimal assistance Grooming Details (indicate cue type and reason): HOB raised- MAX encouragement                                     Vision Patient Visual Report: No change from baseline              Pertinent Vitals/Pain Pain Assessment: Faces Faces Pain Scale: Hurts even more Pain Location: bil shoulders/ back Pain Descriptors / Indicators: Guarding;Grimacing Pain Intervention(s): Limited activity within  patient's tolerance;Monitored during session;Repositioned     Hand Dominance Right   Extremity/Trunk Assessment Upper Extremity Assessment Upper Extremity Assessment: Generalized weakness (pt states he had surgery on both shoulder. When ask to move them pt had difficulty but then OT observed pt use BUE for ADL activity as needed)   Lower Extremity Assessment Lower Extremity Assessment: Generalized weakness;RLE deficits/detail;LLE deficits/detail RLE Deficits / Details: bruises and small healing wounds on shins/knees; pt pumping ankles weakly and flexing knees to ~ 30 degrees but unwilling to proceed further LLE Deficits / Details: as for R LE       Communication Communication Communication: No difficulties   Cognition Arousal/Alertness: Awake/alert Behavior During Therapy: Agitated Overall Cognitive Status: No family/caregiver present to determine baseline cognitive functioning                                 General Comments: Pt initially lethargic and difficult to rouse but once awake expressing frustration regarding pain meds   General Comments               Home Living Family/patient expects to be discharged to:: Skilled nursing facility Living Arrangements:  Alone Available Help at Discharge: Personal care attendant;Home health;Available 24 hours/day Type of Home: Other(Comment) (condo) Home Access: Elevator;Level entry     Home Layout: One level               Home Equipment: Walker - 2 wheels;Wheelchair - power          Prior Functioning/Environment Level of Independence: Needs assistance  Gait / Transfers Assistance Needed: Pt states until recently he would transfer bed to power chair and to power chair to recliner or commode.  Pt admits to one fall ADL's / Homemaking Assistance Needed: per pt, he gets assist for ADLs from personal care aide            OT Problem List: Decreased strength;Impaired balance (sitting and/or  standing);Decreased knowledge of use of DME or AE;Decreased activity tolerance;Pain;Obesity;Decreased safety awareness      OT Treatment/Interventions: Self-care/ADL training;Patient/family education;DME and/or AE instruction    OT Goals(Current goals can be found in the care plan section) Acute Rehab OT Goals Patient Stated Goal: Regain IND OT Goal Formulation: With patient Time For Goal Achievement: 07/12/20 Potential to Achieve Goals: Good ADL Goals Pt Will Perform Eating: Independently;sitting Pt Will Perform Grooming: with modified independence;sitting Pt Will Transfer to Toilet: with supervision;bedside commode;stand pivot transfer Pt Will Perform Toileting - Clothing Manipulation and hygiene: with min assist;sitting/lateral leans;sit to/from stand  OT Frequency: Min 2X/week   Barriers to D/C: Decreased caregiver support          Co-evaluation   Reason for Co-Treatment: Complexity of the patient's impairments (multi-system involvement);For patient/therapist safety;To address functional/ADL transfers PT goals addressed during session: Mobility/safety with mobility;Strengthening/ROM OT goals addressed during session: ADL's and self-care      AM-PAC OT "6 Clicks" Daily Activity     Outcome Measure Help from another person eating meals?: A Little Help from another person taking care of personal grooming?: A Little Help from another person toileting, which includes using toliet, bedpan, or urinal?: Total Help from another person bathing (including washing, rinsing, drying)?: A Lot Help from another person to put on and taking off regular upper body clothing?: A Lot Help from another person to put on and taking off regular lower body clothing?: Total 6 Click Score: 12   End of Session Nurse Communication: Mobility status  Activity Tolerance: Treatment limited secondary to agitation Patient left: in bed;with call bell/phone within reach;with bed alarm set  OT Visit  Diagnosis: Unsteadiness on feet (R26.81);Other abnormalities of gait and mobility (R26.89);Muscle weakness (generalized) (M62.81);History of falling (Z91.81);Pain Pain - part of body: Shoulder                Time: 8889-1694 OT Time Calculation (min): 21 min Charges:  OT General Charges $OT Visit: 1 Visit OT Evaluation $OT Eval Moderate Complexity: 1 Mod  Kari Baars, OT Acute Rehabilitation Services [phone] Office- [phone]     Delta Pichon, Edwena Felty D 06/28/2020, 3:21 PM

## 2020-06-29 ENCOUNTER — Inpatient Hospital Stay (HOSPITAL_COMMUNITY): Payer: Medicare Other

## 2020-06-29 DIAGNOSIS — N179 Acute kidney failure, unspecified: Secondary | ICD-10-CM | POA: Diagnosis not present

## 2020-06-29 DIAGNOSIS — I10 Essential (primary) hypertension: Secondary | ICD-10-CM | POA: Diagnosis not present

## 2020-06-29 DIAGNOSIS — G894 Chronic pain syndrome: Secondary | ICD-10-CM | POA: Diagnosis not present

## 2020-06-29 DIAGNOSIS — R197 Diarrhea, unspecified: Secondary | ICD-10-CM | POA: Diagnosis not present

## 2020-06-29 LAB — C DIFFICILE QUICK SCREEN W PCR REFLEX
C Diff antigen: NEGATIVE
C Diff interpretation: NOT DETECTED
C Diff toxin: NEGATIVE

## 2020-06-29 MED ORDER — CELECOXIB 200 MG PO CAPS: 200.0000 mg | ORAL_CAPSULE | Freq: Two times a day (BID) | ORAL | Status: DC

## 2020-06-29 MED ORDER — DICLOFENAC SODIUM 1 % EX GEL
4.0000 g | Freq: Four times a day (QID) | CUTANEOUS | Status: DC
  Administered 2020-06-29 – 2020-07-06 (×28): 4 g via TOPICAL
  Filled 2020-06-29 (×2): qty 100

## 2020-06-29 MED ORDER — DULOXETINE HCL 30 MG PO CPEP
30.0000 mg | ORAL_CAPSULE | Freq: Three times a day (TID) | ORAL | Status: DC
  Administered 2020-06-29 – 2020-06-30 (×4): 30 mg via ORAL
  Filled 2020-06-29 (×4): qty 1

## 2020-06-29 MED ORDER — OXYCODONE HCL 5 MG PO TABS: 10.0000 mg | ORAL_TABLET | Freq: Once | ORAL | Status: DC

## 2020-06-29 MED ORDER — KETOROLAC TROMETHAMINE 10 MG PO TABS
10.0000 mg | ORAL_TABLET | Freq: Every day | ORAL | Status: DC
  Filled 2020-06-29: qty 1

## 2020-06-29 MED ORDER — KETOROLAC TROMETHAMINE 15 MG/ML IJ SOLN
15.0000 mg | Freq: Once | INTRAMUSCULAR | Status: AC
  Administered 2020-06-29: 15 mg via INTRAVENOUS
  Filled 2020-06-29: qty 1

## 2020-06-29 MED ORDER — KETOROLAC TROMETHAMINE 10 MG PO TABS: 10.0000 mg | ORAL_TABLET | Freq: Four times a day (QID) | ORAL | Status: DC | PRN

## 2020-06-29 MED ORDER — OXYCODONE HCL 5 MG PO TABS
15.0000 mg | ORAL_TABLET | Freq: Once | ORAL | Status: AC
  Administered 2020-06-29: 15 mg via ORAL
  Filled 2020-06-29: qty 3

## 2020-06-29 NOTE — Progress Notes (Addendum)
PROGRESS NOTE    ADONAI SELSOR   RJJ:[id number]  DOB: 24-Oct-1941  DOA: 06/27/2020 PCP: Merrilee Seashore, MD   Brief Narrative:  Jorge Sherman is a 79 year old male with essential hypertension, chronic back pain related to spinal stenosis, rheumatoid arthritis, OSA noncompliant with CPAP on numerous medications who lives alone and has private caretakers and presents to the hospital EMS for nausea vomiting and diarrhea for a number of days. In the ED> stated that he felt dehydrated and complained of ongoing chronic back pain skin for his Toradol.  He had episode of diarrhea in the ED. Tachycardic with a temperature of 100.6 rectally. Creatinine was noted to be elevated from his baseline and was 1.87 with a BUN of 40, sodium 133, potassium 3.4 and chloride 97. White blood cell count was 27.9 and platelets were 513. The patient was started on IV fluids for dehydration.  Stool studies were ordered.     Subjective: He states he has had 3 BMs in the bed since yesterday. No vomiting. He is tolerating ice chips and asking for diet coke. He is unsatisfied with the meds we are giving for his back pain and would like them increased.  Assessment & Plan:   Principal Problem:   AKI (acute kidney injury)  -Creatinine has improved with IV fluids-continue IV fluids until he is able to tolerate a diet -As his creatinine has improved, I will allow him to have 1 dose of oral Toradol 10 mg which he takes at home -Hold off on giving him further Toradol and follow-up on creatinine tomorrow morning  Active Problems:   Acute vomiting and diarrhea with fever and leukocytosis - colonic ileus - Abdomen is distended and tympanic -bowel sounds are high-pitched -abdominal x-ray reveals a colonic ileus which is likely secondary to underlying infection. -N.p.o. except for of water, meds and ice chips for now -Limit narcotics - continues to be distended today with ongoing small episodes of diarrhea- check CT  abd/pelvis- still awaiting stool studies    Chronic pain -I have gone over his list of home medications with him-he states he takes gabapentin 800 mg 3 times daily, Robaxin, Cymbalta, Celebrex, Toradol and oxycodone-there are 2 doses of oxycodone written on his med rec which are 5 mg in a 20 mg but the patient states he takes 10 mg of oxycodone - he is unable to walk - start Voltaren gel and a k pad to reduce stiffness in back due to hospital bed    Essential hypertension, benign -Continue amlodipine which is his home medication -As needed IV hydralazine has also been added  BPH -Continue Flomax daily  Functional paraplegia -The patient is wheelchair-bound and is able to transfer on his own but is not able to walk - he has a motorized "cart" at home and private aids during the day - he is interested in going to rehab after the hospital stay   Time spent in minutes: 35 DVT prophylaxis: Heparin Code Status: Full code Family Communication:  Disposition Plan:  Status is: Inpatient  Remains inpatient appropriate because:ileus, vomiting, diarhea and dehydration   Dispo: The patient is from: Home              Anticipated d/c is to: TBD              Anticipated d/c date is: > 3 days              Patient currently is not medically stable to d/c.  Consultants:  none Procedures:   none Antimicrobials:  Anti-infectives (From admission, onward)   None       Objective: Vitals:   06/28/20 0521 06/28/20 1450 06/28/20 2206 06/29/20 0503  BP: (!) 152/75 (!) 160/84 (!) 153/74 (!) 155/67  Pulse: 92 (!) 101 94 86  Resp: 17 16 16 16   Temp: 98.3 F (36.8 C) 99.1 F (37.3 C) 99.1 F (37.3 C) 98.8 F (37.1 C)  TempSrc: Oral Oral Oral Oral  SpO2: 96% 100% 99% 96%  Weight:      Height:        Intake/Output Summary (Last 24 hours) at 06/29/2020 1220 Last data filed at 06/29/2020 0900 Gross per 24 hour  Intake 1226.67 ml  Output 1075 ml  Net 151.67 ml   Filed Weights    06/26/20 1750  Weight: 113.4 kg    Examination: General exam: Appears comfortable  HEENT: PERRLA, oral mucosa moist, no sclera icterus or thrush Respiratory system: Clear to auscultation. Respiratory effort normal. Cardiovascular system: S1 & S2 heard,  No murmurs  Gastrointestinal system: Abdomen soft,  Distended but less than yesterday - tender in lower abdomen. Normal bowel sounds   Central nervous system: Alert and oriented. No focal neurological deficits. Extremities: No cyanosis, clubbing or edema Skin: No rashes or ulcers Psychiatry:  Mood & affect appropriate.     Data Reviewed: I have personally reviewed following labs and imaging studies  CBC: Recent Labs  Lab 06/27/20 0816 06/28/20 0757  WBC 27.9* 20.2*  NEUTROABS 23.9*  --   HGB 14.3 11.4*  HCT 41.9 34.8*  MCV 86.7 90.9  PLT 513* 540   Basic Metabolic Panel: Recent Labs  Lab 06/27/20 0816 06/28/20 0757 06/28/20 1357  NA 133* 127* 135  K 3.4* 3.6 4.1  CL 97* 97* 104  CO2 18* 21* 24  GLUCOSE 189* 140* 144*  BUN 40* 28* 30*  CREATININE 1.87* 1.14 1.03  CALCIUM 10.1 8.3* 8.6*  MG  --  2.5*  --    GFR: Estimated Creatinine Clearance: 73.4 mL/min (by C-G formula based on SCr of 1.03 mg/dL). Liver Function Tests: Recent Labs  Lab 06/27/20 0816 06/28/20 0757  AST 24 21  ALT 23 17  ALKPHOS 70 52  BILITOT 0.6 0.4  PROT 8.5* 6.6  ALBUMIN 5.2* 3.7   Recent Labs  Lab 06/27/20 0816  LIPASE 41   No results for input(s): AMMONIA in the last 168 hours. Coagulation Profile: No results for input(s): INR, PROTIME in the last 168 hours. Cardiac Enzymes: No results for input(s): CKTOTAL, CKMB, CKMBINDEX, TROPONINI in the last 168 hours. BNP (last 3 results) No results for input(s): PROBNP in the last 8760 hours. HbA1C: No results for input(s): HGBA1C in the last 72 hours. CBG: No results for input(s): GLUCAP in the last 168 hours. Lipid Profile: No results for input(s): CHOL, HDL, LDLCALC, TRIG,  CHOLHDL, LDLDIRECT in the last 72 hours. Thyroid Function Tests: No results for input(s): TSH, T4TOTAL, FREET4, T3FREE, THYROIDAB in the last 72 hours. Anemia Panel: No results for input(s): VITAMINB12, FOLATE, FERRITIN, TIBC, IRON, RETICCTPCT in the last 72 hours. Urine analysis:    Component Value Date/Time   COLORURINE YELLOW 06/26/2020 [place] 06/26/2020 1757   LABSPEC 1.020 06/26/2020 1757   PHURINE 5.0 06/26/2020 1757   GLUCOSEU NEGATIVE 06/26/2020 1757   HGBUR NEGATIVE 06/26/2020 1757   BILIRUBINUR NEGATIVE 06/26/2020 1757   KETONESUR 5 (A) 06/26/2020 1757   PROTEINUR 100 (A) 06/26/2020 1757   NITRITE  NEGATIVE 06/26/2020 1757   LEUKOCYTESUR NEGATIVE 06/26/2020 1757   Sepsis Labs: @LABRCNTIP (procalcitonin:4,lacticidven:4) ) Recent Results (from the past 240 hour(s))  Culture, blood (routine x 2)     Status: None (Preliminary result)   Collection Time: 06/27/20 10:27 AM   Specimen: BLOOD  Result Value Ref Range Status   Specimen Description   Final    BLOOD RIGHT ANTECUBITAL Performed at [facility], [place] [address], [place], [place] [address]    Special Requests   Final    BOTTLES DRAWN AEROBIC AND ANAEROBIC Blood Culture adequate volume Performed at [place] [address], [place], [place] [address]    Culture   Final    NO GROWTH 2 DAYS Performed at [facility] [address] [place], [place] [address]    Report Status PENDING  Incomplete  SARS Coronavirus 2 by RT PCR (hospital order, performed in [facility] hospital lab) Nasopharyngeal Nasopharyngeal Swab     Status: None   Collection Time: 06/27/20  1:39 PM   Specimen: Nasopharyngeal Swab  Result Value Ref Range Status   SARS Coronavirus 2 NEGATIVE NEGATIVE Final    Comment: (NOTE) SARS-CoV-2 target nucleic acids are NOT DETECTED.  The SARS-CoV-2 RNA is generally detectable in upper and lower respiratory specimens during the acute phase of  infection. The lowest concentration of SARS-CoV-2 viral copies this assay can detect is 250 copies / mL. A negative result does not preclude SARS-CoV-2 infection and should not be used as the sole basis for treatment or other patient management decisions.  A negative result may occur with improper specimen collection / handling, submission of specimen other than nasopharyngeal swab, presence of viral mutation(s) within the areas targeted by this assay, and inadequate number of viral copies (<250 copies / mL). A negative result must be combined with clinical observations, patient history, and epidemiological information.  Fact Sheet for Patients:   StrictlyIdeas.no  Fact Sheet for Healthcare Providers: BankingDealers.co.za  This test is not yet approved or  cleared by the [place] FDA and has been authorized for detection and/or diagnosis of SARS-CoV-2 by FDA under an Emergency Use Authorization (EUA).  This EUA will remain in effect (meaning this test can be used) for the duration of the COVID-19 declaration under Section 564(b)(1) of the Act, 21 U.S.C. section 360bbb-3(b)(1), unless the authorization is terminated or revoked sooner.  Performed at [facility], [place] [address] [place], [place] [address]          Radiology Studies: DG Abd Portable 2V  Result Date: 06/28/2020 CLINICAL DATA:  Generalized abdominal pain and distention EXAM: PORTABLE ABDOMEN - 2 VIEW COMPARISON:  08/05/2019 CT abdomen/pelvis FINDINGS: Diffuse prominent gaseous distention of the colon. No disproportionately dilated small bowel loops. No evidence of pneumatosis or pneumoperitoneum. Bilateral posterior spinal fusion hardware in the lower lumbar spine with post vertebroplasty change at L1. IMPRESSION: Diffuse prominent gaseous distention of the colon, favor colonic ileus. Electronically Signed   By: Ilona Sorrel M.D.   On: 06/28/2020  12:15      Scheduled Meds: . amLODipine  5 mg Oral Daily  . diclofenac Sodium  4 g Topical QID  . DULoxetine  30 mg Oral TID  . gabapentin  800 mg Oral TID  . heparin  5,000 Units Subcutaneous Q8H  . ketorolac  15 mg Intravenous Once  . pantoprazole  40 mg Oral Daily  . rosuvastatin  40 mg Oral QPM  . sodium chloride flush  3 mL Intravenous Once  . tamsulosin  0.4 mg Oral Daily   Continuous Infusions: . 0.9 % NaCl with KCl 20 mEq / L 100 mL/hr at 06/29/20 0333     LOS: 2 days      Debbe Odea, MD [facility] Pager: [email or web address] 06/29/2020, 12:20 PM

## 2020-06-29 NOTE — Progress Notes (Signed)
Patient yelling and cursing this morning with complaints of pain. Pain medication was previously administered. Urine output 825 mL this shift.

## 2020-06-29 NOTE — TOC Initial Note (Signed)
Transition of Care [facility]) - Initial/Assessment Note    Patient Details  Name: FINDLEY VI MRN: [id number] Date of Birth: Sep 16, 1941  Transition [facility]) CM/SW Contact:    Lennart Pall, LCSW Phone Number: 06/29/2020, 1:27 PM  Clinical Narrative:                 Met with pt and spoke with daughter via phone to review anticipated d/c needs.  Pt confirms that he lives in a wheelchair/ scooter accessible condo and has private duty caregivers during the day from [place].  He has had several short term SNF/ rehab stays after prior hospitalizations and is open to considering this again.  Notes the goal of rehab would be to regain some independence with his transfers.  Daughter is agreeable to this plan as well. Daughter does have some questions about pt's current medical status - have alerted MD.  Work up still underway but anticipate ready for SNF dc soon. Will review pt prefs for facilities and begin bed search.  Expected Discharge Plan: Skilled Nursing Facility Barriers to Discharge: Continued Medical Work up   Patient Goals and CMS Choice Patient states their goals for this hospitalization and ongoing recovery are:: to eventually get back home CMS Medicare.gov Compare Post Acute Care list provided to:: Patient Choice offered to / list presented to : Patient  Expected Discharge Plan and Services Expected Discharge Plan: [place] In-house Referral: Clinical Social Work     Living arrangements for the past 2 months: [place]                                      Prior Living Arrangements/Services Living arrangements for the past 2 months: Single Family Home Lives with:: Self Patient language and need for interpreter reviewed:: No Do you feel safe going back to the place where you live?: Yes      Need for Family Participation in Patient Care: No (Comment) Care giver support system in place?: Yes (comment) Current home services:  Homehealth aide Criminal Activity/Legal Involvement Pertinent to Current Situation/Hospitalization: No - Comment as needed  Activities of Daily Living Home Assistive Devices/Equipment: Wheelchair, Shower chair with back, Hand-held shower hose, Grab bars in shower, Grab bars around toilet, Eyeglasses, CBG Meter ADL Screening (condition at time of admission) Patient's cognitive ability adequate to safely complete daily activities?: No Is the patient deaf or have difficulty hearing?: No Does the patient have difficulty seeing, even when wearing glasses/contacts?: No Does the patient have difficulty concentrating, remembering, or making decisions?: Yes Patient able to express need for assistance with ADLs?: Yes Does the patient have difficulty dressing or bathing?: Yes Independently performs ADLs?: No Does the patient have difficulty walking or climbing stairs?: Yes Weakness of Legs: Both Weakness of Arms/Hands: Both  Permission Sought/Granted Permission sought to share information with : Family Supports Permission granted to share information with : Yes, Verbal Permission Granted  Share Information with NAME: Reshaun Briseno     Permission granted to share info w Relationship: daughter  Permission granted to share info w Contact Information: [phone]  Emotional Assessment Appearance:: Appears stated age Attitude/Demeanor/Rapport: Engaged Affect (typically observed): Accepting Orientation: : Oriented to Place, Oriented to  Time, Oriented to Situation, Oriented to Self Alcohol / Substance Use: Not Applicable Psych Involvement: No (comment)  Admission diagnosis:  AKI (acute kidney injury) ([place]) [N17.9] Leukocytosis, unspecified type [P10.258]  Diarrhea, unspecified type [R19.7] Patient Active Problem List   Diagnosis Date Noted  . AKI (acute kidney injury) ([place]) 06/27/2020  . Acute diarrhea 06/27/2020  . Altered mental status 08/28/2019  . Septic joint of left knee joint ([place])  04/22/2019  . Erectile dysfunction 10/30/2013  . Essential hypertension, benign 10/30/2013  . Coronary atherosclerosis of native coronary artery 10/30/2013  . Dizziness and giddiness   . Pain in joint, lower leg   . Leukocytosis, unspecified   . Type II or unspecified type diabetes mellitus without mention of complication, not stated as uncontrolled   . Obstructive sleep apnea   . Depressive disorder, not elsewhere classified   . Essential and other specified forms of tremor   . Headache(784.0)   . Spinal stenosis, lumbar region, without neurogenic claudication   . Thoracic or lumbosacral neuritis or radiculitis, unspecified   . Chronic pain    PCP:  Merrilee Seashore, MD Pharmacy:   [id number] [place] [address]Phone: [phone]: [phone] [id number] [place], [address]one: [phone]: [phone]   Social Determinants of Health (SDOH) Interventions    Readmission Risk Interventions Readmission Risk Prevention Plan 06/29/2020  Transportation Screening Complete  Home Care Screening Complete  Medication Review (RN CM) Complete  Some recent data might be hidden

## 2020-06-30 DIAGNOSIS — N179 Acute kidney failure, unspecified: Secondary | ICD-10-CM | POA: Diagnosis not present

## 2020-06-30 LAB — CBC
HCT: 29.2 % — ABNORMAL LOW (ref 39.0–52.0)
Hemoglobin: 9.8 g/dL — ABNORMAL LOW (ref 13.0–17.0)
MCH: 30.2 pg (ref 26.0–34.0)
MCHC: 33.6 g/dL (ref 30.0–36.0)
MCV: 90.1 fL (ref 80.0–100.0)
Platelets: 289 10*3/uL (ref 150–400)
RBC: 3.24 MIL/uL — ABNORMAL LOW (ref 4.22–5.81)
RDW: 14.3 % (ref 11.5–15.5)
WBC: 12.8 10*3/uL — ABNORMAL HIGH (ref 4.0–10.5)
nRBC: 0 % (ref 0.0–0.2)

## 2020-06-30 LAB — GASTROINTESTINAL PANEL BY PCR, STOOL (REPLACES STOOL CULTURE)

## 2020-06-30 LAB — BASIC METABOLIC PANEL
Anion gap: 7 (ref 5–15)
BUN: 12 mg/dL (ref 8–23)
CO2: 23 mmol/L (ref 22–32)
Calcium: 8.4 mg/dL — ABNORMAL LOW (ref 8.9–10.3)
Chloride: 102 mmol/L (ref 98–111)
Creatinine, Ser: 0.61 mg/dL (ref 0.61–1.24)
GFR calc Af Amer: >60 mL/min (ref >60)
GFR calc non Af Amer: >60 mL/min (ref >60)
Glucose, Bld: 135 mg/dL — ABNORMAL HIGH (ref 70–99)
Potassium: 4.1 mmol/L (ref 3.5–5.1)
Sodium: 132 mmol/L — ABNORMAL LOW (ref 135–145)

## 2020-06-30 MED ORDER — KETOROLAC TROMETHAMINE 10 MG PO TABS
10.0000 mg | ORAL_TABLET | Freq: Every day | ORAL | Status: DC
  Filled 2020-06-30: qty 1

## 2020-06-30 MED ORDER — KETOROLAC TROMETHAMINE 15 MG/ML IJ SOLN
15.0000 mg | Freq: Once | INTRAMUSCULAR | Status: AC
  Administered 2020-06-30: 15 mg via INTRAVENOUS
  Filled 2020-06-30: qty 1

## 2020-06-30 MED ORDER — DULOXETINE HCL 20 MG PO CPEP
40.0000 mg | ORAL_CAPSULE | Freq: Three times a day (TID) | ORAL | Status: DC
  Administered 2020-06-30 – 2020-07-06 (×19): 40 mg via ORAL
  Filled 2020-06-30 (×19): qty 2

## 2020-06-30 MED ORDER — CLONIDINE HCL 0.1 MG PO TABS
0.1000 mg | ORAL_TABLET | Freq: Two times a day (BID) | ORAL | Status: DC
  Administered 2020-06-30 – 2020-07-06 (×12): 0.1 mg via ORAL
  Filled 2020-06-30 (×12): qty 1

## 2020-06-30 MED ORDER — HYDROXYCHLOROQUINE SULFATE 200 MG PO TABS
200.0000 mg | ORAL_TABLET | Freq: Two times a day (BID) | ORAL | Status: DC
  Administered 2020-06-30 – 2020-07-06 (×12): 200 mg via ORAL
  Filled 2020-06-30 (×12): qty 1

## 2020-06-30 MED ORDER — KETOROLAC TROMETHAMINE 10 MG PO TABS
10.0000 mg | ORAL_TABLET | Freq: Three times a day (TID) | ORAL | Status: DC
  Administered 2020-06-30 – 2020-07-03 (×8): 10 mg via ORAL
  Filled 2020-06-30 (×9): qty 1

## 2020-06-30 MED ORDER — METHOCARBAMOL 500 MG PO TABS
750.0000 mg | ORAL_TABLET | Freq: Four times a day (QID) | ORAL | Status: DC
  Administered 2020-06-30 – 2020-07-06 (×25): 750 mg via ORAL
  Filled 2020-06-30 (×25): qty 2

## 2020-06-30 MED ORDER — OXYCODONE HCL 5 MG PO TABS
10.0000 mg | ORAL_TABLET | Freq: Four times a day (QID) | ORAL | Status: DC
  Administered 2020-06-30 – 2020-07-06 (×25): 10 mg via ORAL
  Filled 2020-06-30 (×25): qty 2

## 2020-06-30 NOTE — NC FL2 (Addendum)
[place] LEVEL OF CARE SCREENING TOOL     IDENTIFICATION  Patient Name: Jorge Sherman Birthdate: 1941/03/18 Sex: male Admission Date (Current Location): 06/27/2020  [facility] and [place] Number:  Herbalist and Address:  [facility],  [place] [address], [place]      Provider Number: [id number]  Attending Physician Name and Address:  Debbe Odea, MD  Relative Name and Phone Number:  daughter, Amaris Garrette @ [phone]    Current Level of Care: Hospital Recommended Level of Care: [place] Prior Approval Number:    Date Approved/Denied:   PASRR Number: [id number]  Discharge Plan: SNF    Current Diagnoses: Patient Active Problem List   Diagnosis Date Noted  . AKI (acute kidney injury) ([place]) 06/27/2020  . Acute diarrhea 06/27/2020  . Altered mental status 08/28/2019  . Septic joint of left knee joint ([place]) 04/22/2019  . Erectile dysfunction 10/30/2013  . Essential hypertension, benign 10/30/2013  . Coronary atherosclerosis of native coronary artery 10/30/2013  . Dizziness and giddiness   . Pain in joint, lower leg   . Leukocytosis, unspecified   . Type II or unspecified type diabetes mellitus without mention of complication, not stated as uncontrolled   . Obstructive sleep apnea   . Depressive disorder, not elsewhere classified   . Essential and other specified forms of tremor   . Headache(784.0)   . Spinal stenosis, lumbar region, without neurogenic claudication   . Thoracic or lumbosacral neuritis or radiculitis, unspecified   . Chronic pain     Orientation RESPIRATION BLADDER Height & Weight     Self, Time, Situation, Place  Normal Incontinent, External catheter Weight: 250 lb (113.4 kg) Height:  5\' 10"  (177.8 cm)  BEHAVIORAL SYMPTOMS/MOOD NEUROLOGICAL BOWEL NUTRITION STATUS      Incontinent    AMBULATORY STATUS COMMUNICATION OF NEEDS Skin   Total Care Verbally Normal                        Personal Care Assistance Level of Assistance  Bathing, Dressing Bathing Assistance: Limited assistance   Dressing Assistance: Limited assistance     Functional Limitations Info             SPECIAL CARE FACTORS FREQUENCY  PT (By licensed PT), OT (By licensed OT)     PT Frequency: 5x/wk OT Frequency: 5x/wk            Contractures Contractures Info: Not present    Additional Factors Info  Code Status, Allergies, Psychotropic, Isolation Precautions Code Status Info: Full Allergies Info: see MAR Psychotropic Info: see MAR   Isolation Precautions Info: VRE (urine); contact precautions     Current Medications (06/30/2020):  This is the current hospital active medication list Current Facility-Administered Medications  Medication Dose Route Frequency Provider Last Rate Last Admin  . 0.9 % NaCl with KCl 20 mEq/ L  infusion   Intravenous Continuous Debbe Odea, MD 100 mL/hr at 06/30/20 1242 New Bag at 06/30/20 1242  . acetaminophen (TYLENOL) tablet 650 mg  650 mg Oral Q6H PRN Bonnielee Haff, MD   650 mg at 06/30/20 0236   Or  . acetaminophen (TYLENOL) suppository 650 mg  650 mg Rectal Q6H PRN Bonnielee Haff, MD      . alum & mag hydroxide-simeth (MAALOX/MYLANTA) 200-200-20 MG/5ML suspension 30 mL  30 mL Oral Q4H PRN Bonnielee Haff, MD   30 mL at 06/29/20 0455  . amLODipine (NORVASC) tablet 5  mg  5 mg Oral Daily Bonnielee Haff, MD   5 mg at 06/30/20 0939  . diclofenac Sodium (VOLTAREN) 1 % topical gel 4 g  4 g Topical QID Debbe Odea, MD   4 g at 06/30/20 0942  . DULoxetine (CYMBALTA) DR capsule 30 mg  30 mg Oral TID Debbe Odea, MD   30 mg at 06/30/20 0939  . gabapentin (NEURONTIN) capsule 800 mg  800 mg Oral TID Debbe Odea, MD   800 mg at 06/30/20 0939  . heparin injection 5,000 Units  5,000 Units Subcutaneous Q8H Bonnielee Haff, MD   5,000 Units at 06/30/20 0553  . hydrALAZINE (APRESOLINE) injection 10 mg  10 mg Intravenous Q6H PRN Bonnielee Haff, MD   10 mg  at 06/30/20 0556  . HYDROmorphone (DILAUDID) injection 0.5 mg  0.5 mg Intravenous Q3H PRN Bonnielee Haff, MD   0.5 mg at 06/30/20 1243  . ipratropium-albuterol (DUONEB) 0.5-2.5 (3) MG/3ML nebulizer solution 3 mL  3 mL Nebulization Q4H PRN Bonnielee Haff, MD      . LORazepam (ATIVAN) tablet 0.5 mg  0.5 mg Oral TID PRN Bonnielee Haff, MD   0.5 mg at 06/29/20 2253  . methocarbamol (ROBAXIN) tablet 750 mg  750 mg Oral QID Debbe Odea, MD   750 mg at 06/30/20 0935  . ondansetron (ZOFRAN) tablet 4 mg  4 mg Oral Q6H PRN Bonnielee Haff, MD       Or  . ondansetron [facility]) injection 4 mg  4 mg Intravenous Q6H PRN Bonnielee Haff, MD      . oxyCODONE (Oxy IR/ROXICODONE) immediate release tablet 10 mg  10 mg Oral QID Debbe Odea, MD   10 mg at 06/30/20 0935  . pantoprazole (PROTONIX) EC tablet 40 mg  40 mg Oral Daily Debbe Odea, MD   40 mg at 06/30/20 0941  . rosuvastatin (CRESTOR) tablet 40 mg  40 mg Oral QPM Bonnielee Haff, MD   40 mg at 06/29/20 1746  . sodium chloride flush (NS) 0.9 % injection 3 mL  3 mL Intravenous Once Bonnielee Haff, MD      . tamsulosin [facility]) capsule 0.4 mg  0.4 mg Oral Daily Bonnielee Haff, MD   0.4 mg at 06/30/20 1062     Discharge Medications: Please see discharge summary for a list of discharge medications.  Relevant Imaging Results:  Relevant Lab Results:   Additional Information SS#  [id number]  Lennart Pall, LCSW

## 2020-06-30 NOTE — Care Management Important Message (Signed)
Important Message  Patient Details IM Letter given to the Patient Name: Jorge Sherman MRN: [id number] Date of Birth: [birth year]   Medicare Important Message Given:  Yes     Kerin Salen 06/30/2020, 9:39 AM

## 2020-06-30 NOTE — Progress Notes (Addendum)
PROGRESS NOTE    Jorge Sherman   JOI:[id number]  DOB: [birth year]  DOA: 06/27/2020 PCP: Merrilee Seashore, MD   Brief Narrative:  Jorge Sherman is a 79 year old male with essential hypertension, chronic back pain related to spinal stenosis, rheumatoid arthritis, OSA noncompliant with CPAP on numerous medications who lives alone and has private caretakers and presents to the hospital EMS for nausea vomiting and diarrhea for a number of days. In the ED> stated that he felt dehydrated and complained of ongoing chronic back pain skin for his Toradol.  He had episode of diarrhea in the ED. Tachycardic with a temperature of 100.6 rectally. Creatinine was noted to be elevated from his baseline and was 1.87 with a BUN of 40, sodium 133, potassium 3.4 and chloride 97. White blood cell count was 27.9 and platelets were 513. The patient was started on IV fluids for dehydration.  Stool studies were ordered.     Subjective: Abdomen is getting better. Less swollen and no diarrhea. Back pain is severe today and in right lower abdomen.  Assessment & Plan:   Principal Problem:   AKI (acute kidney injury)  -Creatinine has improved with IV fluids-continue IV fluids until he is able to tolerate a diet   Active Problems:   Acute vomiting and diarrhea with fever and leukocytosis - colonic ileus - Abdomen is distended and tympanic -bowel sounds are high-pitched -abdominal x-ray reveals a colonic ileus which is likely secondary to underlying infection. -N.p.o. except for of water, meds and ice chips for now -Limit narcotics - 8.9> continues to be distended today with ongoing small episodes of diarrhea- check CT abd/pelvis- still awaiting stool studies - CT reveals improving distention, no bowel wall thickening- hold off on Antibiotics  - 8/10> tolerating clears- start full liquids today- advance to solids this evening if doing well- cont IVF but lower the rate    Chronic pain -I have gone over  his list of home medications with him-he states he takes gabapentin 800 mg 3 times daily, Robaxin, Cymbalta, Celebrex, Toradol and oxycodone-there are 2 doses of oxycodone written on his med rec which are 5 mg in a 20 mg but the patient states he takes 10 mg of oxycodone - he is unable to walk - started Voltaren gel and a k pad to reduce stiffness in back due to hospital bed - having severe pain in left lower back today- ice pack being placed now to reduce pain-  increase Robaxin from 500 to 750 mg  - make Toradol 10 mg TID and Oxycodone 10 QID - she also states that he used to have a spinal stimulator but it was removed for unclear reasons and has since been incontinent of stool and urine since which is another source of his depression    Essential hypertension, benign -Continue amlodipine which is his home medication -As needed IV hydralazine has also been added  BPH -Continue Flomax daily  Functional paraplegia -The patient is wheelchair-bound and is able to transfer on his own but is not able to walk - he has a motorized "cart" at home and private aids during the day - he is interested in going to rehab after the hospital stay  Depression - I have spoken with his daughter who states that the patient is very depressed due to his severe pain- he has told her about his severe depression but does not tell me- she plans for a pscyh appointment via the phone - increase Cymbalta to 40 mg  TID from 30 TID  Time spent in minutes: 35 DVT prophylaxis: Heparin Code Status: Full code Family Communication: with daughter GIna Disposition Plan:  Status is: Inpatient  Remains inpatient appropriate because:ileus, vomiting, diarhea and dehydration   Dispo: The patient is from: Home              Anticipated d/c is to: SNF              Anticipated d/c date is: 2 days              Patient currently is not medically stable to d/c.  Consultants:   none Procedures:   none Antimicrobials:   Anti-infectives (From admission, onward)   None       Objective: Vitals:   06/29/20 1705 06/29/20 2159 06/30/20 0529 06/30/20 0942  BP: (!) 154/81 (!) 179/84 (!) 177/72 (!) 154/80  Pulse: 87 87 82 82  Resp: 18 16 18    Temp: 99.3 F (37.4 C) 98.1 F (36.7 C) 98.1 F (36.7 C)   TempSrc: Oral Oral Oral   SpO2: 100% 100% 99%   Weight:      Height:        Intake/Output Summary (Last 24 hours) at 06/30/2020 1156 Last data filed at 06/30/2020 0600 Gross per 24 hour  Intake 2687.64 ml  Output 2100 ml  Net 587.64 ml   Filed Weights   06/26/20 1750  Weight: 113.4 kg    Examination: General exam: Appears uncomfortable due to pain HEENT: PERRLA, oral mucosa moist, no sclera icterus or thrush Respiratory system: Clear to auscultation. Respiratory effort normal. Cardiovascular system: S1 & S2 heard,  No murmurs  Gastrointestinal system: Abdomen soft, non-tender, only mild- moderate distension- Normal bowel sounds   Central nervous system: Alert and oriented. No focal neurological deficits. Extremities: No cyanosis, clubbing or edema Skin: No rashes or ulcers Psychiatry:  Mood & affect appropriate.    Data Reviewed: I have personally reviewed following labs and imaging studies  CBC: Recent Labs  Lab 06/27/20 0816 06/28/20 0757  WBC 27.9* 20.2*  NEUTROABS 23.9*  --   HGB 14.3 11.4*  HCT 41.9 34.8*  MCV 86.7 90.9  PLT 513* 812   Basic Metabolic Panel: Recent Labs  Lab 06/27/20 0816 06/28/20 0757 06/28/20 1357  NA 133* 127* 135  K 3.4* 3.6 4.1  CL 97* 97* 104  CO2 18* 21* 24  GLUCOSE 189* 140* 144*  BUN 40* 28* 30*  CREATININE 1.87* 1.14 1.03  CALCIUM 10.1 8.3* 8.6*  MG  --  2.5*  --    GFR: Estimated Creatinine Clearance: 73.4 mL/min (by C-G formula based on SCr of 1.03 mg/dL). Liver Function Tests: Recent Labs  Lab 06/27/20 0816 06/28/20 0757  AST 24 21  ALT 23 17  ALKPHOS 70 52  BILITOT 0.6 0.4  PROT 8.5* 6.6  ALBUMIN 5.2* 3.7   Recent Labs   Lab 06/27/20 0816  LIPASE 41   No results for input(s): AMMONIA in the last 168 hours. Coagulation Profile: No results for input(s): INR, PROTIME in the last 168 hours. Cardiac Enzymes: No results for input(s): CKTOTAL, CKMB, CKMBINDEX, TROPONINI in the last 168 hours. BNP (last 3 results) No results for input(s): PROBNP in the last 8760 hours. HbA1C: No results for input(s): HGBA1C in the last 72 hours. CBG: No results for input(s): GLUCAP in the last 168 hours. Lipid Profile: No results for input(s): CHOL, HDL, LDLCALC, TRIG, CHOLHDL, LDLDIRECT in the last 72 hours. Thyroid Function Tests:  No results for input(s): TSH, T4TOTAL, FREET4, T3FREE, THYROIDAB in the last 72 hours. Anemia Panel: No results for input(s): VITAMINB12, FOLATE, FERRITIN, TIBC, IRON, RETICCTPCT in the last 72 hours. Urine analysis:    Component Value Date/Time   COLORURINE YELLOW 06/26/2020 [place] 06/26/2020 1757   LABSPEC 1.020 06/26/2020 1757   PHURINE 5.0 06/26/2020 1757   GLUCOSEU NEGATIVE 06/26/2020 1757   HGBUR NEGATIVE 06/26/2020 1757   BILIRUBINUR NEGATIVE 06/26/2020 1757   KETONESUR 5 (A) 06/26/2020 1757   PROTEINUR 100 (A) 06/26/2020 1757   NITRITE NEGATIVE 06/26/2020 1757   LEUKOCYTESUR NEGATIVE 06/26/2020 1757   Sepsis Labs: @LABRCNTIP (procalcitonin:4,lacticidven:4) ) Recent Results (from the past 240 hour(s))  Culture, blood (routine x 2)     Status: None (Preliminary result)   Collection Time: 06/27/20 10:27 AM   Specimen: BLOOD  Result Value Ref Range Status   Specimen Description   Final    BLOOD RIGHT ANTECUBITAL Performed at [facility], [place] [address] [place], [place] [address]    Special Requests   Final    BOTTLES DRAWN AEROBIC AND ANAEROBIC Blood Culture adequate volume Performed at [place] [address], [place], [place] [address]    Culture   Final    NO GROWTH 3 DAYS Performed at [facility], [place]  [address] [place], [place] [address]    Report Status PENDING  Incomplete  SARS Coronavirus 2 by RT PCR (hospital order, performed in [facility] hospital lab) Nasopharyngeal Nasopharyngeal Swab     Status: None   Collection Time: 06/27/20  1:39 PM   Specimen: Nasopharyngeal Swab  Result Value Ref Range Status   SARS Coronavirus 2 NEGATIVE NEGATIVE Final    Comment: (NOTE) SARS-CoV-2 target nucleic acids are NOT DETECTED.  The SARS-CoV-2 RNA is generally detectable in upper and lower respiratory specimens during the acute phase of infection. The lowest concentration of SARS-CoV-2 viral copies this assay can detect is 250 copies / mL. A negative result does not preclude SARS-CoV-2 infection and should not be used as the sole basis for treatment or other patient management decisions.  A negative result may occur with improper specimen collection / handling, submission of specimen other than nasopharyngeal swab, presence of viral mutation(s) within the areas targeted by this assay, and inadequate number of viral copies (<250 copies / mL). A negative result must be combined with clinical observations, patient history, and epidemiological information.  Fact Sheet for Patients:   StrictlyIdeas.no  Fact Sheet for Healthcare Providers: BankingDealers.co.za  This test is not yet approved or  cleared by the [place] FDA and has been authorized for detection and/or diagnosis of SARS-CoV-2 by FDA under an Emergency Use Authorization (EUA).  This EUA will remain in effect (meaning this test can be used) for the duration of the COVID-19 declaration under Section 564(b)(1) of the Act, 21 U.S.C. section 360bbb-3(b)(1), unless the authorization is terminated or revoked sooner.  Performed at [facility], [place] [address] [place], [place] [address]   C Difficile Quick Screen w PCR reflex     Status: None   Collection Time: 06/29/20   9:30 PM   Specimen: STOOL  Result Value Ref Range Status   C Diff antigen NEGATIVE NEGATIVE Final   C Diff toxin NEGATIVE NEGATIVE Final   C Diff interpretation No C. difficile detected.  Final    Comment: Performed at [facility], [place] [address], [place], [place] [address]  Radiology Studies: CT ABDOMEN PELVIS WO CONTRAST  Result Date: 06/29/2020 CLINICAL DATA:  Diarrhea, abdominal distension, leukocytosis EXAM: CT ABDOMEN AND PELVIS WITHOUT CONTRAST TECHNIQUE: Multidetector CT imaging of the abdomen and pelvis was performed following the standard protocol without IV contrast. COMPARISON:  08/31/2019 FINDINGS: Lower chest: The visualized lung bases are clear. There is extensive coronary artery calcification noted within the visualized heart. Cardiac size within normal limits. No pericardial effusion. Hepatobiliary: Cholelithiasis without superimposed pericholecystic inflammatory change. Liver unremarkable. No intra or extrahepatic biliary ductal dilation. 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 The adrenal glands are otherwise unremarkable. The kidneys are normal in size and position. Exophytic simple cortical cyst arises from the interpolar region of the right kidney posteriorly. Tiny exophytic hyperdense lesions are too small to characterize on this examination. No hydronephrosis. No intrarenal or ureteral calculi. The bladder is unremarkable. Stomach/Bowel: Stomach unremarkable. The small bowel is unremarkable. There is mild gaseous distension of the a proximal colon without evidence of obstruction or focal inflammation. Appendix absent. No free intraperitoneal gas or fluid. Vascular/Lymphatic: Moderate aortoiliac atherosclerotic calcification without evidence of aneurysm. Reproductive: Prostate gland and seminal vesicles are unremarkable. Other: Fluid within the rectal vault may present clinically  as diarrhea. No abdominal wall hernia Musculoskeletal: L1 vertebroplasty has been performed. Mild loss of height noted. L2-S1 posterior lumbar fusion with instrumentation has been performed. Interbody bone cages are noted at L2-3 and L3-4. Resection of the spinous processes of L2, L3, and L4 are identified. Heterotopic ossification noted posterior to L5. No acute bone abnormality. IMPRESSION: No radiographic explanation for the patient's reported symptoms. Incidental findings as noted above. Aortic Atherosclerosis (ICD10-I70.0). Electronically Signed   By: Fidela Salisbury MD   On: 06/29/2020 18:41      Scheduled Meds: . amLODipine  5 mg Oral Daily  . diclofenac Sodium  4 g Topical QID  . DULoxetine  30 mg Oral TID  . gabapentin  800 mg Oral TID  . heparin  5,000 Units Subcutaneous Q8H  . methocarbamol  750 mg Oral QID  . oxyCODONE  10 mg Oral QID  . pantoprazole  40 mg Oral Daily  . rosuvastatin  40 mg Oral QPM  . sodium chloride flush  3 mL Intravenous Once  . tamsulosin  0.4 mg Oral Daily   Continuous Infusions: . 0.9 % NaCl with KCl 20 mEq / L 100 mL/hr at 06/30/20 0600     LOS: 3 days      Debbe Odea, MD [facility] Pager: [email or web address] 06/30/2020, 11:56 AM

## 2020-06-30 NOTE — Plan of Care (Signed)
  Problem: Education: Goal: Knowledge of General Education information will improve Description: Including pain rating scale, medication(s)/side effects and non-pharmacologic comfort measures 06/30/2020 1307 by Hubert Azure, RN Outcome: Progressing 06/30/2020 1237 by Hubert Azure, RN Outcome: Progressing   Problem: Health Behavior/Discharge Planning: Goal: Ability to manage health-related needs will improve 06/30/2020 1307 by Hubert Azure, RN Outcome: Progressing 06/30/2020 1237 by Hubert Azure, RN Outcome: Progressing   Problem: Clinical Measurements: Goal: Ability to maintain clinical measurements within normal limits will improve 06/30/2020 1307 by Hubert Azure, RN Outcome: Progressing 06/30/2020 1237 by Hubert Azure, RN Outcome: Progressing Goal: Will remain free from infection 06/30/2020 1307 by Hubert Azure, RN Outcome: Progressing 06/30/2020 1237 by Hubert Azure, RN Outcome: Progressing

## 2020-06-30 NOTE — Progress Notes (Signed)
Physical Therapy Treatment Patient Details Name: Jorge Sherman MRN: [id number] DOB: 1941-04-27 Today's Date: 06/30/2020    History of Present Illness Pt admitted with N/V/diarrhea and weaknessand now with acute renal failure, Hyponatremia, and Hypokalemia.  Pt with hx of DM, spinal stenosis and back surgery x 3    PT Comments    General bed mobility comments: after much encouragement and plea bargining, pt did agree to sit EOB.  With increased time and use of rail pt was able to self transfer to seated EOB.  Pt sat EOB x 5 min at Mod Indep then self assisted back to bed moving own legs and shifting hips.General transfer comment: pt declined all attempts to transfer OOB.   Prior to admit pt was self able to get out of his bed to his Power Scooter and would sit in [employer].  Stated his LEFT knee id "bad" and has long long Hx back pain.    Follow Up Recommendations  SNF     Equipment Recommendations  None recommended by PT    Recommendations for Other Services       Precautions / Restrictions Precautions Precautions: Fall Restrictions Weight Bearing Restrictions: No    Mobility  Bed Mobility Overal bed mobility: Needs Assistance Bed Mobility: Supine to Sit           General bed mobility comments: after much encouragement and plea bargining, pt did agree to sit EOB.  With increased time and use of rail pt was able to self transfer to seated EOB.  Pt sat EOB x 5 min at Mod Indep then self assisted back to bed moving own legs and shifting hips.  Transfers                 General transfer comment: pt declined all attempts to transfer OOB  Ambulation/Gait                 Stairs             Wheelchair Mobility    Modified Rankin (Stroke Patients Only)       Balance                                            Cognition Arousal/Alertness: Awake/alert Behavior During Therapy: WFL for tasks assessed/performed Overall  Cognitive Status: Within Functional Limits for tasks assessed                                 General Comments: AxO x 3 but requires MAX encouragement to participate      Exercises      General Comments        Pertinent Vitals/Pain Pain Assessment: Faces Faces Pain Scale: Hurts a little bit Pain Location: lumbar back that radiates to left posterior buttock Pain Descriptors / Indicators: Guarding;Grimacing    Home Living                      Prior Function            PT Goals (current goals can now be found in the care plan section) Progress towards PT goals: Progressing toward goals    Frequency    Min 3X/week      PT Plan Current plan remains appropriate    Co-evaluation  AM-PAC PT "6 Clicks" Mobility   Outcome Measure  Help needed turning from your back to your side while in a flat bed without using bedrails?: A Lot Help needed moving from lying on your back to sitting on the side of a flat bed without using bedrails?: A Lot Help needed moving to and from a bed to a chair (including a wheelchair)?: A Lot Help needed standing up from a chair using your arms (e.g., wheelchair or bedside chair)?: A Lot Help needed to walk in hospital room?: Total Help needed climbing 3-5 steps with a railing? : Total 6 Click Score: 10    End of Session Equipment Utilized During Treatment: Gait belt Activity Tolerance: Other (comment) (self limiting) Patient left: in bed;with call bell/phone within reach Nurse Communication: Mobility status PT Visit Diagnosis: Difficulty in walking, not elsewhere classified (R26.2);Muscle weakness (generalized) (M62.81);Pain     Time: 1350-1410 PT Time Calculation (min) (ACUTE ONLY): 20 min  Charges:                        {Adeel Guiffre  PTA Acute  Rehabilitation Services Pager      [phone] Office      [phone]

## 2020-06-30 NOTE — Plan of Care (Signed)
  Problem: Clinical Measurements: Goal: Respiratory complications will improve Outcome: Progressing   Problem: Clinical Measurements: Goal: Cardiovascular complication will be avoided Outcome: Progressing   Problem: Coping: Goal: Level of anxiety will decrease Outcome: Progressing   Problem: Elimination: Goal: Will not experience complications related to bowel motility Outcome: Progressing   Problem: Skin Integrity: Goal: Risk for impaired skin integrity will decrease Outcome: Progressing   Problem: Safety: Goal: Ability to remain free from injury will improve Outcome: Progressing

## 2020-06-30 NOTE — Plan of Care (Signed)

## 2020-07-01 ENCOUNTER — Inpatient Hospital Stay (HOSPITAL_COMMUNITY): Payer: Medicare Other

## 2020-07-01 DIAGNOSIS — N179 Acute kidney failure, unspecified: Secondary | ICD-10-CM | POA: Diagnosis not present

## 2020-07-01 LAB — BASIC METABOLIC PANEL
Anion gap: 5 (ref 5–15)
BUN: 12 mg/dL (ref 8–23)
CO2: 24 mmol/L (ref 22–32)
Calcium: 8.3 mg/dL — ABNORMAL LOW (ref 8.9–10.3)
Chloride: 101 mmol/L (ref 98–111)
Creatinine, Ser: 0.83 mg/dL (ref 0.61–1.24)
GFR calc Af Amer: >60 mL/min (ref >60)
GFR calc non Af Amer: >60 mL/min (ref >60)
Glucose, Bld: 122 mg/dL — ABNORMAL HIGH (ref 70–99)
Potassium: 3.9 mmol/L (ref 3.5–5.1)
Sodium: 130 mmol/L — ABNORMAL LOW (ref 135–145)

## 2020-07-01 LAB — CBC
HCT: 28.9 % — ABNORMAL LOW (ref 39.0–52.0)
Hemoglobin: 9.2 g/dL — ABNORMAL LOW (ref 13.0–17.0)
MCH: 29.3 pg (ref 26.0–34.0)
MCHC: 31.8 g/dL (ref 30.0–36.0)
MCV: 92 fL (ref 80.0–100.0)
Platelets: 281 10*3/uL (ref 150–400)
RBC: 3.14 MIL/uL — ABNORMAL LOW (ref 4.22–5.81)
RDW: 14.5 % (ref 11.5–15.5)
WBC: 13.6 10*3/uL — ABNORMAL HIGH (ref 4.0–10.5)
nRBC: 0 % (ref 0.0–0.2)

## 2020-07-01 MED ORDER — LORAZEPAM 2 MG/ML IJ SOLN
INTRAMUSCULAR | Status: AC
  Filled 2020-07-01: qty 1

## 2020-07-01 MED ORDER — LORAZEPAM 2 MG/ML IJ SOLN
1.0000 mg | Freq: Once | INTRAMUSCULAR | Status: AC
  Administered 2020-07-01: 1 mg via INTRAVENOUS

## 2020-07-01 NOTE — Progress Notes (Signed)
Rn called to room due to pt complaining of gas/bloating of belly. Pt states he felt like his stomach was rolling. Pt has bowel sounds and overall assessment unchanged from start of shift. Pt stated he felt like he had a large gas "blockage" in his belly. Rn will continue to monitor.

## 2020-07-01 NOTE — Progress Notes (Signed)
Provider notified of need for additional anxiety medication for patient. Pt takes larger dose of ativan at home and on reduced dose here. Rn awaiting call back or orders to be placed in emr.

## 2020-07-01 NOTE — Progress Notes (Signed)
PROGRESS NOTE    Jorge Sherman  WUJ:[id number] DOB: Mar 22, 1941 DOA: 06/27/2020 PCP: Merrilee Seashore, MD    Brief Narrative: 79 year old male with essential hypertension, chronic back pain related to spinal stenosis, rheumatoid arthritis, OSA noncompliant with CPAP on numerous medications who lives alone and has private caretakers and presents to the hospital EMS for nausea vomiting and diarrhea for a number of days. In the ED> stated that he felt dehydrated and complained of ongoing chronic back pain skin for his Toradol.  He had episode of diarrhea in the ED. Tachycardic with a temperature of 100.6 rectally. Creatinine was noted to be elevated from his baseline and was 1.87 with a BUN of 40, sodium 133, potassium 3.4 and chloride 97. White blood cell count was 27.9 and platelets were 513. The patient was started on IV fluids for dehydration.  Stool studies were ordered.  Assessment & Plan:   Principal Problem:   AKI (acute kidney injury) ([place]) Active Problems:   Chronic pain   Essential hypertension, benign   Acute diarrhea    #1  AKI-secondary to vomiting and diarrhea and dehydration with improvement in creatinine continue IV fluids.  Patient continues to be on full liquid diet.  #2  Colonic ileus patient continues with ileus and taking narcotics and continues with abdominal distention abdominal pain on clear liquids. KUB from 06/28/2020 shows colonic ileus. Repeat KUB today.  #3 history of essential hypertension on Norvasc and Catapres  #4 chronic pain patient is on Dilaudid 0.5 mg every 3 as needed Neurontin 800 mg 3 times a day Cymbalta 40 mg 3 times a day Toradol 10 mg q. 8 Robaxin-750 milligram 4 times a day Ativan 0.53 times a day as needed Oxycodone 10 mg 4 times daily In spite of all the above medications he still continues to scream with pain Unfortunately patient is mostly bedbound and wheelchair-bound  #5 hyperlipidemia on Crestor  #6 BPH on Flomax  #7  depression on cymbalta  #8 leukocytosis resolving off antibiotics.no evidence of infection.   Estimated body mass index is 35.87 kg/m as calculated from the following:   Height as of this encounter: 5\' 10"  (1.778 m).   Weight as of this encounter: 113.4 kg.  DVT prophylaxis: heparin Code Status: full Family Communication: none Disposition Plan:  Status is: Inpatient.colonic ileus on ivf  Dispo: The patient is from:home              Anticipated d/c is to:snf              Anticipated d/c date is: unknown              Patient currently is not medically stable to d/c.    Consultants: none  Procedures: none Antimicrobials: Anti-infectives (From admission, onward)   Start     Dose/Rate Route Frequency Ordered Stop   06/30/20 2200  hydroxychloroquine (PLAQUENIL) tablet 200 mg     Discontinue     200 mg Oral 2 times daily 06/30/20 1635        Subjective: C/o abdominal pain back pain Overnight events dw staff No BM No VOMITING   Objective: Vitals:   06/30/20 0529 06/30/20 0942 06/30/20 2126 07/01/20 0501  BP: (!) 177/72 (!) 154/80 138/80 (!) 141/90  Pulse: 82 82 85 86  Resp: 18   17  Temp: 98.1 F (36.7 C)  98.7 F (37.1 C) 98.2 F (36.8 C)  TempSrc: Oral  Oral Oral  SpO2: 99%  98% 93%  Weight:  Height:        Intake/Output Summary (Last 24 hours) at 07/01/2020 1231 Last data filed at 07/01/2020 0600 Gross per 24 hour  Intake 2211.24 ml  Output 2200 ml  Net 11.24 ml   Filed Weights   06/26/20 1750  Weight: 113.4 kg    Examination:  General exam: Appears calm and comfortable  Respiratory system: Clear to auscultation. Respiratory effort normal. Cardiovascular system: S1 & S2 heard, RRR. No JVD, murmurs, rubs, gallops or clicks. No pedal edema. Gastrointestinal system: Abdomen is distended, soft and nontender. No organomegaly or masses felt. diminished bowel sounds heard. Central nervous system: Alert and oriented. No focal neurological  deficits. Extremities: Symmetric 5 x 5 power. Skin: No rashes, lesions or ulcers Psychiatry: Judgement and insight appear normal. Mood & affect appropriate.     Data Reviewed: I have personally reviewed following labs and imaging studies  CBC: Recent Labs  Lab 06/27/20 0816 06/28/20 0757 06/30/20 1507 07/01/20 0232  WBC 27.9* 20.2* 12.8* 13.6*  NEUTROABS 23.9*  --   --   --   HGB 14.3 11.4* 9.8* 9.2*  HCT 41.9 34.8* 29.2* 28.9*  MCV 86.7 90.9 90.1 92.0  PLT 513* 399 289 539   Basic Metabolic Panel: Recent Labs  Lab 06/27/20 0816 06/28/20 0757 06/28/20 1357 06/30/20 1507 07/01/20 0232  NA 133* 127* 135 132* 130*  K 3.4* 3.6 4.1 4.1 3.9  CL 97* 97* 104 102 101  CO2 18* 21* 24 23 24   GLUCOSE 189* 140* 144* 135* 122*  BUN 40* 28* 30* 12 12  CREATININE 1.87* 1.14 1.03 0.61 0.83  CALCIUM 10.1 8.3* 8.6* 8.4* 8.3*  MG  --  2.5*  --   --   --    GFR: Estimated Creatinine Clearance: 91.1 mL/min (by C-G formula based on SCr of 0.83 mg/dL). Liver Function Tests: Recent Labs  Lab 06/27/20 0816 06/28/20 0757  AST 24 21  ALT 23 17  ALKPHOS 70 52  BILITOT 0.6 0.4  PROT 8.5* 6.6  ALBUMIN 5.2* 3.7   Recent Labs  Lab 06/27/20 0816  LIPASE 41   No results for input(s): AMMONIA in the last 168 hours. Coagulation Profile: No results for input(s): INR, PROTIME in the last 168 hours. Cardiac Enzymes: No results for input(s): CKTOTAL, CKMB, CKMBINDEX, TROPONINI in the last 168 hours. BNP (last 3 results) No results for input(s): PROBNP in the last 8760 hours. HbA1C: No results for input(s): HGBA1C in the last 72 hours. CBG: No results for input(s): GLUCAP in the last 168 hours. Lipid Profile: No results for input(s): CHOL, HDL, LDLCALC, TRIG, CHOLHDL, LDLDIRECT in the last 72 hours. Thyroid Function Tests: No results for input(s): TSH, T4TOTAL, FREET4, T3FREE, THYROIDAB in the last 72 hours. Anemia Panel: No results for input(s): VITAMINB12, FOLATE, FERRITIN, TIBC,  IRON, RETICCTPCT in the last 72 hours. Sepsis Labs: Recent Labs  Lab 06/27/20 1026 06/27/20 1211  LATICACIDVEN 2.0* 1.4    Recent Results (from the past 240 hour(s))  Culture, blood (routine x 2)     Status: None (Preliminary result)   Collection Time: 06/27/20 10:27 AM   Specimen: BLOOD  Result Value Ref Range Status   Specimen Description   Final    BLOOD RIGHT ANTECUBITAL Performed at [facility], [place] [address], [place], [place] [address]    Special Requests   Final    BOTTLES DRAWN AEROBIC AND ANAEROBIC Blood Culture adequate volume Performed at [facility] [address]  [place], [place] [address]    Culture   Final    NO GROWTH 4 DAYS Performed at [facility], [place] [address] [place], [place] [address]    Report Status PENDING  Incomplete  SARS Coronavirus 2 by RT PCR (hospital order, performed in [facility] hospital lab) Nasopharyngeal Nasopharyngeal Swab     Status: None   Collection Time: 06/27/20  1:39 PM   Specimen: Nasopharyngeal Swab  Result Value Ref Range Status   SARS Coronavirus 2 NEGATIVE NEGATIVE Final    Comment: (NOTE) SARS-CoV-2 target nucleic acids are NOT DETECTED.  The SARS-CoV-2 RNA is generally detectable in upper and lower respiratory specimens during the acute phase of infection. The lowest concentration of SARS-CoV-2 viral copies this assay can detect is 250 copies / mL. A negative result does not preclude SARS-CoV-2 infection and should not be used as the sole basis for treatment or other patient management decisions.  A negative result may occur with improper specimen collection / handling, submission of specimen other than nasopharyngeal swab, presence of viral mutation(s) within the areas targeted by this assay, and inadequate number of viral copies (<250 copies / mL). A negative result must be combined with clinical observations, patient history, and epidemiological information.  Fact Sheet for  Patients:   StrictlyIdeas.no  Fact Sheet for Healthcare Providers: BankingDealers.co.za  This test is not yet approved or  cleared by the [place] FDA and has been authorized for detection and/or diagnosis of SARS-CoV-2 by FDA under an Emergency Use Authorization (EUA).  This EUA will remain in effect (meaning this test can be used) for the duration of the COVID-19 declaration under Section 564(b)(1) of the Act, 21 U.S.C. section 360bbb-3(b)(1), unless the authorization is terminated or revoked sooner.  Performed at [facility], [place] [address] [place], [place] [address]   C Difficile Quick Screen w PCR reflex     Status: None   Collection Time: 06/29/20  9:30 PM   Specimen: STOOL  Result Value Ref Range Status   C Diff antigen NEGATIVE NEGATIVE Final   C Diff toxin NEGATIVE NEGATIVE Final   C Diff interpretation No C. difficile detected.  Final    Comment: Performed at [facility], [place] [address], [place], [place] [address]  Gastrointestinal Panel by PCR , Stool     Status: None   Collection Time: 06/29/20  9:30 PM   Specimen: STOOL  Result Value Ref Range Status   Campylobacter species NOT DETECTED NOT DETECTED Final   Plesimonas shigelloides NOT DETECTED NOT DETECTED Final   Salmonella species NOT DETECTED NOT DETECTED Final   Yersinia enterocolitica NOT DETECTED NOT DETECTED Final   Vibrio species NOT DETECTED NOT DETECTED Final   Vibrio cholerae NOT DETECTED NOT DETECTED Final   Enteroaggregative E coli (EAEC) NOT DETECTED NOT DETECTED Final   Enteropathogenic E coli (EPEC) NOT DETECTED NOT DETECTED Final   Enterotoxigenic E coli (ETEC) NOT DETECTED NOT DETECTED Final   Shiga like toxin producing E coli (STEC) NOT DETECTED NOT DETECTED Final   Shigella/Enteroinvasive E coli (EIEC) NOT DETECTED NOT DETECTED Final   Cryptosporidium NOT DETECTED NOT DETECTED Final   Cyclospora  cayetanensis NOT DETECTED NOT DETECTED Final   Entamoeba histolytica NOT DETECTED NOT DETECTED Final   Giardia lamblia NOT DETECTED NOT DETECTED Final   Adenovirus F40/41 NOT DETECTED NOT DETECTED Final   Astrovirus NOT DETECTED NOT DETECTED Final   Norovirus GI/GII NOT DETECTED NOT DETECTED Final   Rotavirus  A NOT DETECTED NOT DETECTED Final   Sapovirus (I, II, IV, and V) NOT DETECTED NOT DETECTED Final    Comment: Performed at [facility], [address] [place], [place] [address]         Radiology Studies: CT ABDOMEN PELVIS WO CONTRAST  Result Date: 06/29/2020 CLINICAL DATA:  Diarrhea, abdominal distension, leukocytosis EXAM: CT ABDOMEN AND PELVIS WITHOUT CONTRAST TECHNIQUE: Multidetector CT imaging of the abdomen and pelvis was performed following the standard protocol without IV contrast. COMPARISON:  08/31/2019 FINDINGS: Lower chest: The visualized lung bases are clear. There is extensive coronary artery calcification noted within the visualized heart. Cardiac size within normal limits. No pericardial effusion. Hepatobiliary: Cholelithiasis without superimposed pericholecystic inflammatory change. Liver unremarkable. No intra or extrahepatic biliary ductal dilation. 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 The adrenal glands are otherwise unremarkable. The kidneys are normal in size and position. Exophytic simple cortical cyst arises from the interpolar region of the right kidney posteriorly. Tiny exophytic hyperdense lesions are too small to characterize on this examination. No hydronephrosis. No intrarenal or ureteral calculi. The bladder is unremarkable. Stomach/Bowel: Stomach unremarkable. The small bowel is unremarkable. There is mild gaseous distension of the a proximal colon without evidence of obstruction or focal inflammation. Appendix absent. No free intraperitoneal gas or fluid. Vascular/Lymphatic:  Moderate aortoiliac atherosclerotic calcification without evidence of aneurysm. Reproductive: Prostate gland and seminal vesicles are unremarkable. Other: Fluid within the rectal vault may present clinically as diarrhea. No abdominal wall hernia Musculoskeletal: L1 vertebroplasty has been performed. Mild loss of height noted. L2-S1 posterior lumbar fusion with instrumentation has been performed. Interbody bone cages are noted at L2-3 and L3-4. Resection of the spinous processes of L2, L3, and L4 are identified. Heterotopic ossification noted posterior to L5. No acute bone abnormality. IMPRESSION: No radiographic explanation for the patient's reported symptoms. Incidental findings as noted above. Aortic Atherosclerosis (ICD10-I70.0). Electronically Signed   By: Fidela Salisbury MD   On: 06/29/2020 18:41        Scheduled Meds: . amLODipine  5 mg Oral Daily  . cloNIDine  0.1 mg Oral BID  . diclofenac Sodium  4 g Topical QID  . DULoxetine  40 mg Oral TID  . gabapentin  800 mg Oral TID  . heparin  5,000 Units Subcutaneous Q8H  . hydroxychloroquine  200 mg Oral BID  . ketorolac  10 mg Oral Q8H  . LORazepam      . methocarbamol  750 mg Oral QID  . oxyCODONE  10 mg Oral QID  . pantoprazole  40 mg Oral Daily  . rosuvastatin  40 mg Oral QPM  . sodium chloride flush  3 mL Intravenous Once  . tamsulosin  0.4 mg Oral Daily   Continuous Infusions: . 0.9 % NaCl with KCl 20 mEq / L 50 mL/hr at 07/01/20 0058     LOS: 4 days     Georgette Shell, MD 07/01/2020, 12:31 PM

## 2020-07-02 DIAGNOSIS — N179 Acute kidney failure, unspecified: Secondary | ICD-10-CM | POA: Diagnosis not present

## 2020-07-02 LAB — URINALYSIS, ROUTINE W REFLEX MICROSCOPIC
Bilirubin Urine: NEGATIVE
Glucose, UA: NEGATIVE mg/dL
Ketones, ur: NEGATIVE mg/dL
Nitrite: POSITIVE — AB
Protein, ur: 30 mg/dL — AB
Specific Gravity, Urine: 1.013 (ref 1.005–1.030)
WBC, UA: >50 WBC/hpf — ABNORMAL HIGH (ref 0–5)
pH: 5 (ref 5.0–8.0)

## 2020-07-02 LAB — SARS CORONAVIRUS 2 BY RT PCR (HOSPITAL ORDER, PERFORMED IN ~~LOC~~ HOSPITAL LAB): SARS Coronavirus 2: NEGATIVE

## 2020-07-02 LAB — CULTURE, BLOOD (ROUTINE X 2)
Culture: NO GROWTH
Special Requests: ADEQUATE

## 2020-07-02 MED ORDER — BISACODYL 5 MG PO TBEC
10.0000 mg | DELAYED_RELEASE_TABLET | Freq: Every day | ORAL | Status: DC
  Administered 2020-07-02 – 2020-07-06 (×2): 10 mg via ORAL
  Filled 2020-07-02 (×3): qty 2

## 2020-07-02 MED ORDER — SORBITOL 70 % SOLN
960.0000 mL | TOPICAL_OIL | Freq: Once | ORAL | Status: DC
  Filled 2020-07-02: qty 473

## 2020-07-02 NOTE — Progress Notes (Signed)
OT Cancellation Note  Patient Details Name: Jorge Sherman MRN: [id number] DOB: 1941-04-13   Cancelled Treatment:    Reason Eval/Treat Not Completed: Other (comment) Patient reports "I am sick. I'm nauseous. I can't work with you today." Will f/u with patient as able.  Shakima Nisley L Caius Silbernagel 07/02/2020, 2:07 PM

## 2020-07-02 NOTE — Progress Notes (Signed)
PROGRESS NOTE    Jorge Sherman  NWG:[id number] DOB: 02-22-1941 DOA: 06/27/2020 PCP: Merrilee Seashore, MD    Brief Narrative: 79 year old male with essential hypertension, chronic back pain related to spinal stenosis, rheumatoid arthritis, OSA noncompliant with CPAP on numerous medications who lives alone and has private caretakers and presents to the hospital EMS for nausea vomiting and diarrhea for a number of days. In the ED> stated that he felt dehydrated and complained of ongoing chronic back pain skin for his Toradol.  He had episode of diarrhea in the ED. Tachycardic with a temperature of 100.6 rectally. Creatinine was noted to be elevated from his baseline and was 1.87 with a BUN of 40, sodium 133, potassium 3.4 and chloride 97. White blood cell count was 27.9 and platelets were 513. The patient was started on IV fluids for dehydration.  Stool studies were ordered.  Assessment & Plan:   Principal Problem:   AKI (acute kidney injury) ([place]) Active Problems:   Chronic pain   Essential hypertension, benign   Acute diarrhea    #1  AKI-secondary to vomiting and diarrhea and dehydration with improvement in creatinine continue IV fluids.  Labs pending for today.  #2  Colonic ileus patient continues with ileus and taking narcotics and continues with abdominal distention abdominal pain on clear liquids. KUB from 06/28/2020 shows colonic ileus. KUB 07/01/2020 diffuse colonic distention with interval improvement. Follow-up KUB in a.m.  #3 history of essential hypertension on Norvasc and Catapres  #4 chronic pain patient is on Dilaudid 0.5 mg every 3 as needed Neurontin 800 mg 3 times a day Cymbalta 40 mg 3 times a day Toradol 10 mg q. 8 Robaxin-750 milligram 4 times a day Ativan 0.53 times a day as needed Oxycodone 10 mg 4 times daily In spite of all the above medications he still continues to scream with pain Unfortunately patient is mostly bedbound and wheelchair-bound Today  he tells me Dilaudid does not do anything for him I have stopped the Dilaudid.  I have also discussed with him that these medications makes his ileus worse.  He tells me that he does not want to die in pain.  He wants to be comfortable without any pain.  I have consulted palliative care for goals of care and symptom management.  #5 hyperlipidemia on Crestor  #6 BPH on Flomax  #7 depression on cymbalta  #8 leukocytosis still persistent with no evidence of active infection noted.  Estimated body mass index is 35.87 kg/m as calculated from the following:   Height as of this encounter: 5\' 10"  (1.778 m).   Weight as of this encounter: 113.4 kg.  DVT prophylaxis: heparin Code Status: full Family Communication: none Disposition Plan:  Status is: Inpatient.colonic ileus on ivf  Dispo: The patient is from:home              Anticipated d/c is to:snf              Anticipated d/c date is: unknown              Patient currently is not medically stable to d/c.    Consultants: none  Procedures: none Antimicrobials: Anti-infectives (From admission, onward)   Start     Dose/Rate Route Frequency Ordered Stop   06/30/20 2200  hydroxychloroquine (PLAQUENIL) tablet 200 mg     Discontinue     200 mg Oral 2 times daily 06/30/20 1635        Subjective: Complains of abdominal distention and pain  Objective: Vitals:   07/01/20 0501 07/01/20 2126 07/02/20 0500 07/02/20 0927  BP: (!) 141/90 (!) 157/78 (!) 162/84 (!) 158/82  Pulse: 86 89 85 88  Resp: 17 16 18    Temp: 98.2 F (36.8 C) 98 F (36.7 C) 97.8 F (36.6 C) 98.8 F (37.1 C)  TempSrc: Oral Oral Oral Oral  SpO2: 93% 100% 97% 100%  Weight:      Height:        Intake/Output Summary (Last 24 hours) at 07/02/2020 1133 Last data filed at 07/02/2020 0750 Gross per 24 hour  Intake 889.5 ml  Output 4450 ml  Net -3560.5 ml   Filed Weights   06/26/20 1750  Weight: 113.4 kg    Examination:  General exam: Appears calm and  comfortable  Respiratory system: Clear to auscultation. Respiratory effort normal. Cardiovascular system: S1 & S2 heard, RRR. No JVD, murmurs, rubs, gallops or clicks. No pedal edema. Gastrointestinal system: Abdomen is distended, soft and nontender. No organomegaly or masses felt. diminished bowel sounds heard. Central nervous system: Alert and oriented. No focal neurological deficits. Extremities: Symmetric 5 x 5 power. Skin: No rashes, lesions or ulcers Psychiatry: Judgement and insight appear normal. Mood & affect appropriate.     Data Reviewed: I have personally reviewed following labs and imaging studies  CBC: Recent Labs  Lab 06/27/20 0816 06/28/20 0757 06/30/20 1507 07/01/20 0232  WBC 27.9* 20.2* 12.8* 13.6*  NEUTROABS 23.9*  --   --   --   HGB 14.3 11.4* 9.8* 9.2*  HCT 41.9 34.8* 29.2* 28.9*  MCV 86.7 90.9 90.1 92.0  PLT 513* 399 289 673   Basic Metabolic Panel: Recent Labs  Lab 06/27/20 0816 06/28/20 0757 06/28/20 1357 06/30/20 1507 07/01/20 0232  NA 133* 127* 135 132* 130*  K 3.4* 3.6 4.1 4.1 3.9  CL 97* 97* 104 102 101  CO2 18* 21* 24 23 24   GLUCOSE 189* 140* 144* 135* 122*  BUN 40* 28* 30* 12 12  CREATININE 1.87* 1.14 1.03 0.61 0.83  CALCIUM 10.1 8.3* 8.6* 8.4* 8.3*  MG  --  2.5*  --   --   --    GFR: Estimated Creatinine Clearance: 91.1 mL/min (by C-G formula based on SCr of 0.83 mg/dL). Liver Function Tests: Recent Labs  Lab 06/27/20 0816 06/28/20 0757  AST 24 21  ALT 23 17  ALKPHOS 70 52  BILITOT 0.6 0.4  PROT 8.5* 6.6  ALBUMIN 5.2* 3.7   Recent Labs  Lab 06/27/20 0816  LIPASE 41   No results for input(s): AMMONIA in the last 168 hours. Coagulation Profile: No results for input(s): INR, PROTIME in the last 168 hours. Cardiac Enzymes: No results for input(s): CKTOTAL, CKMB, CKMBINDEX, TROPONINI in the last 168 hours. BNP (last 3 results) No results for input(s): PROBNP in the last 8760 hours. HbA1C: No results for input(s): HGBA1C  in the last 72 hours. CBG: No results for input(s): GLUCAP in the last 168 hours. Lipid Profile: No results for input(s): CHOL, HDL, LDLCALC, TRIG, CHOLHDL, LDLDIRECT in the last 72 hours. Thyroid Function Tests: No results for input(s): TSH, T4TOTAL, FREET4, T3FREE, THYROIDAB in the last 72 hours. Anemia Panel: No results for input(s): VITAMINB12, FOLATE, FERRITIN, TIBC, IRON, RETICCTPCT in the last 72 hours. Sepsis Labs: Recent Labs  Lab 06/27/20 1026 06/27/20 1211  LATICACIDVEN 2.0* 1.4    Recent Results (from the past 240 hour(s))  Culture, blood (routine x 2)     Status: None   Collection Time:  06/27/20 10:27 AM   Specimen: BLOOD  Result Value Ref Range Status   Specimen Description   Final    BLOOD RIGHT ANTECUBITAL Performed at [facility], [place] [address] [place], [place] [address]    Special Requests   Final    BOTTLES DRAWN AEROBIC AND ANAEROBIC Blood Culture adequate volume Performed at [place] [address] [place], [place] [address]    Culture   Final    NO GROWTH 5 DAYS Performed at [facility], [place] [address], [place], [place] [address]    Report Status 07/02/2020 FINAL  Final  SARS Coronavirus 2 by RT PCR (hospital order, performed in [facility] hospital lab) Nasopharyngeal Nasopharyngeal Swab     Status: None   Collection Time: 06/27/20  1:39 PM   Specimen: Nasopharyngeal Swab  Result Value Ref Range Status   SARS Coronavirus 2 NEGATIVE NEGATIVE Final    Comment: (NOTE) SARS-CoV-2 target nucleic acids are NOT DETECTED.  The SARS-CoV-2 RNA is generally detectable in upper and lower respiratory specimens during the acute phase of infection. The lowest concentration of SARS-CoV-2 viral copies this assay can detect is 250 copies / mL. A negative result does not preclude SARS-CoV-2 infection and should not be used as the sole basis for treatment or other patient management decisions.  A negative result may occur  with improper specimen collection / handling, submission of specimen other than nasopharyngeal swab, presence of viral mutation(s) within the areas targeted by this assay, and inadequate number of viral copies (<250 copies / mL). A negative result must be combined with clinical observations, patient history, and epidemiological information.  Fact Sheet for Patients:   StrictlyIdeas.no  Fact Sheet for Healthcare Providers: BankingDealers.co.za  This test is not yet approved or  cleared by the [place] FDA and has been authorized for detection and/or diagnosis of SARS-CoV-2 by FDA under an Emergency Use Authorization (EUA).  This EUA will remain in effect (meaning this test can be used) for the duration of the COVID-19 declaration under Section 564(b)(1) of the Act, 21 U.S.C. section 360bbb-3(b)(1), unless the authorization is terminated or revoked sooner.  Performed at [facility], [place] [address] [place], [place] [address]   C Difficile Quick Screen w PCR reflex     Status: None   Collection Time: 06/29/20  9:30 PM   Specimen: STOOL  Result Value Ref Range Status   C Diff antigen NEGATIVE NEGATIVE Final   C Diff toxin NEGATIVE NEGATIVE Final   C Diff interpretation No C. difficile detected.  Final    Comment: Performed at [facility], [place] [address] [place],  [address]  Gastrointestinal Panel by PCR , Stool     Status: None   Collection Time: 06/29/20  9:30 PM   Specimen: STOOL  Result Value Ref Range Status   Campylobacter species NOT DETECTED NOT DETECTED Final   Plesimonas shigelloides NOT DETECTED NOT DETECTED Final   Salmonella species NOT DETECTED NOT DETECTED Final   Yersinia enterocolitica NOT DETECTED NOT DETECTED Final   Vibrio species NOT DETECTED NOT DETECTED Final   Vibrio cholerae NOT DETECTED NOT DETECTED Final   Enteroaggregative E coli (EAEC) NOT DETECTED NOT  DETECTED Final   Enteropathogenic E coli (EPEC) NOT DETECTED NOT DETECTED Final   Enterotoxigenic E coli (ETEC) NOT DETECTED NOT DETECTED Final   Shiga like toxin producing E coli (STEC) NOT DETECTED NOT DETECTED Final   Shigella/Enteroinvasive E coli (  EIEC) NOT DETECTED NOT DETECTED Final   Cryptosporidium NOT DETECTED NOT DETECTED Final   Cyclospora cayetanensis NOT DETECTED NOT DETECTED Final   Entamoeba histolytica NOT DETECTED NOT DETECTED Final   Giardia lamblia NOT DETECTED NOT DETECTED Final   Adenovirus F40/41 NOT DETECTED NOT DETECTED Final   Astrovirus NOT DETECTED NOT DETECTED Final   Norovirus GI/GII NOT DETECTED NOT DETECTED Final   Rotavirus A NOT DETECTED NOT DETECTED Final   Sapovirus (I, II, IV, and V) NOT DETECTED NOT DETECTED Final    Comment: Performed at [facility], [address], [place], [place] [address]         Radiology Studies: DG Abd 1 View  Result Date: 07/01/2020 CLINICAL DATA:  Distended abdomen EXAM: ABDOMEN - 1 VIEW COMPARISON:  06/28/2020 FINDINGS: Mild improvement in diffuse colonic distension. Small bowel nondilated. Multilevel lumbar fusion with hardware. Vertebroplasty cement at L1. IMPRESSION: Diffuse colonic distension with interval improvement. Probable ileus. Electronically Signed   By: Franchot Gallo M.D.   On: 07/01/2020 14:22        Scheduled Meds: . amLODipine  5 mg Oral Daily  . bisacodyl  10 mg Oral Daily  . cloNIDine  0.1 mg Oral BID  . diclofenac Sodium  4 g Topical QID  . DULoxetine  40 mg Oral TID  . gabapentin  800 mg Oral TID  . heparin  5,000 Units Subcutaneous Q8H  . hydroxychloroquine  200 mg Oral BID  . ketorolac  10 mg Oral Q8H  . methocarbamol  750 mg Oral QID  . oxyCODONE  10 mg Oral QID  . pantoprazole  40 mg Oral Daily  . rosuvastatin  40 mg Oral QPM  . sodium chloride flush  3 mL Intravenous Once  . sorbitol, milk of mag, mineral oil, glycerin (SMOG) enema  960 mL Rectal Once  . tamsulosin   0.4 mg Oral Daily   Continuous Infusions: . 0.9 % NaCl with KCl 20 mEq / L 50 mL/hr at 07/01/20 1712     LOS: 5 days     Georgette Shell, MD 07/02/2020, 11:33 AM

## 2020-07-02 NOTE — Progress Notes (Signed)
Patient received a soap suds enema this am  Per verbal order, cancelled smog. Pt has slept some today.

## 2020-07-02 NOTE — Progress Notes (Signed)
PT Cancellation Note  Patient Details Name: Jorge Sherman MRN: [id number] DOB: [birth year]   Cancelled Treatment:    Reason Eval/Treat Not Completed: Patient declined, no reason specified. Pt repeatedly states "I'm just so sick, I'm so so sick" when therapist attempted session; RN previously requested afternoon therapy due to pt having enema and resting this AM- notified RN of attempt. Frequency updated this session due to guarded participation.    Talbot Grumbling PT, DPT 07/02/20, 2:29 PM

## 2020-07-03 ENCOUNTER — Inpatient Hospital Stay (HOSPITAL_COMMUNITY): Payer: Medicare Other

## 2020-07-03 DIAGNOSIS — Z7189 Other specified counseling: Secondary | ICD-10-CM

## 2020-07-03 DIAGNOSIS — N179 Acute kidney failure, unspecified: Secondary | ICD-10-CM | POA: Diagnosis not present

## 2020-07-03 DIAGNOSIS — K567 Ileus, unspecified: Secondary | ICD-10-CM

## 2020-07-03 DIAGNOSIS — R5381 Other malaise: Secondary | ICD-10-CM | POA: Diagnosis not present

## 2020-07-03 DIAGNOSIS — Z515 Encounter for palliative care: Secondary | ICD-10-CM

## 2020-07-03 LAB — URINALYSIS, ROUTINE W REFLEX MICROSCOPIC
Bilirubin Urine: NEGATIVE
Glucose, UA: NEGATIVE mg/dL
Ketones, ur: NEGATIVE mg/dL
Nitrite: POSITIVE — AB
Protein, ur: NEGATIVE mg/dL
Specific Gravity, Urine: 1.008 (ref 1.005–1.030)
WBC, UA: >50 WBC/hpf — ABNORMAL HIGH (ref 0–5)
pH: 6 (ref 5.0–8.0)

## 2020-07-03 LAB — COMPREHENSIVE METABOLIC PANEL
ALT: 21 U/L (ref 0–44)
AST: 20 U/L (ref 15–41)
Albumin: 3.2 g/dL — ABNORMAL LOW (ref 3.5–5.0)
Alkaline Phosphatase: 50 U/L (ref 38–126)
Anion gap: 6 (ref 5–15)
BUN: 9 mg/dL (ref 8–23)
CO2: 28 mmol/L (ref 22–32)
Calcium: 8.9 mg/dL (ref 8.9–10.3)
Chloride: 100 mmol/L (ref 98–111)
Creatinine, Ser: 0.82 mg/dL (ref 0.61–1.24)
GFR calc Af Amer: >60 mL/min (ref >60)
GFR calc non Af Amer: >60 mL/min (ref >60)
Glucose, Bld: 113 mg/dL — ABNORMAL HIGH (ref 70–99)
Potassium: 4 mmol/L (ref 3.5–5.1)
Sodium: 134 mmol/L — ABNORMAL LOW (ref 135–145)
Total Bilirubin: 0.6 mg/dL (ref 0.3–1.2)
Total Protein: 5.7 g/dL — ABNORMAL LOW (ref 6.5–8.1)

## 2020-07-03 LAB — CBC
HCT: 30.5 % — ABNORMAL LOW (ref 39.0–52.0)
Hemoglobin: 9.7 g/dL — ABNORMAL LOW (ref 13.0–17.0)
MCH: 29.2 pg (ref 26.0–34.0)
MCHC: 31.8 g/dL (ref 30.0–36.0)
MCV: 91.9 fL (ref 80.0–100.0)
Platelets: 286 10*3/uL (ref 150–400)
RBC: 3.32 MIL/uL — ABNORMAL LOW (ref 4.22–5.81)
RDW: 14.6 % (ref 11.5–15.5)
WBC: 12.6 10*3/uL — ABNORMAL HIGH (ref 4.0–10.5)
nRBC: 0 % (ref 0.0–0.2)

## 2020-07-03 MED ORDER — DEXAMETHASONE SODIUM PHOSPHATE 10 MG/ML IJ SOLN
8.0000 mg | Freq: Once | INTRAMUSCULAR | Status: AC
  Administered 2020-07-03: 8 mg via INTRAVENOUS
  Filled 2020-07-03: qty 1

## 2020-07-03 MED ORDER — KETOROLAC TROMETHAMINE 30 MG/ML IJ SOLN
30.0000 mg | Freq: Once | INTRAMUSCULAR | Status: AC
  Administered 2020-07-03: 30 mg via INTRAVENOUS
  Filled 2020-07-03: qty 1

## 2020-07-03 NOTE — Progress Notes (Signed)
OT Cancellation Note  Patient Details Name: Jorge Sherman MRN: [id number] DOB: 1940-11-30   Cancelled Treatment:    Reason Eval/Treat Not Completed: Other (comment) Patient declined therapy stating "I am too sick. I'm nauseous and gonna throw up." Therapist encouraged patient to participate and educated him on the negative effects of prolonged bedrest but patient continued to decline. Will f/u as able.  Ludell Zacarias L Amna Welker 07/03/2020, 10:37 AM

## 2020-07-03 NOTE — TOC Progression Note (Addendum)
Transition of Care (TOC) - Progression Note    Patient Details  Name: Jorge Sherman MRN: [id number] Date of Birth: 03/18/1941  Transition of Care (TOC) CM/SW Contact  HOYLE, LUCY, LCSW Phone Number: 07/03/2020, 4:04 PM  Clinical Narrative:    Met with pt this afternoon to attempt to review SNFs who have offered beds.  Pt agitated and insisted he was not ready to leave the hospital and stated that he "can't even move" - then closed his eyes.  Will alert weekend TOC members to bed offers.  Have alerted MD as well to these concerns.  Palliative Care and Psychiatry have been consulted.  If patient is medically cleared he will have to choose facility or plan to return home.  Pt has used [facility] for private duty caregiver but only ~ 3 hrs per day and has been told that, if he goes home, he would need to increase coverage for safety and support.  Very difficult case.   Expected Discharge Plan: Skilled Nursing Facility Barriers to Discharge: Continued Medical Work up  Expected Discharge Plan and Services Expected Discharge Plan: Skilled Nursing Facility In-house Referral: Clinical Social Work     Living arrangements for the past 2 months: Single Family Home                                       Social Determinants of Health (SDOH) Interventions    Readmission Risk Interventions Readmission Risk Prevention Plan 06/29/2020  Transportation Screening Complete  Home Care Screening Complete  Medication Review (RN CM) Complete  Some recent data might be hidden   

## 2020-07-03 NOTE — Progress Notes (Signed)
Clean catch urine sent for analysis.

## 2020-07-03 NOTE — Progress Notes (Signed)
PROGRESS NOTE    Jorge Sherman  DJS:[id number] DOB: [birth year] DOA: 06/27/2020 PCP: Merrilee Seashore, MD    Brief Narrative: 79 year old male with essential hypertension, chronic back pain related to spinal stenosis, rheumatoid arthritis, OSA noncompliant with CPAP on numerous medications who lives alone and has private caretakers and presents to the hospital EMS for nausea vomiting and diarrhea for a number of days. In the ED> stated that he felt dehydrated and complained of ongoing chronic back pain skin for his Toradol.  He had episode of diarrhea in the ED. Tachycardic with a temperature of 100.6 rectally. Creatinine was noted to be elevated from his baseline and was 1.87 with a BUN of 40, sodium 133, potassium 3.4 and chloride 97. White blood cell count was 27.9 and platelets were 513. The patient was started on IV fluids for dehydration.  Stool studies were ordered.  Assessment & Plan:   Principal Problem:   AKI (acute kidney injury) ([place]) Active Problems:   Chronic pain   Essential hypertension, benign   Acute diarrhea    #1  AKI-secondary to vomiting and diarrhea and dehydration with improvement in creatinine continue IV fluids.  Patient has been refusing labs.  Discussed with patient's daughter who agreed to have the patient allow [place] to draw labs.  #2  Colonic ileus -with some improvement.  Had bowel movements with enema yesterday.  Abdomen still distended though some of this is chronic.  He continues to take narcotics dose the dose has been decreased and all the IV narcotics have been stopped.  He continues to request for pain medications asking for morphine.  I explained to him many times that the more narcotics he takes more the ileus is going to get worse.  He reports that he does not want to be in pain.  Discussed with daughter who thinks that he is severely depressed and is asking for psych evaluation.  KUB from today continues with ileus but improved.  Patient  complaining of nausea.  Continue Zofran.  UA with high WBCs and leukocyte esterase.  Culture pending.  #3 history of essential hypertension on Norvasc and Catapres  #4 chronic pain-I have stopped his Dilaudid.   Neurontin 800 mg 3 times a day Cymbalta 40 mg 3 times a day Toradol 10 mg q. 8 Robaxin-750 milligram 4 times a day Ativan 0.5 --3 times a day as needed Oxycodone 10 mg 4 times daily In spite of all the above medications he still continues to scream with pain Unfortunately patient is mostly bedbound and wheelchair-bound  #5 hyperlipidemia on Crestor  #6 BPH on Flomax  #7 depression on cymbalta  #8 leukocytosis still persistent with no evidence of active infection noted.  UA appears dirty.  Positive for nitrites leukocyte Estrace and more than 50 WBCs.  Estimated body mass index is 35.87 kg/m as calculated from the following:   Height as of this encounter: 5\' 10"  (1.778 m).   Weight as of this encounter: 113.4 kg.  DVT prophylaxis: heparin Code Status: full Family Communication: none Disposition Plan:  Status is: Inpatient.colonic ileus on ivf  Dispo: The patient is from:home              Anticipated d/c is to:snf              Anticipated d/c date is: unknown              Patient currently is not medically stable to d/c.    Consultants: none  Procedures: none  Antimicrobials: Anti-infectives (From admission, onward)   Start     Dose/Rate Route Frequency Ordered Stop   06/30/20 2200  hydroxychloroquine (PLAQUENIL) tablet 200 mg     Discontinue     200 mg Oral 2 times daily 06/30/20 1635        Subjective: Patient continues to scream with abdominal pain continues to complain of nausea.  Had bowel movements.  Daughter reported he had multiple episodes of UTI in the past.   Objective: Vitals:   07/02/20 0927 07/02/20 1301 07/02/20 2108 07/03/20 0353  BP: (!) 158/82 137/72 127/69 (!) 147/81  Pulse: 88 85 90 79  Resp:  19 16 16   Temp: 98.8 F (37.1 C) 98.7 F  (37.1 C) 98.3 F (36.8 C) 98.5 F (36.9 C)  TempSrc: Oral Oral    SpO2: 100% 96% 99% 97%  Weight:      Height:        Intake/Output Summary (Last 24 hours) at 07/03/2020 1255 Last data filed at 07/03/2020 0521 Gross per 24 hour  Intake 1504.84 ml  Output 1850 ml  Net -345.16 ml   Filed Weights   06/26/20 1750  Weight: 113.4 kg    Examination:  General exam: Appears calm and comfortable  Respiratory system: Clear to auscultation. Respiratory effort normal. Cardiovascular system: S1 & S2 heard, RRR. No JVD, murmurs, rubs, gallops or clicks. No pedal edema. Gastrointestinal system: Abdomen is distended, soft and nontender. No organomegaly or masses felt. diminished bowel sounds heard. Central nervous system: Alert and oriented. No focal neurological deficits. Extremities: Symmetric 5 x 5 power. Skin: No rashes, lesions or ulcers Psychiatry: Judgement and insight appear normal. Mood & affect appropriate.     Data Reviewed: I have personally reviewed following labs and imaging studies  CBC: Recent Labs  Lab 06/27/20 0816 06/28/20 0757 06/30/20 1507 07/01/20 0232  WBC 27.9* 20.2* 12.8* 13.6*  NEUTROABS 23.9*  --   --   --   HGB 14.3 11.4* 9.8* 9.2*  HCT 41.9 34.8* 29.2* 28.9*  MCV 86.7 90.9 90.1 92.0  PLT 513* 399 289 732   Basic Metabolic Panel: Recent Labs  Lab 06/27/20 0816 06/28/20 0757 06/28/20 1357 06/30/20 1507 07/01/20 0232  NA 133* 127* 135 132* 130*  K 3.4* 3.6 4.1 4.1 3.9  CL 97* 97* 104 102 101  CO2 18* 21* 24 23 24   GLUCOSE 189* 140* 144* 135* 122*  BUN 40* 28* 30* 12 12  CREATININE 1.87* 1.14 1.03 0.61 0.83  CALCIUM 10.1 8.3* 8.6* 8.4* 8.3*  MG  --  2.5*  --   --   --    GFR: Estimated Creatinine Clearance: 91.1 mL/min (by C-G formula based on SCr of 0.83 mg/dL). Liver Function Tests: Recent Labs  Lab 06/27/20 0816 06/28/20 0757  AST 24 21  ALT 23 17  ALKPHOS 70 52  BILITOT 0.6 0.4  PROT 8.5* 6.6  ALBUMIN 5.2* 3.7   Recent Labs    Lab 06/27/20 0816  LIPASE 41   No results for input(s): AMMONIA in the last 168 hours. Coagulation Profile: No results for input(s): INR, PROTIME in the last 168 hours. Cardiac Enzymes: No results for input(s): CKTOTAL, CKMB, CKMBINDEX, TROPONINI in the last 168 hours. BNP (last 3 results) No results for input(s): PROBNP in the last 8760 hours. HbA1C: No results for input(s): HGBA1C in the last 72 hours. CBG: No results for input(s): GLUCAP in the last 168 hours. Lipid Profile: No results for input(s): CHOL, HDL, LDLCALC, TRIG,  CHOLHDL, LDLDIRECT in the last 72 hours. Thyroid Function Tests: No results for input(s): TSH, T4TOTAL, FREET4, T3FREE, THYROIDAB in the last 72 hours. Anemia Panel: No results for input(s): VITAMINB12, FOLATE, FERRITIN, TIBC, IRON, RETICCTPCT in the last 72 hours. Sepsis Labs: Recent Labs  Lab 06/27/20 1026 06/27/20 1211  LATICACIDVEN 2.0* 1.4    Recent Results (from the past 240 hour(s))  Culture, blood (routine x 2)     Status: None   Collection Time: 06/27/20 10:27 AM   Specimen: BLOOD  Result Value Ref Range Status   Specimen Description   Final    BLOOD RIGHT ANTECUBITAL Performed at [facility], [place] [address], [place], [place] [address]    Special Requests   Final    BOTTLES DRAWN AEROBIC AND ANAEROBIC Blood Culture adequate volume Performed at [place] [address], [place], [place] [address]    Culture   Final    NO GROWTH 5 DAYS Performed at [facility], [place] [address] [place], [place] [address]    Report Status 07/02/2020 FINAL  Final  SARS Coronavirus 2 by RT PCR (hospital order, performed in [facility] hospital lab) Nasopharyngeal Nasopharyngeal Swab     Status: None   Collection Time: 06/27/20  1:39 PM   Specimen: Nasopharyngeal Swab  Result Value Ref Range Status   SARS Coronavirus 2 NEGATIVE NEGATIVE Final    Comment: (NOTE) SARS-CoV-2 target nucleic acids are NOT DETECTED.  The  SARS-CoV-2 RNA is generally detectable in upper and lower respiratory specimens during the acute phase of infection. The lowest concentration of SARS-CoV-2 viral copies this assay can detect is 250 copies / mL. A negative result does not preclude SARS-CoV-2 infection and should not be used as the sole basis for treatment or other patient management decisions.  A negative result may occur with improper specimen collection / handling, submission of specimen other than nasopharyngeal swab, presence of viral mutation(s) within the areas targeted by this assay, and inadequate number of viral copies (<250 copies / mL). A negative result must be combined with clinical observations, patient history, and epidemiological information.  Fact Sheet for Patients:   StrictlyIdeas.no  Fact Sheet for Healthcare Providers: BankingDealers.co.za  This test is not yet approved or  cleared by the [place] FDA and has been authorized for detection and/or diagnosis of SARS-CoV-2 by FDA under an Emergency Use Authorization (EUA).  This EUA will remain in effect (meaning this test can be used) for the duration of the COVID-19 declaration under Section 564(b)(1) of the Act, 21 U.S.C. section 360bbb-3(b)(1), unless the authorization is terminated or revoked sooner.  Performed at [facility], [place] [address], [place], [place] [address]   C Difficile Quick Screen w PCR reflex     Status: None   Collection Time: 06/29/20  9:30 PM   Specimen: STOOL  Result Value Ref Range Status   C Diff antigen NEGATIVE NEGATIVE Final   C Diff toxin NEGATIVE NEGATIVE Final   C Diff interpretation No C. difficile detected.  Final    Comment: Performed at [facility], [place] [address] [place], [place] [address]  Gastrointestinal Panel by PCR , Stool     Status: None   Collection Time: 06/29/20  9:30 PM   Specimen: STOOL  Result Value Ref  Range Status   Campylobacter species NOT DETECTED NOT DETECTED Final   Plesimonas shigelloides NOT DETECTED NOT DETECTED Final   Salmonella species NOT DETECTED NOT  DETECTED Final   Yersinia enterocolitica NOT DETECTED NOT DETECTED Final   Vibrio species NOT DETECTED NOT DETECTED Final   Vibrio cholerae NOT DETECTED NOT DETECTED Final   Enteroaggregative E coli (EAEC) NOT DETECTED NOT DETECTED Final   Enteropathogenic E coli (EPEC) NOT DETECTED NOT DETECTED Final   Enterotoxigenic E coli (ETEC) NOT DETECTED NOT DETECTED Final   Shiga like toxin producing E coli (STEC) NOT DETECTED NOT DETECTED Final   Shigella/Enteroinvasive E coli (EIEC) NOT DETECTED NOT DETECTED Final   Cryptosporidium NOT DETECTED NOT DETECTED Final   Cyclospora cayetanensis NOT DETECTED NOT DETECTED Final   Entamoeba histolytica NOT DETECTED NOT DETECTED Final   Giardia lamblia NOT DETECTED NOT DETECTED Final   Adenovirus F40/41 NOT DETECTED NOT DETECTED Final   Astrovirus NOT DETECTED NOT DETECTED Final   Norovirus GI/GII NOT DETECTED NOT DETECTED Final   Rotavirus A NOT DETECTED NOT DETECTED Final   Sapovirus (I, II, IV, and V) NOT DETECTED NOT DETECTED Final    Comment: Performed at [facility], [place] [place], [place] [address]  SARS Coronavirus 2 by RT PCR (hospital order, performed in [place] hospital lab) Nasopharyngeal Nasopharyngeal Swab     Status: None   Collection Time: 07/02/20  4:41 PM   Specimen: Nasopharyngeal Swab  Result Value Ref Range Status   SARS Coronavirus 2 NEGATIVE NEGATIVE Final    Comment: (NOTE) SARS-CoV-2 target nucleic acids are NOT DETECTED.  The SARS-CoV-2 RNA is generally detectable in upper and lower respiratory specimens during the acute phase of infection. The lowest concentration of SARS-CoV-2 viral copies this assay can detect is 250 copies / mL. A negative result does not preclude SARS-CoV-2 infection and should not be used as the sole basis for  treatment or other patient management decisions.  A negative result may occur with improper specimen collection / handling, submission of specimen other than nasopharyngeal swab, presence of viral mutation(s) within the areas targeted by this assay, and inadequate number of viral copies (<250 copies / mL). A negative result must be combined with clinical observations, patient history, and epidemiological information.  Fact Sheet for Patients:   StrictlyIdeas.no  Fact Sheet for Healthcare Providers: BankingDealers.co.za  This test is not yet approved or  cleared by the [place] FDA and has been authorized for detection and/or diagnosis of SARS-CoV-2 by FDA under an Emergency Use Authorization (EUA).  This EUA will remain in effect (meaning this test can be used) for the duration of the COVID-19 declaration under Section 564(b)(1) of the Act, 21 U.S.C. section 360bbb-3(b)(1), unless the authorization is terminated or revoked sooner.  Performed at [facility], [place] [address], [place], [place] [address]          Radiology Studies: DG Abd 1 View  Result Date: 07/03/2020 CLINICAL DATA:  Distended abdomen. EXAM: ABDOMEN - 1 VIEW COMPARISON:  None. FINDINGS: Diffuse colonic distension is similar the prior exam. No free air is present. IMPRESSION: Similar appearance of diffuse colonic distension. Electronically Signed   By: San Morelle M.D.   On: 07/03/2020 08:00   DG Abd 1 View  Result Date: 07/01/2020 CLINICAL DATA:  Distended abdomen EXAM: ABDOMEN - 1 VIEW COMPARISON:  06/28/2020 FINDINGS: Mild improvement in diffuse colonic distension. Small bowel nondilated. Multilevel lumbar fusion with hardware. Vertebroplasty cement at L1. IMPRESSION: Diffuse colonic distension with interval improvement. Probable ileus. Electronically Signed   By: Franchot Gallo M.D.   On: 07/01/2020 14:22  Scheduled  Meds: . amLODipine  5 mg Oral Daily  . bisacodyl  10 mg Oral Daily  . cloNIDine  0.1 mg Oral BID  . diclofenac Sodium  4 g Topical QID  . DULoxetine  40 mg Oral TID  . gabapentin  800 mg Oral TID  . heparin  5,000 Units Subcutaneous Q8H  . hydroxychloroquine  200 mg Oral BID  . ketorolac  10 mg Oral Q8H  . methocarbamol  750 mg Oral QID  . oxyCODONE  10 mg Oral QID  . pantoprazole  40 mg Oral Daily  . rosuvastatin  40 mg Oral QPM  . sodium chloride flush  3 mL Intravenous Once  . sorbitol, milk of mag, mineral oil, glycerin (SMOG) enema  960 mL Rectal Once  . tamsulosin  0.4 mg Oral Daily   Continuous Infusions: . 0.9 % NaCl with KCl 20 mEq / L 50 mL/hr at 07/01/20 1712     LOS: 6 days     Georgette Shell, MD 07/03/2020, 12:55 PM

## 2020-07-03 NOTE — Progress Notes (Signed)
PT Cancellation Note  Patient Details Name: Jorge Sherman MRN: [id number] DOB: February 02, 1941   Cancelled Treatment:     Pt declined to participate despite several attempts to negotiate, plea or bargain.  Pt started to get agitated.  "I am NOT getting out of this bed".     Nathanial Rancher 07/03/2020, 1:21 PM

## 2020-07-03 NOTE — Care Management Important Message (Signed)
Important Message  Patient Details IM Letter given to the Patient Name: Jorge Sherman MRN: [id number] Date of Birth: [birth year]   Medicare Important Message Given:  Yes     Kerin Salen 07/03/2020, 8:56 AM

## 2020-07-03 NOTE — Consult Note (Signed)
Consultation Note Date: 07/03/2020   Patient Name: Jorge Sherman  DOB: July 24, 1941  MRN: [id number]  Age / Sex: 79 y.o., male  PCP: Jorge Seashore, MD Referring Physician: Georgette Shell, MD  Reason for Consultation: Establishing goals of care, pain control  HPI/Patient Profile: 79 y.o. male  with past medical history of hypertension, sleep apnea doesn't use CPAP, chronic back pain, lumbar spinal stenosis, rheumatoid arthritis, diabetes, BPH, depression admitted on 06/27/2020 from home with caregivers with acute diarrhea with nausea and concern for dehydration. Admitted with acute renal failure, hyponatremia/hypokalemia, and now with colonic ileus. Abd xray shows no progression of ileus today (previous exams show mild improvement). Frequent ED visits for uncontrolled pain and requests for Toradol which he reported is helpful. He is noted to continue with severe and uncontrolled pain and expressed desire for comfort care. Palliative consulted to address [place] and pain management.   Clinical Assessment and Goals of Care: I met today at Jorge Sherman' bedside. No family present. He awakens and speaks with me. Answers all my questions appropriately but voice garbled and difficult to understand at times. He appears oriented but with altered insight and judgement. He does not seem to have very good understanding of his diagnosis or treatment plan.We discussed his pain which he describes as unbearable and he is unable to participate with therapy given his pain. Discussed addition of higher dose toradol and steroid trial to see if we can get better control. Discussed that opioids make his abd issues/ileus worse and that he needs to [facility] with therapy to help his ileus. He does express desire for DNR and gives me permission to call daughter to discuss plans. He does not express desire for comfort care but for  improve pain control and hope for improvement.   I called and spoke with daughter, Jorge Sherman. Jorge Sherman shares that she is concerned that her father has been expressing desire to die for some time (I was clear that he did not express this to me today). Jorge Sherman tells me that she is concerned that her father is making decisions out of depression and that family have been trying to encourage him that he has a lot to live for. He was recently divorced this summer and she is concerned he is lonely. He has also had complications of incontinence and weakness from previous back surgery and chronic pain. He has caregivers for 3 hours 4 days per week. He does not have enough assistance to return home immediately and she is hopeful for rehab placement at [facility] in [place].   We spent some time discussing the difference in suicidal thoughts vs declining health and quality of life. I explained that sometimes people do reach a state of health where they hurt and feel bad a lot of the time and if they do not have an acceptable quality of life than all the depression medications in the world cannot make this better. I also discussed that Jorge Sherman did not express desire to die but to improve  today. He is not motivated to participate in therapy and even take blood draws at times. Jorge Sherman would like to be called to assist if he is refusing. She feels that he has a lot to live for and needs to be treated for depression to recognize this fact. She says that she knows his wishes and that he would want DNR if he were to have a catastrophic heart attack or stroke for instance but not at this time. She knows that he would not want to live prolonged on life support and family would choose to stop this if not expected to improve. However, she does not feel that would be his wishes for right now.   Plan for attempts for improved pain management. Will need encouragement to participate with therapy - call daughter,  Jorge Sherman, if he refuses. Hopeful for improvement. Desire for outpatient palliative to follow. Psych consult - hopefully they can speak to his capacity to make decisions.   Primary Decision Maker PATIENT    SUMMARY OF RECOMMENDATIONS   - Conflicting stories for DNR desire - will need further discussion - Trial of toradol and steroids for pain control  Code Status/Advance Care Planning:  Full code   Symptom Management:   Chronic back pain with spinal stenosis: Toradol 30 mg IV x 1. Decadron IV 8 mg x 1. Continue Cymbalta, gabapentin, Robaxin, OxyIR.   Palliative Prophylaxis:   Aspiration, Bowel Regimen, Delirium Protocol, Frequent Pain Assessment, Oral Care and Turn Reposition  Additional Recommendations (Limitations, Scope, Preferences):  Full Scope Treatment  Psycho-social/Spiritual:   Desire for further Chaplaincy support:no  Additional Recommendations: Caregiving  Support/Resources  Prognosis:   Overall prognosis poor with recurrent infections related to declined functional status.   Discharge Planning: [place] for rehab with Palliative care service follow-up      Primary Diagnoses: Present on Admission: . AKI (acute kidney injury) ([place]) . Acute diarrhea . Chronic pain . Essential hypertension, benign   I have reviewed the medical record, interviewed the patient and family, and examined the patient. The following aspects are pertinent.  Past Medical History:  Diagnosis Date  . Chronic pain   . Depressive disorder, not elsewhere classified   . Dizziness and giddiness   . Essential and other specified forms of tremor   . Headache(784.0)   . Leukocytosis, unspecified   . Obstructive sleep apnea (adult) (pediatric)   . Pain in joint, lower leg   . Septic joint of left knee joint ([place]) 04/22/2019  . Spinal stenosis, lumbar region, without neurogenic claudication   . Thoracic or lumbosacral neuritis or radiculitis, unspecified   . Type II or  unspecified type diabetes mellitus without mention of complication, not stated as uncontrolled   . Unspecified cardiovascular disease   . Unspecified essential hypertension    Social History   Socioeconomic History  . Marital status: Legally Separated    Spouse name: Kennyth Lose  . Number of children: 4  . Years of education: Not on file  . Highest education level: Not on file  Occupational History    Employer: DISABLED  Tobacco Use  . Smoking status: Former Research scientist (life sciences)  . Smokeless tobacco: Never Used  Substance and Sexual Activity  . Alcohol use: No  . Drug use: No  . Sexual activity: Not Currently  Other Topics Concern  . Not on file  Social History Narrative   ** Merged History Encounter **       Patient lives at home with wife.    Patient  has 4 children.         Social Determinants of Health   Financial Resource Strain:   . Difficulty of Paying Living Expenses:   Food Insecurity:   . Worried About Charity fundraiser in the Last Year:   . Arboriculturist in the Last Year:   Transportation Needs:   . Film/video editor (Medical):   [name] Lack of Transportation (Non-Medical):   Physical Activity:   . Days of Exercise per Week:   . Minutes of Exercise per Session:   Stress:   . Feeling of Stress :   Social Connections:   . Frequency of Communication with Friends and Family:   . Frequency of Social Gatherings with Friends and Family:   . Attends Religious Services:   . Active Member of Clubs or Organizations:   . Attends Archivist Meetings:   [name] Marital Status:    Family History  Problem Relation Age of Onset  . Cancer Father   . Heart disease Father   . Stroke Mother   . Diabetes Mother    Scheduled Meds: . amLODipine  5 mg Oral Daily  . bisacodyl  10 mg Oral Daily  . cloNIDine  0.1 mg Oral BID  . diclofenac Sodium  4 g Topical QID  . DULoxetine  40 mg Oral TID  . gabapentin  800 mg Oral TID  . heparin  5,000 Units Subcutaneous Q8H  .  hydroxychloroquine  200 mg Oral BID  . ketorolac  10 mg Oral Q8H  . methocarbamol  750 mg Oral QID  . oxyCODONE  10 mg Oral QID  . pantoprazole  40 mg Oral Daily  . rosuvastatin  40 mg Oral QPM  . sodium chloride flush  3 mL Intravenous Once  . sorbitol, milk of mag, mineral oil, glycerin (SMOG) enema  960 mL Rectal Once  . tamsulosin  0.4 mg Oral Daily   Continuous Infusions: . 0.9 % NaCl with KCl 20 mEq / L 50 mL/hr at 07/01/20 1712   PRN Meds:.acetaminophen **OR** acetaminophen, alum & mag hydroxide-simeth, hydrALAZINE, ipratropium-albuterol, LORazepam, ondansetron **OR** ondansetron (ZOFRAN) IV Allergies  Allergen Reactions  . Cefepime Rash  . Iohexol Hives and Other (See Comments)    A few hives post CT  . Fentanyl Itching, Nausea And Vomiting and Other (See Comments)    Patient states that it happened over 30 yrs ago  . Iodine Rash   Review of Systems  Constitutional: Positive for activity change, appetite change and fatigue.  Respiratory: Negative for shortness of breath.   Gastrointestinal: Positive for abdominal distention, abdominal pain and nausea.  Musculoskeletal: Positive for back pain.  Neurological: Positive for weakness.    Physical Exam Vitals and nursing note reviewed.  Constitutional:      General: He is not in acute distress.    Appearance: He is obese. He is ill-appearing.  Cardiovascular:     Rate and Rhythm: Normal rate.  Pulmonary:     Effort: Pulmonary effort is normal. No tachypnea, accessory muscle usage or respiratory distress.  Abdominal:     General: There is distension.     Palpations: Abdomen is soft.     Tenderness: There is abdominal tenderness.  Skin:    Coloration: Skin is pale.  Neurological:     Mental Status: He is alert.     Comments: Oriented to person and place but not to situation (repeats questions regarding his diagnosis and treatment plan)  Psychiatric:  Comments: Impaired insight and judgement     Vital Signs: BP  (!) 147/81 (BP Location: Left Arm)   Pulse 79   Temp 98.5 F (36.9 C)   Resp 16   Ht 5' 10"  (1.778 m)   Wt 113.4 kg   SpO2 97%   BMI 35.87 kg/m  Pain Scale: 0-10 POSS *See Group Information*: 1-Acceptable,Awake and alert Pain Score: 9    SpO2: SpO2: 97 % O2 Device:SpO2: 97 % O2 Flow Rate: .   IO: Intake/output summary:   Intake/Output Summary (Last 24 hours) at 07/03/2020 1143 Last data filed at 07/03/2020 8727 Gross per 24 hour  Intake 1504.84 ml  Output 1850 ml  Net -345.16 ml    LBM: Last BM Date: 07/02/20 Baseline Weight: Weight: 113.4 kg Most recent weight: Weight: 113.4 kg     Palliative Assessment/Data:     Time In: 1200 Time Out: 1310 Time Total: 70 min Greater than 50%  of this time was spent counseling and coordinating care related to the above assessment and plan.  Signed by: Vinie Sill, NP Palliative Medicine Team Pager # [phone] (M-F 8a-5p) Team Phone # [phone] (Nights/Weekends)

## 2020-07-03 NOTE — Consult Note (Signed)
Telepsych Consultation   Reason for Consult:  "severe depression" Referring Physician:  Dr. Rodena Piety Location of Patient: [facility] [phone] Location of Provider: [facility]  Patient Identification: Jorge Sherman MRN:  [id number] Principal Diagnosis: AKI (acute kidney injury) [facility]) Diagnosis:  Principal Problem:   AKI (acute kidney injury) ([place]) Active Problems:   Chronic pain   Essential hypertension, benign   Acute diarrhea   Total Time spent with patient: 30 minutes  Subjective:   Jorge Sherman is a 79 y.o. male patient.  Patient states "I have never been treated for my mood before, I am prescribed Cymbalta by my primary care physician."  HPI:   Patient is a 79 year old male admitted with diarrhea and fever.  Patient reports history of chronic pain.  Patient reports difficulty with ambulation and uses wheelchair for approximately 1.5 years.  Patient reports he has caretakers from a home health agency who assist him as he lives home alone.  Patient resides in [place].  Patient reports he lives in a ground-floor condo with handicap parking and is able to maneuver his wheelchair from the car to his home easily.  Patient denies access to weapons.  Patient reports abdominal son and daughter who are supportive.  Patient reports he is retired from [employer].  Patient denies alcohol and substance use, denies history of alcohol and substance use.  Patient reports sleeping approximately 6 to 8 hours, 8 hours most nights.  Patient reports appetite is "always pretty good."  Patient reports some frustration with inability to drive.  Patient reports he has moved to a condo across town and would like to be able to "get in the car and go wherever I want."  Patient reports he depends on caregivers to drive him this limits the number of trips he makes.  Patient reports neighbors who come by but sometimes "I can get lonesome."  Discussed outpatient talk therapy/counseling via  telepsychiatry format, patient agrees with this plan.  Patient assessed by nurse practitioner.  Patient alert and oriented x4.  Patient pleasant cooperative during assessment.  Patient denies suicidal ideations.  Patient denies history of self-harm behaviors, denies history of suicide attempts.  Patient denies homicidal ideations.  Patient reports his mood is "7/10."  Patient reports mood is average.  Affect is congruent and appropriate.  Patient denies auditory and visual hallucinations.  There is no indication that he is responding to internal stimuli.  There is no evidence of delusional thought content.  Patient denies symptoms of paranoia.    Past Psychiatric History: Depressive disorder  Risk to Self:  Denies Risk to Others:  Denies Prior Inpatient Therapy:  None reported Prior Outpatient Therapy:  None reported  Past Medical History:  Past Medical History:  Diagnosis Date  . Chronic pain   . Depressive disorder, not elsewhere classified   . Dizziness and giddiness   . Essential and other specified forms of tremor   . Headache(784.0)   . Leukocytosis, unspecified   . Obstructive sleep apnea (adult) (pediatric)   . Pain in joint, lower leg   . Septic joint of left knee joint ([place]) 04/22/2019  . Spinal stenosis, lumbar region, without neurogenic claudication   . Thoracic or lumbosacral neuritis or radiculitis, unspecified   . Type II or unspecified type diabetes mellitus without mention of complication, not stated as uncontrolled   . Unspecified cardiovascular disease   . Unspecified essential hypertension     Past Surgical History:  Procedure Laterality Date  . BACK SURGERY    .  back surgey     times 3   Family History:  Family History  Problem Relation Age of Onset  . Cancer Father   . Heart disease Father   . Stroke Mother   . Diabetes Mother    Family Psychiatric  History: None reported Social History:  Social History   Substance and Sexual Activity  Alcohol Use  No     Social History   Substance and Sexual Activity  Drug Use No    Social History   Socioeconomic History  . Marital status: Legally Separated    Spouse name: Kennyth Lose  . Number of children: 4  . Years of education: Not on file  . Highest education level: Not on file  Occupational History    Employer: DISABLED  Tobacco Use  . Smoking status: Former Research scientist (life sciences)  . Smokeless tobacco: Never Used  Substance and Sexual Activity  . Alcohol use: No  . Drug use: No  . Sexual activity: Not Currently  Other Topics Concern  . Not on file  Social History Narrative   ** Merged History Encounter **       Patient lives at home with wife.    Patient has 4 children.         Social Determinants of Health   Financial Resource Strain:   . Difficulty of Paying Living Expenses:   Food Insecurity:   . Worried About Charity fundraiser in the Last Year:   . Arboriculturist in the Last Year:   Transportation Needs:   . Film/video editor (Medical):   [name] Lack of Transportation (Non-Medical):   Physical Activity:   . Days of Exercise per Week:   . Minutes of Exercise per Session:   Stress:   . Feeling of Stress :   Social Connections:   . Frequency of Communication with Friends and Family:   . Frequency of Social Gatherings with Friends and Family:   . Attends Religious Services:   . Active Member of Clubs or Organizations:   . Attends Archivist Meetings:   [name] Marital Status:    Additional Social History:    Allergies:   Allergies  Allergen Reactions  . Cefepime Rash  . Iohexol Hives and Other (See Comments)    A few hives post CT  . Fentanyl Itching, Nausea And Vomiting and Other (See Comments)    Patient states that it happened over 30 yrs ago  . Iodine Rash    Labs:  Results for orders placed or performed during the hospital encounter of 06/27/20 (from the past 48 hour(s))  SARS Coronavirus 2 by RT PCR (hospital order, performed in [facility] hospital lab)  Nasopharyngeal Nasopharyngeal Swab     Status: None   Collection Time: 07/02/20  4:41 PM   Specimen: Nasopharyngeal Swab  Result Value Ref Range   SARS Coronavirus 2 NEGATIVE NEGATIVE    Comment: (NOTE) SARS-CoV-2 target nucleic acids are NOT DETECTED.  The SARS-CoV-2 RNA is generally detectable in upper and lower respiratory specimens during the acute phase of infection. The lowest concentration of SARS-CoV-2 viral copies this assay can detect is 250 copies / mL. A negative result does not preclude SARS-CoV-2 infection and should not be used as the sole basis for treatment or other patient management decisions.  A negative result may occur with improper specimen collection / handling, submission of specimen other than nasopharyngeal swab, presence of viral mutation(s) within the areas targeted by this assay, and inadequate  number of viral copies (<250 copies / mL). A negative result must be combined with clinical observations, patient history, and epidemiological information.  Fact Sheet for Patients:   StrictlyIdeas.no  Fact Sheet for Healthcare Providers: BankingDealers.co.za  This test is not yet approved or  cleared by the [place] FDA and has been authorized for detection and/or diagnosis of SARS-CoV-2 by FDA under an Emergency Use Authorization (EUA).  This EUA will remain in effect (meaning this test can be used) for the duration of the COVID-19 declaration under Section 564(b)(1) of the Act, 21 U.S.C. section 360bbb-3(b)(1), unless the authorization is terminated or revoked sooner.  Performed at [facility], [place] [address] [place], [place] [address]   Urinalysis, Routine w reflex microscopic Urine, Clean Catch     Status: Abnormal   Collection Time: 07/02/20  5:21 PM  Result Value Ref Range   Color, Urine YELLOW YELLOW   APPearance HAZY (A) CLEAR   Specific Gravity, Urine 1.013 1.005 - 1.030    pH 5.0 5.0 - 8.0   Glucose, UA NEGATIVE NEGATIVE mg/dL   Hgb urine dipstick SMALL (A) NEGATIVE   Bilirubin Urine NEGATIVE NEGATIVE   Ketones, ur NEGATIVE NEGATIVE mg/dL   Protein, ur 30 (A) NEGATIVE mg/dL   Nitrite POSITIVE (A) NEGATIVE   Leukocytes,Ua LARGE (A) NEGATIVE   RBC / HPF 21-50 0 - 5 RBC/hpf   WBC, UA >50 (H) 0 - 5 WBC/hpf   Bacteria, UA MANY (A) NONE SEEN   Squamous Epithelial / LPF 0-5 0 - 5   WBC Clumps PRESENT    Mucus PRESENT     Comment: Performed at [facility], [place] [address], [place], [place] [address]  Urinalysis, Routine w reflex microscopic Urine, Clean Catch     Status: Abnormal   Collection Time: 07/03/20  3:00 AM  Result Value Ref Range   Color, Urine YELLOW YELLOW   APPearance CLEAR CLEAR   Specific Gravity, Urine 1.008 1.005 - 1.030   pH 6.0 5.0 - 8.0   Glucose, UA NEGATIVE NEGATIVE mg/dL   Hgb urine dipstick SMALL (A) NEGATIVE   Bilirubin Urine NEGATIVE NEGATIVE   Ketones, ur NEGATIVE NEGATIVE mg/dL   Protein, ur NEGATIVE NEGATIVE mg/dL   Nitrite POSITIVE (A) NEGATIVE   Leukocytes,Ua LARGE (A) NEGATIVE   RBC / HPF 0-5 0 - 5 RBC/hpf   WBC, UA >50 (H) 0 - 5 WBC/hpf   Bacteria, UA RARE (A) NONE SEEN   Mucus PRESENT     Comment: Performed at [facility], [place] [address], [place], [place] [address]  CBC     Status: Abnormal   Collection Time: 07/03/20  3:41 PM  Result Value Ref Range   WBC 12.6 (H) 4.0 - 10.5 K/uL   RBC 3.32 (L) 4.22 - 5.81 MIL/uL   Hemoglobin 9.7 (L) 13.0 - 17.0 g/dL   HCT 30.5 (L) 39 - 52 %   MCV 91.9 80.0 - 100.0 fL   MCH 29.2 26.0 - 34.0 pg   MCHC 31.8 30.0 - 36.0 g/dL   RDW 14.6 11.5 - 15.5 %   Platelets 286 150 - 400 K/uL   nRBC 0.0 0.0 - 0.2 %    Comment: Performed at [facility], [place] [address] [place],  [address]  Comprehensive metabolic panel     Status: Abnormal   Collection Time: 07/03/20  3:41 PM  Result Value Ref Range   Sodium 134 (L) 135 - 145  mmol/L   Potassium 4.0 3.5 - 5.1 mmol/L   Chloride 100 98 - 111 mmol/L   CO2 28 22 - 32 mmol/L   Glucose, Bld 113 (H) 70 - 99 mg/dL    Comment: Glucose reference range applies only to samples taken after fasting for at least 8 hours.   BUN 9 8 - 23 mg/dL   Creatinine, Ser 0.82 0.61 - 1.24 mg/dL   Calcium 8.9 8.9 - 10.3 mg/dL   Total Protein 5.7 (L) 6.5 - 8.1 g/dL   Albumin 3.2 (L) 3.5 - 5.0 g/dL   AST 20 15 - 41 U/L   ALT 21 0 - 44 U/L   Alkaline Phosphatase 50 38 - 126 U/L   Total Bilirubin 0.6 0.3 - 1.2 mg/dL   GFR calc non Af Amer >60 >60 mL/min   GFR calc Af Amer >60 >60 mL/min   Anion gap 6 5 - 15    Comment: Performed at [facility], [place] [address], [place], [place] [address]    Medications:  Current Facility-Administered Medications  Medication Dose Route Frequency Provider Last Rate Last Admin  . 0.9 % NaCl with KCl 20 mEq/ L  infusion   Intravenous Continuous Debbe Odea, MD 50 mL/hr at 07/03/20 1623 New Bag at 07/03/20 1623  . acetaminophen (TYLENOL) tablet 650 mg  650 mg Oral Q6H PRN Bonnielee Haff, MD   650 mg at 07/03/20 1104   Or  . acetaminophen (TYLENOL) suppository 650 mg  650 mg Rectal Q6H PRN Bonnielee Haff, MD      . alum & mag hydroxide-simeth (MAALOX/MYLANTA) 200-200-20 MG/5ML suspension 30 mL  30 mL Oral Q4H PRN Bonnielee Haff, MD   30 mL at 07/02/20 2130  . amLODipine (NORVASC) tablet 5 mg  5 mg Oral Daily Bonnielee Haff, MD   5 mg at 07/03/20 3149  . bisacodyl (DULCOLAX) EC tablet 10 mg  10 mg Oral Daily Georgette Shell, MD   10 mg at 07/02/20 1629  . cloNIDine (CATAPRES) tablet 0.1 mg  0.1 mg Oral BID Debbe Odea, MD   0.1 mg at 07/03/20 0808  . diclofenac Sodium (VOLTAREN) 1 % topical gel 4 g  4 g Topical QID Debbe Odea, MD   4 g at 07/03/20 1109  . DULoxetine (CYMBALTA) DR capsule 40 mg  40 mg Oral TID Debbe Odea, MD   40 mg at 07/03/20 1637  . gabapentin (NEURONTIN) capsule 800 mg  800 mg Oral TID Debbe Odea, MD    800 mg at 07/03/20 1637  . heparin injection 5,000 Units  5,000 Units Subcutaneous Q8H Bonnielee Haff, MD   5,000 Units at 07/03/20 1637  . hydrALAZINE (APRESOLINE) injection 10 mg  10 mg Intravenous Q6H PRN Bonnielee Haff, MD   10 mg at 06/30/20 0556  . hydroxychloroquine (PLAQUENIL) tablet 200 mg  200 mg Oral BID Debbe Odea, MD   200 mg at 07/03/20 1108  . ipratropium-albuterol (DUONEB) 0.5-2.5 (3) MG/3ML nebulizer solution 3 mL  3 mL Nebulization Q4H PRN Bonnielee Haff, MD      . LORazepam (ATIVAN) tablet 0.5 mg  0.5 mg Oral TID PRN Bonnielee Haff, MD   0.5 mg at 07/03/20 1104  . methocarbamol (ROBAXIN) tablet 750 mg  750 mg Oral QID Debbe Odea, MD   750 mg at 07/03/20 1636  . ondansetron (ZOFRAN) tablet 4 mg  4 mg Oral Q6H PRN Bonnielee Haff, MD   4 mg at 07/01/20 7026   Or  .  ondansetron (ZOFRAN) injection 4 mg  4 mg Intravenous Q6H PRN Bonnielee Haff, MD   4 mg at 07/03/20 1104  . oxyCODONE (Oxy IR/ROXICODONE) immediate release tablet 10 mg  10 mg Oral QID Debbe Odea, MD   10 mg at 07/03/20 1637  . pantoprazole (PROTONIX) EC tablet 40 mg  40 mg Oral Daily Debbe Odea, MD   40 mg at 07/03/20 0809  . rosuvastatin (CRESTOR) tablet 40 mg  40 mg Oral QPM Bonnielee Haff, MD   40 mg at 07/02/20 1827  . sodium chloride flush (NS) 0.9 % injection 3 mL  3 mL Intravenous Once Bonnielee Haff, MD      . sorbitol, milk of mag, mineral oil, glycerin (SMOG) enema  960 mL Rectal Once Georgette Shell, MD      . tamsulosin [facility]) capsule 0.4 mg  0.4 mg Oral Daily Bonnielee Haff, MD   0.4 mg at 07/03/20 0809    Musculoskeletal: Strength & Muscle Tone: decreased Gait & Station: unable to stand Patient leans: N/A  Psychiatric Specialty Exam: Physical Exam Vitals and nursing note reviewed.  Constitutional:      Appearance: He is well-developed.  HENT:     Head: Normocephalic.  Cardiovascular:     Rate and Rhythm: Normal rate.  Pulmonary:     Effort: Pulmonary effort is  normal.  Neurological:     Mental Status: He is alert and oriented to person, place, and time.  Psychiatric:        Mood and Affect: Mood normal.        Behavior: Behavior normal.        Thought Content: Thought content normal.        Judgment: Judgment normal.     Review of Systems  Constitutional: Negative.   HENT: Negative.   Eyes: Negative.   Respiratory: Negative.   Cardiovascular: Negative.   Gastrointestinal: Negative.   Genitourinary: Negative.   Musculoskeletal: Negative.   Skin: Negative.   Neurological: Negative.   Psychiatric/Behavioral: Negative.     Blood pressure (!) 147/81, pulse 79, temperature 98.5 F (36.9 C), resp. rate 16, height 5\' 10"  (1.778 m), weight 113.4 kg, SpO2 97 %.Body mass index is 35.87 kg/m.  General Appearance: Casual  Eye Contact:  Fair  Speech:  Clear and Coherent and Normal Rate  Volume:  Normal  Mood:  Euthymic  Affect:  Appropriate and Congruent  Thought Process:  Coherent, Goal Directed and Descriptions of Associations: Intact  Orientation:  Full (Time, Place, and Person)  Thought Content:  WDL and Logical  Suicidal Thoughts:  No  Homicidal Thoughts:  No  Memory:  Immediate;   Good Recent;   Good Remote;   Good  Judgement:  Fair  Insight:  Fair  Psychomotor Activity:  Normal  Concentration:  Concentration: Good and Attention Span: Good  Recall:  Good  Fund of Knowledge:  Good  Language:  Good  Akathisia:  No  Handed:  Right  AIMS (if indicated):     Assets:  Communication Skills Desire for Improvement Financial Resources/Insurance Housing Intimacy Leisure Time Resilience Social Support  ADL's:  Impaired  Cognition:  WNL  Sleep:        Treatment Plan Summary: Patient reviewed with Dr. Hampton Abbot.  Patient is a 79 year old male with history of depressive disorder.  Patient is alert and oriented, pleasant and cooperative during assessment.  Patient denies depressive symptoms, denies mood swings, delusions or  psychosis.  Based on my evaluation today, patient would benefit  from follow-up with outpatient talk therapy and counseling.  Recommendations: Refer to outpatient psychiatrist and therapist upon discharge for counseling and evaluation for medication management.    Disposition: No evidence of imminent risk to self or others at present.   Patient does not meet criteria for psychiatric inpatient admission. Supportive therapy provided about ongoing stressors.  This service was provided via telemedicine using a 2-way, interactive audio and video technology.  Names of all persons participating in this telemedicine service and their role in this encounter. Name: Jorge Sherman Role: Patient  Name: Letitia Libra Role: FNP  Name: Dr. Dwyane Dee Role: [place], FNP 07/03/2020 5:17 PM

## 2020-07-04 DIAGNOSIS — N179 Acute kidney failure, unspecified: Secondary | ICD-10-CM | POA: Diagnosis not present

## 2020-07-04 MED ORDER — CIPROFLOXACIN HCL 500 MG PO TABS: 500.0000 mg | ORAL_TABLET | Freq: Two times a day (BID) | ORAL | 0 refills | Status: DC

## 2020-07-04 MED ORDER — ONDANSETRON HCL 4 MG PO TABS: 4.0000 mg | ORAL_TABLET | Freq: Four times a day (QID) | ORAL | 0 refills | Status: AC | PRN

## 2020-07-04 MED ORDER — SACCHAROMYCES BOULARDII 250 MG PO CAPS: 250.0000 mg | ORAL_CAPSULE | Freq: Two times a day (BID) | ORAL | 2 refills | Status: DC

## 2020-07-04 MED ORDER — ACETAMINOPHEN 325 MG PO TABS: 650.0000 mg | ORAL_TABLET | Freq: Four times a day (QID) | ORAL | Status: DC | PRN

## 2020-07-04 MED ORDER — LOPERAMIDE HCL 2 MG PO TABS
2.0000 mg | ORAL_TABLET | Freq: Four times a day (QID) | ORAL | 0 refills | Status: DC | PRN
Start: 2020-07-04 — End: 2024-08-09

## 2020-07-04 MED ORDER — SACCHAROMYCES BOULARDII 250 MG PO CAPS
250.0000 mg | ORAL_CAPSULE | Freq: Two times a day (BID) | ORAL | Status: DC
  Administered 2020-07-04 – 2020-07-06 (×5): 250 mg via ORAL
  Filled 2020-07-04 (×5): qty 1

## 2020-07-04 MED ORDER — LOPERAMIDE HCL 2 MG PO CAPS
4.0000 mg | ORAL_CAPSULE | Freq: Once | ORAL | Status: AC
  Administered 2020-07-04: 4 mg via ORAL
  Filled 2020-07-04: qty 2

## 2020-07-04 MED ORDER — CIPROFLOXACIN HCL 500 MG PO TABS
500.0000 mg | ORAL_TABLET | Freq: Two times a day (BID) | ORAL | Status: DC
  Administered 2020-07-04 – 2020-07-06 (×5): 500 mg via ORAL
  Filled 2020-07-04 (×5): qty 1

## 2020-07-04 NOTE — Progress Notes (Signed)
CSW received handoff from 1st shift weekend RN CM pt is ready for D/C.  Per RN CM pt's last COVID result was 8/12 and pt's family states they want pt to go to [facility] which has not extended a bed offer nor has a referral been sent due to pt's family not making their wishes for [facility] to be known until today.  Per RN CM, provider states pt is stable for D/C.  CSW will continue to follow for D/C needs.  Jorge Sherman. Jorge Sherman  MSW, LCSW, LCAS, CCS Transitions of Care Clinical Social Worker Care Coordination Department Ph: [phone]

## 2020-07-04 NOTE — Discharge Summary (Addendum)
Physician Discharge Summary  Jorge Sherman LFY:[id number] DOB: 09-14-1941 DOA: 06/27/2020  PCP: Merrilee Seashore, MD  Admit date: 06/27/2020 Discharge date: 07/06/20 Admitted From: Home Disposition: Skilled nursing facility Recommendations for Outpatient Follow-up:  1. Follow up with PCP in 1-2 weeks 2. Please obtain BMP/CBC in one week 3. Please follow up with psychologist/psychiatrist for depression  Home Health: None Equipment/Devices: None  Discharge Condition: Stable CODE STATUS full code Diet recommendation: Cardiac diet Brief/Interim Summary: 79 year old male with essential hypertension, chronic back pain related to spinal stenosis, rheumatoid arthritis, OSA noncompliant with CPAP on numerous medications who lives alone and has private caretakers and presents to the hospital  for nausea vomiting and diarrhea for a number of days. In the ED>stated that he felt dehydrated and complained of ongoing chronic back pain skin for his Toradol. He had episode of diarrhea in the ED. Tachycardic with a temperature of 100.6 rectally. Creatinine was noted to be elevated from his baseline and was 1.87 with a BUN of 40, sodium 133, potassium 3.4 and chloride 97. White blood cell count was 27.9 and platelets were 513. The patient was started on IV fluids for dehydration. Stool studies were ordered.  Discharge Diagnoses:  Principal Problem:   AKI (acute kidney injury) ([place]) Active Problems:   Chronic pain   Essential hypertension, benign   Acute diarrhea   Ileus ([place])   Declining functional status   Goals of care, counseling/discussion   Palliative care by specialist     #1  AKI-resolved secondary to nausea vomiting diarrhea resolved creatinine back to normal.    #2  Colonic ileus -resolved.minimize narcotics.he tolerated a diet prior to dc.   #3 history of essential hypertension on Norvasc and Catapres  #4 chronic pain Neurontin 800 mg 3 times a day Cymbalta 40 mg 3 times  a day Toradol 10 mg q. 8 Robaxin-750 milligram 4 times a day Ativan 0.5 --3 times a day as needed Oxycodone 10 mg 4 times daily Try to taper to DC some of those above medications.  #5 hyperlipidemia on Crestor  #6 BPH on Flomax  #7 depression on cymbalta  #8 leukocytosis significantly improved. Urine culture citrobacter  Sensitive to cipro. Continue cipro on dc.  #9 depression he was seen by psych because of concern for depression.  Their recommendation was to follow-up with outpatient psychiatrist and therapist.  Daughter has already made this appointment.  #10 diarrhea patient was admitted with diarrhea and leukocytosis.  Stool C. difficile was negative.  GI panel was negative.  Imodium as needed ordered on discharge.     Estimated body mass index is 35.87 kg/m as calculated from the following:   Height as of this encounter: 5' 10"  (1.778 m).   Weight as of this encounter: 113.4 kg.  Discharge Instructions  Discharge Instructions    Diet - low sodium heart healthy   Complete by: As directed    Increase activity slowly   Complete by: As directed    Urine Culture   Complete by: As directed      Allergies as of 07/04/2020      Reactions   Cefepime Rash   Iohexol Hives, Other (See Comments)   A few hives post CT   Fentanyl Itching, Nausea And Vomiting, Other (See Comments)   Patient states that it happened over 30 yrs ago   Iodine Rash      Medication List    TAKE these medications   acetaminophen 325 MG tablet Commonly known as: TYLENOL  Take 2 tablets (650 mg total) by mouth every 6 (six) hours as needed for mild pain (or Fever >/= 101).   amLODipine 2.5 MG tablet Commonly known as: NORVASC Take 2.5 mg by mouth daily.   celecoxib 200 MG capsule Commonly known as: CELEBREX Take 200 mg by mouth 2 (two) times daily.   ciprofloxacin 500 MG tablet Commonly known as: CIPRO Take 1 tablet (500 mg total) by mouth 2 (two) times daily.   cloNIDine 0.1 MG  tablet Commonly known as: CATAPRES Take 0.1 mg by mouth 2 (two) times daily.   DULoxetine 30 MG capsule Commonly known as: CYMBALTA Take 30 mg by mouth 3 (three) times daily.   ferrous sulfate 325 (65 FE) MG tablet Take 325 mg by mouth daily.   furosemide 20 MG tablet Commonly known as: LASIX Take 20 mg by mouth daily as needed.   FUSION PLUS PO Take 1 capsule by mouth daily.   gabapentin 800 MG tablet Commonly known as: NEURONTIN Take 800 mg by mouth 3 (three) times daily.   hydroxychloroquine 200 MG tablet Commonly known as: PLAQUENIL Take 200 mg by mouth 2 (two) times daily.   ipratropium-albuterol 0.5-2.5 (3) MG/3ML Soln Commonly known as: DUONEB SMARTSIG:1 Vial(s) Via Nebulizer PRN   ketorolac 10 MG tablet Commonly known as: TORADOL Take 10 mg by mouth every 6 (six) hours as needed.   Lidocaine-Menthol 3.6-1.25 % Ptch Place onto the skin.   Linzess 72 MCG capsule Generic drug: linaclotide Take 72 mcg by mouth daily before breakfast.   LORazepam 1 MG tablet Commonly known as: ATIVAN Take 2 mg by mouth every 6 (six) hours as needed for anxiety.   methocarbamol 500 MG tablet Commonly known as: ROBAXIN Take 1,000 mg by mouth 3 (three) times daily.   Narcan 4 MG/0.1ML Liqd nasal spray kit Generic drug: naloxone SMARTSIG:1 Spray(s) Both Nares   omeprazole 40 MG capsule Commonly known as: PRILOSEC Take 40 mg by mouth daily.   ondansetron 4 MG tablet Commonly known as: ZOFRAN Take 1 tablet (4 mg total) by mouth every 6 (six) hours as needed for nausea.   Oxycodone HCl 20 MG Tabs Take 1 tablet by mouth every 6 (six) hours as needed. What changed: Another medication with the same name was removed. Continue taking this medication, and follow the directions you see here.   rosuvastatin 40 MG tablet Commonly known as: CRESTOR Take 40 mg by mouth every evening.   saccharomyces boulardii 250 MG capsule Commonly known as: FLORASTOR Take 1 capsule (250 mg  total) by mouth 2 (two) times daily.   tamsulosin 0.4 MG Caps capsule Commonly known as: FLOMAX Take 0.4 mg by mouth daily.       Allergies  Allergen Reactions  . Cefepime Rash  . Iohexol Hives and Other (See Comments)    A few hives post CT  . Fentanyl Itching, Nausea And Vomiting and Other (See Comments)    Patient states that it happened over 30 yrs ago  . Iodine Rash    Consultations:  None   Procedures/Studies: CT ABDOMEN PELVIS WO CONTRAST  Result Date: 06/29/2020 CLINICAL DATA:  Diarrhea, abdominal distension, leukocytosis EXAM: CT ABDOMEN AND PELVIS WITHOUT CONTRAST TECHNIQUE: Multidetector CT imaging of the abdomen and pelvis was performed following the standard protocol without IV contrast. COMPARISON:  08/31/2019 FINDINGS: Lower chest: The visualized lung bases are clear. There is extensive coronary artery calcification noted within the visualized heart. Cardiac size within normal limits. No pericardial effusion. Hepatobiliary: Cholelithiasis  without superimposed pericholecystic inflammatory change. Liver unremarkable. No intra or extrahepatic biliary ductal dilation. 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 The adrenal glands are otherwise unremarkable. The kidneys are normal in size and position. Exophytic simple cortical cyst arises from the interpolar region of the right kidney posteriorly. Tiny exophytic hyperdense lesions are too small to characterize on this examination. No hydronephrosis. No intrarenal or ureteral calculi. The bladder is unremarkable. Stomach/Bowel: Stomach unremarkable. The small bowel is unremarkable. There is mild gaseous distension of the a proximal colon without evidence of obstruction or focal inflammation. Appendix absent. No free intraperitoneal gas or fluid. Vascular/Lymphatic: Moderate aortoiliac atherosclerotic calcification without evidence of aneurysm. Reproductive:  Prostate gland and seminal vesicles are unremarkable. Other: Fluid within the rectal vault may present clinically as diarrhea. No abdominal wall hernia Musculoskeletal: L1 vertebroplasty has been performed. Mild loss of height noted. L2-S1 posterior lumbar fusion with instrumentation has been performed. Interbody bone cages are noted at L2-3 and L3-4. Resection of the spinous processes of L2, L3, and L4 are identified. Heterotopic ossification noted posterior to L5. No acute bone abnormality. IMPRESSION: No radiographic explanation for the patient's reported symptoms. Incidental findings as noted above. Aortic Atherosclerosis (ICD10-I70.0). Electronically Signed   By: Fidela Salisbury MD   On: 06/29/2020 18:41   DG Abd 1 View  Result Date: 07/03/2020 CLINICAL DATA:  Distended abdomen. EXAM: ABDOMEN - 1 VIEW COMPARISON:  None. FINDINGS: Diffuse colonic distension is similar the prior exam. No free air is present. IMPRESSION: Similar appearance of diffuse colonic distension. Electronically Signed   By: San Morelle M.D.   On: 07/03/2020 08:00   DG Abd 1 View  Result Date: 07/01/2020 CLINICAL DATA:  Distended abdomen EXAM: ABDOMEN - 1 VIEW COMPARISON:  06/28/2020 FINDINGS: Mild improvement in diffuse colonic distension. Small bowel nondilated. Multilevel lumbar fusion with hardware. Vertebroplasty cement at L1. IMPRESSION: Diffuse colonic distension with interval improvement. Probable ileus. Electronically Signed   By: Franchot Gallo M.D.   On: 07/01/2020 14:22   DG Chest Portable 1 View  Result Date: 06/27/2020 CLINICAL DATA:  Weakness. Arrives by EMS from home. Nausea, vomiting, diarrhea for 2-3 days. EXAM: PORTABLE CHEST 1 VIEW COMPARISON:  06/25/2020 FINDINGS: The heart size and mediastinal contours are within normal limits. Both lungs are clear. Chronic changes in both shoulders and AC joints. IMPRESSION: No active disease. Electronically Signed   By: Nolon Nations M.D.   On: 06/27/2020 11:24    DG Chest Portable 1 View  Result Date: 06/25/2020 CLINICAL DATA:  Chest pain, diarrhea for 2 weeks EXAM: PORTABLE CHEST 1 VIEW COMPARISON:  Radiograph 08/31/2019 FINDINGS: Low lung volumes with chronic mild elevation of the right hemidiaphragm. No consolidation, features of edema, pneumothorax, or effusion. Stable cardiomediastinal contours. No acute osseous or soft tissue abnormality. Degenerative changes are present in the imaged spine and shoulders. Features are quite severe in the left glenohumeral joint, similar to comparison. IMPRESSION: 1. Low lung volumes with chronic mild elevation of the right hemidiaphragm. 2. No acute cardiopulmonary findings. Electronically Signed   By: Lovena Le M.D.   On: 06/25/2020 14:55   DG Abd Portable 2V  Result Date: 06/28/2020 CLINICAL DATA:  Generalized abdominal pain and distention EXAM: PORTABLE ABDOMEN - 2 VIEW COMPARISON:  08/05/2019 CT abdomen/pelvis FINDINGS: Diffuse prominent gaseous distention of the colon. No disproportionately dilated small bowel loops. No evidence of pneumatosis or pneumoperitoneum. Bilateral posterior spinal fusion hardware in the lower lumbar spine with post vertebroplasty  change at L1. IMPRESSION: Diffuse prominent gaseous distention of the colon, favor colonic ileus. Electronically Signed   By: Ilona Sorrel M.D.   On: 06/28/2020 12:15    (Echo, Carotid, EGD, Colonoscopy, ERCP)    Subjective: Patient is resting in bed overnight staff reported that he slept well denied any nausea vomiting or diarrhea However patient tells me that he had 1 loose BM yesterday.  Discharge Exam: Vitals:   07/04/20 0436 07/04/20 0942  BP: (!) 162/93 (!) 143/71  Pulse: 89 87  Resp: 16   Temp: 97.9 F (36.6 C)   SpO2: 100%    Vitals:   07/03/20 0353 07/03/20 2237 07/04/20 0436 07/04/20 0942  BP: (!) 147/81 (!) 148/89 (!) 162/93 (!) 143/71  Pulse: 79 96 89 87  Resp: 16 16 16    Temp: 98.5 F (36.9 C) 98.2 F (36.8 C) 97.9 F (36.6 C)    TempSrc:  Oral Oral   SpO2: 97% 97% 100%   Weight:      Height:        General: Pt is alert, awake, not in acute distress Cardiovascular: RRR, S1/S2 +, no rubs, no gallops Respiratory: CTA bilaterally, no wheezing, no rhonchi Abdominal: Soft, NT, ND, bowel sounds + Extremities: no edema, no cyanosis    The results of significant diagnostics from this hospitalization (including imaging, microbiology, ancillary and laboratory) are listed below for reference.     Microbiology: Recent Results (from the past 240 hour(s))  Culture, blood (routine x 2)     Status: None   Collection Time: 06/27/20 10:27 AM   Specimen: BLOOD  Result Value Ref Range Status   Specimen Description   Final    BLOOD RIGHT ANTECUBITAL Performed at [facility], [place] [address] [place], [place] [address]    Special Requests   Final    BOTTLES DRAWN AEROBIC AND ANAEROBIC Blood Culture adequate volume Performed at [place] [address] [place], [place] [address]    Culture   Final    NO GROWTH 5 DAYS Performed at [facility], [place] [address] [place], [place] [address]    Report Status 07/02/2020 FINAL  Final  SARS Coronavirus 2 by RT PCR (hospital order, performed in [facility] hospital lab) Nasopharyngeal Nasopharyngeal Swab     Status: None   Collection Time: 06/27/20  1:39 PM   Specimen: Nasopharyngeal Swab  Result Value Ref Range Status   SARS Coronavirus 2 NEGATIVE NEGATIVE Final    Comment: (NOTE) SARS-CoV-2 target nucleic acids are NOT DETECTED.  The SARS-CoV-2 RNA is generally detectable in upper and lower respiratory specimens during the acute phase of infection. The lowest concentration of SARS-CoV-2 viral copies this assay can detect is 250 copies / mL. A negative result does not preclude SARS-CoV-2 infection and should not be used as the sole basis for treatment or other patient management decisions.  A negative result may occur with improper  specimen collection / handling, submission of specimen other than nasopharyngeal swab, presence of viral mutation(s) within the areas targeted by this assay, and inadequate number of viral copies (<250 copies / mL). A negative result must be combined with clinical observations, patient history, and epidemiological information.  Fact Sheet for Patients:   StrictlyIdeas.no  Fact Sheet for Healthcare Providers: BankingDealers.co.za  This test is not yet approved or  cleared by the [place] FDA and has been authorized for detection and/or diagnosis of SARS-CoV-2 by FDA under an Emergency Use Authorization (  EUA).  This EUA will remain in effect (meaning this test can be used) for the duration of the COVID-19 declaration under Section 564(b)(1) of the Act, 21 U.S.C. section 360bbb-3(b)(1), unless the authorization is terminated or revoked sooner.  Performed at [facility], [place] [address] [place], [place] [address]   C Difficile Quick Screen w PCR reflex     Status: None   Collection Time: 06/29/20  9:30 PM   Specimen: STOOL  Result Value Ref Range Status   C Diff antigen NEGATIVE NEGATIVE Final   C Diff toxin NEGATIVE NEGATIVE Final   C Diff interpretation No C. difficile detected.  Final    Comment: Performed at [facility], [place] [address] [place], [place] [address]  Gastrointestinal Panel by PCR , Stool     Status: None   Collection Time: 06/29/20  9:30 PM   Specimen: STOOL  Result Value Ref Range Status   Campylobacter species NOT DETECTED NOT DETECTED Final   Plesimonas shigelloides NOT DETECTED NOT DETECTED Final   Salmonella species NOT DETECTED NOT DETECTED Final   Yersinia enterocolitica NOT DETECTED NOT DETECTED Final   Vibrio species NOT DETECTED NOT DETECTED Final   Vibrio cholerae NOT DETECTED NOT DETECTED Final   Enteroaggregative E coli (EAEC) NOT DETECTED NOT DETECTED Final    Enteropathogenic E coli (EPEC) NOT DETECTED NOT DETECTED Final   Enterotoxigenic E coli (ETEC) NOT DETECTED NOT DETECTED Final   Shiga like toxin producing E coli (STEC) NOT DETECTED NOT DETECTED Final   Shigella/Enteroinvasive E coli (EIEC) NOT DETECTED NOT DETECTED Final   Cryptosporidium NOT DETECTED NOT DETECTED Final   Cyclospora cayetanensis NOT DETECTED NOT DETECTED Final   Entamoeba histolytica NOT DETECTED NOT DETECTED Final   Giardia lamblia NOT DETECTED NOT DETECTED Final   Adenovirus F40/41 NOT DETECTED NOT DETECTED Final   Astrovirus NOT DETECTED NOT DETECTED Final   Norovirus GI/GII NOT DETECTED NOT DETECTED Final   Rotavirus A NOT DETECTED NOT DETECTED Final   Sapovirus (I, II, IV, and V) NOT DETECTED NOT DETECTED Final    Comment: Performed at [facility], [place] [place], [place] [address]  SARS Coronavirus 2 by RT PCR (hospital order, performed in [facility] hospital lab) Nasopharyngeal Nasopharyngeal Swab     Status: None   Collection Time: 07/02/20  4:41 PM   Specimen: Nasopharyngeal Swab  Result Value Ref Range Status   SARS Coronavirus 2 NEGATIVE NEGATIVE Final    Comment: (NOTE) SARS-CoV-2 target nucleic acids are NOT DETECTED.  The SARS-CoV-2 RNA is generally detectable in upper and lower respiratory specimens during the acute phase of infection. The lowest concentration of SARS-CoV-2 viral copies this assay can detect is 250 copies / mL. A negative result does not preclude SARS-CoV-2 infection and should not be used as the sole basis for treatment or other patient management decisions.  A negative result may occur with improper specimen collection / handling, submission of specimen other than nasopharyngeal swab, presence of viral mutation(s) within the areas targeted by this assay, and inadequate number of viral copies (<250 copies / mL). A negative result must be combined with clinical observations, patient history, and epidemiological  information.  Fact Sheet for Patients:   StrictlyIdeas.no  Fact Sheet for Healthcare Providers: BankingDealers.co.za  This test is not yet approved or  cleared by the [place] FDA and has been authorized for detection and/or diagnosis of SARS-CoV-2 by FDA under an Emergency Use Authorization (EUA).  This  EUA will remain in effect (meaning this test can be used) for the duration of the COVID-19 declaration under Section 564(b)(1) of the Act, 21 U.S.C. section 360bbb-3(b)(1), unless the authorization is terminated or revoked sooner.  Performed at [facility], [place] [address] [place], [place] [address]      Labs: BNP (last 3 results) No results for input(s): BNP in the last 8760 hours. Basic Metabolic Panel: Recent Labs  Lab 06/28/20 0757 06/28/20 1357 06/30/20 1507 07/01/20 0232 07/03/20 1541  NA 127* 135 132* 130* 134*  K 3.6 4.1 4.1 3.9 4.0  CL 97* 104 102 101 100  CO2 21* 24 23 24 28   GLUCOSE 140* 144* 135* 122* 113*  BUN 28* 30* 12 12 9   CREATININE 1.14 1.03 0.61 0.83 0.82  CALCIUM 8.3* 8.6* 8.4* 8.3* 8.9  MG 2.5*  --   --   --   --    Liver Function Tests: Recent Labs  Lab 06/28/20 0757 07/03/20 1541  AST 21 20  ALT 17 21  ALKPHOS 52 50  BILITOT 0.4 0.6  PROT 6.6 5.7*  ALBUMIN 3.7 3.2*   No results for input(s): LIPASE, AMYLASE in the last 168 hours. No results for input(s): AMMONIA in the last 168 hours. CBC: Recent Labs  Lab 06/28/20 0757 06/30/20 1507 07/01/20 0232 07/03/20 1541  WBC 20.2* 12.8* 13.6* 12.6*  HGB 11.4* 9.8* 9.2* 9.7*  HCT 34.8* 29.2* 28.9* 30.5*  MCV 90.9 90.1 92.0 91.9  PLT 399 289 281 286   Cardiac Enzymes: No results for input(s): CKTOTAL, CKMB, CKMBINDEX, TROPONINI in the last 168 hours. BNP: Invalid input(s): POCBNP CBG: No results for input(s): GLUCAP in the last 168 hours. D-Dimer No results for input(s): DDIMER in the last 72 hours. Hgb  A1c No results for input(s): HGBA1C in the last 72 hours. Lipid Profile No results for input(s): CHOL, HDL, LDLCALC, TRIG, CHOLHDL, LDLDIRECT in the last 72 hours. Thyroid function studies No results for input(s): TSH, T4TOTAL, T3FREE, THYROIDAB in the last 72 hours.  Invalid input(s): FREET3 Anemia work up No results for input(s): VITAMINB12, FOLATE, FERRITIN, TIBC, IRON, RETICCTPCT in the last 72 hours. Urinalysis    Component Value Date/Time   COLORURINE YELLOW 07/03/2020 0300   APPEARANCEUR CLEAR 07/03/2020 0300   LABSPEC 1.008 07/03/2020 0300   PHURINE 6.0 07/03/2020 0300   GLUCOSEU NEGATIVE 07/03/2020 0300   HGBUR SMALL (A) 07/03/2020 0300   BILIRUBINUR NEGATIVE 07/03/2020 0300   KETONESUR NEGATIVE 07/03/2020 0300   PROTEINUR NEGATIVE 07/03/2020 0300   NITRITE POSITIVE (A) 07/03/2020 0300   LEUKOCYTESUR LARGE (A) 07/03/2020 0300   Sepsis Labs Invalid input(s): PROCALCITONIN,  WBC,  LACTICIDVEN Microbiology Recent Results (from the past 240 hour(s))  Culture, blood (routine x 2)     Status: None   Collection Time: 06/27/20 10:27 AM   Specimen: BLOOD  Result Value Ref Range Status   Specimen Description   Final    BLOOD RIGHT ANTECUBITAL Performed at [facility], [place] [address], [place], [place] [address]    Special Requests   Final    BOTTLES DRAWN AEROBIC AND ANAEROBIC Blood Culture adequate volume Performed at [place] [address] [place],  [address]    Culture   Final    NO GROWTH 5 DAYS Performed at [facility], [place] [address], [place],  [address]    Report Status 07/02/2020 FINAL  Final  SARS Coronavirus 2 by RT PCR (hospital order,  performed in [facility] hospital lab) Nasopharyngeal Nasopharyngeal Swab     Status: None   Collection Time: 06/27/20  1:39 PM   Specimen: Nasopharyngeal Swab  Result Value Ref Range Status   SARS Coronavirus 2 NEGATIVE NEGATIVE Final    Comment: (NOTE) SARS-CoV-2 target  nucleic acids are NOT DETECTED.  The SARS-CoV-2 RNA is generally detectable in upper and lower respiratory specimens during the acute phase of infection. The lowest concentration of SARS-CoV-2 viral copies this assay can detect is 250 copies / mL. A negative result does not preclude SARS-CoV-2 infection and should not be used as the sole basis for treatment or other patient management decisions.  A negative result may occur with improper specimen collection / handling, submission of specimen other than nasopharyngeal swab, presence of viral mutation(s) within the areas targeted by this assay, and inadequate number of viral copies (<250 copies / mL). A negative result must be combined with clinical observations, patient history, and epidemiological information.  Fact Sheet for Patients:   StrictlyIdeas.no  Fact Sheet for Healthcare Providers: BankingDealers.co.za  This test is not yet approved or  cleared by the [place] FDA and has been authorized for detection and/or diagnosis of SARS-CoV-2 by FDA under an Emergency Use Authorization (EUA).  This EUA will remain in effect (meaning this test can be used) for the duration of the COVID-19 declaration under Section 564(b)(1) of the Act, 21 U.S.C. section 360bbb-3(b)(1), unless the authorization is terminated or revoked sooner.  Performed at [facility], [place] [address], [place], [place] [address]   C Difficile Quick Screen w PCR reflex     Status: None   Collection Time: 06/29/20  9:30 PM   Specimen: STOOL  Result Value Ref Range Status   C Diff antigen NEGATIVE NEGATIVE Final   C Diff toxin NEGATIVE NEGATIVE Final   C Diff interpretation No C. difficile detected.  Final    Comment: Performed at [facility], [place] [address] [place], [place] [address]  Gastrointestinal Panel by PCR , Stool     Status: None   Collection Time: 06/29/20  9:30 PM    Specimen: STOOL  Result Value Ref Range Status   Campylobacter species NOT DETECTED NOT DETECTED Final   Plesimonas shigelloides NOT DETECTED NOT DETECTED Final   Salmonella species NOT DETECTED NOT DETECTED Final   Yersinia enterocolitica NOT DETECTED NOT DETECTED Final   Vibrio species NOT DETECTED NOT DETECTED Final   Vibrio cholerae NOT DETECTED NOT DETECTED Final   Enteroaggregative E coli (EAEC) NOT DETECTED NOT DETECTED Final   Enteropathogenic E coli (EPEC) NOT DETECTED NOT DETECTED Final   Enterotoxigenic E coli (ETEC) NOT DETECTED NOT DETECTED Final   Shiga like toxin producing E coli (STEC) NOT DETECTED NOT DETECTED Final   Shigella/Enteroinvasive E coli (EIEC) NOT DETECTED NOT DETECTED Final   Cryptosporidium NOT DETECTED NOT DETECTED Final   Cyclospora cayetanensis NOT DETECTED NOT DETECTED Final   Entamoeba histolytica NOT DETECTED NOT DETECTED Final   Giardia lamblia NOT DETECTED NOT DETECTED Final   Adenovirus F40/41 NOT DETECTED NOT DETECTED Final   Astrovirus NOT DETECTED NOT DETECTED Final   Norovirus GI/GII NOT DETECTED NOT DETECTED Final   Rotavirus A NOT DETECTED NOT DETECTED Final   Sapovirus (I, II, IV, and V) NOT DETECTED NOT DETECTED Final    Comment: Performed at [facility], [address] [place], [place] [address]  SARS Coronavirus 2 by RT PCR (hospital order, performed in [facility] hospital lab) Nasopharyngeal Nasopharyngeal Swab     Status: None   Collection Time: 07/02/20  4:41 PM   Specimen: Nasopharyngeal Swab  Result Value Ref Range Status   SARS Coronavirus 2 NEGATIVE NEGATIVE Final    Comment: (NOTE) SARS-CoV-2 target nucleic acids are NOT DETECTED.  The SARS-CoV-2 RNA is generally detectable in upper and lower respiratory specimens during the acute phase of infection. The lowest concentration of SARS-CoV-2 viral copies this assay can detect is 250 copies / mL. A negative result does not preclude SARS-CoV-2 infection and should  not be used as the sole basis for treatment or other patient management decisions.  A negative result may occur with improper specimen collection / handling, submission of specimen other than nasopharyngeal swab, presence of viral mutation(s) within the areas targeted by this assay, and inadequate number of viral copies (<250 copies / mL). A negative result must be combined with clinical observations, patient history, and epidemiological information.  Fact Sheet for Patients:   StrictlyIdeas.no  Fact Sheet for Healthcare Providers: BankingDealers.co.za  This test is not yet approved or  cleared by the [place] FDA and has been authorized for detection and/or diagnosis of SARS-CoV-2 by FDA under an Emergency Use Authorization (EUA).  This EUA will remain in effect (meaning this test can be used) for the duration of the COVID-19 declaration under Section 564(b)(1) of the Act, 21 U.S.C. section 360bbb-3(b)(1), unless the authorization is terminated or revoked sooner.  Performed at [facility], [place] [address], [place],  [address]      Time coordinating discharge:  39 minutes  SIGNED:   Georgette Shell, MD  [facility] 07/04/2020, 10:27 AM   If 7PM-7AM, please contact night-coverage [email or web address] Password [email or web address]

## 2020-07-04 NOTE — Progress Notes (Signed)
CSW spoke to pt who insisted he and his daughter want to only go to [facility] in [place] at ph: [phone] .  CSW asked to offer pt his choices available and pt declined and asked CSW to speak to his daughter.  Pt ready for D/C now, per provider.  CSW called pt's daughter Wymon Swaney at ph: [phone] to offer bed choices at pt's request and was unable to reach her X2.  Weekend CSW will leave handoff for 1st shift CSW.  CSW will continue to follow for D/C needs.  Jorge Sherman. Jorge Sherman  MSW, LCSW, LCAS, CCS Transitions of Care Clinical Social Worker Care Coordination Department Ph: [phone]

## 2020-07-04 NOTE — Progress Notes (Signed)
PROGRESS NOTE    Jorge Sherman  FGH:[id number] DOB: 1941-04-06 DOA: 06/27/2020 PCP: Merrilee Seashore, MD    Brief Narrative: 79 year old male with essential hypertension, chronic back pain related to spinal stenosis, rheumatoid arthritis, OSA noncompliant with CPAP on numerous medications who lives alone and has private caretakers and presents to the hospital EMS for nausea vomiting and diarrhea for a number of days. In the ED> stated that he felt dehydrated and complained of ongoing chronic back pain skin for his Toradol.  He had episode of diarrhea in the ED. Tachycardic with a temperature of 100.6 rectally. Creatinine was noted to be elevated from his baseline and was 1.87 with a BUN of 40, sodium 133, potassium 3.4 and chloride 97. White blood cell count was 27.9 and platelets were 513. The patient was started on IV fluids for dehydration.  Stool studies were ordered.  Assessment & Plan:   Principal Problem:   AKI (acute kidney injury) ([place]) Active Problems:   Chronic pain   Essential hypertension, benign   Acute diarrhea   Ileus ([place])   Declining functional status   Goals of care, counseling/discussion   Palliative care by specialist    #1  AKI-resolved on IV fluids secondary to vomiting and diarrhea and dehydration.  #2  Colonic ileus -with some improvement.  Patient reports his been having diarrhea he had one loose bowel movement yesterday.  He reports he is not eating and that he is only drinking liquids.  He continues to request IV narcotics for belly pain.  KUB from yesterday with persistent colonic ileus.  Overnight staff did not report any evidence of vomiting.  Continue Zofran.  UA with high WBCs and leukocyte esterase.  Culture pending.  #3 history of essential hypertension on Norvasc and Catapres  #4 chronic pain-I have stopped his Dilaudid.   Neurontin 800 mg 3 times a day Cymbalta 40 mg 3 times a day Toradol 10 mg q. 8 Robaxin-750 milligram 4 times a  day Ativan 0.5 --3 times a day as needed Oxycodone 10 mg 4 times daily In spite of all the above medications he still continues to scream with pain Unfortunately patient is mostly bedbound and wheelchair-bound  #5 hyperlipidemia on Crestor  #6 BPH on Flomax  #7 depression on cymbalta  #8 leukocytosis still persistent with no evidence of active infection noted.  UA appears dirty.  Positive for nitrites leukocyte Estrace and more than 50 WBCs.  Estimated body mass index is 35.87 kg/m as calculated from the following:   Height as of this encounter: 5\' 10"  (1.778 m).   Weight as of this encounter: 113.4 kg.  DVT prophylaxis: heparin Code Status: full Family Communication: none Disposition Plan:  Status is: Inpatient.colonic ileus on ivf  Dispo: The patient is from:home              Anticipated d/c is to:snf              Anticipated d/c date is: unknown              Patient currently is not medically stable to d/c.    Consultants: none  Procedures: none Antimicrobials: Anti-infectives (From admission, onward)   Start     Dose/Rate Route Frequency Ordered Stop   06/30/20 2200  hydroxychloroquine (PLAQUENIL) tablet 200 mg     Discontinue     200 mg Oral 2 times daily 06/30/20 1635        Subjective: Patient continues to scream with abdominal pain continues  to complain of nausea.  Had bowel movements.  Daughter reported he had multiple episodes of UTI in the past.   Objective: Vitals:   07/03/20 0353 07/03/20 2237 07/04/20 0436 07/04/20 0942  BP: (!) 147/81 (!) 148/89 (!) 162/93 (!) 143/71  Pulse: 79 96 89 87  Resp: 16 16 16    Temp: 98.5 F (36.9 C) 98.2 F (36.8 C) 97.9 F (36.6 C)   TempSrc:  Oral Oral   SpO2: 97% 97% 100%   Weight:      Height:        Intake/Output Summary (Last 24 hours) at 07/04/2020 1011 Last data filed at 07/04/2020 0436 Gross per 24 hour  Intake 2212.94 ml  Output 3050 ml  Net -837.06 ml   Filed Weights   06/26/20 1750  Weight: 113.4  kg    Examination:  General exam: Appears calm and comfortable  Respiratory system: Clear to auscultation. Respiratory effort normal. Cardiovascular system: S1 & S2 heard, RRR. No JVD, murmurs, rubs, gallops or clicks. No pedal edema. Gastrointestinal system: Abdomen is distended, soft and nontender. No organomegaly or masses felt. diminished bowel sounds heard. Central nervous system: Alert and oriented. No focal neurological deficits. Extremities: Symmetric 5 x 5 power. Skin: No rashes, lesions or ulcers Psychiatry: Judgement and insight appear normal. Mood & affect appropriate.     Data Reviewed: I have personally reviewed following labs and imaging studies  CBC: Recent Labs  Lab 06/28/20 0757 06/30/20 1507 07/01/20 0232 07/03/20 1541  WBC 20.2* 12.8* 13.6* 12.6*  HGB 11.4* 9.8* 9.2* 9.7*  HCT 34.8* 29.2* 28.9* 30.5*  MCV 90.9 90.1 92.0 91.9  PLT 399 289 281 539   Basic Metabolic Panel: Recent Labs  Lab 06/28/20 0757 06/28/20 1357 06/30/20 1507 07/01/20 0232 07/03/20 1541  NA 127* 135 132* 130* 134*  K 3.6 4.1 4.1 3.9 4.0  CL 97* 104 102 101 100  CO2 21* 24 23 24 28   GLUCOSE 140* 144* 135* 122* 113*  BUN 28* 30* 12 12 9   CREATININE 1.14 1.03 0.61 0.83 0.82  CALCIUM 8.3* 8.6* 8.4* 8.3* 8.9  MG 2.5*  --   --   --   --    GFR: Estimated Creatinine Clearance: 92.2 mL/min (by C-G formula based on SCr of 0.82 mg/dL). Liver Function Tests: Recent Labs  Lab 06/28/20 0757 07/03/20 1541  AST 21 20  ALT 17 21  ALKPHOS 52 50  BILITOT 0.4 0.6  PROT 6.6 5.7*  ALBUMIN 3.7 3.2*   No results for input(s): LIPASE, AMYLASE in the last 168 hours. No results for input(s): AMMONIA in the last 168 hours. Coagulation Profile: No results for input(s): INR, PROTIME in the last 168 hours. Cardiac Enzymes: No results for input(s): CKTOTAL, CKMB, CKMBINDEX, TROPONINI in the last 168 hours. BNP (last 3 results) No results for input(s): PROBNP in the last 8760  hours. HbA1C: No results for input(s): HGBA1C in the last 72 hours. CBG: No results for input(s): GLUCAP in the last 168 hours. Lipid Profile: No results for input(s): CHOL, HDL, LDLCALC, TRIG, CHOLHDL, LDLDIRECT in the last 72 hours. Thyroid Function Tests: No results for input(s): TSH, T4TOTAL, FREET4, T3FREE, THYROIDAB in the last 72 hours. Anemia Panel: No results for input(s): VITAMINB12, FOLATE, FERRITIN, TIBC, IRON, RETICCTPCT in the last 72 hours. Sepsis Labs: Recent Labs  Lab 06/27/20 1026 06/27/20 1211  LATICACIDVEN 2.0* 1.4    Recent Results (from the past 240 hour(s))  Culture, blood (routine x 2)  Status: None   Collection Time: 06/27/20 10:27 AM   Specimen: BLOOD  Result Value Ref Range Status   Specimen Description   Final    BLOOD RIGHT ANTECUBITAL Performed at [facility], [place] [address] [place], [place] [address]    Special Requests   Final    BOTTLES DRAWN AEROBIC AND ANAEROBIC Blood Culture adequate volume Performed at [place] [address], [place], [place] [address]    Culture   Final    NO GROWTH 5 DAYS Performed at [facility], [place] [address] [place], [place] [address]    Report Status 07/02/2020 FINAL  Final  SARS Coronavirus 2 by RT PCR (hospital order, performed in [facility] hospital lab) Nasopharyngeal Nasopharyngeal Swab     Status: None   Collection Time: 06/27/20  1:39 PM   Specimen: Nasopharyngeal Swab  Result Value Ref Range Status   SARS Coronavirus 2 NEGATIVE NEGATIVE Final    Comment: (NOTE) SARS-CoV-2 target nucleic acids are NOT DETECTED.  The SARS-CoV-2 RNA is generally detectable in upper and lower respiratory specimens during the acute phase of infection. The lowest concentration of SARS-CoV-2 viral copies this assay can detect is 250 copies / mL. A negative result does not preclude SARS-CoV-2 infection and should not be used as the sole basis for treatment or other patient  management decisions.  A negative result may occur with improper specimen collection / handling, submission of specimen other than nasopharyngeal swab, presence of viral mutation(s) within the areas targeted by this assay, and inadequate number of viral copies (<250 copies / mL). A negative result must be combined with clinical observations, patient history, and epidemiological information.  Fact Sheet for Patients:   StrictlyIdeas.no  Fact Sheet for Healthcare Providers: BankingDealers.co.za  This test is not yet approved or  cleared by the [place] FDA and has been authorized for detection and/or diagnosis of SARS-CoV-2 by FDA under an Emergency Use Authorization (EUA).  This EUA will remain in effect (meaning this test can be used) for the duration of the COVID-19 declaration under Section 564(b)(1) of the Act, 21 U.S.C. section 360bbb-3(b)(1), unless the authorization is terminated or revoked sooner.  Performed at [facility], [place] [address], [place], [place] [address]   C Difficile Quick Screen w PCR reflex     Status: None   Collection Time: 06/29/20  9:30 PM   Specimen: STOOL  Result Value Ref Range Status   C Diff antigen NEGATIVE NEGATIVE Final   C Diff toxin NEGATIVE NEGATIVE Final   C Diff interpretation No C. difficile detected.  Final    Comment: Performed at [facility], [place] [address], [place], [place] [address]  Gastrointestinal Panel by PCR , Stool     Status: None   Collection Time: 06/29/20  9:30 PM   Specimen: STOOL  Result Value Ref Range Status   Campylobacter species NOT DETECTED NOT DETECTED Final   Plesimonas shigelloides NOT DETECTED NOT DETECTED Final   Salmonella species NOT DETECTED NOT DETECTED Final   Yersinia enterocolitica NOT DETECTED NOT DETECTED Final   Vibrio species NOT DETECTED NOT DETECTED Final   Vibrio cholerae NOT DETECTED NOT DETECTED Final    Enteroaggregative E coli (EAEC) NOT DETECTED NOT DETECTED Final   Enteropathogenic E coli (EPEC) NOT DETECTED NOT DETECTED Final   Enterotoxigenic E coli (ETEC) NOT DETECTED NOT DETECTED Final   Shiga like toxin producing E coli (STEC) NOT DETECTED NOT DETECTED  Final   Shigella/Enteroinvasive E coli (EIEC) NOT DETECTED NOT DETECTED Final   Cryptosporidium NOT DETECTED NOT DETECTED Final   Cyclospora cayetanensis NOT DETECTED NOT DETECTED Final   Entamoeba histolytica NOT DETECTED NOT DETECTED Final   Giardia lamblia NOT DETECTED NOT DETECTED Final   Adenovirus F40/41 NOT DETECTED NOT DETECTED Final   Astrovirus NOT DETECTED NOT DETECTED Final   Norovirus GI/GII NOT DETECTED NOT DETECTED Final   Rotavirus A NOT DETECTED NOT DETECTED Final   Sapovirus (I, II, IV, and V) NOT DETECTED NOT DETECTED Final    Comment: Performed at [facility], [address] [place], [place] [address]  SARS Coronavirus 2 by RT PCR (hospital order, performed in [facility] hospital lab) Nasopharyngeal Nasopharyngeal Swab     Status: None   Collection Time: 07/02/20  4:41 PM   Specimen: Nasopharyngeal Swab  Result Value Ref Range Status   SARS Coronavirus 2 NEGATIVE NEGATIVE Final    Comment: (NOTE) SARS-CoV-2 target nucleic acids are NOT DETECTED.  The SARS-CoV-2 RNA is generally detectable in upper and lower respiratory specimens during the acute phase of infection. The lowest concentration of SARS-CoV-2 viral copies this assay can detect is 250 copies / mL. A negative result does not preclude SARS-CoV-2 infection and should not be used as the sole basis for treatment or other patient management decisions.  A negative result may occur with improper specimen collection / handling, submission of specimen other than nasopharyngeal swab, presence of viral mutation(s) within the areas targeted by this assay, and inadequate number of viral copies (<250 copies / mL). A negative result must be combined  with clinical observations, patient history, and epidemiological information.  Fact Sheet for Patients:   StrictlyIdeas.no  Fact Sheet for Healthcare Providers: BankingDealers.co.za  This test is not yet approved or  cleared by the [place] FDA and has been authorized for detection and/or diagnosis of SARS-CoV-2 by FDA under an Emergency Use Authorization (EUA).  This EUA will remain in effect (meaning this test can be used) for the duration of the COVID-19 declaration under Section 564(b)(1) of the Act, 21 U.S.C. section 360bbb-3(b)(1), unless the authorization is terminated or revoked sooner.  Performed at [facility], [place] [address], [place], [place] [address]          Radiology Studies: DG Abd 1 View  Result Date: 07/03/2020 CLINICAL DATA:  Distended abdomen. EXAM: ABDOMEN - 1 VIEW COMPARISON:  None. FINDINGS: Diffuse colonic distension is similar the prior exam. No free air is present. IMPRESSION: Similar appearance of diffuse colonic distension. Electronically Signed   By: San Morelle M.D.   On: 07/03/2020 08:00        Scheduled Meds: . amLODipine  5 mg Oral Daily  . bisacodyl  10 mg Oral Daily  . cloNIDine  0.1 mg Oral BID  . diclofenac Sodium  4 g Topical QID  . DULoxetine  40 mg Oral TID  . gabapentin  800 mg Oral TID  . heparin  5,000 Units Subcutaneous Q8H  . hydroxychloroquine  200 mg Oral BID  . methocarbamol  750 mg Oral QID  . oxyCODONE  10 mg Oral QID  . pantoprazole  40 mg Oral Daily  . rosuvastatin  40 mg Oral QPM  . saccharomyces boulardii  250 mg Oral BID  . sodium chloride flush  3 mL Intravenous Once  . sorbitol, milk of mag, mineral oil, glycerin (SMOG) enema  960 mL Rectal Once  . tamsulosin  0.4  mg Oral Daily   Continuous Infusions: . 0.9 % NaCl with KCl 20 mEq / L 50 mL/hr at 07/04/20 0200     LOS: 7 days     Georgette Shell, MD 07/04/2020, 10:11  AM

## 2020-07-04 NOTE — Progress Notes (Signed)
Pt pulled IV out. Pt stated he doesn't need the IV if he isn't going to receive IV pain meds. The pt's behavior was addressed through therapeutic communication.

## 2020-07-05 DIAGNOSIS — N179 Acute kidney failure, unspecified: Secondary | ICD-10-CM | POA: Diagnosis not present

## 2020-07-05 MED ORDER — KETOROLAC TROMETHAMINE 10 MG PO TABS
10.0000 mg | ORAL_TABLET | Freq: Three times a day (TID) | ORAL | Status: DC | PRN
  Administered 2020-07-06 (×2): 10 mg via ORAL
  Filled 2020-07-05 (×5): qty 1

## 2020-07-05 NOTE — Social Work (Signed)
TOC CSW spoke with pts daughter/Gina Kaser, [phone].  [name] stated father wants to go to [facility].  Gina asked that Medicare list be emailed to her at [email or web address] to review as [facility] will continue to follow for dc needs.  Dawood Spitler Tarpley-Carter, MSW, LCSW-A                  [facility] ED Transitions of CareClinical Social Worker [email or web address] [email or web address] [phone]

## 2020-07-05 NOTE — Progress Notes (Addendum)
PROGRESS NOTE    Jorge Sherman  EKC:[id number] DOB: 1941/08/17 DOA: 06/27/2020 PCP: Merrilee Seashore, MD    Brief Narrative: 79 year old male with essential hypertension, chronic back pain related to spinal stenosis, rheumatoid arthritis, OSA noncompliant with CPAP on numerous medications who lives alone and has private caretakers and presents to the hospital EMS for nausea vomiting and diarrhea for a number of days. In the ED> stated that he felt dehydrated and complained of ongoing chronic back pain skin for his Toradol.  He had episode of diarrhea in the ED. Tachycardic with a temperature of 100.6 rectally. Creatinine was noted to be elevated from his baseline and was 1.87 with a BUN of 40, sodium 133, potassium 3.4 and chloride 97. White blood cell count was 27.9 and platelets were 513. The patient was started on IV fluids for dehydration.  Stool studies were ordered.  Assessment & Plan:   Principal Problem:   AKI (acute kidney injury) ([place]) Active Problems:   Chronic pain   Essential hypertension, benign   Acute diarrhea   Ileus ([place])   Declining functional status   Goals of care, counseling/discussion   Palliative care by specialist    #1  AKI-secondary to vomiting and diarrhea and dehydration.resolved.  #2  Colonic ileus -resolved   #3 history of essential hypertension on Norvasc and Catapres  #4 chronic pain-I have stopped his Dilaudid.   Neurontin 800 mg 3 times a day Cymbalta 40 mg 3 times a day Toradol 10 mg q. 8 Robaxin-750 milligram 4 times a day Ativan 0.5 --3 times a day as needed Oxycodone 10 mg 4 times daily  #5 hyperlipidemia on Crestor  #6 BPH on Flomax  #7 depression on cymbalta  #8 leukocytosis resolving.urine culture GNR .continue cipro  Estimated body mass index is 35.87 kg/m as calculated from the following:   Height as of this encounter: 5\' 10"  (1.778 m).   Weight as of this encounter: 113.4 kg.  DVT prophylaxis: heparin Code  Status: full Family Communication: none Disposition Plan:  Status is: Inpatient.colonic ileus on ivf  Dispo: The patient is from:home              Anticipated d/c is to:snf patient and family would like him to be discharged to [facility] rehab which [facility] is working on.              Anticipated d/c date is: unknown              Patient currently is medically stable to be discharged.    Consultants: none  Procedures: none Antimicrobials: Anti-infectives (From admission, onward)   Start     Dose/Rate Route Frequency Ordered Stop   07/04/20 1100  ciprofloxacin (CIPRO) tablet 500 mg     Discontinue     500 mg Oral 2 times daily 07/04/20 1019     07/04/20 0000  ciprofloxacin (CIPRO) 500 MG tablet     Discontinue     500 mg Oral 2 times daily 07/04/20 1026     06/30/20 2200  hydroxychloroquine (PLAQUENIL) tablet 200 mg     Discontinue     200 mg Oral 2 times daily 06/30/20 1635        Subjective: Patient continues to scream with abdominal pain continues to complain of nausea.  Had bowel movements.  Daughter reported he had multiple episodes of UTI in the past.   Objective: Vitals:   07/04/20 0942 07/04/20 1441 07/04/20 2204 07/05/20 0534  BP: (!) 143/71 (!) 141/66  125/72 136/76  Pulse: 87 83 84 71  Resp:  20 17 16   Temp:  98 F (36.7 C) 98.6 F (37 C) 97.7 F (36.5 C)  TempSrc:  Oral  Oral  SpO2:  99% 97% 98%  Weight:      Height:        Intake/Output Summary (Last 24 hours) at 07/05/2020 1215 Last data filed at 07/05/2020 1024 Gross per 24 hour  Intake 1980 ml  Output 1400 ml  Net 580 ml   Filed Weights   06/26/20 1750  Weight: 113.4 kg    Examination:  General exam: Appears calm and comfortable  Respiratory system: Clear to auscultation. Respiratory effort normal. Cardiovascular system: S1 & S2 heard, RRR. No JVD, murmurs, rubs, gallops or clicks. No pedal edema. Gastrointestinal system: Abdomen is distended, soft and nontender. No organomegaly or masses felt.  diminished bowel sounds heard. Central nervous system: Alert and oriented. No focal neurological deficits. Extremities: Symmetric 5 x 5 power. Skin: No rashes, lesions or ulcers Psychiatry: Judgement and insight appear normal. Mood & affect appropriate.     Data Reviewed: I have personally reviewed following labs and imaging studies  CBC: Recent Labs  Lab 06/30/20 1507 07/01/20 0232 07/03/20 1541  WBC 12.8* 13.6* 12.6*  HGB 9.8* 9.2* 9.7*  HCT 29.2* 28.9* 30.5*  MCV 90.1 92.0 91.9  PLT 289 281 119   Basic Metabolic Panel: Recent Labs  Lab 06/28/20 1357 06/30/20 1507 07/01/20 0232 07/03/20 1541  NA 135 132* 130* 134*  K 4.1 4.1 3.9 4.0  CL 104 102 101 100  CO2 24 23 24 28   GLUCOSE 144* 135* 122* 113*  BUN 30* 12 12 9   CREATININE 1.03 0.61 0.83 0.82  CALCIUM 8.6* 8.4* 8.3* 8.9   GFR: Estimated Creatinine Clearance: 92.2 mL/min (by C-G formula based on SCr of 0.82 mg/dL). Liver Function Tests: Recent Labs  Lab 07/03/20 1541  AST 20  ALT 21  ALKPHOS 50  BILITOT 0.6  PROT 5.7*  ALBUMIN 3.2*   No results for input(s): LIPASE, AMYLASE in the last 168 hours. No results for input(s): AMMONIA in the last 168 hours. Coagulation Profile: No results for input(s): INR, PROTIME in the last 168 hours. Cardiac Enzymes: No results for input(s): CKTOTAL, CKMB, CKMBINDEX, TROPONINI in the last 168 hours. BNP (last 3 results) No results for input(s): PROBNP in the last 8760 hours. HbA1C: No results for input(s): HGBA1C in the last 72 hours. CBG: No results for input(s): GLUCAP in the last 168 hours. Lipid Profile: No results for input(s): CHOL, HDL, LDLCALC, TRIG, CHOLHDL, LDLDIRECT in the last 72 hours. Thyroid Function Tests: No results for input(s): TSH, T4TOTAL, FREET4, T3FREE, THYROIDAB in the last 72 hours. Anemia Panel: No results for input(s): VITAMINB12, FOLATE, FERRITIN, TIBC, IRON, RETICCTPCT in the last 72 hours. Sepsis Labs: No results for input(s):  PROCALCITON, LATICACIDVEN in the last 168 hours.  Recent Results (from the past 240 hour(s))  Culture, blood (routine x 2)     Status: None   Collection Time: 06/27/20 10:27 AM   Specimen: BLOOD  Result Value Ref Range Status   Specimen Description   Final    BLOOD RIGHT ANTECUBITAL Performed at [facility], [place] [address], [place], [place] [address]    Special Requests   Final    BOTTLES DRAWN AEROBIC AND ANAEROBIC Blood Culture adequate volume Performed at [place] [address] [place], [place] [address]    Culture  Final    NO GROWTH 5 DAYS Performed at [facility], [place] [address], [place], [place] [address]    Report Status 07/02/2020 FINAL  Final  SARS Coronavirus 2 by RT PCR (hospital order, performed in [facility] hospital lab) Nasopharyngeal Nasopharyngeal Swab     Status: None   Collection Time: 06/27/20  1:39 PM   Specimen: Nasopharyngeal Swab  Result Value Ref Range Status   SARS Coronavirus 2 NEGATIVE NEGATIVE Final    Comment: (NOTE) SARS-CoV-2 target nucleic acids are NOT DETECTED.  The SARS-CoV-2 RNA is generally detectable in upper and lower respiratory specimens during the acute phase of infection. The lowest concentration of SARS-CoV-2 viral copies this assay can detect is 250 copies / mL. A negative result does not preclude SARS-CoV-2 infection and should not be used as the sole basis for treatment or other patient management decisions.  A negative result may occur with improper specimen collection / handling, submission of specimen other than nasopharyngeal swab, presence of viral mutation(s) within the areas targeted by this assay, and inadequate number of viral copies (<250 copies / mL). A negative result must be combined with clinical observations, patient history, and epidemiological information.  Fact Sheet for Patients:   StrictlyIdeas.no  Fact Sheet for Healthcare  Providers: BankingDealers.co.za  This test is not yet approved or  cleared by the [place] FDA and has been authorized for detection and/or diagnosis of SARS-CoV-2 by FDA under an Emergency Use Authorization (EUA).  This EUA will remain in effect (meaning this test can be used) for the duration of the COVID-19 declaration under Section 564(b)(1) of the Act, 21 U.S.C. section 360bbb-3(b)(1), unless the authorization is terminated or revoked sooner.  Performed at [facility], [place] [address], [place], [place] [address]   C Difficile Quick Screen w PCR reflex     Status: None   Collection Time: 06/29/20  9:30 PM   Specimen: STOOL  Result Value Ref Range Status   C Diff antigen NEGATIVE NEGATIVE Final   C Diff toxin NEGATIVE NEGATIVE Final   C Diff interpretation No C. difficile detected.  Final    Comment: Performed at [facility], [place] [address], [place], [place] [address]  Gastrointestinal Panel by PCR , Stool     Status: None   Collection Time: 06/29/20  9:30 PM   Specimen: STOOL  Result Value Ref Range Status   Campylobacter species NOT DETECTED NOT DETECTED Final   Plesimonas shigelloides NOT DETECTED NOT DETECTED Final   Salmonella species NOT DETECTED NOT DETECTED Final   Yersinia enterocolitica NOT DETECTED NOT DETECTED Final   Vibrio species NOT DETECTED NOT DETECTED Final   Vibrio cholerae NOT DETECTED NOT DETECTED Final   Enteroaggregative E coli (EAEC) NOT DETECTED NOT DETECTED Final   Enteropathogenic E coli (EPEC) NOT DETECTED NOT DETECTED Final   Enterotoxigenic E coli (ETEC) NOT DETECTED NOT DETECTED Final   Shiga like toxin producing E coli (STEC) NOT DETECTED NOT DETECTED Final   Shigella/Enteroinvasive E coli (EIEC) NOT DETECTED NOT DETECTED Final   Cryptosporidium NOT DETECTED NOT DETECTED Final   Cyclospora cayetanensis NOT DETECTED NOT DETECTED Final   Entamoeba histolytica NOT DETECTED NOT  DETECTED Final   Giardia lamblia NOT DETECTED NOT DETECTED Final   Adenovirus F40/41 NOT DETECTED NOT DETECTED Final   Astrovirus NOT DETECTED NOT DETECTED Final   Norovirus GI/GII NOT DETECTED NOT DETECTED Final   Rotavirus A NOT DETECTED NOT DETECTED Final  Sapovirus (I, II, IV, and V) NOT DETECTED NOT DETECTED Final    Comment: Performed at [facility], [place] [place], [place] [address]  SARS Coronavirus 2 by RT PCR (hospital order, performed in [facility] hospital lab) Nasopharyngeal Nasopharyngeal Swab     Status: None   Collection Time: 07/02/20  4:41 PM   Specimen: Nasopharyngeal Swab  Result Value Ref Range Status   SARS Coronavirus 2 NEGATIVE NEGATIVE Final    Comment: (NOTE) SARS-CoV-2 target nucleic acids are NOT DETECTED.  The SARS-CoV-2 RNA is generally detectable in upper and lower respiratory specimens during the acute phase of infection. The lowest concentration of SARS-CoV-2 viral copies this assay can detect is 250 copies / mL. A negative result does not preclude SARS-CoV-2 infection and should not be used as the sole basis for treatment or other patient management decisions.  A negative result may occur with improper specimen collection / handling, submission of specimen other than nasopharyngeal swab, presence of viral mutation(s) within the areas targeted by this assay, and inadequate number of viral copies (<250 copies / mL). A negative result must be combined with clinical observations, patient history, and epidemiological information.  Fact Sheet for Patients:   StrictlyIdeas.no  Fact Sheet for Healthcare Providers: BankingDealers.co.za  This test is not yet approved or  cleared by the [place] FDA and has been authorized for detection and/or diagnosis of SARS-CoV-2 by FDA under an Emergency Use Authorization (EUA).  This EUA will remain in effect (meaning this test can be used) for  the duration of the COVID-19 declaration under Section 564(b)(1) of the Act, 21 U.S.C. section 360bbb-3(b)(1), unless the authorization is terminated or revoked sooner.  Performed at [facility], [place] [address] [place], [place] [address]   Urine Culture     Status: Abnormal (Preliminary result)   Collection Time: 07/03/20 11:45 AM   Specimen: Urine, Random  Result Value Ref Range Status   Specimen Description   Final    URINE, RANDOM Performed at [place] [address] [place], [place] [address]    Special Requests   Final    NONE Performed at [facility], [place] [address] [place], [place] [address]    Culture >=100,000 COLONIES/mL GRAM NEGATIVE RODS (A)  Final   Report Status PENDING  Incomplete         Radiology Studies: No results found.      Scheduled Meds: . amLODipine  5 mg Oral Daily  . bisacodyl  10 mg Oral Daily  . ciprofloxacin  500 mg Oral BID  . cloNIDine  0.1 mg Oral BID  . diclofenac Sodium  4 g Topical QID  . DULoxetine  40 mg Oral TID  . gabapentin  800 mg Oral TID  . heparin  5,000 Units Subcutaneous Q8H  . hydroxychloroquine  200 mg Oral BID  . methocarbamol  750 mg Oral QID  . oxyCODONE  10 mg Oral QID  . pantoprazole  40 mg Oral Daily  . rosuvastatin  40 mg Oral QPM  . saccharomyces boulardii  250 mg Oral BID  . sodium chloride flush  3 mL Intravenous Once  . sorbitol, milk of mag, mineral oil, glycerin (SMOG) enema  960 mL Rectal Once  . tamsulosin  0.4 mg Oral Daily   Continuous Infusions: . 0.9 % NaCl with KCl 20 mEq / L Stopped (07/04/20 1000)     LOS: 8 days     Noland Fordyce  Rodena Piety, MD 07/05/2020, 12:15 PM

## 2020-07-05 NOTE — Social Work (Signed)
TOC CSW reached out to [facility] [phone], there was no answer.  CSW left HIPPA compliant message with my contact information.  CSW also reached out to pts daughter/Gina Maack [phone].  CSW left HIPPA compliant message with my contact information.  Jorge Sherman, MSW, [place] ED Transitions of Care Clinical Social Worker [email or web address] [phone]

## 2020-07-06 DIAGNOSIS — N179 Acute kidney failure, unspecified: Secondary | ICD-10-CM | POA: Diagnosis not present

## 2020-07-06 LAB — URINE CULTURE: Culture: >=100000 — AB

## 2020-07-06 LAB — SARS CORONAVIRUS 2 BY RT PCR (HOSPITAL ORDER, PERFORMED IN ~~LOC~~ HOSPITAL LAB): SARS Coronavirus 2: NEGATIVE

## 2020-07-06 MED ORDER — LORAZEPAM 1 MG PO TABS: 2.0000 mg | ORAL_TABLET | Freq: Three times a day (TID) | ORAL | 0 refills | Status: DC | PRN

## 2020-07-06 MED ORDER — OXYCODONE HCL 20 MG PO TABS: 1.0000 | ORAL_TABLET | Freq: Four times a day (QID) | ORAL | 0 refills | Status: DC | PRN

## 2020-07-06 NOTE — Care Management Important Message (Signed)
Important Message  Patient Details IM Letter given to the Patient Name: Jorge Sherman MRN: [id number] Date of Birth: [birth year]   Medicare Important Message Given:  Yes     Kerin Salen 07/06/2020, 1:23 PM

## 2020-07-06 NOTE — TOC Progression Note (Signed)
Transition of Care [facility]) - Progression Note    Patient Details  Name: MALIKYE REPPOND MRN: [id number] Date of Birth: [birth year]  [facility] CM/SW Contact  Lennart Pall, LCSW Phone Number: 07/06/2020, 9:50 AM  Clinical Narrative:    Met with pt and left VM for daughter this morning explaining that I am aware they had requested [facility] in [place], however, this is NOT a SNF facility but a CIR facility and not what has been recommended.  Have received 5 SNF bed offers and he needs to make a choice.  Pt wants me to speak with his daughter and I will continue to reach out.  He is medically ready for d/c to SNF today and have explained that we will need to proceed.  MD aware need for another COVID test today (rapid).     Expected Discharge Plan: [place] Barriers to Discharge: Continued Medical Work up  Expected Discharge Plan and Services Expected Discharge Plan: [place] In-house Referral: Clinical Social Work     Living arrangements for the past 2 months: Single Family Home Expected Discharge Date: 07/04/20                                     Social Determinants of Health (SDOH) Interventions    Readmission Risk Interventions Readmission Risk Prevention Plan 06/29/2020  Transportation Screening Complete  Home Care Screening Complete  Medication Review (RN CM) Complete  Some recent data might be hidden

## 2020-07-06 NOTE — Progress Notes (Signed)
Report called to [facility] spoke with danielle who took report D Mateo Flow RN

## 2020-07-06 NOTE — Plan of Care (Signed)
  Problem: Education: Goal: Knowledge of General Education information will improve Description: Including pain rating scale, medication(s)/side effects and non-pharmacologic comfort measures Outcome: Adequate for Discharge   Problem: Health Behavior/Discharge Planning: Goal: Ability to manage health-related needs will improve Outcome: Adequate for Discharge   Problem: Clinical Measurements: Goal: Ability to maintain clinical measurements within normal limits will improve Outcome: Adequate for Discharge Goal: Will remain free from infection Outcome: Adequate for Discharge Goal: Diagnostic test results will improve Outcome: Adequate for Discharge Goal: Respiratory complications will improve Outcome: Adequate for Discharge Goal: Cardiovascular complication will be avoided Outcome: Adequate for Discharge   Problem: Activity: Goal: Risk for activity intolerance will decrease Outcome: Adequate for Discharge   Problem: Nutrition: Goal: Adequate nutrition will be maintained Outcome: Adequate for Discharge   Problem: Coping: Goal: Level of anxiety will decrease Outcome: Adequate for Discharge   Problem: Elimination: Goal: Will not experience complications related to bowel motility Outcome: Adequate for Discharge Goal: Will not experience complications related to urinary retention Outcome: Adequate for Discharge   Problem: Pain Managment: Goal: General experience of comfort will improve Outcome: Adequate for Discharge   Problem: Safety: Goal: Ability to remain free from injury will improve Outcome: Adequate for Discharge   Problem: Skin Integrity: Goal: Risk for impaired skin integrity will decrease Outcome: Adequate for Discharge   Problem: Acute Rehab PT Goals(only PT should resolve) Goal: Pt Will Go Supine/Side To Sit Outcome: Adequate for Discharge Goal: Pt Will Go Sit To Supine/Side Outcome: Adequate for Discharge Goal: Patient Will Transfer Sit To/From  Stand Outcome: Adequate for Discharge Goal: Pt Will Transfer Bed To Chair/Chair To Bed Outcome: Adequate for Discharge   Problem: Acute Rehab OT Goals (only OT should resolve) Goal: Pt. Will Perform Eating Outcome: Adequate for Discharge Goal: Pt. Will Perform Grooming Outcome: Adequate for Discharge Goal: Pt. Will Transfer To Toilet Outcome: Adequate for Discharge Goal: Pt. Will Perform Toileting-Clothing Manipulation Outcome: Adequate for Discharge

## 2020-07-06 NOTE — TOC Transition Note (Signed)
Transition of Care [facility]) - CM/SW Discharge Note   Patient Details  Name: Jorge Sherman MRN: [id number] Date of Birth: 1941-06-27  [facility] CM/SW Contact:  Lennart Pall, LCSW Phone Number: 07/06/2020, 12:48 PM   Clinical Narrative:     Have reviewed all SNf bed offers with pt and daughter.  They are accepting offer from [facility] and aware pt will be placed in 2 week quarantine due to not being vaccinated.  MD and team aware.  Will transport via [facility].  Final next level of care: Skilled Nursing Facility Barriers to Discharge: Barriers Resolved   Patient Goals and CMS Choice Patient states their goals for this hospitalization and ongoing recovery are:: to eventually get back home CMS Medicare.gov Compare Post Acute Care list provided to:: Patient Choice offered to / list presented to : Patient  Discharge Placement              Patient chooses bed at: [facility] Patient to be transferred to facility by: [facility] Name of family member notified: daughter, [name] Patient and family notified of of transfer: 07/06/20  Discharge Plan and Services In-house Referral: Clinical Social Work              DME Arranged: N/A DME Agency: NA       HH Arranged: NA HH Agency: NA        Social Determinants of Health (SDOH) Interventions     Readmission Risk Interventions Readmission Risk Prevention Plan 06/29/2020  Transportation Screening Complete  Home Care Screening Complete  Medication Review (RN CM) Complete  Some recent data might be hidden

## 2020-10-29 ENCOUNTER — Encounter (HOSPITAL_COMMUNITY): Payer: Self-pay

## 2020-10-29 ENCOUNTER — Other Ambulatory Visit: Payer: Self-pay

## 2020-10-29 ENCOUNTER — Inpatient Hospital Stay (HOSPITAL_COMMUNITY)
Admission: EM | Admit: 2020-10-29 | Discharge: 2020-11-03 | DRG: 641 | Disposition: A | Discharge status: Home Health | Payer: Medicare HMO | Attending: Internal Medicine | Admitting: Internal Medicine

## 2020-10-29 ENCOUNTER — Emergency Department (HOSPITAL_COMMUNITY): Payer: Medicare HMO

## 2020-10-29 DIAGNOSIS — Z20822 Contact with and (suspected) exposure to covid-19: Secondary | ICD-10-CM | POA: Diagnosis present

## 2020-10-29 DIAGNOSIS — Z833 Family history of diabetes mellitus: Secondary | ICD-10-CM

## 2020-10-29 DIAGNOSIS — E119 Type 2 diabetes mellitus without complications: Secondary | ICD-10-CM

## 2020-10-29 DIAGNOSIS — E860 Dehydration: Secondary | ICD-10-CM | POA: Diagnosis not present

## 2020-10-29 DIAGNOSIS — G4733 Obstructive sleep apnea (adult) (pediatric): Secondary | ICD-10-CM | POA: Diagnosis present

## 2020-10-29 DIAGNOSIS — E1169 Type 2 diabetes mellitus with other specified complication: Secondary | ICD-10-CM | POA: Diagnosis present

## 2020-10-29 DIAGNOSIS — M19012 Primary osteoarthritis, left shoulder: Secondary | ICD-10-CM | POA: Diagnosis present

## 2020-10-29 DIAGNOSIS — W06XXXA Fall from bed, initial encounter: Secondary | ICD-10-CM | POA: Diagnosis present

## 2020-10-29 DIAGNOSIS — G894 Chronic pain syndrome: Secondary | ICD-10-CM | POA: Diagnosis present

## 2020-10-29 DIAGNOSIS — Z823 Family history of stroke: Secondary | ICD-10-CM

## 2020-10-29 DIAGNOSIS — R0789 Other chest pain: Secondary | ICD-10-CM | POA: Diagnosis present

## 2020-10-29 DIAGNOSIS — M069 Rheumatoid arthritis, unspecified: Secondary | ICD-10-CM | POA: Diagnosis present

## 2020-10-29 DIAGNOSIS — M19011 Primary osteoarthritis, right shoulder: Secondary | ICD-10-CM | POA: Diagnosis present

## 2020-10-29 DIAGNOSIS — N400 Enlarged prostate without lower urinary tract symptoms: Secondary | ICD-10-CM | POA: Diagnosis not present

## 2020-10-29 DIAGNOSIS — G250 Essential tremor: Secondary | ICD-10-CM | POA: Diagnosis present

## 2020-10-29 DIAGNOSIS — Z888 Allergy status to other drugs, medicaments and biological substances status: Secondary | ICD-10-CM

## 2020-10-29 DIAGNOSIS — Z794 Long term (current) use of insulin: Secondary | ICD-10-CM

## 2020-10-29 DIAGNOSIS — W19XXXA Unspecified fall, initial encounter: Secondary | ICD-10-CM

## 2020-10-29 DIAGNOSIS — Z87891 Personal history of nicotine dependence: Secondary | ICD-10-CM

## 2020-10-29 DIAGNOSIS — Z66 Do not resuscitate: Secondary | ICD-10-CM | POA: Diagnosis present

## 2020-10-29 DIAGNOSIS — Z91041 Radiographic dye allergy status: Secondary | ICD-10-CM

## 2020-10-29 DIAGNOSIS — Z9119 Patient's noncompliance with other medical treatment and regimen: Secondary | ICD-10-CM

## 2020-10-29 DIAGNOSIS — E871 Hypo-osmolality and hyponatremia: Secondary | ICD-10-CM | POA: Diagnosis present

## 2020-10-29 DIAGNOSIS — F410 Panic disorder [episodic paroxysmal anxiety] without agoraphobia: Secondary | ICD-10-CM | POA: Diagnosis present

## 2020-10-29 DIAGNOSIS — Z79899 Other long term (current) drug therapy: Secondary | ICD-10-CM

## 2020-10-29 DIAGNOSIS — R296 Repeated falls: Secondary | ICD-10-CM | POA: Diagnosis present

## 2020-10-29 DIAGNOSIS — I2510 Atherosclerotic heart disease of native coronary artery without angina pectoris: Secondary | ICD-10-CM

## 2020-10-29 DIAGNOSIS — Y92009 Unspecified place in unspecified non-institutional (private) residence as the place of occurrence of the external cause: Secondary | ICD-10-CM

## 2020-10-29 DIAGNOSIS — Z9181 History of falling: Secondary | ICD-10-CM

## 2020-10-29 DIAGNOSIS — E782 Mixed hyperlipidemia: Secondary | ICD-10-CM | POA: Diagnosis present

## 2020-10-29 DIAGNOSIS — I10 Essential (primary) hypertension: Secondary | ICD-10-CM | POA: Diagnosis present

## 2020-10-29 DIAGNOSIS — Z8249 Family history of ischemic heart disease and other diseases of the circulatory system: Secondary | ICD-10-CM

## 2020-10-29 DIAGNOSIS — F32A Depression, unspecified: Secondary | ICD-10-CM | POA: Diagnosis present

## 2020-10-29 LAB — RESP PANEL BY RT-PCR (FLU A&B, COVID) ARPGX2
Influenza A by PCR: NEGATIVE
Influenza B by PCR: NEGATIVE
SARS Coronavirus 2 by RT PCR: NEGATIVE

## 2020-10-29 LAB — COMPREHENSIVE METABOLIC PANEL
ALT: 22 U/L (ref 0–44)
AST: 26 U/L (ref 15–41)
Albumin: 4.1 g/dL (ref 3.5–5.0)
Alkaline Phosphatase: 62 U/L (ref 38–126)
Anion gap: 13 (ref 5–15)
BUN: 14 mg/dL (ref 8–23)
CO2: 26 mmol/L (ref 22–32)
Calcium: 9.7 mg/dL (ref 8.9–10.3)
Chloride: 91 mmol/L — ABNORMAL LOW (ref 98–111)
Creatinine, Ser: 0.79 mg/dL (ref 0.61–1.24)
GFR, Estimated: >60 mL/min (ref >60)
Glucose, Bld: 101 mg/dL — ABNORMAL HIGH (ref 70–99)
Potassium: 4.4 mmol/L (ref 3.5–5.1)
Sodium: 130 mmol/L — ABNORMAL LOW (ref 135–145)
Total Bilirubin: 0.5 mg/dL (ref 0.3–1.2)
Total Protein: 7 g/dL (ref 6.5–8.1)

## 2020-10-29 LAB — URINALYSIS, COMPLETE (UACMP) WITH MICROSCOPIC
Bacteria, UA: NONE SEEN
Bilirubin Urine: NEGATIVE
Glucose, UA: NEGATIVE mg/dL
Hgb urine dipstick: NEGATIVE
Ketones, ur: NEGATIVE mg/dL
Leukocytes,Ua: NEGATIVE
Nitrite: NEGATIVE
Protein, ur: NEGATIVE mg/dL
Specific Gravity, Urine: 1.009 (ref 1.005–1.030)
pH: 7 (ref 5.0–8.0)

## 2020-10-29 LAB — VITAMIN B12: Vitamin B-12: 355 pg/mL (ref 180–914)

## 2020-10-29 LAB — CBC WITH DIFFERENTIAL/PLATELET
Abs Immature Granulocytes: 0.4 10*3/uL — ABNORMAL HIGH (ref 0.00–0.07)
Basophils Absolute: 0.1 10*3/uL (ref 0.0–0.1)
Basophils Relative: 0 %
Eosinophils Absolute: 0.1 10*3/uL (ref 0.0–0.5)
Eosinophils Relative: 0 %
HCT: 40.7 % (ref 39.0–52.0)
Hemoglobin: 13.2 g/dL (ref 13.0–17.0)
Immature Granulocytes: 2 %
Lymphocytes Relative: 10 %
Lymphs Abs: 2.2 10*3/uL (ref 0.7–4.0)
MCH: 29.8 pg (ref 26.0–34.0)
MCHC: 32.4 g/dL (ref 30.0–36.0)
MCV: 91.9 fL (ref 80.0–100.0)
Monocytes Absolute: 2 10*3/uL — ABNORMAL HIGH (ref 0.1–1.0)
Monocytes Relative: 9 %
Neutro Abs: 16.4 10*3/uL — ABNORMAL HIGH (ref 1.7–7.7)
Neutrophils Relative %: 79 %
Platelets: 443 10*3/uL — ABNORMAL HIGH (ref 150–400)
RBC: 4.43 MIL/uL (ref 4.22–5.81)
RDW: 13.5 % (ref 11.5–15.5)
WBC: 21.1 10*3/uL — ABNORMAL HIGH (ref 4.0–10.5)
nRBC: 0 % (ref 0.0–0.2)

## 2020-10-29 LAB — LACTIC ACID, PLASMA: Lactic Acid, Venous: 0.9 mmol/L (ref 0.5–1.9)

## 2020-10-29 LAB — FOLATE: Folate: 21.9 ng/mL (ref >5.9)

## 2020-10-29 LAB — CK: Total CK: 65 U/L (ref 49–397)

## 2020-10-29 LAB — HEMOGLOBIN A1C
Hgb A1c MFr Bld: 5.7 % — ABNORMAL HIGH (ref 4.8–5.6)
Mean Plasma Glucose: 116.89 mg/dL

## 2020-10-29 LAB — TROPONIN I (HIGH SENSITIVITY): Troponin I (High Sensitivity): 7 ng/L (ref <18)

## 2020-10-29 LAB — TSH: TSH: 1.374 u[IU]/mL (ref 0.350–4.500)

## 2020-10-29 LAB — GLUCOSE, CAPILLARY: Glucose-Capillary: 107 mg/dL — ABNORMAL HIGH (ref 70–99)

## 2020-10-29 MED ORDER — LORAZEPAM 1 MG PO TABS
2.0000 mg | ORAL_TABLET | Freq: Three times a day (TID) | ORAL | Status: DC | PRN
  Administered 2020-10-30 – 2020-11-03 (×9): 2 mg via ORAL
  Filled 2020-10-29 (×10): qty 2

## 2020-10-29 MED ORDER — ONDANSETRON HCL 4 MG PO TABS
4.0000 mg | ORAL_TABLET | Freq: Four times a day (QID) | ORAL | Status: DC | PRN
  Administered 2020-11-02: 4 mg via ORAL
  Filled 2020-10-29: qty 1

## 2020-10-29 MED ORDER — GABAPENTIN 400 MG PO CAPS
800.0000 mg | ORAL_CAPSULE | Freq: Three times a day (TID) | ORAL | Status: DC
  Administered 2020-10-29 – 2020-11-03 (×14): 800 mg via ORAL
  Filled 2020-10-29 (×14): qty 2

## 2020-10-29 MED ORDER — ACETAMINOPHEN 325 MG PO TABS
650.0000 mg | ORAL_TABLET | Freq: Four times a day (QID) | ORAL | Status: DC | PRN
  Administered 2020-10-29 – 2020-11-02 (×4): 650 mg via ORAL
  Filled 2020-10-29 (×5): qty 2

## 2020-10-29 MED ORDER — ONDANSETRON HCL 4 MG/2ML IJ SOLN: 4.0000 mg | Freq: Once | INTRAMUSCULAR | Status: AC

## 2020-10-29 MED ORDER — LORAZEPAM 1 MG PO TABS
1.0000 mg | ORAL_TABLET | Freq: Once | ORAL | Status: AC
  Administered 2020-10-29: 1 mg via ORAL
  Filled 2020-10-29: qty 1

## 2020-10-29 MED ORDER — ACETAMINOPHEN 650 MG RE SUPP: 650.0000 mg | Freq: Four times a day (QID) | RECTAL | Status: DC | PRN

## 2020-10-29 MED ORDER — LINACLOTIDE 72 MCG PO CAPS
72.0000 ug | ORAL_CAPSULE | Freq: Every day | ORAL | Status: DC
  Administered 2020-11-01 – 2020-11-03 (×3): 72 ug via ORAL
  Filled 2020-10-29 (×5): qty 1

## 2020-10-29 MED ORDER — HYDROXYCHLOROQUINE SULFATE 200 MG PO TABS
200.0000 mg | ORAL_TABLET | Freq: Two times a day (BID) | ORAL | Status: DC
  Administered 2020-10-29 – 2020-11-03 (×10): 200 mg via ORAL
  Filled 2020-10-29 (×10): qty 1

## 2020-10-29 MED ORDER — PANTOPRAZOLE SODIUM 40 MG PO TBEC
40.0000 mg | DELAYED_RELEASE_TABLET | Freq: Every day | ORAL | Status: DC
  Administered 2020-10-29 – 2020-11-03 (×6): 40 mg via ORAL
  Filled 2020-10-29 (×6): qty 1

## 2020-10-29 MED ORDER — TAMSULOSIN HCL 0.4 MG PO CAPS
0.4000 mg | ORAL_CAPSULE | Freq: Every day | ORAL | Status: DC
  Administered 2020-10-30 – 2020-11-03 (×5): 0.4 mg via ORAL
  Filled 2020-10-29 (×5): qty 1

## 2020-10-29 MED ORDER — ENOXAPARIN SODIUM 40 MG/0.4ML ~~LOC~~ SOLN
40.0000 mg | SUBCUTANEOUS | Status: DC
  Administered 2020-10-29 – 2020-11-02 (×5): 40 mg via SUBCUTANEOUS
  Filled 2020-10-29 (×5): qty 0.4

## 2020-10-29 MED ORDER — KETOROLAC TROMETHAMINE 30 MG/ML IJ SOLN
30.0000 mg | Freq: Once | INTRAMUSCULAR | Status: AC
  Administered 2020-10-29: 30 mg via INTRAMUSCULAR
  Filled 2020-10-29: qty 1

## 2020-10-29 MED ORDER — OXYCODONE-ACETAMINOPHEN 5-325 MG PO TABS
1.0000 | ORAL_TABLET | ORAL | Status: DC | PRN
  Filled 2020-10-29: qty 1

## 2020-10-29 MED ORDER — METHOCARBAMOL 500 MG PO TABS
1000.0000 mg | ORAL_TABLET | Freq: Three times a day (TID) | ORAL | Status: DC | PRN
  Administered 2020-10-29 – 2020-11-02 (×8): 1000 mg via ORAL
  Filled 2020-10-29 (×10): qty 2

## 2020-10-29 MED ORDER — SODIUM CHLORIDE 0.9 % IV BOLUS
1000.0000 mL | Freq: Once | INTRAVENOUS | Status: AC
  Administered 2020-10-29: 1000 mL via INTRAVENOUS

## 2020-10-29 MED ORDER — AMLODIPINE BESYLATE 5 MG PO TABS
2.5000 mg | ORAL_TABLET | Freq: Every day | ORAL | Status: DC
  Administered 2020-10-29 – 2020-11-03 (×6): 2.5 mg via ORAL
  Filled 2020-10-29 (×6): qty 1

## 2020-10-29 MED ORDER — MORPHINE SULFATE (PF) 4 MG/ML IV SOLN
4.0000 mg | INTRAVENOUS | Status: DC | PRN
  Administered 2020-10-29 – 2020-10-30 (×3): 4 mg via INTRAVENOUS
  Filled 2020-10-29 (×3): qty 1

## 2020-10-29 MED ORDER — ALBUTEROL SULFATE HFA 108 (90 BASE) MCG/ACT IN AERS
2.0000 | INHALATION_SPRAY | RESPIRATORY_TRACT | Status: DC | PRN
  Administered 2020-10-31 – 2020-11-02 (×7): 2 via RESPIRATORY_TRACT
  Filled 2020-10-29 (×2): qty 6.7

## 2020-10-29 MED ORDER — DULOXETINE HCL 30 MG PO CPEP: 30.0000 mg | ORAL_CAPSULE | Freq: Three times a day (TID) | ORAL | Status: DC

## 2020-10-29 MED ORDER — ASPIRIN EC 81 MG PO TBEC
81.0000 mg | DELAYED_RELEASE_TABLET | Freq: Every day | ORAL | Status: DC
  Administered 2020-10-30 – 2020-11-03 (×5): 81 mg via ORAL
  Filled 2020-10-29 (×5): qty 1

## 2020-10-29 MED ORDER — OXYCODONE-ACETAMINOPHEN 5-325 MG PO TABS
1.0000 | ORAL_TABLET | Freq: Once | ORAL | Status: AC
  Administered 2020-10-29: 1 via ORAL
  Filled 2020-10-29: qty 1

## 2020-10-29 MED ORDER — LACTATED RINGERS IV SOLN: INTRAVENOUS | Status: DC

## 2020-10-29 MED ORDER — HYDROMORPHONE HCL 1 MG/ML IJ SOLN
1.0000 mg | Freq: Once | INTRAMUSCULAR | Status: AC
  Administered 2020-10-29: 1 mg via INTRAVENOUS
  Filled 2020-10-29: qty 1

## 2020-10-29 MED ORDER — DULOXETINE HCL 30 MG PO CPEP
30.0000 mg | ORAL_CAPSULE | Freq: Two times a day (BID) | ORAL | Status: DC
  Administered 2020-10-29 – 2020-11-03 (×10): 30 mg via ORAL
  Filled 2020-10-29 (×11): qty 1

## 2020-10-29 MED ORDER — CLONIDINE HCL 0.1 MG PO TABS
0.1000 mg | ORAL_TABLET | Freq: Two times a day (BID) | ORAL | Status: DC
  Administered 2020-10-29 – 2020-11-03 (×10): 0.1 mg via ORAL
  Filled 2020-10-29 (×10): qty 1

## 2020-10-29 MED ORDER — ACETAMINOPHEN 325 MG PO TABS
650.0000 mg | ORAL_TABLET | Freq: Once | ORAL | Status: AC
  Administered 2020-10-29: 650 mg via ORAL
  Filled 2020-10-29: qty 2

## 2020-10-29 MED ORDER — ROSUVASTATIN CALCIUM 20 MG PO TABS
40.0000 mg | ORAL_TABLET | Freq: Every evening | ORAL | Status: DC
  Administered 2020-10-29 – 2020-11-02 (×5): 40 mg via ORAL
  Filled 2020-10-29: qty 8
  Filled 2020-10-29 (×3): qty 2
  Filled 2020-10-29: qty 8
  Filled 2020-10-29: qty 2

## 2020-10-29 MED ORDER — SACCHAROMYCES BOULARDII 250 MG PO CAPS
250.0000 mg | ORAL_CAPSULE | Freq: Two times a day (BID) | ORAL | Status: DC
  Administered 2020-10-29 – 2020-11-03 (×10): 250 mg via ORAL
  Filled 2020-10-29 (×10): qty 1

## 2020-10-29 MED ORDER — INSULIN ASPART 100 UNIT/ML ~~LOC~~ SOLN
0.0000 [IU] | Freq: Three times a day (TID) | SUBCUTANEOUS | Status: DC
  Administered 2020-10-30: 2 [IU] via SUBCUTANEOUS
  Administered 2020-11-01: 1 [IU] via SUBCUTANEOUS

## 2020-10-29 MED ORDER — MORPHINE SULFATE (PF) 4 MG/ML IV SOLN
INTRAVENOUS | Status: AC
  Administered 2020-10-29: 4 mg via INTRAVENOUS
  Filled 2020-10-29: qty 1

## 2020-10-29 MED ORDER — ONDANSETRON HCL 4 MG/2ML IJ SOLN
INTRAMUSCULAR | Status: AC
  Administered 2020-10-29: 4 mg via INTRAVENOUS
  Filled 2020-10-29: qty 2

## 2020-10-29 MED ORDER — MORPHINE SULFATE (PF) 4 MG/ML IV SOLN: 4.0000 mg | Freq: Once | INTRAVENOUS | Status: AC

## 2020-10-29 MED ORDER — ONDANSETRON HCL 4 MG/2ML IJ SOLN
4.0000 mg | Freq: Four times a day (QID) | INTRAMUSCULAR | Status: DC | PRN
  Administered 2020-10-29: 4 mg via INTRAVENOUS
  Filled 2020-10-29: qty 2

## 2020-10-29 MED ORDER — POLYETHYLENE GLYCOL 3350 17 G PO PACK
17.0000 g | PACK | Freq: Every day | ORAL | Status: DC | PRN
  Administered 2020-10-31 – 2020-11-02 (×2): 17 g via ORAL
  Filled 2020-10-29 (×2): qty 1

## 2020-10-29 MED ORDER — CELECOXIB 200 MG PO CAPS
200.0000 mg | ORAL_CAPSULE | Freq: Two times a day (BID) | ORAL | Status: DC
  Administered 2020-10-29 – 2020-11-01 (×7): 200 mg via ORAL
  Filled 2020-10-29 (×8): qty 1

## 2020-10-29 NOTE — ED Notes (Signed)
PTAR called @ 1529-per Dylan, RN called by Levada Dy

## 2020-10-29 NOTE — Discharge Instructions (Addendum)
You have been evaluated for your fall.  We have obtain CT scan acute head, and your chest as well as x-rays of your right shoulder and your ribs.  Fortunately no evidence of any broken bone or internal injury were noted.  It is important that you follow-up with your primary care doctor for further evaluation of your condition.  You may take over-the-counter Tylenol or ibuprofen as needed for pain.-You are always welcome to return to the ED if your symptoms worsen or if you have any other concern.

## 2020-10-29 NOTE — ED Notes (Signed)
Pt states he has developed a dull central 5/10 nonradiating CP w/ associated SOB. This was relayed to [place]

## 2020-10-29 NOTE — ED Notes (Signed)
Pt continues to complain of pain. Explained to patient again this nurse can't administer medication without an order. Pt verbalizes understanding.

## 2020-10-29 NOTE — ED Provider Notes (Addendum)
[facility] EMERGENCY DEPARTMENT Provider Note   CSN: [id number] Arrival date & time: 10/29/20  1012     History Chief Complaint  Patient presents with  . Fall    Jorge Sherman is a 79 y.o. male.  The history is provided by the patient and medical records. No language interpreter was used.  Fall     79 year old male with history of recurrent dizziness, diabetes, chronic pain, spinal stenosis, essential tremor brought here via EMS for evaluation of a fall.  Patient report last night around 1 AM he was getting up to use the bathroom.  He was trying to maneuver from the bed to his power wheelchair when he lost control, fell, struck his right shoulder and his right side of chest against the wheelchair as he fell.  He does not think he hit his head nor having any loss of consciousness.  He was having difficulty getting up and was calling out for help.  He was able to arouse someone else from the condo that he is currently living who called EMS and when they arrived, he initially did not want to go to the ER because he was afraid that he may have to wait for a long time.  However, after sent home for a few hours he noticed worsening pain to his shoulder and chest and thus EMS brought him here for further evaluation.  He also endorsed nausea and did vomit but attributed to his current pain.  He did endorse having some diarrhea that started this morning but denies any recent antibiotic use.  No complaints of abdominal pain headache or dizziness.  He is not on any blood thinner medication.  He lives at home by himself.  No recent sickness.  He did report having a mechanical fall 8 weeks ago when he slipped on wet ground and fell backwards striking his shoulders against the ground.  States that he broke both shoulders from the impact but no specific treatment was given.  He takes Tylenol at home for pain.  Past Medical History:  Diagnosis Date  . Chronic pain   . Depressive  disorder, not elsewhere classified   . Dizziness and giddiness   . Essential and other specified forms of tremor   . Headache(784.0)   . Leukocytosis, unspecified   . Obstructive sleep apnea (adult) (pediatric)   . Pain in joint, lower leg   . Septic joint of left knee joint ([place]) 04/22/2019  . Spinal stenosis, lumbar region, without neurogenic claudication   . Thoracic or lumbosacral neuritis or radiculitis, unspecified   . Type II or unspecified type diabetes mellitus without mention of complication, not stated as uncontrolled   . Unspecified cardiovascular disease   . Unspecified essential hypertension     Patient Active Problem List   Diagnosis Date Noted  . Ileus ([place])   . Declining functional status   . Goals of care, counseling/discussion   . Palliative care by specialist   . AKI (acute kidney injury) ([place]) 06/27/2020  . Acute diarrhea 06/27/2020  . Altered mental status 08/28/2019  . Septic joint of left knee joint ([place]) 04/22/2019  . Erectile dysfunction 10/30/2013  . Essential hypertension, benign 10/30/2013  . Coronary atherosclerosis of native coronary artery 10/30/2013  . Dizziness and giddiness   . Pain in joint, lower leg   . Leukocytosis, unspecified   . Type II or unspecified type diabetes mellitus without mention of complication, not stated as uncontrolled   . Obstructive sleep  apnea   . Depressive disorder, not elsewhere classified   . Essential and other specified forms of tremor   . Headache(784.0)   . Spinal stenosis, lumbar region, without neurogenic claudication   . Thoracic or lumbosacral neuritis or radiculitis, unspecified   . Chronic pain     Past Surgical History:  Procedure Laterality Date  . BACK SURGERY    . back surgey     times 3       Family History  Problem Relation Age of Onset  . Cancer Father   . Heart disease Father   . Stroke Mother   . Diabetes Mother     Social History   Tobacco Use  . Smoking status: Former Research scientist (life sciences)   . Smokeless tobacco: Never Used  Substance Use Topics  . Alcohol use: No  . Drug use: No    Home Medications Prior to Admission medications   Medication Sig Start Date End Date Taking? Authorizing Provider  acetaminophen (TYLENOL) 325 MG tablet Take 2 tablets (650 mg total) by mouth every 6 (six) hours as needed for mild pain (or Fever >/= 101). 07/04/20   Georgette Shell, MD  amLODipine (NORVASC) 2.5 MG tablet Take 2.5 mg by mouth daily.    [provider]  celecoxib (CELEBREX) 200 MG capsule Take 200 mg by mouth 2 (two) times daily.    [provider]  ciprofloxacin (CIPRO) 500 MG tablet Take 1 tablet (500 mg total) by mouth 2 (two) times daily. 07/04/20   Georgette Shell, MD  cloNIDine (CATAPRES) 0.1 MG tablet Take 0.1 mg by mouth 2 (two) times daily.     [provider]  DULoxetine (CYMBALTA) 30 MG capsule Take 30 mg by mouth 3 (three) times daily.     [provider]  ferrous sulfate 325 (65 FE) MG tablet Take 325 mg by mouth daily.     [provider]  furosemide (LASIX) 20 MG tablet Take 20 mg by mouth daily as needed. 01/20/20   [provider]  gabapentin (NEURONTIN) 800 MG tablet Take 800 mg by mouth 3 (three) times daily. 06/10/20   [provider]  hydroxychloroquine (PLAQUENIL) 200 MG tablet Take 200 mg by mouth 2 (two) times daily.    [provider]  ipratropium-albuterol (DUONEB) 0.5-2.5 (3) MG/3ML SOLN SMARTSIG:1 Vial(s) Via Nebulizer PRN 02/13/20   [provider]  Iron-FA-B Cmp-C-Biot-Probiotic (FUSION PLUS PO) Take 1 capsule by mouth daily.    [provider]  ketorolac (TORADOL) 10 MG tablet Take 10 mg by mouth every 6 (six) hours as needed. 06/15/20   [provider]  Lidocaine-Menthol 3.6-1.25 % PTCH Place onto the skin. 01/02/20   [provider]  linaclotide (LINZESS) 72 MCG capsule Take 72 mcg by mouth daily before breakfast.    [provider]   loperamide (IMODIUM A-D) 2 MG tablet Take 1 tablet (2 mg total) by mouth 4 (four) times daily as needed for diarrhea or loose stools. 07/04/20   Georgette Shell, MD  LORazepam (ATIVAN) 1 MG tablet Take 2 tablets (2 mg total) by mouth every 8 (eight) hours as needed for anxiety. 07/06/20   Georgette Shell, MD  methocarbamol (ROBAXIN) 500 MG tablet Take 1,000 mg by mouth 3 (three) times daily. 06/15/20   [provider]  NARCAN 4 MG/0.1ML LIQD nasal spray kit SMARTSIG:1 Spray(s) Both Nares 01/27/20   [provider]  omeprazole (PRILOSEC) 40 MG capsule Take 40 mg by mouth daily.  [provider]  ondansetron (ZOFRAN) 4 MG tablet Take 1 tablet (4 mg total) by mouth every 6 (six) hours as needed for nausea. 07/04/20   Georgette Shell, MD  Oxycodone HCl 20 MG TABS Take 1 tablet (20 mg total) by mouth every 6 (six) hours as needed. 07/06/20   Georgette Shell, MD  rosuvastatin (CRESTOR) 40 MG tablet Take 40 mg by mouth every evening.     [provider]  saccharomyces boulardii (FLORASTOR) 250 MG capsule Take 1 capsule (250 mg total) by mouth 2 (two) times daily. 07/04/20   Georgette Shell, MD  tamsulosin (FLOMAX) 0.4 MG CAPS capsule Take 0.4 mg by mouth daily.    [provider]    Allergies    Cefepime, Iohexol, Fentanyl, and Iodine  Review of Systems   Review of Systems  All other systems reviewed and are negative.   Physical Exam Updated Vital Signs BP (!) 141/86 (BP Location: Right Arm)   Pulse 93   Resp 15   SpO2 98%   Physical Exam Vitals and nursing note reviewed.  Constitutional:      General: He is not in acute distress.    Appearance: He is well-developed and well-nourished. He is obese.     Comments: Obese male appears mildly uncomfortable, with vomitus on his gown  HENT:     Head: Normocephalic and atraumatic.  Eyes:     Extraocular Movements: Extraocular movements intact.     Conjunctiva/sclera: Conjunctivae  normal.     Pupils: Pupils are equal, round, and reactive to light.  Cardiovascular:     Rate and Rhythm: Normal rate and regular rhythm.     Pulses: Normal pulses.     Heart sounds: Normal heart sounds.  Pulmonary:     Effort: Pulmonary effort is normal.     Breath sounds: Normal breath sounds. No wheezing, rhonchi or rales.  Chest:     Chest wall: Tenderness (Tenderness to right anterior lateral chest wall palpation without crepitus or emphysema or bruising noted.) present.  Abdominal:     Palpations: Abdomen is soft.     Tenderness: There is no abdominal tenderness.  Musculoskeletal:        General: Tenderness (Right shoulder: Tenderness throughout shoulder on palpation with decreased range of motion but no obvious deformity noted.) present.     Cervical back: Normal range of motion and neck supple.     Comments: Tenderness throughout entire posterior back without focal point tenderness  Skin:    Findings: No rash.  Neurological:     Mental Status: He is alert and oriented to person, place, and time.     Comments: Able to move all 4 extremities.  Psychiatric:        Mood and Affect: Mood and affect and mood normal.     ED Results / Procedures / Treatments   Labs (all labs ordered are listed, but only abnormal results are displayed) Labs Reviewed  COMPREHENSIVE METABOLIC PANEL - Abnormal; Notable for the following components:      Result Value   Sodium 130 (*)    Chloride 91 (*)    Glucose, Bld 101 (*)    All other components within normal limits  CBC WITH DIFFERENTIAL/PLATELET - Abnormal; Notable for the following components:   WBC 21.1 (*)    Platelets 443 (*)    Neutro Abs 16.4 (*)    Monocytes Absolute 2.0 (*)    Abs Immature Granulocytes 0.40 (*)  All other components within normal limits    EKG EKG Interpretation  Date/Time:  Thursday October 29 2020 10:24:11 EST Ventricular Rate:  105 PR Interval:    QRS Duration: 79 QT Interval:  317 QTC  Calculation: 419 R Axis:   -52 Text Interpretation: Sinus tachycardia Ventricular premature complex Aberrant conduction of SV complex(es) Right atrial enlargement Left anterior fascicular block Abnormal R-wave progression, late transition Baseline wander in lead(s) V5 Interpretation limited secondary to artifact Confirmed by Fredia Sorrow [phone]) on 10/29/2020 10:27:21 AM   Radiology DG Ribs Unilateral W/Chest Right  Result Date: 10/29/2020 CLINICAL DATA:  [place] yesterday, RIGHT shoulder and rib pain EXAM: RIGHT RIBS AND CHEST - 3+ VIEW COMPARISON:  Chest radiograph 06/27/2020 FINDINGS: Normal heart size, mediastinal contours, and pulmonary vascularity. Slight elevation RIGHT diaphragm unchanged. No infiltrate, pleural effusion or pneumothorax. Diffuse osseous demineralization. Degenerative changes RIGHT AC joint. Mild RIGHT glenohumeral degenerative changes. No rib fracture or bone destruction. IMPRESSION: No acute abnormalities. Degenerative changes at RIGHT glenohumeral and acromioclavicular joints. Electronically Signed   By: Lavonia Dana M.D.   On: 10/29/2020 11:22   DG Shoulder Right  Result Date: 10/29/2020 CLINICAL DATA:  Fall EXAM: RIGHT SHOULDER - 2+ VIEW COMPARISON:  None. FINDINGS: There is alignment at the glenohumeral joint. No acute fracture. Degenerative changes are present at the glenohumeral joint. High-riding humeral head likely reflects prior rotator cuff tear. There is chronic widening of the acromioclavicular joint with degenerative change. IMPRESSION: No acute fracture or malalignment. Electronically Signed   By: Macy Mis M.D.   On: 10/29/2020 11:21   CT Head Wo Contrast  Result Date: 10/29/2020 CLINICAL DATA:  Fall EXAM: CT HEAD WITHOUT CONTRAST TECHNIQUE: Contiguous axial images were obtained from the base of the skull through the vertex without intravenous contrast. COMPARISON:  October 2020 FINDINGS: Brain: There is no acute intracranial hemorrhage, mass effect, or  edema. Gray-white differentiation is preserved. There is no extra-axial fluid collection. Prominence of the ventricles and sulci reflects stable parenchymal volume loss. Patchy hypoattenuation in the supratentorial white matter is nonspecific but probably reflects stable chronic microvascular ischemic changes. Vascular: There is atherosclerotic calcification at the skull base. Skull: Calvarium is unremarkable. Sinuses/Orbits: Minor mucosal thickening. Bilateral lens replacements. Other: None. IMPRESSION: No evidence of acute intracranial injury. Electronically Signed   By: Macy Mis M.D.   On: 10/29/2020 11:33   CT Chest Wo Contrast  Result Date: 10/29/2020 CLINICAL DATA:  Right-sided rib pain after fall yesterday. EXAM: CT CHEST WITHOUT CONTRAST TECHNIQUE: Multidetector CT imaging of the chest was performed following the standard protocol without IV contrast. COMPARISON:  May 10, 2019. FINDINGS: Cardiovascular: Atherosclerosis of thoracic aorta is noted without aneurysm formation. Normal cardiac size. No pericardial effusion. Coronary artery calcifications are noted suggesting coronary artery disease. Mediastinum/Nodes: No enlarged mediastinal or axillary lymph nodes. Thyroid gland, trachea, and esophagus demonstrate no significant findings. Lungs/Pleura: No pneumothorax or pleural effusion is noted. Stable bibasilar and right upper lobe scarring is noted. No definite acute abnormality is noted. Upper Abdomen: Minimal cholelithiasis is noted. Musculoskeletal: No fracture is seen. IMPRESSION: 1. Coronary artery calcifications are noted suggesting coronary artery disease. 2. Stable bibasilar and right upper lobe scarring is noted. 3. Minimal cholelithiasis is noted. 4. Aortic atherosclerosis. Aortic Atherosclerosis (ICD10-I70.0). Electronically Signed   By: Marijo Conception M.D.   On: 10/29/2020 13:15    Procedures Procedures (including critical care time)  Medications Ordered in ED Medications   LORazepam (ATIVAN) tablet 1 mg (has no administration in  time range)  morphine 4 MG/ML injection 4 mg (4 mg Intravenous Given 10/29/20 1036)  ondansetron (ZOFRAN) injection 4 mg (4 mg Intravenous Given 10/29/20 1035)  HYDROmorphone (DILAUDID) injection 1 mg (1 mg Intravenous Given 10/29/20 1135)  oxyCODONE-acetaminophen (PERCOCET/ROXICET) 5-325 MG per tablet 1 tablet (1 tablet Oral Given 10/29/20 1419)  acetaminophen (TYLENOL) tablet 650 mg (650 mg Oral Given 10/29/20 1643)    ED Course  I have reviewed the triage vital signs and the nursing notes.  Pertinent labs & imaging results that were available during my care of the patient were reviewed by me and considered in my medical decision making (see chart for details).    MDM Rules/Calculators/A&P                          BP 133/83 (BP Location: Right Arm)   Pulse 92   Resp 20   SpO2 97%   Final Clinical Impression(s) / ED Diagnoses Final diagnoses:  Fall in home, initial encounter  Right-sided chest wall pain    Rx / DC Orders ED Discharge Orders    None     10:41 AM Patient with a mechanical fall this morning when he was trying to get on his power chair from his bed to use the bathroom but lost control and fell.  Pain primarily to his right shoulder and right chest wall from the impact.  Will obtain appropriate imaging.  He did felt nauseous and vomited but attributed to being in pain.  He does not think he struck his head or any loss of consciousness but I plan to obtain a head CT scan to rule out intracranial injury.  Pain medication and antinausea medication given.  Will check labs.  Care discussed with DR. Rogene Houston.   11:56 AM Head CT scan unremarkable, x-ray of the right shoulder and right ribs without any acute finding however despite receiving morphine and Dilaudid he still endorse significant pain to the right side of his chest. Will obtain chest CT to rule out occult fracture.  1:48 PM Chest CT scan obtained showing  no acute finding.  Minimal cholelithiasis were noted.  No fracture seen.  2:08 PM Attempt to contact patient's son over the phone without any success.  At this patient is stable for discharge.  I have reviewed prior notes and it appears patient has bilateral shoulder arthritis but no report of any scapular fracture or shoulder fractures as he previously mentioned.  CT scan today obviously did not show any fractures.  I do not think prescribing opiate medication is appropriate as it increase the risk of drowsiness and increase the risk of fall especially in the setting of no acute finding.  I encourage patient to follow-up with his doctor for further care.  Pt did report he used to have a tough time coming off from long term opiate use.  [facility] did offer suboxone in the past but he declined.  I have reviewed his CT scan, no obvious spinal fx or scapular fx noted.  No ribs fx noted.  I hope pt can f/u with PCP.  He may return if sxs worsen.  Pt did report taking ativan twice daily for panic attack and requesting for ativan.  One 30m of ativan given.    6:34 PM Patient is now complaining of regular pain to his right leg similar to prior sciatica.  He still continues to endorse pain in his chest from the impact of  the fall.  I do not think this is ACS related.  However given his age and multiple dose of pain medication without adequate improvement of his symptoms, will consult medicine for admission for pain control.  6:53 PM Appreciate consultation to [facility] Dr. Cyd Silence who agrees to see and admit pt.  Given finding of hemoconcentrated blood, along with pt's comorbidity, he recommend extending work up to include cardiac workup, as well as check UA and start IVF.  All of which are reasonable in the setting of an elderly gentlemen with multiple comorbidities.     Domenic Moras, PA-C 10/29/20 Randol Kern    Fredia Sorrow, MD 11/28/20 [phone]

## 2020-10-29 NOTE — ED Notes (Addendum)
Patient returned from CT

## 2020-10-29 NOTE — ED Notes (Signed)
Pt back from Xray and CT, requesting more pain medication. PA aware.

## 2020-10-29 NOTE — ED Notes (Signed)
Patient transported to CT via stretcher in stable condition 

## 2020-10-29 NOTE — ED Notes (Signed)
[facility] called/cancel @[id number] -per Camillia Herter, RN called by Levada Dy

## 2020-10-29 NOTE — ED Notes (Signed)
Placed a external cath patient is resting with call bell in reach 

## 2020-10-29 NOTE — H&P (Signed)
History and Physical    ANUP BRIGHAM IRC:[id number] DOB: 1941-10-14 DOA: 10/29/2020  PCP: Merrilee Seashore, MD  Patient coming from: Home   Chief Complaint:  Chief Complaint  Patient presents with  . Fall     HPI:    79 year old male with past medical history of hypertension, chronic pain syndrome, hyperlipidemia, BPH, depression, diabetes mellitus type 2 (diet controlled), coronary artery disease (cath with BMS placement in 2009), OSA (not compliant with CPAP), iron deficiency anemia, rheumatoid arthritis who presents to [facility] emergency department with complaints of severe right chest wall and flank pain after a fall.  Patient explains that this morning, he was attempting to transfer from his bed to his electric wheelchair when he lost his grip and fell, striking his right lateral chest on the wheelchair.  Patient then fell to the ground and stated that he lied there for 1 hour due to pain and inability to get up.  Of note, patient is minimally ambulatory at baseline due to significant debility related to his diffuse joint disease and chronic pain syndrome.  Patient states that in the past several weeks, his pain has been particularly severe leading to him having very poor oral intake with both solids and liquids.  Patient denies fevers, cough, dysuria, diarrhea, recent travel, sick contacts or contact with confirmed COVID-19 infection.  Per my discussion with the patient's caregiver via phone conversation, it turns out patient has fallen upwards of 5 times in the past 2 weeks, much more frequently than usual. (Patient disputes this however.) The caregiver is unable to report any other particular symptoms out of the ordinary.  EMS was eventually contacted and the patient was brought into [facility] emergency department for evaluation.  Upon evaluation in the emergency department, patient was found to be hyponatremic with sodium of 130 with marked leukocytosis  of 21.1 and marked elevation of all other cell lines and CBC concerning for severe hemoconcentration and dehydration.  Patient was found to be complaining of severe right chest wall pain, felt to be musculoskeletal with a series of x-rays revealing no evidence of fracture.  Due to patient's suspected dehydration with electrolyte abnormalities and ongoing intractable pain the hospitalist group has been called to assess the patient for admission to the hospital  Review of Systems:   Review of Systems  Musculoskeletal: Positive for back pain, falls, joint pain and myalgias.  Neurological: Positive for weakness.  All other systems reviewed and are negative.   Past Medical History:  Diagnosis Date  . Chronic pain   . Depressive disorder, not elsewhere classified   . Dizziness and giddiness   . Essential and other specified forms of tremor   . Headache(784.0)   . Leukocytosis, unspecified   . Obstructive sleep apnea (adult) (pediatric)   . Pain in joint, lower leg   . Septic joint of left knee joint ([place]) 04/22/2019  . Spinal stenosis, lumbar region, without neurogenic claudication   . Thoracic or lumbosacral neuritis or radiculitis, unspecified   . Type II or unspecified type diabetes mellitus without mention of complication, not stated as uncontrolled   . Unspecified cardiovascular disease   . Unspecified essential hypertension     Past Surgical History:  Procedure Laterality Date  . BACK SURGERY    . back surgey     times 3     reports that he has quit smoking. He has never used smokeless tobacco. He reports that he does not drink alcohol and does not  use drugs.  Allergies  Allergen Reactions  . Cefepime Rash    Other reaction(s): Other (See Comments), redness  . Iohexol Hives and Other (See Comments)    A few hives post CT Other reaction(s): hives, few, post CT  . Fentanyl Itching, Nausea And Vomiting and Other (See Comments)    Patient states that it happened over 30 yrs ago   . Iodine Rash    Other reaction(s): Hives/Skin Rash    Family History  Problem Relation Age of Onset  . Cancer Father   . Heart disease Father   . Stroke Mother   . Diabetes Mother      Prior to Admission medications   Medication Sig Start Date End Date Taking? Authorizing Provider  acetaminophen (TYLENOL) 325 MG tablet Take 2 tablets (650 mg total) by mouth every 6 (six) hours as needed for mild pain (or Fever >/= 101). 07/04/20   Georgette Shell, MD  amLODipine (NORVASC) 2.5 MG tablet Take 2.5 mg by mouth daily.    [provider]  celecoxib (CELEBREX) 200 MG capsule Take 200 mg by mouth 2 (two) times daily.    [provider]  ciprofloxacin (CIPRO) 500 MG tablet Take 1 tablet (500 mg total) by mouth 2 (two) times daily. 07/04/20   Georgette Shell, MD  cloNIDine (CATAPRES) 0.1 MG tablet Take 0.1 mg by mouth 2 (two) times daily.     [provider]  DULoxetine (CYMBALTA) 30 MG capsule Take 30 mg by mouth 3 (three) times daily.     [provider]  ferrous sulfate 325 (65 FE) MG tablet Take 325 mg by mouth daily.     [provider]  furosemide (LASIX) 20 MG tablet Take 20 mg by mouth daily as needed. 01/20/20   [provider]  gabapentin (NEURONTIN) 800 MG tablet Take 800 mg by mouth 3 (three) times daily. 06/10/20   [provider]  hydroxychloroquine (PLAQUENIL) 200 MG tablet Take 200 mg by mouth 2 (two) times daily.    [provider]  ipratropium-albuterol (DUONEB) 0.5-2.5 (3) MG/3ML SOLN SMARTSIG:1 Vial(s) Via Nebulizer PRN 02/13/20   [provider]  Iron-FA-B Cmp-C-Biot-Probiotic (FUSION PLUS PO) Take 1 capsule by mouth daily.    [provider]  ketorolac (TORADOL) 10 MG tablet Take 10 mg by mouth every 6 (six) hours as needed. 06/15/20   [provider]  Lidocaine-Menthol 3.6-1.25 % PTCH Place onto the skin. 01/02/20   [provider]  linaclotide (LINZESS) 72 MCG  capsule Take 72 mcg by mouth daily before breakfast.    [provider]  loperamide (IMODIUM A-D) 2 MG tablet Take 1 tablet (2 mg total) by mouth 4 (four) times daily as needed for diarrhea or loose stools. 07/04/20   Georgette Shell, MD  LORazepam (ATIVAN) 1 MG tablet Take 2 tablets (2 mg total) by mouth every 8 (eight) hours as needed for anxiety. 07/06/20   Georgette Shell, MD  methocarbamol (ROBAXIN) 500 MG tablet Take 1,000 mg by mouth 3 (three) times daily. 06/15/20   [provider]  NARCAN 4 MG/0.1ML LIQD nasal spray kit SMARTSIG:1 Spray(s) Both Nares 01/27/20   [provider]  omeprazole (PRILOSEC) 40 MG capsule Take 40 mg by mouth daily.    [provider]  ondansetron (ZOFRAN) 4 MG tablet Take 1 tablet (4 mg total) by mouth every 6 (six) hours as needed for nausea. 07/04/20   Georgette Shell, MD  Oxycodone HCl 20  MG TABS Take 1 tablet (20 mg total) by mouth every 6 (six) hours as needed. 07/06/20   Georgette Shell, MD  rosuvastatin (CRESTOR) 40 MG tablet Take 40 mg by mouth every evening.     [provider]  saccharomyces boulardii (FLORASTOR) 250 MG capsule Take 1 capsule (250 mg total) by mouth 2 (two) times daily. 07/04/20   Georgette Shell, MD  tamsulosin (FLOMAX) 0.4 MG CAPS capsule Take 0.4 mg by mouth daily.    [provider]    Physical Exam: Vitals:   10/29/20 1200 10/29/20 1318 10/29/20 1400 10/29/20 1840  BP: (!) 149/82 133/83 130/74 (!) 142/92  Pulse: 84 92 92 89  Resp: (!) _0 Temp:  98.7 F (37.1 C)    TempSrc:  Oral    SpO2: 95% 97% 95% 96%    Constitutional: Acute alert and oriented x3, patient is visibly in distress due to pain. Skin: no rashes, no lesions, poor skin turgor noted. Eyes: Pupils are equally reactive to light.  No evidence of scleral icterus or conjunctival pallor.  ENMT: Dry mucous membranes noted.  Posterior pharynx clear of any exudate or lesions.   Neck:  normal, supple, no masses, no thyromegaly.  No evidence of jugular venous distension.   Respiratory: clear to auscultation bilaterally, no wheezing, no crackles. Normal respiratory effort. No accessory muscle use.  Cardiovascular: Regular rate and rhythm, no murmurs / rubs / gallops. No extremity edema. 2+ pedal pulses. No carotid bruits.  Chest:   Generalized chest wall tenderness, particularly on the right side without evidence of crepitus or deformity Back:   Notable tenderness of the thoracic and lumbar spine without crepitus or deformity. Abdomen: Abdomen is soft with diffuse tenderness.  No evidence of intra-abdominal masses.  Positive bowel sounds noted in all quadrants.   Musculoskeletal: Notable diffuse tenderness of virtually of her extremity.  Notable pain with both passive and active range of motion particularly of the bilateral shoulders and bilateral hips.  No contractures. Normal muscle tone.  Neurologic: CN 2-12 grossly intact. Sensation intact.  Patient moving all 4 extremities spontaneously.  Patient is following all commands.  Patient is responsive to verbal stimuli.   Psychiatric: Patient exhibits anxious mood with appropriate affect.  Patient seems to possess insight as to their current situation.     Labs on Admission: I have personally reviewed following labs and imaging studies -   CBC: Recent Labs  Lab 10/29/20 1054  WBC 21.1*  NEUTROABS 16.4*  HGB 13.2  HCT 40.7  MCV 91.9  PLT 161*   Basic Metabolic Panel: Recent Labs  Lab 10/29/20 1054  NA 130*  K 4.4  CL 91*  CO2 26  GLUCOSE 101*  BUN 14  CREATININE 0.79  CALCIUM 9.7   GFR: CrCl cannot be calculated (Unknown ideal weight.). Liver Function Tests: Recent Labs  Lab 10/29/20 1054  AST 26  ALT 22  ALKPHOS 62  BILITOT 0.5  PROT 7.0  ALBUMIN 4.1   No results for input(s): LIPASE, AMYLASE in the last 168 hours. No results for input(s): AMMONIA in the last 168 hours. Coagulation Profile: No  results for input(s): INR, PROTIME in the last 168 hours. Cardiac Enzymes: No results for input(s): CKTOTAL, CKMB, CKMBINDEX, TROPONINI in the last 168 hours. BNP (last 3 results) No results for input(s): PROBNP in the last 8760 hours. HbA1C: No results for input(s): HGBA1C in the last 72 hours. CBG: No results for input(s): GLUCAP in the  last 168 hours. Lipid Profile: No results for input(s): CHOL, HDL, LDLCALC, TRIG, CHOLHDL, LDLDIRECT in the last 72 hours. Thyroid Function Tests: No results for input(s): TSH, T4TOTAL, FREET4, T3FREE, THYROIDAB in the last 72 hours. Anemia Panel: No results for input(s): VITAMINB12, FOLATE, FERRITIN, TIBC, IRON, RETICCTPCT in the last 72 hours. Urine analysis:    Component Value Date/Time   COLORURINE YELLOW 07/03/2020 0300   APPEARANCEUR CLEAR 07/03/2020 0300   LABSPEC 1.008 07/03/2020 0300   PHURINE 6.0 07/03/2020 0300   GLUCOSEU NEGATIVE 07/03/2020 0300   HGBUR SMALL (A) 07/03/2020 0300   BILIRUBINUR NEGATIVE 07/03/2020 0300   KETONESUR NEGATIVE 07/03/2020 0300   PROTEINUR NEGATIVE 07/03/2020 0300   NITRITE POSITIVE (A) 07/03/2020 0300   LEUKOCYTESUR LARGE (A) 07/03/2020 0300    Radiological Exams on Admission - Personally Reviewed: DG Ribs Unilateral W/Chest Right  Result Date: 10/29/2020 CLINICAL DATA:  [place] yesterday, RIGHT shoulder and rib pain EXAM: RIGHT RIBS AND CHEST - 3+ VIEW COMPARISON:  Chest radiograph 06/27/2020 FINDINGS: Normal heart size, mediastinal contours, and pulmonary vascularity. Slight elevation RIGHT diaphragm unchanged. No infiltrate, pleural effusion or pneumothorax. Diffuse osseous demineralization. Degenerative changes RIGHT AC joint. Mild RIGHT glenohumeral degenerative changes. No rib fracture or bone destruction. IMPRESSION: No acute abnormalities. Degenerative changes at RIGHT glenohumeral and acromioclavicular joints. Electronically Signed   By: Lavonia Dana M.D.   On: 10/29/2020 11:22   DG Shoulder  Right  Result Date: 10/29/2020 CLINICAL DATA:  Fall EXAM: RIGHT SHOULDER - 2+ VIEW COMPARISON:  None. FINDINGS: There is alignment at the glenohumeral joint. No acute fracture. Degenerative changes are present at the glenohumeral joint. High-riding humeral head likely reflects prior rotator cuff tear. There is chronic widening of the acromioclavicular joint with degenerative change. IMPRESSION: No acute fracture or malalignment. Electronically Signed   By: Macy Mis M.D.   On: 10/29/2020 11:21   CT Head Wo Contrast  Result Date: 10/29/2020 CLINICAL DATA:  Fall EXAM: CT HEAD WITHOUT CONTRAST TECHNIQUE: Contiguous axial images were obtained from the base of the skull through the vertex without intravenous contrast. COMPARISON:  October 2020 FINDINGS: Brain: There is no acute intracranial hemorrhage, mass effect, or edema. Gray-white differentiation is preserved. There is no extra-axial fluid collection. Prominence of the ventricles and sulci reflects stable parenchymal volume loss. Patchy hypoattenuation in the supratentorial white matter is nonspecific but probably reflects stable chronic microvascular ischemic changes. Vascular: There is atherosclerotic calcification at the skull base. Skull: Calvarium is unremarkable. Sinuses/Orbits: Minor mucosal thickening. Bilateral lens replacements. Other: None. IMPRESSION: No evidence of acute intracranial injury. Electronically Signed   By: Macy Mis M.D.   On: 10/29/2020 11:33   CT Chest Wo Contrast  Result Date: 10/29/2020 CLINICAL DATA:  Right-sided rib pain after fall yesterday. EXAM: CT CHEST WITHOUT CONTRAST TECHNIQUE: Multidetector CT imaging of the chest was performed following the standard protocol without IV contrast. COMPARISON:  May 10, 2019. FINDINGS: Cardiovascular: Atherosclerosis of thoracic aorta is noted without aneurysm formation. Normal cardiac size. No pericardial effusion. Coronary artery calcifications are noted suggesting coronary  artery disease. Mediastinum/Nodes: No enlarged mediastinal or axillary lymph nodes. Thyroid gland, trachea, and esophagus demonstrate no significant findings. Lungs/Pleura: No pneumothorax or pleural effusion is noted. Stable bibasilar and right upper lobe scarring is noted. No definite acute abnormality is noted. Upper Abdomen: Minimal cholelithiasis is noted. Musculoskeletal: No fracture is seen. IMPRESSION: 1. Coronary artery calcifications are noted suggesting coronary artery disease. 2. Stable bibasilar and right upper lobe scarring is noted. 3. Minimal  cholelithiasis is noted. 4. Aortic atherosclerosis. Aortic Atherosclerosis (ICD10-I70.0). Electronically Signed   By: Marijo Conception M.D.   On: 10/29/2020 13:15    EKG: Personally reviewed.  Rhythm is normal sinus rhythm with heart rate of 89 bpm.  No dynamic ST segment changes appreciated.  Assessment/Plan Principal Problem:   Dehydration with hyponatremia   Patient exhibiting poor skin turgor and dry mucous membranes with reported poor oral intake in the past several weeks.  CBC reveals revealed substantial hemoconcentration with notable concurrent hyponatremia all concerning for dehydration/hypovolemia  Hydrating patient with intravenous isotonic fluids  Encouraging oral intake  Monitoring renal function and electrolytes with serial chemistries  Evaluating patient for any underlying evidence of infection via chest imaging, blood cultures, urinalysis and urine culture  Active Problems:   Fall at home, initial encounter   Patient reports a fall earlier today, striking his right chest on his electric wheelchair  Caregiver reports several more falls in the past 2 weeks although patient is disputing this account.  Notable substantial reproducible chest pain on examination however granted patient is tender virtually everywhere on exam  X-rays reveal no evidence of acute fracture  Performing work-up for any evidence of underlying  infection particularly due to leukocytosis  Providing patient with as needed opiate-based analgesics for associated substantial pain  Obtaining PT evaluation in the morning    Musculoskeletal chest pain   Patient's chest discomfort is felt to be musculoskeletal secondary to fall  EKG reveals no dynamic ST segment changes  Troponin pending although even if this is elevated I clinically doubt the patient is suffering from ACS  Monitoring patient on telemetry  Providing patient with as needed opiate-based analgesics for substantial pain associated with fall    Type 2 diabetes mellitus without complication, with long-term current use of insulin (HCC)   Seemingly diet controlled in the outpatient setting  Accu-Cheks before every meal and nightly with sliding scale insulin    Chronic pain syndrome   Longstanding chronic pain syndrome, multifactorial secondary to history of rheumatoid arthritis, history of multiple musculoskeletal injuries (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  Provided patient with as needed opiates for now during this hospitalization due to substantial pain status post fall  Patient may need pain clinic follow-up at discharge    Essential hypertension, benign   Continue home regimen of antihypertensive therapy    Coronary artery disease involving native coronary artery of native heart without angina pectoris   While patient is complaining of chest discomfort, I feel that this is more musculoskeletal  Monitoring patient on telemetry  Continuing statin therapy  I do not see antiplatelet therapy on patient's home medication list, will place on aspirin for now    Mixed hyperlipidemia due to type 2 diabetes mellitus ([place])   Continue statin therapy    Benign prostatic hyperplasia  without lower urinary tract symptoms    Continue Flomax   Code Status:  DNR Family Communication: Deferred  Status is: Observation  The patient remains OBS appropriate and will d/c before 2 midnights.  Dispo: The patient is from: Home              Anticipated d/c is to: SNF              Anticipated d/c date is: 2 days  Patient currently is not medically stable to d/c.        Vernelle Emerald MD [facility] Pager [phone]  If 7PM-7AM, please contact night-coverage [email or web address] Use universal [place] password for that web site. If you do not have the password, please call the hospital operator.  10/29/2020, 8:16 PM

## 2020-10-29 NOTE — ED Notes (Signed)
Patient transported to Xray in stable condition.

## 2020-10-29 NOTE — ED Notes (Signed)
Pt states they feel confident preserving their safety if sent home d/t precense of Home health providers. Extensive conversations had with patient regarding care objectives s/p discharge.

## 2020-10-29 NOTE — ED Notes (Signed)
Pt actively vomiting. PA at bedside

## 2020-10-30 DIAGNOSIS — E1169 Type 2 diabetes mellitus with other specified complication: Secondary | ICD-10-CM | POA: Diagnosis present

## 2020-10-30 DIAGNOSIS — W06XXXA Fall from bed, initial encounter: Secondary | ICD-10-CM | POA: Diagnosis present

## 2020-10-30 DIAGNOSIS — Z87891 Personal history of nicotine dependence: Secondary | ICD-10-CM | POA: Diagnosis not present

## 2020-10-30 DIAGNOSIS — R0789 Other chest pain: Secondary | ICD-10-CM | POA: Diagnosis present

## 2020-10-30 DIAGNOSIS — M19011 Primary osteoarthritis, right shoulder: Secondary | ICD-10-CM | POA: Diagnosis present

## 2020-10-30 DIAGNOSIS — Y92009 Unspecified place in unspecified non-institutional (private) residence as the place of occurrence of the external cause: Secondary | ICD-10-CM | POA: Diagnosis not present

## 2020-10-30 DIAGNOSIS — R296 Repeated falls: Secondary | ICD-10-CM | POA: Diagnosis present

## 2020-10-30 DIAGNOSIS — M069 Rheumatoid arthritis, unspecified: Secondary | ICD-10-CM | POA: Diagnosis present

## 2020-10-30 DIAGNOSIS — W19XXXA Unspecified fall, initial encounter: Secondary | ICD-10-CM | POA: Diagnosis not present

## 2020-10-30 DIAGNOSIS — F32A Depression, unspecified: Secondary | ICD-10-CM | POA: Diagnosis present

## 2020-10-30 DIAGNOSIS — E782 Mixed hyperlipidemia: Secondary | ICD-10-CM | POA: Diagnosis present

## 2020-10-30 DIAGNOSIS — F410 Panic disorder [episodic paroxysmal anxiety] without agoraphobia: Secondary | ICD-10-CM | POA: Diagnosis present

## 2020-10-30 DIAGNOSIS — Z823 Family history of stroke: Secondary | ICD-10-CM | POA: Diagnosis not present

## 2020-10-30 DIAGNOSIS — G250 Essential tremor: Secondary | ICD-10-CM | POA: Diagnosis present

## 2020-10-30 DIAGNOSIS — I2510 Atherosclerotic heart disease of native coronary artery without angina pectoris: Secondary | ICD-10-CM | POA: Diagnosis present

## 2020-10-30 DIAGNOSIS — Z20822 Contact with and (suspected) exposure to covid-19: Secondary | ICD-10-CM | POA: Diagnosis present

## 2020-10-30 DIAGNOSIS — Z91041 Radiographic dye allergy status: Secondary | ICD-10-CM | POA: Diagnosis not present

## 2020-10-30 DIAGNOSIS — E871 Hypo-osmolality and hyponatremia: Secondary | ICD-10-CM | POA: Diagnosis present

## 2020-10-30 DIAGNOSIS — N400 Enlarged prostate without lower urinary tract symptoms: Secondary | ICD-10-CM | POA: Diagnosis present

## 2020-10-30 DIAGNOSIS — M19012 Primary osteoarthritis, left shoulder: Secondary | ICD-10-CM | POA: Diagnosis present

## 2020-10-30 DIAGNOSIS — E860 Dehydration: Secondary | ICD-10-CM | POA: Diagnosis present

## 2020-10-30 DIAGNOSIS — Z888 Allergy status to other drugs, medicaments and biological substances status: Secondary | ICD-10-CM | POA: Diagnosis not present

## 2020-10-30 DIAGNOSIS — G4733 Obstructive sleep apnea (adult) (pediatric): Secondary | ICD-10-CM | POA: Diagnosis present

## 2020-10-30 DIAGNOSIS — I10 Essential (primary) hypertension: Secondary | ICD-10-CM | POA: Diagnosis present

## 2020-10-30 DIAGNOSIS — Z9119 Patient's noncompliance with other medical treatment and regimen: Secondary | ICD-10-CM | POA: Diagnosis not present

## 2020-10-30 DIAGNOSIS — Z66 Do not resuscitate: Secondary | ICD-10-CM | POA: Diagnosis present

## 2020-10-30 DIAGNOSIS — G894 Chronic pain syndrome: Secondary | ICD-10-CM | POA: Diagnosis present

## 2020-10-30 LAB — COMPREHENSIVE METABOLIC PANEL
ALT: 17 U/L (ref 0–44)
AST: 20 U/L (ref 15–41)
Albumin: 3.3 g/dL — ABNORMAL LOW (ref 3.5–5.0)
Alkaline Phosphatase: 53 U/L (ref 38–126)
Anion gap: 9 (ref 5–15)
BUN: 17 mg/dL (ref 8–23)
CO2: 25 mmol/L (ref 22–32)
Calcium: 8.7 mg/dL — ABNORMAL LOW (ref 8.9–10.3)
Chloride: 95 mmol/L — ABNORMAL LOW (ref 98–111)
Creatinine, Ser: 1.01 mg/dL (ref 0.61–1.24)
GFR, Estimated: >60 mL/min (ref >60)
Glucose, Bld: 101 mg/dL — ABNORMAL HIGH (ref 70–99)
Potassium: 4.4 mmol/L (ref 3.5–5.1)
Sodium: 129 mmol/L — ABNORMAL LOW (ref 135–145)
Total Bilirubin: 0.6 mg/dL (ref 0.3–1.2)
Total Protein: 5.7 g/dL — ABNORMAL LOW (ref 6.5–8.1)

## 2020-10-30 LAB — CBC WITH DIFFERENTIAL/PLATELET
Abs Immature Granulocytes: 0.18 10*3/uL — ABNORMAL HIGH (ref 0.00–0.07)
Basophils Absolute: 0.1 10*3/uL (ref 0.0–0.1)
Basophils Relative: 0 %
Eosinophils Absolute: 0.1 10*3/uL (ref 0.0–0.5)
Eosinophils Relative: 0 %
HCT: 35.3 % — ABNORMAL LOW (ref 39.0–52.0)
Hemoglobin: 11.5 g/dL — ABNORMAL LOW (ref 13.0–17.0)
Immature Granulocytes: 1 %
Lymphocytes Relative: 14 %
Lymphs Abs: 2.1 10*3/uL (ref 0.7–4.0)
MCH: 29.9 pg (ref 26.0–34.0)
MCHC: 32.6 g/dL (ref 30.0–36.0)
MCV: 91.7 fL (ref 80.0–100.0)
Monocytes Absolute: 1.5 10*3/uL — ABNORMAL HIGH (ref 0.1–1.0)
Monocytes Relative: 10 %
Neutro Abs: 11.3 10*3/uL — ABNORMAL HIGH (ref 1.7–7.7)
Neutrophils Relative %: 75 %
Platelets: 292 10*3/uL (ref 150–400)
RBC: 3.85 MIL/uL — ABNORMAL LOW (ref 4.22–5.81)
RDW: 13.8 % (ref 11.5–15.5)
WBC: 15.2 10*3/uL — ABNORMAL HIGH (ref 4.0–10.5)
nRBC: 0 % (ref 0.0–0.2)

## 2020-10-30 LAB — OSMOLALITY, URINE: Osmolality, Ur: 421 mOsm/kg (ref 300–900)

## 2020-10-30 LAB — GLUCOSE, CAPILLARY
Glucose-Capillary: 126 mg/dL — ABNORMAL HIGH (ref 70–99)
Glucose-Capillary: 130 mg/dL — ABNORMAL HIGH (ref 70–99)
Glucose-Capillary: 110 mg/dL — ABNORMAL HIGH (ref 70–99)
Glucose-Capillary: 145 mg/dL — ABNORMAL HIGH (ref 70–99)

## 2020-10-30 LAB — MAGNESIUM: Magnesium: 2 mg/dL (ref 1.7–2.4)

## 2020-10-30 LAB — CREATININE, URINE, RANDOM: Creatinine, Urine: 86.45 mg/dL

## 2020-10-30 LAB — SODIUM, URINE, RANDOM: Sodium, Ur: 45 mmol/L

## 2020-10-30 MED ORDER — ACETAMINOPHEN 500 MG PO TABS
1000.0000 mg | ORAL_TABLET | Freq: Once | ORAL | Status: AC
  Administered 2020-10-30: 1000 mg via ORAL
  Filled 2020-10-30: qty 2

## 2020-10-30 MED ORDER — HYDROMORPHONE HCL 1 MG/ML IJ SOLN
1.0000 mg | Freq: Once | INTRAMUSCULAR | Status: AC
  Administered 2020-10-30: 1 mg via INTRAVENOUS
  Filled 2020-10-30 (×2): qty 1

## 2020-10-30 MED ORDER — KETOROLAC TROMETHAMINE 30 MG/ML IJ SOLN
15.0000 mg | Freq: Three times a day (TID) | INTRAMUSCULAR | Status: AC | PRN
Start: 2020-10-30 — End: 2020-10-31
  Administered 2020-10-30 – 2020-10-31 (×2): 15 mg via INTRAVENOUS
  Filled 2020-10-30 (×2): qty 1

## 2020-10-30 MED ORDER — OXYCODONE-ACETAMINOPHEN 5-325 MG PO TABS
1.0000 | ORAL_TABLET | Freq: Four times a day (QID) | ORAL | Status: DC | PRN
Start: 2020-10-30 — End: 2020-11-03
  Administered 2020-10-30 – 2020-11-03 (×10): 2 via ORAL
  Filled 2020-10-30 (×10): qty 2

## 2020-10-30 MED ORDER — LIDOCAINE 5 % EX PTCH
1.0000 | MEDICATED_PATCH | CUTANEOUS | Status: DC
  Administered 2020-10-30 – 2020-11-03 (×5): 1 via TRANSDERMAL
  Filled 2020-10-30 (×5): qty 1

## 2020-10-30 MED ORDER — SODIUM CHLORIDE 0.9 % IV SOLN: INTRAVENOUS | Status: AC

## 2020-10-30 NOTE — Progress Notes (Signed)
Pt refused all labs- Notified MD Pokhrel.   Paulla Fore, RN, BSN

## 2020-10-30 NOTE — Plan of Care (Signed)
  Problem: Pain Managment: Goal: General experience of comfort will improve Outcome: Progressing   

## 2020-10-30 NOTE — Plan of Care (Signed)

## 2020-10-30 NOTE — Progress Notes (Signed)
PROGRESS NOTE  Jorge Sherman GXQ:[id number] DOB: 1941-03-28 DOA: 10/29/2020 PCP: Merrilee Seashore, MD   LOS: 0 days   Brief narrative: As per HPI,  79 year old male with past medical history of hypertension, chronic pain syndrome, hyperlipidemia, BPH, depression, diabetes mellitus type 2 (diet controlled), coronary artery disease (cath with BMS placement in 2009), OSA (not compliant with CPAP), iron deficiency anemia, rheumatoid arthritis who presented to the hospital with complaint of severe right chest wall and flank plain status post fall while attempting to transfer from his bed to his electric wheelchair.Patient was on the ground for almost 1 hour since he was unable to get up.  He does help baseline diffuse joint issues and minimal ambulatory dysfunction.  He also complained of worsening pain over his joints for the last several weeks.  It was also history of falls for at least 4-5 times over the last 2 weeks or so.  Patient was then brought into the hospital. In the ED,  patient was found to be hyponatremic with sodium of 130 with marked leukocytosis of 21.1, severe hemoconcentration and dehydration.  Chest x-ray did not show any evidence of fracture. Due to patient's suspected volume depletion with electrolyte abnormalities, recurrent falls and ongoing intractable pain patient was considered for admission to the hospital.  Assessment/Plan:  Principal Problem:   Dehydration with hyponatremia Active Problems:   Type 2 diabetes mellitus without complication, with long-term current use of insulin (HCC)   Chronic pain syndrome   Essential hypertension, benign   Coronary artery disease involving native coronary artery of native heart without angina pectoris   Fall at home, initial encounter   Musculoskeletal chest pain   Mixed hyperlipidemia due to type 2 diabetes mellitus ([place])   Benign prostatic hyperplasia without lower urinary tract symptoms  Dehydration with hyponatremia Poor  oral intake.  Hemoconcentration noted on admission.  Was on Ringer lactate.  Sodium has slightly gone down today.  Will change to normal saline for now.  Check serum osmolality, urine osmolality, urinary sodium, urinary creatinine.  TSH was within normal limits.  Check serum cortisol.  Closely monitor BMP.  Recurrent falls at home. Preceded by poor oral intake and hyponatremia.  X-ray without any fracture.  Will get physical therapy evaluation.   Might need rehabilitation.  Intractable chest wall pain secondary to fall.  EKG unremarkable.  Troponin pending.  Likely muscular.  Continue supportive care. Give 1 dose of IV Dilaudid today. Continue oxycodone. On Tylenol and NSAIDs as well. Added Lidoderm patch to the chest wall. CT chest did not show any acute issues.     Type 2 diabetes mellitus  Diet controlled at home.  Continue diabetic diet, sliding scale insulin while in hospital.    Chronic pain syndrome. Secondary to rheumatoid arthritis.  Not on any narcotics at this time.  Used to be on Suboxone and oxycodone in the past.  Currently on as needed narcotics due to severe pain. Complains of severe pain at this time.    Essential hypertension, benign Continue clonidine, amlodipine, tamsulosin.  Closely monitor blood pressure. Blood pressure seems to be stable at this time    Coronary artery disease  Continue statins.  Has been started on aspirin.    Was not on aspirin in the home medication list.    Mixed hyperlipidemia  Continue statin therapy   Benign prostatic hyperplasia without lower urinary tract symptoms  Continue Flomax   DVT prophylaxis: enoxaparin (LOVENOX) injection 40 mg Start: 10/29/20 2200   Code Status:  DNR  Family Communication: I spoke with the patient's daughter Ms [name] and updated her about the clinical condition of the patient. The family wishing to move to ALF at [facility]. Lives by himself and has caretakers which is not adequate as per the daughter.    Status is: Observation  The patient will require care spanning > 2 midnights and should be moved to inpatient because: Persistent severe electrolyte disturbances, IV treatments appropriate due to intensity of illness or inability to take PO and Inpatient level of care appropriate due to severity of illness, unsafe disposition, frequent falls.  Dispo: The patient is from: Home              Anticipated d/c is to: Likely to skilled nursing facility./ ALF at [facility]  Will get PT evaluation              Anticipated d/c date is: 2 days              Patient currently is not medically stable to d/c.   Consultants:  None  Procedures:  None  Antibiotics:  . None  Anti-infectives (From admission, onward)   Start     Dose/Rate Route Frequency Ordered Stop   10/29/20 2200  hydroxychloroquine (PLAQUENIL) tablet 200 mg        200 mg Oral 2 times daily 10/29/20 2015       Subjective: Today, patient was seen and examined at bedside. Complains of headache, and severe chest pain. No nausea, vomiting, fever and shortness of breath.   Objective: Vitals:   10/30/20 0135 10/30/20 0548  BP: (!) 145/60 112/83  Pulse: 85 95  Resp: 18 18  Temp: 98.2 F (36.8 C) 98.2 F (36.8 C)  SpO2: 97% 97%    Intake/Output Summary (Last 24 hours) at 10/30/2020 0717 Last data filed at 10/30/2020 0600 Gross per 24 hour  Intake 883.28 ml  Output --  Net 883.28 ml   Filed Weights   10/29/20 2134  Weight: 116.6 kg   Body mass index is 35.84 kg/m.   Physical Exam: GENERAL: Patient is alert awake and oriented. In mild distress.  Anxious mood, obese HENT: No scleral pallor or icterus. Pupils equally reactive to light. Oral mucosa is moist NECK: is supple, no gross swelling noted. CHEST: Right chest wall tenderness +.   Diminished breath sounds bilaterally.  No crackles or wheezes noted. CVS: S1 and S2 heard, no murmur. Regular rate and rhythm.  ABDOMEN: Soft, no specific tenderness noted  non-tender, bowel sounds are present. EXTREMITIES: No edema. CNS: Cranial nerves are intact. No focal motor deficits.  SKIN: warm and dry without rashes.  Data Review: I have personally reviewed the following laboratory data and studies,  CBC: Recent Labs  Lab 10/29/20 1054 10/30/20 0510  WBC 21.1* 15.2*  NEUTROABS 16.4* 11.3*  HGB 13.2 11.5*  HCT 40.7 35.3*  MCV 91.9 91.7  PLT 443* 053   Basic Metabolic Panel: Recent Labs  Lab 10/29/20 1054 10/30/20 0510  NA 130* 129*  K 4.4 4.4  CL 91* 95*  CO2 26 25  GLUCOSE 101* 101*  BUN 14 17  CREATININE 0.79 1.01  CALCIUM 9.7 8.7*  MG  --  2.0   Liver Function Tests: Recent Labs  Lab 10/29/20 1054 10/30/20 0510  AST 26 20  ALT 22 17  ALKPHOS 62 53  BILITOT 0.5 0.6  PROT 7.0 5.7*  ALBUMIN 4.1 3.3*   No results for input(s): LIPASE, AMYLASE in the last 168  hours. No results for input(s): AMMONIA in the last 168 hours. Cardiac Enzymes: Recent Labs  Lab 10/29/20 2059  CKTOTAL 65   BNP (last 3 results) No results for input(s): BNP in the last 8760 hours.  ProBNP (last 3 results) No results for input(s): PROBNP in the last 8760 hours.  CBG: Recent Labs  Lab 10/29/20 2156 10/30/20 0641  GLUCAP 107* 126*   Recent Results (from the past 240 hour(s))  Resp Panel by RT-PCR (Flu A&B, Covid) Nasopharyngeal Swab     Status: None   Collection Time: 10/29/20  8:06 PM   Specimen: Nasopharyngeal Swab; Nasopharyngeal(NP) swabs in vial transport medium  Result Value Ref Range Status   SARS Coronavirus 2 by RT PCR NEGATIVE NEGATIVE Final    Comment: (NOTE) SARS-CoV-2 target nucleic acids are NOT DETECTED.  The SARS-CoV-2 RNA is generally detectable in upper respiratory specimens during the acute phase of infection. The lowest concentration of SARS-CoV-2 viral copies this assay can detect is 138 copies/mL. A negative result does not preclude SARS-Cov-2 infection and should not be used as the sole basis for treatment  or other patient management decisions. A negative result may occur with  improper specimen collection/handling, submission of specimen other than nasopharyngeal swab, presence of viral mutation(s) within the areas targeted by this assay, and inadequate number of viral copies(<138 copies/mL). A negative result must be combined with clinical observations, patient history, and epidemiological information. The expected result is Negative.  Fact Sheet for Patients:  EntrepreneurPulse.com.au  Fact Sheet for Healthcare Providers:  IncredibleEmployment.be  This test is no t yet approved or cleared by the [place] FDA and  has been authorized for detection and/or diagnosis of SARS-CoV-2 by FDA under an Emergency Use Authorization (EUA). This EUA will remain  in effect (meaning this test can be used) for the duration of the COVID-19 declaration under Section 564(b)(1) of the Act, 21 U.S.C.section 360bbb-3(b)(1), unless the authorization is terminated  or revoked sooner.       Influenza A by PCR NEGATIVE NEGATIVE Final   Influenza B by PCR NEGATIVE NEGATIVE Final    Comment: (NOTE) The Xpert Xpress SARS-CoV-2/FLU/RSV plus assay is intended as an aid in the diagnosis of influenza from Nasopharyngeal swab specimens and should not be used as a sole basis for treatment. Nasal washings and aspirates are unacceptable for Xpert Xpress SARS-CoV-2/FLU/RSV testing.  Fact Sheet for Patients: EntrepreneurPulse.com.au  Fact Sheet for Healthcare Providers: IncredibleEmployment.be  This test is not yet approved or cleared by the [place] FDA and has been authorized for detection and/or diagnosis of SARS-CoV-2 by FDA under an Emergency Use Authorization (EUA). This EUA will remain in effect (meaning this test can be used) for the duration of the COVID-19 declaration under Section 564(b)(1) of the Act, 21 U.S.C. section  360bbb-3(b)(1), unless the authorization is terminated or revoked.  Performed at [facility], [place] [address] [place], [place] [address]      Studies: DG Ribs Unilateral W/Chest Right  Result Date: 10/29/2020 CLINICAL DATA:  [place] yesterday, RIGHT shoulder and rib pain EXAM: RIGHT RIBS AND CHEST - 3+ VIEW COMPARISON:  Chest radiograph 06/27/2020 FINDINGS: Normal heart size, mediastinal contours, and pulmonary vascularity. Slight elevation RIGHT diaphragm unchanged. No infiltrate, pleural effusion or pneumothorax. Diffuse osseous demineralization. Degenerative changes RIGHT AC joint. Mild RIGHT glenohumeral degenerative changes. No rib fracture or bone destruction. IMPRESSION: No acute abnormalities. Degenerative changes at RIGHT glenohumeral and acromioclavicular joints. Electronically Signed   By: Crist Infante.D.  On: 10/29/2020 11:22   DG Shoulder Right  Result Date: 10/29/2020 CLINICAL DATA:  Fall EXAM: RIGHT SHOULDER - 2+ VIEW COMPARISON:  None. FINDINGS: There is alignment at the glenohumeral joint. No acute fracture. Degenerative changes are present at the glenohumeral joint. High-riding humeral head likely reflects prior rotator cuff tear. There is chronic widening of the acromioclavicular joint with degenerative change. IMPRESSION: No acute fracture or malalignment. Electronically Signed   By: Macy Mis M.D.   On: 10/29/2020 11:21   CT Head Wo Contrast  Result Date: 10/29/2020 CLINICAL DATA:  Fall EXAM: CT HEAD WITHOUT CONTRAST TECHNIQUE: Contiguous axial images were obtained from the base of the skull through the vertex without intravenous contrast. COMPARISON:  October 2020 FINDINGS: Brain: There is no acute intracranial hemorrhage, mass effect, or edema. Gray-white differentiation is preserved. There is no extra-axial fluid collection. Prominence of the ventricles and sulci reflects stable parenchymal volume loss. Patchy hypoattenuation in the supratentorial white matter is  nonspecific but probably reflects stable chronic microvascular ischemic changes. Vascular: There is atherosclerotic calcification at the skull base. Skull: Calvarium is unremarkable. Sinuses/Orbits: Minor mucosal thickening. Bilateral lens replacements. Other: None. IMPRESSION: No evidence of acute intracranial injury. Electronically Signed   By: Macy Mis M.D.   On: 10/29/2020 11:33   CT Chest Wo Contrast  Result Date: 10/29/2020 CLINICAL DATA:  Right-sided rib pain after fall yesterday. EXAM: CT CHEST WITHOUT CONTRAST TECHNIQUE: Multidetector CT imaging of the chest was performed following the standard protocol without IV contrast. COMPARISON:  May 10, 2019. FINDINGS: Cardiovascular: Atherosclerosis of thoracic aorta is noted without aneurysm formation. Normal cardiac size. No pericardial effusion. Coronary artery calcifications are noted suggesting coronary artery disease. Mediastinum/Nodes: No enlarged mediastinal or axillary lymph nodes. Thyroid gland, trachea, and esophagus demonstrate no significant findings. Lungs/Pleura: No pneumothorax or pleural effusion is noted. Stable bibasilar and right upper lobe scarring is noted. No definite acute abnormality is noted. Upper Abdomen: Minimal cholelithiasis is noted. Musculoskeletal: No fracture is seen. IMPRESSION: 1. Coronary artery calcifications are noted suggesting coronary artery disease. 2. Stable bibasilar and right upper lobe scarring is noted. 3. Minimal cholelithiasis is noted. 4. Aortic atherosclerosis. Aortic Atherosclerosis (ICD10-I70.0). Electronically Signed   By: Marijo Conception M.D.   On: 10/29/2020 13:15      Flora Lipps, MD  [facility] 10/30/2020

## 2020-10-30 NOTE — TOC Initial Note (Addendum)
Transition of Care [facility]) - Initial/Assessment Note    Patient Details  Name: Jorge Sherman MRN: [id number] Date of Birth: December 01, 1940  Transition [facility]) CM/SW Contact:    Jorge Crews, RN Phone Number: [phone] 10/30/2020, 4:00 PM  Clinical Narrative:                  Spoke with patient at the bedside. Discussed message that NCM received to call his daughter. Patient consented. Spoke with Jorge Sherman who was agreeable to to SNF rehab, however, stated that her dad was of sound mind and could make his own decisions about rehab and where. She stated that he has all needed equipment from bed that goes up and down to electric wheelchairs. He has private caregivers who come 3 days a week MWF for 2-3 hours and sometimes on Sundays too.  She provided a contact from [facility], Jorge Sherman [phone]. She stated that he has an opportunity to move to [place] in [place], and that he has visited but has not agreed.   Spoke with patient at the bedside. He is adamant that he will return home with his caregivers. He stated that he has been to rehab 4 times and he ends up in a bed with 4 walls and no one to talk to. He stated that he is planning to hire extra time for caregivers. He is agreeable to home health. Offered choice of home health agency. He has uses several over the last year or so. Referral accepted by [facility] for PT and OT. Patient will need HH orders with Face to Face. Discussed getting out of bed during the day while in the hospital, and patient agreeable. Patient stated that he will need ambulance transport at discharge. He stated that he will let his landlord know when he will come home, and he will contact Jorge Sherman at [place] to be available for his discharge. He provided permission for NCM to call Jorge Sherman - call went to google voice, no message left this time.   Spoke with Jorge Sherman to follow up with patient discussion. She is in agreement with doing what her dad wants.   TOC  following for transition needs.   Expected Discharge Plan: [place] Barriers to Discharge: Continued Medical Work up   Patient Goals and CMS Choice Patient states their goals for this hospitalization and ongoing recovery are:: to go home CMS Medicare.gov Compare Post Acute Care list provided to:: Patient Choice offered to / list presented to : Patient  Expected Discharge Plan and Services Expected Discharge Plan: [place] In-house Referral: Clinical Social Work Discharge Planning Services: CM Consult Post Acute Care Choice: [place] arrangements for the past 2 months: Apartment                 DME Arranged: N/A DME Agency: NA       HH Arranged: PT,OT [place]: [place] Date [place]: 10/30/20 Time [place]: 1559 Representative spoke with at [place]: Jorge Sherman  Prior Living Arrangements/Services Living arrangements for the past 2 months: Apartment Lives with:: Self Patient language and need for interpreter reviewed:: Yes Do you feel safe going back to the place where you live?: Yes      Need for Family Participation in Patient Care: Yes (Comment) Care giver support system in place?: Yes (comment) Current home services: Homehealth aide,DME Criminal Activity/Legal Involvement Pertinent to Current Situation/Hospitalization: No - Comment as  needed  Activities of Daily Living Home Assistive Devices/Equipment: Electric scooter ADL Screening (condition at time of admission) Patient's cognitive ability adequate to safely complete daily activities?: No Is the patient deaf or have difficulty hearing?: No Does the patient have difficulty seeing, even when wearing glasses/contacts?: No Does the patient have difficulty concentrating, remembering, or making decisions?: No Patient able to express need for assistance with ADLs?: Yes Does the patient have difficulty dressing or bathing?: Yes Independently  performs ADLs?: No Bathing: Needs assistance In/Out Bed: Needs assistance Does the patient have difficulty walking or climbing stairs?: Yes Weakness of Legs: Both Weakness of Arms/Hands: Both  Permission Sought/Granted Permission sought to share information with : Family Supports Permission granted to share information with : Yes, Verbal Permission Granted  Share Information with NAME: Jorge Sherman     Permission granted to share info w Relationship: daughter  Permission granted to share info w Contact Information: [phone]  Emotional Assessment Appearance:: Appears stated age Attitude/Demeanor/Rapport: Engaged Affect (typically observed): Accepting Orientation: : Oriented to Self,Oriented to  Time,Oriented to Place,Oriented to Situation Alcohol / Substance Use: Not Applicable Psych Involvement: No (comment)  Admission diagnosis:  Dehydration with hyponatremia [E86.0, E87.1] Right-sided chest wall pain [R07.89] Fall in home, initial encounter [W19.XXXA, N02.725] Falls [id number]] Patient Active Problem List   Diagnosis Date Noted  . Falls 10/30/2020  . Dehydration with hyponatremia 10/29/2020  . Fall at home, initial encounter 10/29/2020  . Musculoskeletal chest pain 10/29/2020  . Mixed hyperlipidemia due to type 2 diabetes mellitus ([place]) 10/29/2020  . Benign prostatic hyperplasia without lower urinary tract symptoms 10/29/2020  . Ileus ([place])   . Declining functional status   . Goals of care, counseling/discussion   . Palliative care by specialist   . AKI (acute kidney injury) ([place]) 06/27/2020  . Acute diarrhea 06/27/2020  . Altered mental status 08/28/2019  . Septic joint of left knee joint ([place]) 04/22/2019  . Erectile dysfunction 10/30/2013  . Essential hypertension, benign 10/30/2013  . Coronary artery disease involving native coronary artery of native heart without angina pectoris 10/30/2013  . Dizziness and giddiness   . Pain in joint, lower leg   .  Leukocytosis, unspecified   . Type 2 diabetes mellitus without complication, with long-term current use of insulin ([place])   . Obstructive sleep apnea   . Depressive disorder, not elsewhere classified   . Essential and other specified forms of tremor   . Headache(784.0)   . Spinal stenosis, lumbar region, without neurogenic claudication   . Thoracic or lumbosacral neuritis or radiculitis, unspecified   . Chronic pain syndrome    PCP:  Merrilee Seashore, MD Pharmacy:   [id number] - [place], [place]. AT [address] [place] [address] Phone: [phone] Fax: [phone]  [id number] - [place], [place] [address] Phone: [phone] Fax: [place], [place] [address] Phone: [phone] Fax: [phone]     Social Determinants of Health (SDOH) Interventions    Readmission Risk Interventions Readmission Risk Prevention Plan 06/29/2020  Transportation Screening Complete  Home Care Screening Complete  Medication Review (RN CM) Complete  Some recent data might be hidden

## 2020-10-30 NOTE — Progress Notes (Signed)
New Admission Note: ? Arrival Method: Stretcher  Mental Orientation: Alert and Oriented X4 Telemetry: Box 3 Assessment: Completed Skin: Refer to flowsheet IV: Right Antecubital  Pain: 0/10 Tubes: None Safety Measures: Safety Fall Prevention Plan discussed with patient. Admission: Completed 5 [place] Orientation: Patient has been orientated to the room, unit and the staff. Family: None Orders have been reviewed and are being implemented. Will continue to monitor the patient. Call light has been placed within reach and bed alarm has been activated.  ? Milagros Loll, RN  Phone Number: [phone]

## 2020-10-30 NOTE — Evaluation (Signed)
Physical Therapy Evaluation Patient Details Name: Jorge SPRATLING MRN: [id number] DOB: 1941/05/17 Today's Date: 10/30/2020   History of Present Illness  79 year old male with past medical history of hypertension, chronic pain syndrome, hyperlipidemia, BPH, depression, diabetes mellitus type 2 (diet controlled), coronary artery disease (cath with BMS placement in 2009), OSA (not compliant with CPAP), iron deficiency anemia, rheumatoid arthritis who presents to [facility] emergency department with complaints of severe right chest wall and flank pain after a fall. Imaging negative for fracture.  Clinical Impression   Pt presents with body-wide weakness, history of multiple recent falls, body-wide pain most notable in shoulders R>L, and decreased activity tolerance vs baseline. Pt to benefit from acute PT to address deficits. Pt reporting severe migraine pain on eval, tolerating ROM/strength screen and repositioning only this session. PT encouraged pt to perform AROM UEs/LEs as tolerated, RN notified of pt's migraine. PT strongly recommending SNF level of care post-acutely, pt declines and states "I will go home, my girls (aides) will be waiting for me".  Pt currently only has 3 days/week of aide support, will need very increased support if d/c home. PT to progress mobility as tolerated, and will continue to follow acutely.      Follow Up Recommendations SNF;Home health PT;Supervision/Assistance - 24 hour    Equipment Recommendations  None recommended by PT    Recommendations for Other Services       Precautions / Restrictions Precautions Precautions: Fall Precaution Comments: severe bilateral shoulder pain, pt states "my shoulders are broken" Restrictions Weight Bearing Restrictions: No      Mobility  Bed Mobility Overal bed mobility: Needs Assistance             General bed mobility comments: Pt tolerating repositioning only, will require significant physical assist for  bed mobility tasks. PT elevated RUE to prevent distal edema and for pain relief, elevated HOB ~30* for pulmonary health    Transfers                 General transfer comment: pt unable  Ambulation/Gait             General Gait Details: pt unable  Stairs            Wheelchair Mobility    Modified Rankin (Stroke Patients Only)       Balance Overall balance assessment: History of Falls (5 falls in the past 2 weeks)                                           Pertinent Vitals/Pain Pain Assessment: Faces Faces Pain Scale: Hurts worst Pain Location: "all over", especially migraine and bilateral UEs R>L Pain Descriptors / Indicators: Sore;Discomfort;Grimacing;Headache Pain Intervention(s): Limited activity within patient's tolerance;Monitored during session;Patient requesting pain meds-RN notified;Repositioned    Home Living Family/patient expects to be discharged to:: Private residence Living Arrangements: Alone Available Help at Discharge: Personal care attendant;Available PRN/intermittently (caregivers 3-4 hours a day, 3 days a week) Type of Home: Other(Comment) (condo) Home Access: Level entry     Home Layout: One level Home Equipment: [place] - 2 wheels;Wheelchair - power      Prior Function Level of Independence: Needs assistance   Gait / Transfers Assistance Needed: pt reports recent falls, mostly transfer level from bed<>wheelchair, wheelchair<>recliner  ADL's / Homemaking Assistance Needed: Pt reports assist for ADLs when aides are there, otherwise "I do  the best I can". Pt has a microwave sitting on his kitchen table to prepare meals from fridge        Hand Dominance   Dominant Hand: Right    Extremity/Trunk Assessment   Upper Extremity Assessment Upper Extremity Assessment: Defer to OT evaluation (Pt able to elevate UEs to ~80* shoulder elevation, exclaiming in pain)    Lower Extremity Assessment Lower Extremity  Assessment: Generalized weakness;LLE deficits/detail;RLE deficits/detail RLE Deficits / Details: able to perform partial ROM hip and knee flexion/extension in supine, ankle pumps. VERY limited by pain presentation RLE: Unable to fully assess due to pain LLE Deficits / Details: see above LLE: Unable to fully assess due to pain    Cervical / Trunk Assessment Cervical / Trunk Assessment: Other exceptions Cervical / Trunk Exceptions: eval in supine, pt declines EOB or OOB  Communication   Communication: No difficulties  Cognition Arousal/Alertness: Awake/alert Behavior During Therapy: Restless Overall Cognitive Status: Impaired/Different from baseline Area of Impairment: Problem solving;Safety/judgement;Following commands                       Following Commands: Follows one step commands consistently Safety/Judgement: Decreased awareness of deficits   Problem Solving: Difficulty sequencing;Requires verbal cues;Requires tactile cues;Decreased initiation General Comments: Pt exclaiming "I am miserable, I want to die I am in so much pain" repeatedly throughout session. Pt lacks insight into deficits, reports he cen d/c home after acute stay but unable to mobilize at all this day.      General Comments General comments (skin integrity, edema, etc.): Bilateral shins are covered in bruises    Exercises Other Exercises Other Exercises: Pt encouraged to perform supine exercises as tolerated (heel slides, ankle pumps, finger flexion/extension, UE lifting and repositioning), pt agreeable   Assessment/Plan    PT Assessment Patient needs continued PT services  PT Problem List Decreased strength;Decreased mobility;Decreased balance;Decreased knowledge of use of DME;Pain;Decreased activity tolerance;Obesity;Decreased range of motion;Decreased safety awareness;Decreased coordination       PT Treatment Interventions DME instruction;Therapeutic activities;Gait training;Therapeutic  exercise;Patient/family education;Balance training;Functional mobility training;Neuromuscular re-education;Wheelchair mobility training    PT Goals (Current goals can be found in the Care Plan section)  Acute Rehab PT Goals Patient Stated Goal: go home PT Goal Formulation: With patient Time For Goal Achievement: 11/13/20 Potential to Achieve Goals: Fair    Frequency Min 3X/week   Barriers to discharge        Co-evaluation               AM-PAC PT "6 Clicks" Mobility  Outcome Measure Help needed turning from your back to your side while in a flat bed without using bedrails?: A Lot Help needed moving from lying on your back to sitting on the side of a flat bed without using bedrails?: Total Help needed moving to and from a bed to a chair (including a wheelchair)?: Total Help needed standing up from a chair using your arms (e.g., wheelchair or bedside chair)?: Total Help needed to walk in hospital room?: Total Help needed climbing 3-5 steps with a railing? : Total 6 Click Score: 7    End of Session   Activity Tolerance: Patient limited by pain Patient left: in bed;with call bell/phone within reach;with bed alarm set Nurse Communication: Mobility status;Patient requests pain meds PT Visit Diagnosis: Muscle weakness (generalized) (M62.81);Other abnormalities of gait and mobility (R26.89);Pain Pain - Right/Left: Right (bilateral) Pain - part of body: Shoulder    Time: 3382-5053 PT Time Calculation (min) (ACUTE  ONLY): 14 min   Charges:   PT Evaluation $PT Eval Low Complexity: 1 Low          Toney Lizaola S, PT Acute Rehabilitation Services Pager [phone]  Office [phone]    [place] E Ruffin Pyo 10/30/2020, 11:28 AM

## 2020-10-31 DIAGNOSIS — E860 Dehydration: Principal | ICD-10-CM

## 2020-10-31 DIAGNOSIS — E782 Mixed hyperlipidemia: Secondary | ICD-10-CM

## 2020-10-31 DIAGNOSIS — E871 Hypo-osmolality and hyponatremia: Secondary | ICD-10-CM

## 2020-10-31 DIAGNOSIS — G894 Chronic pain syndrome: Secondary | ICD-10-CM

## 2020-10-31 DIAGNOSIS — Y92009 Unspecified place in unspecified non-institutional (private) residence as the place of occurrence of the external cause: Secondary | ICD-10-CM

## 2020-10-31 DIAGNOSIS — R0789 Other chest pain: Secondary | ICD-10-CM

## 2020-10-31 DIAGNOSIS — W19XXXA Unspecified fall, initial encounter: Secondary | ICD-10-CM

## 2020-10-31 DIAGNOSIS — E1169 Type 2 diabetes mellitus with other specified complication: Secondary | ICD-10-CM

## 2020-10-31 LAB — TROPONIN I (HIGH SENSITIVITY): Troponin I (High Sensitivity): 4 ng/L (ref <18)

## 2020-10-31 LAB — CBC
HCT: 30.6 % — ABNORMAL LOW (ref 39.0–52.0)
Hemoglobin: 9.9 g/dL — ABNORMAL LOW (ref 13.0–17.0)
MCH: 29.9 pg (ref 26.0–34.0)
MCHC: 32.4 g/dL (ref 30.0–36.0)
MCV: 92.4 fL (ref 80.0–100.0)
Platelets: 284 10*3/uL (ref 150–400)
RBC: 3.31 MIL/uL — ABNORMAL LOW (ref 4.22–5.81)
RDW: 13.7 % (ref 11.5–15.5)
WBC: 14 10*3/uL — ABNORMAL HIGH (ref 4.0–10.5)
nRBC: 0 % (ref 0.0–0.2)

## 2020-10-31 LAB — GLUCOSE, CAPILLARY
Glucose-Capillary: 120 mg/dL — ABNORMAL HIGH (ref 70–99)
Glucose-Capillary: 141 mg/dL — ABNORMAL HIGH (ref 70–99)
Glucose-Capillary: 123 mg/dL — ABNORMAL HIGH (ref 70–99)
Glucose-Capillary: 119 mg/dL — ABNORMAL HIGH (ref 70–99)

## 2020-10-31 LAB — COMPREHENSIVE METABOLIC PANEL
ALT: 15 U/L (ref 0–44)
AST: 15 U/L (ref 15–41)
Albumin: 2.9 g/dL — ABNORMAL LOW (ref 3.5–5.0)
Alkaline Phosphatase: 44 U/L (ref 38–126)
Anion gap: 6 (ref 5–15)
BUN: 15 mg/dL (ref 8–23)
CO2: 25 mmol/L (ref 22–32)
Calcium: 8.6 mg/dL — ABNORMAL LOW (ref 8.9–10.3)
Chloride: 102 mmol/L (ref 98–111)
Creatinine, Ser: 0.84 mg/dL (ref 0.61–1.24)
GFR, Estimated: >60 mL/min (ref >60)
Glucose, Bld: 163 mg/dL — ABNORMAL HIGH (ref 70–99)
Potassium: 4.3 mmol/L (ref 3.5–5.1)
Sodium: 133 mmol/L — ABNORMAL LOW (ref 135–145)
Total Bilirubin: 0.5 mg/dL (ref 0.3–1.2)
Total Protein: 5.3 g/dL — ABNORMAL LOW (ref 6.5–8.1)

## 2020-10-31 LAB — URINE CULTURE

## 2020-10-31 LAB — MAGNESIUM: Magnesium: 1.8 mg/dL (ref 1.7–2.4)

## 2020-10-31 LAB — PHOSPHORUS: Phosphorus: 3.2 mg/dL (ref 2.5–4.6)

## 2020-10-31 MED ORDER — KETOROLAC TROMETHAMINE 30 MG/ML IJ SOLN
15.0000 mg | Freq: Once | INTRAMUSCULAR | Status: AC | PRN
  Administered 2020-10-31: 15 mg via INTRAVENOUS
  Filled 2020-10-31: qty 1

## 2020-10-31 NOTE — Progress Notes (Signed)
PROGRESS NOTE    Jorge Sherman  IRC:[id number] DOB: 10-01-1941 DOA: 10/29/2020 PCP: Merrilee Seashore, MD   Brief Narrative:  79 year old male with past medical history of hypertension, chronic pain syndrome, hyperlipidemia, BPH, depression, diabetes mellitus type 2(diet controlled), coronary artery disease(cath with BMS placement in 2009),OSA (not compliant with CPAP),iron deficiency anemia, rheumatoid arthritis who presented to the hospital with complaint of severe right chest wall and flank plain status post fall while attempting to transfer from his bed to his electric wheelchair.Patient was on the ground for almost 1 hour since he was unable to get up.  He does help baseline diffuse joint issues and minimal ambulatory dysfunction.  He also complained of worsening pain over his joints for the last several weeks.  It was also history of falls for at least 4-5 times over the last 2 weeks or so.  Patient was then brought into the hospital. In the ED,  patient was found to be hyponatremic with sodium of 130 with marked leukocytosis of 21.1, severe hemoconcentration and dehydration.  Chest x-ray did not show any evidence of fracture.Due to patient's suspected volume depletion with electrolyte abnormalities, recurrent falls and ongoing intractable pain patient was considered for admission to the hospital.   Assessment & Plan:   Principal Problem:   Dehydration with hyponatremia Active Problems:   Type 2 diabetes mellitus without complication, with long-term current use of insulin (HCC)   Chronic pain syndrome   Essential hypertension, benign   Coronary artery disease involving native coronary artery of native heart without angina pectoris   Fall at home, initial encounter   Musculoskeletal chest pain   Mixed hyperlipidemia due to type 2 diabetes mellitus ([place])   Benign prostatic hyperplasia without lower urinary tract symptoms   Falls   Dehydration with hyponatremia Poor oral  intake.  Hemoconcentration noted on admission.  Was on Ringer lactate.  Sodium has slightly LOWER AND WAS changed to normal saline, 133 now.  .  Closely monitor BMP.  Recurrent falls at home. Preceded by poor oral intake and hyponatremia.  X-ray without any fracture.  Appreciate physical therapy evaluation; pt declinign SNF-[facility] wants SNF.  Intractable chest wall pain secondary to fall.  EKG unremarkable.  Troponin NEG. Muscular pain on exam.  Continue supportive care.  Continue oxycodone. On Tylenol and NSAIDs as well. Added Lidoderm patch to the chest wall. CT chest did not show any acute issues.   Type 2 diabetes mellitus  Diet controlled at home.  Continue diabetic diet, sliding scale insulin while in hospital.  Chronic pain syndrome. Secondary to rheumatoid arthritis.  Not on any narcotics at this time.  Used to be on Suboxone and oxycodone in the past.  Currently on as needed narcotics due to severe pain. Complains of severe pain at this time.  Essential hypertension, benign Continue clonidine, amlodipine, tamsulosin.  Closely monitor blood pressure. Blood pressure seems to be stable at this time  Coronary artery disease  Continue statins.  Has been started on aspirin.  Was not on aspirin in the home medication list.    Mixed hyperlipidemia  Continue statin therapy  Benign prostatic hyperplasia without lower urinary tract symptoms  DVT prophylaxis: Lovenox SQ  Code Status: DNR    Code Status Orders  (From admission, onward)         Start     Ordered   10/29/20 2016  Do not attempt resuscitation (DNR)  Continuous       Question Answer Comment  In the event  of cardiac or respiratory ARREST Do not call a "code blue"   In the event of cardiac or respiratory ARREST Do not perform Intubation, CPR, defibrillation or ACLS   In the event of cardiac or respiratory ARREST Use medication by any route, position, wound care, and other measures to relive pain and  suffering. May use oxygen, suction and manual treatment of airway obstruction as needed for comfort.      10/29/20 2015        Code Status History    Date Active Date Inactive Code Status Order ID Comments User Context   10/29/2020 1926 10/29/2020 2015 DNR [id number]  Vernelle Emerald, MD ED   06/27/2020 1350 07/06/2020 2114 Full Code [id number]  Bonnielee Haff, MD ED   08/28/2019 1933 08/30/2019 2026 Partial Code [id number]  Orene Desanctis, DO ED   Advance Care Planning Activity    Advance Directive Documentation   Flowsheet Row Most Recent Value  Type of Advance Directive Healthcare Power of Attorney  Pre-existing out of facility DNR order (yellow form or pink MOST form) --  "MOST" Form in Place? --     Family Communication: Called daughter, NA, LM Disposition Plan:   Patient will remain inpatient for continued treatment of dehydration with IV fluids, electrolyte management.  We will secure an order for PT because risk of falls.  No safe discharge plan at this point Consults called: None Admission status: Inpatient   Consultants:   None  Procedures:  DG Ribs Unilateral W/Chest Right  Result Date: 10/29/2020 CLINICAL DATA:  [place] yesterday, RIGHT shoulder and rib pain EXAM: RIGHT RIBS AND CHEST - 3+ VIEW COMPARISON:  Chest radiograph 06/27/2020 FINDINGS: Normal heart size, mediastinal contours, and pulmonary vascularity. Slight elevation RIGHT diaphragm unchanged. No infiltrate, pleural effusion or pneumothorax. Diffuse osseous demineralization. Degenerative changes RIGHT AC joint. Mild RIGHT glenohumeral degenerative changes. No rib fracture or bone destruction. IMPRESSION: No acute abnormalities. Degenerative changes at RIGHT glenohumeral and acromioclavicular joints. Electronically Signed   By: Lavonia Dana M.D.   On: 10/29/2020 11:22   DG Shoulder Right  Result Date: 10/29/2020 CLINICAL DATA:  Fall EXAM: RIGHT SHOULDER - 2+ VIEW COMPARISON:  None. FINDINGS: There is alignment at the  glenohumeral joint. No acute fracture. Degenerative changes are present at the glenohumeral joint. High-riding humeral head likely reflects prior rotator cuff tear. There is chronic widening of the acromioclavicular joint with degenerative change. IMPRESSION: No acute fracture or malalignment. Electronically Signed   By: Macy Mis M.D.   On: 10/29/2020 11:21   CT Head Wo Contrast  Result Date: 10/29/2020 CLINICAL DATA:  Fall EXAM: CT HEAD WITHOUT CONTRAST TECHNIQUE: Contiguous axial images were obtained from the base of the skull through the vertex without intravenous contrast. COMPARISON:  October 2020 FINDINGS: Brain: There is no acute intracranial hemorrhage, mass effect, or edema. Gray-white differentiation is preserved. There is no extra-axial fluid collection. Prominence of the ventricles and sulci reflects stable parenchymal volume loss. Patchy hypoattenuation in the supratentorial white matter is nonspecific but probably reflects stable chronic microvascular ischemic changes. Vascular: There is atherosclerotic calcification at the skull base. Skull: Calvarium is unremarkable. Sinuses/Orbits: Minor mucosal thickening. Bilateral lens replacements. Other: None. IMPRESSION: No evidence of acute intracranial injury. Electronically Signed   By: Macy Mis M.D.   On: 10/29/2020 11:33   CT Chest Wo Contrast  Result Date: 10/29/2020 CLINICAL DATA:  Right-sided rib pain after fall yesterday. EXAM: CT CHEST WITHOUT CONTRAST TECHNIQUE: Multidetector CT imaging of the chest was  performed following the standard protocol without IV contrast. COMPARISON:  May 10, 2019. FINDINGS: Cardiovascular: Atherosclerosis of thoracic aorta is noted without aneurysm formation. Normal cardiac size. No pericardial effusion. Coronary artery calcifications are noted suggesting coronary artery disease. Mediastinum/Nodes: No enlarged mediastinal or axillary lymph nodes. Thyroid gland, trachea, and esophagus demonstrate no  significant findings. Lungs/Pleura: No pneumothorax or pleural effusion is noted. Stable bibasilar and right upper lobe scarring is noted. No definite acute abnormality is noted. Upper Abdomen: Minimal cholelithiasis is noted. Musculoskeletal: No fracture is seen. IMPRESSION: 1. Coronary artery calcifications are noted suggesting coronary artery disease. 2. Stable bibasilar and right upper lobe scarring is noted. 3. Minimal cholelithiasis is noted. 4. Aortic atherosclerosis. Aortic Atherosclerosis (ICD10-I70.0). Electronically Signed   By: Marijo Conception M.D.   On: 10/29/2020 13:15     Antimicrobials:   See MAR   Subjective: Patient multiple complaints this morning about generalized musculoskeletal pain, Also reported he did not like when he was in skilled nursing facilities in the past  Objective: Vitals:   10/30/20 2046 10/31/20 0519 10/31/20 0557 10/31/20 0942  BP: 130/69 (!) 148/70 117/75 128/64  Pulse: 92 94 90 88  Resp: 18 18  18   Temp: 98.4 F (36.9 C) 98 F (36.7 C) 98.5 F (36.9 C) 98 F (36.7 C)  TempSrc:  Oral Oral Oral  SpO2: 96% 99% 96% 98%  Weight:      Height:        Intake/Output Summary (Last 24 hours) at 10/31/2020 1136 Last data filed at 10/31/2020 0900 Gross per 24 hour  Intake 2799.25 ml  Output 1350 ml  Net 1449.25 ml   Filed Weights   10/29/20 2134  Weight: 116.6 kg    Examination:  General exam: No acute hemodynamic instability multiple complaints about staff, mad that they would not give him a Foley Respiratory system: Clear to auscultation. Respiratory effort normal. Cardiovascular system: S1 & S2 heard, RRR. No JVD, murmurs, rubs, gallops or clicks. No pedal edema. Gastrointestinal system: Abdomen is nondistended, soft and nontender. No organomegaly or masses felt. Normal bowel sounds heard. Central nervous system: Alert and oriented. No focal neurological deficits. Extremities: Warm well perfused, trace edema bilaterally, musculoskeletal  pain reproducible on exam. Skin: Multiple bruises Psychiatry: Judgement and insight appear normal. Mood & affect labile    Data Reviewed: I have personally reviewed following labs and imaging studies  CBC: Recent Labs  Lab 10/29/20 1054 10/30/20 0510 10/31/20 1001  WBC 21.1* 15.2* 14.0*  NEUTROABS 16.4* 11.3*  --   HGB 13.2 11.5* 9.9*  HCT 40.7 35.3* 30.6*  MCV 91.9 91.7 92.4  PLT 443* 292 654   Basic Metabolic Panel: Recent Labs  Lab 10/29/20 1054 10/30/20 0510 10/31/20 1001  NA 130* 129* 133*  K 4.4 4.4 4.3  CL 91* 95* 102  CO2 26 25 25   GLUCOSE 101* 101* 163*  BUN 14 17 15   CREATININE 0.79 1.01 0.84  CALCIUM 9.7 8.7* 8.6*  MG  --  2.0 1.8  PHOS  --   --  3.2   GFR: Estimated Creatinine Clearance: 92.6 mL/min (by C-G formula based on SCr of 0.84 mg/dL). Liver Function Tests: Recent Labs  Lab 10/29/20 1054 10/30/20 0510 10/31/20 1001  AST 26 20 15   ALT 22 17 15   ALKPHOS 62 53 44  BILITOT 0.5 0.6 0.5  PROT 7.0 5.7* 5.3*  ALBUMIN 4.1 3.3* 2.9*   No results for input(s): LIPASE, AMYLASE in the last 168 hours. No results  for input(s): AMMONIA in the last 168 hours. Coagulation Profile: No results for input(s): INR, PROTIME in the last 168 hours. Cardiac Enzymes: Recent Labs  Lab 10/29/20 2059  CKTOTAL 65   BNP (last 3 results) No results for input(s): PROBNP in the last 8760 hours. HbA1C: Recent Labs    10/29/20 2016  HGBA1C 5.7*   CBG: Recent Labs  Lab 10/30/20 1150 10/30/20 1702 10/30/20 2129 10/31/20 0608 10/31/20 1114  GLUCAP 130* 110* 145* 120* 141*   Lipid Profile: No results for input(s): CHOL, HDL, LDLCALC, TRIG, CHOLHDL, LDLDIRECT in the last 72 hours. Thyroid Function Tests: Recent Labs    10/29/20 2008  TSH 1.374   Anemia Panel: Recent Labs    10/29/20 2008 10/29/20 2059  VITAMINB12  --  355  FOLATE 21.9  --    Sepsis Labs: Recent Labs  Lab 10/29/20 2103  LATICACIDVEN 0.9    Recent Results (from the past  240 hour(s))  Resp Panel by RT-PCR (Flu A&B, Covid) Nasopharyngeal Swab     Status: None   Collection Time: 10/29/20  8:06 PM   Specimen: Nasopharyngeal Swab; Nasopharyngeal(NP) swabs in vial transport medium  Result Value Ref Range Status   SARS Coronavirus 2 by RT PCR NEGATIVE NEGATIVE Final    Comment: (NOTE) SARS-CoV-2 target nucleic acids are NOT DETECTED.  The SARS-CoV-2 RNA is generally detectable in upper respiratory specimens during the acute phase of infection. The lowest concentration of SARS-CoV-2 viral copies this assay can detect is 138 copies/mL. A negative result does not preclude SARS-Cov-2 infection and should not be used as the sole basis for treatment or other patient management decisions. A negative result may occur with  improper specimen collection/handling, submission of specimen other than nasopharyngeal swab, presence of viral mutation(s) within the areas targeted by this assay, and inadequate number of viral copies(<138 copies/mL). A negative result must be combined with clinical observations, patient history, and epidemiological information. The expected result is Negative.  Fact Sheet for Patients:  EntrepreneurPulse.com.au  Fact Sheet for Healthcare Providers:  IncredibleEmployment.be  This test is no t yet approved or cleared by the [place] FDA and  has been authorized for detection and/or diagnosis of SARS-CoV-2 by FDA under an Emergency Use Authorization (EUA). This EUA will remain  in effect (meaning this test can be used) for the duration of the COVID-19 declaration under Section 564(b)(1) of the Act, 21 U.S.C.section 360bbb-3(b)(1), unless the authorization is terminated  or revoked sooner.       Influenza A by PCR NEGATIVE NEGATIVE Final   Influenza B by PCR NEGATIVE NEGATIVE Final    Comment: (NOTE) The Xpert Xpress SARS-CoV-2/FLU/RSV plus assay is intended as an aid in the diagnosis of influenza  from Nasopharyngeal swab specimens and should not be used as a sole basis for treatment. Nasal washings and aspirates are unacceptable for Xpert Xpress SARS-CoV-2/FLU/RSV testing.  Fact Sheet for Patients: EntrepreneurPulse.com.au  Fact Sheet for Healthcare Providers: IncredibleEmployment.be  This test is not yet approved or cleared by the [place] FDA and has been authorized for detection and/or diagnosis of SARS-CoV-2 by FDA under an Emergency Use Authorization (EUA). This EUA will remain in effect (meaning this test can be used) for the duration of the COVID-19 declaration under Section 564(b)(1) of the Act, 21 U.S.C. section 360bbb-3(b)(1), unless the authorization is terminated or revoked.  Performed at [facility], [place] [address] [place], [place] [address]   Culture, blood (routine x 2)  Status: None (Preliminary result)   Collection Time: 10/29/20 10:05 PM   Specimen: BLOOD LEFT HAND  Result Value Ref Range Status   Specimen Description BLOOD LEFT HAND  Final   Special Requests   Final    BOTTLES DRAWN AEROBIC AND ANAEROBIC Blood Culture results may not be optimal due to an excessive volume of blood received in culture bottles   Culture   Final    NO GROWTH 1 DAY Performed at [facility], [place] [address], [place], [place] [address]    Report Status PENDING  Incomplete  Culture, blood (routine x 2)     Status: None (Preliminary result)   Collection Time: 10/29/20 10:05 PM   Specimen: BLOOD RIGHT HAND  Result Value Ref Range Status   Specimen Description BLOOD RIGHT HAND  Final   Special Requests   Final    BOTTLES DRAWN AEROBIC AND ANAEROBIC Blood Culture results may not be optimal due to an excessive volume of blood received in culture bottles   Culture   Final    NO GROWTH 1 DAY Performed at [facility], [place] [address], [place], [place] [address]    Report Status PENDING  Incomplete          Radiology Studies: CT Chest Wo Contrast  Result Date: 10/29/2020 CLINICAL DATA:  Right-sided rib pain after fall yesterday. EXAM: CT CHEST WITHOUT CONTRAST TECHNIQUE: Multidetector CT imaging of the chest was performed following the standard protocol without IV contrast. COMPARISON:  May 10, 2019. FINDINGS: Cardiovascular: Atherosclerosis of thoracic aorta is noted without aneurysm formation. Normal cardiac size. No pericardial effusion. Coronary artery calcifications are noted suggesting coronary artery disease. Mediastinum/Nodes: No enlarged mediastinal or axillary lymph nodes. Thyroid gland, trachea, and esophagus demonstrate no significant findings. Lungs/Pleura: No pneumothorax or pleural effusion is noted. Stable bibasilar and right upper lobe scarring is noted. No definite acute abnormality is noted. Upper Abdomen: Minimal cholelithiasis is noted. Musculoskeletal: No fracture is seen. IMPRESSION: 1. Coronary artery calcifications are noted suggesting coronary artery disease. 2. Stable bibasilar and right upper lobe scarring is noted. 3. Minimal cholelithiasis is noted. 4. Aortic atherosclerosis. Aortic Atherosclerosis (ICD10-I70.0). Electronically Signed   By: Marijo Conception M.D.   On: 10/29/2020 13:15        Scheduled Meds: . amLODipine  2.5 mg Oral Daily  . aspirin EC  81 mg Oral Daily  . celecoxib  200 mg Oral BID  . cloNIDine  0.1 mg Oral BID  . DULoxetine  30 mg Oral BID  . enoxaparin (LOVENOX) injection  40 mg Subcutaneous Q24H  . gabapentin  800 mg Oral TID  . hydroxychloroquine  200 mg Oral BID  . insulin aspart  0-15 Units Subcutaneous TID AC & HS  . lidocaine  1 patch Transdermal Q24H  . linaclotide  72 mcg Oral QAC breakfast  . pantoprazole  40 mg Oral Daily  . rosuvastatin  40 mg Oral QPM  . saccharomyces boulardii  250 mg Oral BID  . tamsulosin  0.4 mg Oral Daily   Continuous Infusions:   LOS: 1 day    Time spent: [place],  MD [facility]  If 7PM-7AM, please contact night-coverage  10/31/2020, 11:36 AM

## 2020-10-31 NOTE — Progress Notes (Signed)
OT Cancellation Note  Patient Details Name: ERNESTO ZUKOWSKI MRN: [id number] DOB: [birth year]   Cancelled Treatment:    Reason Eval/Treat Not Completed: Other (comment) (Pt eating lunch).  Will reattempt.  Nilsa Nutting., OTR/L Acute Rehabilitation Services Pager [phone] Office [phone]   Lucille Passy M 10/31/2020, 3:36 PM

## 2020-10-31 NOTE — Progress Notes (Signed)
Patient complaining of chest pain and discomfort. Upon assessment patient's vital signs were stable and sinus rhythm on telemetry. Prn ativan given. And Dr. Marlowe Sax, MD notified and ordered EKG and Troponin on patient. Will continue to monitor.   Fernie Grimm,RN.

## 2020-10-31 NOTE — Progress Notes (Signed)
[facility] notiefied via chat that patient is requesting IV Toradol stating pain is 12/10 back pain 2 percocet's were given but patient requesting Toradol

## 2020-11-01 LAB — GLUCOSE, CAPILLARY
Glucose-Capillary: 101 mg/dL — ABNORMAL HIGH (ref 70–99)
Glucose-Capillary: 122 mg/dL — ABNORMAL HIGH (ref 70–99)
Glucose-Capillary: 104 mg/dL — ABNORMAL HIGH (ref 70–99)
Glucose-Capillary: 121 mg/dL — ABNORMAL HIGH (ref 70–99)

## 2020-11-01 MED ORDER — KETOROLAC TROMETHAMINE 15 MG/ML IJ SOLN
15.0000 mg | Freq: Four times a day (QID) | INTRAMUSCULAR | Status: AC | PRN
  Administered 2020-11-01 – 2020-11-02 (×5): 15 mg via INTRAVENOUS
  Filled 2020-11-01 (×6): qty 1

## 2020-11-01 NOTE — Plan of Care (Signed)
  Problem: Clinical Measurements: Goal: Ability to maintain clinical measurements within normal limits will improve Outcome: Progressing Goal: Will remain free from infection Outcome: Progressing Goal: Diagnostic test results will improve Outcome: Progressing Goal: Respiratory complications will improve Outcome: Progressing Goal: Cardiovascular complication will be avoided Outcome: Progressing   Problem: Clinical Measurements: Goal: Ability to maintain clinical measurements within normal limits will improve Outcome: Progressing Goal: Will remain free from infection Outcome: Progressing Goal: Diagnostic test results will improve Outcome: Progressing Goal: Respiratory complications will improve Outcome: Progressing Goal: Cardiovascular complication will be avoided Outcome: Progressing   Problem: Activity: Goal: Risk for activity intolerance will decrease Outcome: Not Progressing   Problem: Nutrition: Goal: Adequate nutrition will be maintained Outcome: Progressing   Problem: Coping: Goal: Level of anxiety will decrease Outcome: Progressing   Problem: Elimination: Goal: Will not experience complications related to bowel motility Outcome: Progressing Goal: Will not experience complications related to urinary retention Outcome: Progressing   Problem: Pain Managment: Goal: General experience of comfort will improve Outcome: Progressing

## 2020-11-01 NOTE — Plan of Care (Signed)

## 2020-11-01 NOTE — Progress Notes (Signed)
PROGRESS NOTE    Jorge Sherman  URK:[id number] DOB: 03/15/1941 DOA: 10/29/2020 PCP: Merrilee Seashore, MD   Brief Narrative:  79 year old male with past medical history of hypertension, chronic pain syndrome, hyperlipidemia, BPH, depression, diabetes mellitus type 2(diet controlled), coronary artery disease(cath with BMS placement in 2009),OSA (not compliant with CPAP),iron deficiency anemia, rheumatoid arthritis who presentedto the hospital with complaint of severe right chest wall and flank plain status post fall while attempting to transfer from his bed to his electric wheelchair.Patient was on the ground for almost 1 hour since he was unable to get up. He does help baseline diffuse joint issues and minimal ambulatory dysfunction.He also complained of worsening pain over his joints for the last several weeks. It was also history of falls for at least 4-5 times over the last 2 weeks or so. Patient was then brought into the hospital.In the ED,patient was found to be hyponatremic with sodium of 130 with marked leukocytosis of 21.1,severe hemoconcentration and dehydration.Chest x-ray did not show any evidence of fracture.Due to patient's suspectedvolume depletionwith electrolyte abnormalities,recurrent fallsand ongoing intractable pain patient was considered for admission to the hospital.   Assessment & Plan:   Principal Problem:   Dehydration with hyponatremia Active Problems:   Type 2 diabetes mellitus without complication, with long-term current use of insulin (HCC)   Chronic pain syndrome   Essential hypertension, benign   Coronary artery disease involving native coronary artery of native heart without angina pectoris   Fall at home, initial encounter   Musculoskeletal chest pain   Mixed hyperlipidemia due to type 2 diabetes mellitus ([place])   Benign prostatic hyperplasia without lower urinary tract symptoms   Falls   Dehydration with hyponatremia Poor oral  intake. Hemoconcentration noted on admission. Was on Ringer lactate. Sodium has slightly LOWER AND WAS changed to normal saline, 133 now. . Closely monitor BMP.  Recurrent falls at home.Preceded by poor oral intake and hyponatremia. X-ray without any fracture. Appreciate physical therapy evaluation; pt declinign SNF-FaMILY wants SNF.Discussed with daughter on Sunday who is HCPOA and she feels pt will be unsafe, she indicated she has arrnaged for a room at [facility]  Intractable chest wall pain secondary to fall.EKG unremarkable. Troponin NEG. Muscular pain on exam.Continue supportive care. Continue oxycodone. On Tylenol and NSAIDs as well. Added Lidoderm patch to the chest wall. CT chest did not show any acute issues.  Type 2 diabetes mellitus Diet controlled at home. Continue diabetic diet, sliding scale insulin while in hospital.  Chronic pain syndrome.Secondary to rheumatoid arthritis. Not on any narcotics at this time. Used to be on Suboxone and oxycodone in the past. Currently on as needed narcotics due to severe pain. Added 5 doses of Toradol  Essential hypertension, benign Continue clonidine, amlodipine, tamsulosin. Closely monitor blood pressure.Blood pressure seems to be stable at this time  Coronary artery disease  Continue statins. Has been started on aspirin.Was not on aspirin in the home medication list.   Mixed hyperlipidemia Continue statin therapy  Benign prostatic hyperplasia without lower urinary tract symptoms  DVT prophylaxis: Lovenox SQ  Code Status: DNR    Code Status Orders  (From admission, onward)         Start     Ordered   10/29/20 2016  Do not attempt resuscitation (DNR)  Continuous       Question Answer Comment  In the event of cardiac or respiratory ARREST Do not call a "code blue"   In the event of cardiac or respiratory ARREST  Do not perform Intubation, CPR, defibrillation or ACLS   In the event  of cardiac or respiratory ARREST Use medication by any route, position, wound care, and other measures to relive pain and suffering. May use oxygen, suction and manual treatment of airway obstruction as needed for comfort.      10/29/20 2015        Code Status History    Date Active Date Inactive Code Status Order ID Comments User Context   10/29/2020 1926 10/29/2020 2015 DNR [id number]  Vernelle Emerald, MD ED   06/27/2020 1350 07/06/2020 2114 Full Code [id number]  Bonnielee Haff, MD ED   08/28/2019 1933 08/30/2019 2026 Partial Code [id number]  Orene Desanctis, DO ED   Advance Care Planning Activity    Advance Directive Documentation   Flowsheet Row Most Recent Value  Type of Advance Directive Healthcare Power of [place]  Pre-existing out of facility DNR order (yellow form or pink MOST form) --  "MOST" Form in Place? --     Family Communication: Discussed with [name] on Sunday Disposition Plan:   Patient remained inpatient for continued IV pain medication management, he is not yet stable for discharge Consults called: None Admission status: Inpatient   Consultants:   None  Procedures:  DG Ribs Unilateral W/Chest Right  Result Date: 10/29/2020 CLINICAL DATA:  [place] yesterday, RIGHT shoulder and rib pain EXAM: RIGHT RIBS AND CHEST - 3+ VIEW COMPARISON:  Chest radiograph 06/27/2020 FINDINGS: Normal heart size, mediastinal contours, and pulmonary vascularity. Slight elevation RIGHT diaphragm unchanged. No infiltrate, pleural effusion or pneumothorax. Diffuse osseous demineralization. Degenerative changes RIGHT AC joint. Mild RIGHT glenohumeral degenerative changes. No rib fracture or bone destruction. IMPRESSION: No acute abnormalities. Degenerative changes at RIGHT glenohumeral and acromioclavicular joints. Electronically Signed   By: Lavonia Dana M.D.   On: 10/29/2020 11:22   DG Shoulder Right  Result Date: 10/29/2020 CLINICAL DATA:  Fall EXAM: RIGHT SHOULDER - 2+ VIEW COMPARISON:  None.  FINDINGS: There is alignment at the glenohumeral joint. No acute fracture. Degenerative changes are present at the glenohumeral joint. High-riding humeral head likely reflects prior rotator cuff tear. There is chronic widening of the acromioclavicular joint with degenerative change. IMPRESSION: No acute fracture or malalignment. Electronically Signed   By: Macy Mis M.D.   On: 10/29/2020 11:21   CT Head Wo Contrast  Result Date: 10/29/2020 CLINICAL DATA:  Fall EXAM: CT HEAD WITHOUT CONTRAST TECHNIQUE: Contiguous axial images were obtained from the base of the skull through the vertex without intravenous contrast. COMPARISON:  October 2020 FINDINGS: Brain: There is no acute intracranial hemorrhage, mass effect, or edema. Gray-white differentiation is preserved. There is no extra-axial fluid collection. Prominence of the ventricles and sulci reflects stable parenchymal volume loss. Patchy hypoattenuation in the supratentorial white matter is nonspecific but probably reflects stable chronic microvascular ischemic changes. Vascular: There is atherosclerotic calcification at the skull base. Skull: Calvarium is unremarkable. Sinuses/Orbits: Minor mucosal thickening. Bilateral lens replacements. Other: None. IMPRESSION: No evidence of acute intracranial injury. Electronically Signed   By: Macy Mis M.D.   On: 10/29/2020 11:33   CT Chest Wo Contrast  Result Date: 10/29/2020 CLINICAL DATA:  Right-sided rib pain after fall yesterday. EXAM: CT CHEST WITHOUT CONTRAST TECHNIQUE: Multidetector CT imaging of the chest was performed following the standard protocol without IV contrast. COMPARISON:  May 10, 2019. FINDINGS: Cardiovascular: Atherosclerosis of thoracic aorta is noted without aneurysm formation. Normal cardiac size. No pericardial effusion. Coronary artery calcifications are noted suggesting coronary artery  disease. Mediastinum/Nodes: No enlarged mediastinal or axillary lymph nodes. Thyroid gland,  trachea, and esophagus demonstrate no significant findings. Lungs/Pleura: No pneumothorax or pleural effusion is noted. Stable bibasilar and right upper lobe scarring is noted. No definite acute abnormality is noted. Upper Abdomen: Minimal cholelithiasis is noted. Musculoskeletal: No fracture is seen. IMPRESSION: 1. Coronary artery calcifications are noted suggesting coronary artery disease. 2. Stable bibasilar and right upper lobe scarring is noted. 3. Minimal cholelithiasis is noted. 4. Aortic atherosclerosis. Aortic Atherosclerosis (ICD10-I70.0). Electronically Signed   By: Marijo Conception M.D.   On: 10/29/2020 13:15     Antimicrobials:   See MAR   Subjective: Reports pain CONTROLsomewhat improved Did receive Toradol x1 last night   Objective: Vitals:   11/01/20 0049 11/01/20 0415 11/01/20 0550 11/01/20 0946  BP:  (!) 145/83  (!) 150/89  Pulse: 88 85 85 88  Resp: 14  15 16   Temp:  98.9 F (37.2 C)  98.6 F (37 C)  TempSrc:  Oral  Oral  SpO2: 98% 98%  97%  Weight:      Height:        Intake/Output Summary (Last 24 hours) at 11/01/2020 1130 Last data filed at 11/01/2020 0427 Gross per 24 hour  Intake 520 ml  Output 2025 ml  Net -1505 ml   Filed Weights   10/29/20 2134 10/31/20 2101  Weight: 116.6 kg 116.6 kg    Examination:  General exam: Appears calm and comfortable  Respiratory system: Clear to auscultation. Respiratory effort normal. Cardiovascular system: S1 & S2 heard, RRR. No JVD, murmurs, rubs, gallops or clicks. No pedal edema. Gastrointestinal system: Abdomen is nondistended, soft and nontender. No organomegaly or masses felt. Normal bowel sounds heard. Central nervous system: Alert and oriented. No focal neurological deficits. Extremities: Warm well perfused neurovascularly intact Skin: No rashes, lesions or ulcers Psychiatry: Judgement and insight appear normal. Mood & affect appropriate.     Data Reviewed: I have personally reviewed following labs  and imaging studies  CBC: Recent Labs  Lab 10/29/20 1054 10/30/20 0510 10/31/20 1001  WBC 21.1* 15.2* 14.0*  NEUTROABS 16.4* 11.3*  --   HGB 13.2 11.5* 9.9*  HCT 40.7 35.3* 30.6*  MCV 91.9 91.7 92.4  PLT 443* 292 629   Basic Metabolic Panel: Recent Labs  Lab 10/29/20 1054 10/30/20 0510 10/31/20 1001  NA 130* 129* 133*  K 4.4 4.4 4.3  CL 91* 95* 102  CO2 26 25 25   GLUCOSE 101* 101* 163*  BUN 14 17 15   CREATININE 0.79 1.01 0.84  CALCIUM 9.7 8.7* 8.6*  MG  --  2.0 1.8  PHOS  --   --  3.2   GFR: Estimated Creatinine Clearance: 92.6 mL/min (by C-G formula based on SCr of 0.84 mg/dL). Liver Function Tests: Recent Labs  Lab 10/29/20 1054 10/30/20 0510 10/31/20 1001  AST 26 20 15   ALT 22 17 15   ALKPHOS 62 53 44  BILITOT 0.5 0.6 0.5  PROT 7.0 5.7* 5.3*  ALBUMIN 4.1 3.3* 2.9*   No results for input(s): LIPASE, AMYLASE in the last 168 hours. No results for input(s): AMMONIA in the last 168 hours. Coagulation Profile: No results for input(s): INR, PROTIME in the last 168 hours. Cardiac Enzymes: Recent Labs  Lab 10/29/20 2059  CKTOTAL 65   BNP (last 3 results) No results for input(s): PROBNP in the last 8760 hours. HbA1C: Recent Labs    10/29/20 2016  HGBA1C 5.7*   CBG: Recent Labs  Lab 10/31/20  [place] 10/31/20 1636 10/31/20 2101 11/01/20 0756 11/01/20 1129  GLUCAP 141* 123* 119* 101* 122*   Lipid Profile: No results for input(s): CHOL, HDL, LDLCALC, TRIG, CHOLHDL, LDLDIRECT in the last 72 hours. Thyroid Function Tests: Recent Labs    10/29/20 2008  TSH 1.374   Anemia Panel: Recent Labs    10/29/20 2008 10/29/20 2059  VITAMINB12  --  355  FOLATE 21.9  --    Sepsis Labs: Recent Labs  Lab 10/29/20 2103  LATICACIDVEN 0.9    Recent Results (from the past 240 hour(s))  Resp Panel by RT-PCR (Flu A&B, Covid) Nasopharyngeal Swab     Status: None   Collection Time: 10/29/20  8:06 PM   Specimen: Nasopharyngeal Swab; Nasopharyngeal(NP) swabs  in vial transport medium  Result Value Ref Range Status   SARS Coronavirus 2 by RT PCR NEGATIVE NEGATIVE Final    Comment: (NOTE) SARS-CoV-2 target nucleic acids are NOT DETECTED.  The SARS-CoV-2 RNA is generally detectable in upper respiratory specimens during the acute phase of infection. The lowest concentration of SARS-CoV-2 viral copies this assay can detect is 138 copies/mL. A negative result does not preclude SARS-Cov-2 infection and should not be used as the sole basis for treatment or other patient management decisions. A negative result may occur with  improper specimen collection/handling, submission of specimen other than nasopharyngeal swab, presence of viral mutation(s) within the areas targeted by this assay, and inadequate number of viral copies(<138 copies/mL). A negative result must be combined with clinical observations, patient history, and epidemiological information. The expected result is Negative.  Fact Sheet for Patients:  EntrepreneurPulse.com.au  Fact Sheet for Healthcare Providers:  IncredibleEmployment.be  This test is no t yet approved or cleared by the [place] FDA and  has been authorized for detection and/or diagnosis of SARS-CoV-2 by FDA under an Emergency Use Authorization (EUA). This EUA will remain  in effect (meaning this test can be used) for the duration of the COVID-19 declaration under Section 564(b)(1) of the Act, 21 U.S.C.section 360bbb-3(b)(1), unless the authorization is terminated  or revoked sooner.       Influenza A by PCR NEGATIVE NEGATIVE Final   Influenza B by PCR NEGATIVE NEGATIVE Final    Comment: (NOTE) The Xpert Xpress SARS-CoV-2/FLU/RSV plus assay is intended as an aid in the diagnosis of influenza from Nasopharyngeal swab specimens and should not be used as a sole basis for treatment. Nasal washings and aspirates are unacceptable for Xpert Xpress  SARS-CoV-2/FLU/RSV testing.  Fact Sheet for Patients: EntrepreneurPulse.com.au  Fact Sheet for Healthcare Providers: IncredibleEmployment.be  This test is not yet approved or cleared by the [place] FDA and has been authorized for detection and/or diagnosis of SARS-CoV-2 by FDA under an Emergency Use Authorization (EUA). This EUA will remain in effect (meaning this test can be used) for the duration of the COVID-19 declaration under Section 564(b)(1) of the Act, 21 U.S.C. section 360bbb-3(b)(1), unless the authorization is terminated or revoked.  Performed at [facility], [place] [address], [place], [place] [address]   Culture, Urine     Status: Abnormal   Collection Time: 10/29/20  8:43 PM   Specimen: Urine, Random  Result Value Ref Range Status   Specimen Description URINE, RANDOM  Final   Special Requests   Final    NONE Performed at [facility], [place] [address] [place],  [address]    Culture MULTIPLE SPECIES PRESENT, SUGGEST RECOLLECTION (A)  Final   Report Status 10/31/2020  FINAL  Final  Culture, blood (routine x 2)     Status: None (Preliminary result)   Collection Time: 10/29/20 10:05 PM   Specimen: BLOOD LEFT HAND  Result Value Ref Range Status   Specimen Description BLOOD LEFT HAND  Final   Special Requests   Final    BOTTLES DRAWN AEROBIC AND ANAEROBIC Blood Culture results may not be optimal due to an excessive volume of blood received in culture bottles   Culture   Final    NO GROWTH 1 DAY Performed at [facility], [place] [address] [place], [place] [address]    Report Status PENDING  Incomplete  Culture, blood (routine x 2)     Status: None (Preliminary result)   Collection Time: 10/29/20 10:05 PM   Specimen: BLOOD RIGHT HAND  Result Value Ref Range Status   Specimen Description BLOOD RIGHT HAND  Final   Special Requests   Final    BOTTLES DRAWN AEROBIC AND ANAEROBIC Blood Culture results may  not be optimal due to an excessive volume of blood received in culture bottles   Culture   Final    NO GROWTH 1 DAY Performed at [facility], [place] [address] [place], [place] [address]    Report Status PENDING  Incomplete         Radiology Studies: No results found.      Scheduled Meds: . amLODipine  2.5 mg Oral Daily  . aspirin EC  81 mg Oral Daily  . celecoxib  200 mg Oral BID  . cloNIDine  0.1 mg Oral BID  . DULoxetine  30 mg Oral BID  . enoxaparin (LOVENOX) injection  40 mg Subcutaneous Q24H  . gabapentin  800 mg Oral TID  . hydroxychloroquine  200 mg Oral BID  . insulin aspart  0-15 Units Subcutaneous TID AC & HS  . lidocaine  1 patch Transdermal Q24H  . linaclotide  72 mcg Oral QAC breakfast  . pantoprazole  40 mg Oral Daily  . rosuvastatin  40 mg Oral QPM  . saccharomyces boulardii  250 mg Oral BID  . tamsulosin  0.4 mg Oral Daily   Continuous Infusions:   LOS: 2 days    Time spent: [place], MD [facility]  If 7PM-7AM, please contact night-coverage  11/01/2020, 11:30 AM

## 2020-11-01 NOTE — Evaluation (Signed)
Occupational Therapy Evaluation Patient Details Name: Jorge Sherman MRN: [id number] DOB: 05-Jan-1941 Today's Date: 11/01/2020    History of Present Illness 79 year old male with past medical history of hypertension, chronic pain syndrome, hyperlipidemia, BPH, depression, diabetes mellitus type 2 (diet controlled), coronary artery disease (cath with BMS placement in 2009), OSA (not compliant with CPAP), iron deficiency anemia, rheumatoid arthritis who presents to [facility] emergency department with complaints of severe right chest wall and flank pain after a fall. Imaging negative for fracture.   Clinical Impression   PTA pt living alone and functioning at mod I level. He does endorse a recent history of several falls (5 in last 2 weeks). He reports his daughter and him have been planning for him to move to [place], but at end of session pt refusing this option. He shows decreased awareness of safety/deficits at this time. Pt was able to complete bed mobility with min A and sit <> stands with min-mod A and RW. He transferred to recliner for lunch, then requested OT assist to get back to bed. Attempted to educate pt on his current physical needs and how ALF would be a suitable living condition at this time for him, but he remained resistant. If pt is to d/c to [place], recommend HHOT. If not, recommend SNF. Will continue to follow per POC listed below.     Follow Up Recommendations  SNF;Home health OT;Supervision/Assistance - 24 hour (SNF unless going to [place] with HHOT)    Equipment Recommendations  None recommended by OT    Recommendations for Other Services       Precautions / Restrictions Precautions Precautions: Fall Restrictions Weight Bearing Restrictions: No      Mobility Bed Mobility Overal bed mobility: Needs Assistance Bed Mobility: Supine to Sit;Sit to Supine     Supine to sit: Min assist Sit to supine: Min assist   General bed mobility  comments: light min A to pull up at trunk into sitting and to assist legs back on bed    Transfers Overall transfer level: Needs assistance Equipment used: Rolling walker (2 wheeled) Transfers: Sit to/from [facility] Sit to Stand: Min assist Stand pivot transfers: Mod assist       General transfer comment: min A to rise and steady into standing with RW; mod A for steadying over to chair    Balance Overall balance assessment: History of Falls;Needs assistance Sitting-balance support: Feet supported Sitting balance-Leahy Scale: Fair     Standing balance support: Bilateral upper extremity supported;During functional activity Standing balance-Leahy Scale: Poor Standing balance comment: reliant on external support                           ADL either performed or assessed with clinical judgement   ADL Overall ADL's : Needs assistance/impaired Eating/Feeding: Set up;Sitting Eating/Feeding Details (indicate cue type and reason): sat up in chair for lunch Grooming: Set up;Sitting   Upper Body Bathing: Minimal assistance;Sitting   Lower Body Bathing: Moderate assistance;Sitting/lateral leans;Sit to/from stand   Upper Body Dressing : Minimal assistance;Sitting   Lower Body Dressing: Maximal assistance;Sitting/lateral leans;Sit to/from stand Lower Body Dressing Details (indicate cue type and reason): does not don own socks at baseline Toilet Transfer: RW;Moderate assistance;Stand-pivot Armed forces technical officer Details (indicate cue type and reason): stand pivot to recliner as toilet transfer simulation Toileting- Clothing Manipulation and Hygiene: Total assistance Toileting - Clothing Manipulation Details (indicate cue type and reason): total A bed level- pt  refused usage of BSC and wanted to use bed pan     Functional mobility during ADLs: Minimal assistance;Rolling walker;Cueing for sequencing;Cueing for safety       Vision         Perception      Praxis      Pertinent Vitals/Pain Pain Assessment: Faces Faces Pain Scale: Hurts little more Pain Location: bil shoulders Pain Descriptors / Indicators: Sore;Discomfort;Grimacing Pain Intervention(s): Limited activity within patient's tolerance;Monitored during session;Repositioned     Hand Dominance     Extremity/Trunk Assessment Upper Extremity Assessment Upper Extremity Assessment: Generalized weakness;RUE deficits/detail;LUE deficits/detail RUE Deficits / Details: difficulty with AROM past 90 degrees. Hikes shoulder when feeding self LUE Deficits / Details: increased pain compared to R; difficulty mobilizing past 90*   Lower Extremity Assessment Lower Extremity Assessment: Defer to PT evaluation       Communication Communication Communication: No difficulties   Cognition Arousal/Alertness: Awake/alert Behavior During Therapy: Restless Overall Cognitive Status: Impaired/Different from baseline Area of Impairment: Safety/judgement;Problem solving;Memory                     Memory: Decreased short-term memory   Safety/Judgement: Decreased awareness of safety;Decreased awareness of deficits   Problem Solving: Requires verbal cues General Comments: shows decreased awareness of deficits/safety. Wanting to d/c home despite many falls. Stated at start of session he is going to go to [place] per his daughters request, and at end of session said "I am not going to a facilitity"   General Comments  several areas of bruising 2/2 falls    Exercises     Shoulder Instructions      Home Living Family/patient expects to be discharged to:: Private residence Living Arrangements: Alone Available Help at Discharge: Personal care attendant;Available PRN/intermittently (caregivers 3-4 hours per day 3x per week) Type of Home: Other(Comment) (condo) Home Access: Level entry     Home Layout: One level     Bathroom Shower/Tub: Occupational psychologist:  Handicapped height     Home Equipment: Environmental consultant - 2 wheels;Wheelchair - power          Prior Functioning/Environment Level of Independence: Needs assistance  Gait / Transfers Assistance Needed: pt reports recent falls, mostly transfer level from bed<>wheelchair, wheelchair<>recliner ADL's / Homemaking Assistance Needed: Pt reports assist for ADLs when aides are there, otherwise "I do the best I can". Pt has a microwave sitting on his kitchen table to prepare meals from fridge            OT Problem List: Decreased strength;Decreased knowledge of use of DME or AE;Decreased activity tolerance;Decreased cognition;Impaired balance (sitting and/or standing);Decreased safety awareness;Impaired UE functional use      OT Treatment/Interventions: Self-care/ADL training;Therapeutic exercise;Patient/family education;Balance training;Energy conservation;Therapeutic activities;DME and/or AE instruction    OT Goals(Current goals can be found in the care plan section) Acute Rehab OT Goals Patient Stated Goal: go home OT Goal Formulation: With patient Time For Goal Achievement: 11/15/20 Potential to Achieve Goals: Good  OT Frequency: Min 2X/week   Barriers to D/C:            Co-evaluation              AM-PAC OT "6 Clicks" Daily Activity     Outcome Measure Help from another person eating meals?: A Little Help from another person taking care of personal grooming?: A Little Help from another person toileting, which includes using toliet, bedpan, or urinal?: A Lot Help from another person bathing (including  washing, rinsing, drying)?: A Lot Help from another person to put on and taking off regular upper body clothing?: A Little Help from another person to put on and taking off regular lower body clothing?: A Lot 6 Click Score: 15   End of Session Equipment Utilized During Treatment: Gait belt;Rolling walker Nurse Communication: Mobility status  Activity Tolerance: Patient tolerated  treatment well Patient left: in bed;with call bell/phone within reach;with bed alarm set  OT Visit Diagnosis: Unsteadiness on feet (R26.81);Other abnormalities of gait and mobility (R26.89);Muscle weakness (generalized) (M62.81);History of falling (Z91.81);Repeated falls (R29.6)                Time: 6808-8110 OT Time Calculation (min): 57 min Charges:  OT General Charges $OT Visit: 1 Visit OT Evaluation $OT Eval Moderate Complexity: 1 Mod OT Treatments $Self Care/Home Management : 38-52 mins  Zenovia Jarred, MSOT, OTR/L Acute Rehabilitation Services [facility] Office Number: [phone] Pager: [phone]  Zenovia Jarred 11/01/2020, 7:09 PM

## 2020-11-02 DIAGNOSIS — Z794 Long term (current) use of insulin: Secondary | ICD-10-CM

## 2020-11-02 DIAGNOSIS — I2510 Atherosclerotic heart disease of native coronary artery without angina pectoris: Secondary | ICD-10-CM

## 2020-11-02 DIAGNOSIS — E119 Type 2 diabetes mellitus without complications: Secondary | ICD-10-CM

## 2020-11-02 DIAGNOSIS — I10 Essential (primary) hypertension: Secondary | ICD-10-CM

## 2020-11-02 LAB — CBC
HCT: 31 % — ABNORMAL LOW (ref 39.0–52.0)
Hemoglobin: 10.8 g/dL — ABNORMAL LOW (ref 13.0–17.0)
MCH: 30.9 pg (ref 26.0–34.0)
MCHC: 34.8 g/dL (ref 30.0–36.0)
MCV: 88.8 fL (ref 80.0–100.0)
Platelets: 273 10*3/uL (ref 150–400)
RBC: 3.49 MIL/uL — ABNORMAL LOW (ref 4.22–5.81)
RDW: 13.3 % (ref 11.5–15.5)
WBC: 11.9 10*3/uL — ABNORMAL HIGH (ref 4.0–10.5)
nRBC: 0 % (ref 0.0–0.2)

## 2020-11-02 LAB — BASIC METABOLIC PANEL
Anion gap: 11 (ref 5–15)
BUN: 10 mg/dL (ref 8–23)
CO2: 25 mmol/L (ref 22–32)
Calcium: 9.3 mg/dL (ref 8.9–10.3)
Chloride: 95 mmol/L — ABNORMAL LOW (ref 98–111)
Creatinine, Ser: 0.84 mg/dL (ref 0.61–1.24)
GFR, Estimated: >60 mL/min (ref >60)
Glucose, Bld: 117 mg/dL — ABNORMAL HIGH (ref 70–99)
Potassium: 4.1 mmol/L (ref 3.5–5.1)
Sodium: 131 mmol/L — ABNORMAL LOW (ref 135–145)

## 2020-11-02 LAB — MAGNESIUM: Magnesium: 1.9 mg/dL (ref 1.7–2.4)

## 2020-11-02 LAB — GLUCOSE, CAPILLARY
Glucose-Capillary: 104 mg/dL — ABNORMAL HIGH (ref 70–99)
Glucose-Capillary: 118 mg/dL — ABNORMAL HIGH (ref 70–99)
Glucose-Capillary: 99 mg/dL (ref 70–99)
Glucose-Capillary: 103 mg/dL — ABNORMAL HIGH (ref 70–99)

## 2020-11-02 MED ORDER — LIDOCAINE 5 % EX PTCH
1.0000 | MEDICATED_PATCH | CUTANEOUS | Status: DC
  Administered 2020-11-03: 1 via TRANSDERMAL
  Filled 2020-11-02: qty 1

## 2020-11-02 NOTE — Progress Notes (Signed)
PROGRESS NOTE  Jorge Sherman PFX:[id number] DOB: [birth year] DOA: 10/29/2020 PCP: Merrilee Seashore, MD   LOS: 3 days   Brief narrative: As per HPI,  79 year old male with past medical history of hypertension, chronic pain syndrome, hyperlipidemia, BPH, depression, diabetes mellitus type 2 (diet controlled), coronary artery disease (cath with BMS placement in 2009), OSA (not compliant with CPAP), iron deficiency anemia, rheumatoid arthritis who presented to the hospital with complaint of severe right chest wall and flank plain status post fall while attempting to transfer from his bed to his electric wheelchair.Patient was on the ground for almost 1 hour since he was unable to get up.  He does help baseline diffuse joint issues and minimal ambulatory dysfunction.  He also complained of worsening pain over his joints for the last several weeks.  It was also history of falls for at least 4-5 times over the last 2 weeks or so.  Patient was then brought into the hospital. In the ED,  patient was found to be hyponatremic with sodium of 130 with marked leukocytosis of 21.1, severe hemoconcentration and dehydration.  Chest x-ray did not show any evidence of fracture. Due to patient's suspected volume depletion with electrolyte abnormalities, recurrent falls and ongoing intractable pain patient was considered for admission to the hospital.  Assessment/Plan:  Principal Problem:   Dehydration with hyponatremia Active Problems:   Type 2 diabetes mellitus without complication, with long-term current use of insulin (HCC)   Chronic pain syndrome   Essential hypertension, benign   Coronary artery disease involving native coronary artery of native heart without angina pectoris   Fall at home, initial encounter   Musculoskeletal chest pain   Mixed hyperlipidemia due to type 2 diabetes mellitus ([place])   Benign prostatic hyperplasia without lower urinary tract symptoms   Falls  Dehydration with  hyponatremia Secondary to poor oral take present admission.  Received IV fluid hydration.  Labs pending from today.  Latest sodium 2 days back was 133.  Recurrent falls at home.  Seen by physical therapy and recommended skilled nursing facility placement.  I have heard that the patient is unwilling to go to skilled nursing facility.  Might need home PT on discharge  Intractable chest wall pain secondary to fall.  EKG unremarkable.    Likely muscular.  On IV Toradol meloxicam Percocet and Lidoderm patch.  We will add additional Lidoderm patch diabetes palpated.  Discontinue Mobic for now.  Pain control has been an issue.  Patient is already on Cymbalta.  Continue gabapentin, muscle relaxant.    Type 2 diabetes mellitus  Diet controlled at home.  Continue diabetic diet, sliding scale insulin while in hospital.  Latest POC glucose of 104    Chronic pain syndrome. Secondary to rheumatoid arthritis.  Not on any narcotics at this time.  Used to be on Suboxone and oxycodone in the past.  Currently on as needed narcotics due to severe pain.  Still complains of severe pain requesting stronger medication.  Added Lidoderm patch.    Essential hypertension, benign Continue clonidine, amlodipine, tamsulosin.  Closely monitor blood pressure. Blood pressure seems to be slightly higher but likely secondary to pain    Coronary artery disease  Continue statins.  Has been started on aspirin.   Will consider aspirin on discharge  Mixed hyperlipidemia  Continue statin therapy   Benign prostatic hyperplasia without lower urinary tract symptoms  Continue Flomax   DVT prophylaxis: enoxaparin (LOVENOX) injection 40 mg Start: 10/29/20 2200   Code Status: DNR  Family Communication: None today.   Status is: Patient  The patient  is inpatient because: Persistent severe electrolyte disturbances, IV treatments appropriate due to intensity of illness or inability to take PO and Inpatient level of care  appropriate due to severity of illness, unsafe disposition, frequent falls.  Dispo: The patient is from: Home              Anticipated d/c is to: Skilled nursing facility as per PT evaluation patient refusing it.  Likely home with home health by tomorrow pain is better controlled              Anticipated d/c date is: 1 day              Patient currently is not medically stable to d/c.   Consultants:  None  Procedures:  None  Antibiotics:  . None  Anti-infectives (From admission, onward)   Start     Dose/Rate Route Frequency Ordered Stop   10/29/20 2200  hydroxychloroquine (PLAQUENIL) tablet 200 mg        200 mg Oral 2 times daily 10/29/20 2015       Subjective: Today, patient was seen and examined at bedside.  Complains of right chest wall pain, requesting for stronger pain medication.   Objective: Vitals:   11/02/20 0844 11/02/20 0941  BP: (!) 161/81 (!) 143/83  Pulse:  84  Resp:  20  Temp:  98.5 F (36.9 C)  SpO2:  99%    Intake/Output Summary (Last 24 hours) at 11/02/2020 1037 Last data filed at 11/02/2020 0900 Gross per 24 hour  Intake 240 ml  Output 700 ml  Net -460 ml   Filed Weights   10/29/20 2134 10/31/20 2101 11/01/20 2057  Weight: 116.6 kg 116.6 kg 116.6 kg   Body mass index is 35.85 kg/m.   Physical Exam: General: Obese built,  anxious, alert awake and communicative HENT:   No scleral pallor or icterus noted. Oral mucosa is moist.  Chest: Right chest wall tenderness on palpation.. No crackles or wheezes.  CVS: S1 &S2 heard. No murmur.  Regular rate and rhythm. Abdomen: Soft, nontender, nondistended.  Bowel sounds are heard.   Extremities: No cyanosis, clubbing or edema.  Peripheral pulses are palpable. Psych: Alert, awake and oriented, normal mood CNS:  No cranial nerve deficits.  Power equal in all extremities.   Skin: Warm and dry.  No rashes noted.  Data Review: I have personally reviewed the following laboratory data and  studies,  CBC: Recent Labs  Lab 10/29/20 1054 10/30/20 0510 10/31/20 1001  WBC 21.1* 15.2* 14.0*  NEUTROABS 16.4* 11.3*  --   HGB 13.2 11.5* 9.9*  HCT 40.7 35.3* 30.6*  MCV 91.9 91.7 92.4  PLT 443* 292 326   Basic Metabolic Panel: Recent Labs  Lab 10/29/20 1054 10/30/20 0510 10/31/20 1001  NA 130* 129* 133*  K 4.4 4.4 4.3  CL 91* 95* 102  CO2 26 25 25   GLUCOSE 101* 101* 163*  BUN 14 17 15   CREATININE 0.79 1.01 0.84  CALCIUM 9.7 8.7* 8.6*  MG  --  2.0 1.8  PHOS  --   --  3.2   Liver Function Tests: Recent Labs  Lab 10/29/20 1054 10/30/20 0510 10/31/20 1001  AST 26 20 15   ALT 22 17 15   ALKPHOS 62 53 44  BILITOT 0.5 0.6 0.5  PROT 7.0 5.7* 5.3*  ALBUMIN 4.1 3.3* 2.9*   No results for input(s): LIPASE, AMYLASE in the last  168 hours. No results for input(s): AMMONIA in the last 168 hours. Cardiac Enzymes: Recent Labs  Lab 10/29/20 2059  CKTOTAL 65   BNP (last 3 results) No results for input(s): BNP in the last 8760 hours.  ProBNP (last 3 results) No results for input(s): PROBNP in the last 8760 hours.  CBG: Recent Labs  Lab 11/01/20 0756 11/01/20 1129 11/01/20 1611 11/01/20 2058 11/02/20 0652  GLUCAP 101* 122* 104* 121* 104*   Recent Results (from the past 240 hour(s))  Resp Panel by RT-PCR (Flu A&B, Covid) Nasopharyngeal Swab     Status: None   Collection Time: 10/29/20  8:06 PM   Specimen: Nasopharyngeal Swab; Nasopharyngeal(NP) swabs in vial transport medium  Result Value Ref Range Status   SARS Coronavirus 2 by RT PCR NEGATIVE NEGATIVE Final    Comment: (NOTE) SARS-CoV-2 target nucleic acids are NOT DETECTED.  The SARS-CoV-2 RNA is generally detectable in upper respiratory specimens during the acute phase of infection. The lowest concentration of SARS-CoV-2 viral copies this assay can detect is 138 copies/mL. A negative result does not preclude SARS-Cov-2 infection and should not be used as the sole basis for treatment or other patient  management decisions. A negative result may occur with  improper specimen collection/handling, submission of specimen other than nasopharyngeal swab, presence of viral mutation(s) within the areas targeted by this assay, and inadequate number of viral copies(<138 copies/mL). A negative result must be combined with clinical observations, patient history, and epidemiological information. The expected result is Negative.  Fact Sheet for Patients:  EntrepreneurPulse.com.au  Fact Sheet for Healthcare Providers:  IncredibleEmployment.be  This test is no t yet approved or cleared by the [place] FDA and  has been authorized for detection and/or diagnosis of SARS-CoV-2 by FDA under an Emergency Use Authorization (EUA). This EUA will remain  in effect (meaning this test can be used) for the duration of the COVID-19 declaration under Section 564(b)(1) of the Act, 21 U.S.C.section 360bbb-3(b)(1), unless the authorization is terminated  or revoked sooner.       Influenza A by PCR NEGATIVE NEGATIVE Final   Influenza B by PCR NEGATIVE NEGATIVE Final    Comment: (NOTE) The Xpert Xpress SARS-CoV-2/FLU/RSV plus assay is intended as an aid in the diagnosis of influenza from Nasopharyngeal swab specimens and should not be used as a sole basis for treatment. Nasal washings and aspirates are unacceptable for Xpert Xpress SARS-CoV-2/FLU/RSV testing.  Fact Sheet for Patients: EntrepreneurPulse.com.au  Fact Sheet for Healthcare Providers: IncredibleEmployment.be  This test is not yet approved or cleared by the [place] FDA and has been authorized for detection and/or diagnosis of SARS-CoV-2 by FDA under an Emergency Use Authorization (EUA). This EUA will remain in effect (meaning this test can be used) for the duration of the COVID-19 declaration under Section 564(b)(1) of the Act, 21 U.S.C. section 360bbb-3(b)(1),  unless the authorization is terminated or revoked.  Performed at [facility], [place] [address] [place], [place] [address]   Culture, Urine     Status: Abnormal   Collection Time: 10/29/20  8:43 PM   Specimen: Urine, Random  Result Value Ref Range Status   Specimen Description URINE, RANDOM  Final   Special Requests   Final    NONE Performed at [facility], [place] [address] [place], [place] [address]    Culture MULTIPLE SPECIES PRESENT, SUGGEST RECOLLECTION (A)  Final   Report Status 10/31/2020 FINAL  Final  Culture, blood (routine x 2)  Status: None (Preliminary result)   Collection Time: 10/29/20 10:05 PM   Specimen: BLOOD LEFT HAND  Result Value Ref Range Status   Specimen Description BLOOD LEFT HAND  Final   Special Requests   Final    BOTTLES DRAWN AEROBIC AND ANAEROBIC Blood Culture results may not be optimal due to an excessive volume of blood received in culture bottles   Culture   Final    NO GROWTH 2 DAYS Performed at [facility], [address] [place], [place] [address]    Report Status PENDING  Incomplete  Culture, blood (routine x 2)     Status: None (Preliminary result)   Collection Time: 10/29/20 10:05 PM   Specimen: BLOOD RIGHT HAND  Result Value Ref Range Status   Specimen Description BLOOD RIGHT HAND  Final   Special Requests   Final    BOTTLES DRAWN AEROBIC AND ANAEROBIC Blood Culture results may not be optimal due to an excessive volume of blood received in culture bottles   Culture   Final    NO GROWTH 2 DAYS Performed at [facility], [place] [address] [place], [place] [address]    Report Status PENDING  Incomplete     Studies: No results found.    Flora Lipps, MD  [facility] 11/02/2020

## 2020-11-02 NOTE — Plan of Care (Signed)
  Problem: Safety: Goal: Ability to remain free from injury will improve Outcome: Progressing   

## 2020-11-02 NOTE — Progress Notes (Signed)
PT Cancellation Note  Patient Details Name: Jorge Sherman MRN: [id number] DOB: Feb 26, 1941   Cancelled Treatment:    Reason Eval/Treat Not Completed: Patient declined, no reason specified patient refuses OOB and bed level exercises- states he didn't sleep much last night and doesn't want to move due to having diarrhea every time he puts "pressure on my stomach". Will attempt to try back if time/schedule allow.   Windell Norfolk, DPT, PN1   Supplemental Physical Therapist [facility]    Pager [phone] Acute Rehab Office [phone]

## 2020-11-03 LAB — GLUCOSE, CAPILLARY
Glucose-Capillary: 98 mg/dL (ref 70–99)
Glucose-Capillary: 132 mg/dL — ABNORMAL HIGH (ref 70–99)

## 2020-11-03 MED ORDER — OXYCODONE-ACETAMINOPHEN 5-325 MG PO TABS: 1.0000 | ORAL_TABLET | Freq: Four times a day (QID) | ORAL | 0 refills | Status: DC | PRN

## 2020-11-03 MED ORDER — ALBUTEROL SULFATE HFA 108 (90 BASE) MCG/ACT IN AERS: 2.0000 | INHALATION_SPRAY | RESPIRATORY_TRACT | 0 refills | Status: AC | PRN

## 2020-11-03 MED ORDER — POLYETHYLENE GLYCOL 3350 17 G PO PACK: 17.0000 g | PACK | Freq: Every day | ORAL | 0 refills | Status: AC | PRN

## 2020-11-03 MED ORDER — LIDOCAINE 5 % EX PTCH: 1.0000 | MEDICATED_PATCH | CUTANEOUS | 1 refills | Status: DC

## 2020-11-03 MED ORDER — KETOROLAC TROMETHAMINE 15 MG/ML IJ SOLN
15.0000 mg | Freq: Once | INTRAMUSCULAR | Status: AC
  Administered 2020-11-03: 15 mg via INTRAVENOUS
  Filled 2020-11-03: qty 1

## 2020-11-03 MED ORDER — CELECOXIB 200 MG PO CAPS
200.0000 mg | ORAL_CAPSULE | Freq: Two times a day (BID) | ORAL | Status: DC
  Administered 2020-11-03: 200 mg via ORAL
  Filled 2020-11-03 (×2): qty 1

## 2020-11-03 NOTE — Discharge Summary (Signed)
Physician Discharge Summary  Jorge Sherman VHQ:[id number] DOB: [birth year] DOA: 10/29/2020  PCP: Merrilee Seashore, MD  Admit date: 10/29/2020 Discharge date: 11/03/2020  Admitted From: Home  Discharge disposition: Home with PT   Recommendations for Outpatient Follow-Up:   . Follow up with your primary care provider in one week.  . Check CBC, BMP, magnesium in the next visit . Discuss with your pain management clinic in 1 week for further chronic pain management   Discharge Diagnosis:   Principal Problem:   Dehydration with hyponatremia Active Problems:   Type 2 diabetes mellitus without complication, with long-term current use of insulin (HCC)   Chronic pain syndrome   Essential hypertension, benign   Coronary artery disease involving native coronary artery of native heart without angina pectoris   Fall at home, initial encounter   Musculoskeletal chest pain   Mixed hyperlipidemia due to type 2 diabetes mellitus ([place])   Benign prostatic hyperplasia without lower urinary tract symptoms   Falls   Discharge Condition: Improved.  Diet recommendation: Low sodium, heart healthy.  Carbohydrate-modified.    Wound care: None.  Code status: DNR   History of Present Illness:   79 year old male with past medical history of hypertension, chronic pain syndrome, hyperlipidemia, BPH, depression, diabetes mellitus type 2 (diet controlled), coronary artery disease (cath with BMS placement in 2009), OSA (not compliant with CPAP), iron deficiency anemia, rheumatoid arthritis who presented to the hospital with complaint of severe right chest wall and flank plain status post fall while attempting to transfer from his bed to his electric wheelchair.Patient was on the ground for almost 1 hour since he was unable to get up.  He does help baseline diffuse joint issues and minimal ambulatory dysfunction.  He also complained of worsening pain over his joints for the last several weeks.  It was  also history of falls for at least 4-5 times over the last 2 weeks or so.  Patient was then brought into the hospital. In the ED,  patient was found to be hyponatremic with sodium of 130 with marked leukocytosis of 21.1, severe hemoconcentration and dehydration.  Chest x-ray did not show any evidence of fracture. Due to patient's suspected volume depletion with electrolyte abnormalities, recurrent falls and ongoing intractable pain patient was considered for admission to the hospital.  Hospital Course:   Following conditions were addressed during hospitalization as listed below,  Dehydration with hyponatremia Secondary to poor oral intake, present admission.  Received IV fluid hydration.    Improved.  Sodium of 131 at this time.  Will need BMP as outpatient.  Recurrent falls at home.  Seen by physical therapy and recommended skilled nursing facility placement however patient s unwilling to go to skilled nursing facility.    Will arrange for home health PT OT on discharge.  Spoke at the interdisciplinary meeting.  chest wall pain secondary to fall.  EKG unremarkable.    Likely muscular.  Patient received IV Toradol meloxicam Percocet and Lidoderm patch in hospitalization.  We will add additional Lidoderm patch on discharge.  Patient is already on Cymbalta, gabapentin, Mobic at home.  We will continue with that.  Patient was advised to follow-up with pain management clinic as outpatient.     Type 2 diabetes mellitus  Diet controlled at home.  Continue diabetic diet on discharge.    Chronic pain syndrome. Secondary to rheumatoid arthritis.  Not on any narcotics at this time and used to be on Suboxone and oxycodone in the past.  Prescribed short course of oxycodone on discharge but encouraged to follow-up with pain clinic as outpatient.     Essential hypertension, benign Continue clonidine, amlodipine, tamsulosin on discharge.    History of coronary artery disease   Continue statins.    Not on  aspirin at home but patient takes Mobic as well.   Mixed hyperlipidemia   Continue statin therapy    Benign prostatic hyperplasia without lower urinary tract symptoms  Continue Flomax   Disposition.  At this time, patient is stable for disposition home with home health PT, OT.  I tried to call the patient's daughter on the phone today but was unable to reach her.  Medical Consultants:    None.  Procedures:    None Subjective:   Today, patient seen and examined at bedside.  Complains of mild chest wall pain.  Denies any shortness of breath, fever, nausea, vomiting  Discharge Exam:   Vitals:   11/03/20 0422 11/03/20 0923  BP: (!) 143/84 (!) 157/77  Pulse: 87 84  Resp: 18 20  Temp: 98.3 F (36.8 C) 98.4 F (36.9 C)  SpO2: 94% 96%   Vitals:   11/02/20 1633 11/02/20 2013 11/03/20 0422 11/03/20 0923  BP: 139/77 (!) 137/94 (!) 143/84 (!) 157/77  Pulse: 87 95 87 84  Resp: 18 18 18 20   Temp: 98.7 F (37.1 C) 99 F (37.2 C) 98.3 F (36.8 C) 98.4 F (36.9 C)  TempSrc: Oral Oral Oral Oral  SpO2: 95% 94% 94% 96%  Weight:  115.7 kg    Height:       General: Alert awake, not in obvious distress lying flat in bed, obese HENT: pupils equally reacting to light,  No scleral pallor or icterus noted. Oral mucosa is moist.  Chest:   Diminished breath sounds bilaterally. No crackles or wheezes.  Mild right sided chest wall tenderness. CVS: S1 &S2 heard. No murmur.  Regular rate and rhythm. Abdomen: Soft, nontender, nondistended.  Bowel sounds are heard.   Extremities: No cyanosis, clubbing or edema.  Peripheral pulses are palpable. Psych: Alert, awake and oriented, normal mood CNS:  No cranial nerve deficits.  Power equal in all extremities.   Skin: Warm and dry.  No rashes noted.  The results of significant diagnostics from this hospitalization (including imaging, microbiology, ancillary and laboratory) are listed below for reference.     Diagnostic Studies:   DG Ribs  Unilateral W/Chest Right  Result Date: 10/29/2020 CLINICAL DATA:  [place] yesterday, RIGHT shoulder and rib pain EXAM: RIGHT RIBS AND CHEST - 3+ VIEW COMPARISON:  Chest radiograph 06/27/2020 FINDINGS: Normal heart size, mediastinal contours, and pulmonary vascularity. Slight elevation RIGHT diaphragm unchanged. No infiltrate, pleural effusion or pneumothorax. Diffuse osseous demineralization. Degenerative changes RIGHT AC joint. Mild RIGHT glenohumeral degenerative changes. No rib fracture or bone destruction. IMPRESSION: No acute abnormalities. Degenerative changes at RIGHT glenohumeral and acromioclavicular joints. Electronically Signed   By: Lavonia Dana M.D.   On: 10/29/2020 11:22   DG Shoulder Right  Result Date: 10/29/2020 CLINICAL DATA:  Fall EXAM: RIGHT SHOULDER - 2+ VIEW COMPARISON:  None. FINDINGS: There is alignment at the glenohumeral joint. No acute fracture. Degenerative changes are present at the glenohumeral joint. High-riding humeral head likely reflects prior rotator cuff tear. There is chronic widening of the acromioclavicular joint with degenerative change. IMPRESSION: No acute fracture or malalignment. Electronically Signed   By: Macy Mis M.D.   On: 10/29/2020 11:21   CT Head Wo Contrast  Result Date: 10/29/2020  CLINICAL DATA:  Fall EXAM: CT HEAD WITHOUT CONTRAST TECHNIQUE: Contiguous axial images were obtained from the base of the skull through the vertex without intravenous contrast. COMPARISON:  October 2020 FINDINGS: Brain: There is no acute intracranial hemorrhage, mass effect, or edema. Gray-white differentiation is preserved. There is no extra-axial fluid collection. Prominence of the ventricles and sulci reflects stable parenchymal volume loss. Patchy hypoattenuation in the supratentorial white matter is nonspecific but probably reflects stable chronic microvascular ischemic changes. Vascular: There is atherosclerotic calcification at the skull base. Skull: Calvarium is  unremarkable. Sinuses/Orbits: Minor mucosal thickening. Bilateral lens replacements. Other: None. IMPRESSION: No evidence of acute intracranial injury. Electronically Signed   By: Macy Mis M.D.   On: 10/29/2020 11:33   CT Chest Wo Contrast  Result Date: 10/29/2020 CLINICAL DATA:  Right-sided rib pain after fall yesterday. EXAM: CT CHEST WITHOUT CONTRAST TECHNIQUE: Multidetector CT imaging of the chest was performed following the standard protocol without IV contrast. COMPARISON:  May 10, 2019. FINDINGS: Cardiovascular: Atherosclerosis of thoracic aorta is noted without aneurysm formation. Normal cardiac size. No pericardial effusion. Coronary artery calcifications are noted suggesting coronary artery disease. Mediastinum/Nodes: No enlarged mediastinal or axillary lymph nodes. Thyroid gland, trachea, and esophagus demonstrate no significant findings. Lungs/Pleura: No pneumothorax or pleural effusion is noted. Stable bibasilar and right upper lobe scarring is noted. No definite acute abnormality is noted. Upper Abdomen: Minimal cholelithiasis is noted. Musculoskeletal: No fracture is seen. IMPRESSION: 1. Coronary artery calcifications are noted suggesting coronary artery disease. 2. Stable bibasilar and right upper lobe scarring is noted. 3. Minimal cholelithiasis is noted. 4. Aortic atherosclerosis. Aortic Atherosclerosis (ICD10-I70.0). Electronically Signed   By: Marijo Conception M.D.   On: 10/29/2020 13:15     Labs:   Basic Metabolic Panel: Recent Labs  Lab 10/29/20 1054 10/30/20 0510 10/31/20 1001 11/02/20 1155  NA 130* 129* 133* 131*  K 4.4 4.4 4.3 4.1  CL 91* 95* 102 95*  CO2 26 25 25 25   GLUCOSE 101* 101* 163* 117*  BUN 14 17 15 10   CREATININE 0.79 1.01 0.84 0.84  CALCIUM 9.7 8.7* 8.6* 9.3  MG  --  2.0 1.8 1.9  PHOS  --   --  3.2  --    GFR Estimated Creatinine Clearance: 92.3 mL/min (by C-G formula based on SCr of 0.84 mg/dL). Liver Function Tests: Recent Labs  Lab  10/29/20 1054 10/30/20 0510 10/31/20 1001  AST 26 20 15   ALT 22 17 15   ALKPHOS 62 53 44  BILITOT 0.5 0.6 0.5  PROT 7.0 5.7* 5.3*  ALBUMIN 4.1 3.3* 2.9*   No results for input(s): LIPASE, AMYLASE in the last 168 hours. No results for input(s): AMMONIA in the last 168 hours. Coagulation profile No results for input(s): INR, PROTIME in the last 168 hours.  CBC: Recent Labs  Lab 10/29/20 1054 10/30/20 0510 10/31/20 1001 11/02/20 1155  WBC 21.1* 15.2* 14.0* 11.9*  NEUTROABS 16.4* 11.3*  --   --   HGB 13.2 11.5* 9.9* 10.8*  HCT 40.7 35.3* 30.6* 31.0*  MCV 91.9 91.7 92.4 88.8  PLT 443* 292 284 273   Cardiac Enzymes: Recent Labs  Lab 10/29/20 2059  CKTOTAL 65   BNP: Invalid input(s): POCBNP CBG: Recent Labs  Lab 11/02/20 0652 11/02/20 1125 11/02/20 1632 11/02/20 2144 11/03/20 0633  GLUCAP 104* 118* 99 103* 98   D-Dimer No results for input(s): DDIMER in the last 72 hours. Hgb A1c No results for input(s): HGBA1C in the last 72  hours. Lipid Profile No results for input(s): CHOL, HDL, LDLCALC, TRIG, CHOLHDL, LDLDIRECT in the last 72 hours. Thyroid function studies No results for input(s): TSH, T4TOTAL, T3FREE, THYROIDAB in the last 72 hours.  Invalid input(s): FREET3 Anemia work up No results for input(s): VITAMINB12, FOLATE, FERRITIN, TIBC, IRON, RETICCTPCT in the last 72 hours. Microbiology Recent Results (from the past 240 hour(s))  Resp Panel by RT-PCR (Flu A&B, Covid) Nasopharyngeal Swab     Status: None   Collection Time: 10/29/20  8:06 PM   Specimen: Nasopharyngeal Swab; Nasopharyngeal(NP) swabs in vial transport medium  Result Value Ref Range Status   SARS Coronavirus 2 by RT PCR NEGATIVE NEGATIVE Final    Comment: (NOTE) SARS-CoV-2 target nucleic acids are NOT DETECTED.  The SARS-CoV-2 RNA is generally detectable in upper respiratory specimens during the acute phase of infection. The lowest concentration of SARS-CoV-2 viral copies this assay can  detect is 138 copies/mL. A negative result does not preclude SARS-Cov-2 infection and should not be used as the sole basis for treatment or other patient management decisions. A negative result may occur with  improper specimen collection/handling, submission of specimen other than nasopharyngeal swab, presence of viral mutation(s) within the areas targeted by this assay, and inadequate number of viral copies(<138 copies/mL). A negative result must be combined with clinical observations, patient history, and epidemiological information. The expected result is Negative.  Fact Sheet for Patients:  EntrepreneurPulse.com.au  Fact Sheet for Healthcare Providers:  IncredibleEmployment.be  This test is no t yet approved or cleared by the [place] FDA and  has been authorized for detection and/or diagnosis of SARS-CoV-2 by FDA under an Emergency Use Authorization (EUA). This EUA will remain  in effect (meaning this test can be used) for the duration of the COVID-19 declaration under Section 564(b)(1) of the Act, 21 U.S.C.section 360bbb-3(b)(1), unless the authorization is terminated  or revoked sooner.       Influenza A by PCR NEGATIVE NEGATIVE Final   Influenza B by PCR NEGATIVE NEGATIVE Final    Comment: (NOTE) The Xpert Xpress SARS-CoV-2/FLU/RSV plus assay is intended as an aid in the diagnosis of influenza from Nasopharyngeal swab specimens and should not be used as a sole basis for treatment. Nasal washings and aspirates are unacceptable for Xpert Xpress SARS-CoV-2/FLU/RSV testing.  Fact Sheet for Patients: EntrepreneurPulse.com.au  Fact Sheet for Healthcare Providers: IncredibleEmployment.be  This test is not yet approved or cleared by the [place] FDA and has been authorized for detection and/or diagnosis of SARS-CoV-2 by FDA under an Emergency Use Authorization (EUA). This EUA will remain in  effect (meaning this test can be used) for the duration of the COVID-19 declaration under Section 564(b)(1) of the Act, 21 U.S.C. section 360bbb-3(b)(1), unless the authorization is terminated or revoked.  Performed at [facility], [place] [address], [place], [place] [address]   Culture, Urine     Status: Abnormal   Collection Time: 10/29/20  8:43 PM   Specimen: Urine, Random  Result Value Ref Range Status   Specimen Description URINE, RANDOM  Final   Special Requests   Final    NONE Performed at [facility], [place] [address], [place], [place] [address]    Culture MULTIPLE SPECIES PRESENT, SUGGEST RECOLLECTION (A)  Final   Report Status 10/31/2020 FINAL  Final  Culture, blood (routine x 2)     Status: None (Preliminary result)   Collection Time: 10/29/20 10:05 PM   Specimen: BLOOD LEFT HAND  Result  Value Ref Range Status   Specimen Description BLOOD LEFT HAND  Final   Special Requests   Final    BOTTLES DRAWN AEROBIC AND ANAEROBIC Blood Culture results may not be optimal due to an excessive volume of blood received in culture bottles   Culture   Final    NO GROWTH 3 DAYS Performed at [facility], [place] [address] [place], [place] [address]    Report Status PENDING  Incomplete  Culture, blood (routine x 2)     Status: None (Preliminary result)   Collection Time: 10/29/20 10:05 PM   Specimen: BLOOD RIGHT HAND  Result Value Ref Range Status   Specimen Description BLOOD RIGHT HAND  Final   Special Requests   Final    BOTTLES DRAWN AEROBIC AND ANAEROBIC Blood Culture results may not be optimal due to an excessive volume of blood received in culture bottles   Culture   Final    NO GROWTH 3 DAYS Performed at [facility], [place] [address], [place], [place] [address]    Report Status PENDING  Incomplete     Discharge Instructions:   Discharge Instructions     Diet - low sodium heart healthy   Complete by: As directed    Diet Carb Modified   Complete by:  As directed    Discharge instructions   Complete by: As directed    Follow-up with your primary care provider in 1 week.  Continue to take medications as prescribed.  Follow-up with your pain management clinic in 1 week for better pain control.  Use a warm compress on the right chest wall.  You have been prescribed a Lidoderm patch to apply in the right chest wall.   Increase activity slowly   Complete by: As directed       Allergies as of 11/03/2020       Reactions   Cefepime Rash   Other reaction(s): Other (See Comments), redness   Iohexol Hives, Other (See Comments)   A few hives post CT Other reaction(s): hives, few, post CT   Fentanyl Itching, Nausea And Vomiting, Other (See Comments)   Patient states that it happened over 30 yrs ago   Iodine Rash   Other reaction(s): Hives/Skin Rash        Medication List     STOP taking these medications    ciprofloxacin 500 MG tablet Commonly known as: CIPRO   ketorolac 10 MG tablet Commonly known as: TORADOL   Oxycodone HCl 20 MG Tabs       TAKE these medications    acetaminophen 325 MG tablet Commonly known as: TYLENOL Take 2 tablets (650 mg total) by mouth every 6 (six) hours as needed for mild pain (or Fever >/= 101).   albuterol 108 (90 Base) MCG/ACT inhaler Commonly known as: VENTOLIN HFA Inhale 2 puffs into the lungs every 4 (four) hours as needed for wheezing or shortness of breath.   amLODipine 2.5 MG tablet Commonly known as: NORVASC Take 2.5 mg by mouth daily.   celecoxib 200 MG capsule Commonly known as: CELEBREX Take 200 mg by mouth 2 (two) times daily.   cloNIDine 0.1 MG tablet Commonly known as: CATAPRES Take 0.1 mg by mouth 2 (two) times daily.   diclofenac Sodium 1 % Gel Commonly known as: VOLTAREN Apply 2 g topically 4 (four) times daily as needed for pain.   DULoxetine 30 MG capsule Commonly known as: CYMBALTA Take 30 mg by mouth 3 (three) times  daily.   ferrous sulfate 325 (65 FE) MG  tablet Take 325 mg by mouth daily.   furosemide 20 MG tablet Commonly known as: LASIX Take 20 mg by mouth daily as needed.   FUSION PLUS PO Take 1 capsule by mouth daily.   gabapentin 800 MG tablet Commonly known as: NEURONTIN Take 800 mg by mouth 3 (three) times daily.   hydroxychloroquine 200 MG tablet Commonly known as: PLAQUENIL Take 200 mg by mouth 2 (two) times daily.   ipratropium-albuterol 0.5-2.5 (3) MG/3ML Soln Commonly known as: DUONEB SMARTSIG:1 Vial(s) Via Nebulizer PRN   lidocaine 5 % Commonly known as: LIDODERM Place 1 patch onto the skin daily. Remove & Discard patch within 12 hours or as directed by MD. Right chest   Lidocaine-Menthol 3.6-1.25 % Ptch Place 1 patch onto the skin daily as needed (shoulder pain).   linaclotide 72 MCG capsule Commonly known as: LINZESS Take 72 mcg by mouth daily as needed (constipation).   loperamide 2 MG tablet Commonly known as: Imodium A-D Take 1 tablet (2 mg total) by mouth 4 (four) times daily as needed for diarrhea or loose stools.   LORazepam 1 MG tablet Commonly known as: ATIVAN Take 2 tablets (2 mg total) by mouth every 8 (eight) hours as needed for anxiety. What changed: how much to take   methocarbamol 500 MG tablet Commonly known as: ROBAXIN Take 1,000 mg by mouth 3 (three) times daily.   Narcan 4 MG/0.1ML Liqd nasal spray kit Generic drug: naloxone Place 1 spray into the nose once.   omeprazole 40 MG capsule Commonly known as: PRILOSEC Take 40 mg by mouth daily.   ondansetron 4 MG tablet Commonly known as: ZOFRAN Take 1 tablet (4 mg total) by mouth every 6 (six) hours as needed for nausea.   oxyCODONE-acetaminophen 5-325 MG tablet Commonly known as: PERCOCET/ROXICET Take 1 tablet by mouth every 6 (six) hours as needed for up to 20 doses for moderate pain or severe pain.   polyethylene glycol 17 g packet Commonly known as: MIRALAX / GLYCOLAX Take 17 g by mouth daily as needed for mild  constipation.   rosuvastatin 40 MG tablet Commonly known as: CRESTOR Take 40 mg by mouth every evening.   saccharomyces boulardii 250 MG capsule Commonly known as: FLORASTOR Take 1 capsule (250 mg total) by mouth 2 (two) times daily.   tamsulosin 0.4 MG Caps capsule Commonly known as: FLOMAX Take 0.4 mg by mouth daily.        Follow-up Information     Merrilee Seashore, MD.   Specialty: Internal Medicine Contact information: [address] [place] [address] [phone]         Care, [facility] Follow up.   Specialty: [place] Why: the office will call to schedule home health therapy visits Contact information: [place] [address] [phone]                  Time coordinating discharge: 39 minutes  Signed:  Ilham Roughton  [facility] 11/03/2020, 10:07 AM

## 2020-11-03 NOTE — Progress Notes (Signed)
Occupational Therapy Treatment Patient Details Name: Jorge Sherman MRN: [id number] DOB: December 29, 1940 Today's Date: 11/02/2020    History of present illness 79 year old male with past medical history of hypertension, chronic pain syndrome, hyperlipidemia, BPH, depression, diabetes mellitus type 2 (diet controlled), coronary artery disease (cath with BMS placement in 2009), OSA (not compliant with CPAP), iron deficiency anemia, rheumatoid arthritis who presents to [facility] emergency department with complaints of severe right chest wall and flank pain after a fall. Imaging negative for fracture.   OT comments  *delayed entry. Pt seen 12/13. Pt reporting to be having a bad day.  Pt limited by increased pain throughout any exertion, decreased ability to care for self and decreased strength. Pericare performed as pt incontinent lying in bed with multiple BMs.  Pt minA for bed mobility tasks. Rn aware of pain and multiple BMs. B/L shoulders with pain- warm blanket wrapped around patient. Pt unaware of safety deficits and continues to want to go home. OT to continue to follow.     Follow Up Recommendations  SNF;Home health OT;Supervision/Assistance - 24 hour (pt may refuse SNF)    Equipment Recommendations  None recommended by OT    Recommendations for Other Services      Precautions / Restrictions Precautions Precautions: Fall Precaution Comments: severe bilateral shoulder pain, pt states "my shoulders are broken" Restrictions Weight Bearing Restrictions: No       Mobility Bed Mobility Overal bed mobility: Needs Assistance Bed Mobility: Rolling Rolling: Min assist   Supine to sit: Min assist Sit to supine: Min assist   General bed mobility comments: MinA with use of rails; pt sat EOB <2 mins to due to fatigue after pericare.  Transfers                 General transfer comment: bed level s/p day full of pain and multiple BMs.    Balance Overall balance  assessment: History of Falls;Needs assistance   Sitting balance-Leahy Scale: Fair                                     ADL either performed or assessed with clinical judgement   ADL Overall ADL's : Needs assistance/impaired                             Toileting- Clothing Manipulation and Hygiene: Total assistance;Bed level Toileting - Clothing Manipulation Details (indicate cue type and reason): Pt stating "I had a bowel movement and no one is helping me." "I am miserable here."     Functional mobility during ADLs: Minimal assistance;Rolling walker;Cueing for sequencing;Cueing for safety General ADL Comments: Pt limited by increased pain throughout any exertion, decreased ability to care for self and decreased strength.     Vision   Vision Assessment?: No apparent visual deficits   Perception     Praxis      Cognition Arousal/Alertness: Awake/alert Behavior During Therapy: Restless Overall Cognitive Status: Impaired/Different from baseline Area of Impairment: Safety/judgement;Problem solving;Memory                     Memory: Decreased short-term memory Following Commands: Follows one step commands consistently Safety/Judgement: Decreased awareness of safety;Decreased awareness of deficits   Problem Solving: Requires verbal cues General Comments: shows decreased awareness of deficits/safety.        Exercises     Shoulder  Instructions       General Comments VSS on RA    Pertinent Vitals/ Pain       Pain Assessment: Faces Faces Pain Scale: Hurts little more Pain Location: bil shoulders Pain Descriptors / Indicators: Sore;Discomfort;Grimacing Pain Intervention(s): Monitored during session;Repositioned  Home Living                                          Prior Functioning/Environment              Frequency  Min 2X/week        Progress Toward Goals  OT Goals(current goals can now be found in  the care plan section)  Progress towards OT goals: Progressing toward goals  Acute Rehab OT Goals Patient Stated Goal: go home OT Goal Formulation: With patient Time For Goal Achievement: 11/15/20 Potential to Achieve Goals: Good ADL Goals Pt Will Perform Lower Body Bathing: with min assist;sitting/lateral leans;sit to/from stand;with adaptive equipment Pt Will Perform Lower Body Dressing: with min assist;sitting/lateral leans;sit to/from stand;with adaptive equipment Pt Will Transfer to Toilet: with min guard assist;stand pivot transfer;bedside commode Additional ADL Goal #1: Pt will recall at least 3 fall prevention strategies in current ADL environment to improve safety  Plan Discharge plan remains appropriate    Co-evaluation                 AM-PAC OT "6 Clicks" Daily Activity     Outcome Measure   Help from another person eating meals?: A Little Help from another person taking care of personal grooming?: A Little Help from another person toileting, which includes using toliet, bedpan, or urinal?: A Lot Help from another person bathing (including washing, rinsing, drying)?: A Lot Help from another person to put on and taking off regular upper body clothing?: A Little Help from another person to put on and taking off regular lower body clothing?: A Lot 6 Click Score: 15    End of Session    OT Visit Diagnosis: Muscle weakness (generalized) (M62.81);Pain;Other symptoms and signs involving cognitive function   Activity Tolerance Patient tolerated treatment well   Patient Left in bed;with call bell/phone within reach;with bed alarm set   Nurse Communication Mobility status        Time: 1332-1400 OT Time Calculation (min): 28 min  Charges: OT General Charges $OT Visit: 1 Visit OT Treatments $Self Care/Home Management : 23-37 mins  Jefferey Pica, OTR/L Acute Rehabilitation Services Pager: [phone] Office: [phone]    Basheer Molchan C 11/03/2020, 8:31  AM

## 2020-11-03 NOTE — TOC Transition Note (Signed)
Transition of Care [facility]) - CM/SW Discharge Note   Patient Details  Name: Jorge Sherman MRN: [id number] Date of Birth: [birth year]  [facility] CM/SW Contact:  Bartholomew Crews, RN Phone Number: [phone] 11/03/2020, 12:18 PM   Clinical Narrative:     Patient to transition home today. Discussed with patient at the bedside. Spoke with April at [place] to notify of transition home, and again to notify of ambulance arrival. Spoke with [name] (daughter) to discuss transition home. Offered patient choice again for home with home health and caregiver services. Patient adamant that he is not going to a nursing facility and that he wants to go to his home. Discussed option of going to [facility], but patient stated not for permanent transition. [facility] arranged for transport. No further TOC needs identified at this time.   Final next level of care: [place] Barriers to Discharge: No Barriers Identified   Patient Goals and CMS Choice Patient states their goals for this hospitalization and ongoing recovery are:: to go home CMS Medicare.gov Compare Post Acute Care list provided to:: Patient Choice offered to / list presented to : Patient  Discharge Placement                       Discharge Plan and Services In-house Referral: Clinical Social Work Discharge Planning Services: CM Consult Post Acute Care Choice: Home Health          DME Arranged: N/A DME Agency: NA       HH Arranged: RN,PT,OT [place] Agency: [place] Date [place]: 11/03/20 Time [place]: 1657 Representative spoke with at [place]: [name]  Social Determinants of Health ([place]) Interventions     Readmission Risk Interventions Readmission Risk Prevention Plan 06/29/2020  Transportation Screening Complete  Home Care Screening Complete  Medication Review (RN CM) Complete  Some recent data might be hidden

## 2020-11-03 NOTE — Care Management Important Message (Signed)
Important Message  Patient Details  Name: Jorge Sherman MRN: [id number] Date of Birth: Nov 01, 1941   Medicare Important Message Given:  Yes     Curtisha Bendix P [place] 11/03/2020, 12:45 PM

## 2020-11-03 NOTE — Progress Notes (Signed)
DISCHARGE NOTE HOME [facility] PICKUP to be discharged Home via [facility] per MD order. Discussed prescriptions and follow up appointments with the patient. Prescriptions given to patient; medication list explained in detail. Patient verbalized understanding.  Skin clean, dry and intact without evidence of skin break down, no evidence of skin tears noted. IV catheter discontinued intact. Site without signs and symptoms of complications. Dressing and pressure applied. Pt denies pain at the site currently. No complaints noted.  Patient free of lines, drains, and wounds.   An After Visit Summary (AVS) was printed and given to the patient. Patient escorted via wheelchair, and discharged home via private auto.  Arlyss Repress, RN

## 2020-11-03 NOTE — Progress Notes (Signed)
Physical Therapy Treatment Patient Details Name: Jorge Sherman MRN: [id number] DOB: [birth year] Today's Date: 11/03/2020    History of Present Illness 79 year old male with past medical history of hypertension, chronic pain syndrome, hyperlipidemia, BPH, depression, diabetes mellitus type 2 (diet controlled), coronary artery disease (cath with BMS placement in 2009), OSA (not compliant with CPAP), iron deficiency anemia, rheumatoid arthritis who presents to [facility] emergency department with complaints of severe right chest wall and flank pain after a fall. Imaging negative for fracture.    PT Comments    Patient received sitting EOB with NT, had wanted to change position due to pain. Worked on heel cord and hamstring stretching sitting EOB to reduce pain as based on his report/description of symptoms it does sound like some muscle tightness/stiffness is contributing to pain. Able to convince him to stand up to allow NT to change bed pads as they were wet/soiled- able to get to standing with modA/RW and take side steps alongside bed with MinA/RW but very limited due to pain. Challenged him multiple times during session as to how he will manage at home without 24/7 assistance and he is unable to really answer me. Definitely has deficits in safety awareness/problem solving/reduced awareness of functional deficits. Left in bed with NT present and assisting, all other needs met. Continue to feel he would benefit from SNF but he continues to refuse.   Follow Up Recommendations  SNF;Supervision/Assistance - 24 hour;Home health PT (refusing SNF so will need HHPT)     Equipment Recommendations  None recommended by PT    Recommendations for Other Services       Precautions / Restrictions Precautions Precautions: Fall Precaution Comments: severe bilateral shoulder pain, pt states "my shoulders are broken" Restrictions Weight Bearing Restrictions: No    Mobility  Bed Mobility Overal  bed mobility: Needs Assistance Bed Mobility: Sit to Supine Rolling: Min assist   Supine to sit: Min assist Sit to supine: Min assist   General bed mobility comments: MinA and use of rails to lay back down in bed, most assistance given for BLE management  Transfers Overall transfer level: Needs assistance Equipment used: Rolling walker (2 wheeled) Transfers: Sit to/from Stand Sit to Stand: Mod assist         General transfer comment: ModA to boost to upright standing, cues for hand placement and safety but still self-selects pulling up on RW  Ambulation/Gait             General Gait Details: able to take sidesteps alongside EOB with MinA/cues for sequencing/safety with RW   Stairs             Wheelchair Mobility    Modified Rankin (Stroke Patients Only)       Balance Overall balance assessment: History of Falls;Needs assistance Sitting-balance support: Feet supported Sitting balance-Leahy Scale: Fair     Standing balance support: Bilateral upper extremity supported;During functional activity Standing balance-Leahy Scale: Poor Standing balance comment: reliant on external support                            Cognition Arousal/Alertness: Awake/alert Behavior During Therapy: Impulsive;Anxious;Restless Overall Cognitive Status: Impaired/Different from baseline Area of Impairment: Safety/judgement;Problem solving;Memory                     Memory: Decreased short-term memory Following Commands: Follows one step commands consistently;Follows one step commands with increased time Safety/Judgement: Decreased awareness of safety;Decreased awareness  of deficits   Problem Solving: Requires verbal cues;Difficulty sequencing;Decreased initiation General Comments: significant loss of awareness of deficits/safety, really seems to have no sense of functional impairments or problem solving.      Exercises      General Comments General comments  (skin integrity, edema, etc.): VSS on RA      Pertinent Vitals/Pain Pain Assessment: Faces Faces Pain Scale: Hurts even more Pain Location: shoulders and L LE "sciatic pain" Pain Descriptors / Indicators: Sore;Discomfort;Grimacing Pain Intervention(s): Limited activity within patient's tolerance;Repositioned    Home Living                      Prior Function            PT Goals (current goals can now be found in the care plan section) Acute Rehab PT Goals Patient Stated Goal: go home PT Goal Formulation: With patient Time For Goal Achievement: 11/13/20 Potential to Achieve Goals: Fair Progress towards PT goals: Progressing toward goals (very slowly, self limiting)    Frequency    Min 3X/week      PT Plan Current plan remains appropriate    Co-evaluation              AM-PAC PT "6 Clicks" Mobility   Outcome Measure  Help needed turning from your back to your side while in a flat bed without using bedrails?: A Little Help needed moving from lying on your back to sitting on the side of a flat bed without using bedrails?: A Lot Help needed moving to and from a bed to a chair (including a wheelchair)?: A Lot Help needed standing up from a chair using your arms (e.g., wheelchair or bedside chair)?: A Lot Help needed to walk in hospital room?: Total Help needed climbing 3-5 steps with a railing? : Total 6 Click Score: 11    End of Session   Activity Tolerance: Patient limited by pain Patient left: in bed;with call bell/phone within reach;with bed alarm set;with nursing/sitter in room Nurse Communication: Mobility status PT Visit Diagnosis: Muscle weakness (generalized) (M62.81);Other abnormalities of gait and mobility (R26.89);Pain Pain - Right/Left:  (bilateral) Pain - part of body: Shoulder     Time: 3353-3174 PT Time Calculation (min) (ACUTE ONLY): 18 min  Charges:  $Therapeutic Activity: 8-22 mins                     Windell Norfolk, DPT, PN1    Supplemental Physical Therapist [place]    Pager [phone] Acute Rehab Office [phone]

## 2020-11-04 LAB — CULTURE, BLOOD (ROUTINE X 2)
Culture: NO GROWTH
Culture: NO GROWTH

## 2020-11-11 ENCOUNTER — Other Ambulatory Visit: Payer: Self-pay

## 2020-11-11 ENCOUNTER — Emergency Department (HOSPITAL_COMMUNITY): Payer: Medicare HMO

## 2020-11-11 ENCOUNTER — Inpatient Hospital Stay (HOSPITAL_COMMUNITY)
Admission: EM | Admit: 2020-11-11 | Discharge: 2020-11-28 | DRG: 391 | Disposition: A | Discharge status: Skilled Nursing Facility | Payer: Medicare HMO | Attending: Internal Medicine | Admitting: Internal Medicine

## 2020-11-11 ENCOUNTER — Encounter (HOSPITAL_COMMUNITY): Payer: Self-pay | Admitting: *Deleted

## 2020-11-11 DIAGNOSIS — M48061 Spinal stenosis, lumbar region without neurogenic claudication: Secondary | ICD-10-CM | POA: Diagnosis present

## 2020-11-11 DIAGNOSIS — M545 Low back pain, unspecified: Secondary | ICD-10-CM | POA: Diagnosis not present

## 2020-11-11 DIAGNOSIS — Z91041 Radiographic dye allergy status: Secondary | ICD-10-CM | POA: Diagnosis not present

## 2020-11-11 DIAGNOSIS — R112 Nausea with vomiting, unspecified: Secondary | ICD-10-CM

## 2020-11-11 DIAGNOSIS — Z20822 Contact with and (suspected) exposure to covid-19: Secondary | ICD-10-CM | POA: Diagnosis present

## 2020-11-11 DIAGNOSIS — Z6833 Body mass index (BMI) 33.0-33.9, adult: Secondary | ICD-10-CM

## 2020-11-11 DIAGNOSIS — F32A Depression, unspecified: Secondary | ICD-10-CM | POA: Diagnosis present

## 2020-11-11 DIAGNOSIS — A084 Viral intestinal infection, unspecified: Secondary | ICD-10-CM | POA: Diagnosis present

## 2020-11-11 DIAGNOSIS — E785 Hyperlipidemia, unspecified: Secondary | ICD-10-CM | POA: Diagnosis present

## 2020-11-11 DIAGNOSIS — R109 Unspecified abdominal pain: Secondary | ICD-10-CM | POA: Diagnosis present

## 2020-11-11 DIAGNOSIS — E871 Hypo-osmolality and hyponatremia: Secondary | ICD-10-CM | POA: Diagnosis present

## 2020-11-11 DIAGNOSIS — B952 Enterococcus as the cause of diseases classified elsewhere: Secondary | ICD-10-CM | POA: Diagnosis present

## 2020-11-11 DIAGNOSIS — G4733 Obstructive sleep apnea (adult) (pediatric): Secondary | ICD-10-CM | POA: Diagnosis present

## 2020-11-11 DIAGNOSIS — Z79899 Other long term (current) drug therapy: Secondary | ICD-10-CM | POA: Diagnosis not present

## 2020-11-11 DIAGNOSIS — R197 Diarrhea, unspecified: Secondary | ICD-10-CM

## 2020-11-11 DIAGNOSIS — E1165 Type 2 diabetes mellitus with hyperglycemia: Secondary | ICD-10-CM | POA: Diagnosis present

## 2020-11-11 DIAGNOSIS — M5442 Lumbago with sciatica, left side: Secondary | ICD-10-CM | POA: Diagnosis not present

## 2020-11-11 DIAGNOSIS — J189 Pneumonia, unspecified organism: Secondary | ICD-10-CM | POA: Diagnosis present

## 2020-11-11 DIAGNOSIS — I2510 Atherosclerotic heart disease of native coronary artery without angina pectoris: Secondary | ICD-10-CM | POA: Diagnosis present

## 2020-11-11 DIAGNOSIS — D75839 Thrombocytosis, unspecified: Secondary | ICD-10-CM | POA: Diagnosis present

## 2020-11-11 DIAGNOSIS — M069 Rheumatoid arthritis, unspecified: Secondary | ICD-10-CM | POA: Diagnosis present

## 2020-11-11 DIAGNOSIS — F419 Anxiety disorder, unspecified: Secondary | ICD-10-CM | POA: Diagnosis present

## 2020-11-11 DIAGNOSIS — D72829 Elevated white blood cell count, unspecified: Secondary | ICD-10-CM

## 2020-11-11 DIAGNOSIS — G894 Chronic pain syndrome: Secondary | ICD-10-CM | POA: Diagnosis present

## 2020-11-11 DIAGNOSIS — K5730 Diverticulosis of large intestine without perforation or abscess without bleeding: Secondary | ICD-10-CM | POA: Diagnosis present

## 2020-11-11 DIAGNOSIS — N390 Urinary tract infection, site not specified: Secondary | ICD-10-CM | POA: Diagnosis present

## 2020-11-11 DIAGNOSIS — M5440 Lumbago with sciatica, unspecified side: Secondary | ICD-10-CM | POA: Diagnosis not present

## 2020-11-11 DIAGNOSIS — Z87891 Personal history of nicotine dependence: Secondary | ICD-10-CM

## 2020-11-11 DIAGNOSIS — Z8249 Family history of ischemic heart disease and other diseases of the circulatory system: Secondary | ICD-10-CM

## 2020-11-11 DIAGNOSIS — K529 Noninfective gastroenteritis and colitis, unspecified: Secondary | ICD-10-CM | POA: Diagnosis not present

## 2020-11-11 DIAGNOSIS — Z888 Allergy status to other drugs, medicaments and biological substances status: Secondary | ICD-10-CM

## 2020-11-11 DIAGNOSIS — Z66 Do not resuscitate: Secondary | ICD-10-CM | POA: Diagnosis present

## 2020-11-11 DIAGNOSIS — E860 Dehydration: Secondary | ICD-10-CM | POA: Diagnosis present

## 2020-11-11 DIAGNOSIS — G8929 Other chronic pain: Secondary | ICD-10-CM | POA: Diagnosis not present

## 2020-11-11 DIAGNOSIS — E876 Hypokalemia: Secondary | ICD-10-CM | POA: Diagnosis present

## 2020-11-11 DIAGNOSIS — I10 Essential (primary) hypertension: Secondary | ICD-10-CM | POA: Diagnosis present

## 2020-11-11 DIAGNOSIS — N400 Enlarged prostate without lower urinary tract symptoms: Secondary | ICD-10-CM | POA: Diagnosis present

## 2020-11-11 DIAGNOSIS — Z9119 Patient's noncompliance with other medical treatment and regimen: Secondary | ICD-10-CM | POA: Diagnosis not present

## 2020-11-11 DIAGNOSIS — R6510 Systemic inflammatory response syndrome (SIRS) of non-infectious origin without acute organ dysfunction: Secondary | ICD-10-CM

## 2020-11-11 DIAGNOSIS — G43909 Migraine, unspecified, not intractable, without status migrainosus: Secondary | ICD-10-CM | POA: Diagnosis present

## 2020-11-11 DIAGNOSIS — Z885 Allergy status to narcotic agent status: Secondary | ICD-10-CM | POA: Diagnosis not present

## 2020-11-11 DIAGNOSIS — K5900 Constipation, unspecified: Secondary | ICD-10-CM | POA: Diagnosis not present

## 2020-11-11 DIAGNOSIS — R748 Abnormal levels of other serum enzymes: Secondary | ICD-10-CM

## 2020-11-11 DIAGNOSIS — Z833 Family history of diabetes mellitus: Secondary | ICD-10-CM

## 2020-11-11 DIAGNOSIS — Z881 Allergy status to other antibiotic agents status: Secondary | ICD-10-CM

## 2020-11-11 DIAGNOSIS — E669 Obesity, unspecified: Secondary | ICD-10-CM | POA: Diagnosis present

## 2020-11-11 DIAGNOSIS — M549 Dorsalgia, unspecified: Secondary | ICD-10-CM

## 2020-11-11 LAB — COMPREHENSIVE METABOLIC PANEL
ALT: 19 U/L (ref 0–44)
AST: 20 U/L (ref 15–41)
Albumin: 4 g/dL (ref 3.5–5.0)
Alkaline Phosphatase: 56 U/L (ref 38–126)
Anion gap: 10 (ref 5–15)
BUN: 13 mg/dL (ref 8–23)
CO2: 28 mmol/L (ref 22–32)
Calcium: 9.4 mg/dL (ref 8.9–10.3)
Chloride: 88 mmol/L — ABNORMAL LOW (ref 98–111)
Creatinine, Ser: 0.74 mg/dL (ref 0.61–1.24)
GFR, Estimated: >60 mL/min (ref >60)
Glucose, Bld: 114 mg/dL — ABNORMAL HIGH (ref 70–99)
Potassium: 3.4 mmol/L — ABNORMAL LOW (ref 3.5–5.1)
Sodium: 126 mmol/L — ABNORMAL LOW (ref 135–145)
Total Bilirubin: 0.5 mg/dL (ref 0.3–1.2)
Total Protein: 6.9 g/dL (ref 6.5–8.1)

## 2020-11-11 LAB — URINALYSIS, ROUTINE W REFLEX MICROSCOPIC
Bilirubin Urine: NEGATIVE
Glucose, UA: NEGATIVE mg/dL
Hgb urine dipstick: NEGATIVE
Ketones, ur: NEGATIVE mg/dL
Nitrite: NEGATIVE
Protein, ur: NEGATIVE mg/dL
Specific Gravity, Urine: 1.004 — ABNORMAL LOW (ref 1.005–1.030)
pH: 7 (ref 5.0–8.0)

## 2020-11-11 LAB — CBC WITH DIFFERENTIAL/PLATELET
Abs Immature Granulocytes: 0.45 10*3/uL — ABNORMAL HIGH (ref 0.00–0.07)
Basophils Absolute: 0.1 10*3/uL (ref 0.0–0.1)
Basophils Relative: 1 %
Eosinophils Absolute: 0.1 10*3/uL (ref 0.0–0.5)
Eosinophils Relative: 1 %
HCT: 35.2 % — ABNORMAL LOW (ref 39.0–52.0)
Hemoglobin: 11.8 g/dL — ABNORMAL LOW (ref 13.0–17.0)
Immature Granulocytes: 3 %
Lymphocytes Relative: 8 %
Lymphs Abs: 1.5 10*3/uL (ref 0.7–4.0)
MCH: 30.1 pg (ref 26.0–34.0)
MCHC: 33.5 g/dL (ref 30.0–36.0)
MCV: 89.8 fL (ref 80.0–100.0)
Monocytes Absolute: 1.5 10*3/uL — ABNORMAL HIGH (ref 0.1–1.0)
Monocytes Relative: 8 %
Neutro Abs: 14.7 10*3/uL — ABNORMAL HIGH (ref 1.7–7.7)
Neutrophils Relative %: 79 %
Platelets: 458 10*3/uL — ABNORMAL HIGH (ref 150–400)
RBC: 3.92 MIL/uL — ABNORMAL LOW (ref 4.22–5.81)
RDW: 13.4 % (ref 11.5–15.5)
WBC: 18.3 10*3/uL — ABNORMAL HIGH (ref 4.0–10.5)
nRBC: 0 % (ref 0.0–0.2)

## 2020-11-11 LAB — PROTIME-INR
INR: 1 (ref 0.8–1.2)
Prothrombin Time: 13.1 seconds (ref 11.4–15.2)

## 2020-11-11 LAB — RESP PANEL BY RT-PCR (FLU A&B, COVID) ARPGX2
Influenza A by PCR: NEGATIVE
Influenza B by PCR: NEGATIVE
SARS Coronavirus 2 by RT PCR: NEGATIVE

## 2020-11-11 LAB — STREP PNEUMONIAE URINARY ANTIGEN: Strep Pneumo Urinary Antigen: NEGATIVE

## 2020-11-11 LAB — LACTIC ACID, PLASMA: Lactic Acid, Venous: 0.8 mmol/L (ref 0.5–1.9)

## 2020-11-11 LAB — LIPASE, BLOOD: Lipase: 52 U/L — ABNORMAL HIGH (ref 11–51)

## 2020-11-11 LAB — GLUCOSE, CAPILLARY: Glucose-Capillary: 163 mg/dL — ABNORMAL HIGH (ref 70–99)

## 2020-11-11 LAB — APTT: aPTT: 29 seconds (ref 24–36)

## 2020-11-11 MED ORDER — INSULIN ASPART 100 UNIT/ML ~~LOC~~ SOLN
0.0000 [IU] | Freq: Three times a day (TID) | SUBCUTANEOUS | Status: DC
  Administered 2020-11-12: 17:00:00 3 [IU] via SUBCUTANEOUS
  Administered 2020-11-13: 09:00:00 2 [IU] via SUBCUTANEOUS
  Administered 2020-11-13 (×2): 5 [IU] via SUBCUTANEOUS
  Administered 2020-11-14: 12:00:00 8 [IU] via SUBCUTANEOUS
  Administered 2020-11-14: 09:00:00 3 [IU] via SUBCUTANEOUS
  Administered 2020-11-14: 18:00:00 5 [IU] via SUBCUTANEOUS
  Administered 2020-11-15: 08:00:00 3 [IU] via SUBCUTANEOUS
  Administered 2020-11-15: 18:00:00 5 [IU] via SUBCUTANEOUS
  Administered 2020-11-15 – 2020-11-16 (×2): 3 [IU] via SUBCUTANEOUS
  Administered 2020-11-16: 13:00:00 2 [IU] via SUBCUTANEOUS
  Administered 2020-11-16 – 2020-11-18 (×7): 3 [IU] via SUBCUTANEOUS
  Administered 2020-11-19 (×3): 2 [IU] via SUBCUTANEOUS
  Administered 2020-11-20: 5 [IU] via SUBCUTANEOUS
  Administered 2020-11-20: 09:00:00 3 [IU] via SUBCUTANEOUS
  Administered 2020-11-20 – 2020-11-21 (×2): 5 [IU] via SUBCUTANEOUS
  Administered 2020-11-21 (×2): 3 [IU] via SUBCUTANEOUS
  Administered 2020-11-22: 08:00:00 2 [IU] via SUBCUTANEOUS
  Administered 2020-11-22 – 2020-11-23 (×2): 3 [IU] via SUBCUTANEOUS
  Administered 2020-11-23 (×2): 5 [IU] via SUBCUTANEOUS
  Administered 2020-11-24 (×2): 2 [IU] via SUBCUTANEOUS
  Administered 2020-11-24: 12:00:00 5 [IU] via SUBCUTANEOUS
  Administered 2020-11-25: 3 [IU] via SUBCUTANEOUS
  Administered 2020-11-25: 5 [IU] via SUBCUTANEOUS
  Administered 2020-11-25: 12:00:00 11 [IU] via SUBCUTANEOUS
  Administered 2020-11-26: 5 [IU] via SUBCUTANEOUS
  Administered 2020-11-26: 3 [IU] via SUBCUTANEOUS
  Administered 2020-11-26: 8 [IU] via SUBCUTANEOUS
  Administered 2020-11-27 (×2): 3 [IU] via SUBCUTANEOUS
  Administered 2020-11-27: 17:00:00 5 [IU] via SUBCUTANEOUS
  Administered 2020-11-28: 2 [IU] via SUBCUTANEOUS
  Administered 2020-11-28: 3 [IU] via SUBCUTANEOUS

## 2020-11-11 MED ORDER — ONDANSETRON HCL 4 MG PO TABS: 4.0000 mg | ORAL_TABLET | Freq: Four times a day (QID) | ORAL | Status: DC | PRN

## 2020-11-11 MED ORDER — ENOXAPARIN SODIUM 40 MG/0.4ML ~~LOC~~ SOLN: 40.0000 mg | SUBCUTANEOUS | Status: DC

## 2020-11-11 MED ORDER — METRONIDAZOLE 500 MG PO TABS: 500.0000 mg | ORAL_TABLET | Freq: Three times a day (TID) | ORAL | Status: DC

## 2020-11-11 MED ORDER — CELECOXIB 200 MG PO CAPS
200.0000 mg | ORAL_CAPSULE | Freq: Two times a day (BID) | ORAL | Status: DC
  Administered 2020-11-11 – 2020-11-12 (×2): 200 mg via ORAL
  Filled 2020-11-11 (×2): qty 1

## 2020-11-11 MED ORDER — PANTOPRAZOLE SODIUM 40 MG PO TBEC
40.0000 mg | DELAYED_RELEASE_TABLET | Freq: Every day | ORAL | Status: DC
  Administered 2020-11-12 – 2020-11-28 (×17): 40 mg via ORAL
  Filled 2020-11-11 (×17): qty 1

## 2020-11-11 MED ORDER — AMLODIPINE BESYLATE 5 MG PO TABS: 2.5000 mg | ORAL_TABLET | Freq: Every day | ORAL | Status: DC

## 2020-11-11 MED ORDER — SODIUM CHLORIDE 0.9 % IV SOLN: 2.0000 g | INTRAVENOUS | Status: DC

## 2020-11-11 MED ORDER — DULOXETINE HCL 30 MG PO CPEP
30.0000 mg | ORAL_CAPSULE | Freq: Three times a day (TID) | ORAL | Status: DC
  Administered 2020-11-11 – 2020-11-28 (×50): 30 mg via ORAL
  Filled 2020-11-11 (×50): qty 1

## 2020-11-11 MED ORDER — GABAPENTIN 400 MG PO CAPS
800.0000 mg | ORAL_CAPSULE | Freq: Three times a day (TID) | ORAL | Status: DC
  Administered 2020-11-11 – 2020-11-12 (×2): 800 mg via ORAL
  Filled 2020-11-11 (×2): qty 2

## 2020-11-11 MED ORDER — ACETAMINOPHEN 325 MG PO TABS
650.0000 mg | ORAL_TABLET | Freq: Four times a day (QID) | ORAL | Status: DC | PRN
  Administered 2020-11-11 – 2020-11-12 (×2): 650 mg via ORAL
  Filled 2020-11-11 (×2): qty 2

## 2020-11-11 MED ORDER — CIPROFLOXACIN IN D5W 400 MG/200ML IV SOLN: 400.0000 mg | Freq: Once | INTRAVENOUS | Status: DC

## 2020-11-11 MED ORDER — ROSUVASTATIN CALCIUM 20 MG PO TABS
40.0000 mg | ORAL_TABLET | Freq: Every evening | ORAL | Status: DC
  Administered 2020-11-11 – 2020-11-27 (×17): 40 mg via ORAL
  Filled 2020-11-11 (×16): qty 2

## 2020-11-11 MED ORDER — ALBUTEROL SULFATE (2.5 MG/3ML) 0.083% IN NEBU
2.5000 mg | INHALATION_SOLUTION | RESPIRATORY_TRACT | Status: DC | PRN
  Administered 2020-11-15 – 2020-11-27 (×14): 2.5 mg via RESPIRATORY_TRACT
  Filled 2020-11-11 (×14): qty 3

## 2020-11-11 MED ORDER — HYDROXYCHLOROQUINE SULFATE 200 MG PO TABS
200.0000 mg | ORAL_TABLET | Freq: Two times a day (BID) | ORAL | Status: DC
  Administered 2020-11-12 – 2020-11-28 (×33): 200 mg via ORAL
  Filled 2020-11-11 (×33): qty 1

## 2020-11-11 MED ORDER — SODIUM CHLORIDE 0.9 % IV SOLN
500.0000 mg | INTRAVENOUS | Status: DC
  Administered 2020-11-11 – 2020-11-12 (×2): 500 mg via INTRAVENOUS
  Filled 2020-11-11 (×3): qty 500

## 2020-11-11 MED ORDER — ONDANSETRON HCL 4 MG/2ML IJ SOLN
4.0000 mg | Freq: Four times a day (QID) | INTRAMUSCULAR | Status: DC | PRN
  Administered 2020-11-12: 10:00:00 4 mg via INTRAVENOUS
  Filled 2020-11-11: qty 2

## 2020-11-11 MED ORDER — METHOCARBAMOL 500 MG PO TABS
1000.0000 mg | ORAL_TABLET | Freq: Three times a day (TID) | ORAL | Status: DC
  Administered 2020-11-11 – 2020-11-12 (×2): 1000 mg via ORAL
  Filled 2020-11-11 (×2): qty 2

## 2020-11-11 MED ORDER — LORAZEPAM 1 MG PO TABS
1.0000 mg | ORAL_TABLET | Freq: Three times a day (TID) | ORAL | Status: DC | PRN
  Administered 2020-11-11 – 2020-11-12 (×2): 2 mg via ORAL
  Filled 2020-11-11 (×2): qty 2

## 2020-11-11 MED ORDER — POTASSIUM CHLORIDE IN NACL 40-0.9 MEQ/L-% IV SOLN
INTRAVENOUS | Status: DC
  Filled 2020-11-11 (×2): qty 1000

## 2020-11-11 MED ORDER — MORPHINE SULFATE (PF) 2 MG/ML IV SOLN
2.0000 mg | INTRAVENOUS | Status: DC | PRN
Start: 2020-11-11 — End: 2020-11-12
  Administered 2020-11-11 – 2020-11-12 (×4): 2 mg via INTRAVENOUS
  Filled 2020-11-11 (×4): qty 1

## 2020-11-11 MED ORDER — LIDOCAINE 5 % EX PTCH
1.0000 | MEDICATED_PATCH | Freq: Every day | CUTANEOUS | Status: DC | PRN
  Administered 2020-11-12 – 2020-11-20 (×2): 1 via TRANSDERMAL
  Filled 2020-11-11 (×3): qty 1

## 2020-11-11 MED ORDER — ACETAMINOPHEN 650 MG RE SUPP: 650.0000 mg | Freq: Four times a day (QID) | RECTAL | Status: DC | PRN

## 2020-11-11 MED ORDER — FUROSEMIDE 40 MG PO TABS: 20.0000 mg | ORAL_TABLET | Freq: Every day | ORAL | Status: DC | PRN

## 2020-11-11 MED ORDER — TAMSULOSIN HCL 0.4 MG PO CAPS
0.4000 mg | ORAL_CAPSULE | Freq: Every day | ORAL | Status: DC
  Administered 2020-11-11 – 2020-11-27 (×17): 0.4 mg via ORAL
  Filled 2020-11-11 (×17): qty 1

## 2020-11-11 MED ORDER — ONDANSETRON HCL 4 MG/2ML IJ SOLN: 4.0000 mg | Freq: Once | INTRAMUSCULAR | Status: DC

## 2020-11-11 MED ORDER — ONDANSETRON HCL 4 MG/2ML IJ SOLN
INTRAMUSCULAR | Status: AC
  Administered 2020-11-11: 13:00:00 4 mg via INTRAVENOUS
  Filled 2020-11-11: qty 2

## 2020-11-11 MED ORDER — SODIUM CHLORIDE 0.9 % IV SOLN: 500.0000 mg | INTRAVENOUS | Status: DC

## 2020-11-11 MED ORDER — MORPHINE SULFATE (PF) 4 MG/ML IV SOLN
4.0000 mg | Freq: Once | INTRAVENOUS | Status: AC
  Administered 2020-11-11: 13:00:00 4 mg via INTRAVENOUS
  Filled 2020-11-11: qty 1

## 2020-11-11 MED ORDER — SODIUM CHLORIDE 0.9 % IV SOLN
2.0000 g | INTRAVENOUS | Status: DC
  Administered 2020-11-11 – 2020-11-13 (×3): 2 g via INTRAVENOUS
  Filled 2020-11-11: qty 1.88
  Filled 2020-11-11: qty 2
  Filled 2020-11-11: qty 1.88

## 2020-11-11 MED ORDER — SODIUM CHLORIDE 0.9 % IV BOLUS
1000.0000 mL | Freq: Once | INTRAVENOUS | Status: AC
  Administered 2020-11-11: 13:00:00 1000 mL via INTRAVENOUS

## 2020-11-11 MED ORDER — CLONIDINE HCL 0.1 MG PO TABS
0.1000 mg | ORAL_TABLET | Freq: Two times a day (BID) | ORAL | Status: DC
  Administered 2020-11-11 – 2020-11-28 (×34): 0.1 mg via ORAL
  Filled 2020-11-11 (×34): qty 1

## 2020-11-11 MED ORDER — HYDROMORPHONE HCL 1 MG/ML IJ SOLN
0.5000 mg | Freq: Once | INTRAMUSCULAR | Status: AC
  Administered 2020-11-11: 14:00:00 0.5 mg via INTRAVENOUS
  Filled 2020-11-11: qty 1

## 2020-11-11 MED ORDER — KETOROLAC TROMETHAMINE 15 MG/ML IJ SOLN
15.0000 mg | Freq: Four times a day (QID) | INTRAMUSCULAR | Status: DC | PRN
  Administered 2020-11-11 – 2020-11-12 (×3): 15 mg via INTRAVENOUS
  Filled 2020-11-11 (×3): qty 1

## 2020-11-11 MED ORDER — INSULIN ASPART 100 UNIT/ML ~~LOC~~ SOLN
0.0000 [IU] | Freq: Every day | SUBCUTANEOUS | Status: DC
  Administered 2020-11-15 – 2020-11-26 (×2): 2 [IU] via SUBCUTANEOUS

## 2020-11-11 MED ORDER — METOPROLOL TARTRATE 5 MG/5ML IV SOLN
5.0000 mg | Freq: Four times a day (QID) | INTRAVENOUS | Status: DC | PRN
  Administered 2020-11-12 – 2020-11-15 (×4): 5 mg via INTRAVENOUS
  Filled 2020-11-11 (×4): qty 5

## 2020-11-11 MED ORDER — DICLOFENAC SODIUM 1 % EX GEL
2.0000 g | Freq: Four times a day (QID) | CUTANEOUS | Status: DC | PRN
  Administered 2020-11-16 – 2020-11-25 (×3): 2 g via TOPICAL
  Filled 2020-11-11: qty 100

## 2020-11-11 MED ORDER — OXYCODONE-ACETAMINOPHEN 5-325 MG PO TABS
1.0000 | ORAL_TABLET | Freq: Four times a day (QID) | ORAL | Status: DC | PRN
Start: 2020-11-11 — End: 2020-11-12
  Administered 2020-11-11 – 2020-11-12 (×2): 1 via ORAL
  Filled 2020-11-11 (×2): qty 1

## 2020-11-11 NOTE — ED Notes (Signed)
Pt urinated on himself while in CT. CT tech stated that she was unable to catch a sample. Pt aware that he needs to give a urine sample and will call when when he has to void again

## 2020-11-11 NOTE — ED Notes (Signed)
Report called to Kermit Marylin Crosby

## 2020-11-11 NOTE — ED Provider Notes (Signed)
[facility] DEPT Provider Note   CSN: [id number] Arrival date & time: 11/11/20  1238     History Fever, diarrhea   Jorge Sherman is a 79 y.o. male with medical history significant for diabetes, hypertension, chronic pain who presents for evaluation of fever. Patient states yesterday he has developed multiple episodes of NBNB emesis. Has not been able to keep anything down. Has had some persistent nausea. States he also developed diarrhea at the same time. He denies any melena or bright blood per rectum. States he has had generalized abdominal cramping. He is not able to specify location. Has been having chills worse not taken his temperature. Was noted to be febrile with EMS up to 101. Was given 1000 g Tylenol and 8 mg Zofran. Dates he has been having some myalgias. He has not been vaccinated against Covid. States he has been exposed multiple times over the last few months however was unable to specify how recently. He denies any headache, lightheadedness, dizziness, neck pain, neck stiffness, congestion, rhinorrhea, cough, shortness of breath, chest pain, hemoptysis, dysuria, hematuria, rashes or lesions. Has not take anything for symptoms. Denies general aggravating or alleviating factors. No recent antibiotics or travel.  Recently admitted and discharged on 11/03/2020 with dehydration with hyponatremia. PT/OT seen during admission recommended SNF at outpatient however patient declined however was amenable to home health.  History obtained from patient, past medical records as well as EMS. No interpreter is used.  HPI     Past Medical History:  Diagnosis Date  . Chronic pain   . Depressive disorder, not elsewhere classified   . Dizziness and giddiness   . Essential and other specified forms of tremor   . Headache(784.0)   . Leukocytosis, unspecified   . Obstructive sleep apnea (adult) (pediatric)   . Pain in joint, lower leg   . Septic joint of left  knee joint ([place]) 04/22/2019  . Spinal stenosis, lumbar region, without neurogenic claudication   . Thoracic or lumbosacral neuritis or radiculitis, unspecified   . Type II or unspecified type diabetes mellitus without mention of complication, not stated as uncontrolled   . Unspecified cardiovascular disease   . Unspecified essential hypertension     Patient Active Problem List   Diagnosis Date Noted  . Abdominal pain 11/11/2020  . Gastroenteritis 11/11/2020  . Hyponatremia 11/11/2020  . Falls 10/30/2020  . Dehydration with hyponatremia 10/29/2020  . Fall at home, initial encounter 10/29/2020  . Musculoskeletal chest pain 10/29/2020  . Mixed hyperlipidemia due to type 2 diabetes mellitus ([place]) 10/29/2020  . Benign prostatic hyperplasia without lower urinary tract symptoms 10/29/2020  . Ileus ([place])   . Declining functional status   . Goals of care, counseling/discussion   . Palliative care by specialist   . AKI (acute kidney injury) ([place]) 06/27/2020  . Acute diarrhea 06/27/2020  . Altered mental status 08/28/2019  . Septic joint of left knee joint ([place]) 04/22/2019  . Erectile dysfunction 10/30/2013  . Essential hypertension, benign 10/30/2013  . Coronary artery disease involving native coronary artery of native heart without angina pectoris 10/30/2013  . Dizziness and giddiness   . Pain in joint, lower leg   . Leukocytosis   . Type 2 diabetes mellitus without complication, with long-term current use of insulin ([place])   . Obstructive sleep apnea   . Depressive disorder, not elsewhere classified   . Essential and other specified forms of tremor   . Headache(784.0)   . Spinal stenosis, lumbar region,  without neurogenic claudication   . Thoracic or lumbosacral neuritis or radiculitis, unspecified   . Chronic pain syndrome     Past Surgical History:  Procedure Laterality Date  . BACK SURGERY    . back surgey     times 3       Family History  Problem Relation Age of Onset   . Cancer Father   . Heart disease Father   . Stroke Mother   . Diabetes Mother     Social History   Tobacco Use  . Smoking status: Former Research scientist (life sciences)  . Smokeless tobacco: Never Used  Substance Use Topics  . Alcohol use: No  . Drug use: No    Home Medications Prior to Admission medications   Medication Sig Start Date End Date Taking? Authorizing Provider  acetaminophen (TYLENOL) 325 MG tablet Take 2 tablets (650 mg total) by mouth every 6 (six) hours as needed for mild pain (or Fever >/= 101). 07/04/20   Georgette Shell, MD  albuterol (VENTOLIN HFA) 108 (90 Base) MCG/ACT inhaler Inhale 2 puffs into the lungs every 4 (four) hours as needed for wheezing or shortness of breath. 11/03/20   Pokhrel, Corrie Mckusick, MD  amLODipine (NORVASC) 2.5 MG tablet Take 2.5 mg by mouth daily. Patient not taking: Reported on 10/30/2020    [provider]  celecoxib (CELEBREX) 200 MG capsule Take 200 mg by mouth 2 (two) times daily.    [provider]  cloNIDine (CATAPRES) 0.1 MG tablet Take 0.1 mg by mouth 2 (two) times daily.     [provider]  diclofenac Sodium (VOLTAREN) 1 % GEL Apply 2 g topically 4 (four) times daily as needed for pain. 01/02/20   [provider]  DULoxetine (CYMBALTA) 30 MG capsule Take 30 mg by mouth 3 (three) times daily.     [provider]  ferrous sulfate 325 (65 FE) MG tablet Take 325 mg by mouth daily.     [provider]  furosemide (LASIX) 20 MG tablet Take 20 mg by mouth daily as needed. Patient not taking: Reported on 10/30/2020 01/20/20   [provider]  gabapentin (NEURONTIN) 800 MG tablet Take 800 mg by mouth 3 (three) times daily. 06/10/20   [provider]  hydroxychloroquine (PLAQUENIL) 200 MG tablet Take 200 mg by mouth 2 (two) times daily.    [provider]  ipratropium-albuterol (DUONEB) 0.5-2.5 (3) MG/3ML SOLN SMARTSIG:1 Vial(s) Via Nebulizer PRN Patient not taking: Reported on  10/30/2020 02/13/20   [provider]  Iron-FA-B Cmp-C-Biot-Probiotic (FUSION PLUS PO) Take 1 capsule by mouth daily.    [provider]  lidocaine (LIDODERM) 5 % Place 1 patch onto the skin daily. Remove & Discard patch within 12 hours or as directed by MD. Right chest 11/03/20   Pokhrel, Corrie Mckusick, MD  Lidocaine-Menthol 3.6-1.25 % PTCH Place 1 patch onto the skin daily as needed (shoulder pain). 01/02/20   [provider]  linaclotide (LINZESS) 72 MCG capsule Take 72 mcg by mouth daily as needed (constipation).    [provider]  loperamide (IMODIUM A-D) 2 MG tablet Take 1 tablet (2 mg total) by mouth 4 (four) times daily as needed for diarrhea or loose stools. 07/04/20   Georgette Shell, MD  LORazepam (ATIVAN) 1 MG tablet Take 2 tablets (2 mg total) by mouth every 8 (eight) hours as needed for anxiety. Patient taking differently: Take 1-2 mg by mouth every 8 (eight) hours as needed for anxiety. 07/06/20   Rodena Piety,  Noland Fordyce, MD  methocarbamol (ROBAXIN) 500 MG tablet Take 1,000 mg by mouth 3 (three) times daily. 06/15/20   [provider]  NARCAN 4 MG/0.1ML LIQD nasal spray kit Place 1 spray into the nose once. 01/27/20   [provider]  omeprazole (PRILOSEC) 40 MG capsule Take 40 mg by mouth daily.    [provider]  ondansetron (ZOFRAN) 4 MG tablet Take 1 tablet (4 mg total) by mouth every 6 (six) hours as needed for nausea. 07/04/20   Georgette Shell, MD  oxyCODONE-acetaminophen (PERCOCET/ROXICET) 5-325 MG tablet Take 1 tablet by mouth every 6 (six) hours as needed for up to 20 doses for moderate pain or severe pain. 11/03/20   Pokhrel, Corrie Mckusick, MD  pantoprazole (PROTONIX) 40 MG tablet Take 40 mg by mouth daily. 11/03/20   [provider]  polyethylene glycol (MIRALAX / GLYCOLAX) 17 g packet Take 17 g by mouth daily as needed for mild constipation. 11/03/20   Pokhrel, Corrie Mckusick, MD  rosuvastatin (CRESTOR) 40 MG tablet Take 40  mg by mouth every evening.     [provider]  saccharomyces boulardii (FLORASTOR) 250 MG capsule Take 1 capsule (250 mg total) by mouth 2 (two) times daily. Patient not taking: No sig reported 07/04/20   Georgette Shell, MD  tamsulosin (FLOMAX) 0.4 MG CAPS capsule Take 0.4 mg by mouth daily.    [provider]    Allergies    Cefepime, Iohexol, Fentanyl, and Iodine  Review of Systems   Review of Systems  Constitutional: Positive for chills, fatigue and fever.  HENT: Negative.   Respiratory: Negative.   Cardiovascular: Negative.   Gastrointestinal: Positive for abdominal pain, diarrhea, nausea and vomiting. Negative for abdominal distention, anal bleeding, blood in stool, constipation and rectal pain.  Genitourinary: Negative.   Musculoskeletal: Positive for myalgias. Negative for arthralgias, back pain, gait problem, joint swelling, neck pain and neck stiffness.  Skin: Negative.   Neurological: Positive for weakness (Generalized).  All other systems reviewed and are negative.   Physical Exam Updated Vital Signs BP (!) 154/79 (BP Location: Left Arm)   Pulse 89   Temp 97.9 F (36.6 C) (Oral)   Resp 16   Ht 5' 11"  (1.803 m)   Wt 106.6 kg   SpO2 98%   BMI 32.78 kg/m   Physical Exam Vitals and nursing note reviewed.  Constitutional:      General: He is not in acute distress.    Appearance: He is well-developed and well-nourished. He is obese. He is ill-appearing (Chronically ill appaering). He is not diaphoretic.  HENT:     Head: Normocephalic and atraumatic.     Nose: Nose normal.     Mouth/Throat:     Mouth: Mucous membranes are dry.  Eyes:     Pupils: Pupils are equal, round, and reactive to light.  Neck:     Trachea: Trachea and phonation normal.     Comments: Full range of motion without difficulty Cardiovascular:     Rate and Rhythm: Normal rate and regular rhythm.     Pulses: Normal pulses.          Radial pulses are 2+ on the right side  and 2+ on the left side.       Dorsalis pedis pulses are 2+ on the right side and 2+ on the left side.     Heart sounds: Normal heart sounds.  Pulmonary:     Effort: Pulmonary effort is normal. No respiratory distress.  Breath sounds: Normal breath sounds and air entry.     Comments: Speaks in full sentences without difficulty Chest:     Comments: Equal rise and fall to chest wall. No crepitus. Abdominal:     General: Bowel sounds are normal. There is no distension.     Palpations: Abdomen is soft.     Tenderness: There is generalized abdominal tenderness. There is no guarding or rebound. Negative signs include Murphy's sign and McBurney's sign.     Hernia: No hernia is present.     Comments: Soft, generalized tenderness. No rebound or guarding. Negative Murphy sign, McBurney point. Does have some old ecchymosis to right lower abdomen.  Musculoskeletal:        General: Normal range of motion.     Cervical back: Full passive range of motion without pain, normal range of motion and neck supple.     Comments: Moves all 4 extremities at difficulty. Compartments soft. No bony tenderness.  Skin:    General: Skin is warm and dry.     Capillary Refill: Capillary refill takes 2 to 3 seconds.     Comments: Scattered areas of very stages of ecchymosis. He has old, healing nonsuturable lacerations to anterior right shin. No surrounding erythema or warmth  Neurological:     General: No focal deficit present.     Mental Status: He is alert.     Cranial Nerves: Cranial nerves are intact.     Sensory: Sensation is intact.     Motor: Motor function is intact.     Comments: Cranial nerves II through XII grossly intact Alert to person, place and time Intact sensation Equal handgrip bilaterally  Psychiatric:        Mood and Affect: Mood and affect normal.     ED Results / Procedures / Treatments   Labs (all labs ordered are listed, but only abnormal results are displayed) Labs Reviewed   LIPASE, BLOOD - Abnormal; Notable for the following components:      Result Value   Lipase 52 (*)    All other components within normal limits  COMPREHENSIVE METABOLIC PANEL - Abnormal; Notable for the following components:   Sodium 126 (*)    Potassium 3.4 (*)    Chloride 88 (*)    Glucose, Bld 114 (*)    All other components within normal limits  URINALYSIS, ROUTINE W REFLEX MICROSCOPIC - Abnormal; Notable for the following components:   Color, Urine STRAW (*)    Specific Gravity, Urine 1.004 (*)    Leukocytes,Ua LARGE (*)    Bacteria, UA RARE (*)    All other components within normal limits  CBC WITH DIFFERENTIAL/PLATELET - Abnormal; Notable for the following components:   WBC 18.3 (*)    RBC 3.92 (*)    Hemoglobin 11.8 (*)    HCT 35.2 (*)    Platelets 458 (*)    Neutro Abs 14.7 (*)    Monocytes Absolute 1.5 (*)    Abs Immature Granulocytes 0.45 (*)    All other components within normal limits  RESP PANEL BY RT-PCR (FLU A&B, COVID) ARPGX2  CULTURE, BLOOD (SINGLE)  URINE CULTURE  GASTROINTESTINAL PANEL BY PCR, STOOL (REPLACES STOOL CULTURE)  C DIFFICILE (CDIFF) QUICK SCRN (NO PCR REFLEX)  LACTIC ACID, PLASMA  PROTIME-INR  APTT  LEGIONELLA PNEUMOPHILA SEROGP 1 UR AG  STREP PNEUMONIAE URINARY ANTIGEN  CBC  COMPREHENSIVE METABOLIC PANEL  HEMOGLOBIN A1C    EKG None  Radiology CT ABDOMEN PELVIS WO CONTRAST  Result Date: 11/11/2020 CLINICAL DATA:  79 year old male with acute abdominal pain. EXAM: CT ABDOMEN AND PELVIS WITHOUT CONTRAST TECHNIQUE: Multidetector CT imaging of the abdomen and pelvis was performed following the standard protocol without IV contrast. COMPARISON:  CT abdomen pelvis dated 06/29/2020. FINDINGS: Evaluation of this exam is limited in the absence of intravenous contrast. Evaluation is also limited due to severe streak artifact caused by spinal hardware. Lower chest: The visualized lung bases are clear. There is 3 vessel coronary vascular  calcification. No intra-abdominal free air or free fluid Hepatobiliary: The liver is unremarkable as visualized. Small stones may be present within the gallbladder. No pericholecystic fluid or evidence of acute cholecystitis by CT. Pancreas: Unremarkable. No pancreatic ductal dilatation or surrounding inflammatory changes. Spleen: Normal in size without focal abnormality. Adrenals/Urinary Tract: The adrenal glands unremarkable. Indeterminate 2 cm exophytic hypodense lesion from the medial upper pole of the right kidney. There is a 5 mm nonobstructing stone or parenchymal calcification in the interpolar left kidney. There is no hydronephrosis or obstructing stone on either side. The visualized ureters and urinary bladder appear unremarkable. Stomach/Bowel: AP several small scattered sigmoid diverticula without active inflammatory changes. There is loose stool throughout the colon compatible with diarrheal state. Correlation with clinical exam and stool cultures recommended. There is no bowel obstruction or active inflammation. No evidence of acute appendicitis. Vascular/Lymphatic: Advanced aortoiliac atherosclerotic disease. The IVC is unremarkable. No portal venous gas. There is no adenopathy. Reproductive: The prostate and seminal vesicles are grossly unremarkable. No pelvic mass. Other: None Musculoskeletal: Osteopenia with degenerative changes of the spine. L1 vertebroplasty. L2-S1 posterior fusion hardware. No acute osseous pathology. IMPRESSION: 1. Diarrheal state. Correlation with clinical exam and stool cultures recommended. No bowel obstruction. 2. Sigmoid diverticulosis. 3. A 5 mm nonobstructing stone or a focus of parenchymal calcification in the interpolar left kidney. No hydronephrosis or obstructing stone. 4. Aortic Atherosclerosis (ICD10-I70.0). Electronically Signed   By: Anner Crete M.D.   On: 11/11/2020 15:22   DG Chest Port 1 View  Result Date: 11/11/2020 CLINICAL DATA:  Fever EXAM:  PORTABLE CHEST 1 VIEW COMPARISON:  October 29, 2020 chest radiograph and chest CT FINDINGS: There is ill-defined opacity in the left base. Lungs otherwise are clear. Heart size and pulmonary vascularity are normal. No adenopathy. There is arthropathy in the shoulders. IMPRESSION: Ill-defined opacity left base consistent with developing pneumonia. Advise check of COVID-19 status given this appearance. Lungs elsewhere clear. Heart size normal. Electronically Signed   By: Lowella Grip III M.D.   On: 11/11/2020 14:39    Procedures .Critical Care Performed by: Nettie Elm, PA-C Authorized by: Nettie Elm, PA-C   Critical care provider statement:    Critical care time (minutes):  35   Critical care was necessary to treat or prevent imminent or life-threatening deterioration of the following conditions:  Sepsis   Critical care was time spent personally by me on the following activities:  Discussions with consultants, evaluation of patient's response to treatment, examination of patient, ordering and performing treatments and interventions, ordering and review of laboratory studies, ordering and review of radiographic studies, pulse oximetry, re-evaluation of patient's condition, obtaining history from patient or surrogate and review of old charts   (including critical care time)  Medications Ordered in ED Medications  sodium chloride 0.9 % bolus 1,000 mL (1,000 mLs Intravenous New Bag/Given 11/11/20 1312)  morphine 4 MG/ML injection 4 mg (4 mg Intravenous Given 11/11/20 1316)  HYDROmorphone (DILAUDID) injection 0.5 mg (0.5 mg Intravenous  Given 11/11/20 1413)    ED Course  I have reviewed the triage vital signs and the nursing notes.  Pertinent labs & imaging results that were available during my care of the patient were reviewed by me and considered in my medical decision making (see chart for details).  79 year old, appears chronically ill her nonseptic presents for evaluation  of fever, nausea vomiting as well as diarrhea which began yesterday. He has not vaccinations COVID. His heart and lungs were clear. His abdomen is diffusely tender. No focal tenderness. He denies any melena or bright red blood per rectum in his diarrhea. No recent antibiotics or travel. Denies any dysuria, hematuria. Does have history of multiple falls was recently discharged from the hospital little over 1 week ago for multiple falls, leukocytosis and hyponatremia. Had recommended that time to DC to SNF however patient declined. Patient has had little intake at home. Plan on labs, imaging and reassess  Labs and imaging personally reviewed and interpreted:  CBC leukocytosis at 18.3 with shift, 11.8 at baseline CMP hyponatremia at 126, potassium 3.4, glucose 114, chloride 88, no additional electrolyte, renal abnormality Lipase 52 Lactic acid 0.8,low suspicion for ischemic bowel INR 1.0 COVID negative UA pending. Patient unable to give UA, Urinated on self on CT scanner. Refuses cath UA DG chest possible opacity at LL. EKG CT AP WO contrast 2/2 allergy with diarrheal illness. COVID negative  Patient reassessed.  He has had pain despite 2 doses of IV medication.  This does seem to be some degree of chronic pain and says he has pain in all of his joints has history of RA.  CT scan does show diarrheal illness which they recommend correlate with stool cultures.  He has not been on any antibiotics recently or travel.  Will defer to hospitalist as does not seem consistent with infectious etiology.  Does have possible pneumonia on his chest x-ray.  Does admit to a chronic cough.  His Covid test is negative.  Will admit for intractable abdominal pain, criteria, likely GI source.  Started on antibiotics per hospitalist  CONSULT with Dr. Westley Foots [facility] who will evaluate patient for admission.   MDM Rules/Calculators/A&P                          ZAELYN BARBARY was evaluated in Emergency Department on  11/11/2020 for the symptoms described in the history of present illness. He was evaluated in the context of the global COVID-19 pandemic, which necessitated consideration that the patient might be at risk for infection with the SARS-CoV-2 virus that causes COVID-19. Institutional protocols and algorithms that pertain to the evaluation of patients at risk for COVID-19 are in a state of rapid change based on information released by regulatory bodies including the CDC and federal and state organizations. These policies and algorithms were followed during the patient's care in the ED. Final Clinical Impression(s) / ED Diagnoses Final diagnoses:  Diarrhea, unspecified type  Intractable abdominal pain  Hyponatremia  Elevated lipase  SIRS (systemic inflammatory response syndrome) ([place])    Rx / DC Orders ED Discharge Orders    None       Shandra Szymborski A, PA-C 11/11/20 1739    Charlesetta Shanks, MD 11/12/20 1110

## 2020-11-11 NOTE — ED Notes (Signed)
Asked patient to give a urine sample. He said he can not go right now. I did leave him with a urinal and told him I would check back at a later time to see if he was able to provide one.

## 2020-11-11 NOTE — ED Notes (Signed)
RN made aware of BP 

## 2020-11-11 NOTE — H&P (Signed)
History and Physical    Jorge Sherman JME:[id number] DOB: 06-26-1941 DOA: 11/11/2020  PCP: Merrilee Seashore, MD   Patient coming from: Home  I have personally briefly reviewed patient's old medical records in [place]  Chief Complaint: Nausea, vomiting and diarrhea  HPI: Jorge Sherman is a 79 y.o. male with medical history significant of diet-controlled diabetes mellitus type 2, hypertension, chronic pain, OSA noncompliant with CPAP, hyperlipidemia, BPH, recent admission from 10/29/2020-11/03/2020 with fall and chest wall/flank pain along with leukocytosis, dehydration and hyponatremia treated with IV fluids and subsequent discharge home despite PT/OT recommending SNF presented today with multiple episodes of vomiting.  Patient states that he has been having nausea and multiple episodes of nonbloody nonbilious vomiting since last night along with multiple episodes of watery diarrhea and midabdominal pain.  Patient denies any melena or bright red blood per rectum.  He also had chills at home.  He was found to have a temperature of 101 in the EMS.  Patient complains of myalgias.  He has not been vaccinated against Covid.  Patient denies any headache, lightheadedness, dizziness, neck pain or neck stiffness, congestion, rhinorrhea, chest pain, hemoptysis, dysuria, hematuria, rashes or lesions.  No recent travel or antibiotic use.  He complains of some cough.  ED Course: He was found to have leukocytosis of 18.3 with sodium of 126, chloride of 88, lipase 52, lactic acid 0.8.  COVID-19 test was negative.  Chest x-ray showed possible left base opacity.  CT of the abdomen and pelvis without contrast showed diarrheal state, no bowel obstruction, sigmoid diverticulosis, no hydronephrosis.  Patient received IV Dilaudid and IV fluids and was started on IV antibiotics. Hospitalist service was called to evaluate the patient.  Review of Systems: As per HPI otherwise all other systems were reviewed  and are negative.   Past Medical History:  Diagnosis Date  . Chronic pain   . Depressive disorder, not elsewhere classified   . Dizziness and giddiness   . Essential and other specified forms of tremor   . Headache(784.0)   . Leukocytosis, unspecified   . Obstructive sleep apnea (adult) (pediatric)   . Pain in joint, lower leg   . Septic joint of left knee joint ([place]) 04/22/2019  . Spinal stenosis, lumbar region, without neurogenic claudication   . Thoracic or lumbosacral neuritis or radiculitis, unspecified   . Type II or unspecified type diabetes mellitus without mention of complication, not stated as uncontrolled   . Unspecified cardiovascular disease   . Unspecified essential hypertension     Past Surgical History:  Procedure Laterality Date  . BACK SURGERY    . back surgey     times 3   Social history  reports that he has quit smoking. He has never used smokeless tobacco. He reports that he does not drink alcohol and does not use drugs.  Lives alone at home  Allergies  Allergen Reactions  . Cefepime Rash    Other reaction(s): Other (See Comments), redness  . Iohexol Hives and Other (See Comments)    A few hives post CT Other reaction(s): hives, few, post CT  . Fentanyl Itching, Nausea And Vomiting and Other (See Comments)    Patient states that it happened over 30 yrs ago  . Iodine Rash    Other reaction(s): Hives/Skin Rash    Family History  Problem Relation Age of Onset  . Cancer Father   . Heart disease Father   . Stroke Mother   . Diabetes Mother  Prior to Admission medications   Medication Sig Start Date End Date Taking? Authorizing Provider  acetaminophen (TYLENOL) 325 MG tablet Take 2 tablets (650 mg total) by mouth every 6 (six) hours as needed for mild pain (or Fever >/= 101). 07/04/20   Georgette Shell, MD  albuterol (VENTOLIN HFA) 108 (90 Base) MCG/ACT inhaler Inhale 2 puffs into the lungs every 4 (four) hours as needed for wheezing or  shortness of breath. 11/03/20   Pokhrel, Corrie Mckusick, MD  amLODipine (NORVASC) 2.5 MG tablet Take 2.5 mg by mouth daily. Patient not taking: Reported on 10/30/2020    [provider]  celecoxib (CELEBREX) 200 MG capsule Take 200 mg by mouth 2 (two) times daily.    [provider]  cloNIDine (CATAPRES) 0.1 MG tablet Take 0.1 mg by mouth 2 (two) times daily.     [provider]  diclofenac Sodium (VOLTAREN) 1 % GEL Apply 2 g topically 4 (four) times daily as needed for pain. 01/02/20   [provider]  DULoxetine (CYMBALTA) 30 MG capsule Take 30 mg by mouth 3 (three) times daily.     [provider]  ferrous sulfate 325 (65 FE) MG tablet Take 325 mg by mouth daily.     [provider]  furosemide (LASIX) 20 MG tablet Take 20 mg by mouth daily as needed. Patient not taking: Reported on 10/30/2020 01/20/20   [provider]  gabapentin (NEURONTIN) 800 MG tablet Take 800 mg by mouth 3 (three) times daily. 06/10/20   [provider]  hydroxychloroquine (PLAQUENIL) 200 MG tablet Take 200 mg by mouth 2 (two) times daily.    [provider]  ipratropium-albuterol (DUONEB) 0.5-2.5 (3) MG/3ML SOLN SMARTSIG:1 Vial(s) Via Nebulizer PRN Patient not taking: Reported on 10/30/2020 02/13/20   [provider]  Iron-FA-B Cmp-C-Biot-Probiotic (FUSION PLUS PO) Take 1 capsule by mouth daily.    [provider]  lidocaine (LIDODERM) 5 % Place 1 patch onto the skin daily. Remove & Discard patch within 12 hours or as directed by MD. Right chest 11/03/20   Pokhrel, Corrie Mckusick, MD  Lidocaine-Menthol 3.6-1.25 % PTCH Place 1 patch onto the skin daily as needed (shoulder pain). 01/02/20   [provider]  linaclotide (LINZESS) 72 MCG capsule Take 72 mcg by mouth daily as needed (constipation).    [provider]  loperamide (IMODIUM A-D) 2 MG tablet Take 1 tablet (2 mg total) by mouth 4 (four) times daily as needed for diarrhea  or loose stools. 07/04/20   Georgette Shell, MD  LORazepam (ATIVAN) 1 MG tablet Take 2 tablets (2 mg total) by mouth every 8 (eight) hours as needed for anxiety. Patient taking differently: Take 1-2 mg by mouth every 8 (eight) hours as needed for anxiety. 07/06/20   Georgette Shell, MD  methocarbamol (ROBAXIN) 500 MG tablet Take 1,000 mg by mouth 3 (three) times daily. 06/15/20   [provider]  NARCAN 4 MG/0.1ML LIQD nasal spray kit Place 1 spray into the nose once. 01/27/20   [provider]  omeprazole (PRILOSEC) 40 MG capsule Take 40 mg by mouth daily.    [provider]  ondansetron (ZOFRAN) 4 MG tablet Take 1 tablet (4 mg total) by mouth every 6 (six) hours as needed for nausea. 07/04/20   Georgette Shell, MD  oxyCODONE-acetaminophen (PERCOCET/ROXICET) 5-325 MG tablet Take 1 tablet by mouth every 6 (six) hours as needed for up to 20 doses for moderate pain or severe pain. 11/03/20  Pokhrel, [place], MD  pantoprazole (PROTONIX) 40 MG tablet Take 40 mg by mouth daily. 11/03/20   [provider]  polyethylene glycol (MIRALAX / GLYCOLAX) 17 g packet Take 17 g by mouth daily as needed for mild constipation. 11/03/20   Pokhrel, Corrie Mckusick, MD  rosuvastatin (CRESTOR) 40 MG tablet Take 40 mg by mouth every evening.     [provider]  saccharomyces boulardii (FLORASTOR) 250 MG capsule Take 1 capsule (250 mg total) by mouth 2 (two) times daily. Patient not taking: No sig reported 07/04/20   Georgette Shell, MD  tamsulosin (FLOMAX) 0.4 MG CAPS capsule Take 0.4 mg by mouth daily.    [provider]    Physical Exam: Vitals:   11/11/20 1256 11/11/20 1335 11/11/20 1400 11/11/20 1511  BP: (!) 173/105  (!) 166/96 (!) 176/92  Pulse: 89  90 93  Resp: 14  14 18   Temp: 98.6 F (37 C)     TempSrc: Oral     SpO2: 96%  95% 94%  Weight:  106.6 kg    Height:  5' 11"  (1.803 m)      Constitutional: NAD, calm, comfortable.  Elderly gentleman  lying in bed.  Looks chronically ill Vitals:   11/11/20 1256 11/11/20 1335 11/11/20 1400 11/11/20 1511  BP: (!) 173/105  (!) 166/96 (!) 176/92  Pulse: 89  90 93  Resp: 14  14 18   Temp: 98.6 F (37 C)     TempSrc: Oral     SpO2: 96%  95% 94%  Weight:  106.6 kg    Height:  5' 11"  (1.803 m)     Eyes: PERRL, lids and conjunctivae normal ENMT: Mucous membranes are dry.  Posterior pharynx clear of any exudate or lesions. Neck: normal, supple, no masses, no thyromegaly Respiratory: bilateral decreased breath sounds at bases, no wheezing, no crackles. Normal respiratory effort. No accessory muscle use.  Cardiovascular: S1 S2 positive, rate controlled.  Trace lower extremity edema. 2+ pedal pulses.  Abdomen: Mild periumbilical tenderness present, no rebound tenderness, no masses palpated. No hepatosplenomegaly. Bowel sounds positive.  Musculoskeletal: no clubbing / cyanosis. No joint deformity upper and lower extremities.  Diffuse tenderness of virtually all the extremities.  Notable pain with passive motion of bilateral shoulders. Skin: no rashes, lesions, ulcers. No induration Neurologic: CN 2-12 grossly intact. Moving extremities. No focal neurologic deficits.  Poor historian. Psychiatric: Flat affect.  Looks intermittently anxious.   Labs on Admission: I have personally reviewed following labs and imaging studies  CBC: Recent Labs  Lab 11/11/20 1330  WBC 18.3*  NEUTROABS 14.7*  HGB 11.8*  HCT 35.2*  MCV 89.8  PLT 591*   Basic Metabolic Panel: Recent Labs  Lab 11/11/20 1330  NA 126*  K 3.4*  CL 88*  CO2 28  GLUCOSE 114*  BUN 13  CREATININE 0.74  CALCIUM 9.4   GFR: Estimated Creatinine Clearance: 93 mL/min (by C-G formula based on SCr of 0.74 mg/dL). Liver Function Tests: Recent Labs  Lab 11/11/20 1330  AST 20  ALT 19  ALKPHOS 56  BILITOT 0.5  PROT 6.9  ALBUMIN 4.0   Recent Labs  Lab 11/11/20 1330  LIPASE 52*   No results for input(s): AMMONIA in the last  168 hours. Coagulation Profile: Recent Labs  Lab 11/11/20 1330  INR 1.0   Cardiac Enzymes: No results for input(s): CKTOTAL, CKMB, CKMBINDEX, TROPONINI in the last 168 hours. BNP (last 3 results) No results for input(s): PROBNP in the last 8760  hours. HbA1C: No results for input(s): HGBA1C in the last 72 hours. CBG: No results for input(s): GLUCAP in the last 168 hours. Lipid Profile: No results for input(s): CHOL, HDL, LDLCALC, TRIG, CHOLHDL, LDLDIRECT in the last 72 hours. Thyroid Function Tests: No results for input(s): TSH, T4TOTAL, FREET4, T3FREE, THYROIDAB in the last 72 hours. Anemia Panel: No results for input(s): VITAMINB12, FOLATE, FERRITIN, TIBC, IRON, RETICCTPCT in the last 72 hours. Urine analysis:    Component Value Date/Time   COLORURINE YELLOW 10/29/2020 2104   APPEARANCEUR CLEAR 10/29/2020 2104   LABSPEC 1.009 10/29/2020 2104   PHURINE 7.0 10/29/2020 2104   GLUCOSEU NEGATIVE 10/29/2020 2104   HGBUR NEGATIVE 10/29/2020 2104   [place] NEGATIVE 10/29/2020 2104   [place] NEGATIVE 10/29/2020 2104   PROTEINUR NEGATIVE 10/29/2020 2104   NITRITE NEGATIVE 10/29/2020 2104   LEUKOCYTESUR NEGATIVE 10/29/2020 2104    Radiological Exams on Admission: CT ABDOMEN PELVIS WO CONTRAST  Result Date: 11/11/2020 CLINICAL DATA:  79 year old male with acute abdominal pain. EXAM: CT ABDOMEN AND PELVIS WITHOUT CONTRAST TECHNIQUE: Multidetector CT imaging of the abdomen and pelvis was performed following the standard protocol without IV contrast. COMPARISON:  CT abdomen pelvis dated 06/29/2020. FINDINGS: Evaluation of this exam is limited in the absence of intravenous contrast. Evaluation is also limited due to severe streak artifact caused by spinal hardware. Lower chest: The visualized lung bases are clear. There is 3 vessel coronary vascular calcification. No intra-abdominal free air or free fluid Hepatobiliary: The liver is unremarkable as visualized. Small stones may be  present within the gallbladder. No pericholecystic fluid or evidence of acute cholecystitis by CT. Pancreas: Unremarkable. No pancreatic ductal dilatation or surrounding inflammatory changes. Spleen: Normal in size without focal abnormality. Adrenals/Urinary Tract: The adrenal glands unremarkable. Indeterminate 2 cm exophytic hypodense lesion from the medial upper pole of the right kidney. There is a 5 mm nonobstructing stone or parenchymal calcification in the interpolar left kidney. There is no hydronephrosis or obstructing stone on either side. The visualized ureters and urinary bladder appear unremarkable. Stomach/Bowel: AP several small scattered sigmoid diverticula without active inflammatory changes. There is loose stool throughout the colon compatible with diarrheal state. Correlation with clinical exam and stool cultures recommended. There is no bowel obstruction or active inflammation. No evidence of acute appendicitis. Vascular/Lymphatic: Advanced aortoiliac atherosclerotic disease. The IVC is unremarkable. No portal venous gas. There is no adenopathy. Reproductive: The prostate and seminal vesicles are grossly unremarkable. No pelvic mass. Other: None Musculoskeletal: Osteopenia with degenerative changes of the spine. L1 vertebroplasty. L2-S1 posterior fusion hardware. No acute osseous pathology. IMPRESSION: 1. Diarrheal state. Correlation with clinical exam and stool cultures recommended. No bowel obstruction. 2. Sigmoid diverticulosis. 3. A 5 mm nonobstructing stone or a focus of parenchymal calcification in the interpolar left kidney. No hydronephrosis or obstructing stone. 4. Aortic Atherosclerosis (ICD10-I70.0). Electronically Signed   By: Anner Crete M.D.   On: 11/11/2020 15:22   DG Chest Port 1 View  Result Date: 11/11/2020 CLINICAL DATA:  Fever EXAM: PORTABLE CHEST 1 VIEW COMPARISON:  October 29, 2020 chest radiograph and chest CT FINDINGS: There is ill-defined opacity in the left base.  Lungs otherwise are clear. Heart size and pulmonary vascularity are normal. No adenopathy. There is arthropathy in the shoulders. IMPRESSION: Ill-defined opacity left base consistent with developing pneumonia. Advise check of COVID-19 status given this appearance. Lungs elsewhere clear. Heart size normal. Electronically Signed   By: Lowella Grip III M.D.   On: 11/11/2020 14:39  Assessment/Plan  Possible acute gastroenteritis presented with nausea, vomiting, abdominal pain and diarrhea -Stool for C. difficile and GI PCR pending. -CT of the abdomen and pelvis without contrast showed diarrheal state with no other acute abnormality -Continue IV fluids.  Pain management.  Clear liquid diet and advance as tolerated.  Antiemetics as needed.  Hyponatremia Dehydration Hypokalemia -Probably from above.  Sodium 126 on presentation with potassium of 3.4 and chloride of 88.  Received IV fluids in the ED.  Continue normal saline with supplemental potassium at 100 cc an hour. -Repeat a.m. labs  Possible community-acquired left lower lobe pneumonia -Rocephin and Zithromax.  Follow cultures.  Urine Legionella and streptococcal antigen.  Currently on room air.  Influenza and COVID-19 testing were negative in the ED  Leukocytosis -Probably from above.  Monitor  Thrombocytosis -Probably reactive.  Monitor  Hypertension -Blood pressure on the higher side.  Continue home regimen.  Use IV antihypertensives if needed  Diabetes mellitus type 2 -Diet controlled.  Continue CBGs with SSI  Chronic pain syndrome -Patient has longstanding chronic pain.  He used to be on Suboxone and oxycodone in the past but currently not on Suboxone.  He was recently discharged on short course of oxycodone.  Complains of pain all over.  He was oxycodone as needed along with gabapentin and Lidoderm patch.  Will need outpatient pain management follow-up.  Coronary artery disease -Stable.  No chest pain.  Continue statin.   Outpatient follow-up with PCP/cardiology  Hyperlipidemia -Continue statin  BPH -Continue Flomax  OSA -Not compliant with CPAP therapy  Obesity -Outpatient follow-up  Patient has not been vaccinated with COVID-19 vaccine.  Discussed with him regarding the same.  He will think about it.   DVT prophylaxis: Lovenox Code Status: DNR Family Communication: None at bedside Disposition Plan: Home in 2 to 3 days once clinically improved Consults called: None Admission status: Inpatient/MedSurg  Severity of Illness: The appropriate patient status for this patient is INPATIENT. Inpatient status is judged to be reasonable and necessary in order to provide the required intensity of service to ensure the patient's safety. The patient's presenting symptoms, physical exam findings, and initial radiographic and laboratory data in the context of their chronic comorbidities is felt to place them at high risk for further clinical deterioration. Furthermore, it is not anticipated that the patient will be medically stable for discharge from the hospital within 2 midnights of admission. The following factors support the patient status of inpatient.   " The patient's presenting symptoms include nausea, vomiting, abdominal pain, diarrhea. " The worrisome physical exam findings include abdominal tenderness, fever in EMS. " The initial radiographic and laboratory data are worrisome because of leukocytosis, hyponatremia, CT showing diarrheal state and chest x-ray showing possible pneumonia. " The chronic co-morbidities include hypertension, chronic pain, coronary artery disease.   * I certify that at the point of admission it is my clinical judgment that the patient will require inpatient hospital care spanning beyond 2 midnights from the point of admission due to high intensity of service, high risk for further deterioration and high frequency of surveillance required.Aline August MD [facility]  11/11/2020, 4:31 PM

## 2020-11-11 NOTE — ED Triage Notes (Signed)
BIB EMS from home. N/V/D since yesterday, abd pain generalized, Temp 101. No exposure to Covid as he knows of. #22 L FA 8 mg Zofran total. 1000mg  Tylenol. 408/14-48-18- 98% RA-CBG 161  NOT VACCINATED

## 2020-11-12 DIAGNOSIS — K529 Noninfective gastroenteritis and colitis, unspecified: Secondary | ICD-10-CM | POA: Diagnosis not present

## 2020-11-12 LAB — CBC
HCT: 34.7 % — ABNORMAL LOW (ref 39.0–52.0)
Hemoglobin: 11.4 g/dL — ABNORMAL LOW (ref 13.0–17.0)
MCH: 30.6 pg (ref 26.0–34.0)
MCHC: 32.9 g/dL (ref 30.0–36.0)
MCV: 93.3 fL (ref 80.0–100.0)
Platelets: 393 10*3/uL (ref 150–400)
RBC: 3.72 MIL/uL — ABNORMAL LOW (ref 4.22–5.81)
RDW: 14 % (ref 11.5–15.5)
WBC: 17.1 10*3/uL — ABNORMAL HIGH (ref 4.0–10.5)
nRBC: 0 % (ref 0.0–0.2)

## 2020-11-12 LAB — C DIFFICILE (CDIFF) QUICK SCRN (NO PCR REFLEX)
C Diff antigen: POSITIVE — AB
C Diff toxin: NEGATIVE

## 2020-11-12 LAB — GASTROINTESTINAL PANEL BY PCR, STOOL (REPLACES STOOL CULTURE)

## 2020-11-12 LAB — COMPREHENSIVE METABOLIC PANEL
ALT: 14 U/L (ref 0–44)
AST: 25 U/L (ref 15–41)
Albumin: 3.6 g/dL (ref 3.5–5.0)
Alkaline Phosphatase: 52 U/L (ref 38–126)
Anion gap: 10 (ref 5–15)
BUN: 14 mg/dL (ref 8–23)
CO2: 24 mmol/L (ref 22–32)
Calcium: 8.8 mg/dL — ABNORMAL LOW (ref 8.9–10.3)
Chloride: 99 mmol/L (ref 98–111)
Creatinine, Ser: 0.67 mg/dL (ref 0.61–1.24)
GFR, Estimated: >60 mL/min (ref >60)
Glucose, Bld: 102 mg/dL — ABNORMAL HIGH (ref 70–99)
Potassium: 4.5 mmol/L (ref 3.5–5.1)
Sodium: 133 mmol/L — ABNORMAL LOW (ref 135–145)
Total Bilirubin: 0.9 mg/dL (ref 0.3–1.2)
Total Protein: 6.5 g/dL (ref 6.5–8.1)

## 2020-11-12 LAB — GLUCOSE, CAPILLARY
Glucose-Capillary: 108 mg/dL — ABNORMAL HIGH (ref 70–99)
Glucose-Capillary: 110 mg/dL — ABNORMAL HIGH (ref 70–99)
Glucose-Capillary: 161 mg/dL — ABNORMAL HIGH (ref 70–99)
Glucose-Capillary: 137 mg/dL — ABNORMAL HIGH (ref 70–99)

## 2020-11-12 LAB — HEMOGLOBIN A1C
Hgb A1c MFr Bld: 5.8 % — ABNORMAL HIGH (ref 4.8–5.6)
Mean Plasma Glucose: 119.76 mg/dL

## 2020-11-12 MED ORDER — BOOST / RESOURCE BREEZE PO LIQD CUSTOM
1.0000 | Freq: Two times a day (BID) | ORAL | Status: DC
  Administered 2020-11-12 – 2020-11-19 (×12): 1 via ORAL

## 2020-11-12 MED ORDER — DIPHENHYDRAMINE HCL 50 MG/ML IJ SOLN
25.0000 mg | Freq: Four times a day (QID) | INTRAMUSCULAR | Status: DC | PRN
  Administered 2020-11-12 – 2020-11-26 (×22): 25 mg via INTRAVENOUS
  Filled 2020-11-12 (×22): qty 1

## 2020-11-12 MED ORDER — PROSOURCE PLUS PO LIQD
30.0000 mL | Freq: Two times a day (BID) | ORAL | Status: DC
  Administered 2020-11-12 – 2020-11-28 (×31): 30 mL via ORAL
  Filled 2020-11-12 (×31): qty 30

## 2020-11-12 MED ORDER — SUMATRIPTAN SUCCINATE 25 MG PO TABS
25.0000 mg | ORAL_TABLET | ORAL | Status: DC | PRN
  Administered 2020-11-12: 10:00:00 25 mg via ORAL
  Filled 2020-11-12 (×3): qty 1

## 2020-11-12 MED ORDER — DEXAMETHASONE SODIUM PHOSPHATE 10 MG/ML IJ SOLN
10.0000 mg | Freq: Four times a day (QID) | INTRAMUSCULAR | Status: DC | PRN
  Administered 2020-11-12 – 2020-11-26 (×23): 10 mg via INTRAVENOUS
  Filled 2020-11-12 (×23): qty 1

## 2020-11-12 MED ORDER — ENOXAPARIN SODIUM 40 MG/0.4ML ~~LOC~~ SOLN
40.0000 mg | SUBCUTANEOUS | Status: DC
  Administered 2020-11-12 – 2020-11-23 (×12): 40 mg via SUBCUTANEOUS
  Filled 2020-11-12 (×12): qty 0.4

## 2020-11-12 MED ORDER — ACETAMINOPHEN 500 MG PO TABS
1000.0000 mg | ORAL_TABLET | Freq: Four times a day (QID) | ORAL | Status: DC | PRN
  Administered 2020-11-12: 12:00:00 1000 mg via ORAL
  Filled 2020-11-12: qty 2

## 2020-11-12 MED ORDER — GABAPENTIN 300 MG PO CAPS
300.0000 mg | ORAL_CAPSULE | Freq: Three times a day (TID) | ORAL | Status: DC
  Administered 2020-11-12 – 2020-11-28 (×48): 300 mg via ORAL
  Filled 2020-11-12 (×48): qty 1

## 2020-11-12 MED ORDER — KETOROLAC TROMETHAMINE 30 MG/ML IJ SOLN
30.0000 mg | Freq: Four times a day (QID) | INTRAMUSCULAR | Status: AC | PRN
  Administered 2020-11-12 – 2020-11-17 (×16): 30 mg via INTRAVENOUS
  Filled 2020-11-12 (×17): qty 1

## 2020-11-12 MED ORDER — LACTATED RINGERS IV SOLN: INTRAVENOUS | Status: DC

## 2020-11-12 MED ORDER — HYDROMORPHONE HCL 1 MG/ML IJ SOLN
0.5000 mg | INTRAMUSCULAR | Status: DC | PRN
  Administered 2020-11-12: 06:00:00 0.5 mg via INTRAVENOUS
  Filled 2020-11-12: qty 0.5

## 2020-11-12 MED ORDER — SUMATRIPTAN SUCCINATE 25 MG PO TABS: 25.0000 mg | ORAL_TABLET | ORAL | Status: DC | PRN

## 2020-11-12 MED ORDER — PROCHLORPERAZINE EDISYLATE 10 MG/2ML IJ SOLN
10.0000 mg | Freq: Four times a day (QID) | INTRAMUSCULAR | Status: DC | PRN
  Administered 2020-11-12 – 2020-11-26 (×22): 10 mg via INTRAVENOUS
  Filled 2020-11-12 (×22): qty 2

## 2020-11-12 NOTE — Progress Notes (Signed)
Initial Nutrition Assessment  DOCUMENTATION CODES:   Obesity unspecified  INTERVENTION:   -Prosource Plus PO BID, each provides 100 kcals and 15g protein -Boost Breeze po BID, each supplement provides 250 kcal and 9 grams of protein  NUTRITION DIAGNOSIS:   Inadequate oral intake related to nausea,vomiting,diarrhea as evidenced by per patient/family report.  GOAL:   Patient will meet greater than or equal to 90% of their needs  MONITOR:   PO intake,Supplement acceptance,Diet advancement,Labs,Weight trends,I & O's  REASON FOR ASSESSMENT:   Malnutrition Screening Tool    ASSESSMENT:   79 y.o. male with medical history significant of diet-controlled diabetes mellitus type 2, hypertension, chronic pain, OSA noncompliant with CPAP, hyperlipidemia, BPH, recent admission from 10/29/2020-11/03/2020 with fall and chest wall/flank pain along with leukocytosis, dehydration and hyponatremia treated with IV fluids and subsequent discharge home despite PT/OT recommending SNF presented with multiple episodes of vomiting.  Patient states that he has been having nausea and multiple episodes of nonbloody nonbilious vomiting since last night along with multiple episodes of watery diarrhea and midabdominal pain.  Patient reporting N/V which started on 12/21. Pt is having a migraine today. States he has not been eating much at home. During previous admission earlier this month, POs were variable and pt reported then that he was not eating well. Pt also having diarrhea. Currently on clears, will order Boost Breeze and Prosource supplements for added kcals and protein.   Per weight records, pt has lost 14 lbs since 8/6 (5% wt loss x 4.5 months, insignificant for time frame).   Medications: Lactated ringers, Compazine, IV Zofran   Labs reviewed: CBGs: 108-110 Low Na C.diff antigen+  NUTRITION - FOCUSED PHYSICAL EXAM:  Deferred.  Diet Order:   Diet Order            Diet clear liquid Room  service appropriate? Yes; Fluid consistency: Thin  Diet effective now                 EDUCATION NEEDS:   No education needs have been identified at this time  Skin:  Skin Assessment: Reviewed RN Assessment  Last BM:  12/23 -type 7, rectal tube placed today  Height:   Ht Readings from Last 1 Encounters:  11/11/20 5\' 11"  (1.803 m)    Weight:   Wt Readings from Last 1 Encounters:  11/11/20 106.6 kg   BMI:  Body mass index is 32.78 kg/m.  Estimated Nutritional Needs:   Kcal:  2100-2300  Protein:  90-100g  Fluid:  2.1L/day  Clayton Bibles, MS, RD, LDN Inpatient Clinical Dietitian Contact information available via Amion

## 2020-11-12 NOTE — Progress Notes (Signed)
PT Cancellation Note  Patient Details Name: Jorge Sherman MRN: [id number] DOB: 02/10/1941   Cancelled Treatment:    Reason Eval/Treat Not Completed: Pain limiting ability to participate (pt c/o severe migraine and stated he "can't move right now." RN notified of pt request for pain medication. Pt stated he was able to transfer to [facility] at home at baseline. Will follow.)   Philomena Doheny PT 11/12/2020  Acute Rehabilitation Services Pager [phone] Office [phone]

## 2020-11-12 NOTE — Progress Notes (Signed)
PROGRESS NOTE    Jorge Sherman  [id number] DOB: [birth year] DOA: 11/11/2020 PCP: Merrilee Seashore, MD     Brief Narrative:  Jorge Sherman is a 79 y.o. male with medical history significant of diet-controlled diabetes mellitus type 2, hypertension, chronic pain, OSA noncompliant with CPAP, hyperlipidemia, BPH, recent admission from 10/29/2020-11/03/2020 with fall and chest wall/flank pain along with leukocytosis, dehydration and hyponatremia treated with IV fluids and subsequent discharge home despite PT/OT recommending SNF presented with multiple episodes of vomiting.  Patient states that he has been having nausea and multiple episodes of nonbloody nonbilious vomiting since last night along with multiple episodes of watery diarrhea and midabdominal pain. He was found to have a temperature of 101 in the EMS.  Patient complains of myalgias.    New events last 24 hours / Subjective: Main complaint this morning is his migraine.  He continues to have diarrhea and some abdominal discomfort.  Denies any cough or shortness of breath.  Assessment & Plan:   Active Problems:   Leukocytosis   Obstructive sleep apnea   Chronic pain syndrome   Essential hypertension, benign   Coronary artery disease involving native coronary artery of native heart without angina pectoris   Abdominal pain   Gastroenteritis   Hyponatremia   Nausea, vomiting, diarrhea -Likely secondary to viral gastroenteritis -CT abdomen pelvis showed diarrheal state. Correlation with clinical exam and stool cultures recommended. No bowel obstruction. -C. difficile positive for antigen, negative toxin which indicates bacteria present but not producing detectable level of toxin. No tx indicated.  -GI PCR pending -Continue clear liquid diet and advance as tolerated, continue supportive care -IVF   CAP, left lower lobe -CXR showed Ill-defined opacity left base consistent with developing pneumonia. -Continue Rocephin,  azithromycin  Hyponatremia -Improving  Hypertension -Continue Catapres  Diabetes mellitus type 2 -Hemoglobin A1c 5.8 -Sliding scale insulin  Chronic pain syndrome -Used to be on Suboxone and oxycodone in the past, not currently on Suboxone  Hyperlipidemia -Continue Crestor  BPH -Continue Flomax  OSA -Noncompliant with CPAP  Migraine -Imitrex as needed   DVT prophylaxis:  enoxaparin (LOVENOX) injection 40 mg Start: 11/12/20 1000  Code Status: DNR Family Communication: No family at bedside Disposition Plan:  Status is: Inpatient  Remains inpatient appropriate because:Persistent severe electrolyte disturbances, Ongoing diagnostic testing needed not appropriate for outpatient work up, IV treatments appropriate due to intensity of illness or inability to take PO and Inpatient level of care appropriate due to severity of illness   Dispo: The patient is from: Home              Anticipated d/c is to: SNF              Anticipated d/c date is: 1 day              Patient currently is not medically stable to d/c.  Continue supportive care as listed above, IV antibiotics.  PT OT pending   Consultants:   None  Procedures:   None  Antimicrobials:  Anti-infectives (From admission, onward)   Start     Dose/Rate Route Frequency Ordered Stop   11/12/20 1000  hydroxychloroquine (PLAQUENIL) tablet 200 mg        200 mg Oral 2 times daily 11/11/20 1630     11/11/20 1800  cefTRIAXone (ROCEPHIN) 2 g in sodium chloride 0.9 % 100 mL IVPB        2 g 200 mL/hr over 30 Minutes Intravenous Every 24 hours 11/11/20 1618  11/11/20 1800  azithromycin (ZITHROMAX) 500 mg in sodium chloride 0.9 % 250 mL IVPB        500 mg 250 mL/hr over 60 Minutes Intravenous Every 24 hours 11/11/20 1618 11/16/20 1759   11/11/20 1645  cefTRIAXone (ROCEPHIN) 2 g in sodium chloride 0.9 % 100 mL IVPB  Status:  Discontinued        2 g 200 mL/hr over 30 Minutes Intravenous Every 24 hours 11/11/20 1630  11/11/20 1705   11/11/20 1645  azithromycin (ZITHROMAX) 500 mg in sodium chloride 0.9 % 250 mL IVPB  Status:  Discontinued        500 mg 250 mL/hr over 60 Minutes Intravenous Every 24 hours 11/11/20 1630 11/11/20 1705   11/11/20 1600  ciprofloxacin (CIPRO) IVPB 400 mg  Status:  Discontinued       "And" Linked Group Details   400 mg 200 mL/hr over 60 Minutes Intravenous  Once 11/11/20 1558 11/11/20 1617   11/11/20 1600  metroNIDAZOLE (FLAGYL) tablet 500 mg  Status:  Discontinued       "And" Linked Group Details   500 mg Oral Every 8 hours 11/11/20 1558 11/11/20 1617        Objective: Vitals:   11/11/20 1707 11/11/20 2022 11/12/20 0624 11/12/20 0826  BP: (!) 154/79 (!) 151/80 (!) 161/81   Pulse: 89 88 95   Resp: 16 16 16    Temp: 97.9 F (36.6 C) 98.2 F (36.8 C)  98.1 F (36.7 C)  TempSrc: Oral Oral  Oral  SpO2: 98% 98% 97%   Weight:      Height:        Intake/Output Summary (Last 24 hours) at 11/12/2020 1015 Last data filed at 11/12/2020 0534 Gross per 24 hour  Intake 1600 ml  Output 1000 ml  Net 600 ml   Filed Weights   11/11/20 1254 11/11/20 1335  Weight: 115.6 kg 106.6 kg    Examination:  General exam: Appears uncomfortable  Respiratory system: Clear to auscultation. Respiratory effort normal. No respiratory distress. No conversational dyspnea. On room air  Cardiovascular system: S1 & S2 heard, RRR. No murmurs. No pedal edema. Gastrointestinal system: Abdomen is nondistended, soft and nontender. Normal bowel sounds heard. Central nervous system: Alert and oriented. No focal neurological deficits. Speech clear.  Extremities: Symmetric in appearance  Skin: No rashes, lesions or ulcers on exposed skin  Psychiatry: Judgement and insight appear normal. Mood & affect appropriate.   Data Reviewed: I have personally reviewed following labs and imaging studies  CBC: Recent Labs  Lab 11/11/20 1330 11/12/20 0543  WBC 18.3* 17.1*  NEUTROABS 14.7*  --   HGB 11.8*  11.4*  HCT 35.2* 34.7*  MCV 89.8 93.3  PLT 458* [id number]   Basic Metabolic Panel: Recent Labs  Lab 11/11/20 1330 11/12/20 0543  NA 126* 133*  K 3.4* 4.5  CL 88* 99  CO2 28 24  GLUCOSE 114* 102*  BUN 13 14  CREATININE 0.74 0.67  CALCIUM 9.4 8.8*   GFR: Estimated Creatinine Clearance: 93 mL/min (by C-G formula based on SCr of 0.67 mg/dL). Liver Function Tests: Recent Labs  Lab 11/11/20 1330 11/12/20 0543  AST 20 25  ALT 19 14  ALKPHOS 56 52  BILITOT 0.5 0.9  PROT 6.9 6.5  ALBUMIN 4.0 3.6   Recent Labs  Lab 11/11/20 1330  LIPASE 52*   No results for input(s): AMMONIA in the last 168 hours. Coagulation Profile: Recent Labs  Lab 11/11/20 1330  INR 1.0  Cardiac Enzymes: No results for input(s): CKTOTAL, CKMB, CKMBINDEX, TROPONINI in the last 168 hours. BNP (last 3 results) No results for input(s): PROBNP in the last 8760 hours. HbA1C: Recent Labs    11/12/20 0543  HGBA1C 5.8*   CBG: Recent Labs  Lab 11/11/20 2018 11/12/20 0737  GLUCAP 163* 108*   Lipid Profile: No results for input(s): CHOL, HDL, LDLCALC, TRIG, CHOLHDL, LDLDIRECT in the last 72 hours. Thyroid Function Tests: No results for input(s): TSH, T4TOTAL, FREET4, T3FREE, THYROIDAB in the last 72 hours. Anemia Panel: No results for input(s): VITAMINB12, FOLATE, FERRITIN, TIBC, IRON, RETICCTPCT in the last 72 hours. Sepsis Labs: Recent Labs  Lab 11/11/20 1330  LATICACIDVEN 0.8    Recent Results (from the past 240 hour(s))  Resp Panel by RT-PCR (Flu A&B, Covid) Nasopharyngeal Swab     Status: None   Collection Time: 11/11/20  2:14 PM   Specimen: Nasopharyngeal Swab; Nasopharyngeal(NP) swabs in vial transport medium  Result Value Ref Range Status   SARS Coronavirus 2 by RT PCR NEGATIVE NEGATIVE Final    Comment: (NOTE) SARS-CoV-2 target nucleic acids are NOT DETECTED.  The SARS-CoV-2 RNA is generally detectable in upper respiratory specimens during the acute phase of infection. The  lowest concentration of SARS-CoV-2 viral copies this assay can detect is 138 copies/mL. A negative result does not preclude SARS-Cov-2 infection and should not be used as the sole basis for treatment or other patient management decisions. A negative result may occur with  improper specimen collection/handling, submission of specimen other than nasopharyngeal swab, presence of viral mutation(s) within the areas targeted by this assay, and inadequate number of viral copies(<138 copies/mL). A negative result must be combined with clinical observations, patient history, and epidemiological information. The expected result is Negative.  Fact Sheet for Patients:  EntrepreneurPulse.com.au  Fact Sheet for Healthcare Providers:  IncredibleEmployment.be  This test is no t yet approved or cleared by the [place] FDA and  has been authorized for detection and/or diagnosis of SARS-CoV-2 by FDA under an Emergency Use Authorization (EUA). This EUA will remain  in effect (meaning this test can be used) for the duration of the COVID-19 declaration under Section 564(b)(1) of the Act, 21 U.S.C.section 360bbb-3(b)(1), unless the authorization is terminated  or revoked sooner.       Influenza A by PCR NEGATIVE NEGATIVE Final   Influenza B by PCR NEGATIVE NEGATIVE Final    Comment: (NOTE) The Xpert Xpress SARS-CoV-2/FLU/RSV plus assay is intended as an aid in the diagnosis of influenza from Nasopharyngeal swab specimens and should not be used as a sole basis for treatment. Nasal washings and aspirates are unacceptable for Xpert Xpress SARS-CoV-2/FLU/RSV testing.  Fact Sheet for Patients: EntrepreneurPulse.com.au  Fact Sheet for Healthcare Providers: IncredibleEmployment.be  This test is not yet approved or cleared by the [place] FDA and has been authorized for detection and/or diagnosis of SARS-CoV-2 by FDA under  an Emergency Use Authorization (EUA). This EUA will remain in effect (meaning this test can be used) for the duration of the COVID-19 declaration under Section 564(b)(1) of the Act, 21 U.S.C. section 360bbb-3(b)(1), unless the authorization is terminated or revoked.  Performed at [facility], [place] [address], [place], [place] [address]   C Difficile Quick Screen (NO PCR Reflex)     Status: Abnormal   Collection Time: 11/12/20  6:42 AM   Specimen: STOOL  Result Value Ref Range Status   C Diff antigen POSITIVE (A) NEGATIVE Final   C  Diff toxin NEGATIVE NEGATIVE Final   C Diff interpretation   Final    Results are indeterminate. Please contact the provider listed for your campus for C diff questions in [place].    Comment: Performed at [facility], [place] [address] [place], [place] [address]      Radiology Studies: CT ABDOMEN PELVIS WO CONTRAST  Result Date: 11/11/2020 CLINICAL DATA:  79 year old male with acute abdominal pain. EXAM: CT ABDOMEN AND PELVIS WITHOUT CONTRAST TECHNIQUE: Multidetector CT imaging of the abdomen and pelvis was performed following the standard protocol without IV contrast. COMPARISON:  CT abdomen pelvis dated 06/29/2020. FINDINGS: Evaluation of this exam is limited in the absence of intravenous contrast. Evaluation is also limited due to severe streak artifact caused by spinal hardware. Lower chest: The visualized lung bases are clear. There is 3 vessel coronary vascular calcification. No intra-abdominal free air or free fluid Hepatobiliary: The liver is unremarkable as visualized. Small stones may be present within the gallbladder. No pericholecystic fluid or evidence of acute cholecystitis by CT. Pancreas: Unremarkable. No pancreatic ductal dilatation or surrounding inflammatory changes. Spleen: Normal in size without focal abnormality. Adrenals/Urinary Tract: The adrenal glands unremarkable. Indeterminate 2 cm exophytic hypodense  lesion from the medial upper pole of the right kidney. There is a 5 mm nonobstructing stone or parenchymal calcification in the interpolar left kidney. There is no hydronephrosis or obstructing stone on either side. The visualized ureters and urinary bladder appear unremarkable. Stomach/Bowel: AP several small scattered sigmoid diverticula without active inflammatory changes. There is loose stool throughout the colon compatible with diarrheal state. Correlation with clinical exam and stool cultures recommended. There is no bowel obstruction or active inflammation. No evidence of acute appendicitis. Vascular/Lymphatic: Advanced aortoiliac atherosclerotic disease. The IVC is unremarkable. No portal venous gas. There is no adenopathy. Reproductive: The prostate and seminal vesicles are grossly unremarkable. No pelvic mass. Other: None Musculoskeletal: Osteopenia with degenerative changes of the spine. L1 vertebroplasty. L2-S1 posterior fusion hardware. No acute osseous pathology. IMPRESSION: 1. Diarrheal state. Correlation with clinical exam and stool cultures recommended. No bowel obstruction. 2. Sigmoid diverticulosis. 3. A 5 mm nonobstructing stone or a focus of parenchymal calcification in the interpolar left kidney. No hydronephrosis or obstructing stone. 4. Aortic Atherosclerosis (ICD10-I70.0). Electronically Signed   By: Anner Crete M.D.   On: 11/11/2020 15:22   DG Chest Port 1 View  Result Date: 11/11/2020 CLINICAL DATA:  Fever EXAM: PORTABLE CHEST 1 VIEW COMPARISON:  October 29, 2020 chest radiograph and chest CT FINDINGS: There is ill-defined opacity in the left base. Lungs otherwise are clear. Heart size and pulmonary vascularity are normal. No adenopathy. There is arthropathy in the shoulders. IMPRESSION: Ill-defined opacity left base consistent with developing pneumonia. Advise check of COVID-19 status given this appearance. Lungs elsewhere clear. Heart size normal. Electronically Signed   By:  Lowella Grip III M.D.   On: 11/11/2020 14:39      Scheduled Meds: . celecoxib  200 mg Oral BID  . cloNIDine  0.1 mg Oral BID  . DULoxetine  30 mg Oral TID  . enoxaparin (LOVENOX) injection  40 mg Subcutaneous Q24H  . gabapentin  800 mg Oral TID  . hydroxychloroquine  200 mg Oral BID  . insulin aspart  0-15 Units Subcutaneous TID WC  . insulin aspart  0-5 Units Subcutaneous QHS  . methocarbamol  1,000 mg Oral TID  . pantoprazole  40 mg Oral Daily  . rosuvastatin  40 mg Oral QPM  .  tamsulosin  0.4 mg Oral QPC supper   Continuous Infusions: . 0.9 % NaCl with KCl 40 mEq / L 100 mL/hr at 11/12/20 0515  . azithromycin 500 mg (11/11/20 1758)  . cefTRIAXone (ROCEPHIN)  IV 2 g (11/11/20 1757)     LOS: 1 day      Time spent: 35 minutes   Dessa Phi, DO [facility] 11/12/2020, 10:15 AM   Available via Epic secure chat 7am-7pm After these hours, please refer to coverage provider listed on [email or web address]

## 2020-11-12 NOTE — Progress Notes (Signed)
OT Cancellation Note  Patient Details Name: Jorge Sherman MRN: [id number] DOB: April 25, 1941   Cancelled Treatment:    Reason Eval/Treat Not Completed: Pain limiting ability to participate. Patient reports a "Horrible migraine" and unable to participate at this time. Rn notified of patient's pain med request. Will f/u as able.  Ilean Spradlin L Manu Rubey 11/12/2020, 10:15 AM

## 2020-11-13 DIAGNOSIS — K529 Noninfective gastroenteritis and colitis, unspecified: Secondary | ICD-10-CM | POA: Diagnosis not present

## 2020-11-13 LAB — CBC
HCT: 35.5 % — ABNORMAL LOW (ref 39.0–52.0)
Hemoglobin: 11.5 g/dL — ABNORMAL LOW (ref 13.0–17.0)
MCH: 29.9 pg (ref 26.0–34.0)
MCHC: 32.4 g/dL (ref 30.0–36.0)
MCV: 92.2 fL (ref 80.0–100.0)
Platelets: 414 10*3/uL — ABNORMAL HIGH (ref 150–400)
RBC: 3.85 MIL/uL — ABNORMAL LOW (ref 4.22–5.81)
RDW: 13.3 % (ref 11.5–15.5)
WBC: 15 10*3/uL — ABNORMAL HIGH (ref 4.0–10.5)
nRBC: 0 % (ref 0.0–0.2)

## 2020-11-13 LAB — GLUCOSE, CAPILLARY
Glucose-Capillary: 147 mg/dL — ABNORMAL HIGH (ref 70–99)
Glucose-Capillary: 228 mg/dL — ABNORMAL HIGH (ref 70–99)
Glucose-Capillary: 145 mg/dL — ABNORMAL HIGH (ref 70–99)

## 2020-11-13 LAB — BASIC METABOLIC PANEL
Anion gap: 10 (ref 5–15)
BUN: 14 mg/dL (ref 8–23)
CO2: 21 mmol/L — ABNORMAL LOW (ref 22–32)
Calcium: 9.4 mg/dL (ref 8.9–10.3)
Chloride: 102 mmol/L (ref 98–111)
Creatinine, Ser: 0.71 mg/dL (ref 0.61–1.24)
GFR, Estimated: >60 mL/min (ref >60)
Glucose, Bld: 151 mg/dL — ABNORMAL HIGH (ref 70–99)
Potassium: 3.7 mmol/L (ref 3.5–5.1)
Sodium: 133 mmol/L — ABNORMAL LOW (ref 135–145)

## 2020-11-13 LAB — LEGIONELLA PNEUMOPHILA SEROGP 1 UR AG: L. pneumophila Serogp 1 Ur Ag: NEGATIVE

## 2020-11-13 MED ORDER — AZITHROMYCIN 250 MG PO TABS
500.0000 mg | ORAL_TABLET | Freq: Every day | ORAL | Status: DC
  Administered 2020-11-13: 17:00:00 500 mg via ORAL
  Filled 2020-11-13: qty 2

## 2020-11-13 MED ORDER — LORAZEPAM 2 MG/ML IJ SOLN
1.0000 mg | Freq: Four times a day (QID) | INTRAMUSCULAR | Status: DC | PRN
  Administered 2020-11-23: 1 mg via INTRAMUSCULAR
  Filled 2020-11-13: qty 1

## 2020-11-13 MED ORDER — LORAZEPAM 1 MG PO TABS
1.0000 mg | ORAL_TABLET | Freq: Four times a day (QID) | ORAL | Status: DC | PRN
  Administered 2020-11-13 – 2020-11-28 (×35): 1 mg via ORAL
  Filled 2020-11-13 (×35): qty 1

## 2020-11-13 MED ORDER — METHOCARBAMOL 1000 MG/10ML IJ SOLN
500.0000 mg | Freq: Four times a day (QID) | INTRAVENOUS | Status: DC | PRN
  Administered 2020-11-13 – 2020-11-15 (×8): 500 mg via INTRAVENOUS
  Filled 2020-11-13: qty 500
  Filled 2020-11-13 (×2): qty 5
  Filled 2020-11-13 (×3): qty 500
  Filled 2020-11-13: qty 5
  Filled 2020-11-13 (×2): qty 500

## 2020-11-13 NOTE — Progress Notes (Signed)
Pharmacy IV to PO conversion  This patient is receiving azithromycin by the intravenous route. Based on criteria approved by the Pharmacy and Therapeutics Committee, and the Infectious Disease Division, the antibiotic(s) is/are being converted to equivalent oral dose form(s). These criteria include:   Patient being treated for a respiratory tract infection, urinary tract infection, cellulitis, or Clostridium Difficile Associated Diarrhea  The patient is not neutropenic and does not exhibit a GI malabsorption state  The patient is eating (either orally or per tube) and/or has been taking other orally administered medications for at least 24 hours.  The patient is improving clinically (physician assessment and a 24-hour Tmax of <=100.5 F)  If you have any questions about this conversion, please contact the Pharmacy Department (ext [phone]).  Thank you.  Reuel Boom, PharmD, BCPS [phone] 11/13/2020, 1:59 PM

## 2020-11-13 NOTE — Evaluation (Signed)
Physical Therapy Evaluation Patient Details Name: Jorge Sherman MRN: [id number] DOB: 11/02/1941 Today's Date: 11/13/2020   History of Present Illness  Jorge Sherman is a 79 y.o. male with medical history significant of diet-controlled diabetes mellitus type 2, hypertension, chronic pain, OSA noncompliant with CPAP, hyperlipidemia, BPH, recent admission from 10/29/2020-11/03/2020 with fall and chest wall/flank pain along with leukocytosis, dehydration and hyponatremia treated with IV fluids and subsequent discharge home despite PT/OT recommending SNF presented with multiple episodes of vomiting.  Patient states that he has been having nausea and multiple episodes of nonbloody nonbilious vomiting since last night along with multiple episodes of watery diarrhea and midabdominal pain. He was found to have a temperature of 101 in the EMS.  Patient complains of myalgias  Clinical Impression  Pt admitted as above and presenting with functional mobility limitations 2* generalized weakness, deconditioning, balance deficits and questionable safety awareness.  Pt would benefit from follow up rehab at SNF level to maximize IND and safety but states he has PCA assist at home and can expand it to 24/7 assist.  Home may be an option if 24/7 assist is available at the level pt needs at point of dc.    Follow Up Recommendations SNF;Supervision/Assistance - 24 hour;Home health PT    Equipment Recommendations  None recommended by PT    Recommendations for Other Services OT consult     Precautions / Restrictions Precautions Precautions: Fall Precaution Comments: severe bilateral shoulder pain, pt states "my shoulders are broken" Restrictions Weight Bearing Restrictions: No      Mobility  Bed Mobility Overal bed mobility: Needs Assistance Bed Mobility: Rolling;Supine to Sit;Sit to Sidelying Rolling: Min assist   Supine to sit: Min assist;+2 for physical assistance;+2 for safety/equipment   Sit to  sidelying: Min assist General bed mobility comments: Assist to EOB sitting utilizing pad 2* perianeal discomfort;  Returned to be with move to side ly requiring assist with LEs and then roll to supine    Transfers Overall transfer level: Needs assistance Equipment used: Rolling walker (2 wheeled) Transfers: Sit to/from Stand Sit to Stand: Min assist;From elevated surface         General transfer comment: cues for use of UEs; elevated bed; assist to bring wt up and fwd and to balance in standing with RW.  Pt moved sit<>stand x 6 times to allow performance of hygiene, barrier cream application, and change of bed linens  Ambulation/Gait Ambulation/Gait assistance: Min assist;+2 safety/equipment Gait Distance (Feet): 3 Feet Assistive device: Rolling walker (2 wheeled) Gait Pattern/deviations: Step-to pattern;Decreased step length - right;Decreased step length - left;Shuffle;Trunk flexed Gait velocity: decr   General Gait Details: able to take sidesteps alongside EOB with MinA/cues for sequencing/safety with RW  Stairs            Wheelchair Mobility    Modified Rankin (Stroke Patients Only)       Balance Overall balance assessment: History of Falls;Needs assistance Sitting-balance support: Feet supported Sitting balance-Leahy Scale: Good     Standing balance support: Bilateral upper extremity supported;During functional activity Standing balance-Leahy Scale: Poor Standing balance comment: reliant on external support                             Pertinent Vitals/Pain Pain Assessment: Faces Faces Pain Scale: Hurts even more Pain Location: shoulders and perianal area Pain Descriptors / Indicators: Sore;Discomfort;Grimacing Pain Intervention(s): Limited activity within patient's tolerance;Monitored during session;Premedicated before session;Other (comment) (RN in  to apply barrier cream and foam pad)    Home Living Family/patient expects to be discharged to::  Private residence Living Arrangements: Alone Available Help at Discharge: Personal care attendant;Available PRN/intermittently (can be 24/7) Type of Home: Other(Comment) (luxury condo) Home Access: Level entry     Home Layout: One level Home Equipment: Walker - 2 wheels;Wheelchair - power      Prior Function Level of Independence: Needs assistance   Gait / Transfers Assistance Needed: pt reports recent falls, mostly transfer level from bed<>wheelchair, wheelchair<>recliner  ADL's / Homemaking Assistance Needed: Pt reports assist for ADLs when aides are there, otherwise "I do the best I can". Pt has a microwave sitting on his kitchen table to prepare meals from fridge        Hand Dominance   Dominant Hand: Right    Extremity/Trunk Assessment   Upper Extremity Assessment Upper Extremity Assessment: Generalized weakness;Defer to OT evaluation    Lower Extremity Assessment Lower Extremity Assessment: Generalized weakness RLE Deficits / Details: ROM WFL but strength ltd LLE Deficits / Details: see R LE    Cervical / Trunk Assessment Cervical / Trunk Assessment: Kyphotic  Communication   Communication: No difficulties  Cognition Arousal/Alertness: Awake/alert Behavior During Therapy: Impulsive;Anxious;Restless Overall Cognitive Status: No family/caregiver present to determine baseline cognitive functioning Area of Impairment: Safety/judgement;Problem solving;Memory                     Memory: Decreased short-term memory Following Commands: Follows one step commands consistently;Follows one step commands with increased time Safety/Judgement: Decreased awareness of safety;Decreased awareness of deficits   Problem Solving: Requires verbal cues;Difficulty sequencing General Comments: significant loss of awareness of deficits/safety, really seems to have no sense of functional impairments or problem solving.      General Comments      Exercises      Assessment/Plan    PT Assessment Patient needs continued PT services  PT Problem List Decreased strength;Decreased mobility;Decreased balance;Decreased knowledge of use of DME;Pain;Decreased activity tolerance;Obesity;Decreased range of motion;Decreased safety awareness;Decreased coordination       PT Treatment Interventions DME instruction;Therapeutic activities;Gait training;Therapeutic exercise;Patient/family education;Balance training;Functional mobility training;Neuromuscular re-education;Wheelchair mobility training    PT Goals (Current goals can be found in the Care Plan section)  Acute Rehab PT Goals Patient Stated Goal: go home PT Goal Formulation: With patient Time For Goal Achievement: 11/13/20 Potential to Achieve Goals: Fair    Frequency Min 3X/week   Barriers to discharge Decreased caregiver support Pt states he has caregivers for two shifts but can arrange 24/7    Co-evaluation               AM-PAC PT "6 Clicks" Mobility  Outcome Measure Help needed turning from your back to your side while in a flat bed without using bedrails?: A Little Help needed moving from lying on your back to sitting on the side of a flat bed without using bedrails?: A Lot Help needed moving to and from a bed to a chair (including a wheelchair)?: A Lot Help needed standing up from a chair using your arms (e.g., wheelchair or bedside chair)?: A Lot Help needed to walk in hospital room?: Total Help needed climbing 3-5 steps with a railing? : Total 6 Click Score: 11    End of Session Equipment Utilized During Treatment: Gait belt Activity Tolerance: Patient limited by fatigue;Patient limited by pain Patient left: in bed;with call bell/phone within reach;with bed alarm set;with nursing/sitter in room Nurse Communication: Mobility status PT  Visit Diagnosis: Muscle weakness (generalized) (M62.81);Other abnormalities of gait and mobility (R26.89);Pain    Time: 1310-1348 PT Time  Calculation (min) (ACUTE ONLY): 38 min   Charges:   PT Evaluation $PT Eval Moderate Complexity: 1 Mod PT Treatments $Therapeutic Activity: 23-37 mins        Debe Coder PT Acute Rehabilitation Services Pager [phone] Office [phone]   Girtrude Enslin 11/13/2020, 2:35 PM

## 2020-11-13 NOTE — Evaluation (Signed)
Occupational Therapy Evaluation Patient Details Name: Jorge Sherman MRN: [id number] DOB: Mar 29, 1941 Today's Date: 11/13/2020    History of Present Illness [name] is a 79 y.o. male with medical history significant of diet-controlled diabetes mellitus type 2, hypertension, chronic pain, OSA noncompliant with CPAP, hyperlipidemia, BPH, recent admission from 10/29/2020-11/03/2020 with fall and chest wall/flank pain along with leukocytosis, dehydration and hyponatremia treated with IV fluids and subsequent discharge home despite PT/OT recommending SNF presented with multiple episodes of vomiting.  Patient states that he has been having nausea and multiple episodes of nonbloody nonbilious vomiting since last night along with multiple episodes of watery diarrhea and midabdominal pain. He was found to have a temperature of 101 in the EMS.  Patient complains of myalgias   Clinical Impression   Pt admitted with abdominal pain. Pt currently with functional limitations due to the deficits listed below (see OT Problem List).  Pt will benefit from skilled OT to increase their safety and independence with ADL and functional mobility for ADL to facilitate discharge to venue listed below.      Follow Up Recommendations  SNF (SNF unless going to [place] with HHOT)          Precautions / Restrictions Precautions Precautions: Fall      Mobility Bed Mobility Overal bed mobility: Needs Assistance Bed Mobility: Rolling           General bed mobility comments: total A with repositioning in bed    Transfers                 General transfer comment: did not perform    Balance Overall balance assessment: History of Falls;Needs assistance Sitting-balance support: Feet supported Sitting balance-Leahy Scale: Fair     Standing balance support: Bilateral upper extremity supported;During functional activity Standing balance-Leahy Scale: Poor Standing balance comment: reliant on  external support                           ADL either performed or assessed with clinical judgement   ADL Overall ADL's : Needs assistance/impaired Eating/Feeding: Sitting;Minimal assistance   Grooming: Wash/dry face;Minimal assistance                                 General ADL Comments: limited participation with OT.  Pt with many complaints.CNA Aed OT reposition pt. Pt declined to attempt sitting up EOB     Vision Baseline Vision/History: No visual deficits              Pertinent Vitals/Pain Faces Pain Scale: Hurts even more Pain Location: shoulders and L LE "sciatic pain" Pain Descriptors / Indicators: Sore;Discomfort;Grimacing Pain Intervention(s): Limited activity within patient's tolerance;Monitored during session;Repositioned;Patient requesting pain meds-RN notified        Extremity/Trunk Assessment Upper Extremity Assessment Upper Extremity Assessment: Generalized weakness           Communication Communication Communication: No difficulties   Cognition Arousal/Alertness: Awake/alert Behavior During Therapy: Impulsive;Anxious;Restless Overall Cognitive Status: Impaired/Different from baseline Area of Impairment: Safety/judgement;Problem solving;Memory                     Memory: Decreased short-term memory Following Commands: Follows one step commands consistently;Follows one step commands with increased time Safety/Judgement: Decreased awareness of safety;Decreased awareness of deficits   Problem Solving: Requires verbal cues;Difficulty sequencing;Decreased initiation General Comments: significant loss of awareness of deficits/safety, really seems  to have no sense of functional impairments or problem solving.              Home Living Family/patient expects to be discharged to:: Private residence Living Arrangements: Alone Available Help at Discharge: Personal care attendant;Available PRN/intermittently (caregivers  3-4 hours per day 3x per week) Type of Home: Other(Comment) (condo) Home Access: Level entry     Home Layout: One level     Bathroom Shower/Tub: Occupational psychologist: Handicapped height     Home Equipment: Environmental consultant - 2 wheels;Wheelchair - power          Prior Functioning/Environment Level of Independence: Needs assistance  Gait / Transfers Assistance Needed: pt reports recent falls, mostly transfer level from bed<>wheelchair, wheelchair<>recliner ADL's / Homemaking Assistance Needed: Pt reports assist for ADLs when aides are there, otherwise "I do the best I can". Pt has a microwave sitting on his kitchen table to prepare meals from fridge            OT Problem List: Decreased strength;Decreased knowledge of use of DME or AE;Decreased activity tolerance;Decreased cognition;Impaired balance (sitting and/or standing);Decreased safety awareness;Impaired UE functional use      OT Treatment/Interventions: Self-care/ADL training;Therapeutic exercise;Patient/family education;Balance training;Energy conservation;Therapeutic activities;DME and/or AE instruction    OT Goals(Current goals can be found in the care plan section) Acute Rehab OT Goals Patient Stated Goal: go home OT Goal Formulation: With patient Time For Goal Achievement: 11/15/20 Potential to Achieve Goals: Good  OT Frequency: Min 2X/week    AM-PAC OT "6 Clicks" Daily Activity     Outcome Measure Help from another person eating meals?: A Lot Help from another person taking care of personal grooming?: A Lot Help from another person toileting, which includes using toliet, bedpan, or urinal?: Total Help from another person bathing (including washing, rinsing, drying)?: Total Help from another person to put on and taking off regular upper body clothing?: A Lot Help from another person to put on and taking off regular lower body clothing?: Total 6 Click Score: 9   End of Session Nurse Communication: Mobility  status  Activity Tolerance: Patient limited by fatigue Patient left: in bed;with call bell/phone within reach;with bed alarm set;with nursing/sitter in room  OT Visit Diagnosis: Unsteadiness on feet (R26.81);Other abnormalities of gait and mobility (R26.89);Muscle weakness (generalized) (M62.81);History of falling (Z91.81);Repeated falls (R29.6)                Time: 1005-1026 OT Time Calculation (min): 21 min Charges:  OT General Charges $OT Visit: 1 Visit OT Evaluation $OT Eval Moderate Complexity: 1 Mod  Kari Baars, [place] [phone] Office- [phone], Edwena Felty D 11/13/2020, 1:58 PM

## 2020-11-13 NOTE — Progress Notes (Addendum)
PROGRESS NOTE    Jorge Sherman  GGE:[id number] DOB: 02-Aug-1941 DOA: 11/11/2020 PCP: Merrilee Seashore, MD     Brief Narrative:  Jorge Sherman is a 79 y.o. male with medical history significant of diet-controlled diabetes mellitus type 2, hypertension, chronic pain, OSA noncompliant with CPAP, hyperlipidemia, BPH, recent admission from 10/29/2020-11/03/2020 with fall and chest wall/flank pain along with leukocytosis, dehydration and hyponatremia treated with IV fluids and subsequent discharge home despite PT/OT recommending SNF presented with multiple episodes of vomiting.  Patient states that he has been having nausea and multiple episodes of nonbloody nonbilious vomiting since last night along with multiple episodes of watery diarrhea and midabdominal pain. He was found to have a temperature of 101 in the EMS.  Patient complains of myalgias.    New events last 24 hours / Subjective: Lot of anxiety this morning. Admits to continued headache, although sounds like migraine cocktail has helped. States he has not slept in 16 hours, but then goes on to say that he was woken up by RN early this morning.  No further nausea or vomiting.  Flexi-Seal in place which reveal loose stool  Assessment & Plan:   Active Problems:   Leukocytosis   Obstructive sleep apnea   Chronic pain syndrome   Essential hypertension, benign   Coronary artery disease involving native coronary artery of native heart without angina pectoris   Abdominal pain   Gastroenteritis   Hyponatremia   Nausea, vomiting, diarrhea -Likely secondary to viral gastroenteritis -CT abdomen pelvis showed diarrheal state. Correlation with clinical exam and stool cultures recommended. No bowel obstruction. -C. difficile positive for antigen, negative toxin which indicates bacteria present but not producing detectable level of toxin. No tx indicated.  -GI PCR negative  -Continue to advance diet as tolerated  CAP, left lower lobe -CXR  showed Ill-defined opacity left base consistent with developing pneumonia. -Continue Rocephin, azithromycin x 5 days -WBC trending down  Hyponatremia -Improved and stable  Hypertension -Continue Catapres  Diabetes mellitus type 2 -Hemoglobin A1c 5.8 -Sliding scale insulin  Chronic pain syndrome -Used to be on Suboxone and oxycodone in the past, not currently on Suboxone  Hyperlipidemia -Continue Crestor  BPH -Continue Flomax  OSA -Noncompliant with CPAP  Migraine -Migraine cocktail ordered every 6 hours as needed  Anxiety -Resume home Ativan as needed   DVT prophylaxis:  enoxaparin (LOVENOX) injection 40 mg Start: 11/12/20 1000  Code Status: DNR Family Communication: No family at bedside; updated daughter today over the phone  Disposition Plan:  Status is: Inpatient  Remains inpatient appropriate because:Unsafe d/c plan and IV treatments appropriate due to intensity of illness or inability to take PO   Dispo: The patient is from: Home              Anticipated d/c is to: SNF              Anticipated d/c date is: 1 day              Patient currently is not medically stable to d/c.  His abdominal complaints have improved.  Continue treatment for migraines.  PT OT pending   Consultants:   None  Procedures:   None  Antimicrobials:  Anti-infectives (From admission, onward)   Start     Dose/Rate Route Frequency Ordered Stop   11/12/20 1000  hydroxychloroquine (PLAQUENIL) tablet 200 mg        200 mg Oral 2 times daily 11/11/20 1630     11/11/20 1800  cefTRIAXone (  ROCEPHIN) 2 g in sodium chloride 0.9 % 100 mL IVPB        2 g 200 mL/hr over 30 Minutes Intravenous Every 24 hours 11/11/20 1618     11/11/20 1800  azithromycin (ZITHROMAX) 500 mg in sodium chloride 0.9 % 250 mL IVPB        500 mg 250 mL/hr over 60 Minutes Intravenous Every 24 hours 11/11/20 1618 11/16/20 1759   11/11/20 1645  cefTRIAXone (ROCEPHIN) 2 g in sodium chloride 0.9 % 100 mL IVPB   Status:  Discontinued        2 g 200 mL/hr over 30 Minutes Intravenous Every 24 hours 11/11/20 1630 11/11/20 1705   11/11/20 1645  azithromycin (ZITHROMAX) 500 mg in sodium chloride 0.9 % 250 mL IVPB  Status:  Discontinued        500 mg 250 mL/hr over 60 Minutes Intravenous Every 24 hours 11/11/20 1630 11/11/20 1705   11/11/20 1600  ciprofloxacin (CIPRO) IVPB 400 mg  Status:  Discontinued       "And" Linked Group Details   400 mg 200 mL/hr over 60 Minutes Intravenous  Once 11/11/20 1558 11/11/20 1617   11/11/20 1600  metroNIDAZOLE (FLAGYL) tablet 500 mg  Status:  Discontinued       "And" Linked Group Details   500 mg Oral Every 8 hours 11/11/20 1558 11/11/20 1617       Objective: Vitals:   11/12/20 0826 11/12/20 1514 11/12/20 2010 11/13/20 0443  BP:  (!) 182/100 (!) 167/92 (!) 168/91  Pulse:  99 90 91  Resp:  18 16 20   Temp: 98.1 F (36.7 C) 97.6 F (36.4 C) 98.2 F (36.8 C) 97.9 F (36.6 C)  TempSrc: Oral  Oral Oral  SpO2:  97% 96% 97%  Weight:      Height:        Intake/Output Summary (Last 24 hours) at 11/13/2020 1051 Last data filed at 11/13/2020 0446 Gross per 24 hour  Intake 2002.89 ml  Output 1500 ml  Net 502.89 ml   Filed Weights   11/11/20 1254 11/11/20 1335  Weight: 115.6 kg 106.6 kg    Examination: General exam: Appears calm and comfortable  Respiratory system: Clear to auscultation. Respiratory effort normal. Cardiovascular system: S1 & S2 heard, RRR. No pedal edema. Gastrointestinal system: Abdomen is nondistended, soft and nontender. Normal bowel sounds heard. Central nervous system: Alert and oriented. Non focal exam. Speech clear  Extremities: Symmetric in appearance bilaterally  Skin: No rashes, lesions or ulcers on exposed skin  Psychiatry: Anxious this morning  Data Reviewed: I have personally reviewed following labs and imaging studies  CBC: Recent Labs  Lab 11/11/20 1330 11/12/20 0543 11/13/20 0801  WBC 18.3* 17.1* 15.0*  NEUTROABS  14.7*  --   --   HGB 11.8* 11.4* 11.5*  HCT 35.2* 34.7* 35.5*  MCV 89.8 93.3 92.2  PLT 458* 393 161*   Basic Metabolic Panel: Recent Labs  Lab 11/11/20 1330 11/12/20 0543 11/13/20 0801  NA 126* 133* 133*  K 3.4* 4.5 3.7  CL 88* 99 102  CO2 28 24 21*  GLUCOSE 114* 102* 151*  BUN 13 14 14   CREATININE 0.74 0.67 0.71  CALCIUM 9.4 8.8* 9.4   GFR: Estimated Creatinine Clearance: 93 mL/min (by C-G formula based on SCr of 0.71 mg/dL). Liver Function Tests: Recent Labs  Lab 11/11/20 1330 11/12/20 0543  AST 20 25  ALT 19 14  ALKPHOS 56 52  BILITOT 0.5 0.9  PROT 6.9 6.5  ALBUMIN 4.0 3.6   Recent Labs  Lab 11/11/20 1330  LIPASE 52*   No results for input(s): AMMONIA in the last 168 hours. Coagulation Profile: Recent Labs  Lab 11/11/20 1330  INR 1.0   Cardiac Enzymes: No results for input(s): CKTOTAL, CKMB, CKMBINDEX, TROPONINI in the last 168 hours. BNP (last 3 results) No results for input(s): PROBNP in the last 8760 hours. HbA1C: Recent Labs    11/12/20 0543  HGBA1C 5.8*   CBG: Recent Labs  Lab 11/12/20 0737 11/12/20 1223 11/12/20 1651 11/12/20 2007 11/13/20 0745  GLUCAP 108* 110* 161* 137* 147*   Lipid Profile: No results for input(s): CHOL, HDL, LDLCALC, TRIG, CHOLHDL, LDLDIRECT in the last 72 hours. Thyroid Function Tests: No results for input(s): TSH, T4TOTAL, FREET4, T3FREE, THYROIDAB in the last 72 hours. Anemia Panel: No results for input(s): VITAMINB12, FOLATE, FERRITIN, TIBC, IRON, RETICCTPCT in the last 72 hours. Sepsis Labs: Recent Labs  Lab 11/11/20 1330  LATICACIDVEN 0.8    Recent Results (from the past 240 hour(s))  Blood culture (routine single)     Status: None (Preliminary result)   Collection Time: 11/11/20  1:30 PM   Specimen: BLOOD  Result Value Ref Range Status   Specimen Description   Final    BLOOD LEFT ANTECUBITAL Performed at [facility], [address], [place], [place] [address]    Special  Requests   Final    BOTTLES DRAWN AEROBIC AND ANAEROBIC Blood Culture adequate volume Performed at [facility], [address], [place], [place] [address]    Culture   Final    NO GROWTH < 24 HOURS Performed at [facility], [address], [place], [place] [address]    Report Status PENDING  Incomplete  Resp Panel by RT-PCR (Flu A&B, Covid) Nasopharyngeal Swab     Status: None   Collection Time: 11/11/20  2:14 PM   Specimen: Nasopharyngeal Swab; Nasopharyngeal(NP) swabs in vial transport medium  Result Value Ref Range Status   SARS Coronavirus 2 by RT PCR NEGATIVE NEGATIVE Final    Comment: (NOTE) SARS-CoV-2 target nucleic acids are NOT DETECTED.  The SARS-CoV-2 RNA is generally detectable in upper respiratory specimens during the acute phase of infection. The lowest concentration of SARS-CoV-2 viral copies this assay can detect is 138 copies/mL. A negative result does not preclude SARS-Cov-2 infection and should not be used as the sole basis for treatment or other patient management decisions. A negative result may occur with  improper specimen collection/handling, submission of specimen other than nasopharyngeal swab, presence of viral mutation(s) within the areas targeted by this assay, and inadequate number of viral copies(<138 copies/mL). A negative result must be combined with clinical observations, patient history, and epidemiological information. The expected result is Negative.  Fact Sheet for Patients:  BloggerCourse.com  Fact Sheet for Healthcare Providers:  SeriousBroker.it  This test is no t yet approved or cleared by the [place] FDA and  has been authorized for detection and/or diagnosis of SARS-CoV-2 by FDA under an Emergency Use Authorization (EUA). This EUA will remain  in effect (meaning this test can be used) for the duration of the COVID-19 declaration under Section 564(b)(1)  of the Act, 21 U.S.C.section 360bbb-3(b)(1), unless the authorization is terminated  or revoked sooner.       Influenza A by PCR NEGATIVE NEGATIVE Final   Influenza B by PCR NEGATIVE NEGATIVE Final    Comment: (NOTE) The Xpert Xpress SARS-CoV-2/FLU/RSV plus assay is intended  as an aid in the diagnosis of influenza from Nasopharyngeal swab specimens and should not be used as a sole basis for treatment. Nasal washings and aspirates are unacceptable for Xpert Xpress SARS-CoV-2/FLU/RSV testing.  Fact Sheet for Patients: BloggerCourse.com  Fact Sheet for Healthcare Providers: SeriousBroker.it  This test is not yet approved or cleared by the [place] FDA and has been authorized for detection and/or diagnosis of SARS-CoV-2 by FDA under an Emergency Use Authorization (EUA). This EUA will remain in effect (meaning this test can be used) for the duration of the COVID-19 declaration under Section 564(b)(1) of the Act, 21 U.S.C. section 360bbb-3(b)(1), unless the authorization is terminated or revoked.  Performed at [facility], [address], [place], [place] [address]   Urine culture     Status: None (Preliminary result)   Collection Time: 11/11/20  4:00 PM   Specimen: In/Out Cath Urine  Result Value Ref Range Status   Specimen Description   Final    IN/OUT CATH URINE Performed at [facility], [address] [place], [place] [address]    Special Requests   Final    NONE Performed at [facility], [address], [place], [place] [address]    Culture   Final    CULTURE REINCUBATED FOR BETTER GROWTH Performed at [facility], [address], [place], [place] [address]    Report Status PENDING  Incomplete  Gastrointestinal Panel by PCR , Stool     Status: None   Collection Time: 11/12/20  6:41 AM   Specimen: STOOL  Result Value Ref Range Status    Campylobacter species NOT DETECTED NOT DETECTED Final   Plesimonas shigelloides NOT DETECTED NOT DETECTED Final   Salmonella species NOT DETECTED NOT DETECTED Final   Yersinia enterocolitica NOT DETECTED NOT DETECTED Final   Vibrio species NOT DETECTED NOT DETECTED Final   Vibrio cholerae NOT DETECTED NOT DETECTED Final   Enteroaggregative E coli (EAEC) NOT DETECTED NOT DETECTED Final   Enteropathogenic E coli (EPEC) NOT DETECTED NOT DETECTED Final   Enterotoxigenic E coli (ETEC) NOT DETECTED NOT DETECTED Final   Shiga like toxin producing E coli (STEC) NOT DETECTED NOT DETECTED Final   Shigella/Enteroinvasive E coli (EIEC) NOT DETECTED NOT DETECTED Final   Cryptosporidium NOT DETECTED NOT DETECTED Final   Cyclospora cayetanensis NOT DETECTED NOT DETECTED Final   Entamoeba histolytica NOT DETECTED NOT DETECTED Final   Giardia lamblia NOT DETECTED NOT DETECTED Final   Adenovirus F40/41 NOT DETECTED NOT DETECTED Final   Astrovirus NOT DETECTED NOT DETECTED Final   Norovirus GI/GII NOT DETECTED NOT DETECTED Final   Rotavirus A NOT DETECTED NOT DETECTED Final   Sapovirus (I, II, IV, and V) NOT DETECTED NOT DETECTED Final    Comment: Performed at [facility], [address] [place], [place] [address]  C Difficile Quick Screen (NO PCR Reflex)     Status: Abnormal   Collection Time: 11/12/20  6:42 AM   Specimen: STOOL  Result Value Ref Range Status   C Diff antigen POSITIVE (A) NEGATIVE Final   C Diff toxin NEGATIVE NEGATIVE Final   C Diff interpretation   Final    Results are indeterminate. Please contact the provider listed for your campus for C diff questions in AMION.    Comment: Performed at [facility], [address] [place], [place] [address]      Radiology Studies: CT ABDOMEN PELVIS WO CONTRAST  Result Date:  11/11/2020 CLINICAL DATA:  79 year old male with acute abdominal pain. EXAM: CT ABDOMEN AND PELVIS WITHOUT CONTRAST TECHNIQUE:  Multidetector CT imaging of the abdomen and pelvis was performed following the standard protocol without IV contrast. COMPARISON:  CT abdomen pelvis dated 06/29/2020. FINDINGS: Evaluation of this exam is limited in the absence of intravenous contrast. Evaluation is also limited due to severe streak artifact caused by spinal hardware. Lower chest: The visualized lung bases are clear. There is 3 vessel coronary vascular calcification. No intra-abdominal free air or free fluid Hepatobiliary: The liver is unremarkable as visualized. Small stones may be present within the gallbladder. No pericholecystic fluid or evidence of acute cholecystitis by CT. Pancreas: Unremarkable. No pancreatic ductal dilatation or surrounding inflammatory changes. Spleen: Normal in size without focal abnormality. Adrenals/Urinary Tract: The adrenal glands unremarkable. Indeterminate 2 cm exophytic hypodense lesion from the medial upper pole of the right kidney. There is a 5 mm nonobstructing stone or parenchymal calcification in the interpolar left kidney. There is no hydronephrosis or obstructing stone on either side. The visualized ureters and urinary bladder appear unremarkable. Stomach/Bowel: AP several small scattered sigmoid diverticula without active inflammatory changes. There is loose stool throughout the colon compatible with diarrheal state. Correlation with clinical exam and stool cultures recommended. There is no bowel obstruction or active inflammation. No evidence of acute appendicitis. Vascular/Lymphatic: Advanced aortoiliac atherosclerotic disease. The IVC is unremarkable. No portal venous gas. There is no adenopathy. Reproductive: The prostate and seminal vesicles are grossly unremarkable. No pelvic mass. Other: None Musculoskeletal: Osteopenia with degenerative changes of the spine. L1 vertebroplasty. L2-S1 posterior fusion hardware. No acute osseous pathology. IMPRESSION: 1. Diarrheal state. Correlation with clinical exam  and stool cultures recommended. No bowel obstruction. 2. Sigmoid diverticulosis. 3. A 5 mm nonobstructing stone or a focus of parenchymal calcification in the interpolar left kidney. No hydronephrosis or obstructing stone. 4. Aortic Atherosclerosis (ICD10-I70.0). Electronically Signed   By: Elgie Collard M.D.   On: 11/11/2020 15:22   DG Chest Port 1 View  Result Date: 11/11/2020 CLINICAL DATA:  Fever EXAM: PORTABLE CHEST 1 VIEW COMPARISON:  October 29, 2020 chest radiograph and chest CT FINDINGS: There is ill-defined opacity in the left base. Lungs otherwise are clear. Heart size and pulmonary vascularity are normal. No adenopathy. There is arthropathy in the shoulders. IMPRESSION: Ill-defined opacity left base consistent with developing pneumonia. Advise check of COVID-19 status given this appearance. Lungs elsewhere clear. Heart size normal. Electronically Signed   By: Bretta Bang III M.D.   On: 11/11/2020 14:39      Scheduled Meds: . (feeding supplement) PROSource Plus  30 mL Oral BID WC  . cloNIDine  0.1 mg Oral BID  . DULoxetine  30 mg Oral TID  . enoxaparin (LOVENOX) injection  40 mg Subcutaneous Q24H  . feeding supplement  1 Container Oral BID BM  . gabapentin  300 mg Oral TID  . hydroxychloroquine  200 mg Oral BID  . insulin aspart  0-15 Units Subcutaneous TID WC  . insulin aspart  0-5 Units Subcutaneous QHS  . pantoprazole  40 mg Oral Daily  . rosuvastatin  40 mg Oral QPM  . tamsulosin  0.4 mg Oral QPC supper   Continuous Infusions: . azithromycin 500 mg (11/12/20 1716)  . cefTRIAXone (ROCEPHIN)  IV 2 g (11/12/20 1717)     LOS: 2 days      Time spent: 25 minutes   Noralee Stain, DO [facility] 11/13/2020, 10:51 AM   Available via Epic secure  chat 7am-7pm After these hours, please refer to coverage provider listed on [email or web address]

## 2020-11-14 DIAGNOSIS — K529 Noninfective gastroenteritis and colitis, unspecified: Secondary | ICD-10-CM | POA: Diagnosis not present

## 2020-11-14 LAB — GLUCOSE, CAPILLARY
Glucose-Capillary: 158 mg/dL — ABNORMAL HIGH (ref 70–99)
Glucose-Capillary: 268 mg/dL — ABNORMAL HIGH (ref 70–99)
Glucose-Capillary: 247 mg/dL — ABNORMAL HIGH (ref 70–99)
Glucose-Capillary: 146 mg/dL — ABNORMAL HIGH (ref 70–99)

## 2020-11-14 LAB — CBC
HCT: 34.7 % — ABNORMAL LOW (ref 39.0–52.0)
Hemoglobin: 11.8 g/dL — ABNORMAL LOW (ref 13.0–17.0)
MCH: 30.7 pg (ref 26.0–34.0)
MCHC: 34 g/dL (ref 30.0–36.0)
MCV: 90.4 fL (ref 80.0–100.0)
Platelets: 403 10*3/uL — ABNORMAL HIGH (ref 150–400)
RBC: 3.84 MIL/uL — ABNORMAL LOW (ref 4.22–5.81)
RDW: 13.4 % (ref 11.5–15.5)
WBC: 16.7 10*3/uL — ABNORMAL HIGH (ref 4.0–10.5)
nRBC: 0 % (ref 0.0–0.2)

## 2020-11-14 LAB — URINE CULTURE: Culture: >=100000 — AB

## 2020-11-14 LAB — BASIC METABOLIC PANEL
Anion gap: 12 (ref 5–15)
BUN: 19 mg/dL (ref 8–23)
CO2: 22 mmol/L (ref 22–32)
Calcium: 9.5 mg/dL (ref 8.9–10.3)
Chloride: 99 mmol/L (ref 98–111)
Creatinine, Ser: 0.74 mg/dL (ref 0.61–1.24)
GFR, Estimated: >60 mL/min (ref >60)
Glucose, Bld: 168 mg/dL — ABNORMAL HIGH (ref 70–99)
Potassium: 2.9 mmol/L — ABNORMAL LOW (ref 3.5–5.1)
Sodium: 133 mmol/L — ABNORMAL LOW (ref 135–145)

## 2020-11-14 MED ORDER — AMOXICILLIN-POT CLAVULANATE 875-125 MG PO TABS
1.0000 | ORAL_TABLET | Freq: Two times a day (BID) | ORAL | Status: DC
  Administered 2020-11-14 – 2020-11-18 (×9): 1 via ORAL
  Filled 2020-11-14 (×9): qty 1

## 2020-11-14 MED ORDER — ACETAMINOPHEN 325 MG PO TABS
650.0000 mg | ORAL_TABLET | Freq: Four times a day (QID) | ORAL | Status: DC | PRN
  Administered 2020-11-14 – 2020-11-17 (×9): 650 mg via ORAL
  Filled 2020-11-14 (×10): qty 2

## 2020-11-14 MED ORDER — POTASSIUM CHLORIDE CRYS ER 20 MEQ PO TBCR
40.0000 meq | EXTENDED_RELEASE_TABLET | Freq: Two times a day (BID) | ORAL | Status: AC
  Administered 2020-11-14 (×2): 40 meq via ORAL
  Filled 2020-11-14 (×2): qty 2

## 2020-11-14 NOTE — Progress Notes (Signed)
PROGRESS NOTE    Jorge Sherman  ERD:[id number] DOB: [birth year] DOA: 11/11/2020 PCP: Merrilee Seashore, MD     Brief Narrative:  Jorge Sherman is a 79 y.o. male with medical history significant of diet-controlled diabetes mellitus type 2, hypertension, chronic pain, OSA noncompliant with CPAP, hyperlipidemia, BPH, recent admission from 10/29/2020-11/03/2020 with fall and chest wall/flank pain along with leukocytosis, dehydration and hyponatremia treated with IV fluids and subsequent discharge home despite PT/OT recommending SNF presented with multiple episodes of vomiting.  Patient states that he has been having nausea and multiple episodes of nonbloody nonbilious vomiting since last night along with multiple episodes of watery diarrhea and midabdominal pain. He was found to have a temperature of 101 in the EMS.  Patient complains of myalgias.    New events last 24 hours / Subjective: States that he is feeling better today.  He was able to get some sleep.  Migraine has improved.  He denies any further nausea or vomiting.  Tolerating p.o. without any issues.  Rectal tube reveals loose stool.  He is agreeable to SNF placement at this time.  I discussed with him that he has been in the ER/admitted to the hospital 27 times in the past 2 years and our recommendation is for him to go to SNF and then subsequently ALF.  Assessment & Plan:   Active Problems:   Leukocytosis   Obstructive sleep apnea   Chronic pain syndrome   Essential hypertension, benign   Coronary artery disease involving native coronary artery of native heart without angina pectoris   Abdominal pain   Gastroenteritis   Hyponatremia   Nausea, vomiting, diarrhea -Likely secondary to viral gastroenteritis -CT abdomen pelvis showed diarrheal state. Correlation with clinical exam and stool cultures recommended. No bowel obstruction. -C. difficile positive for antigen, negative toxin which indicates bacteria present but not  producing detectable level of toxin. No tx indicated.  -GI PCR negative  -Improved  CAP, left lower lobe -CXR showed Ill-defined opacity left base consistent with developing pneumonia. -Change antibiotics to Augmentin  Enterococcus UTI -Augmentin as above  Hyponatremia -Improved and stable  Hypokalemia -Replace, trend  Hypertension -Continue Catapres  Diabetes mellitus type 2 -Hemoglobin A1c 5.8 -Sliding scale insulin  Chronic pain syndrome -Used to be on Suboxone and oxycodone in the past, not currently on Suboxone  Hyperlipidemia -Continue Crestor  BPH -Continue Flomax  OSA -Noncompliant with CPAP  Migraine -Migraine cocktail ordered every 6 hours as needed  Anxiety -Resume home Ativan as needed   DVT prophylaxis:  enoxaparin (LOVENOX) injection 40 mg Start: 11/12/20 1000  Code Status: DNR Family Communication: No family at bedside; updated daughter 12/24 Disposition Plan:  Status is: Inpatient  Remains inpatient appropriate because:Persistent severe electrolyte disturbances   Dispo: The patient is from: Home              Anticipated d/c is to: SNF              Anticipated d/c date is: 1 day              Patient currently is not medically stable to d/c.  Overall improving, agreeable to SNF at this time.   Consultants:   None  Procedures:   None  Antimicrobials:  Anti-infectives (From admission, onward)   Start     Dose/Rate Route Frequency Ordered Stop   11/14/20 1000  amoxicillin-clavulanate (AUGMENTIN) 875-125 MG per tablet 1 tablet        1 tablet Oral Every 12  hours 11/14/20 0722     11/13/20 1800  azithromycin (ZITHROMAX) tablet 500 mg  Status:  Discontinued        500 mg Oral Daily-1800 11/13/20 1359 11/14/20 0722   11/12/20 1000  hydroxychloroquine (PLAQUENIL) tablet 200 mg        200 mg Oral 2 times daily 11/11/20 1630     11/11/20 1800  cefTRIAXone (ROCEPHIN) 2 g in sodium chloride 0.9 % 100 mL IVPB  Status:  Discontinued         2 g 200 mL/hr over 30 Minutes Intravenous Every 24 hours 11/11/20 1618 11/14/20 0722   11/11/20 1800  azithromycin (ZITHROMAX) 500 mg in sodium chloride 0.9 % 250 mL IVPB  Status:  Discontinued        500 mg 250 mL/hr over 60 Minutes Intravenous Every 24 hours 11/11/20 1618 11/13/20 1359   11/11/20 1645  cefTRIAXone (ROCEPHIN) 2 g in sodium chloride 0.9 % 100 mL IVPB  Status:  Discontinued        2 g 200 mL/hr over 30 Minutes Intravenous Every 24 hours 11/11/20 1630 11/11/20 1705   11/11/20 1645  azithromycin (ZITHROMAX) 500 mg in sodium chloride 0.9 % 250 mL IVPB  Status:  Discontinued        500 mg 250 mL/hr over 60 Minutes Intravenous Every 24 hours 11/11/20 1630 11/11/20 1705   11/11/20 1600  ciprofloxacin (CIPRO) IVPB 400 mg  Status:  Discontinued       "And" Linked Group Details   400 mg 200 mL/hr over 60 Minutes Intravenous  Once 11/11/20 1558 11/11/20 1617   11/11/20 1600  metroNIDAZOLE (FLAGYL) tablet 500 mg  Status:  Discontinued       "And" Linked Group Details   500 mg Oral Every 8 hours 11/11/20 1558 11/11/20 1617       Objective: Vitals:   11/13/20 0443 11/13/20 1422 11/13/20 2118 11/14/20 0508  BP: (!) 168/91 (!) 173/85 (!) 152/79 (!) 179/97  Pulse: 91 (!) 106 100 99  Resp: 20 16 18 18   Temp: 97.9 F (36.6 C) 98.6 F (37 C) 98.1 F (36.7 C) 98.2 F (36.8 C)  TempSrc: Oral Oral Oral Oral  SpO2: 97% 100% 99% 95%  Weight:      Height:        Intake/Output Summary (Last 24 hours) at 11/14/2020 0956 Last data filed at 11/14/2020 0600 Gross per 24 hour  Intake 1410 ml  Output 2650 ml  Net -1240 ml   Filed Weights   11/11/20 1254 11/11/20 1335  Weight: 115.6 kg 106.6 kg    Examination: General exam: Appears calm and comfortable  Respiratory system: Clear to auscultation. Respiratory effort normal. Cardiovascular system: S1 & S2 heard, RRR. No pedal edema. Gastrointestinal system: Abdomen is nondistended, soft and nontender. Normal bowel sounds  heard. Central nervous system: Alert and oriented. Non focal exam. Speech clear  Extremities: Symmetric in appearance bilaterally  Skin: No rashes, lesions or ulcers on exposed skin  Psychiatry: Judgement and insight appear stable. Mood & affect appropriate.   Data Reviewed: I have personally reviewed following labs and imaging studies  CBC: Recent Labs  Lab 11/11/20 1330 11/12/20 0543 11/13/20 0801 11/14/20 0522  WBC 18.3* 17.1* 15.0* 16.7*  NEUTROABS 14.7*  --   --   --   HGB 11.8* 11.4* 11.5* 11.8*  HCT 35.2* 34.7* 35.5* 34.7*  MCV 89.8 93.3 92.2 90.4  PLT 458* 393 414* 403*   Basic Metabolic Panel: Recent Labs  Lab  11/11/20 1330 11/12/20 0543 11/13/20 0801 11/14/20 0522  NA 126* 133* 133* 133*  K 3.4* 4.5 3.7 2.9*  CL 88* 99 102 99  CO2 28 24 21* 22  GLUCOSE 114* 102* 151* 168*  BUN 13 14 14 19   CREATININE 0.74 0.67 0.71 0.74  CALCIUM 9.4 8.8* 9.4 9.5   GFR: Estimated Creatinine Clearance: 93 mL/min (by C-G formula based on SCr of 0.74 mg/dL). Liver Function Tests: Recent Labs  Lab 11/11/20 1330 11/12/20 0543  AST 20 25  ALT 19 14  ALKPHOS 56 52  BILITOT 0.5 0.9  PROT 6.9 6.5  ALBUMIN 4.0 3.6   Recent Labs  Lab 11/11/20 1330  LIPASE 52*   No results for input(s): AMMONIA in the last 168 hours. Coagulation Profile: Recent Labs  Lab 11/11/20 1330  INR 1.0   Cardiac Enzymes: No results for input(s): CKTOTAL, CKMB, CKMBINDEX, TROPONINI in the last 168 hours. BNP (last 3 results) No results for input(s): PROBNP in the last 8760 hours. HbA1C: Recent Labs    11/12/20 0543  HGBA1C 5.8*   CBG: Recent Labs  Lab 11/12/20 2007 11/13/20 0745 11/13/20 1145 11/13/20 2024 11/14/20 0803  GLUCAP 137* 147* 228* 145* 158*   Lipid Profile: No results for input(s): CHOL, HDL, LDLCALC, TRIG, CHOLHDL, LDLDIRECT in the last 72 hours. Thyroid Function Tests: No results for input(s): TSH, T4TOTAL, FREET4, T3FREE, THYROIDAB in the last 72 hours. Anemia  Panel: No results for input(s): VITAMINB12, FOLATE, FERRITIN, TIBC, IRON, RETICCTPCT in the last 72 hours. Sepsis Labs: Recent Labs  Lab 11/11/20 1330  LATICACIDVEN 0.8    Recent Results (from the past 240 hour(s))  Blood culture (routine single)     Status: None (Preliminary result)   Collection Time: 11/11/20  1:30 PM   Specimen: BLOOD  Result Value Ref Range Status   Specimen Description   Final    BLOOD LEFT ANTECUBITAL Performed at [facility] [address], [place], [place] [address]    Special Requests   Final    BOTTLES DRAWN AEROBIC AND ANAEROBIC Blood Culture adequate volume Performed at [facility] [address] [place], [place] [address]    Culture   Final    NO GROWTH 2 DAYS Performed at [facility] [address] [place], [place] [address]    Report Status PENDING  Incomplete  Resp Panel by RT-PCR (Flu A&B, Covid) Nasopharyngeal Swab     Status: None   Collection Time: 11/11/20  2:14 PM   Specimen: Nasopharyngeal Swab; Nasopharyngeal(NP) swabs in vial transport medium  Result Value Ref Range Status   SARS Coronavirus 2 by RT PCR NEGATIVE NEGATIVE Final    Comment: (NOTE) SARS-CoV-2 target nucleic acids are NOT DETECTED.  The SARS-CoV-2 RNA is generally detectable in upper respiratory specimens during the acute phase of infection. The lowest concentration of SARS-CoV-2 viral copies this assay can detect is 138 copies/mL. A negative result does not preclude SARS-Cov-2 infection and should not be used as the sole basis for treatment or other patient management decisions. A negative result may occur with  improper specimen collection/handling, submission of specimen other than nasopharyngeal swab, presence of viral mutation(s) within the areas targeted by this assay, and inadequate number of viral copies(<138 copies/mL). A negative result must be combined with clinical observations, patient history, and  epidemiological information. The expected result is Negative.  Fact Sheet for Patients:  EntrepreneurPulse.com.au  Fact Sheet for Healthcare Providers:  IncredibleEmployment.be  This test  is no t yet approved or cleared by the [place] and  has been authorized for detection and/or diagnosis of SARS-CoV-2 by FDA under an Emergency Use Authorization (EUA). This EUA will remain  in effect (meaning this test can be used) for the duration of the COVID-19 declaration under Section 564(b)(1) of the Act, 21 U.S.C.section 360bbb-3(b)(1), unless the authorization is terminated  or revoked sooner.       Influenza A by PCR NEGATIVE NEGATIVE Final   Influenza B by PCR NEGATIVE NEGATIVE Final    Comment: (NOTE) The Xpert Xpress SARS-CoV-2/FLU/RSV plus assay is intended as an aid in the diagnosis of influenza from Nasopharyngeal swab specimens and should not be used as a sole basis for treatment. Nasal washings and aspirates are unacceptable for Xpert Xpress SARS-CoV-2/FLU/RSV testing.  Fact Sheet for Patients: BloggerCourse.com  Fact Sheet for Healthcare Providers: SeriousBroker.it  This test is not yet approved or cleared by the [place] FDA and has been authorized for detection and/or diagnosis of SARS-CoV-2 by FDA under an Emergency Use Authorization (EUA). This EUA will remain in effect (meaning this test can be used) for the duration of the COVID-19 declaration under Section 564(b)(1) of the Act, 21 U.S.C. section 360bbb-3(b)(1), unless the authorization is terminated or revoked.  Performed at [facility], [address] [place], [place] [address]   Urine culture     Status: Abnormal   Collection Time: 11/11/20  4:00 PM   Specimen: In/Out Cath Urine  Result Value Ref Range Status   Specimen Description   Final    IN/OUT CATH URINE Performed at [facility], [address], [place], [place] [address]    Special Requests   Final    NONE Performed at [facility], [address] [place], [place] [address]    Culture >=100,000 COLONIES/mL ENTEROCOCCUS FAECALIS (A)  Final   Report Status 11/14/2020 FINAL  Final   Organism ID, Bacteria ENTEROCOCCUS FAECALIS (A)  Final      Susceptibility   Enterococcus faecalis - MIC*    AMPICILLIN <=2 SENSITIVE Sensitive     NITROFURANTOIN <=16 SENSITIVE Sensitive     VANCOMYCIN 1 SENSITIVE Sensitive     * >=100,000 COLONIES/mL ENTEROCOCCUS FAECALIS  Gastrointestinal Panel by PCR , Stool     Status: None   Collection Time: 11/12/20  6:41 AM   Specimen: STOOL  Result Value Ref Range Status   Campylobacter species NOT DETECTED NOT DETECTED Final   Plesimonas shigelloides NOT DETECTED NOT DETECTED Final   Salmonella species NOT DETECTED NOT DETECTED Final   Yersinia enterocolitica NOT DETECTED NOT DETECTED Final   Vibrio species NOT DETECTED NOT DETECTED Final   Vibrio cholerae NOT DETECTED NOT DETECTED Final   Enteroaggregative E coli (EAEC) NOT DETECTED NOT DETECTED Final   Enteropathogenic E coli (EPEC) NOT DETECTED NOT DETECTED Final   Enterotoxigenic E coli (ETEC) NOT DETECTED NOT DETECTED Final   Shiga like toxin producing E coli (STEC) NOT DETECTED NOT DETECTED Final   Shigella/Enteroinvasive E coli (EIEC) NOT DETECTED NOT DETECTED Final   Cryptosporidium NOT DETECTED NOT DETECTED Final   Cyclospora cayetanensis NOT DETECTED NOT DETECTED Final   Entamoeba histolytica NOT DETECTED NOT DETECTED Final   Giardia lamblia NOT DETECTED NOT DETECTED Final   Adenovirus F40/41 NOT DETECTED NOT DETECTED Final   Astrovirus NOT DETECTED NOT DETECTED Final   Norovirus GI/GII NOT DETECTED NOT DETECTED Final   Rotavirus A NOT DETECTED NOT DETECTED  Final   Sapovirus (I, II, IV, and V) NOT DETECTED NOT DETECTED Final    Comment: Performed at [facility], [address] [place], [place] [address]  C Difficile Quick Screen (NO PCR Reflex)     Status: Abnormal   Collection Time: 11/12/20  6:42 AM   Specimen: STOOL  Result Value Ref Range Status   C Diff antigen POSITIVE (A) NEGATIVE Final   C Diff toxin NEGATIVE NEGATIVE Final   C Diff interpretation   Final    Results are indeterminate. Please contact the provider listed for your campus for C diff questions in AMION.    Comment: Performed at [facility], [address] [place], [place] [address]      Radiology Studies: No results found.    Scheduled Meds: . (feeding supplement) PROSource Plus  30 mL Oral BID WC  . amoxicillin-clavulanate  1 tablet Oral Q12H  . cloNIDine  0.1 mg Oral BID  . DULoxetine  30 mg Oral TID  . enoxaparin (LOVENOX) injection  40 mg Subcutaneous Q24H  . feeding supplement  1 Container Oral BID BM  . gabapentin  300 mg Oral TID  . hydroxychloroquine  200 mg Oral BID  . insulin aspart  0-15 Units Subcutaneous TID WC  . insulin aspart  0-5 Units Subcutaneous QHS  . pantoprazole  40 mg Oral Daily  . potassium chloride  40 mEq Oral BID  . rosuvastatin  40 mg Oral QPM  . tamsulosin  0.4 mg Oral QPC supper   Continuous Infusions: . methocarbamol (ROBAXIN) IV 500 mg (11/14/20 0656)     LOS: 3 days      Time spent: 25 minutes   Noralee Stain, DO [facility] 11/14/2020, 9:56 AM   Available via Epic secure chat 7am-7pm After these hours, please refer to coverage provider listed on [email or web address]

## 2020-11-14 NOTE — Progress Notes (Signed)
Patient is complaining of a headache 10/10, Jorge Sherman was paged.

## 2020-11-15 DIAGNOSIS — K529 Noninfective gastroenteritis and colitis, unspecified: Secondary | ICD-10-CM | POA: Diagnosis not present

## 2020-11-15 LAB — CBC
HCT: 36.9 % — ABNORMAL LOW (ref 39.0–52.0)
Hemoglobin: 12.2 g/dL — ABNORMAL LOW (ref 13.0–17.0)
MCH: 30.4 pg (ref 26.0–34.0)
MCHC: 33.1 g/dL (ref 30.0–36.0)
MCV: 92 fL (ref 80.0–100.0)
Platelets: 417 10*3/uL — ABNORMAL HIGH (ref 150–400)
RBC: 4.01 MIL/uL — ABNORMAL LOW (ref 4.22–5.81)
RDW: 13.5 % (ref 11.5–15.5)
WBC: 15.7 10*3/uL — ABNORMAL HIGH (ref 4.0–10.5)
nRBC: 0 % (ref 0.0–0.2)

## 2020-11-15 LAB — BASIC METABOLIC PANEL
Anion gap: 11 (ref 5–15)
BUN: 28 mg/dL — ABNORMAL HIGH (ref 8–23)
CO2: 21 mmol/L — ABNORMAL LOW (ref 22–32)
Calcium: 9.7 mg/dL (ref 8.9–10.3)
Chloride: 102 mmol/L (ref 98–111)
Creatinine, Ser: 0.81 mg/dL (ref 0.61–1.24)
GFR, Estimated: >60 mL/min (ref >60)
Glucose, Bld: 156 mg/dL — ABNORMAL HIGH (ref 70–99)
Potassium: 3.3 mmol/L — ABNORMAL LOW (ref 3.5–5.1)
Sodium: 134 mmol/L — ABNORMAL LOW (ref 135–145)

## 2020-11-15 LAB — GLUCOSE, CAPILLARY
Glucose-Capillary: 157 mg/dL — ABNORMAL HIGH (ref 70–99)
Glucose-Capillary: 158 mg/dL — ABNORMAL HIGH (ref 70–99)
Glucose-Capillary: 243 mg/dL — ABNORMAL HIGH (ref 70–99)
Glucose-Capillary: 213 mg/dL — ABNORMAL HIGH (ref 70–99)

## 2020-11-15 MED ORDER — POTASSIUM CHLORIDE CRYS ER 20 MEQ PO TBCR
40.0000 meq | EXTENDED_RELEASE_TABLET | ORAL | Status: AC
Start: 2020-11-15 — End: 2020-11-15
  Administered 2020-11-15 (×2): 40 meq via ORAL
  Filled 2020-11-15 (×2): qty 2

## 2020-11-15 MED ORDER — AMLODIPINE BESYLATE 10 MG PO TABS
10.0000 mg | ORAL_TABLET | Freq: Every day | ORAL | Status: DC
  Administered 2020-11-15 – 2020-11-28 (×14): 10 mg via ORAL
  Filled 2020-11-15 (×14): qty 1

## 2020-11-15 NOTE — Progress Notes (Signed)
PROGRESS NOTE    Jorge Sherman  YYQ:[id number] DOB: [birth year] DOA: 11/11/2020 PCP: Georgianne Fick, MD     Brief Narrative:  AMAIR SHROUT is a 79 y.o. male with medical history significant of diet-controlled diabetes mellitus type 2, hypertension, chronic pain, OSA noncompliant with CPAP, hyperlipidemia, BPH, recent admission from 10/29/2020-11/03/2020 with fall and chest wall/flank pain along with leukocytosis, dehydration and hyponatremia treated with IV fluids and subsequent discharge home despite PT/OT recommending SNF presented with multiple episodes of vomiting.  Patient states that he has been having nausea and multiple episodes of nonbloody nonbilious vomiting since last night along with multiple episodes of watery diarrhea and midabdominal pain. He was found to have a temperature of 101 in the EMS.  Patient complains of myalgias.    New events last 24 hours / Subjective: No nausea or vomiting.  Wants his rectal tube removed.  Had a mild headache last night but improved after Tylenol.  He denies any abdominal pain.  Assessment & Plan:   Active Problems:   Leukocytosis   Obstructive sleep apnea   Chronic pain syndrome   Essential hypertension, benign   Coronary artery disease involving native coronary artery of native heart without angina pectoris   Abdominal pain   Gastroenteritis   Hyponatremia   Nausea, vomiting, diarrhea -Likely secondary to viral gastroenteritis -CT abdomen pelvis showed diarrheal state. Correlation with clinical exam and stool cultures recommended. No bowel obstruction. -C. difficile positive for antigen, negative toxin which indicates bacteria present but not producing detectable level of toxin. No tx indicated.  -GI PCR negative  -Improved. Hopefully can removed rectal tube in next 24 hours   CAP, left lower lobe -CXR showed Ill-defined opacity left base consistent with developing pneumonia. -Change antibiotics to Augmentin  Enterococcus  UTI -Augmentin as above  Hyponatremia -Improved and stable  Hypokalemia -Replace, trend  Hypertension -Continue Catapres, add norvasc    Diabetes mellitus type 2 -Hemoglobin A1c 5.8 -Sliding scale insulin  Chronic pain syndrome -Used to be on Suboxone and oxycodone in the past, not currently on Suboxone  Hyperlipidemia -Continue Crestor  BPH -Continue Flomax  OSA -Noncompliant with CPAP  Migraine -Migraine cocktail ordered every 6 hours as needed  Anxiety -Resume home Ativan as needed   DVT prophylaxis:  enoxaparin (LOVENOX) injection 40 mg Start: 11/12/20 1000  Code Status: DNR Family Communication: No family at bedside Disposition Plan:  Status is: Inpatient  Remains inpatient appropriate because:Unsafe d/c plan   Dispo: The patient is from: Home              Anticipated d/c is to: SNF              Anticipated d/c date is: 1 day              Patient currently is not medically stable to d/c.  Overall improving, agreeable to SNF at this time.    Consultants:   None  Procedures:   None  Antimicrobials:  Anti-infectives (From admission, onward)   Start     Dose/Rate Route Frequency Ordered Stop   11/14/20 1000  amoxicillin-clavulanate (AUGMENTIN) 875-125 MG per tablet 1 tablet        1 tablet Oral Every 12 hours 11/14/20 0722     11/13/20 1800  azithromycin (ZITHROMAX) tablet 500 mg  Status:  Discontinued        500 mg Oral Daily-1800 11/13/20 1359 11/14/20 0722   11/12/20 1000  hydroxychloroquine (PLAQUENIL) tablet 200 mg  200 mg Oral 2 times daily 11/11/20 1630     11/11/20 1800  cefTRIAXone (ROCEPHIN) 2 g in sodium chloride 0.9 % 100 mL IVPB  Status:  Discontinued        2 g 200 mL/hr over 30 Minutes Intravenous Every 24 hours 11/11/20 1618 11/14/20 0722   11/11/20 1800  azithromycin (ZITHROMAX) 500 mg in sodium chloride 0.9 % 250 mL IVPB  Status:  Discontinued        500 mg 250 mL/hr over 60 Minutes Intravenous Every 24 hours 11/11/20  1618 11/13/20 1359   11/11/20 1645  cefTRIAXone (ROCEPHIN) 2 g in sodium chloride 0.9 % 100 mL IVPB  Status:  Discontinued        2 g 200 mL/hr over 30 Minutes Intravenous Every 24 hours 11/11/20 1630 11/11/20 1705   11/11/20 1645  azithromycin (ZITHROMAX) 500 mg in sodium chloride 0.9 % 250 mL IVPB  Status:  Discontinued        500 mg 250 mL/hr over 60 Minutes Intravenous Every 24 hours 11/11/20 1630 11/11/20 1705   11/11/20 1600  ciprofloxacin (CIPRO) IVPB 400 mg  Status:  Discontinued       "And" Linked Group Details   400 mg 200 mL/hr over 60 Minutes Intravenous  Once 11/11/20 1558 11/11/20 1617   11/11/20 1600  metroNIDAZOLE (FLAGYL) tablet 500 mg  Status:  Discontinued       "And" Linked Group Details   500 mg Oral Every 8 hours 11/11/20 1558 11/11/20 1617       Objective: Vitals:   11/14/20 2148 11/14/20 2230 11/15/20 0439 11/15/20 0618  BP: (!) 191/104 (!) 188/100 (!) 186/99 (!) 175/84  Pulse: 96 85 91   Resp:   17   Temp: 98.8 F (37.1 C)  97.9 F (36.6 C)   TempSrc: Oral Oral    SpO2:  97% 99%   Weight:      Height:        Intake/Output Summary (Last 24 hours) at 11/15/2020 1051 Last data filed at 11/15/2020 0600 Gross per 24 hour  Intake 1630 ml  Output 1400 ml  Net 230 ml   Filed Weights   11/11/20 1254 11/11/20 1335  Weight: 115.6 kg 106.6 kg    Examination: General exam: Appears calm and comfortable  Respiratory system: Clear to auscultation. Respiratory effort normal. Cardiovascular system: S1 & S2 heard, RRR. No pedal edema. Gastrointestinal system: Abdomen is nondistended, soft and nontender. Normal bowel sounds heard. Central nervous system: Alert and oriented. Non focal exam. Speech clear  Extremities: Symmetric in appearance bilaterally  Skin: No rashes, lesions or ulcers on exposed skin  Psychiatry: Judgement and insight appear stable. Mood & affect appropriate.    Data Reviewed: I have personally reviewed following labs and imaging  studies  CBC: Recent Labs  Lab 11/11/20 1330 11/12/20 0543 11/13/20 0801 11/14/20 0522 11/15/20 0432  WBC 18.3* 17.1* 15.0* 16.7* 15.7*  NEUTROABS 14.7*  --   --   --   --   HGB 11.8* 11.4* 11.5* 11.8* 12.2*  HCT 35.2* 34.7* 35.5* 34.7* 36.9*  MCV 89.8 93.3 92.2 90.4 92.0  PLT 458* 393 414* 403* 423*   Basic Metabolic Panel: Recent Labs  Lab 11/11/20 1330 11/12/20 0543 11/13/20 0801 11/14/20 0522 11/15/20 0432  NA 126* 133* 133* 133* 134*  K 3.4* 4.5 3.7 2.9* 3.3*  CL 88* 99 102 99 102  CO2 28 24 21* 22 21*  GLUCOSE 114* 102* 151* 168* 156*  BUN  13 14 14 19  28*  CREATININE 0.74 0.67 0.71 0.74 0.81  CALCIUM 9.4 8.8* 9.4 9.5 9.7   GFR: Estimated Creatinine Clearance: 91.8 mL/min (by C-G formula based on SCr of 0.81 mg/dL). Liver Function Tests: Recent Labs  Lab 11/11/20 1330 11/12/20 0543  AST 20 25  ALT 19 14  ALKPHOS 56 52  BILITOT 0.5 0.9  PROT 6.9 6.5  ALBUMIN 4.0 3.6   Recent Labs  Lab 11/11/20 1330  LIPASE 52*   No results for input(s): AMMONIA in the last 168 hours. Coagulation Profile: Recent Labs  Lab 11/11/20 1330  INR 1.0   Cardiac Enzymes: No results for input(s): CKTOTAL, CKMB, CKMBINDEX, TROPONINI in the last 168 hours. BNP (last 3 results) No results for input(s): PROBNP in the last 8760 hours. HbA1C: No results for input(s): HGBA1C in the last 72 hours. CBG: Recent Labs  Lab 11/14/20 0803 11/14/20 1207 11/14/20 1653 11/14/20 2100 11/15/20 0748  GLUCAP 158* 268* 247* 146* 157*   Lipid Profile: No results for input(s): CHOL, HDL, LDLCALC, TRIG, CHOLHDL, LDLDIRECT in the last 72 hours. Thyroid Function Tests: No results for input(s): TSH, T4TOTAL, FREET4, T3FREE, THYROIDAB in the last 72 hours. Anemia Panel: No results for input(s): VITAMINB12, FOLATE, FERRITIN, TIBC, IRON, RETICCTPCT in the last 72 hours. Sepsis Labs: Recent Labs  Lab 11/11/20 1330  LATICACIDVEN 0.8    Recent Results (from the past 240 hour(s))   Blood culture (routine single)     Status: None (Preliminary result)   Collection Time: 11/11/20  1:30 PM   Specimen: BLOOD  Result Value Ref Range Status   Specimen Description   Final    BLOOD LEFT ANTECUBITAL Performed at [facility], [address] [place], [place]    Special Requests   Final    BOTTLES DRAWN AEROBIC AND ANAEROBIC Blood Culture adequate volume Performed at [facility], [address], [place], [place]    Culture   Final    NO GROWTH 3 DAYS Performed at [facility], [address] [place], [place]    Report Status PENDING  Incomplete  Resp Panel by RT-PCR (Flu A&B, Covid) Nasopharyngeal Swab     Status: None   Collection Time: 11/11/20  2:14 PM   Specimen: Nasopharyngeal Swab; Nasopharyngeal(NP) swabs in vial transport medium  Result Value Ref Range Status   SARS Coronavirus 2 by RT PCR NEGATIVE NEGATIVE Final    Comment: (NOTE) SARS-CoV-2 target nucleic acids are NOT DETECTED.  The SARS-CoV-2 RNA is generally detectable in upper respiratory specimens during the acute phase of infection. The lowest concentration of SARS-CoV-2 viral copies this assay can detect is 138 copies/mL. A negative result does not preclude SARS-Cov-2 infection and should not be used as the sole basis for treatment or other patient management decisions. A negative result may occur with  improper specimen collection/handling, submission of specimen other than nasopharyngeal swab, presence of viral mutation(s) within the areas targeted by this assay, and inadequate number of viral copies(<138 copies/mL). A negative result must be combined with clinical observations, patient history, and epidemiological information. The expected result is Negative.  Fact Sheet for Patients:  11/13/20  Fact Sheet for Healthcare Providers:  BloggerCourse.com  This  test is no t yet approved or cleared by the [employer] FDA and  has been authorized for detection and/or diagnosis of SARS-CoV-2 by FDA under an Emergency Use Authorization (EUA). This EUA will remain  in effect (meaning  this test can be used) for the duration of the COVID-19 declaration under Section 564(b)(1) of the Act, 21 U.S.C.section 360bbb-3(b)(1), unless the authorization is terminated  or revoked sooner.       Influenza A by PCR NEGATIVE NEGATIVE Final   Influenza B by PCR NEGATIVE NEGATIVE Final    Comment: (NOTE) The Xpert Xpress SARS-CoV-2/FLU/RSV plus assay is intended as an aid in the diagnosis of influenza from Nasopharyngeal swab specimens and should not be used as a sole basis for treatment. Nasal washings and aspirates are unacceptable for Xpert Xpress SARS-CoV-2/FLU/RSV testing.  Fact Sheet for Patients: EntrepreneurPulse.com.au  Fact Sheet for Healthcare Providers: IncredibleEmployment.be  This test is not yet approved or cleared by the [place] FDA and has been authorized for detection and/or diagnosis of SARS-CoV-2 by FDA under an Emergency Use Authorization (EUA). This EUA will remain in effect (meaning this test can be used) for the duration of the COVID-19 declaration under Section 564(b)(1) of the Act, 21 U.S.C. section 360bbb-3(b)(1), unless the authorization is terminated or revoked.  Performed at [facility], [place] [address] [place], [place] [address]   Urine culture     Status: Abnormal   Collection Time: 11/11/20  4:00 PM   Specimen: In/Out Cath Urine  Result Value Ref Range Status   Specimen Description   Final    IN/OUT CATH URINE Performed at [facility] [address], [place], [place] [address]    Special Requests   Final    NONE Performed at [facility], [place] [address], [place], [place] [address]    Culture >=100,000 COLONIES/mL  ENTEROCOCCUS FAECALIS (A)  Final   Report Status 11/14/2020 FINAL  Final   Organism ID, Bacteria ENTEROCOCCUS FAECALIS (A)  Final      Susceptibility   Enterococcus faecalis - MIC*    AMPICILLIN <=2 SENSITIVE Sensitive     NITROFURANTOIN <=16 SENSITIVE Sensitive     VANCOMYCIN 1 SENSITIVE Sensitive     * >=100,000 COLONIES/mL ENTEROCOCCUS FAECALIS  Gastrointestinal Panel by PCR , Stool     Status: None   Collection Time: 11/12/20  6:41 AM   Specimen: STOOL  Result Value Ref Range Status   Campylobacter species NOT DETECTED NOT DETECTED Final   Plesimonas shigelloides NOT DETECTED NOT DETECTED Final   Salmonella species NOT DETECTED NOT DETECTED Final   Yersinia enterocolitica NOT DETECTED NOT DETECTED Final   Vibrio species NOT DETECTED NOT DETECTED Final   Vibrio cholerae NOT DETECTED NOT DETECTED Final   Enteroaggregative E coli (EAEC) NOT DETECTED NOT DETECTED Final   Enteropathogenic E coli (EPEC) NOT DETECTED NOT DETECTED Final   Enterotoxigenic E coli (ETEC) NOT DETECTED NOT DETECTED Final   Shiga like toxin producing E coli (STEC) NOT DETECTED NOT DETECTED Final   Shigella/Enteroinvasive E coli (EIEC) NOT DETECTED NOT DETECTED Final   Cryptosporidium NOT DETECTED NOT DETECTED Final   Cyclospora cayetanensis NOT DETECTED NOT DETECTED Final   Entamoeba histolytica NOT DETECTED NOT DETECTED Final   Giardia lamblia NOT DETECTED NOT DETECTED Final   Adenovirus F40/41 NOT DETECTED NOT DETECTED Final   Astrovirus NOT DETECTED NOT DETECTED Final   Norovirus GI/GII NOT DETECTED NOT DETECTED Final   Rotavirus A NOT DETECTED NOT DETECTED Final   Sapovirus (I, II, IV, and V) NOT DETECTED NOT DETECTED Final    Comment: Performed at [facility], [address] [place], [place] [address]  C Difficile Quick Screen (NO PCR Reflex)  Status: Abnormal   Collection Time: 11/12/20  6:42 AM   Specimen: STOOL  Result Value Ref Range Status   C Diff antigen POSITIVE (A) NEGATIVE  Final   C Diff toxin NEGATIVE NEGATIVE Final   C Diff interpretation   Final    Results are indeterminate. Please contact the provider listed for your campus for C diff questions in AMION.    Comment: Performed at [facility], [address] [place], [place] [address]      Radiology Studies: No results found.    Scheduled Meds:  (feeding supplement) PROSource Plus  30 mL Oral BID WC   amLODipine  10 mg Oral Daily   amoxicillin-clavulanate  1 tablet Oral Q12H   cloNIDine  0.1 mg Oral BID   DULoxetine  30 mg Oral TID   enoxaparin (LOVENOX) injection  40 mg Subcutaneous Q24H   feeding supplement  1 Container Oral BID BM   gabapentin  300 mg Oral TID   hydroxychloroquine  200 mg Oral BID   insulin aspart  0-15 Units Subcutaneous TID WC   insulin aspart  0-5 Units Subcutaneous QHS   pantoprazole  40 mg Oral Daily   potassium chloride  40 mEq Oral Q4H   rosuvastatin  40 mg Oral QPM   tamsulosin  0.4 mg Oral QPC supper   Continuous Infusions:  methocarbamol (ROBAXIN) IV 500 mg (11/15/20 0526)     LOS: 4 days      Time spent: 25 minutes   Noralee Stain, DO [facility] 11/15/2020, 10:51 AM   Available via Epic secure chat 7am-7pm After these hours, please refer to coverage provider listed on [email or web address]

## 2020-11-16 DIAGNOSIS — K529 Noninfective gastroenteritis and colitis, unspecified: Secondary | ICD-10-CM | POA: Diagnosis not present

## 2020-11-16 LAB — CBC
HCT: 35.7 % — ABNORMAL LOW (ref 39.0–52.0)
Hemoglobin: 11.8 g/dL — ABNORMAL LOW (ref 13.0–17.0)
MCH: 29.9 pg (ref 26.0–34.0)
MCHC: 33.1 g/dL (ref 30.0–36.0)
MCV: 90.6 fL (ref 80.0–100.0)
Platelets: 374 10*3/uL (ref 150–400)
RBC: 3.94 MIL/uL — ABNORMAL LOW (ref 4.22–5.81)
RDW: 13.5 % (ref 11.5–15.5)
WBC: 10.9 10*3/uL — ABNORMAL HIGH (ref 4.0–10.5)
nRBC: 0 % (ref 0.0–0.2)

## 2020-11-16 LAB — BASIC METABOLIC PANEL
Anion gap: 10 (ref 5–15)
BUN: 36 mg/dL — ABNORMAL HIGH (ref 8–23)
CO2: 20 mmol/L — ABNORMAL LOW (ref 22–32)
Calcium: 9.8 mg/dL (ref 8.9–10.3)
Chloride: 103 mmol/L (ref 98–111)
Creatinine, Ser: 0.83 mg/dL (ref 0.61–1.24)
GFR, Estimated: >60 mL/min (ref >60)
Glucose, Bld: 182 mg/dL — ABNORMAL HIGH (ref 70–99)
Potassium: 3.7 mmol/L (ref 3.5–5.1)
Sodium: 133 mmol/L — ABNORMAL LOW (ref 135–145)

## 2020-11-16 LAB — GLUCOSE, CAPILLARY
Glucose-Capillary: 222 mg/dL — ABNORMAL HIGH (ref 70–99)
Glucose-Capillary: 190 mg/dL — ABNORMAL HIGH (ref 70–99)
Glucose-Capillary: 150 mg/dL — ABNORMAL HIGH (ref 70–99)
Glucose-Capillary: 163 mg/dL — ABNORMAL HIGH (ref 70–99)
Glucose-Capillary: 184 mg/dL — ABNORMAL HIGH (ref 70–99)

## 2020-11-16 LAB — MAGNESIUM: Magnesium: 2.1 mg/dL (ref 1.7–2.4)

## 2020-11-16 LAB — CULTURE, BLOOD (SINGLE)
Culture: NO GROWTH
Special Requests: ADEQUATE

## 2020-11-16 MED ORDER — TRAMADOL HCL 50 MG PO TABS
50.0000 mg | ORAL_TABLET | Freq: Four times a day (QID) | ORAL | Status: DC | PRN
  Administered 2020-11-16 – 2020-11-28 (×21): 50 mg via ORAL
  Filled 2020-11-16 (×24): qty 1

## 2020-11-16 MED ORDER — LIP MEDEX EX OINT
TOPICAL_OINTMENT | CUTANEOUS | Status: AC
  Administered 2020-11-16: 1
  Filled 2020-11-16: qty 7

## 2020-11-16 MED ORDER — OXYCODONE-ACETAMINOPHEN 5-325 MG PO TABS
1.0000 | ORAL_TABLET | Freq: Four times a day (QID) | ORAL | Status: DC | PRN
  Administered 2020-11-16 – 2020-11-19 (×7): 1 via ORAL
  Filled 2020-11-16 (×7): qty 1

## 2020-11-16 MED ORDER — METHOCARBAMOL 500 MG PO TABS
500.0000 mg | ORAL_TABLET | Freq: Four times a day (QID) | ORAL | Status: DC | PRN
  Administered 2020-11-16 – 2020-11-28 (×31): 500 mg via ORAL
  Filled 2020-11-16 (×32): qty 1

## 2020-11-16 NOTE — TOC Initial Note (Signed)
Transition of Care [facility]) - Initial/Assessment Note    Patient Details  Name: Jorge Sherman MRN: [id number] Date of Birth: 04-Jan-1941  Transition of [facility]) CM/SW Contact:    Servando Snare, LCSW Phone Number: 11/16/2020, 10:31 AM  Clinical Narrative:  Patient from home alone. Patient has aide that comes in 3x a week. Patient dc from [facility] 2 weeks ago with [facility]me health. Patient initially declined SNF. Per attending patient is nor agreeable and daughter prefers patient goes to SNF. Patient recently went to SNF in Aug.   Plan: SNF at dc  Possible barriers: insurance auth due to multiple SNF stays and number of SNF days remaining. Patient may be required to pay an out of pocket copay.       Expected Discharge Plan: Skilled Nursing Facility Barriers to Discharge: No SNF bed,Insurance Authorization   Patient Goals and CMS Choice Patient states their goals for this hospitalization and ongoing recovery are:: go home CMS Medicare.gov Compare Post Acute Care list provided to:: Patient Choice offered to / list presented to : Patient  Expected Discharge Plan and Services Expected Discharge Plan: [place] In-house Referral: NA Discharge Planning Services: NA Post Acute Care Choice: [place] Living arrangements for the past 2 months: Apartment                 DME Arranged: N/A DME Agency: NA       HH Arranged: NA [place] Agency: NA        Prior Living Arrangements/Services Living arrangements for the past 2 months: Apartment Lives with:: Self Patient language and need for interpreter reviewed:: Yes        Need for Family Participation in Patient Care: Yes (Comment) Care giver support system in place?: Yes (comment) Current home services: Homehealth aide,DME Criminal Activity/Legal Involvement Pertinent to Current Situation/Hospitalization: No - Comment as needed  Activities of Daily Living Home Assistive Devices/Equipment: Electric  scooter,Eyeglasses ADL Screening (condition at time of admission) Patient's cognitive ability adequate to safely complete daily activities?: No Is the patient deaf or have difficulty hearing?: No Does the patient have difficulty seeing, even when wearing glasses/contacts?: No Does the patient have difficulty concentrating, remembering, or making decisions?: No Patient able to express need for assistance with ADLs?: Yes Does the patient have difficulty dressing or bathing?: Yes Independently performs ADLs?: No Communication: Independent Dressing (OT): Needs assistance Is this a change from baseline?: Pre-admission baseline Grooming: Independent Feeding: Independent Bathing: Needs assistance Is this a change from baseline?: Pre-admission baseline Toileting: Needs assistance Is this a change from baseline?: Pre-admission baseline In/Out Bed: Needs assistance Is this a change from baseline?: Pre-admission baseline Walks in Home: Needs assistance Is this a change from baseline?: Pre-admission baseline Does the patient have difficulty walking or climbing stairs?: Yes Weakness of Legs: Both Weakness of Arms/Hands: Both  Permission Sought/Granted      Share Information with NAME: [name]     Permission granted to share info w Relationship: daughter  Permission granted to share info w Contact Information: [phone]  Emotional Assessment Appearance:: Appears stated age Attitude/Demeanor/Rapport: Engaged Affect (typically observed): Accepting Orientation: : Oriented to Self,Oriented to Place,Oriented to  Time,Oriented to Situation Alcohol / Substance Use: Never Used Psych Involvement: No (comment)  Admission diagnosis:  Hyponatremia [E87.1] Elevated lipase [R74.8] Intractable abdominal pain [R10.9] Abdominal pain [R10.9] Diarrhea, unspecified type [R19.7] Patient Active Problem List   Diagnosis Date Noted  . Abdominal pain 11/11/2020  . Gastroenteritis 11/11/2020  .  Hyponatremia 11/11/2020  .  Falls 10/30/2020  . Dehydration with hyponatremia 10/29/2020  . Fall at home, initial encounter 10/29/2020  . Musculoskeletal chest pain 10/29/2020  . Mixed hyperlipidemia due to type 2 diabetes mellitus ([place]) 10/29/2020  . Benign prostatic hyperplasia without lower urinary tract symptoms 10/29/2020  . Ileus ([place])   . Declining functional status   . Goals of care, counseling/discussion   . Palliative care by specialist   . AKI (acute kidney injury) ([place]) 06/27/2020  . Acute diarrhea 06/27/2020  . Altered mental status 08/28/2019  . Septic joint of left knee joint ([place]) 04/22/2019  . Erectile dysfunction 10/30/2013  . Essential hypertension, benign 10/30/2013  . Coronary artery disease involving native coronary artery of native heart without angina pectoris 10/30/2013  . Dizziness and giddiness   . Pain in joint, lower leg   . Leukocytosis   . Type 2 diabetes mellitus without complication, with long-term current use of insulin ([place])   . Obstructive sleep apnea   . Depressive disorder, not elsewhere classified   . Essential and other specified forms of tremor   . Headache(784.0)   . Spinal stenosis, lumbar region, without neurogenic claudication   . Thoracic or lumbosacral neuritis or radiculitis, unspecified   . Chronic pain syndrome    PCP:  Merrilee Seashore, MD Pharmacy:   [facility], [place] [address] Phone: [phone] Fax: [phone]     Social Determinants of Health (SDOH) Interventions    Readmission Risk Interventions Readmission Risk Prevention Plan 06/29/2020  Transportation Screening Complete  Home Care Screening Complete  Medication Review (RN CM) Complete  Some recent data might be hidden

## 2020-11-16 NOTE — Progress Notes (Signed)
Physical Therapy Treatment Patient Details Name: Jorge Sherman MRN: [id number] DOB: 1941/11/06 Today's Date: 11/16/2020    History of Present Illness Jorge Sherman is a 79 y.o. male with medical history significant of diet-controlled diabetes mellitus type 2, hypertension, chronic pain, OSA noncompliant with CPAP, hyperlipidemia, BPH, recent admission from 10/29/2020-11/03/2020 with fall and chest wall/flank pain along with leukocytosis, dehydration and hyponatremia treated with IV fluids and subsequent discharge home despite PT/OT recommending SNF presented with multiple episodes of vomiting.  Patient states that he has been having nausea and multiple episodes of nonbloody nonbilious vomiting since last night along with multiple episodes of watery diarrhea and midabdominal pain. He was found to have a temperature of 101 in the EMS.  Patient complains of myalgias    PT Comments    General Comments: AxO x 3 slightly "odd" humor but pleasant lives home alone and uses a scooter for mobility (in home).  Assisted OOB was difficult.  General bed mobility comments: required increased assist for upper body and assist to complete scooting to EOB.  Decreased functional use B  UE's "bad shoulders" General transfer comment: required increased assist to rise from elevated surface present with severe posterior lean.  VERY unsteady turning to recliner.  Second sit to stand to perform peri care leakage around rectal tube.  Poor standing balance. General Gait Details: only a few steps frpm bed to recliner with posterior lean and unsteady.  HIGH FALL RISK.  NOT performing at prior level of mobility. NOT safe to D/C back home without 24/7 assist.     Follow Up Recommendations  SNF;Supervision/Assistance - 24 hour;Home health PT     Equipment Recommendations  None recommended by PT    Recommendations for Other Services       Precautions / Restrictions Precautions Precautions: Fall Precaution Comments: uses  a scooter for mobility Restrictions Weight Bearing Restrictions: No    Mobility  Bed Mobility Overal bed mobility: Needs Assistance Bed Mobility: Rolling;Supine to Sit;Sit to Sidelying Rolling: Min assist   Supine to sit: Mod assist     General bed mobility comments: required increased assist for upper body and assist to complete scooting to EOB.  Decreased functional use B  UE's "bad shoulders"  Transfers Overall transfer level: Needs assistance Equipment used: Rolling walker (2 wheeled) Transfers: Sit to/from [employer] Sit to Stand: Min assist;From elevated surface Stand pivot transfers: Mod assist       General transfer comment: required increased assist to rise from elevated surface present with severe posterior lean.  VERY unsteady turning to recliner.  Second sit to stand to perform peri care leakage around rectal tube.  Poor standing balance.  Ambulation/Gait             General Gait Details: only a few steps frpm bed to recliner with posterior lean and unsteady   Stairs             Wheelchair Mobility    Modified Rankin (Stroke Patients Only)       Balance                                            Cognition Arousal/Alertness: Awake/alert Behavior During Therapy: WFL for tasks assessed/performed Overall Cognitive Status: Within Functional Limits for tasks assessed  General Comments: AxO x 3 slightly "odd" humor but pleasant      Exercises      General Comments        Pertinent Vitals/Pain Pain Assessment: Faces Faces Pain Scale: Hurts even more Pain Location: B shoulders and LEFT sciatic Pain Descriptors / Indicators: Discomfort;Grimacing;Aching Pain Intervention(s): Monitored during session;Repositioned    Home Living                      Prior Function            PT Goals (current goals can now be found in the care plan section)  Progress towards PT goals: Progressing toward goals    Frequency    Min 3X/week      PT Plan Current plan remains appropriate    Co-evaluation              AM-PAC PT "6 Clicks" Mobility   Outcome Measure  Help needed turning from your back to your side while in a flat bed without using bedrails?: A Lot Help needed moving from lying on your back to sitting on the side of a flat bed without using bedrails?: A Lot Help needed moving to and from a bed to a chair (including a wheelchair)?: A Lot Help needed standing up from a chair using your arms (e.g., wheelchair or bedside chair)?: A Lot Help needed to walk in hospital room?: Total Help needed climbing 3-5 steps with a railing? : Total 6 Click Score: 10    End of Session Equipment Utilized During Treatment: Gait belt Activity Tolerance: Patient limited by fatigue Patient left: in chair;with call bell/phone within reach Nurse Communication: Mobility status PT Visit Diagnosis: Muscle weakness (generalized) (M62.81);Other abnormalities of gait and mobility (R26.89);Pain Pain - part of body: Shoulder     Time: 1000-1018 PT Time Calculation (min) (ACUTE ONLY): 18 min  Charges:  $Therapeutic Activity: 8-22 mins                     Rica Koyanagi  PTA Acute  Rehabilitation Services Pager      [phone] Office      [phone]

## 2020-11-16 NOTE — NC FL2 (Signed)
[place] LEVEL OF CARE SCREENING TOOL     IDENTIFICATION  Patient Name: Jorge Sherman Birthdate: [birth year] Sex: male Admission Date (Current Location): 11/11/2020  [facility] and [place] Number:  Herbalist and Address:  [facility],  [place], [place]      Provider Number: [id number]  Attending Physician Name and Address:  Dessa Phi, DO  Relative Name and Phone Number:       Current Level of Care: Hospital Recommended Level of Care: [place] Prior Approval Number:    Date Approved/Denied:   PASRR Number: [id number]  Discharge Plan: SNF    Current Diagnoses: Patient Active Problem List   Diagnosis Date Noted  . Abdominal pain 11/11/2020  . Gastroenteritis 11/11/2020  . Hyponatremia 11/11/2020  . Falls 10/30/2020  . Dehydration with hyponatremia 10/29/2020  . Fall at home, initial encounter 10/29/2020  . Musculoskeletal chest pain 10/29/2020  . Mixed hyperlipidemia due to type 2 diabetes mellitus ([place]) 10/29/2020  . Benign prostatic hyperplasia without lower urinary tract symptoms 10/29/2020  . Ileus ([place])   . Declining functional status   . Goals of care, counseling/discussion   . Palliative care by specialist   . AKI (acute kidney injury) ([place]) 06/27/2020  . Acute diarrhea 06/27/2020  . Altered mental status 08/28/2019  . Septic joint of left knee joint ([place]) 04/22/2019  . Erectile dysfunction 10/30/2013  . Essential hypertension, benign 10/30/2013  . Coronary artery disease involving native coronary artery of native heart without angina pectoris 10/30/2013  . Dizziness and giddiness   . Pain in joint, lower leg   . Leukocytosis   . Type 2 diabetes mellitus without complication, with long-term current use of insulin ([place])   . Obstructive sleep apnea   . Depressive disorder, not elsewhere classified   . Essential and other specified forms of tremor   . Headache(784.0)   . Spinal  stenosis, lumbar region, without neurogenic claudication   . Thoracic or lumbosacral neuritis or radiculitis, unspecified   . Chronic pain syndrome     Orientation RESPIRATION BLADDER Height & Weight     Self,Time,Situation,Place  Normal Continent Weight: 236 lb 1.8 oz (107.1 kg) Height:  5\' 11"  (180.3 cm)  BEHAVIORAL SYMPTOMS/MOOD NEUROLOGICAL BOWEL NUTRITION STATUS      Incontinent Diet (See dc summary)  AMBULATORY STATUS COMMUNICATION OF NEEDS Skin   Extensive Assist Verbally Normal                       Personal Care Assistance Level of Assistance  Bathing,Feeding,Dressing Bathing Assistance: Limited assistance Feeding assistance: Independent Dressing Assistance: Limited assistance     Functional Limitations Info  Sight,Hearing,Speech Sight Info: Adequate Hearing Info: Adequate Speech Info: Adequate    SPECIAL CARE FACTORS FREQUENCY  PT (By licensed PT),OT (By licensed OT)     PT Frequency: 5x/week OT Frequency: 5x/week            Contractures Contractures Info: Not present    Additional Factors Info  Code Status,Allergies Code Status Info: DNR             Current Medications (11/16/2020):  This is the current hospital active medication list Current Facility-Administered Medications  Medication Dose Route Frequency Provider Last Rate Last Admin  . (feeding supplement) PROSource Plus liquid 30 mL  30 mL Oral BID WC Dessa Phi, DO   30 mL at 11/16/20 0918  . acetaminophen (TYLENOL) tablet 650 mg  650 mg  Oral Q6H PRN Manuela Schwartz, NP   650 mg at 11/16/20 0528  . albuterol (PROVENTIL) (2.5 MG/3ML) 0.083% nebulizer solution 2.5 mg  2.5 mg Nebulization Q2H PRN Glade Jarrah, MD   2.5 mg at 11/15/20 1832  . amLODipine (NORVASC) tablet 10 mg  10 mg Oral Daily Noralee Stain, DO   10 mg at 11/16/20 0920  . amoxicillin-clavulanate (AUGMENTIN) 875-125 MG per tablet 1 tablet  1 tablet Oral Q12H Noralee Stain, DO   1 tablet at 11/16/20 0920  .  cloNIDine (CATAPRES) tablet 0.1 mg  0.1 mg Oral BID Glade Quandre, MD   0.1 mg at 11/16/20 0920  . prochlorperazine (COMPAZINE) injection 10 mg  10 mg Intravenous Q6H PRN Noralee Stain, DO   10 mg at 11/16/20 0519   And  . diphenhydrAMINE (BENADRYL) injection 25 mg  25 mg Intravenous Q6H PRN Noralee Stain, DO   25 mg at 11/16/20 0520   And  . ketorolac (TORADOL) 30 MG/ML injection 30 mg  30 mg Intravenous Q6H PRN Noralee Stain, DO   30 mg at 11/16/20 0519   And  . dexamethasone (DECADRON) injection 10 mg  10 mg Intravenous Q6H PRN Noralee Stain, DO   10 mg at 11/16/20 0519  . diclofenac Sodium (VOLTAREN) 1 % topical gel 2 g  2 g Topical QID PRN Hanley Ben, Kshitiz, MD      . DULoxetine (CYMBALTA) DR capsule 30 mg  30 mg Oral TID Glade Kavan, MD   30 mg at 11/16/20 0920  . enoxaparin (LOVENOX) injection 40 mg  40 mg Subcutaneous Q24H Noralee Stain, DO   40 mg at 11/16/20 4098  . feeding supplement (BOOST / RESOURCE BREEZE) liquid 1 Container  1 Container Oral BID BM Noralee Stain, DO   1 Container at 11/16/20 [phone]  . gabapentin (NEURONTIN) capsule 300 mg  300 mg Oral TID Noralee Stain, DO   300 mg at 11/16/20 0920  . hydroxychloroquine (PLAQUENIL) tablet 200 mg  200 mg Oral BID Glade Irene, MD   200 mg at 11/16/20 0920  . insulin aspart (novoLOG) injection 0-15 Units  0-15 Units Subcutaneous TID WC Glade Kollin, MD   3 Units at 11/16/20 0919  . insulin aspart (novoLOG) injection 0-5 Units  0-5 Units Subcutaneous QHS Glade Lavoy, MD   2 Units at 11/15/20 2207  . lidocaine (LIDODERM) 5 % 1 patch  1 patch Transdermal Daily PRN Glade Ankit, MD   1 patch at 11/12/20 0513  . LORazepam (ATIVAN) tablet 1 mg  1 mg Oral Q6H PRN Noralee Stain, DO   1 mg at 11/15/20 2030   Or  . LORazepam (ATIVAN) injection 1 mg  1 mg Intramuscular Q6H PRN Noralee Stain, DO      . methocarbamol (ROBAXIN) 500 mg in dextrose 5 % 50 mL IVPB  500 mg Intravenous Q6H PRN Noralee Stain, DO 100 mL/hr at 11/15/20  2025 500 mg at 11/15/20 2025  . metoprolol tartrate (LOPRESSOR) injection 5 mg  5 mg Intravenous Q6H PRN Glade Jaxiel, MD   5 mg at 11/15/20 1319  . ondansetron (ZOFRAN) tablet 4 mg  4 mg Oral Q6H PRN Glade Rivers, MD       Or  . ondansetron (ZOFRAN) injection 4 mg  4 mg Intravenous Q6H PRN Glade Makaveli, MD   4 mg at 11/12/20 0945  . oxyCODONE-acetaminophen (PERCOCET/ROXICET) 5-325 MG per tablet 1 tablet  1 tablet Oral Q6H PRN Noralee Stain, DO      . pantoprazole (PROTONIX)  EC tablet 40 mg  40 mg Oral Daily Aline August, MD   40 mg at 11/16/20 0921  . rosuvastatin (CRESTOR) tablet 40 mg  40 mg Oral QPM Starla Link, Kshitiz, MD   40 mg at 11/15/20 1829  . tamsulosin (FLOMAX) capsule 0.4 mg  0.4 mg Oral QPC supper Aline August, MD   0.4 mg at 11/15/20 1829     Discharge Medications: Please see discharge summary for a list of discharge medications.  Relevant Imaging Results:  Relevant Lab Results:   Additional Information SS#  [id number]  Servando Snare, LCSW

## 2020-11-16 NOTE — Progress Notes (Signed)
PROGRESS NOTE    Jorge Sherman  [id number] DOB: 03-Mar-1941 DOA: 11/11/2020 PCP: Merrilee Seashore, MD     Brief Narrative:  Jorge Sherman is a 79 y.o. male with medical history significant of diet-controlled diabetes mellitus type 2, hypertension, chronic pain, OSA noncompliant with CPAP, hyperlipidemia, BPH, recent admission from 10/29/2020-11/03/2020 with fall and chest wall/flank pain along with leukocytosis, dehydration and hyponatremia treated with IV fluids and subsequent discharge home despite PT/OT recommending SNF presented with multiple episodes of vomiting.  Patient states that he has been having nausea and multiple episodes of nonbloody nonbilious vomiting since last night along with multiple episodes of watery diarrhea and midabdominal pain. He was found to have a temperature of 101 in the EMS.  Patient complains of myalgias.    New events last 24 hours / Subjective: Diarrhea has improved with decreased output from the rectal tube.  Complains of chronic shoulder and neck pain. States that he is afraid to eat too much and cause constipation   Assessment & Plan:   Active Problems:   Leukocytosis   Obstructive sleep apnea   Chronic pain syndrome   Essential hypertension, benign   Coronary artery disease involving native coronary artery of native heart without angina pectoris   Abdominal pain   Gastroenteritis   Hyponatremia   Nausea, vomiting, diarrhea -Likely secondary to viral gastroenteritis -CT abdomen pelvis showed diarrheal state. Correlation with clinical exam and stool cultures recommended. No bowel obstruction. -C. difficile positive for antigen, negative toxin which indicates bacteria present but not producing detectable level of toxin. No tx indicated.  -GI PCR negative  -Improved. Remove rectal tube today   CAP, left lower lobe -CXR showed Ill-defined opacity left base consistent with developing pneumonia. -Augmentin -WBC continues to  improve  Enterococcus UTI -Augmentin as above  Hyponatremia -Remains stable  Hypertension -Continue Catapres, norvasc    Diabetes mellitus type 2 -Hemoglobin A1c 5.8 -Sliding scale insulin  Chronic pain syndrome -Used to be on Suboxone and oxycodone in the past, not currently on Suboxone  Hyperlipidemia -Continue Crestor  BPH -Continue Flomax  OSA -Noncompliant with CPAP  Migraine -Migraine cocktail ordered every 6 hours as needed  Anxiety -Resume home Ativan as needed   DVT prophylaxis:  enoxaparin (LOVENOX) injection 40 mg Start: 11/12/20 1000  Code Status: DNR Family Communication: No family at bedside Disposition Plan:  Status is: Inpatient  Remains inpatient appropriate because:Unsafe d/c plan   Dispo: The patient is from: Home              Anticipated d/c is to: SNF              Anticipated d/c date is: 1 day              Patient currently is medically stable to d/c.  Awaiting SNF placement   Consultants:   None  Procedures:   None  Antimicrobials:  Anti-infectives (From admission, onward)   Start     Dose/Rate Route Frequency Ordered Stop   11/14/20 1000  amoxicillin-clavulanate (AUGMENTIN) 875-125 MG per tablet 1 tablet        1 tablet Oral Every 12 hours 11/14/20 0722     11/13/20 1800  azithromycin (ZITHROMAX) tablet 500 mg  Status:  Discontinued        500 mg Oral Daily-1800 11/13/20 1359 11/14/20 0722   11/12/20 1000  hydroxychloroquine (PLAQUENIL) tablet 200 mg        200 mg Oral 2 times daily 11/11/20 1630  11/11/20 1800  cefTRIAXone (ROCEPHIN) 2 g in sodium chloride 0.9 % 100 mL IVPB  Status:  Discontinued        2 g 200 mL/hr over 30 Minutes Intravenous Every 24 hours 11/11/20 1618 11/14/20 0722   11/11/20 1800  azithromycin (ZITHROMAX) 500 mg in sodium chloride 0.9 % 250 mL IVPB  Status:  Discontinued        500 mg 250 mL/hr over 60 Minutes Intravenous Every 24 hours 11/11/20 1618 11/13/20 1359   11/11/20 1645  cefTRIAXone  (ROCEPHIN) 2 g in sodium chloride 0.9 % 100 mL IVPB  Status:  Discontinued        2 g 200 mL/hr over 30 Minutes Intravenous Every 24 hours 11/11/20 1630 11/11/20 1705   11/11/20 1645  azithromycin (ZITHROMAX) 500 mg in sodium chloride 0.9 % 250 mL IVPB  Status:  Discontinued        500 mg 250 mL/hr over 60 Minutes Intravenous Every 24 hours 11/11/20 1630 11/11/20 1705   11/11/20 1600  ciprofloxacin (CIPRO) IVPB 400 mg  Status:  Discontinued       "And" Linked Group Details   400 mg 200 mL/hr over 60 Minutes Intravenous  Once 11/11/20 1558 11/11/20 1617   11/11/20 1600  metroNIDAZOLE (FLAGYL) tablet 500 mg  Status:  Discontinued       "And" Linked Group Details   500 mg Oral Every 8 hours 11/11/20 1558 11/11/20 1617       Objective: Vitals:   11/15/20 0618 11/15/20 1251 11/15/20 2044 11/16/20 0502  BP: (!) 175/84 (!) 169/101 (!) 168/95 (!) 165/93  Pulse:  93 92 91  Resp:  18 (!) 22 (!) 22  Temp:  98.2 F (36.8 C) 98.1 F (36.7 C) 98.1 F (36.7 C)  TempSrc:  Oral Oral Oral  SpO2:  99% 97% 100%  Weight:    107.1 kg  Height:        Intake/Output Summary (Last 24 hours) at 11/16/2020 0942 Last data filed at 11/16/2020 0600 Gross per 24 hour  Intake 350 ml  Output 1850 ml  Net -1500 ml   Filed Weights   11/11/20 1254 11/11/20 1335 11/16/20 0502  Weight: 115.6 kg 106.6 kg 107.1 kg    Examination: General exam: Appears calm and comfortable  Respiratory system: Clear to auscultation. Respiratory effort normal. Cardiovascular system: S1 & S2 heard, RRR. No pedal edema. Gastrointestinal system: Abdomen is nondistended, soft and nontender. Normal bowel sounds heard. Central nervous system: Alert and oriented. Non focal exam. Speech clear  Extremities: Symmetric in appearance bilaterally  Skin: No rashes, lesions or ulcers on exposed skin  Psychiatry: Judgement and insight appear stable. Mood & affect appropriate.    Data Reviewed: I have personally reviewed following labs  and imaging studies  CBC: Recent Labs  Lab 11/11/20 1330 11/12/20 0543 11/13/20 0801 11/14/20 0522 11/15/20 0432 11/16/20 0415  WBC 18.3* 17.1* 15.0* 16.7* 15.7* 10.9*  NEUTROABS 14.7*  --   --   --   --   --   HGB 11.8* 11.4* 11.5* 11.8* 12.2* 11.8*  HCT 35.2* 34.7* 35.5* 34.7* 36.9* 35.7*  MCV 89.8 93.3 92.2 90.4 92.0 90.6  PLT 458* 393 414* 403* 417* [id number]   Basic Metabolic Panel: Recent Labs  Lab 11/12/20 0543 11/13/20 0801 11/14/20 0522 11/15/20 0432 11/16/20 0415  NA 133* 133* 133* 134* 133*  K 4.5 3.7 2.9* 3.3* 3.7  CL 99 102 99 102 103  CO2 24 21* 22 21* 20*  GLUCOSE 102* 151* 168* 156* 182*  BUN 14 14 19  28* 36*  CREATININE 0.67 0.71 0.74 0.81 0.83  CALCIUM 8.8* 9.4 9.5 9.7 9.8  MG  --   --   --   --  2.1   GFR: Estimated Creatinine Clearance: 89.8 mL/min (by C-G formula based on SCr of 0.83 mg/dL). Liver Function Tests: Recent Labs  Lab 11/11/20 1330 11/12/20 0543  AST 20 25  ALT 19 14  ALKPHOS 56 52  BILITOT 0.5 0.9  PROT 6.9 6.5  ALBUMIN 4.0 3.6   Recent Labs  Lab 11/11/20 1330  LIPASE 52*   No results for input(s): AMMONIA in the last 168 hours. Coagulation Profile: Recent Labs  Lab 11/11/20 1330  INR 1.0   Cardiac Enzymes: No results for input(s): CKTOTAL, CKMB, CKMBINDEX, TROPONINI in the last 168 hours. BNP (last 3 results) No results for input(s): PROBNP in the last 8760 hours. HbA1C: No results for input(s): HGBA1C in the last 72 hours. CBG: Recent Labs  Lab 11/15/20 0748 11/15/20 1249 11/15/20 1723 11/15/20 2046 11/16/20 0734  GLUCAP 157* 158* 243* 213* 190*   Lipid Profile: No results for input(s): CHOL, HDL, LDLCALC, TRIG, CHOLHDL, LDLDIRECT in the last 72 hours. Thyroid Function Tests: No results for input(s): TSH, T4TOTAL, FREET4, T3FREE, THYROIDAB in the last 72 hours. Anemia Panel: No results for input(s): VITAMINB12, FOLATE, FERRITIN, TIBC, IRON, RETICCTPCT in the last 72 hours. Sepsis Labs: Recent Labs  Lab  11/11/20 1330  LATICACIDVEN 0.8    Recent Results (from the past 240 hour(s))  Blood culture (routine single)     Status: None   Collection Time: 11/11/20  1:30 PM   Specimen: BLOOD  Result Value Ref Range Status   Specimen Description   Final    BLOOD LEFT ANTECUBITAL Performed at [facility] [address] [place], [place] [address]    Special Requests   Final    BOTTLES DRAWN AEROBIC AND ANAEROBIC Blood Culture adequate volume Performed at [facility] [address] [place], [place] [address]    Culture   Final    NO GROWTH 5 DAYS Performed at [facility], [place] [address], [place], [place] [address]    Report Status 11/16/2020 FINAL  Final  Resp Panel by RT-PCR (Flu A&B, Covid) Nasopharyngeal Swab     Status: None   Collection Time: 11/11/20  2:14 PM   Specimen: Nasopharyngeal Swab; Nasopharyngeal(NP) swabs in vial transport medium  Result Value Ref Range Status   SARS Coronavirus 2 by RT PCR NEGATIVE NEGATIVE Final    Comment: (NOTE) SARS-CoV-2 target nucleic acids are NOT DETECTED.  The SARS-CoV-2 RNA is generally detectable in upper respiratory specimens during the acute phase of infection. The lowest concentration of SARS-CoV-2 viral copies this assay can detect is 138 copies/mL. A negative result does not preclude SARS-Cov-2 infection and should not be used as the sole basis for treatment or other patient management decisions. A negative result may occur with  improper specimen collection/handling, submission of specimen other than nasopharyngeal swab, presence of viral mutation(s) within the areas targeted by this assay, and inadequate number of viral copies(<138 copies/mL). A negative result must be combined with clinical observations, patient history, and epidemiological information. The expected result is Negative.  Fact Sheet for Patients:  EntrepreneurPulse.com.au  Fact Sheet for Healthcare  Providers:  IncredibleEmployment.be  This test is no t yet approved or cleared by the [place] and  has been authorized  for detection and/or diagnosis of SARS-CoV-2 by FDA under an Emergency Use Authorization (EUA). This EUA will remain  in effect (meaning this test can be used) for the duration of the COVID-19 declaration under Section 564(b)(1) of the Act, 21 U.S.C.section 360bbb-3(b)(1), unless the authorization is terminated  or revoked sooner.       Influenza A by PCR NEGATIVE NEGATIVE Final   Influenza B by PCR NEGATIVE NEGATIVE Final    Comment: (NOTE) The Xpert Xpress SARS-CoV-2/FLU/RSV plus assay is intended as an aid in the diagnosis of influenza from Nasopharyngeal swab specimens and should not be used as a sole basis for treatment. Nasal washings and aspirates are unacceptable for Xpert Xpress SARS-CoV-2/FLU/RSV testing.  Fact Sheet for Patients: EntrepreneurPulse.com.au  Fact Sheet for Healthcare Providers: IncredibleEmployment.be  This test is not yet approved or cleared by the [place] FDA and has been authorized for detection and/or diagnosis of SARS-CoV-2 by FDA under an Emergency Use Authorization (EUA). This EUA will remain in effect (meaning this test can be used) for the duration of the COVID-19 declaration under Section 564(b)(1) of the Act, 21 U.S.C. section 360bbb-3(b)(1), unless the authorization is terminated or revoked.  Performed at [facility], [place] [address] [place], [place] [address]   Urine culture     Status: Abnormal   Collection Time: 11/11/20  4:00 PM   Specimen: In/Out Cath Urine  Result Value Ref Range Status   Specimen Description   Final    IN/OUT CATH URINE Performed at [facility] [address] [place], [place] [address]    Special Requests   Final    NONE Performed at [facility], [place] [address], [place], [place] [address]    Culture >=100,000 COLONIES/mL ENTEROCOCCUS FAECALIS (A)  Final   Report Status 11/14/2020 FINAL  Final   Organism ID, Bacteria ENTEROCOCCUS FAECALIS (A)  Final      Susceptibility   Enterococcus faecalis - MIC*    AMPICILLIN <=2 SENSITIVE Sensitive     NITROFURANTOIN <=16 SENSITIVE Sensitive     VANCOMYCIN 1 SENSITIVE Sensitive     * >=100,000 COLONIES/mL ENTEROCOCCUS FAECALIS  Gastrointestinal Panel by PCR , Stool     Status: None   Collection Time: 11/12/20  6:41 AM   Specimen: STOOL  Result Value Ref Range Status   Campylobacter species NOT DETECTED NOT DETECTED Final   Plesimonas shigelloides NOT DETECTED NOT DETECTED Final   Salmonella species NOT DETECTED NOT DETECTED Final   Yersinia enterocolitica NOT DETECTED NOT DETECTED Final   Vibrio species NOT DETECTED NOT DETECTED Final   Vibrio cholerae NOT DETECTED NOT DETECTED Final   Enteroaggregative E coli (EAEC) NOT DETECTED NOT DETECTED Final   Enteropathogenic E coli (EPEC) NOT DETECTED NOT DETECTED Final   Enterotoxigenic E coli (ETEC) NOT DETECTED NOT DETECTED Final   Shiga like toxin producing E coli (STEC) NOT DETECTED NOT DETECTED Final   Shigella/Enteroinvasive E coli (EIEC) NOT DETECTED NOT DETECTED Final   Cryptosporidium NOT DETECTED NOT DETECTED Final   Cyclospora cayetanensis NOT DETECTED NOT DETECTED Final   Entamoeba histolytica NOT DETECTED NOT DETECTED Final   Giardia lamblia NOT DETECTED NOT DETECTED Final   Adenovirus F40/41 NOT DETECTED NOT DETECTED Final   Astrovirus NOT DETECTED NOT DETECTED Final   Norovirus GI/GII NOT DETECTED NOT DETECTED Final   Rotavirus A NOT DETECTED NOT DETECTED Final   Sapovirus (I, II, IV, and V) NOT DETECTED NOT DETECTED Final  Comment: Performed at [facility], [address] [place], [place] [address]  C Difficile Quick Screen (NO PCR Reflex)     Status: Abnormal   Collection Time: 11/12/20  6:42 AM   Specimen: STOOL  Result  Value Ref Range Status   C Diff antigen POSITIVE (A) NEGATIVE Final   C Diff toxin NEGATIVE NEGATIVE Final   C Diff interpretation   Final    Results are indeterminate. Please contact the provider listed for your campus for C diff questions in AMION.    Comment: Performed at [facility], [address] [place], [place] [address]      Radiology Studies: No results found.    Scheduled Meds: . (feeding supplement) PROSource Plus  30 mL Oral BID WC  . amLODipine  10 mg Oral Daily  . amoxicillin-clavulanate  1 tablet Oral Q12H  . cloNIDine  0.1 mg Oral BID  . DULoxetine  30 mg Oral TID  . enoxaparin (LOVENOX) injection  40 mg Subcutaneous Q24H  . feeding supplement  1 Container Oral BID BM  . gabapentin  300 mg Oral TID  . hydroxychloroquine  200 mg Oral BID  . insulin aspart  0-15 Units Subcutaneous TID WC  . insulin aspart  0-5 Units Subcutaneous QHS  . pantoprazole  40 mg Oral Daily  . rosuvastatin  40 mg Oral QPM  . tamsulosin  0.4 mg Oral QPC supper   Continuous Infusions: . methocarbamol (ROBAXIN) IV 500 mg (11/15/20 2025)     LOS: 5 days      Time spent: 25 minutes   Noralee Stain, DO [facility] 11/16/2020, 9:42 AM   Available via Epic secure chat 7am-7pm After these hours, please refer to coverage provider listed on [email or web address]

## 2020-11-17 DIAGNOSIS — K529 Noninfective gastroenteritis and colitis, unspecified: Secondary | ICD-10-CM | POA: Diagnosis not present

## 2020-11-17 LAB — BASIC METABOLIC PANEL
Anion gap: 12 (ref 5–15)
BUN: 38 mg/dL — ABNORMAL HIGH (ref 8–23)
CO2: 21 mmol/L — ABNORMAL LOW (ref 22–32)
Calcium: 9.8 mg/dL (ref 8.9–10.3)
Chloride: 102 mmol/L (ref 98–111)
Creatinine, Ser: 0.81 mg/dL (ref 0.61–1.24)
GFR, Estimated: >60 mL/min (ref >60)
Glucose, Bld: 178 mg/dL — ABNORMAL HIGH (ref 70–99)
Potassium: 3.8 mmol/L (ref 3.5–5.1)
Sodium: 135 mmol/L (ref 135–145)

## 2020-11-17 LAB — CBC
HCT: 38.4 % — ABNORMAL LOW (ref 39.0–52.0)
Hemoglobin: 12.9 g/dL — ABNORMAL LOW (ref 13.0–17.0)
MCH: 30.7 pg (ref 26.0–34.0)
MCHC: 33.6 g/dL (ref 30.0–36.0)
MCV: 91.4 fL (ref 80.0–100.0)
Platelets: 362 10*3/uL (ref 150–400)
RBC: 4.2 MIL/uL — ABNORMAL LOW (ref 4.22–5.81)
RDW: 13.4 % (ref 11.5–15.5)
WBC: 12.9 10*3/uL — ABNORMAL HIGH (ref 4.0–10.5)
nRBC: 0 % (ref 0.0–0.2)

## 2020-11-17 LAB — GLUCOSE, CAPILLARY
Glucose-Capillary: 170 mg/dL — ABNORMAL HIGH (ref 70–99)
Glucose-Capillary: 178 mg/dL — ABNORMAL HIGH (ref 70–99)
Glucose-Capillary: 172 mg/dL — ABNORMAL HIGH (ref 70–99)
Glucose-Capillary: 152 mg/dL — ABNORMAL HIGH (ref 70–99)

## 2020-11-17 LAB — SARS CORONAVIRUS 2 (TAT 6-24 HRS): SARS Coronavirus 2: NEGATIVE

## 2020-11-17 MED ORDER — ACETAMINOPHEN 500 MG PO TABS
1000.0000 mg | ORAL_TABLET | Freq: Four times a day (QID) | ORAL | Status: DC | PRN
  Administered 2020-11-17 – 2020-11-27 (×25): 1000 mg via ORAL
  Filled 2020-11-17 (×28): qty 2

## 2020-11-17 MED ORDER — LOPERAMIDE HCL 2 MG PO CAPS: 2.0000 mg | ORAL_CAPSULE | ORAL | Status: DC | PRN

## 2020-11-17 NOTE — TOC Progression Note (Signed)
Transition of Care [facility]) - Progression Note    Patient Details  Name: KELTON BULTMAN MRN: [id number] Date of Birth: 07/15/1941  [facility]) CM/SW Contact  Clearance Coots, LCSW Phone Number: 11/17/2020, 1:35 PM  Clinical Narrative:    CSW provided the patient SNF bed offers. Patient requested csw call his adult children to also provide SNF options. CSW reached out to the patient adult children, left voicemail.  [employer] Medicare pending auth.#  [id number]. Clinical faxed for review.   Expected Discharge Plan: Skilled Nursing Facility Barriers to Discharge: Insurance Authorization,Other (comment) (covid test)  Expected Discharge Plan and Services Expected Discharge Plan: Skilled Nursing Facility In-house Referral: NA Discharge Planning Services: NA Post Acute Care Choice: Skilled Nursing Facility Living arrangements for the past 2 months: Apartment                 DME Arranged: N/A DME Agency: NA       HH Arranged: NA HH Agency: NA         Social Determinants of Health (SDOH) Interventions    Readmission Risk Interventions Readmission Risk Prevention Plan 11/17/2020 06/29/2020  Transportation Screening Complete Complete  Home Care Screening - Complete  Medication Review (RN CM) - Complete  Medication Review [facility]) Complete -  PCP or Specialist appointment within 3-5 days of discharge Complete -  SW Recovery Care/Counseling Consult Complete -  Skilled Nursing Facility Complete -  Some recent data might be hidden

## 2020-11-17 NOTE — Progress Notes (Signed)
PROGRESS NOTE    Jorge Sherman  [id number] DOB: [birth year] DOA: 11/11/2020 PCP: Merrilee Seashore, MD     Brief Narrative:  Jorge Sherman is a 79 y.o. male with medical history significant of diet-controlled diabetes mellitus type 2, hypertension, chronic pain, OSA noncompliant with CPAP, hyperlipidemia, BPH, recent admission from 10/29/2020-11/03/2020 with fall and chest wall/flank pain along with leukocytosis, dehydration and hyponatremia treated with IV fluids and subsequent discharge home despite PT/OT recommending SNF presented with multiple episodes of vomiting.  Patient states that he has been having nausea and multiple episodes of nonbloody nonbilious vomiting since last night along with multiple episodes of watery diarrhea and midabdominal pain. He was found to have a temperature of 101 in the EMS.  Patient complains of myalgias.    New events last 24 hours / Subjective: Very upset that no one helped him with breakfast this morning. Complains of sciatica on left, states that he needs a higher dose of tylenol.   Assessment & Plan:   Active Problems:   Leukocytosis   Obstructive sleep apnea   Chronic pain syndrome   Essential hypertension, benign   Coronary artery disease involving native coronary artery of native heart without angina pectoris   Abdominal pain   Gastroenteritis   Hyponatremia   Nausea, vomiting, diarrhea -Likely secondary to viral gastroenteritis -CT abdomen pelvis showed diarrheal state. Correlation with clinical exam and stool cultures recommended. No bowel obstruction. -C. difficile positive for antigen, negative toxin which indicates bacteria present but not producing detectable level of toxin. No tx indicated.  -GI PCR negative  -Improved  CAP, left lower lobe -CXR showed Ill-defined opacity left base consistent with developing pneumonia. -Augmentin -Monitor WBC   Enterococcus UTI -Augmentin as above  Hyponatremia -Resolved    Hypertension -Continue Catapres, norvasc    Diabetes mellitus type 2 -Hemoglobin A1c 5.8 -Sliding scale insulin  Chronic pain syndrome -Used to be on Suboxone and oxycodone in the past, not currently on Suboxone  Hyperlipidemia -Continue Crestor  BPH -Continue Flomax  OSA -Noncompliant with CPAP  Migraine -Migraine cocktail ordered every 6 hours as needed  Anxiety -Resume home Ativan as needed   DVT prophylaxis:  enoxaparin (LOVENOX) injection 40 mg Start: 11/12/20 1000  Code Status: DNR Family Communication: No family at bedside Disposition Plan:  Status is: Inpatient  Remains inpatient appropriate because:Unsafe d/c plan   Dispo: The patient is from: Home              Anticipated d/c is to: SNF              Anticipated d/c date is: 1 day              Patient currently is medically stable to d/c.  Awaiting SNF placement   Consultants:   None  Procedures:   None  Antimicrobials:  Anti-infectives (From admission, onward)   Start     Dose/Rate Route Frequency Ordered Stop   11/14/20 1000  amoxicillin-clavulanate (AUGMENTIN) 875-125 MG per tablet 1 tablet        1 tablet Oral Every 12 hours 11/14/20 0722     11/13/20 1800  azithromycin (ZITHROMAX) tablet 500 mg  Status:  Discontinued        500 mg Oral Daily-1800 11/13/20 1359 11/14/20 0722   11/12/20 1000  hydroxychloroquine (PLAQUENIL) tablet 200 mg        200 mg Oral 2 times daily 11/11/20 1630     11/11/20 1800  cefTRIAXone (ROCEPHIN) 2 g in  sodium chloride 0.9 % 100 mL IVPB  Status:  Discontinued        2 g 200 mL/hr over 30 Minutes Intravenous Every 24 hours 11/11/20 1618 11/14/20 0722   11/11/20 1800  azithromycin (ZITHROMAX) 500 mg in sodium chloride 0.9 % 250 mL IVPB  Status:  Discontinued        500 mg 250 mL/hr over 60 Minutes Intravenous Every 24 hours 11/11/20 1618 11/13/20 1359   11/11/20 1645  cefTRIAXone (ROCEPHIN) 2 g in sodium chloride 0.9 % 100 mL IVPB  Status:  Discontinued         2 g 200 mL/hr over 30 Minutes Intravenous Every 24 hours 11/11/20 1630 11/11/20 1705   11/11/20 1645  azithromycin (ZITHROMAX) 500 mg in sodium chloride 0.9 % 250 mL IVPB  Status:  Discontinued        500 mg 250 mL/hr over 60 Minutes Intravenous Every 24 hours 11/11/20 1630 11/11/20 1705   11/11/20 1600  ciprofloxacin (CIPRO) IVPB 400 mg  Status:  Discontinued       "And" Linked Group Details   400 mg 200 mL/hr over 60 Minutes Intravenous  Once 11/11/20 1558 11/11/20 1617   11/11/20 1600  metroNIDAZOLE (FLAGYL) tablet 500 mg  Status:  Discontinued       "And" Linked Group Details   500 mg Oral Every 8 hours 11/11/20 1558 11/11/20 1617       Objective: Vitals:   11/16/20 1550 11/16/20 2204 11/17/20 0500 11/17/20 0528  BP:  (!) 152/89  (!) 166/96  Pulse:  89  88  Resp:  17  18  Temp:  98 F (36.7 C)  97.9 F (36.6 C)  TempSrc:  Oral  Oral  SpO2: 97% 98%  98%  Weight:   111.7 kg   Height:        Intake/Output Summary (Last 24 hours) at 11/17/2020 1013 Last data filed at 11/17/2020 1000 Gross per 24 hour  Intake 480 ml  Output 1400 ml  Net -920 ml   Filed Weights   11/11/20 1335 11/16/20 0502 11/17/20 0500  Weight: 106.6 kg 107.1 kg 111.7 kg    Examination: General exam: Appears calm and comfortable  Respiratory system: Clear to auscultation. Respiratory effort normal. Cardiovascular system: S1 & S2 heard, RRR. No pedal edema. Gastrointestinal system: Abdomen is nondistended, soft and nontender. Normal bowel sounds heard. Central nervous system: Alert and oriented. Non focal exam. Speech clear  Extremities: Symmetric in appearance bilaterally  Skin: No rashes, lesions or ulcers on exposed skin  Psychiatry: Stable   Data Reviewed: I have personally reviewed following labs and imaging studies  CBC: Recent Labs  Lab 11/11/20 1330 11/12/20 0543 11/13/20 0801 11/14/20 0522 11/15/20 0432 11/16/20 0415 11/17/20 0549  WBC 18.3*   < > 15.0* 16.7* 15.7* 10.9*  12.9*  NEUTROABS 14.7*  --   --   --   --   --   --   HGB 11.8*   < > 11.5* 11.8* 12.2* 11.8* 12.9*  HCT 35.2*   < > 35.5* 34.7* 36.9* 35.7* 38.4*  MCV 89.8   < > 92.2 90.4 92.0 90.6 91.4  PLT 458*   < > 414* 403* 417* 374 362   < > = values in this interval not displayed.   Basic Metabolic Panel: Recent Labs  Lab 11/13/20 0801 11/14/20 0522 11/15/20 0432 11/16/20 0415 11/17/20 0549  NA 133* 133* 134* 133* 135  K 3.7 2.9* 3.3* 3.7 3.8  CL 102  99 102 103 102  CO2 21* 22 21* 20* 21*  GLUCOSE 151* 168* 156* 182* 178*  BUN 14 19 28* 36* 38*  CREATININE 0.71 0.74 0.81 0.83 0.81  CALCIUM 9.4 9.5 9.7 9.8 9.8  MG  --   --   --  2.1  --    GFR: Estimated Creatinine Clearance: 94 mL/min (by C-G formula based on SCr of 0.81 mg/dL). Liver Function Tests: Recent Labs  Lab 11/11/20 1330 11/12/20 0543  AST 20 25  ALT 19 14  ALKPHOS 56 52  BILITOT 0.5 0.9  PROT 6.9 6.5  ALBUMIN 4.0 3.6   Recent Labs  Lab 11/11/20 1330  LIPASE 52*   No results for input(s): AMMONIA in the last 168 hours. Coagulation Profile: Recent Labs  Lab 11/11/20 1330  INR 1.0   Cardiac Enzymes: No results for input(s): CKTOTAL, CKMB, CKMBINDEX, TROPONINI in the last 168 hours. BNP (last 3 results) No results for input(s): PROBNP in the last 8760 hours. HbA1C: No results for input(s): HGBA1C in the last 72 hours. CBG: Recent Labs  Lab 11/16/20 0734 11/16/20 1137 11/16/20 1704 11/16/20 2205 11/17/20 0741  GLUCAP 190* 150* 163* 184* 170*   Lipid Profile: No results for input(s): CHOL, HDL, LDLCALC, TRIG, CHOLHDL, LDLDIRECT in the last 72 hours. Thyroid Function Tests: No results for input(s): TSH, T4TOTAL, FREET4, T3FREE, THYROIDAB in the last 72 hours. Anemia Panel: No results for input(s): VITAMINB12, FOLATE, FERRITIN, TIBC, IRON, RETICCTPCT in the last 72 hours. Sepsis Labs: Recent Labs  Lab 11/11/20 1330  LATICACIDVEN 0.8    Recent Results (from the past 240 hour(s))  Blood  culture (routine single)     Status: None   Collection Time: 11/11/20  1:30 PM   Specimen: BLOOD  Result Value Ref Range Status   Specimen Description   Final    BLOOD LEFT ANTECUBITAL Performed at [facility] [address] [place], [place] [address]    Special Requests   Final    BOTTLES DRAWN AEROBIC AND ANAEROBIC Blood Culture adequate volume Performed at [facility] [address] [place], [place] [address]    Culture   Final    NO GROWTH 5 DAYS Performed at [facility], [place] [address], [place], [place] [address]    Report Status 11/16/2020 FINAL  Final  Resp Panel by RT-PCR (Flu A&B, Covid) Nasopharyngeal Swab     Status: None   Collection Time: 11/11/20  2:14 PM   Specimen: Nasopharyngeal Swab; Nasopharyngeal(NP) swabs in vial transport medium  Result Value Ref Range Status   SARS Coronavirus 2 by RT PCR NEGATIVE NEGATIVE Final    Comment: (NOTE) SARS-CoV-2 target nucleic acids are NOT DETECTED.  The SARS-CoV-2 RNA is generally detectable in upper respiratory specimens during the acute phase of infection. The lowest concentration of SARS-CoV-2 viral copies this assay can detect is 138 copies/mL. A negative result does not preclude SARS-Cov-2 infection and should not be used as the sole basis for treatment or other patient management decisions. A negative result may occur with  improper specimen collection/handling, submission of specimen other than nasopharyngeal swab, presence of viral mutation(s) within the areas targeted by this assay, and inadequate number of viral copies(<138 copies/mL). A negative result must be combined with clinical observations, patient history, and epidemiological information. The expected result is Negative.  Fact Sheet for Patients:  EntrepreneurPulse.com.au  Fact Sheet for Healthcare Providers:  IncredibleEmployment.be  This test is no t yet approved  or cleared by the [place] and  has been authorized for detection and/or diagnosis of SARS-CoV-2 by FDA under an Emergency Use Authorization (EUA). This EUA will remain  in effect (meaning this test can be used) for the duration of the COVID-19 declaration under Section 564(b)(1) of the Act, 21 U.S.C.section 360bbb-3(b)(1), unless the authorization is terminated  or revoked sooner.       Influenza A by PCR NEGATIVE NEGATIVE Final   Influenza B by PCR NEGATIVE NEGATIVE Final    Comment: (NOTE) The Xpert Xpress SARS-CoV-2/FLU/RSV plus assay is intended as an aid in the diagnosis of influenza from Nasopharyngeal swab specimens and should not be used as a sole basis for treatment. Nasal washings and aspirates are unacceptable for Xpert Xpress SARS-CoV-2/FLU/RSV testing.  Fact Sheet for Patients: EntrepreneurPulse.com.au  Fact Sheet for Healthcare Providers: IncredibleEmployment.be  This test is not yet approved or cleared by the [place] FDA and has been authorized for detection and/or diagnosis of SARS-CoV-2 by FDA under an Emergency Use Authorization (EUA). This EUA will remain in effect (meaning this test can be used) for the duration of the COVID-19 declaration under Section 564(b)(1) of the Act, 21 U.S.C. section 360bbb-3(b)(1), unless the authorization is terminated or revoked.  Performed at [facility], [place] [address], [place], [place] [address]   Urine culture     Status: Abnormal   Collection Time: 11/11/20  4:00 PM   Specimen: In/Out Cath Urine  Result Value Ref Range Status   Specimen Description   Final    IN/OUT CATH URINE Performed at [facility] [address] [place], [place] [address]    Special Requests   Final    NONE Performed at [facility], [place] [address] [place], [place] [address]    Culture >=100,000 COLONIES/mL ENTEROCOCCUS FAECALIS (A)   Final   Report Status 11/14/2020 FINAL  Final   Organism ID, Bacteria ENTEROCOCCUS FAECALIS (A)  Final      Susceptibility   Enterococcus faecalis - MIC*    AMPICILLIN <=2 SENSITIVE Sensitive     NITROFURANTOIN <=16 SENSITIVE Sensitive     VANCOMYCIN 1 SENSITIVE Sensitive     * >=100,000 COLONIES/mL ENTEROCOCCUS FAECALIS  Gastrointestinal Panel by PCR , Stool     Status: None   Collection Time: 11/12/20  6:41 AM   Specimen: STOOL  Result Value Ref Range Status   Campylobacter species NOT DETECTED NOT DETECTED Final   Plesimonas shigelloides NOT DETECTED NOT DETECTED Final   Salmonella species NOT DETECTED NOT DETECTED Final   Yersinia enterocolitica NOT DETECTED NOT DETECTED Final   Vibrio species NOT DETECTED NOT DETECTED Final   Vibrio cholerae NOT DETECTED NOT DETECTED Final   Enteroaggregative E coli (EAEC) NOT DETECTED NOT DETECTED Final   Enteropathogenic E coli (EPEC) NOT DETECTED NOT DETECTED Final   Enterotoxigenic E coli (ETEC) NOT DETECTED NOT DETECTED Final   Shiga like toxin producing E coli (STEC) NOT DETECTED NOT DETECTED Final   Shigella/Enteroinvasive E coli (EIEC) NOT DETECTED NOT DETECTED Final   Cryptosporidium NOT DETECTED NOT DETECTED Final   Cyclospora cayetanensis NOT DETECTED NOT DETECTED Final   Entamoeba histolytica NOT DETECTED NOT DETECTED Final   Giardia lamblia NOT DETECTED NOT DETECTED Final   Adenovirus F40/41 NOT DETECTED NOT DETECTED Final   Astrovirus NOT DETECTED NOT DETECTED Final   Norovirus GI/GII NOT DETECTED NOT DETECTED Final   Rotavirus A NOT DETECTED NOT DETECTED Final   Sapovirus (I,  II, IV, and V) NOT DETECTED NOT DETECTED Final    Comment: Performed at [facility], [place], [place] [address]  C Difficile Quick Screen (NO PCR Reflex)     Status: Abnormal   Collection Time: 11/12/20  6:42 AM   Specimen: STOOL  Result Value Ref Range Status   C Diff antigen POSITIVE (A) NEGATIVE Final   C Diff toxin  NEGATIVE NEGATIVE Final   C Diff interpretation   Final    Results are indeterminate. Please contact the provider listed for your campus for C diff questions in [place].    Comment: Performed at [facility], [place] [address] [place], [place] [address]      Radiology Studies: No results found.    Scheduled Meds: . (feeding supplement) PROSource Plus  30 mL Oral BID WC  . amLODipine  10 mg Oral Daily  . amoxicillin-clavulanate  1 tablet Oral Q12H  . cloNIDine  0.1 mg Oral BID  . DULoxetine  30 mg Oral TID  . enoxaparin (LOVENOX) injection  40 mg Subcutaneous Q24H  . feeding supplement  1 Container Oral BID BM  . gabapentin  300 mg Oral TID  . hydroxychloroquine  200 mg Oral BID  . insulin aspart  0-15 Units Subcutaneous TID WC  . insulin aspart  0-5 Units Subcutaneous QHS  . pantoprazole  40 mg Oral Daily  . rosuvastatin  40 mg Oral QPM  . tamsulosin  0.4 mg Oral QPC supper   Continuous Infusions:    LOS: 6 days      Time spent: 25 minutes   Dessa Phi, DO [facility] 11/17/2020, 10:13 AM   Available via Epic secure chat 7am-7pm After these hours, please refer to coverage provider listed on [email or web address]

## 2020-11-17 NOTE — TOC Progression Note (Signed)
Transition of Care [facility]) - Progression Note    Patient Details  Name: Jorge Sherman MRN: [id number] Date of Birth: 10/07/1941  [facility] CM/SW Contact  Clearance Coots, LCSW Phone Number: 11/17/2020, 5:56 PM  Clinical Narrative:    Daughter return call. She prefers the patient make his own decision about SNF.  CSW followed up with the patient and again provided SNF options.  CSW faxed patient to additional SNF's.  Patient notified he is medically stable and will need to make a decision. Patient again requested his daughter input. Voicemail left for the patient daughter.  Patient notified [employer] insurance cannot process the SNF authorization request until a facility choice is made.  Patient to make a decision after talking with his daughter.   Expected Discharge Plan: Skilled Nursing Facility Barriers to Discharge: Insurance Authorization,Other (comment) (covid test)  Expected Discharge Plan and Services Expected Discharge Plan: Skilled Nursing Facility In-house Referral: NA Discharge Planning Services: NA Post Acute Care Choice: Skilled Nursing Facility Living arrangements for the past 2 months: Apartment                 DME Arranged: N/A DME Agency: NA       HH Arranged: NA HH Agency: NA         Social Determinants of Health (SDOH) Interventions    Readmission Risk Interventions Readmission Risk Prevention Plan 11/17/2020 06/29/2020  Transportation Screening Complete Complete  Home Care Screening - Complete  Medication Review (RN CM) - Complete  Medication Review [employer]) Complete -  PCP or Specialist appointment within 3-5 days of discharge Complete -  SW Recovery Care/Counseling Consult Complete -  Skilled Nursing Facility Complete -  Some recent data might be hidden

## 2020-11-18 DIAGNOSIS — I10 Essential (primary) hypertension: Secondary | ICD-10-CM | POA: Diagnosis not present

## 2020-11-18 DIAGNOSIS — G894 Chronic pain syndrome: Secondary | ICD-10-CM | POA: Diagnosis not present

## 2020-11-18 DIAGNOSIS — R197 Diarrhea, unspecified: Secondary | ICD-10-CM | POA: Diagnosis not present

## 2020-11-18 LAB — CBC
HCT: 36.5 % — ABNORMAL LOW (ref 39.0–52.0)
Hemoglobin: 12.2 g/dL — ABNORMAL LOW (ref 13.0–17.0)
MCH: 30.2 pg (ref 26.0–34.0)
MCHC: 33.4 g/dL (ref 30.0–36.0)
MCV: 90.3 fL (ref 80.0–100.0)
Platelets: 356 10*3/uL (ref 150–400)
RBC: 4.04 MIL/uL — ABNORMAL LOW (ref 4.22–5.81)
RDW: 13.4 % (ref 11.5–15.5)
WBC: 18 10*3/uL — ABNORMAL HIGH (ref 4.0–10.5)
nRBC: 0 % (ref 0.0–0.2)

## 2020-11-18 LAB — BASIC METABOLIC PANEL
Anion gap: 10 (ref 5–15)
BUN: 37 mg/dL — ABNORMAL HIGH (ref 8–23)
CO2: 21 mmol/L — ABNORMAL LOW (ref 22–32)
Calcium: 9.3 mg/dL (ref 8.9–10.3)
Chloride: 100 mmol/L (ref 98–111)
Creatinine, Ser: 0.73 mg/dL (ref 0.61–1.24)
GFR, Estimated: >60 mL/min (ref >60)
Glucose, Bld: 146 mg/dL — ABNORMAL HIGH (ref 70–99)
Potassium: 3.5 mmol/L (ref 3.5–5.1)
Sodium: 131 mmol/L — ABNORMAL LOW (ref 135–145)

## 2020-11-18 LAB — GLUCOSE, CAPILLARY
Glucose-Capillary: 158 mg/dL — ABNORMAL HIGH (ref 70–99)
Glucose-Capillary: 179 mg/dL — ABNORMAL HIGH (ref 70–99)
Glucose-Capillary: 156 mg/dL — ABNORMAL HIGH (ref 70–99)
Glucose-Capillary: 182 mg/dL — ABNORMAL HIGH (ref 70–99)

## 2020-11-18 NOTE — Progress Notes (Signed)
Occupational Therapy Treatment Patient Details Name: Jorge Sherman MRN: [id number] DOB: 04-26-1941 Today's Date: 11/18/2020    History of present illness Jorge Sherman is a 79 y.o. male with medical history significant of diet-controlled diabetes mellitus type 2, hypertension, chronic pain, OSA noncompliant with CPAP, hyperlipidemia, BPH, recent admission from 10/29/2020-11/03/2020 with fall and chest wall/flank pain along with leukocytosis, dehydration and hyponatremia treated with IV fluids and subsequent discharge home despite PT/OT recommending SNF presented with multiple episodes of vomiting.  Patient states that he has been having nausea and multiple episodes of nonbloody nonbilious vomiting since last night along with multiple episodes of watery diarrhea and midabdominal pain. He was found to have a temperature of 101 in the EMS.  Patient complains of myalgias   OT comments  Treatment focused on functional mobility and ADLs. Patient mod assist to transfer to side of bed - with patient needing assistance for trunk lift off and use of bed pad to pivot patient to edge of bed. Patient limited during task due to shoulder pain and weakness - exhibiting difficulty using upper extremities to pull himself up. Patient stood x 3 with use of stedy - however, unable to fully utilize stedy as therapist was unable to drop flaps underneath patient's bottom. Will need bari-stedy in the future. Patient incontinent and required total assist for pericare. Patient stood x 2 for pericare today with difficulty maintaining squat/partial stand position greater than 10 seconds and reporting pain in bilateral shoulders with weight bearing through forearms on stedy. Patient returned to supine with mod assist. Patient reporting not being fed by nursing staff and therapist positioned patient upright in bed and dropped height of bedside table. Therapist provided patient with spoon and applesauce. Patient grossly able to  perform hand to mouth with left non-dominant hand and reports not wanting to attempt due to pain with dominant RUE. Therapist encouraged patient to use dominant hand despite pain to improve functional use of RUE and maintain motion in what appears to be arthritic joint (crepitus noted in right shoulder). Of interest, patient reports bilateral scapular fractures six weeks ago and being treated at [facility]. No fractures have been reported with any imaging here. Patient demonstrated ability to wash his face with his left hand and to answer the telephone. Patient required encouragement to perform these tasks on his own as he initially asks therapist for assistance. Cont to recommend short term rehab at discharge to maximize patient's functional abilities and reduce caregiver burden.   Follow Up Recommendations  SNF    Equipment Recommendations  None recommended by OT    Recommendations for Other Services      Precautions / Restrictions Precautions Precautions: Fall Restrictions Weight Bearing Restrictions: No       Mobility Bed Mobility Overal bed mobility: Needs Assistance       Supine to sit: Mod assist;HOB elevated Sit to supine: Mod assist   General bed mobility comments: MOd assist for trunk lift off and use of bed pad to pivot patient to side of bed. Patinet limited by shoulder weakness and pain - limiting his ability to pull himself upright.  Transfers Overall transfer level: Needs assistance   Transfers: Sit to/from Stand Sit to Stand: Min assist;From elevated surface         General transfer comment: Performed sit to stand x 3 with stedy with one standing position near enough to erect but flaps unable to get underneth patient's bottom. Performed two more times for pericare. Patient unable to maintain  partial squat/standing position longer than 10 seconds and reporting pain in shoulders with weight bearing with use of stedy.    Balance Overall balance assessment: Needs  assistance Sitting-balance support: No upper extremity supported;Feet supported Sitting balance-Leahy Scale: Fair     Standing balance support: Bilateral upper extremity supported;During functional activity Standing balance-Leahy Scale: Poor                             ADL either performed or assessed with clinical judgement   ADL   Eating/Feeding: Set up;Bed level;Minimal assistance Eating/Feeding Details (indicate cue type and reason): Patient returned to supine after inability to come into full standing. Patient setup maximized for self feeting. Upright postion and bedside table in low position. Patinet able to grossly use left hand to bring hand to mouth. Reports he is unable to do so with right hand due to right shoulder pain and "Makign a mess." Patient fairly adamant about needing to be fed. Patient recommended upright positioning, low table and potentially finger foods and patient stated "That's what I want to order but they won't do it." Patient provided with new menu - as menu not available in room and - and telephone. Patient demonstrated ability to use phone as phone rang when therapist in the room and therapist had patient answer it.Therapist also instructed patient to use right arm despite shoulder pain to keep arm functional and reduce continued pain and weakness. Grooming: Bed level;Set up;Wash/dry face Grooming Details (indicate cue type and reason): Patient requested therapist to wipe his eyes stating "something is in my eyes." Therapist provided with warm cloth and patient washed area around the eyes with left hand.                     Toileting- Water quality scientist and Hygiene: Sit to/from stand;Total assistance Toileting - Clothing Manipulation Details (indicate cue type and reason): Total assist to clean up after small incontinence of bowels performing sit to stands x 3 with use of stedy. Patient unable to maintain standing position longer than 10  seconds, unable to come into full erect standing and reporting shoulder pain with use of arms on stedy.             Vision Patient Visual Report: No change from baseline     Perception     Praxis      Cognition Arousal/Alertness: Awake/alert Behavior During Therapy: WFL for tasks assessed/performed Overall Cognitive Status: Within Functional Limits for tasks assessed                                          Exercises     Shoulder Instructions       General Comments      Pertinent Vitals/ Pain       Pain Assessment: Faces Faces Pain Scale: Hurts even more Pain Location: B shoulders with use of stedy, R > L Pain Descriptors / Indicators: Discomfort;Grimacing;Aching;Moaning Pain Intervention(s): Limited activity within patient's tolerance;Monitored during session;Premedicated before session  Home Living                                          Prior Functioning/Environment  Frequency  Min 2X/week        Progress Toward Goals  OT Goals(current goals can now be found in the care plan section)  Progress towards OT goals: Progressing toward goals  Acute Rehab OT Goals Patient Stated Goal: to get stronger and return to baseline OT Goal Formulation: With patient Time For Goal Achievement: 11/26/20 Potential to Achieve Goals: [place] Discharge plan remains appropriate    Co-evaluation                 AM-PAC OT "6 Clicks" Daily Activity     Outcome Measure   Help from another person eating meals?: A Little Help from another person taking care of personal grooming?: A Little Help from another person toileting, which includes using toliet, bedpan, or urinal?: Total Help from another person bathing (including washing, rinsing, drying)?: Total Help from another person to put on and taking off regular upper body clothing?: A Lot Help from another person to put on and taking off regular lower body  clothing?: Total 6 Click Score: 11    End of Session Equipment Utilized During Treatment: Other (comment) (stedy)  OT Visit Diagnosis: Unsteadiness on feet (R26.81);Other abnormalities of gait and mobility (R26.89);Muscle weakness (generalized) (M62.81);History of falling (Z91.81);Repeated falls (R29.6);Pain Pain - Right/Left:  (bilateral) Pain - part of body: Shoulder   Activity Tolerance Patient limited by pain   Patient Left in bed;with call bell/phone within reach;with bed alarm set;with nursing/sitter in room   Nurse Communication  (okay to see per Rn)        [id number] OT Time Calculation (min): 27 min  Charges: OT General Charges $OT Visit: 1 Visit OT Treatments $Self Care/Home Management : 8-22 mins $Therapeutic Activity: 8-22 mins  Derl Barrow, OTR/L [place]  Office [phone] Pager: [place] 11/18/2020, 10:33 AM

## 2020-11-18 NOTE — TOC Progression Note (Signed)
Transition of Care [facility]) - Progression Note    Patient Details  Name: Jorge Sherman MRN: [id number] Date of Birth: 09-11-1941  [facility] CM/SW Contact  Clearance Coots, LCSW Phone Number: 11/18/2020, 11:28 AM  Clinical Narrative:    CSW reached out to the patient daughter this am to discuss patient SNF bed offers.  CSW emailed the [employer] gov list of ratings for review.  Daughter notified the patient is medically stable to transfer.    Expected Discharge Plan: Skilled Nursing Facility Barriers to Discharge: Insurance Authorization,Other (comment) (covid test)  Expected Discharge Plan and Services Expected Discharge Plan: Skilled Nursing Facility In-house Referral: NA Discharge Planning Services: NA Post Acute Care Choice: Skilled Nursing Facility Living arrangements for the past 2 months: Apartment                 DME Arranged: N/A DME Agency: NA       HH Arranged: NA HH Agency: NA         Social Determinants of Health (SDOH) Interventions    Readmission Risk Interventions Readmission Risk Prevention Plan 11/17/2020 06/29/2020  Transportation Screening Complete Complete  Home Care Screening - Complete  Medication Review (RN CM) - Complete  Medication Review Oceanographer) Complete -  PCP or Specialist appointment within 3-5 days of discharge Complete -  SW Recovery Care/Counseling Consult Complete -  Skilled Nursing Facility Complete -  Some recent data might be hidden

## 2020-11-18 NOTE — Progress Notes (Signed)
[facility]  PROGRESS NOTE  Jorge Sherman [id number] DOB: 01-03-1941 DOA: 11/11/2020 PCP: Merrilee Seashore, MD   Brief HPI:   79 year old male with medical history of diet-controlled diabetes mellitus type 2, hypertension, chronic pain syndrome, OSA noncompliant with CPAP, hyperlipidemia, BPH, recent admission from 10/29/2020 to 11/03/2020 with fall and chest wall/flank pain along with leukocytosis, dehydration, hyponatremia treated with IV fluids and subsequent discharge home despite PT OT recommending skilled nursing facility presenting with multiple episodes of vomiting.  Patient states he has been having nausea and vomiting along with mid abdominal pain.    Subjective   Patient seen and examined, denies nausea vomiting or diarrhea at this time.  No abdominal pain.   Assessment/Plan:     1. Gastroenteritis-patient presented with nausea vomiting/diarrhea, resolved.  CT abdomen pelvis showed no acute inflammatory changes, showed no stool throughout colon.  C. difficile was positive for antigen, negative for toxin, no treatment indicated.  GI pathogen panel negative.  Patient symptoms have resolved. 2. Community-acquired pneumonia-chest x-ray showed ill-defined opacity in left base consistent with developing pneumonia, patient is currently on Augmentin.  Patient has received 8 days of antibiotic treatment in the hospital.  Will discontinue antibiotics at this time. 3. Enterococcus UTI-patient has been treated with Augmentin for 5 days, already received ceftriaxone for 3 days.  Antibiotics have been discontinued. 4. Hypertension-continue Catapres, Norvasc 5. Hyponatremia-resolved 6. Diabetes mellitus type 2-continue sliding scale insulin NovoLog, hemoglobin A1c is 5.86 7. ? History of rheumatoid arthritis-patient is on hydroxychloroquine 200 mg p.o. twice daily.     COVID-19 Labs  No results for input(s): DDIMER, FERRITIN, LDH, CRP in the last 72 hours.  Lab Results   Component Value Date   SARSCOV2NAA NEGATIVE 11/17/2020   SARSCOV2NAA NEGATIVE 11/11/2020   [place] NEGATIVE 10/29/2020   [place] NEGATIVE 07/06/2020     Scheduled medications:   . (feeding supplement) PROSource Plus  30 mL Oral BID WC  . amLODipine  10 mg Oral Daily  . amoxicillin-clavulanate  1 tablet Oral Q12H  . cloNIDine  0.1 mg Oral BID  . DULoxetine  30 mg Oral TID  . enoxaparin (LOVENOX) injection  40 mg Subcutaneous Q24H  . feeding supplement  1 Container Oral BID BM  . gabapentin  300 mg Oral TID  . hydroxychloroquine  200 mg Oral BID  . insulin aspart  0-15 Units Subcutaneous TID WC  . insulin aspart  0-5 Units Subcutaneous QHS  . pantoprazole  40 mg Oral Daily  . rosuvastatin  40 mg Oral QPM  . tamsulosin  0.4 mg Oral QPC supper         CBG: Recent Labs  Lab 11/17/20 1141 11/17/20 1647 11/17/20 2059 11/18/20 0739 11/18/20 1158  GLUCAP 178* 172* 152* 158* 179*    SpO2: 98 %    CBC: Recent Labs  Lab 11/14/20 0522 11/15/20 0432 11/16/20 0415 11/17/20 0549 11/18/20 0446  WBC 16.7* 15.7* 10.9* 12.9* 18.0*  HGB 11.8* 12.2* 11.8* 12.9* 12.2*  HCT 34.7* 36.9* 35.7* 38.4* 36.5*  MCV 90.4 92.0 90.6 91.4 90.3  PLT 403* 417* 374 362 [id number]    Basic Metabolic Panel: Recent Labs  Lab 11/14/20 0522 11/15/20 0432 11/16/20 0415 11/17/20 0549 11/18/20 0446  NA 133* 134* 133* 135 131*  K 2.9* 3.3* 3.7 3.8 3.5  CL 99 102 103 102 100  CO2 22 21* 20* 21* 21*  GLUCOSE 168* 156* 182* 178* 146*  BUN 19 28* 36* 38* 37*  CREATININE 0.74 0.81 0.83 0.81  0.73  CALCIUM 9.5 9.7 9.8 9.8 9.3  MG  --   --  2.1  --   --      Liver Function Tests: Recent Labs  Lab 11/12/20 0543  AST 25  ALT 14  ALKPHOS 52  BILITOT 0.9  PROT 6.5  ALBUMIN 3.6     Antibiotics: Anti-infectives (From admission, onward)   Start     Dose/Rate Route Frequency Ordered Stop   11/14/20 1000  amoxicillin-clavulanate (AUGMENTIN) 875-125 MG per tablet 1 tablet        1  tablet Oral Every 12 hours 11/14/20 0722     11/13/20 1800  azithromycin (ZITHROMAX) tablet 500 mg  Status:  Discontinued        500 mg Oral Daily-1800 11/13/20 1359 11/14/20 0722   11/12/20 1000  hydroxychloroquine (PLAQUENIL) tablet 200 mg        200 mg Oral 2 times daily 11/11/20 1630     11/11/20 1800  cefTRIAXone (ROCEPHIN) 2 g in sodium chloride 0.9 % 100 mL IVPB  Status:  Discontinued        2 g 200 mL/hr over 30 Minutes Intravenous Every 24 hours 11/11/20 1618 11/14/20 0722   11/11/20 1800  azithromycin (ZITHROMAX) 500 mg in sodium chloride 0.9 % 250 mL IVPB  Status:  Discontinued        500 mg 250 mL/hr over 60 Minutes Intravenous Every 24 hours 11/11/20 1618 11/13/20 1359   11/11/20 1645  cefTRIAXone (ROCEPHIN) 2 g in sodium chloride 0.9 % 100 mL IVPB  Status:  Discontinued        2 g 200 mL/hr over 30 Minutes Intravenous Every 24 hours 11/11/20 1630 11/11/20 1705   11/11/20 1645  azithromycin (ZITHROMAX) 500 mg in sodium chloride 0.9 % 250 mL IVPB  Status:  Discontinued        500 mg 250 mL/hr over 60 Minutes Intravenous Every 24 hours 11/11/20 1630 11/11/20 1705   11/11/20 1600  ciprofloxacin (CIPRO) IVPB 400 mg  Status:  Discontinued       "And" Linked Group Details   400 mg 200 mL/hr over 60 Minutes Intravenous  Once 11/11/20 1558 11/11/20 1617   11/11/20 1600  metroNIDAZOLE (FLAGYL) tablet 500 mg  Status:  Discontinued       "And" Linked Group Details   500 mg Oral Every 8 hours 11/11/20 1558 11/11/20 1617       DVT prophylaxis: Lovenox  Code Status:  DNR  Family Communication: No family at bedside   Consultants:    Procedures:      Objective   Vitals:   11/17/20 1404 11/17/20 2109 11/18/20 0515 11/18/20 1358  BP: (!) 169/90 (!) 154/83 (!) 149/94 (!) 152/71  Pulse: 97 94 87 96  Resp: 18 17 17 17   Temp: (!) 97.2 F (36.2 C) 97.8 F (36.6 C) 97.7 F (36.5 C) 98.2 F (36.8 C)  TempSrc: Oral Oral Oral   SpO2: 98% 99% 97% 98%  Weight:   111.5 kg    Height:        Intake/Output Summary (Last 24 hours) at 11/18/2020 1440 Last data filed at 11/18/2020 1300 Gross per 24 hour  Intake 1140 ml  Output 1500 ml  Net -360 ml    12/27 1901 - 12/29 0700 In: 1440 [P.O.:1440] Out: 2300 [Urine:2300]  Filed Weights   11/16/20 0502 11/17/20 0500 11/18/20 0515  Weight: 107.1 kg 111.7 kg 111.5 kg    Physical Examination:   General-appears in no  acute distress Heart-S1-S2, regular, no murmur auscultated Lungs-clear to auscultation bilaterally, no wheezing or crackles auscultated Abdomen-soft, nontender, no organomegaly Extremities-no edema in the lower extremities Neuro-alert, oriented x3, no focal deficit noted  Status is: Inpatient  Dispo: The patient is from: Home              Anticipated d/c is to: Skilled nursing facility              Anticipated d/c date is: 11/19/2020              Patient currently is medically stable for discharge  Barrier to discharge-awaiting bed at skilled nursing facility       Data Reviewed:   Recent Results (from the past 240 hour(s))  Blood culture (routine single)     Status: None   Collection Time: 11/11/20  1:30 PM   Specimen: BLOOD  Result Value Ref Range Status   Specimen Description   Final    BLOOD LEFT ANTECUBITAL Performed at [facility] [address] [place], [place] [address]    Special Requests   Final    BOTTLES DRAWN AEROBIC AND ANAEROBIC Blood Culture adequate volume Performed at [facility] [address] [place], [place] [address]    Culture   Final    NO GROWTH 5 DAYS Performed at [facility], [place] [address] [place], [place] [address]    Report Status 11/16/2020 FINAL  Final  Resp Panel by RT-PCR (Flu A&B, Covid) Nasopharyngeal Swab     Status: None   Collection Time: 11/11/20  2:14 PM   Specimen: Nasopharyngeal Swab; Nasopharyngeal(NP) swabs in vial transport medium  Result Value Ref Range Status   SARS  Coronavirus 2 by RT PCR NEGATIVE NEGATIVE Final    Comment: (NOTE) SARS-CoV-2 target nucleic acids are NOT DETECTED.  The SARS-CoV-2 RNA is generally detectable in upper respiratory specimens during the acute phase of infection. The lowest concentration of SARS-CoV-2 viral copies this assay can detect is 138 copies/mL. A negative result does not preclude SARS-Cov-2 infection and should not be used as the sole basis for treatment or other patient management decisions. A negative result may occur with  improper specimen collection/handling, submission of specimen other than nasopharyngeal swab, presence of viral mutation(s) within the areas targeted by this assay, and inadequate number of viral copies(<138 copies/mL). A negative result must be combined with clinical observations, patient history, and epidemiological information. The expected result is Negative.  Fact Sheet for Patients:  EntrepreneurPulse.com.au  Fact Sheet for Healthcare Providers:  IncredibleEmployment.be  This test is no t yet approved or cleared by the [place] FDA and  has been authorized for detection and/or diagnosis of SARS-CoV-2 by FDA under an Emergency Use Authorization (EUA). This EUA will remain  in effect (meaning this test can be used) for the duration of the COVID-19 declaration under Section 564(b)(1) of the Act, 21 U.S.C.section 360bbb-3(b)(1), unless the authorization is terminated  or revoked sooner.       Influenza A by PCR NEGATIVE NEGATIVE Final   Influenza B by PCR NEGATIVE NEGATIVE Final    Comment: (NOTE) The Xpert Xpress SARS-CoV-2/FLU/RSV plus assay is intended as an aid in the diagnosis of influenza from Nasopharyngeal swab specimens and should not be used as a sole basis for treatment. Nasal washings and aspirates are unacceptable for Xpert Xpress SARS-CoV-2/FLU/RSV testing.  Fact Sheet for  Patients: EntrepreneurPulse.com.au  Fact Sheet for Healthcare Providers: IncredibleEmployment.be  This test is  not yet approved or cleared by the [place] and has been authorized for detection and/or diagnosis of SARS-CoV-2 by FDA under an Emergency Use Authorization (EUA). This EUA will remain in effect (meaning this test can be used) for the duration of the COVID-19 declaration under Section 564(b)(1) of the Act, 21 U.S.C. section 360bbb-3(b)(1), unless the authorization is terminated or revoked.  Performed at [facility] [place], [place] [address] [place], [place] [address]   Urine culture     Status: Abnormal   Collection Time: 11/11/20  4:00 PM   Specimen: In/Out Cath Urine  Result Value Ref Range Status   Specimen Description   Final    IN/OUT CATH URINE Performed at [facility] [address] [place], [place] [address]    Special Requests   Final    NONE Performed at [facility], [place] [address] [place], [place] [address]    Culture >=100,000 COLONIES/mL ENTEROCOCCUS FAECALIS (A)  Final   Report Status 11/14/2020 FINAL  Final   Organism ID, Bacteria ENTEROCOCCUS FAECALIS (A)  Final      Susceptibility   Enterococcus faecalis - MIC*    AMPICILLIN <=2 SENSITIVE Sensitive     NITROFURANTOIN <=16 SENSITIVE Sensitive     VANCOMYCIN 1 SENSITIVE Sensitive     * >=100,000 COLONIES/mL ENTEROCOCCUS FAECALIS  Gastrointestinal Panel by PCR , Stool     Status: None   Collection Time: 11/12/20  6:41 AM   Specimen: STOOL  Result Value Ref Range Status   Campylobacter species NOT DETECTED NOT DETECTED Final   Plesimonas shigelloides NOT DETECTED NOT DETECTED Final   Salmonella species NOT DETECTED NOT DETECTED Final   Yersinia enterocolitica NOT DETECTED NOT DETECTED Final   Vibrio species NOT DETECTED NOT DETECTED Final   Vibrio cholerae NOT DETECTED NOT DETECTED Final    Enteroaggregative E coli (EAEC) NOT DETECTED NOT DETECTED Final   Enteropathogenic E coli (EPEC) NOT DETECTED NOT DETECTED Final   Enterotoxigenic E coli (ETEC) NOT DETECTED NOT DETECTED Final   Shiga like toxin producing E coli (STEC) NOT DETECTED NOT DETECTED Final   Shigella/Enteroinvasive E coli (EIEC) NOT DETECTED NOT DETECTED Final   Cryptosporidium NOT DETECTED NOT DETECTED Final   Cyclospora cayetanensis NOT DETECTED NOT DETECTED Final   Entamoeba histolytica NOT DETECTED NOT DETECTED Final   Giardia lamblia NOT DETECTED NOT DETECTED Final   Adenovirus F40/41 NOT DETECTED NOT DETECTED Final   Astrovirus NOT DETECTED NOT DETECTED Final   Norovirus GI/GII NOT DETECTED NOT DETECTED Final   Rotavirus A NOT DETECTED NOT DETECTED Final   Sapovirus (I, II, IV, and V) NOT DETECTED NOT DETECTED Final    Comment: Performed at [facility], [place] [place], [place] [address]  C Difficile Quick Screen (NO PCR Reflex)     Status: Abnormal   Collection Time: 11/12/20  6:42 AM   Specimen: STOOL  Result Value Ref Range Status   C Diff antigen POSITIVE (A) NEGATIVE Final   C Diff toxin NEGATIVE NEGATIVE Final   C Diff interpretation   Final    Results are indeterminate. Please contact the provider listed for your campus for C diff questions in [place].    Comment: Performed at [facility], [place] [address], [place], [place] [address]  SARS CORONAVIRUS 2 (TAT 6-24 HRS) Nasopharyngeal Nasopharyngeal Swab     Status: None   Collection Time: 11/17/20 10:07 AM   Specimen: Nasopharyngeal Swab  Result Value  Ref Range Status   SARS Coronavirus 2 NEGATIVE NEGATIVE Final    Comment: (NOTE) SARS-CoV-2 target nucleic acids are NOT DETECTED.  The SARS-CoV-2 RNA is generally detectable in upper and lower respiratory specimens during the acute phase of infection. Negative results do not preclude SARS-CoV-2 infection, do not rule out co-infections with other pathogens, and  should not be used as the sole basis for treatment or other patient management decisions. Negative results must be combined with clinical observations, patient history, and epidemiological information. The expected result is Negative.  Fact Sheet for Patients: SugarRoll.be  Fact Sheet for Healthcare Providers: https://www.woods-mathews.com/  This test is not yet approved or cleared by the [place] FDA and  has been authorized for detection and/or diagnosis of SARS-CoV-2 by FDA under an Emergency Use Authorization (EUA). This EUA will remain  in effect (meaning this test can be used) for the duration of the COVID-19 declaration under Se ction 564(b)(1) of the Act, 21 U.S.C. section 360bbb-3(b)(1), unless the authorization is terminated or revoked sooner.  Performed at [facility], [place] [address], [place], [place] [address]          [facility] If 7PM-7AM, please contact night-coverage at [email or web address], Office  [phone]   11/18/2020, 2:40 PM  LOS: 7 days

## 2020-11-19 DIAGNOSIS — R197 Diarrhea, unspecified: Secondary | ICD-10-CM | POA: Diagnosis not present

## 2020-11-19 DIAGNOSIS — I10 Essential (primary) hypertension: Secondary | ICD-10-CM | POA: Diagnosis not present

## 2020-11-19 DIAGNOSIS — G894 Chronic pain syndrome: Secondary | ICD-10-CM | POA: Diagnosis not present

## 2020-11-19 DIAGNOSIS — D72829 Elevated white blood cell count, unspecified: Secondary | ICD-10-CM | POA: Diagnosis not present

## 2020-11-19 DIAGNOSIS — N390 Urinary tract infection, site not specified: Secondary | ICD-10-CM

## 2020-11-19 LAB — GLUCOSE, CAPILLARY
Glucose-Capillary: 127 mg/dL — ABNORMAL HIGH (ref 70–99)
Glucose-Capillary: 123 mg/dL — ABNORMAL HIGH (ref 70–99)
Glucose-Capillary: 121 mg/dL — ABNORMAL HIGH (ref 70–99)

## 2020-11-19 LAB — TROPONIN I (HIGH SENSITIVITY): Troponin I (High Sensitivity): 8 ng/L (ref <18)

## 2020-11-19 MED ORDER — GLUCERNA SHAKE PO LIQD
237.0000 mL | Freq: Three times a day (TID) | ORAL | Status: DC
  Administered 2020-11-19 – 2020-11-26 (×18): 237 mL via ORAL
  Filled 2020-11-19 (×23): qty 237

## 2020-11-19 MED ORDER — OXYCODONE HCL 5 MG PO TABS
5.0000 mg | ORAL_TABLET | Freq: Four times a day (QID) | ORAL | Status: DC | PRN
  Administered 2020-11-19 – 2020-11-28 (×25): 5 mg via ORAL
  Filled 2020-11-19 (×26): qty 1

## 2020-11-19 NOTE — Progress Notes (Signed)
Patient had complaints of left sided chest pain. EKG completed. MD aware.

## 2020-11-19 NOTE — Progress Notes (Signed)
[facility]  PROGRESS NOTE  Jorge Sherman [id number] DOB: 05-20-1941 DOA: 11/11/2020 PCP: Merrilee Seashore, MD   Brief HPI:   79 year old male with medical history of diet-controlled diabetes mellitus type 2, hypertension, chronic pain syndrome, OSA noncompliant with CPAP, hyperlipidemia, BPH, recent admission from 10/29/2020 to 11/03/2020 with fall and chest wall/flank pain along with leukocytosis, dehydration, hyponatremia treated with IV fluids and subsequent discharge home despite PT OT recommending skilled nursing facility presenting with multiple episodes of vomiting.  Patient states he has been having nausea and vomiting along with mid abdominal pain.    Subjective   Patient seen and examined, complains of chest pain this morning.  EKG was unremarkable.  Troponin x2 have been ordered.  Patient does have chronic pain syndrome and wants his all his medications to be given.   Assessment/Plan:     1. Gastroenteritis-patient presented with nausea vomiting/diarrhea, resolved.  CT abdomen pelvis showed no acute inflammatory changes, showed no stool throughout colon.  C. difficile was positive for antigen, negative for toxin, no treatment indicated.  GI pathogen panel negative.  Patient symptoms have resolved.   2. Chronic pain syndrome-patient has chronic back pain and is on multiple medications.  Continue gabapentin, duloxetine, tramadol, oxycodone, hydroxychloroquine, Robaxin.  We will continue these medications at this time. 3. Chest pain-likely musculoskeletal started after patient was sitting in a chair from bed.  EKG is unremarkable, will obtain troponin x2, continue above medications. 4. Community-acquired pneumonia-chest x-ray showed ill-defined opacity in left base consistent with developing pneumonia, patient is currently on Augmentin.  Patient has received 8 days of antibiotic treatment in the hospital.  Will discontinue antibiotics at this time. 5. Enterococcus  UTI-patient has been treated with Augmentin for 5 days, already received ceftriaxone for 3 days.  Antibiotics have been discontinued. 6. Leukocytosis-secondary to community-acquired pneumonia, Enterococcus UTI.  Will repeat CBC in a.m. 7. Hypertension-continue Catapres, Norvasc 8. Hyponatremia-resolved 9. Diabetes mellitus type 2-continue sliding scale insulin NovoLog, hemoglobin A1c is 5.86 10. ? History of rheumatoid arthritis-patient is on hydroxychloroquine 200 mg p.o. twice daily.     COVID-19 Labs  No results for input(s): DDIMER, FERRITIN, LDH, CRP in the last 72 hours.  Lab Results  Component Value Date   SARSCOV2NAA NEGATIVE 11/17/2020   SARSCOV2NAA NEGATIVE 11/11/2020   [place] NEGATIVE 10/29/2020   [place] NEGATIVE 07/06/2020     Scheduled medications:   . (feeding supplement) PROSource Plus  30 mL Oral BID WC  . amLODipine  10 mg Oral Daily  . cloNIDine  0.1 mg Oral BID  . DULoxetine  30 mg Oral TID  . enoxaparin (LOVENOX) injection  40 mg Subcutaneous Q24H  . feeding supplement (GLUCERNA SHAKE)  237 mL Oral TID BM  . gabapentin  300 mg Oral TID  . hydroxychloroquine  200 mg Oral BID  . insulin aspart  0-15 Units Subcutaneous TID WC  . insulin aspart  0-5 Units Subcutaneous QHS  . pantoprazole  40 mg Oral Daily  . rosuvastatin  40 mg Oral QPM  . tamsulosin  0.4 mg Oral QPC supper         CBG: Recent Labs  Lab 11/18/20 1158 11/18/20 1655 11/18/20 2121 11/19/20 0736 11/19/20 1134  GLUCAP 179* 156* 182* 127* 123*    SpO2: 99 %    CBC: Recent Labs  Lab 11/14/20 0522 11/15/20 0432 11/16/20 0415 11/17/20 0549 11/18/20 0446  WBC 16.7* 15.7* 10.9* 12.9* 18.0*  HGB 11.8* 12.2* 11.8* 12.9* 12.2*  HCT 34.7* 36.9* 35.7*  38.4* 36.5*  MCV 90.4 92.0 90.6 91.4 90.3  PLT 403* 417* 374 362 [id number]    Basic Metabolic Panel: Recent Labs  Lab 11/14/20 0522 11/15/20 0432 11/16/20 0415 11/17/20 0549 11/18/20 0446  NA 133* 134* 133* 135 131*  K  2.9* 3.3* 3.7 3.8 3.5  CL 99 102 103 102 100  CO2 22 21* 20* 21* 21*  GLUCOSE 168* 156* 182* 178* 146*  BUN 19 28* 36* 38* 37*  CREATININE 0.74 0.81 0.83 0.81 0.73  CALCIUM 9.5 9.7 9.8 9.8 9.3  MG  --   --  2.1  --   --      Liver Function Tests: No results for input(s): AST, ALT, ALKPHOS, BILITOT, PROT, ALBUMIN in the last 168 hours.   Antibiotics: Anti-infectives (From admission, onward)   Start     Dose/Rate Route Frequency Ordered Stop   11/14/20 1000  amoxicillin-clavulanate (AUGMENTIN) 875-125 MG per tablet 1 tablet  Status:  Discontinued        1 tablet Oral Every 12 hours 11/14/20 0722 11/18/20 1448   11/13/20 1800  azithromycin (ZITHROMAX) tablet 500 mg  Status:  Discontinued        500 mg Oral Daily-1800 11/13/20 1359 11/14/20 0722   11/12/20 1000  hydroxychloroquine (PLAQUENIL) tablet 200 mg        200 mg Oral 2 times daily 11/11/20 1630     11/11/20 1800  cefTRIAXone (ROCEPHIN) 2 g in sodium chloride 0.9 % 100 mL IVPB  Status:  Discontinued        2 g 200 mL/hr over 30 Minutes Intravenous Every 24 hours 11/11/20 1618 11/14/20 0722   11/11/20 1800  azithromycin (ZITHROMAX) 500 mg in sodium chloride 0.9 % 250 mL IVPB  Status:  Discontinued        500 mg 250 mL/hr over 60 Minutes Intravenous Every 24 hours 11/11/20 1618 11/13/20 1359   11/11/20 1645  cefTRIAXone (ROCEPHIN) 2 g in sodium chloride 0.9 % 100 mL IVPB  Status:  Discontinued        2 g 200 mL/hr over 30 Minutes Intravenous Every 24 hours 11/11/20 1630 11/11/20 1705   11/11/20 1645  azithromycin (ZITHROMAX) 500 mg in sodium chloride 0.9 % 250 mL IVPB  Status:  Discontinued        500 mg 250 mL/hr over 60 Minutes Intravenous Every 24 hours 11/11/20 1630 11/11/20 1705   11/11/20 1600  ciprofloxacin (CIPRO) IVPB 400 mg  Status:  Discontinued       "And" Linked Group Details   400 mg 200 mL/hr over 60 Minutes Intravenous  Once 11/11/20 1558 11/11/20 1617   11/11/20 1600  metroNIDAZOLE (FLAGYL) tablet 500 mg   Status:  Discontinued       "And" Linked Group Details   500 mg Oral Every 8 hours 11/11/20 1558 11/11/20 1617       DVT prophylaxis: Lovenox  Code Status:  DNR  Family Communication: No family at bedside   Consultants:    Procedures:      Objective   Vitals:   11/19/20 0636 11/19/20 0707 11/19/20 0707 11/19/20 1130  BP: (!) 158/91 (!) 142/79 (!) 142/79 (!) 161/91  Pulse: 85 100 99 88  Resp: 16 18 18 18   Temp: 98.1 F (36.7 C) 98.6 F (37 C) 98.6 F (37 C)   TempSrc: Oral Oral Oral   SpO2: 98% 99% 99% 99%  Weight:      Height:        Intake/Output  Summary (Last 24 hours) at 11/19/2020 1420 Last data filed at 11/19/2020 1100 Gross per 24 hour  Intake 610 ml  Output 1000 ml  Net -390 ml    12/28 1901 - 12/30 0700 In: [id number] [P.O.:1390] Out: 1800 [id number]  Filed Weights   11/16/20 0502 11/17/20 0500 11/18/20 0515  Weight: 107.1 kg 111.7 kg 111.5 kg    Physical Examination:   General-appears in no acute distress Heart-S1-S2, regular, no murmur auscultated Lungs-clear to auscultation bilaterally, no wheezing or crackles auscultated Abdomen-soft, nontender, no organomegaly Extremities-no edema in the lower extremities Neuro-alert, oriented x3, no focal deficit noted  Status is: Inpatient  Dispo: The patient is from: Home              Anticipated d/c is to: Skilled nursing facility              Anticipated d/c date is: 11/20/2020              Patient currently is medically stable for discharge  Barrier to discharge-awaiting bed at skilled nursing facility       Data Reviewed:   Recent Results (from the past 240 hour(s))  Blood culture (routine single)     Status: None   Collection Time: 11/11/20  1:30 PM   Specimen: BLOOD  Result Value Ref Range Status   Specimen Description   Final    BLOOD LEFT ANTECUBITAL Performed at [facility] [address], [place], [place] [address]    Special Requests   Final     BOTTLES DRAWN AEROBIC AND ANAEROBIC Blood Culture adequate volume Performed at [facility] [address], [place], [place] [address]    Culture   Final    NO GROWTH 5 DAYS Performed at [facility], [place] [address], [place], [place] [address]    Report Status 11/16/2020 FINAL  Final  Resp Panel by RT-PCR (Flu A&B, Covid) Nasopharyngeal Swab     Status: None   Collection Time: 11/11/20  2:14 PM   Specimen: Nasopharyngeal Swab; Nasopharyngeal(NP) swabs in vial transport medium  Result Value Ref Range Status   SARS Coronavirus 2 by RT PCR NEGATIVE NEGATIVE Final    Comment: (NOTE) SARS-CoV-2 target nucleic acids are NOT DETECTED.  The SARS-CoV-2 RNA is generally detectable in upper respiratory specimens during the acute phase of infection. The lowest concentration of SARS-CoV-2 viral copies this assay can detect is 138 copies/mL. A negative result does not preclude SARS-Cov-2 infection and should not be used as the sole basis for treatment or other patient management decisions. A negative result may occur with  improper specimen collection/handling, submission of specimen other than nasopharyngeal swab, presence of viral mutation(s) within the areas targeted by this assay, and inadequate number of viral copies(<138 copies/mL). A negative result must be combined with clinical observations, patient history, and epidemiological information. The expected result is Negative.  Fact Sheet for Patients:  EntrepreneurPulse.com.au  Fact Sheet for Healthcare Providers:  IncredibleEmployment.be  This test is no t yet approved or cleared by the [place] FDA and  has been authorized for detection and/or diagnosis of SARS-CoV-2 by FDA under an Emergency Use Authorization (EUA). This EUA will remain  in effect (meaning this test can be used) for the duration of the COVID-19 declaration under Section 564(b)(1) of the Act,  21 U.S.C.section 360bbb-3(b)(1), unless the authorization is terminated  or revoked sooner.       Influenza A by PCR NEGATIVE NEGATIVE Final  Influenza B by PCR NEGATIVE NEGATIVE Final    Comment: (NOTE) The Xpert Xpress SARS-CoV-2/FLU/RSV plus assay is intended as an aid in the diagnosis of influenza from Nasopharyngeal swab specimens and should not be used as a sole basis for treatment. Nasal washings and aspirates are unacceptable for Xpert Xpress SARS-CoV-2/FLU/RSV testing.  Fact Sheet for Patients: EntrepreneurPulse.com.au  Fact Sheet for Healthcare Providers: IncredibleEmployment.be  This test is not yet approved or cleared by the [place] FDA and has been authorized for detection and/or diagnosis of SARS-CoV-2 by FDA under an Emergency Use Authorization (EUA). This EUA will remain in effect (meaning this test can be used) for the duration of the COVID-19 declaration under Section 564(b)(1) of the Act, 21 U.S.C. section 360bbb-3(b)(1), unless the authorization is terminated or revoked.  Performed at [facility], [place] [address] [place], [place] [address]   Urine culture     Status: Abnormal   Collection Time: 11/11/20  4:00 PM   Specimen: In/Out Cath Urine  Result Value Ref Range Status   Specimen Description   Final    IN/OUT CATH URINE Performed at [facility] [address] [place], [place] [address]    Special Requests   Final    NONE Performed at [facility], [place] [address] [place], [place] [address]    Culture >=100,000 COLONIES/mL ENTEROCOCCUS FAECALIS (A)  Final   Report Status 11/14/2020 FINAL  Final   Organism ID, Bacteria ENTEROCOCCUS FAECALIS (A)  Final      Susceptibility   Enterococcus faecalis - MIC*    AMPICILLIN <=2 SENSITIVE Sensitive     NITROFURANTOIN <=16 SENSITIVE Sensitive     VANCOMYCIN 1 SENSITIVE Sensitive     * >=100,000  COLONIES/mL ENTEROCOCCUS FAECALIS  Gastrointestinal Panel by PCR , Stool     Status: None   Collection Time: 11/12/20  6:41 AM   Specimen: STOOL  Result Value Ref Range Status   Campylobacter species NOT DETECTED NOT DETECTED Final   Plesimonas shigelloides NOT DETECTED NOT DETECTED Final   Salmonella species NOT DETECTED NOT DETECTED Final   Yersinia enterocolitica NOT DETECTED NOT DETECTED Final   Vibrio species NOT DETECTED NOT DETECTED Final   Vibrio cholerae NOT DETECTED NOT DETECTED Final   Enteroaggregative E coli (EAEC) NOT DETECTED NOT DETECTED Final   Enteropathogenic E coli (EPEC) NOT DETECTED NOT DETECTED Final   Enterotoxigenic E coli (ETEC) NOT DETECTED NOT DETECTED Final   Shiga like toxin producing E coli (STEC) NOT DETECTED NOT DETECTED Final   Shigella/Enteroinvasive E coli (EIEC) NOT DETECTED NOT DETECTED Final   Cryptosporidium NOT DETECTED NOT DETECTED Final   Cyclospora cayetanensis NOT DETECTED NOT DETECTED Final   Entamoeba histolytica NOT DETECTED NOT DETECTED Final   Giardia lamblia NOT DETECTED NOT DETECTED Final   Adenovirus F40/41 NOT DETECTED NOT DETECTED Final   Astrovirus NOT DETECTED NOT DETECTED Final   Norovirus GI/GII NOT DETECTED NOT DETECTED Final   Rotavirus A NOT DETECTED NOT DETECTED Final   Sapovirus (I, II, IV, and V) NOT DETECTED NOT DETECTED Final    Comment: Performed at [facility], [place] [place], [place] [address]  C Difficile Quick Screen (NO PCR Reflex)     Status: Abnormal   Collection Time: 11/12/20  6:42 AM   Specimen: STOOL  Result Value Ref Range Status   C Diff antigen POSITIVE (A) NEGATIVE Final   C Diff toxin NEGATIVE NEGATIVE Final   C Diff interpretation  Final    Results are indeterminate. Please contact the provider listed for your campus for C diff questions in AMION.    Comment: Performed at [facility], [address], [place], [place] [address]  SARS CORONAVIRUS 2 (TAT 6-24  HRS) Nasopharyngeal Nasopharyngeal Swab     Status: None   Collection Time: 11/17/20 10:07 AM   Specimen: Nasopharyngeal Swab  Result Value Ref Range Status   SARS Coronavirus 2 NEGATIVE NEGATIVE Final    Comment: (NOTE) SARS-CoV-2 target nucleic acids are NOT DETECTED.  The SARS-CoV-2 RNA is generally detectable in upper and lower respiratory specimens during the acute phase of infection. Negative results do not preclude SARS-CoV-2 infection, do not rule out co-infections with other pathogens, and should not be used as the sole basis for treatment or other patient management decisions. Negative results must be combined with clinical observations, patient history, and epidemiological information. The expected result is Negative.  Fact Sheet for Patients: HairSlick.no  Fact Sheet for Healthcare Providers: quierodirigir.com  This test is not yet approved or cleared by the [place] FDA and  has been authorized for detection and/or diagnosis of SARS-CoV-2 by FDA under an Emergency Use Authorization (EUA). This EUA will remain  in effect (meaning this test can be used) for the duration of the COVID-19 declaration under Se ction 564(b)(1) of the Act, 21 U.S.C. section 360bbb-3(b)(1), unless the authorization is terminated or revoked sooner.  Performed at [facility], [address] [place], [place] [address]          Meredeth Ide   [facility] If 7PM-7AM, please contact night-coverage at [email or web address], Office  [phone]   11/19/2020, 2:20 PM  LOS: 8 days

## 2020-11-19 NOTE — Progress Notes (Signed)
Physical Therapy Treatment Patient Details Name: Jorge Sherman MRN: [id number] DOB: [birth year] Today's Date: 11/19/2020    History of Present Illness Jorge Sherman is a 79 y.o. male with medical history significant of diet-controlled diabetes mellitus type 2, hypertension, chronic pain, OSA noncompliant with CPAP, hyperlipidemia, BPH, recent admission from 10/29/2020-11/03/2020 with fall and chest wall/flank pain along with leukocytosis, dehydration and hyponatremia treated with IV fluids and subsequent discharge home despite PT/OT recommending SNF presented with multiple episodes of vomiting.  Patient states that he has been having nausea and multiple episodes of nonbloody nonbilious vomiting since last night along with multiple episodes of watery diarrhea and midabdominal pain. He was found to have Gastroenteritis and CAP    PT Comments    Pt assisted to sitting EOB and then utilized stedy to assist with transfer to recliner.  Pt reports increased bil shoulder pain.  Pt also reported chest pain 8/10 with pain shooting down left arm after transfer however reports chest pain last night and this morning as well (RN immediately notified).  Vitals upon sitting in recliner: 176/95 mmHg, 88 bpm, SpO2 100% room air.  Continue to recommend d/c to SNF.   Follow Up Recommendations  SNF     Equipment Recommendations  None recommended by PT    Recommendations for Other Services       Precautions / Restrictions Precautions Precautions: Fall    Mobility  Bed Mobility Overal bed mobility: Needs Assistance Bed Mobility: Supine to Sit     Supine to sit: Mod assist;HOB elevated     General bed mobility comments: pt requiring assist for upper body, provided encouragement to try to self assist prior to assisting pt; limited by bil shoulder pain and weakness  Transfers Overall transfer level: Needs assistance   Transfers: Sit to/from Stand Sit to Stand: Mod assist;From elevated surface;+2  physical assistance         General transfer comment: verbal cues for weight shifting, pt very anxious with mobility and reporting increased bil shoulder pain, transferred to recliner with stedy  Ambulation/Gait                 Stairs             Wheelchair Mobility    Modified Rankin (Stroke Patients Only)       Balance                                            Cognition Arousal/Alertness: Awake/alert Behavior During Therapy: Anxious Overall Cognitive Status: Within Functional Limits for tasks assessed                                        Exercises      General Comments        Pertinent Vitals/Pain Pain Assessment: Faces Faces Pain Scale: Hurts whole lot Pain Location: B shoulders with use of stedy, R > L Pain Descriptors / Indicators: Discomfort;Grimacing;Aching;Moaning Pain Intervention(s): Repositioned;Monitored during session;Other (comment) (RN applied voltaren gel prior to transfer)    Home Living                      Prior Function            PT Goals (current goals can now be found  in the care plan section) Acute Rehab PT Goals Patient Stated Goal: to get stronger and return to baseline PT Goal Formulation: With patient Time For Goal Achievement: 12/03/20 Potential to Achieve Goals: Fair Progress towards PT goals: Progressing toward goals    Frequency    Min 3X/week      PT Plan Current plan remains appropriate    Co-evaluation              AM-PAC PT "6 Clicks" Mobility   Outcome Measure  Help needed turning from your back to your side while in a flat bed without using bedrails?: A Lot Help needed moving from lying on your back to sitting on the side of a flat bed without using bedrails?: A Lot Help needed moving to and from a bed to a chair (including a wheelchair)?: A Lot Help needed standing up from a chair using your arms (Sherman.g., wheelchair or bedside chair)?: A  Lot Help needed to walk in hospital room?: Total Help needed climbing 3-5 steps with a railing? : Total 6 Click Score: 10    End of Session Equipment Utilized During Treatment: Gait belt Activity Tolerance: Patient limited by fatigue Patient left: in chair;with call bell/phone within reach;with chair alarm set Nurse Communication: Mobility status PT Visit Diagnosis: Muscle weakness (generalized) (M62.81);Other abnormalities of gait and mobility (R26.89)     Time: 0092-3300 PT Time Calculation (min) (ACUTE ONLY): 21 min  Charges:  $Therapeutic Activity: 8-22 mins                     Thomasene Mohair PT, DPT Acute Rehabilitation Services Pager: [phone] Office: [phone]   Jorge Sherman,Jorge Sherman 11/19/2020, 1:01 PM

## 2020-11-19 NOTE — Progress Notes (Signed)
Nutrition Follow-up  DOCUMENTATION CODES:   Obesity unspecified  INTERVENTION:   -Glucerna Shake po TID, each supplement provides 220 kcal and 10 grams of protein  -Prosource Plus PO BID, each provides 100 kcals and 15g protein  -D/c Boost Breeze  NUTRITION DIAGNOSIS:   Inadequate oral intake related to nausea,vomiting,diarrhea as evidenced by per patient/family report.  Ongoing.  GOAL:   Patient will meet greater than or equal to 90% of their needs  Progressing.   MONITOR:   PO intake,Supplement acceptance,Diet advancement,Labs,Weight trends,I & O's  ASSESSMENT:   79 y.o. male with medical history significant of diet-controlled diabetes mellitus type 2, hypertension, chronic pain, OSA noncompliant with CPAP, hyperlipidemia, BPH, recent admission from 10/29/2020-11/03/2020 with fall and chest wall/flank pain along with leukocytosis, dehydration and hyponatremia treated with IV fluids and subsequent discharge home despite PT/OT recommending SNF presented with multiple episodes of vomiting.  Patient states that he has been having nausea and multiple episodes of nonbloody nonbilious vomiting since last night along with multiple episodes of watery diarrhea and midabdominal pain.  Patient currently consuming 0-70% of meals at this time. Pt accepting Prosource and Boost Breeze supplements. Now that pt on a soft diet will change to Glucerna shakes.  Admission weight: 254 lbs Current weight: 245 lbs  Medications reviewed.  Labs reviewed: CBGs: 123-182  Diet Order:   Diet Order            DIET SOFT Room service appropriate? Yes; Fluid consistency: Thin  Diet effective now                 EDUCATION NEEDS:   No education needs have been identified at this time  Skin:  Skin Assessment: Reviewed RN Assessment  Last BM:  12/30 -type 6  Height:   Ht Readings from Last 1 Encounters:  11/11/20 5\' 11"  (1.803 m)    Weight:   Wt Readings from Last 1 Encounters:   11/18/20 111.5 kg    BMI:  Body mass index is 34.28 kg/m.  Estimated Nutritional Needs:   Kcal:  2100-2300  Protein:  90-100g  Fluid:  2.1L/day  11/20/20, MS, RD, LDN Inpatient Clinical Dietitian Contact information available via Amion

## 2020-11-20 DIAGNOSIS — N390 Urinary tract infection, site not specified: Secondary | ICD-10-CM | POA: Diagnosis not present

## 2020-11-20 DIAGNOSIS — I10 Essential (primary) hypertension: Secondary | ICD-10-CM | POA: Diagnosis not present

## 2020-11-20 DIAGNOSIS — G894 Chronic pain syndrome: Secondary | ICD-10-CM | POA: Diagnosis not present

## 2020-11-20 DIAGNOSIS — D72829 Elevated white blood cell count, unspecified: Secondary | ICD-10-CM | POA: Diagnosis not present

## 2020-11-20 LAB — CBC
HCT: 35.9 % — ABNORMAL LOW (ref 39.0–52.0)
Hemoglobin: 11.8 g/dL — ABNORMAL LOW (ref 13.0–17.0)
MCH: 30.3 pg (ref 26.0–34.0)
MCHC: 32.9 g/dL (ref 30.0–36.0)
MCV: 92.1 fL (ref 80.0–100.0)
Platelets: 285 10*3/uL (ref 150–400)
RBC: 3.9 MIL/uL — ABNORMAL LOW (ref 4.22–5.81)
RDW: 13.6 % (ref 11.5–15.5)
WBC: 14.3 10*3/uL — ABNORMAL HIGH (ref 4.0–10.5)
nRBC: 0 % (ref 0.0–0.2)

## 2020-11-20 LAB — GLUCOSE, CAPILLARY
Glucose-Capillary: 242 mg/dL — ABNORMAL HIGH (ref 70–99)
Glucose-Capillary: 214 mg/dL — ABNORMAL HIGH (ref 70–99)
Glucose-Capillary: 192 mg/dL — ABNORMAL HIGH (ref 70–99)

## 2020-11-20 MED ORDER — SUMATRIPTAN SUCCINATE 25 MG PO TABS
25.0000 mg | ORAL_TABLET | Freq: Once | ORAL | Status: AC
  Administered 2020-11-20: 25 mg via ORAL
  Filled 2020-11-20: qty 1

## 2020-11-20 NOTE — Progress Notes (Signed)
[facility]  PROGRESS NOTE  Jorge Sherman [id number] DOB: October 12, 1941 DOA: 11/11/2020 PCP: Merrilee Seashore, MD   Brief HPI:   79 year old male with medical history of diet-controlled diabetes mellitus type 2, hypertension, chronic pain syndrome, OSA noncompliant with CPAP, hyperlipidemia, BPH, recent admission from 10/29/2020 to 11/03/2020 with fall and chest wall/flank pain along with leukocytosis, dehydration, hyponatremia treated with IV fluids and subsequent discharge home despite PT OT recommending skilled nursing facility presenting with multiple episodes of vomiting.  Patient states he has been having nausea and vomiting along with mid abdominal pain.    Subjective   Patient seen and examined, complains of migraine headache this morning.   Assessment/Plan:     1. Gastroenteritis-patient presented with nausea vomiting/diarrhea, resolved.  CT abdomen pelvis showed no acute inflammatory changes, showed no stool throughout colon.  C. difficile was positive for antigen, negative for toxin, no treatment indicated.  GI pathogen panel negative.  Patient symptoms have resolved.   2. Headache-patient has history of migraine, will give sumatriptan 25 mg p.o. x1. 3. Chronic pain syndrome-patient has chronic back pain and is on multiple medications.  Continue gabapentin, duloxetine, tramadol, oxycodone, hydroxychloroquine, Robaxin.  We will continue these medications at this time. 4. Chest pain-likely musculoskeletal started after patient was sitting in a chair from bed.  EKG is unremarkable, will obtain troponin x2, continue above medications. 5. Community-acquired pneumonia-chest x-ray showed ill-defined opacity in left base consistent with developing pneumonia, patient is currently on Augmentin.  Patient has received 8 days of antibiotic treatment in the hospital.  Will discontinue antibiotics at this time. 6. Enterococcus UTI-patient has been treated with Augmentin for 5 days,  already received ceftriaxone for 3 days.  Antibiotics have been discontinued. 7. Leukocytosis-secondary to community-acquired pneumonia, Enterococcus UTI.  Will repeat CBC in a.m. 8. Hypertension-continue Catapres, Norvasc 9. Hyponatremia-resolved 10. Diabetes mellitus type 2-continue sliding scale insulin NovoLog, hemoglobin A1c is 5.86 11. ? History of rheumatoid arthritis-patient is on hydroxychloroquine 200 mg p.o. twice daily.     COVID-19 Labs  No results for input(s): DDIMER, FERRITIN, LDH, CRP in the last 72 hours.  Lab Results  Component Value Date   SARSCOV2NAA NEGATIVE 11/17/2020   SARSCOV2NAA NEGATIVE 11/11/2020   [place] NEGATIVE 10/29/2020   [place] NEGATIVE 07/06/2020     Scheduled medications:   . (feeding supplement) PROSource Plus  30 mL Oral BID WC  . amLODipine  10 mg Oral Daily  . cloNIDine  0.1 mg Oral BID  . DULoxetine  30 mg Oral TID  . enoxaparin (LOVENOX) injection  40 mg Subcutaneous Q24H  . feeding supplement (GLUCERNA SHAKE)  237 mL Oral TID BM  . gabapentin  300 mg Oral TID  . hydroxychloroquine  200 mg Oral BID  . insulin aspart  0-15 Units Subcutaneous TID WC  . insulin aspart  0-5 Units Subcutaneous QHS  . pantoprazole  40 mg Oral Daily  . rosuvastatin  40 mg Oral QPM  . tamsulosin  0.4 mg Oral QPC supper         CBG: Recent Labs  Lab 11/18/20 2121 11/19/20 0736 11/19/20 1134 11/19/20 1607 11/20/20 1151  GLUCAP 182* 127* 123* 121* 242*    SpO2: 99 %    CBC: Recent Labs  Lab 11/15/20 0432 11/16/20 0415 11/17/20 0549 11/18/20 0446 11/20/20 0503  WBC 15.7* 10.9* 12.9* 18.0* 14.3*  HGB 12.2* 11.8* 12.9* 12.2* 11.8*  HCT 36.9* 35.7* 38.4* 36.5* 35.9*  MCV 92.0 90.6 91.4 90.3 92.1  PLT 417* 374  362 356 [id number]    Basic Metabolic Panel: Recent Labs  Lab 11/14/20 0522 11/15/20 0432 11/16/20 0415 11/17/20 0549 11/18/20 0446  NA 133* 134* 133* 135 131*  K 2.9* 3.3* 3.7 3.8 3.5  CL 99 102 103 102 100  CO2 22  21* 20* 21* 21*  GLUCOSE 168* 156* 182* 178* 146*  BUN 19 28* 36* 38* 37*  CREATININE 0.74 0.81 0.83 0.81 0.73  CALCIUM 9.5 9.7 9.8 9.8 9.3  MG  --   --  2.1  --   --      Liver Function Tests: No results for input(s): AST, ALT, ALKPHOS, BILITOT, PROT, ALBUMIN in the last 168 hours.   Antibiotics: Anti-infectives (From admission, onward)   Start     Dose/Rate Route Frequency Ordered Stop   11/14/20 1000  amoxicillin-clavulanate (AUGMENTIN) 875-125 MG per tablet 1 tablet  Status:  Discontinued        1 tablet Oral Every 12 hours 11/14/20 0722 11/18/20 1448   11/13/20 1800  azithromycin (ZITHROMAX) tablet 500 mg  Status:  Discontinued        500 mg Oral Daily-1800 11/13/20 1359 11/14/20 0722   11/12/20 1000  hydroxychloroquine (PLAQUENIL) tablet 200 mg        200 mg Oral 2 times daily 11/11/20 1630     11/11/20 1800  cefTRIAXone (ROCEPHIN) 2 g in sodium chloride 0.9 % 100 mL IVPB  Status:  Discontinued        2 g 200 mL/hr over 30 Minutes Intravenous Every 24 hours 11/11/20 1618 11/14/20 0722   11/11/20 1800  azithromycin (ZITHROMAX) 500 mg in sodium chloride 0.9 % 250 mL IVPB  Status:  Discontinued        500 mg 250 mL/hr over 60 Minutes Intravenous Every 24 hours 11/11/20 1618 11/13/20 1359   11/11/20 1645  cefTRIAXone (ROCEPHIN) 2 g in sodium chloride 0.9 % 100 mL IVPB  Status:  Discontinued        2 g 200 mL/hr over 30 Minutes Intravenous Every 24 hours 11/11/20 1630 11/11/20 1705   11/11/20 1645  azithromycin (ZITHROMAX) 500 mg in sodium chloride 0.9 % 250 mL IVPB  Status:  Discontinued        500 mg 250 mL/hr over 60 Minutes Intravenous Every 24 hours 11/11/20 1630 11/11/20 1705   11/11/20 1600  ciprofloxacin (CIPRO) IVPB 400 mg  Status:  Discontinued       "And" Linked Group Details   400 mg 200 mL/hr over 60 Minutes Intravenous  Once 11/11/20 1558 11/11/20 1617   11/11/20 1600  metroNIDAZOLE (FLAGYL) tablet 500 mg  Status:  Discontinued       "And" Linked Group Details    500 mg Oral Every 8 hours 11/11/20 1558 11/11/20 1617       DVT prophylaxis: Lovenox  Code Status:  DNR  Family Communication: No family at bedside   Consultants:    Procedures:      Objective   Vitals:   11/19/20 2144 11/20/20 0439 11/20/20 0547 11/20/20 1346  BP: (!) 153/84  (!) 159/81 134/87  Pulse: 89  85 91  Resp: 16  15 18   Temp: 97.9 F (36.6 C)  98.2 F (36.8 C) 98.8 F (37.1 C)  TempSrc:      SpO2: 97% 97% 97% 99%  Weight:      Height:        Intake/Output Summary (Last 24 hours) at 11/20/2020 1353 Last data filed at 11/20/2020 1348 Gross per  24 hour  Intake 720 ml  Output 1200 ml  Net -480 ml    12/29 1901 - 12/31 0700 In: 960 [P.O.:960] Out: 1200 [Urine:1200]  Filed Weights   11/16/20 0502 11/17/20 0500 11/18/20 0515  Weight: 107.1 kg 111.7 kg 111.5 kg    Physical Examination:   General-appears in no acute distress Heart-S1-S2, regular, no murmur auscultated Lungs-clear to auscultation bilaterally, no wheezing or crackles auscultated Abdomen-soft, nontender, no organomegaly Extremities-no edema in the lower extremities Neuro-alert, oriented x3, no focal deficit noted  Status is: Inpatient  Dispo: The patient is from: Home              Anticipated d/c is to: Skilled nursing facility              Anticipated d/c date is: 11/23/2020              Patient currently is medically stable for discharge  Barrier to discharge-awaiting bed at skilled nursing facility       Data Reviewed:   Recent Results (from the past 240 hour(s))  Blood culture (routine single)     Status: None   Collection Time: 11/11/20  1:30 PM   Specimen: BLOOD  Result Value Ref Range Status   Specimen Description   Final    BLOOD LEFT ANTECUBITAL Performed at [facility] [address] [place], [place] [address]    Special Requests   Final    BOTTLES DRAWN AEROBIC AND ANAEROBIC Blood Culture adequate volume Performed at [facility] [address], [place], [place] [address]    Culture   Final    NO GROWTH 5 DAYS Performed at [facility], [place] [address] [place], [place] [address]    Report Status 11/16/2020 FINAL  Final  Resp Panel by RT-PCR (Flu A&B, Covid) Nasopharyngeal Swab     Status: None   Collection Time: 11/11/20  2:14 PM   Specimen: Nasopharyngeal Swab; Nasopharyngeal(NP) swabs in vial transport medium  Result Value Ref Range Status   SARS Coronavirus 2 by RT PCR NEGATIVE NEGATIVE Final    Comment: (NOTE) SARS-CoV-2 target nucleic acids are NOT DETECTED.  The SARS-CoV-2 RNA is generally detectable in upper respiratory specimens during the acute phase of infection. The lowest concentration of SARS-CoV-2 viral copies this assay can detect is 138 copies/mL. A negative result does not preclude SARS-Cov-2 infection and should not be used as the sole basis for treatment or other patient management decisions. A negative result may occur with  improper specimen collection/handling, submission of specimen other than nasopharyngeal swab, presence of viral mutation(s) within the areas targeted by this assay, and inadequate number of viral copies(<138 copies/mL). A negative result must be combined with clinical observations, patient history, and epidemiological information. The expected result is Negative.  Fact Sheet for Patients:  EntrepreneurPulse.com.au  Fact Sheet for Healthcare Providers:  IncredibleEmployment.be  This test is no t yet approved or cleared by the [place] FDA and  has been authorized for detection and/or diagnosis of SARS-CoV-2 by FDA under an Emergency Use Authorization (EUA). This EUA will remain  in effect (meaning this test can be used) for the duration of the COVID-19 declaration under Section 564(b)(1) of the Act, 21 U.S.C.section 360bbb-3(b)(1), unless the authorization is terminated  or revoked sooner.        Influenza A by PCR NEGATIVE NEGATIVE Final   Influenza B by PCR NEGATIVE NEGATIVE Final    Comment: (NOTE) The Xpert  Xpress SARS-CoV-2/FLU/RSV plus assay is intended as an aid in the diagnosis of influenza from Nasopharyngeal swab specimens and should not be used as a sole basis for treatment. Nasal washings and aspirates are unacceptable for Xpert Xpress SARS-CoV-2/FLU/RSV testing.  Fact Sheet for Patients: BloggerCourse.com  Fact Sheet for Healthcare Providers: SeriousBroker.it  This test is not yet approved or cleared by the [place] FDA and has been authorized for detection and/or diagnosis of SARS-CoV-2 by FDA under an Emergency Use Authorization (EUA). This EUA will remain in effect (meaning this test can be used) for the duration of the COVID-19 declaration under Section 564(b)(1) of the Act, 21 U.S.C. section 360bbb-3(b)(1), unless the authorization is terminated or revoked.  Performed at [facility], [address] [place], [place] [address]   Urine culture     Status: Abnormal   Collection Time: 11/11/20  4:00 PM   Specimen: In/Out Cath Urine  Result Value Ref Range Status   Specimen Description   Final    IN/OUT CATH URINE Performed at [facility], [address] [place], [place] [address]    Special Requests   Final    NONE Performed at [facility], [address], [place], [place] [address]    Culture >=100,000 COLONIES/mL ENTEROCOCCUS FAECALIS (A)  Final   Report Status 11/14/2020 FINAL  Final   Organism ID, Bacteria ENTEROCOCCUS FAECALIS (A)  Final      Susceptibility   Enterococcus faecalis - MIC*    AMPICILLIN <=2 SENSITIVE Sensitive     NITROFURANTOIN <=16 SENSITIVE Sensitive     VANCOMYCIN 1 SENSITIVE Sensitive     * >=100,000 COLONIES/mL ENTEROCOCCUS FAECALIS  Gastrointestinal Panel by PCR , Stool     Status: None   Collection Time:  11/12/20  6:41 AM   Specimen: STOOL  Result Value Ref Range Status   Campylobacter species NOT DETECTED NOT DETECTED Final   Plesimonas shigelloides NOT DETECTED NOT DETECTED Final   Salmonella species NOT DETECTED NOT DETECTED Final   Yersinia enterocolitica NOT DETECTED NOT DETECTED Final   Vibrio species NOT DETECTED NOT DETECTED Final   Vibrio cholerae NOT DETECTED NOT DETECTED Final   Enteroaggregative E coli (EAEC) NOT DETECTED NOT DETECTED Final   Enteropathogenic E coli (EPEC) NOT DETECTED NOT DETECTED Final   Enterotoxigenic E coli (ETEC) NOT DETECTED NOT DETECTED Final   Shiga like toxin producing E coli (STEC) NOT DETECTED NOT DETECTED Final   Shigella/Enteroinvasive E coli (EIEC) NOT DETECTED NOT DETECTED Final   Cryptosporidium NOT DETECTED NOT DETECTED Final   Cyclospora cayetanensis NOT DETECTED NOT DETECTED Final   Entamoeba histolytica NOT DETECTED NOT DETECTED Final   Giardia lamblia NOT DETECTED NOT DETECTED Final   Adenovirus F40/41 NOT DETECTED NOT DETECTED Final   Astrovirus NOT DETECTED NOT DETECTED Final   Norovirus GI/GII NOT DETECTED NOT DETECTED Final   Rotavirus A NOT DETECTED NOT DETECTED Final   Sapovirus (I, II, IV, and V) NOT DETECTED NOT DETECTED Final    Comment: Performed at [facility], [address] [place], [place] [address]  C Difficile Quick Screen (NO PCR Reflex)     Status: Abnormal   Collection Time: 11/12/20  6:42 AM   Specimen: STOOL  Result Value Ref Range Status   C Diff antigen POSITIVE (A) NEGATIVE Final   C Diff toxin NEGATIVE NEGATIVE Final   C Diff interpretation   Final    Results are indeterminate. Please contact the provider listed  for your campus for C diff questions in [place].    Comment: Performed at [facility], [place] [address] [place], [place] [address]  SARS CORONAVIRUS 2 (TAT 6-24 HRS) Nasopharyngeal Nasopharyngeal Swab     Status: None   Collection Time: 11/17/20 10:07 AM   Specimen:  Nasopharyngeal Swab  Result Value Ref Range Status   SARS Coronavirus 2 NEGATIVE NEGATIVE Final    Comment: (NOTE) SARS-CoV-2 target nucleic acids are NOT DETECTED.  The SARS-CoV-2 RNA is generally detectable in upper and lower respiratory specimens during the acute phase of infection. Negative results do not preclude SARS-CoV-2 infection, do not rule out co-infections with other pathogens, and should not be used as the sole basis for treatment or other patient management decisions. Negative results must be combined with clinical observations, patient history, and epidemiological information. The expected result is Negative.  Fact Sheet for Patients: SugarRoll.be  Fact Sheet for Healthcare Providers: https://www.woods-mathews.com/  This test is not yet approved or cleared by the [place] FDA and  has been authorized for detection and/or diagnosis of SARS-CoV-2 by FDA under an Emergency Use Authorization (EUA). This EUA will remain  in effect (meaning this test can be used) for the duration of the COVID-19 declaration under Se ction 564(b)(1) of the Act, 21 U.S.C. section 360bbb-3(b)(1), unless the authorization is terminated or revoked sooner.  Performed at [facility], [place] [address] [place], [place] [address]          [facility] If 7PM-7AM, please contact night-coverage at [email or web address], Office  [phone]   11/20/2020, 1:53 PM  LOS: 9 days

## 2020-11-20 NOTE — TOC Progression Note (Signed)
Transition of Care [facility]) - Progression Note    Patient Details  Name: Jorge Sherman MRN: [id number] Date of Birth: 1941/04/19  [facility] CM/SW Contact  Amada Jupiter, LCSW Phone Number: 11/20/2020, 11:29 AM  Clinical Narrative:    Spoke yesterday with pt and daughter to review SNF bed offers. Both very upset that there are no offers from SNFs in [place] area and requesting [facility] pursue this further.  MD notes yesterday that pt was not medically ready for dc.  As requested, I have faxed pt's information to all SNFs in [facility] and await responses.  Explained to pt and daughter that, if pt is medically cleared for dc, we will have to choose from facilities that have made bed offers.  Have also encouraged daughter to contact facilities herself, however, she reports she does not have the available time to do this.    I have called all [facility] facilities again today and left messages for each admissions coordinator to please follow up if they can make a bed offer.     Expected Discharge Plan: Skilled Nursing Facility Barriers to Discharge: Insurance Authorization,Other (comment) (covid test)  Expected Discharge Plan and Services Expected Discharge Plan: Skilled Nursing Facility In-house Referral: NA Discharge Planning Services: NA Post Acute Care Choice: Skilled Nursing Facility Living arrangements for the past 2 months: Apartment                 DME Arranged: N/A DME Agency: NA       HH Arranged: NA HH Agency: NA         Social Determinants of Health (SDOH) Interventions    Readmission Risk Interventions Readmission Risk Prevention Plan 11/17/2020 06/29/2020  Transportation Screening Complete Complete  Home Care Screening - Complete  Medication Review (RN CM) - Complete  Medication Review Oceanographer) Complete -  PCP or Specialist appointment within 3-5 days of discharge Complete -  SW Recovery Care/Counseling Consult Complete -  Skilled Nursing  Facility Complete -  Some recent data might be hidden

## 2020-11-21 DIAGNOSIS — I10 Essential (primary) hypertension: Secondary | ICD-10-CM | POA: Diagnosis not present

## 2020-11-21 DIAGNOSIS — N390 Urinary tract infection, site not specified: Secondary | ICD-10-CM | POA: Diagnosis not present

## 2020-11-21 DIAGNOSIS — D72829 Elevated white blood cell count, unspecified: Secondary | ICD-10-CM | POA: Diagnosis not present

## 2020-11-21 DIAGNOSIS — G894 Chronic pain syndrome: Secondary | ICD-10-CM | POA: Diagnosis not present

## 2020-11-21 LAB — GLUCOSE, CAPILLARY
Glucose-Capillary: 168 mg/dL — ABNORMAL HIGH (ref 70–99)
Glucose-Capillary: 212 mg/dL — ABNORMAL HIGH (ref 70–99)
Glucose-Capillary: 170 mg/dL — ABNORMAL HIGH (ref 70–99)

## 2020-11-21 MED ORDER — KETOROLAC TROMETHAMINE 30 MG/ML IJ SOLN
30.0000 mg | Freq: Once | INTRAMUSCULAR | Status: AC
  Administered 2020-11-21: 30 mg via INTRAVENOUS
  Filled 2020-11-21: qty 1

## 2020-11-21 MED ORDER — METHYLPREDNISOLONE ACETATE 40 MG/ML IJ SUSP
60.0000 mg | Freq: Once | INTRAMUSCULAR | Status: AC
  Administered 2020-11-21: 60 mg via INTRAMUSCULAR
  Filled 2020-11-21: qty 2

## 2020-11-21 MED ORDER — SUMATRIPTAN SUCCINATE 25 MG PO TABS
25.0000 mg | ORAL_TABLET | ORAL | Status: AC | PRN
Start: 2020-11-21 — End: 2020-11-23
  Administered 2020-11-21 – 2020-11-23 (×2): 25 mg via ORAL
  Filled 2020-11-21 (×5): qty 1

## 2020-11-21 NOTE — Progress Notes (Signed)
**Note Jorge-Identified via Obfuscation** [facility]  PROGRESS NOTE  DELDRICK Sherman [id number] DOB: Jul 01, 1941 DOA: 11/11/2020 PCP: Merrilee Seashore, MD   Brief HPI:   80 year old male with medical history of diet-controlled diabetes mellitus type 2, hypertension, chronic pain syndrome, OSA noncompliant with CPAP, hyperlipidemia, BPH, recent admission from 10/29/2020 to 11/03/2020 with fall and chest wall/flank pain along with leukocytosis, dehydration, hyponatremia treated with IV fluids and subsequent discharge home despite PT OT recommending skilled nursing facility presenting with multiple episodes of vomiting.  Patient states he has been having nausea and vomiting along with mid abdominal pain.  Nausea vomiting resolved.  Now has worsening lower extremity pain from sciatica.  He has chronic pain syndrome.    Subjective   Patient seen and examined, complains of worsening pain in left lower extremity from his chronic back pain/sciatica.   Assessment/Plan:     1. Gastroenteritis-patient presented with nausea vomiting/diarrhea, resolved.  CT abdomen pelvis showed no acute inflammatory changes, showed no stool throughout colon.  C. difficile was positive for antigen, negative for toxin, no treatment indicated.  GI pathogen panel negative.  Patient symptoms have resolved.   2. Back pain-reviewed MRI of lumbar spine from 2019, patient has severe spinal stenosis, had 5 back surgeries with multilevel disc degeneration disease.  I called and discussed with orthopedic surgeon on call Dr. Mardelle Matte, he recommends giving 80 mg IM Depo-Medrol injection and Toradol 30 mg IM x1.  If no improvement with his medications he recommends consulting IR for lumbar epidural injection. 3. Headache-patient has history of migraine, continue sumatriptan 25 mg p.o. as needed 4. Chronic pain syndrome-patient has chronic back pain and is on multiple medications.  Continue gabapentin, duloxetine, tramadol, oxycodone, hydroxychloroquine, Robaxin.  We  will continue these medications at this time.  Patient pain seems to be overall controlled with his regimen.  Will consult palliative care for further help with pain management. 5. Chest pain-resolved, likely musculoskeletal started after patient was sitting in a chair from bed.  EKG is unremarkable, troponin x2 were negative. 6. Community-acquired pneumonia-chest x-ray showed ill-defined opacity in left base consistent with developing pneumonia, patient is currently on Augmentin.  Patient has received 8 days of antibiotic treatment in the hospital.  Will discontinue antibiotics at this time. 7. Enterococcus UTI-patient has been treated with Augmentin for 5 days, already received ceftriaxone for 3 days.  Antibiotics have been discontinued. 8. Leukocytosis-secondary to community-acquired pneumonia, Enterococcus UTI.   9. Hypertension-continue Catapres, Norvasc 10. Hyponatremia-resolved 11. Diabetes mellitus type 2-continue sliding scale insulin NovoLog, hemoglobin A1c is 5.86 12. ? History of rheumatoid arthritis-patient is on hydroxychloroquine 200 mg p.o. twice daily.     COVID-19 Labs  No results for input(s): DDIMER, FERRITIN, LDH, CRP in the last 72 hours.  Lab Results  Component Value Date   SARSCOV2NAA NEGATIVE 11/17/2020   SARSCOV2NAA NEGATIVE 11/11/2020   [place] NEGATIVE 10/29/2020   [place] NEGATIVE 07/06/2020     Scheduled medications:   . (feeding supplement) PROSource Plus  30 mL Oral BID WC  . amLODipine  10 mg Oral Daily  . cloNIDine  0.1 mg Oral BID  . DULoxetine  30 mg Oral TID  . enoxaparin (LOVENOX) injection  40 mg Subcutaneous Q24H  . feeding supplement (GLUCERNA SHAKE)  237 mL Oral TID BM  . gabapentin  300 mg Oral TID  . hydroxychloroquine  200 mg Oral BID  . insulin aspart  0-15 Units Subcutaneous TID WC  . insulin aspart  0-5 Units Subcutaneous QHS  . pantoprazole  40  mg Oral Daily  . rosuvastatin  40 mg Oral QPM  . tamsulosin  0.4 mg Oral QPC  supper         CBG: Recent Labs  Lab 11/19/20 1607 11/20/20 1151 11/20/20 1617 11/20/20 2248 11/21/20 1159  GLUCAP 121* 242* 214* 192* 168*    SpO2: 100 %    CBC: Recent Labs  Lab 11/15/20 0432 11/16/20 0415 11/17/20 0549 11/18/20 0446 11/20/20 0503  WBC 15.7* 10.9* 12.9* 18.0* 14.3*  HGB 12.2* 11.8* 12.9* 12.2* 11.8*  HCT 36.9* 35.7* 38.4* 36.5* 35.9*  MCV 92.0 90.6 91.4 90.3 92.1  PLT 417* 374 362 356 [id number]    Basic Metabolic Panel: Recent Labs  Lab 11/15/20 0432 11/16/20 0415 11/17/20 0549 11/18/20 0446  NA 134* 133* 135 131*  K 3.3* 3.7 3.8 3.5  CL 102 103 102 100  CO2 21* 20* 21* 21*  GLUCOSE 156* 182* 178* 146*  BUN 28* 36* 38* 37*  CREATININE 0.81 0.83 0.81 0.73  CALCIUM 9.7 9.8 9.8 9.3  MG  --  2.1  --   --       Antibiotics: Anti-infectives (From admission, onward)   Start     Dose/Rate Route Frequency Ordered Stop   11/14/20 1000  amoxicillin-clavulanate (AUGMENTIN) 875-125 MG per tablet 1 tablet  Status:  Discontinued        1 tablet Oral Every 12 hours 11/14/20 0722 11/18/20 1448   11/13/20 1800  azithromycin (ZITHROMAX) tablet 500 mg  Status:  Discontinued        500 mg Oral Daily-1800 11/13/20 1359 11/14/20 0722   11/12/20 1000  hydroxychloroquine (PLAQUENIL) tablet 200 mg        200 mg Oral 2 times daily 11/11/20 1630     11/11/20 1800  cefTRIAXone (ROCEPHIN) 2 g in sodium chloride 0.9 % 100 mL IVPB  Status:  Discontinued        2 g 200 mL/hr over 30 Minutes Intravenous Every 24 hours 11/11/20 1618 11/14/20 0722   11/11/20 1800  azithromycin (ZITHROMAX) 500 mg in sodium chloride 0.9 % 250 mL IVPB  Status:  Discontinued        500 mg 250 mL/hr over 60 Minutes Intravenous Every 24 hours 11/11/20 1618 11/13/20 1359   11/11/20 1645  cefTRIAXone (ROCEPHIN) 2 g in sodium chloride 0.9 % 100 mL IVPB  Status:  Discontinued        2 g 200 mL/hr over 30 Minutes Intravenous Every 24 hours 11/11/20 1630 11/11/20 1705   11/11/20 1645   azithromycin (ZITHROMAX) 500 mg in sodium chloride 0.9 % 250 mL IVPB  Status:  Discontinued        500 mg 250 mL/hr over 60 Minutes Intravenous Every 24 hours 11/11/20 1630 11/11/20 1705   11/11/20 1600  ciprofloxacin (CIPRO) IVPB 400 mg  Status:  Discontinued       "And" Linked Group Details   400 mg 200 mL/hr over 60 Minutes Intravenous  Once 11/11/20 1558 11/11/20 1617   11/11/20 1600  metroNIDAZOLE (FLAGYL) tablet 500 mg  Status:  Discontinued       "And" Linked Group Details   500 mg Oral Every 8 hours 11/11/20 1558 11/11/20 1617       DVT prophylaxis: Lovenox  Code Status:  DNR  Family Communication: No family at bedside   Consultants:    Procedures:      Objective   Vitals:   11/21/20 0013 11/21/20 0500 11/21/20 0551 11/21/20 1337  BP:   [name])  156/77 130/73  Pulse:   87 90  Resp:   18 18  Temp:    98.3 F (36.8 C)  TempSrc:    Oral  SpO2: 99%  98% 100%  Weight:  109.4 kg    Height:        Intake/Output Summary (Last 24 hours) at 11/21/2020 1413 Last data filed at 11/21/2020 1400 Gross per 24 hour  Intake 840 ml  Output 1025 ml  Net -185 ml    12/30 1901 - 01/01 0700 In: 1320 [P.O.:1320] Out: 1400 [Urine:1400]  Filed Weights   11/17/20 0500 11/18/20 0515 11/21/20 0500  Weight: 111.7 kg 111.5 kg 109.4 kg    Physical Examination:   General-appears in no acute distress Heart-S1-S2, regular, no murmur auscultated Lungs-clear to auscultation bilaterally, no wheezing or crackles auscultated Abdomen-soft, nontender, no organomegaly Extremities-no edema in the lower extremities Neuro-alert, oriented x3, no focal deficit noted  Status is: Inpatient  Dispo: The patient is from: Home              Anticipated d/c is to: Skilled nursing facility              Anticipated d/c date is: 11/23/2020              Patient currently is medically stable for discharge  Barrier to discharge-awaiting bed at skilled nursing facility       Data Reviewed:    Recent Results (from the past 240 hour(s))  Resp Panel by RT-PCR (Flu A&B, Covid) Nasopharyngeal Swab     Status: None   Collection Time: 11/11/20  2:14 PM   Specimen: Nasopharyngeal Swab; Nasopharyngeal(NP) swabs in vial transport medium  Result Value Ref Range Status   SARS Coronavirus 2 by RT PCR NEGATIVE NEGATIVE Final    Comment: (NOTE) SARS-CoV-2 target nucleic acids are NOT DETECTED.  The SARS-CoV-2 RNA is generally detectable in upper respiratory specimens during the acute phase of infection. The lowest concentration of SARS-CoV-2 viral copies this assay can detect is 138 copies/mL. A negative result does not preclude SARS-Cov-2 infection and should not be used as the sole basis for treatment or other patient management decisions. A negative result may occur with  improper specimen collection/handling, submission of specimen other than nasopharyngeal swab, presence of viral mutation(s) within the areas targeted by this assay, and inadequate number of viral copies(<138 copies/mL). A negative result must be combined with clinical observations, patient history, and epidemiological information. The expected result is Negative.  Fact Sheet for Patients:  EntrepreneurPulse.com.au  Fact Sheet for Healthcare Providers:  IncredibleEmployment.be  This test is no t yet approved or cleared by the [place] FDA and  has been authorized for detection and/or diagnosis of SARS-CoV-2 by FDA under an Emergency Use Authorization (EUA). This EUA will remain  in effect (meaning this test can be used) for the duration of the COVID-19 declaration under Section 564(b)(1) of the Act, 21 U.S.C.section 360bbb-3(b)(1), unless the authorization is terminated  or revoked sooner.       Influenza A by PCR NEGATIVE NEGATIVE Final   Influenza B by PCR NEGATIVE NEGATIVE Final    Comment: (NOTE) The Xpert Xpress SARS-CoV-2/FLU/RSV plus assay is intended as an  aid in the diagnosis of influenza from Nasopharyngeal swab specimens and should not be used as a sole basis for treatment. Nasal washings and aspirates are unacceptable for Xpert Xpress SARS-CoV-2/FLU/RSV testing.  Fact Sheet for Patients: EntrepreneurPulse.com.au  Fact Sheet for Healthcare Providers: IncredibleEmployment.be  This test is  not yet approved or cleared by the [place] and has been authorized for detection and/or diagnosis of SARS-CoV-2 by FDA under an Emergency Use Authorization (EUA). This EUA will remain in effect (meaning this test can be used) for the duration of the COVID-19 declaration under Section 564(b)(1) of the Act, 21 U.S.C. section 360bbb-3(b)(1), unless the authorization is terminated or revoked.  Performed at [facility], [address], [place], [place] [address]   Urine culture     Status: Abnormal   Collection Time: 11/11/20  4:00 PM   Specimen: In/Out Cath Urine  Result Value Ref Range Status   Specimen Description   Final    IN/OUT CATH URINE Performed at [facility], [address], [place], [place] [address]    Special Requests   Final    NONE Performed at [facility], [address], [place], [place] [address]    Culture >=100,000 COLONIES/mL ENTEROCOCCUS FAECALIS (A)  Final   Report Status 11/14/2020 FINAL  Final   Organism ID, Bacteria ENTEROCOCCUS FAECALIS (A)  Final      Susceptibility   Enterococcus faecalis - MIC*    AMPICILLIN <=2 SENSITIVE Sensitive     NITROFURANTOIN <=16 SENSITIVE Sensitive     VANCOMYCIN 1 SENSITIVE Sensitive     * >=100,000 COLONIES/mL ENTEROCOCCUS FAECALIS  Gastrointestinal Panel by PCR , Stool     Status: None   Collection Time: 11/12/20  6:41 AM   Specimen: STOOL  Result Value Ref Range Status   Campylobacter species NOT DETECTED NOT DETECTED Final   Plesimonas shigelloides NOT DETECTED NOT  DETECTED Final   Salmonella species NOT DETECTED NOT DETECTED Final   Yersinia enterocolitica NOT DETECTED NOT DETECTED Final   Vibrio species NOT DETECTED NOT DETECTED Final   Vibrio cholerae NOT DETECTED NOT DETECTED Final   Enteroaggregative E coli (EAEC) NOT DETECTED NOT DETECTED Final   Enteropathogenic E coli (EPEC) NOT DETECTED NOT DETECTED Final   Enterotoxigenic E coli (ETEC) NOT DETECTED NOT DETECTED Final   Shiga like toxin producing E coli (STEC) NOT DETECTED NOT DETECTED Final   Shigella/Enteroinvasive E coli (EIEC) NOT DETECTED NOT DETECTED Final   Cryptosporidium NOT DETECTED NOT DETECTED Final   Cyclospora cayetanensis NOT DETECTED NOT DETECTED Final   Entamoeba histolytica NOT DETECTED NOT DETECTED Final   Giardia lamblia NOT DETECTED NOT DETECTED Final   Adenovirus F40/41 NOT DETECTED NOT DETECTED Final   Astrovirus NOT DETECTED NOT DETECTED Final   Norovirus GI/GII NOT DETECTED NOT DETECTED Final   Rotavirus A NOT DETECTED NOT DETECTED Final   Sapovirus (I, II, IV, and V) NOT DETECTED NOT DETECTED Final    Comment: Performed at [facility], [address] [place], [place] [address]  C Difficile Quick Screen (NO PCR Reflex)     Status: Abnormal   Collection Time: 11/12/20  6:42 AM   Specimen: STOOL  Result Value Ref Range Status   C Diff antigen POSITIVE (A) NEGATIVE Final   C Diff toxin NEGATIVE NEGATIVE Final   C Diff interpretation   Final    Results are indeterminate. Please contact the provider listed for your campus for C diff questions in AMION.    Comment: Performed at [facility], [address] [place], [place] [address]  SARS CORONAVIRUS 2 (TAT 6-24 HRS) Nasopharyngeal Nasopharyngeal Swab     Status: None   Collection Time: 11/17/20 10:07 AM   Specimen: Nasopharyngeal Swab  Result Value  Ref Range Status   SARS Coronavirus 2 NEGATIVE NEGATIVE Final    Comment: (NOTE) SARS-CoV-2 target nucleic acids are NOT  DETECTED.  The SARS-CoV-2 RNA is generally detectable in upper and lower respiratory specimens during the acute phase of infection. Negative results do not preclude SARS-CoV-2 infection, do not rule out co-infections with other pathogens, and should not be used as the sole basis for treatment or other patient management decisions. Negative results must be combined with clinical observations, patient history, and epidemiological information. The expected result is Negative.  Fact Sheet for Patients: SugarRoll.be  Fact Sheet for Healthcare Providers: https://www.woods-mathews.com/  This test is not yet approved or cleared by the [place] FDA and  has been authorized for detection and/or diagnosis of SARS-CoV-2 by FDA under an Emergency Use Authorization (EUA). This EUA will remain  in effect (meaning this test can be used) for the duration of the COVID-19 declaration under Se ction 564(b)(1) of the Act, 21 U.S.C. section 360bbb-3(b)(1), unless the authorization is terminated or revoked sooner.  Performed at [facility], [place] [address] [place], [place] [address]          [facility] If 7PM-7AM, please contact night-coverage at [email or web address], Office  [phone]   11/21/2020, 2:13 PM  LOS: 10 days

## 2020-11-21 NOTE — Progress Notes (Signed)
Late entry for 11/20/2020 Pt was very demanding all day, abusive to staff, cursing and yelling most of the day. Pt was given multiple prn meds every 1-2 hours but pt continued to complain and yell in spite of every effort to make him comfortable. Pt pulling at condom cath, making comments about his genitals to staff. MD made aware of intense demands and other behaviors to staff.

## 2020-11-22 DIAGNOSIS — M5440 Lumbago with sciatica, unspecified side: Secondary | ICD-10-CM

## 2020-11-22 DIAGNOSIS — I10 Essential (primary) hypertension: Secondary | ICD-10-CM | POA: Diagnosis not present

## 2020-11-22 DIAGNOSIS — G894 Chronic pain syndrome: Secondary | ICD-10-CM | POA: Diagnosis not present

## 2020-11-22 DIAGNOSIS — K529 Noninfective gastroenteritis and colitis, unspecified: Secondary | ICD-10-CM | POA: Diagnosis not present

## 2020-11-22 DIAGNOSIS — D72829 Elevated white blood cell count, unspecified: Secondary | ICD-10-CM | POA: Diagnosis not present

## 2020-11-22 DIAGNOSIS — R109 Unspecified abdominal pain: Secondary | ICD-10-CM

## 2020-11-22 DIAGNOSIS — G8929 Other chronic pain: Secondary | ICD-10-CM

## 2020-11-22 LAB — GLUCOSE, CAPILLARY
Glucose-Capillary: 140 mg/dL — ABNORMAL HIGH (ref 70–99)
Glucose-Capillary: 196 mg/dL — ABNORMAL HIGH (ref 70–99)

## 2020-11-22 MED ORDER — POLYETHYLENE GLYCOL 3350 17 G PO PACK
17.0000 g | PACK | Freq: Every day | ORAL | Status: DC | PRN
  Administered 2020-11-22 – 2020-11-24 (×2): 17 g via ORAL
  Filled 2020-11-22 (×2): qty 1

## 2020-11-22 MED ORDER — KETOROLAC TROMETHAMINE 30 MG/ML IJ SOLN
30.0000 mg | Freq: Once | INTRAMUSCULAR | Status: AC
  Administered 2020-11-22: 30 mg via INTRAVENOUS
  Filled 2020-11-22: qty 1

## 2020-11-22 MED ORDER — KETOROLAC TROMETHAMINE 15 MG/ML IJ SOLN
15.0000 mg | Freq: Once | INTRAMUSCULAR | Status: AC
  Administered 2020-11-22: 21:00:00 15 mg via INTRAVENOUS
  Filled 2020-11-22: qty 1

## 2020-11-22 MED ORDER — KETOROLAC TROMETHAMINE 30 MG/ML IJ SOLN: 30.0000 mg | Freq: Once | INTRAMUSCULAR | Status: DC

## 2020-11-22 NOTE — Progress Notes (Signed)
Patient ID: Jorge Sherman, male   DOB: Jul 07, 1941, 80 y.o.   MRN: [id number]  PROGRESS NOTE    Jorge Sherman  [id number] DOB: [birth year] DOA: 11/11/2020 PCP: Merrilee Seashore, MD   Brief Narrative:  80 year old male with history of diet-controlled diabetes mellitus type 2, hypertension, chronic pain syndrome, OSA noncompliant with CPAP, hyperlipidemia, BPH, recent admission from 10/29/2020-11/03/2020 with fall and chest wall/flank pain along with leukocytosis, dehydration, hyponatremia treated with IV fluids and subsequent discharge home despite PT/OT recommending SNF presented with multiple episodes of vomiting along with diarrhea and abdominal pain.  He was treated with antibiotics for possible community-acquired pneumonia and Enterococcus UTI and completed antibiotic treatment.  During the hospitalization, patient's GI symptoms have improved.  She now has worsening lower extremity pain from sciatica; also has chronic pain syndrome.  PT has recommended SNF.  Assessment & Plan:   Acute gastroenteritis -Presented with nausea, vomiting/diarrhea and abdominal pain.  CT abdomen and pelvis showed no acute inflammatory changes, and showed diarrheal state. -Stool for C. difficile were positive for antigen, negative for toxin: No treatment indicated.  GI pathogen PCR negative -Symptoms have resolved.  Tolerating diet.  Worsening chronic back pain -MRI of lumbar spine from 2019 had shown severe spinal stenosis; had 5 back surgeries with multilevel disc degeneration disease.  Prior hospitalist spoke to orthopedic surgeon on call Dr. Mardelle Matte who recommended getting Depo-Medrol 80 mg IM and Toradol 30 mg IM x1.  If no improvement with his symptoms, he had recommended lumbar epidural injection by IR.  Patient still complaining of severe back pain and sciatica.  Will give 1 more dose of Toradol today.  Headache/history of migraine -Continue as needed sumatriptan  Chronic pain syndrome -He is on  multiple meds.  Continue gabapentin, duloxetine, tramadol, oxycodone, hydroxychloroquine, Robaxin.  Outpatient follow-up with pain management  Chest pain -Likely musculoskeletal.  EKG unremarkable; troponin x2 were negative.  Resolved  Community-acquired pneumonia Enterococcus UTI -Treated with antibiotics and subsequently discontinued.  Leukocytosis -No recent labs.  Hypertension -Continue amlodipine and clonidine  Hyponatremia -No recent labs.  Repeat a.m. labs  Diabetes mellitus type 2 with hyperglycemia -Continue CBGs with SSI  Hyperlipidemia -Continue statin  ?  History of rheumatoid arthritis -Patient is on hydroxychloroquine 200 mg twice a day  Generalized deconditioning -PT recommending SNF placement.  Social worker following    DVT prophylaxis: Lovenox Code Status: DNR Family Communication: None at bedside Disposition Plan: Status is: Inpatient  Remains inpatient appropriate because:Inpatient level of care appropriate due to severity of illness   Dispo: The patient is from: Home              Anticipated d/c is to: SNF              Anticipated d/c date is: 1 day              Patient currently is medically stable to d/c.  Consultants: None  Procedures: None  Antimicrobials:  Anti-infectives (From admission, onward)   Start     Dose/Rate Route Frequency Ordered Stop   11/14/20 1000  amoxicillin-clavulanate (AUGMENTIN) 875-125 MG per tablet 1 tablet  Status:  Discontinued        1 tablet Oral Every 12 hours 11/14/20 0722 11/18/20 1448   11/13/20 1800  azithromycin (ZITHROMAX) tablet 500 mg  Status:  Discontinued        500 mg Oral Daily-1800 11/13/20 1359 11/14/20 0722   11/12/20 1000  hydroxychloroquine (PLAQUENIL) tablet 200 mg  200 mg Oral 2 times daily 11/11/20 1630     11/11/20 1800  cefTRIAXone (ROCEPHIN) 2 g in sodium chloride 0.9 % 100 mL IVPB  Status:  Discontinued        2 g 200 mL/hr over 30 Minutes Intravenous Every 24 hours 11/11/20  1618 11/14/20 0722   11/11/20 1800  azithromycin (ZITHROMAX) 500 mg in sodium chloride 0.9 % 250 mL IVPB  Status:  Discontinued        500 mg 250 mL/hr over 60 Minutes Intravenous Every 24 hours 11/11/20 1618 11/13/20 1359   11/11/20 1645  cefTRIAXone (ROCEPHIN) 2 g in sodium chloride 0.9 % 100 mL IVPB  Status:  Discontinued        2 g 200 mL/hr over 30 Minutes Intravenous Every 24 hours 11/11/20 1630 11/11/20 1705   11/11/20 1645  azithromycin (ZITHROMAX) 500 mg in sodium chloride 0.9 % 250 mL IVPB  Status:  Discontinued        500 mg 250 mL/hr over 60 Minutes Intravenous Every 24 hours 11/11/20 1630 11/11/20 1705   11/11/20 1600  ciprofloxacin (CIPRO) IVPB 400 mg  Status:  Discontinued       "And" Linked Group Details   400 mg 200 mL/hr over 60 Minutes Intravenous  Once 11/11/20 1558 11/11/20 1617   11/11/20 1600  metroNIDAZOLE (FLAGYL) tablet 500 mg  Status:  Discontinued       "And" Linked Group Details   500 mg Oral Every 8 hours 11/11/20 1558 11/11/20 1617       Subjective: Patient seen and examined at bedside.  Complains of severe low back pain and states that his sciatica is flaring up and is requesting for injection.  Poor historian.  No overnight fever, vomiting or worsening shortness of breath reported.  Objective: Vitals:   11/21/20 1337 11/21/20 2105 11/21/20 2312 11/22/20 0507  BP: 130/73 119/76  (!) 154/79  Pulse: 90 85  82  Resp: 18 18  18   Temp: 98.3 F (36.8 C) 98.1 F (36.7 C)  98 F (36.7 C)  TempSrc: Oral Oral  Oral  SpO2: 100% 100% 99% 97%  Weight:      Height:        Intake/Output Summary (Last 24 hours) at 11/22/2020 0931 Last data filed at 11/22/2020 0844 Gross per 24 hour  Intake 1880 ml  Output 3525 ml  Net -1645 ml   Filed Weights   11/17/20 0500 11/18/20 0515 11/21/20 0500  Weight: 111.7 kg 111.5 kg 109.4 kg    Examination:  General exam: Appears calm and comfortable.  Looks chronically ill.  Currently on room air Respiratory system:  Bilateral decreased breath sounds at bases with scattered crackles Cardiovascular system: S1 & S2 heard, Rate controlled Gastrointestinal system: Abdomen is obese, nondistended, soft and nontender. Normal bowel sounds heard. Extremities: No cyanosis, clubbing; trace lower extremity edema Central nervous system: Awake, answering questions.  No focal neurological deficits. Moving extremities Skin: No rashes, lesions or ulcers Psychiatry: Looks intermittently anxious..     Data Reviewed: I have personally reviewed following labs and imaging studies  CBC: Recent Labs  Lab 11/16/20 0415 11/17/20 0549 11/18/20 0446 11/20/20 0503  WBC 10.9* 12.9* 18.0* 14.3*  HGB 11.8* 12.9* 12.2* 11.8*  HCT 35.7* 38.4* 36.5* 35.9*  MCV 90.6 91.4 90.3 92.1  PLT 374 362 356 [id number]   Basic Metabolic Panel: Recent Labs  Lab 11/16/20 0415 11/17/20 0549 11/18/20 0446  NA 133* 135 131*  K 3.7 3.8 3.5  CL  103 102 100  CO2 20* 21* 21*  GLUCOSE 182* 178* 146*  BUN 36* 38* 37*  CREATININE 0.83 0.81 0.73  CALCIUM 9.8 9.8 9.3  MG 2.1  --   --    GFR: Estimated Creatinine Clearance: 94.1 mL/min (by C-G formula based on SCr of 0.73 mg/dL). Liver Function Tests: No results for input(s): AST, ALT, ALKPHOS, BILITOT, PROT, ALBUMIN in the last 168 hours. No results for input(s): LIPASE, AMYLASE in the last 168 hours. No results for input(s): AMMONIA in the last 168 hours. Coagulation Profile: No results for input(s): INR, PROTIME in the last 168 hours. Cardiac Enzymes: No results for input(s): CKTOTAL, CKMB, CKMBINDEX, TROPONINI in the last 168 hours. BNP (last 3 results) No results for input(s): PROBNP in the last 8760 hours. HbA1C: No results for input(s): HGBA1C in the last 72 hours. CBG: Recent Labs  Lab 11/20/20 2248 11/21/20 1159 11/21/20 1635 11/21/20 2057 11/22/20 0729  GLUCAP 192* 168* 212* 170* 140*   Lipid Profile: No results for input(s): CHOL, HDL, LDLCALC, TRIG, CHOLHDL, LDLDIRECT  in the last 72 hours. Thyroid Function Tests: No results for input(s): TSH, T4TOTAL, FREET4, T3FREE, THYROIDAB in the last 72 hours. Anemia Panel: No results for input(s): VITAMINB12, FOLATE, FERRITIN, TIBC, IRON, RETICCTPCT in the last 72 hours. Sepsis Labs: No results for input(s): PROCALCITON, LATICACIDVEN in the last 168 hours.  Recent Results (from the past 240 hour(s))  SARS CORONAVIRUS 2 (TAT 6-24 HRS) Nasopharyngeal Nasopharyngeal Swab     Status: None   Collection Time: 11/17/20 10:07 AM   Specimen: Nasopharyngeal Swab  Result Value Ref Range Status   SARS Coronavirus 2 NEGATIVE NEGATIVE Final    Comment: (NOTE) SARS-CoV-2 target nucleic acids are NOT DETECTED.  The SARS-CoV-2 RNA is generally detectable in upper and lower respiratory specimens during the acute phase of infection. Negative results do not preclude SARS-CoV-2 infection, do not rule out co-infections with other pathogens, and should not be used as the sole basis for treatment or other patient management decisions. Negative results must be combined with clinical observations, patient history, and epidemiological information. The expected result is Negative.  Fact Sheet for Patients: HairSlick.no  Fact Sheet for Healthcare Providers: quierodirigir.com  This test is not yet approved or cleared by the [place] FDA and  has been authorized for detection and/or diagnosis of SARS-CoV-2 by FDA under an Emergency Use Authorization (EUA). This EUA will remain  in effect (meaning this test can be used) for the duration of the COVID-19 declaration under Se ction 564(b)(1) of the Act, 21 U.S.C. section 360bbb-3(b)(1), unless the authorization is terminated or revoked sooner.  Performed at [facility], [address] [place], [place] [address]          Radiology Studies: No results found.      Scheduled Meds: . (feeding supplement)  PROSource Plus  30 mL Oral BID WC  . amLODipine  10 mg Oral Daily  . cloNIDine  0.1 mg Oral BID  . DULoxetine  30 mg Oral TID  . enoxaparin (LOVENOX) injection  40 mg Subcutaneous Q24H  . feeding supplement (GLUCERNA SHAKE)  237 mL Oral TID BM  . gabapentin  300 mg Oral TID  . hydroxychloroquine  200 mg Oral BID  . insulin aspart  0-15 Units Subcutaneous TID WC  . insulin aspart  0-5 Units Subcutaneous QHS  . pantoprazole  40 mg Oral Daily  . rosuvastatin  40 mg Oral QPM  . tamsulosin  0.4 mg  Oral QPC supper   Continuous Infusions:        Glade Izekiel, MD [facility] 11/22/2020, 9:31 AM

## 2020-11-22 NOTE — Progress Notes (Signed)
Palliative care consult received.  Chart reviewed.  Palliative was asked to consult for " chronic pain."  As Mr. Tory has chronic pain that is not related to a cancer diagnosis and he does not have a specific life limiting illness with a goal for comfort, this is outside the scope of palliative care team.  I would recommend continuing to follow recommendations from neurosurgery as well as pursuing outpatient follow-up with pain management physician as plan already indicates.  No other specific recommendations at this time.  Palliative care to sign off.  Please call if there are specific palliative related questions which we can be of the care of Mr. Fandrich moving forward.  Romie Minus, MD [facility] Palliative Medicine Team [phone]  NO CHARGE NOTE

## 2020-11-22 NOTE — Progress Notes (Signed)
Occupational Therapy Treatment Patient Details Name: Jorge Sherman MRN: [id number] DOB: 03/26/1941 Today's Date: 11/22/2020    History of present illness Jorge Sherman is a 80 y.o. male with medical history significant of diet-controlled diabetes mellitus type 2, hypertension, chronic pain, OSA noncompliant with CPAP, hyperlipidemia, BPH, recent admission from 10/29/2020-11/03/2020 with fall and chest wall/flank pain along with leukocytosis, dehydration and hyponatremia treated with IV fluids and subsequent discharge home despite PT/OT recommending SNF presented with multiple episodes of vomiting.  Patient states that he has been having nausea and multiple episodes of nonbloody nonbilious vomiting since last night along with multiple episodes of watery diarrhea and midabdominal pain. He was found to have Gastroenteritis and CAP   OT comments  Jorge Sherman reports improved pain today in back and shoulders. Patient mod assist to transfer to side of bed and improved movement of LLE with transfer. Attempted stand x 2 with use of stedy. Unable to lift buttocks off bed with first attempt at stand and patient needed higher surface. ON second stand patient grossly into standing with mod assist from therapist but unexpected BM limited further standing and transfer to chair. Patient returned to supine with mod assist (LEs) in order for patient care. Today patient exhibited improved activity tolerance and ability to perform sit to stand. Con POC to progress towards goals. Continue to recommend short term rehab to maximize patient's functional abilities and strength in order for patient to return home.  Follow Up Recommendations  SNF    Equipment Recommendations  None recommended by OT    Recommendations for Other Services      Precautions / Restrictions Precautions Precautions: Fall Precaution Comments: uses a scooter for mobility Restrictions Weight Bearing Restrictions: No       Mobility Bed  Mobility Overal bed mobility: Needs Assistance Bed Mobility: Supine to Sit;Sit to Supine     Supine to sit: Mod assist Sit to supine: Mod assist;HOB elevated Sit to sidelying: Mod assist General bed mobility comments: Mod assist for trunk lift off and pivot to edge of bed with use of bed pad. Patient able to assist with LEs to edge of bed. Improved use of LLE today. On return to supine - patient needed assistance to get LEs back into bed. Max assist with bed pad and bed positioning to pull patient up in bed.  Transfers Overall transfer level: Needs assistance   Transfers: Sit to/from Stand Sit to Stand: Mod assist;From elevated surface         General transfer comment: On initial stand patient unable to extend knees and needed higher surface. ON second stand patient able to grossly come into standing though knees still flexed and patient's trunk still flexed - but patient beginning to become incontinent of bowels. Returned to supine for bowel movement.    Balance   Sitting-balance support: No upper extremity supported;Feet supported Sitting balance-Leahy Scale: Fair     Standing balance support: Bilateral upper extremity supported;During functional activity Standing balance-Leahy Scale: Poor Standing balance comment: reliant on external support                           ADL either performed or assessed with clinical judgement   ADL  Vision Baseline Vision/History: No visual deficits Patient Visual Report: No change from baseline     Perception     Praxis      Cognition Arousal/Alertness: Awake/alert Behavior During Therapy: WFL for tasks assessed/performed Overall Cognitive Status: Within Functional Limits for tasks assessed                                          Exercises     Shoulder Instructions       General Comments      Pertinent Vitals/ Pain       Pain  Assessment: Faces Faces Pain Scale: Hurts a little bit Pain Location: Bil shoulders Pain Descriptors / Indicators: Discomfort;Grimacing;Aching;Moaning;Sharp Pain Intervention(s): Monitored during session  Home Living                                          Prior Functioning/Environment              Frequency  Min 2X/week        Progress Toward Goals  OT Goals(current goals can now be found in the care plan section)  Progress towards OT goals: Progressing toward goals  Acute Rehab OT Goals Patient Stated Goal: to get stronger and return to baseline OT Goal Formulation: With patient Time For Goal Achievement: 11/26/20 Potential to Achieve Goals: Fair  Plan Discharge plan remains appropriate    Co-evaluation                 AM-PAC OT "6 Clicks" Daily Activity     Outcome Measure   Help from another person eating meals?: A Little Help from another person taking care of personal grooming?: A Little Help from another person toileting, which includes using toliet, bedpan, or urinal?: Total Help from another person bathing (including washing, rinsing, drying)?: Total Help from another person to put on and taking off regular upper body clothing?: A Lot Help from another person to put on and taking off regular lower body clothing?: Total 6 Click Score: 11    End of Session Equipment Utilized During Treatment: Gait belt  OT Visit Diagnosis: Unsteadiness on feet (R26.81);Other abnormalities of gait and mobility (R26.89);Muscle weakness (generalized) (M62.81);History of falling (Z91.81);Repeated falls (R29.6);Pain Pain - Right/Left:  (bilateral) Pain - part of body: Shoulder   Activity Tolerance Patient tolerated treatment well   Patient Left in bed;with call bell/phone within reach;with bed alarm set;with nursing/sitter in room   Nurse Communication  (okay to see per RN)        Time: 6578-4696 OT Time Calculation (min): 19 min  Charges:  OT General Charges $OT Visit: 1 Visit OT Treatments $Therapeutic Activity: 8-22 mins  Waldron Session, OTR/L Acute Care Rehab Services  Office [phone] Pager: [phone]    Kelli Churn 11/22/2020, 2:37 PM

## 2020-11-23 DIAGNOSIS — I2510 Atherosclerotic heart disease of native coronary artery without angina pectoris: Secondary | ICD-10-CM | POA: Diagnosis not present

## 2020-11-23 DIAGNOSIS — I10 Essential (primary) hypertension: Secondary | ICD-10-CM | POA: Diagnosis not present

## 2020-11-23 DIAGNOSIS — M545 Low back pain, unspecified: Secondary | ICD-10-CM

## 2020-11-23 DIAGNOSIS — K529 Noninfective gastroenteritis and colitis, unspecified: Secondary | ICD-10-CM | POA: Diagnosis not present

## 2020-11-23 DIAGNOSIS — G894 Chronic pain syndrome: Secondary | ICD-10-CM | POA: Diagnosis not present

## 2020-11-23 LAB — BASIC METABOLIC PANEL
Anion gap: 10 (ref 5–15)
BUN: 27 mg/dL — ABNORMAL HIGH (ref 8–23)
CO2: 25 mmol/L (ref 22–32)
Calcium: 9.3 mg/dL (ref 8.9–10.3)
Chloride: 93 mmol/L — ABNORMAL LOW (ref 98–111)
Creatinine, Ser: 0.75 mg/dL (ref 0.61–1.24)
GFR, Estimated: >60 mL/min (ref >60)
Glucose, Bld: 175 mg/dL — ABNORMAL HIGH (ref 70–99)
Potassium: 4.6 mmol/L (ref 3.5–5.1)
Sodium: 128 mmol/L — ABNORMAL LOW (ref 135–145)

## 2020-11-23 LAB — GLUCOSE, CAPILLARY
Glucose-Capillary: 179 mg/dL — ABNORMAL HIGH (ref 70–99)
Glucose-Capillary: 219 mg/dL — ABNORMAL HIGH (ref 70–99)

## 2020-11-23 LAB — URINALYSIS, ROUTINE W REFLEX MICROSCOPIC
Bilirubin Urine: NEGATIVE
Glucose, UA: 50 mg/dL — AB
Hgb urine dipstick: NEGATIVE
Ketones, ur: NEGATIVE mg/dL
Leukocytes,Ua: NEGATIVE
Nitrite: NEGATIVE
Protein, ur: NEGATIVE mg/dL
Specific Gravity, Urine: 1.006 (ref 1.005–1.030)
pH: 7 (ref 5.0–8.0)

## 2020-11-23 LAB — MAGNESIUM: Magnesium: 2.1 mg/dL (ref 1.7–2.4)

## 2020-11-23 NOTE — Progress Notes (Signed)
Patient ID: Jorge Sherman, male   DOB: 04-27-1941, 80 y.o.   MRN: [id number]  PROGRESS NOTE    Jorge Sherman  VVO:[id number] DOB: 1941/04/28 DOA: 11/11/2020 PCP: Georgianne Fick, MD   Brief Narrative:  80 year old male with history of diet-controlled diabetes mellitus type 2, hypertension, chronic pain syndrome, OSA noncompliant with CPAP, hyperlipidemia, BPH, recent admission from 10/29/2020-11/03/2020 with fall and chest wall/flank pain along with leukocytosis, dehydration, hyponatremia treated with IV fluids and subsequent discharge home despite PT/OT recommending SNF presented with multiple episodes of vomiting along with diarrhea and abdominal pain.  He was treated with antibiotics for possible community-acquired pneumonia and Enterococcus UTI and completed antibiotic treatment.  During the hospitalization, patient's GI symptoms have improved.  She now has worsening lower extremity pain from sciatica; also has chronic pain syndrome.  PT has recommended SNF.  Assessment & Plan:   Acute gastroenteritis -Presented with nausea, vomiting/diarrhea and abdominal pain.  CT abdomen and pelvis showed no acute inflammatory changes, and showed diarrheal state. -Stool for C. difficile were positive for antigen, negative for toxin: No treatment indicated.  GI pathogen PCR negative -Symptoms have resolved.  Tolerating diet.  Worsening chronic back pain -MRI of lumbar spine from 2019 had shown severe spinal stenosis; had 5 back surgeries with multilevel disc degeneration disease.  Prior hospitalist spoke to orthopedic surgeon on call Dr. Dion Saucier who recommended getting Depo-Medrol 80 mg IM and Toradol 30 mg IM x1.  If no improvement with his symptoms, he had recommended lumbar epidural injection by IR.  Patient received 1 more dose of Toradol on 11/22/2020.  Will request IR evaluation to see if they can do epidural injection.  Headache/history of migraine -Continue as needed sumatriptan  Chronic pain  syndrome -He is on multiple meds.  Continue gabapentin, duloxetine, tramadol, oxycodone, hydroxychloroquine, Robaxin.  Outpatient follow-up with pain management  Chest pain -Likely musculoskeletal.  EKG unremarkable; troponin x2 were negative.  Resolved  Community-acquired pneumonia Enterococcus UTI -Treated with antibiotics and subsequently discontinued.  Leukocytosis -No WBC available yet today.  Hypertension -Continue amlodipine and clonidine  Hyponatremia -Sodium 128 today.  Monitor.  Diabetes mellitus type 2 with hyperglycemia -Continue CBGs with SSI  Hyperlipidemia -Continue statin  ?  History of rheumatoid arthritis -Patient is on hydroxychloroquine 200 mg twice a day  Generalized deconditioning -PT recommending SNF placement.  Social worker following    DVT prophylaxis: Lovenox Code Status: DNR Family Communication: None at bedside Disposition Plan: Status is: Inpatient  Remains inpatient appropriate because:Inpatient level of care appropriate due to severity of illness   Dispo: The patient is from: Home              Anticipated d/c is to: SNF              Anticipated d/c date is: 1 day              Patient currently is medically stable to d/c.  Consultants: None  Procedures: None  Antimicrobials:  Anti-infectives (From admission, onward)   Start     Dose/Rate Route Frequency Ordered Stop   11/14/20 1000  amoxicillin-clavulanate (AUGMENTIN) 875-125 MG per tablet 1 tablet  Status:  Discontinued        1 tablet Oral Every 12 hours 11/14/20 0722 11/18/20 1448   11/13/20 1800  azithromycin (ZITHROMAX) tablet 500 mg  Status:  Discontinued        500 mg Oral Daily-1800 11/13/20 1359 11/14/20 0722   11/12/20 1000  hydroxychloroquine (PLAQUENIL) tablet 200  mg        200 mg Oral 2 times daily 11/11/20 1630     11/11/20 1800  cefTRIAXone (ROCEPHIN) 2 g in sodium chloride 0.9 % 100 mL IVPB  Status:  Discontinued        2 g 200 mL/hr over 30 Minutes  Intravenous Every 24 hours 11/11/20 1618 11/14/20 0722   11/11/20 1800  azithromycin (ZITHROMAX) 500 mg in sodium chloride 0.9 % 250 mL IVPB  Status:  Discontinued        500 mg 250 mL/hr over 60 Minutes Intravenous Every 24 hours 11/11/20 1618 11/13/20 1359   11/11/20 1645  cefTRIAXone (ROCEPHIN) 2 g in sodium chloride 0.9 % 100 mL IVPB  Status:  Discontinued        2 g 200 mL/hr over 30 Minutes Intravenous Every 24 hours 11/11/20 1630 11/11/20 1705   11/11/20 1645  azithromycin (ZITHROMAX) 500 mg in sodium chloride 0.9 % 250 mL IVPB  Status:  Discontinued        500 mg 250 mL/hr over 60 Minutes Intravenous Every 24 hours 11/11/20 1630 11/11/20 1705   11/11/20 1600  ciprofloxacin (CIPRO) IVPB 400 mg  Status:  Discontinued       "And" Linked Group Details   400 mg 200 mL/hr over 60 Minutes Intravenous  Once 11/11/20 1558 11/11/20 1617   11/11/20 1600  metroNIDAZOLE (FLAGYL) tablet 500 mg  Status:  Discontinued       "And" Linked Group Details   500 mg Oral Every 8 hours 11/11/20 1558 11/11/20 1617       Subjective: Patient seen and examined at bedside.  Still complains of severe low back pain and stated that Toradol helped yesterday.  Poor historian.  No overnight fever, worsening shortness of breath or vomiting reported. Objective: Vitals:   11/22/20 0507 11/22/20 1347 11/22/20 2040 11/23/20 0612  BP: (!) 154/79 127/72 (!) 141/75 122/82  Pulse: 82 83 82 75  Resp: 18 18 18 18   Temp: 98 F (36.7 C) 98 F (36.7 C) 98.4 F (36.9 C) 98.1 F (36.7 C)  TempSrc: Oral Oral Oral Oral  SpO2: 97% 100% 99% 95%  Weight:      Height:        Intake/Output Summary (Last 24 hours) at 11/23/2020 0751 Last data filed at 11/22/2020 1812 Gross per 24 hour  Intake 500 ml  Output 950 ml  Net -450 ml   Filed Weights   11/17/20 0500 11/18/20 0515 11/21/20 0500  Weight: 111.7 kg 111.5 kg 109.4 kg    Examination:  General exam: Chronically ill-appearing.  No acute distress currently.   Currently on room air Respiratory system: Decreased breath sounds at bases bilaterally with some crackles, no wheezing Cardiovascular system: Rate controlled, S1-S2 heard  gastrointestinal system: Abdomen is obese, nondistended, soft and nontender.  Bowel sounds are heard  extremities: Mild lower extremity edema present.  No cyanosis Central nervous system: Alert, answers some questions.  Poor historian.  No focal neurological deficits.  Moves extremities. Skin: No obvious ecchymosis/lesions Psychiatry: Anxious looking    Data Reviewed: I have personally reviewed following labs and imaging studies  CBC: Recent Labs  Lab 11/17/20 0549 11/18/20 0446 11/20/20 0503  WBC 12.9* 18.0* 14.3*  HGB 12.9* 12.2* 11.8*  HCT 38.4* 36.5* 35.9*  MCV 91.4 90.3 92.1  PLT 362 356 285   Basic Metabolic Panel: Recent Labs  Lab 11/17/20 0549 11/18/20 0446 11/23/20 0548  NA 135 131* 128*  K 3.8 3.5 4.6  CL 102 100 93*  CO2 21* 21* 25  GLUCOSE 178* 146* 175*  BUN 38* 37* 27*  CREATININE 0.81 0.73 0.75  CALCIUM 9.8 9.3 9.3  MG  --   --  2.1   GFR: Estimated Creatinine Clearance: 94.1 mL/min (by C-G formula based on SCr of 0.75 mg/dL). Liver Function Tests: No results for input(s): AST, ALT, ALKPHOS, BILITOT, PROT, ALBUMIN in the last 168 hours. No results for input(s): LIPASE, AMYLASE in the last 168 hours. No results for input(s): AMMONIA in the last 168 hours. Coagulation Profile: No results for input(s): INR, PROTIME in the last 168 hours. Cardiac Enzymes: No results for input(s): CKTOTAL, CKMB, CKMBINDEX, TROPONINI in the last 168 hours. BNP (last 3 results) No results for input(s): PROBNP in the last 8760 hours. HbA1C: No results for input(s): HGBA1C in the last 72 hours. CBG: Recent Labs  Lab 11/21/20 1635 11/21/20 2057 11/22/20 0729 11/22/20 2044 11/23/20 0738  GLUCAP 212* 170* 140* 196* 179*   Lipid Profile: No results for input(s): CHOL, HDL, LDLCALC, TRIG, CHOLHDL,  LDLDIRECT in the last 72 hours. Thyroid Function Tests: No results for input(s): TSH, T4TOTAL, FREET4, T3FREE, THYROIDAB in the last 72 hours. Anemia Panel: No results for input(s): VITAMINB12, FOLATE, FERRITIN, TIBC, IRON, RETICCTPCT in the last 72 hours. Sepsis Labs: No results for input(s): PROCALCITON, LATICACIDVEN in the last 168 hours.  Recent Results (from the past 240 hour(s))  SARS CORONAVIRUS 2 (TAT 6-24 HRS) Nasopharyngeal Nasopharyngeal Swab     Status: None   Collection Time: 11/17/20 10:07 AM   Specimen: Nasopharyngeal Swab  Result Value Ref Range Status   SARS Coronavirus 2 NEGATIVE NEGATIVE Final    Comment: (NOTE) SARS-CoV-2 target nucleic acids are NOT DETECTED.  The SARS-CoV-2 RNA is generally detectable in upper and lower respiratory specimens during the acute phase of infection. Negative results do not preclude SARS-CoV-2 infection, do not rule out co-infections with other pathogens, and should not be used as the sole basis for treatment or other patient management decisions. Negative results must be combined with clinical observations, patient history, and epidemiological information. The expected result is Negative.  Fact Sheet for Patients: HairSlick.no  Fact Sheet for Healthcare Providers: quierodirigir.com  This test is not yet approved or cleared by the [place] FDA and  has been authorized for detection and/or diagnosis of SARS-CoV-2 by FDA under an Emergency Use Authorization (EUA). This EUA will remain  in effect (meaning this test can be used) for the duration of the COVID-19 declaration under Se ction 564(b)(1) of the Act, 21 U.S.C. section 360bbb-3(b)(1), unless the authorization is terminated or revoked sooner.  Performed at [facility], [address], [place], [place] [address]          Radiology Studies: No results found.      Scheduled Meds: . (feeding  supplement) PROSource Plus  30 mL Oral BID WC  . amLODipine  10 mg Oral Daily  . cloNIDine  0.1 mg Oral BID  . DULoxetine  30 mg Oral TID  . enoxaparin (LOVENOX) injection  40 mg Subcutaneous Q24H  . feeding supplement (GLUCERNA SHAKE)  237 mL Oral TID BM  . gabapentin  300 mg Oral TID  . hydroxychloroquine  200 mg Oral BID  . insulin aspart  0-15 Units Subcutaneous TID WC  . insulin aspart  0-5 Units Subcutaneous QHS  . pantoprazole  40 mg Oral Daily  . rosuvastatin  40 mg Oral QPM  . tamsulosin  0.4  mg Oral QPC supper   Continuous Infusions:        Aline August, MD [facility] 11/23/2020, 7:51 AM

## 2020-11-23 NOTE — Progress Notes (Signed)
Patient ID: Jorge Sherman, male   DOB: [birth year], 80 y.o.   MRN: [id number] Patient scheduled for lumbar epidural steroid injection on 1/6.  Imaging studies were reviewed by Drs. Deanne Coffer and [place].  Details/risks of procedure, including but not limited to, internal bleeding, infection, injury to adjacent structures, inability to eradicate back pain discussed with patient and daughter with their understanding and consent.  Lovenox will need to be held 24 hours prior to injection.

## 2020-11-23 NOTE — Care Management Important Message (Signed)
Important Message  Patient Details IM Letter given to the Patient. Name: Jorge Sherman MRN: [id number] Date of Birth: Oct 14, 1941   Medicare Important Message Given:  Yes     Caren Macadam 11/23/2020, 2:57 PM

## 2020-11-23 NOTE — Progress Notes (Signed)
Physical Therapy Treatment Patient Details Name: Jorge Sherman MRN: [id number] DOB: Mar 24, 1941 Today's Date: 11/23/2020    History of Present Illness Jorge Sherman is a 80 y.o. male with medical history significant of diet-controlled diabetes mellitus type 2, hypertension, chronic pain, OSA noncompliant with CPAP, hyperlipidemia, BPH, recent admission from 10/29/2020-11/03/2020 with fall and chest wall/flank pain along with leukocytosis, dehydration and hyponatremia treated with IV fluids and subsequent discharge home despite PT/OT recommending SNF presented with multiple episodes of vomiting.  Patient states that he has been having nausea and multiple episodes of nonbloody nonbilious vomiting since last night along with multiple episodes of watery diarrhea and midabdominal pain. He was found to have Gastroenteritis and CAP    PT Comments    Pt declines OOB activity, states "my sciatica in my back hurts" but agreeable to supine exercsies. Pt able to perform exercises, requiring rest breaks due to fatigue and pain. Assisted pt up in bed, max A to scoot up in bed and min A to roll side to side. Heavy multimodal cues for LE positioning to assist in bed mobility due to decreased UE use.. Educated pt on performing additional sets and reps of exercises throughout the day for strengthening and pt verbalizes understanding and agreement. Updated frequency to 2x/week due to high pain, limited participation with PT, and receiving OT treatment. Pt will benefit from continued physical therapy in hospital and recommendations below to increase strength, balance, endurance for safe ADLs and gait.   Follow Up Recommendations  SNF     Equipment Recommendations  None recommended by PT    Recommendations for Other Services       Precautions / Restrictions Precautions Precautions: Fall Restrictions Weight Bearing Restrictions: No    Mobility  Bed Mobility Overal bed mobility: Needs Assistance Bed  Mobility: Rolling Rolling: Min assist    General bed mobility comments: min A to roll R and L to reposition bed pad under pt, max A to scoot up in bed with cues for LE hooklying position to assist in scooting up  Transfers  General transfer comment: pt declines due to pain  Ambulation/Gait  General Gait Details: pt declines due to pain   Stairs             Wheelchair Mobility    Modified Rankin (Stroke Patients Only)       Balance             Cognition Arousal/Alertness: Awake/alert Behavior During Therapy: WFL for tasks assessed/performed Overall Cognitive Status: Within Functional Limits for tasks assessed         Exercises General Exercises - Lower Extremity Ankle Circles/Pumps: Supine;AROM;Strengthening;Both;20 reps Gluteal Sets: Supine;AROM;Strengthening;Both;10 reps Short Arc Quad: Supine;AROM;Strengthening;Both;10 reps Heel Slides: Supine;AROM;Strengthening;Both;5 reps    General Comments        Pertinent Vitals/Pain Pain Assessment: 0-10 Pain Score: 10-Worst pain ever Pain Location: "sciatica in my back" Pain Descriptors / Indicators: Aching;Grimacing;Guarding;Moaning;Sore Pain Intervention(s): Limited activity within patient's tolerance;Monitored during session;Repositioned    Home Living                      Prior Function            PT Goals (current goals can now be found in the care plan section) Acute Rehab PT Goals Patient Stated Goal: to get stronger and return to baseline PT Goal Formulation: With patient Time For Goal Achievement: 12/03/20 Potential to Achieve Goals: Fair Progress towards PT goals: Progressing toward goals  Frequency    Min 2X/week      PT Plan Frequency needs to be updated    Co-evaluation              AM-PAC PT "6 Clicks" Mobility   Outcome Measure  Help needed turning from your back to your side while in a flat bed without using bedrails?: A Lot Help needed moving from lying  on your back to sitting on the side of a flat bed without using bedrails?: A Lot Help needed moving to and from a bed to a chair (including a wheelchair)?: A Lot Help needed standing up from a chair using your arms (e.g., wheelchair or bedside chair)?: A Lot Help needed to walk in hospital room?: Total Help needed climbing 3-5 steps with a railing? : Total 6 Click Score: 10    End of Session   Activity Tolerance: Patient limited by pain Patient left: in bed;with call bell/phone within reach;with bed alarm set Nurse Communication: Mobility status PT Visit Diagnosis: Muscle weakness (generalized) (M62.81);Other abnormalities of gait and mobility (R26.89)     Time: [id number] PT Time Calculation (min) (ACUTE ONLY): 12 min  Charges:  $Therapeutic Exercise: 8-22 mins                      Tori Weslie Rasmus PT, DPT 11/23/20, 9:52 AM

## 2020-11-24 DIAGNOSIS — G894 Chronic pain syndrome: Secondary | ICD-10-CM | POA: Diagnosis not present

## 2020-11-24 DIAGNOSIS — K529 Noninfective gastroenteritis and colitis, unspecified: Secondary | ICD-10-CM | POA: Diagnosis not present

## 2020-11-24 DIAGNOSIS — I2510 Atherosclerotic heart disease of native coronary artery without angina pectoris: Secondary | ICD-10-CM | POA: Diagnosis not present

## 2020-11-24 LAB — GLUCOSE, CAPILLARY
Glucose-Capillary: 136 mg/dL — ABNORMAL HIGH (ref 70–99)
Glucose-Capillary: 232 mg/dL — ABNORMAL HIGH (ref 70–99)

## 2020-11-24 MED ORDER — SENNOSIDES-DOCUSATE SODIUM 8.6-50 MG PO TABS
1.0000 | ORAL_TABLET | Freq: Two times a day (BID) | ORAL | Status: DC
  Administered 2020-11-24 – 2020-11-28 (×9): 1 via ORAL
  Filled 2020-11-24 (×9): qty 1

## 2020-11-24 MED ORDER — POLYETHYLENE GLYCOL 3350 17 G PO PACK
17.0000 g | PACK | Freq: Every day | ORAL | Status: DC
  Administered 2020-11-25 – 2020-11-28 (×4): 17 g via ORAL
  Filled 2020-11-24 (×4): qty 1

## 2020-11-24 NOTE — Hospital Course (Addendum)
Jorge Sherman is a 80 year old male with history of diet-controlled diabetes mellitus type 2, hypertension, chronic pain syndrome, OSA noncompliant with CPAP, hyperlipidemia, BPH, recent admission from 10/29/2020-11/03/2020 with fall and chest wall/flank pain along with leukocytosis, dehydration, hyponatremia treated with IV fluids and subsequent discharge home despite PT/OT recommending SNF.   He presented with multiple episodes of vomiting along with diarrhea and abdominal pain.  He was treated with antibiotics for possible community-acquired pneumonia and Enterococcus UTI and completed antibiotic treatment.  During the hospitalization, patient's GI symptoms have improved.   He then had worsening lower extremity pain from sciatica; also has chronic pain syndrome.  PT has recommended SNF. He was also evaluated by IR and underwent epidural steroid injection to the lumbar spine on 11/26/2020. This was minimally effective and he continued to complain of ongoing chronic back pain.  We had a long discussion regarding ongoing management going forward.  He does follow with a pain clinic and has been on Suboxone however has hesitations continuing on this due to minimal effectiveness as well.  I again encouraged him to discuss this with his pain management team as options now are limited for remaining in the hospital.  He will be discharged to rehab when a bed is available.  See below for further problem based plans.

## 2020-11-24 NOTE — Progress Notes (Signed)
Jorge NOTE    PARTICK Sherman   [id number]  DOB: 11/08/1941  DOA: 11/11/2020     13  PCP: Jorge Seashore, MD  CC: pain  Hospital Course: Jorge Sherman is a 80 year old male with history of diet-controlled diabetes mellitus type 2, hypertension, chronic pain syndrome, OSA noncompliant with CPAP, hyperlipidemia, BPH, recent admission from 10/29/2020-11/03/2020 with fall and chest wall/flank pain along with leukocytosis, dehydration, hyponatremia treated with IV fluids and subsequent discharge home despite PT/OT recommending SNF.  He presented with multiple episodes of vomiting along with diarrhea and abdominal pain.  He was treated with antibiotics for possible community-acquired pneumonia and Enterococcus UTI and completed antibiotic treatment.  During the hospitalization, patient's GI symptoms have improved.  He now has worsening lower extremity pain from sciatica; also has chronic pain syndrome.  PT has recommended SNF. He was also evaluated by IR and will undergo epidural steroid injection to the lumbar spine on 11/26/2020.   Interval History:  No events overnight.  Lying in bed in no distress but still endorsing his ongoing sciatica pain and some constipation now from his pain medications.  He is requesting more constipation medicine and understands tentative plan is for steroid injection on Thursday.  Old records reviewed in assessment of this patient  ROS: Constitutional: negative for chills and fevers, Respiratory: negative for cough, Cardiovascular: negative for chest pain and Gastrointestinal: negative for abdominal pain  Assessment & Plan: Acute gastroenteritis -Presented with nausea, vomiting/diarrhea and abdominal pain.  CT abdomen and pelvis showed no acute inflammatory changes, and showed diarrheal state. -Stool for C. difficile were positive for antigen, negative for toxin: No treatment indicated.  GI pathogen PCR negative -Symptoms have resolved.  Tolerating  diet.  Worsening chronic back pain -MRI of lumbar spine from 2019 had shown severe spinal stenosis; had 5 back surgeries with multilevel disc degeneration disease.  Prior hospitalist spoke to orthopedic surgeon on call Dr. Mardelle Matte who recommended getting Depo-Medrol 80 mg IM and Toradol 30 mg IM x1.  If no improvement with his symptoms, he had recommended lumbar epidural injection by IR.  Patient received 1 more dose of Toradol on 11/22/2020 - tentative plan for ESI to L spine on 11/26/20  Headache/history of migraine -Continue as needed sumatriptan  Chronic pain syndrome -He is on multiple meds.  Continue gabapentin, duloxetine, tramadol, oxycodone, hydroxychloroquine, Robaxin - Outpatient follow-up with pain management  Chest pain -Likely musculoskeletal.  EKG unremarkable; troponin x2 were negative.  Resolved  Community-acquired pneumonia Enterococcus UTI -Treated with antibiotics and subsequently discontinued  Hypertension -Continue amlodipine and clonidine  Hyponatremia - stable - BMP daily   Diabetes mellitus type 2 with hyperglycemia -Continue CBGs with SSI  Hyperlipidemia -Continue statin  ?  History of rheumatoid arthritis -Patient is on hydroxychloroquine 200 mg twice a day  Generalized deconditioning -PT recommending SNF placement.  Social worker following   DVT prophylaxis: SCD; Lovenox on hold for ESI Code Status: DNR Family Communication: none present Disposition Plan: Status is: Inpatient  Remains inpatient appropriate because:Ongoing active pain requiring inpatient pain management, Unsafe d/c plan, IV treatments appropriate due to intensity of illness or inability to take PO and Inpatient level of care appropriate due to severity of illness   Dispo: The patient is from: Home              Anticipated d/c is to: SNF              Anticipated d/c date is: 3 days  Patient currently is not medically stable to d/c.  Objective: Blood  pressure 124/71, pulse 82, temperature 97.6 F (36.4 C), temperature source Oral, resp. rate 17, height 5\' 11"  (1.803 m), weight 109.4 kg, SpO2 96 %.  Examination: General appearance: alert, cooperative and no distress Head: Normocephalic, without obvious abnormality, atraumatic Eyes: EOMI Lungs: clear to auscultation bilaterally Heart: regular rate and rhythm and S1, S2 normal Abdomen: normal findings: bowel sounds normal and soft, non-tender Extremities: no edema Skin: mobility and turgor normal and no edema Neurologic: Grossly normal  Consultants:   IR  Procedures:     Data Reviewed: I have personally reviewed following labs and imaging studies Results for orders placed or performed during the hospital encounter of 11/11/20 (from the past 24 hour(s))  Glucose, capillary     Status: Abnormal   Collection Time: 11/23/20  4:51 PM  Result Value Ref Range   Glucose-Capillary 219 (H) 70 - 99 mg/dL  Glucose, capillary     Status: Abnormal   Collection Time: 11/24/20  7:36 AM  Result Value Ref Range   Glucose-Capillary 136 (H) 70 - 99 mg/dL  Glucose, capillary     Status: Abnormal   Collection Time: 11/24/20 11:25 AM  Result Value Ref Range   Glucose-Capillary 232 (H) 70 - 99 mg/dL    Recent Results (from the past 240 hour(s))  SARS CORONAVIRUS 2 (TAT 6-24 HRS) Nasopharyngeal Nasopharyngeal Swab     Status: None   Collection Time: 11/17/20 10:07 AM   Specimen: Nasopharyngeal Swab  Result Value Ref Range Status   SARS Coronavirus 2 NEGATIVE NEGATIVE Final    Comment: (NOTE) SARS-CoV-2 target nucleic acids are NOT DETECTED.  The SARS-CoV-2 RNA is generally detectable in upper and lower respiratory specimens during the acute phase of infection. Negative results do not preclude SARS-CoV-2 infection, do not rule out co-infections with other pathogens, and should not be used as the sole basis for treatment or other patient management decisions. Negative results must be combined  with clinical observations, patient history, and epidemiological information. The expected result is Negative.  Fact Sheet for Patients: 11/19/20  Fact Sheet for Healthcare Providers: HairSlick.no  This test is not yet approved or cleared by the [employer] FDA and  has been authorized for detection and/or diagnosis of SARS-CoV-2 by FDA under an Emergency Use Authorization (EUA). This EUA will remain  in effect (meaning this test can be used) for the duration of the COVID-19 declaration under Se ction 564(b)(1) of the Act, 21 U.S.C. section 360bbb-3(b)(1), unless the authorization is terminated or revoked sooner.  Performed at [facility], [address] [place], [place]      Radiology Studies: No results found. CT ABDOMEN PELVIS WO CONTRAST  Final Result    DG Chest Port 1 View  Final Result    IR Epidurography    (Results Pending)    Scheduled Meds: . (feeding supplement) PROSource Plus  30 mL Oral BID WC  . amLODipine  10 mg Oral Daily  . cloNIDine  0.1 mg Oral BID  . DULoxetine  30 mg Oral TID  . feeding supplement (GLUCERNA SHAKE)  237 mL Oral TID BM  . gabapentin  300 mg Oral TID  . hydroxychloroquine  200 mg Oral BID  . insulin aspart  0-15 Units Subcutaneous TID WC  . insulin aspart  0-5 Units Subcutaneous QHS  . pantoprazole  40 mg Oral Daily  . [START ON 11/25/2020] polyethylene glycol  17 g Oral  Daily  . rosuvastatin  40 mg Oral QPM  . senna-docusate  1 tablet Oral BID  . tamsulosin  0.4 mg Oral QPC supper   PRN Meds: acetaminophen, albuterol, prochlorperazine **AND** diphenhydrAMINE **AND** [EXPIRED] ketorolac **AND** dexamethasone (DECADRON) injection, diclofenac Sodium, lidocaine, loperamide, LORazepam **OR** LORazepam, methocarbamol, metoprolol tartrate, ondansetron **OR** ondansetron (ZOFRAN) IV, oxyCODONE, polyethylene glycol, traMADol Continuous Infusions:   LOS: 13  days  Time spent: Greater than 50% of the 35 minute visit was spent in counseling/coordination of care for the patient as laid out in the A&P.   Dwyane Dee, MD [facility] 11/24/2020, 3:04 PM

## 2020-11-25 DIAGNOSIS — G894 Chronic pain syndrome: Secondary | ICD-10-CM | POA: Diagnosis not present

## 2020-11-25 LAB — GLUCOSE, CAPILLARY
Glucose-Capillary: 124 mg/dL — ABNORMAL HIGH (ref 70–99)
Glucose-Capillary: 196 mg/dL — ABNORMAL HIGH (ref 70–99)
Glucose-Capillary: 192 mg/dL — ABNORMAL HIGH (ref 70–99)
Glucose-Capillary: 115 mg/dL — ABNORMAL HIGH (ref 70–99)
Glucose-Capillary: 174 mg/dL — ABNORMAL HIGH (ref 70–99)
Glucose-Capillary: 203 mg/dL — ABNORMAL HIGH (ref 70–99)
Glucose-Capillary: 186 mg/dL — ABNORMAL HIGH (ref 70–99)
Glucose-Capillary: 144 mg/dL — ABNORMAL HIGH (ref 70–99)
Glucose-Capillary: 181 mg/dL — ABNORMAL HIGH (ref 70–99)
Glucose-Capillary: 198 mg/dL — ABNORMAL HIGH (ref 70–99)
Glucose-Capillary: 319 mg/dL — ABNORMAL HIGH (ref 70–99)
Glucose-Capillary: 250 mg/dL — ABNORMAL HIGH (ref 70–99)
Glucose-Capillary: 192 mg/dL — ABNORMAL HIGH (ref 70–99)

## 2020-11-25 MED ORDER — KETOROLAC TROMETHAMINE 15 MG/ML IJ SOLN
15.0000 mg | Freq: Four times a day (QID) | INTRAMUSCULAR | Status: AC | PRN
  Administered 2020-11-25 – 2020-11-27 (×5): 15 mg via INTRAVENOUS
  Filled 2020-11-25 (×5): qty 1

## 2020-11-25 NOTE — Progress Notes (Signed)
PROGRESS NOTE    [name]   [id number]  DOB: 12-29-1940  DOA: 11/11/2020     14  PCP: Merrilee Seashore, MD  CC: pain  Hospital Course: Jorge Sherman is a 80 year old male with history of diet-controlled diabetes mellitus type 2, hypertension, chronic pain syndrome, OSA noncompliant with CPAP, hyperlipidemia, BPH, recent admission from 10/29/2020-11/03/2020 with fall and chest wall/flank pain along with leukocytosis, dehydration, hyponatremia treated with IV fluids and subsequent discharge home despite PT/OT recommending SNF.  He presented with multiple episodes of vomiting along with diarrhea and abdominal pain.  He was treated with antibiotics for possible community-acquired pneumonia and Enterococcus UTI and completed antibiotic treatment.  During the hospitalization, patient's GI symptoms have improved.  He now has worsening lower extremity pain from sciatica; also has chronic pain syndrome.  PT has recommended SNF. He was also evaluated by IR and will undergo epidural steroid injection to the lumbar spine on 11/26/2020.   Interval History:  No events overnight.  Still having pain somewhere yesterday.  He is asking for Toradol, as he states that is the only thing that helps. Otherwise comfortably resting in bed.  Old records reviewed in assessment of this patient  ROS: Constitutional: negative for chills and fevers, Respiratory: negative for cough, Cardiovascular: negative for chest pain and Gastrointestinal: negative for abdominal pain  Assessment & Plan: Acute gastroenteritis -Presented with nausea, vomiting/diarrhea and abdominal pain.  CT abdomen and pelvis showed no acute inflammatory changes, and showed diarrheal state. -Stool for C. difficile were positive for antigen, negative for toxin: No treatment indicated.  GI pathogen PCR negative -Symptoms have resolved.  Tolerating diet.  Chronic back pain -MRI of lumbar spine from 2019 had shown severe spinal stenosis;  had 5 back surgeries with multilevel disc degeneration disease.  Prior hospitalist spoke to orthopedic surgeon on call Dr. Mardelle Matte who recommended getting Depo-Medrol 80 mg IM and Toradol 30 mg IM x1.  If no improvement with his symptoms, he had recommended lumbar epidural injection by IR.  Patient received 1 more dose of Toradol on 11/22/2020 - tentative plan for ESI to L spine on 11/26/20 -Will reorder Toradol PRN for short-term use again until [facility] [place]  Headache/history of migraine -Continue as needed sumatriptan  Chronic pain syndrome -He is on multiple meds.  Continue gabapentin, duloxetine, tramadol, oxycodone, hydroxychloroquine, Robaxin - Outpatient follow-up with pain management  Chest pain -Likely musculoskeletal.  EKG unremarkable; troponin x2 were negative.  Resolved  Community-acquired pneumonia Enterococcus UTI -Treated with antibiotics and subsequently discontinued  Hypertension -Continue amlodipine and clonidine  Hyponatremia - stable - BMP daily   Diabetes mellitus type 2 with hyperglycemia -Continue CBGs with SSI  Hyperlipidemia -Continue statin  ?  History of rheumatoid arthritis -Patient is on hydroxychloroquine 200 mg twice a day  Generalized deconditioning -PT recommending SNF placement.  Social worker following   DVT prophylaxis: SCD; Lovenox on hold for ESI Code Status: DNR Family Communication: none present Disposition Plan: Status is: Inpatient  Remains inpatient appropriate because:Ongoing active pain requiring inpatient pain management, Unsafe d/c plan, IV treatments appropriate due to intensity of illness or inability to take PO and Inpatient level of care appropriate due to severity of illness   Dispo: The patient is from: Home              Anticipated d/c is to: SNF              Anticipated d/c date is: 3 days  Patient currently is not medically stable to d/c.  Objective: Blood pressure (!) 168/93, pulse 81, temperature  98.1 F (36.7 C), temperature source Oral, resp. rate 16, height 5\' 11"  (1.803 m), weight 109.4 kg, SpO2 98 %.  Examination: General appearance: alert, cooperative and no distress Head: Normocephalic, without obvious abnormality, atraumatic Eyes: EOMI Lungs: clear to auscultation bilaterally Heart: regular rate and rhythm and S1, S2 normal Abdomen: normal findings: bowel sounds normal and soft, non-tender Extremities: no edema Skin: mobility and turgor normal and no edema Neurologic: Decreased strength in lower extremities bilaterally.  Mild paresthesia per patient in right lower extremity  Consultants:   IR  Procedures:     Data Reviewed: I have personally reviewed following labs and imaging studies Results for orders placed or performed during the hospital encounter of 11/11/20 (from the past 24 hour(s))  Glucose, capillary     Status: Abnormal   Collection Time: 11/24/20  4:44 PM  Result Value Ref Range   Glucose-Capillary 144 (H) 70 - 99 mg/dL  Glucose, capillary     Status: Abnormal   Collection Time: 11/24/20  9:38 PM  Result Value Ref Range   Glucose-Capillary 181 (H) 70 - 99 mg/dL  Glucose, capillary     Status: Abnormal   Collection Time: 11/25/20  7:58 AM  Result Value Ref Range   Glucose-Capillary 198 (H) 70 - 99 mg/dL  Glucose, capillary     Status: Abnormal   Collection Time: 11/25/20 11:56 AM  Result Value Ref Range   Glucose-Capillary 319 (H) 70 - 99 mg/dL    Recent Results (from the past 240 hour(s))  SARS CORONAVIRUS 2 (TAT 6-24 HRS) Nasopharyngeal Nasopharyngeal Swab     Status: None   Collection Time: 11/17/20 10:07 AM   Specimen: Nasopharyngeal Swab  Result Value Ref Range Status   SARS Coronavirus 2 NEGATIVE NEGATIVE Final    Comment: (NOTE) SARS-CoV-2 target nucleic acids are NOT DETECTED.  The SARS-CoV-2 RNA is generally detectable in upper and lower respiratory specimens during the acute phase of infection. Negative results do not preclude  SARS-CoV-2 infection, do not rule out co-infections with other pathogens, and should not be used as the sole basis for treatment or other patient management decisions. Negative results must be combined with clinical observations, patient history, and epidemiological information. The expected result is Negative.  Fact Sheet for Patients: SugarRoll.be  Fact Sheet for Healthcare Providers: https://www.woods-mathews.com/  This test is not yet approved or cleared by the [place] FDA and  has been authorized for detection and/or diagnosis of SARS-CoV-2 by FDA under an Emergency Use Authorization (EUA). This EUA will remain  in effect (meaning this test can be used) for the duration of the COVID-19 declaration under Se ction 564(b)(1) of the Act, 21 U.S.C. section 360bbb-3(b)(1), unless the authorization is terminated or revoked sooner.  Performed at [facility], [place] [address] [place], [place] [address]      Radiology Studies: No results found. CT ABDOMEN PELVIS WO CONTRAST  Final Result    DG Chest Port 1 View  Final Result    IR Epidurography    (Results Pending)    Scheduled Meds: . (feeding supplement) PROSource Plus  30 mL Oral BID WC  . amLODipine  10 mg Oral Daily  . cloNIDine  0.1 mg Oral BID  . DULoxetine  30 mg Oral TID  . feeding supplement (GLUCERNA SHAKE)  237 mL Oral TID BM  . gabapentin  300 mg Oral TID  .  hydroxychloroquine  200 mg Oral BID  . insulin aspart  0-15 Units Subcutaneous TID WC  . insulin aspart  0-5 Units Subcutaneous QHS  . pantoprazole  40 mg Oral Daily  . polyethylene glycol  17 g Oral Daily  . rosuvastatin  40 mg Oral QPM  . senna-docusate  1 tablet Oral BID  . tamsulosin  0.4 mg Oral QPC supper   PRN Meds: acetaminophen, albuterol, prochlorperazine **AND** diphenhydrAMINE **AND** [EXPIRED] ketorolac **AND** dexamethasone (DECADRON) injection, diclofenac Sodium, ketorolac, lidocaine,  loperamide, LORazepam **OR** LORazepam, methocarbamol, metoprolol tartrate, ondansetron **OR** ondansetron (ZOFRAN) IV, oxyCODONE, polyethylene glycol, traMADol Continuous Infusions:   LOS: 14 days  Time spent: Greater than 50% of the 35 minute visit was spent in counseling/coordination of care for the patient as laid out in the A&P.   Lewie Chamber, MD [facility] 11/25/2020, 1:41 PM

## 2020-11-26 ENCOUNTER — Inpatient Hospital Stay (HOSPITAL_COMMUNITY): Payer: Medicare HMO

## 2020-11-26 DIAGNOSIS — G894 Chronic pain syndrome: Secondary | ICD-10-CM | POA: Diagnosis not present

## 2020-11-26 HISTORY — PX: IR INJECT/THERA/INC NEEDLE/CATH/PLC EPI/LUMB/SAC W/IMG: IMG6130

## 2020-11-26 LAB — GLUCOSE, CAPILLARY
Glucose-Capillary: 178 mg/dL — ABNORMAL HIGH (ref 70–99)
Glucose-Capillary: 252 mg/dL — ABNORMAL HIGH (ref 70–99)
Glucose-Capillary: 222 mg/dL — ABNORMAL HIGH (ref 70–99)
Glucose-Capillary: 202 mg/dL — ABNORMAL HIGH (ref 70–99)

## 2020-11-26 MED ORDER — DIPHENHYDRAMINE HCL 50 MG/ML IJ SOLN
50.0000 mg | Freq: Once | INTRAMUSCULAR | Status: DC
  Filled 2020-11-26: qty 1

## 2020-11-26 MED ORDER — IOPAMIDOL (ISOVUE-M 200) INJECTION 41%
INTRAMUSCULAR | Status: DC | PRN
  Administered 2020-11-26: 5 mL via EPIDURAL

## 2020-11-26 MED ORDER — ACETAMINOPHEN 500 MG PO TABS
1000.0000 mg | ORAL_TABLET | Freq: Four times a day (QID) | ORAL | Status: DC | PRN
  Filled 2020-11-26: qty 2

## 2020-11-26 MED ORDER — ADULT MULTIVITAMIN W/MINERALS CH
1.0000 | ORAL_TABLET | Freq: Every day | ORAL | Status: DC
  Administered 2020-11-26 – 2020-11-28 (×3): 1 via ORAL
  Filled 2020-11-26 (×3): qty 1

## 2020-11-26 MED ORDER — LIDOCAINE HCL (PF) 1 % IJ SOLN
INTRAMUSCULAR | Status: AC
  Filled 2020-11-26: qty 30

## 2020-11-26 MED ORDER — METHYLPREDNISOLONE ACETATE 40 MG/ML IJ SUSP
INTRAMUSCULAR | Status: AC
  Filled 2020-11-26: qty 1

## 2020-11-26 MED ORDER — DIPHENHYDRAMINE HCL 50 MG/ML IJ SOLN: 25.0000 mg | INTRAMUSCULAR | Status: DC

## 2020-11-26 MED ORDER — IOPAMIDOL (ISOVUE-M 200) INJECTION 41%
INTRAMUSCULAR | Status: AC
  Filled 2020-11-26: qty 10

## 2020-11-26 MED ORDER — METHYLPREDNISOLONE ACETATE 80 MG/ML IJ SUSP
INTRAMUSCULAR | Status: AC
  Filled 2020-11-26: qty 1

## 2020-11-26 MED ORDER — METHYLPREDNISOLONE ACETATE 80 MG/ML IJ SUSP
INTRAMUSCULAR | Status: DC | PRN
  Administered 2020-11-26: 80 mg

## 2020-11-26 MED ORDER — SODIUM CHLORIDE (PF) 0.9 % IJ SOLN
INTRAMUSCULAR | Status: AC
  Filled 2020-11-26: qty 10

## 2020-11-26 MED ORDER — ENSURE ENLIVE PO LIQD
237.0000 mL | Freq: Two times a day (BID) | ORAL | Status: DC
  Administered 2020-11-26 – 2020-11-28 (×4): 237 mL via ORAL

## 2020-11-26 MED ORDER — DIPHENHYDRAMINE HCL 50 MG/ML IJ SOLN
INTRAMUSCULAR | Status: AC
  Administered 2020-11-26: 50 mg
  Filled 2020-11-26: qty 1

## 2020-11-26 MED ORDER — LIDOCAINE HCL (PF) 1 % IJ SOLN
INTRAMUSCULAR | Status: DC | PRN
  Administered 2020-11-26: 5 mL

## 2020-11-26 NOTE — Care Management Important Message (Signed)
Important Message  Patient Details IM Letter given to the Patient. Name: FARHAAN MABEE MRN: [id number] Date of Birth: [birth year]   Medicare Important Message Given:  Yes     Caren Macadam 11/26/2020, 12:58 PM

## 2020-11-26 NOTE — Progress Notes (Signed)
PROGRESS NOTE    Jorge Sherman   [id number]  DOB: August 26, 1941  DOA: 11/11/2020     15  PCP: Merrilee Seashore, MD  CC: pain  Hospital Course: Jorge Sherman is a 80 year old male with history of diet-controlled diabetes mellitus type 2, hypertension, chronic pain syndrome, OSA noncompliant with CPAP, hyperlipidemia, BPH, recent admission from 10/29/2020-11/03/2020 with fall and chest wall/flank pain along with leukocytosis, dehydration, hyponatremia treated with IV fluids and subsequent discharge home despite PT/OT recommending SNF.  He presented with multiple episodes of vomiting along with diarrhea and abdominal pain.  He was treated with antibiotics for possible community-acquired pneumonia and Enterococcus UTI and completed antibiotic treatment.  During the hospitalization, patient's GI symptoms have improved.  He now has worsening lower extremity pain from sciatica; also has chronic pain syndrome.  PT has recommended SNF. He was also evaluated by IR and will undergo epidural steroid injection to the lumbar spine on 11/26/2020.   Interval History:  No events overnight.  Still complaining of pain in his back this morning.  Some relief from Toradol but not as much as when he previously received it he says.  He is hopeful for further relief today from the back injection.  Old records reviewed in assessment of this patient  ROS: Constitutional: negative for chills and fevers, Respiratory: negative for cough, Cardiovascular: negative for chest pain and Gastrointestinal: negative for abdominal pain  Assessment & Plan:  Chronic back pain -MRI of lumbar spine from 2019 had shown severe spinal stenosis; had 5 back surgeries with multilevel disc degeneration disease.  Prior hospitalist spoke to orthopedic surgeon on call Dr. Mardelle Matte who recommended getting Depo-Medrol 80 mg IM and Toradol 30 mg IM x1.  If no improvement with his symptoms, he had recommended lumbar epidural injection by IR.   Patient received 1 more dose of Toradol on 11/22/2020 - tentative plan for ESI to L spine on 11/26/20 -Will reorder Toradol PRN for short-term use again until ESI  Acute gastroenteritis -Presented with nausea, vomiting/diarrhea and abdominal pain.  CT abdomen and pelvis showed no acute inflammatory changes, and showed diarrheal state. -Stool for C. difficile were positive for antigen, negative for toxin: No treatment indicated.  GI pathogen PCR negative -Symptoms have resolved.  Tolerating diet.  Headache/history of migraine -Continue as needed sumatriptan  Chronic pain syndrome -He is on multiple meds.  Continue gabapentin, duloxetine, tramadol, oxycodone, hydroxychloroquine, Robaxin - Outpatient follow-up with pain management  Chest pain -Likely musculoskeletal.  EKG unremarkable; troponin x2 were negative.  Resolved  Community-acquired pneumonia Enterococcus UTI -Treated with antibiotics and subsequently discontinued  Hypertension -Continue amlodipine and clonidine  Hyponatremia - stable - BMP daily   Diabetes mellitus type 2 with hyperglycemia -Continue CBGs with SSI  Hyperlipidemia -Continue statin  ?  History of rheumatoid arthritis -Patient is on hydroxychloroquine 200 mg twice a day  Generalized deconditioning -PT recommending SNF placement.  Social worker following   DVT prophylaxis: SCD; Lovenox on hold for ESI Code Status: DNR Family Communication: none present Disposition Plan: Status is: Inpatient  Remains inpatient appropriate because:Ongoing active pain requiring inpatient pain management, Unsafe d/c plan, IV treatments appropriate due to intensity of illness or inability to take PO and Inpatient level of care appropriate due to severity of illness   Dispo: The patient is from: Home              Anticipated d/c is to: SNF              Anticipated  d/c date is: 1 day              Patient currently is not medically stable to  d/c.  Objective: Blood pressure (!) 156/89, pulse 91, temperature 98.6 F (37 C), temperature source Oral, resp. rate 18, height 5\' 11"  (1.803 m), weight 109.4 kg, SpO2 99 %.  Examination: General appearance: alert, cooperative and no distress Head: Normocephalic, without obvious abnormality, atraumatic Eyes: EOMI Lungs: clear to auscultation bilaterally Heart: regular rate and rhythm and S1, S2 normal Abdomen: normal findings: bowel sounds normal and soft, non-tender Extremities: no edema Skin: mobility and turgor normal and no edema Neurologic: Decreased strength in lower extremities bilaterally.  Mild paresthesia per patient in right lower extremity  Consultants:   IR  Procedures:     Data Reviewed: I have personally reviewed following labs and imaging studies Results for orders placed or performed during the hospital encounter of 11/11/20 (from the past 24 hour(s))  Glucose, capillary     Status: Abnormal   Collection Time: 11/25/20  5:06 PM  Result Value Ref Range   Glucose-Capillary 250 (H) 70 - 99 mg/dL  Glucose, capillary     Status: Abnormal   Collection Time: 11/25/20  9:53 PM  Result Value Ref Range   Glucose-Capillary 192 (H) 70 - 99 mg/dL  Glucose, capillary     Status: Abnormal   Collection Time: 11/26/20  7:55 AM  Result Value Ref Range   Glucose-Capillary 178 (H) 70 - 99 mg/dL  Glucose, capillary     Status: Abnormal   Collection Time: 11/26/20 12:07 PM  Result Value Ref Range   Glucose-Capillary 252 (H) 70 - 99 mg/dL    Recent Results (from the past 240 hour(s))  SARS CORONAVIRUS 2 (TAT 6-24 HRS) Nasopharyngeal Nasopharyngeal Swab     Status: None   Collection Time: 11/17/20 10:07 AM   Specimen: Nasopharyngeal Swab  Result Value Ref Range Status   SARS Coronavirus 2 NEGATIVE NEGATIVE Final    Comment: (NOTE) SARS-CoV-2 target nucleic acids are NOT DETECTED.  The SARS-CoV-2 RNA is generally detectable in upper and lower respiratory specimens during  the acute phase of infection. Negative results do not preclude SARS-CoV-2 infection, do not rule out co-infections with other pathogens, and should not be used as the sole basis for treatment or other patient management decisions. Negative results must be combined with clinical observations, patient history, and epidemiological information. The expected result is Negative.  Fact Sheet for Patients: 11/19/20  Fact Sheet for Healthcare Providers: HairSlick.no  This test is not yet approved or cleared by the quierodirigir.com FDA and  has been authorized for detection and/or diagnosis of SARS-CoV-2 by FDA under an Emergency Use Authorization (EUA). This EUA will remain  in effect (meaning this test can be used) for the duration of the COVID-19 declaration under Se ction 564(b)(1) of the Act, 21 U.S.C. section 360bbb-3(b)(1), unless the authorization is terminated or revoked sooner.  Performed at [facility], [address], [place], [place]      Radiology Studies: No results found. CT ABDOMEN PELVIS WO CONTRAST  Final Result    DG Chest Port 1 View  Final Result    IR Epidurography    (Results Pending)    Scheduled Meds: . (feeding supplement) PROSource Plus  30 mL Oral BID WC  . amLODipine  10 mg Oral Daily  . cloNIDine  0.1 mg Oral BID  . diphenhydrAMINE  25 mg Intravenous On Call  .  DULoxetine  30 mg Oral TID  . feeding supplement  237 mL Oral BID BM  . gabapentin  300 mg Oral TID  . hydroxychloroquine  200 mg Oral BID  . insulin aspart  0-15 Units Subcutaneous TID WC  . insulin aspart  0-5 Units Subcutaneous QHS  . iopamidol      . lidocaine (PF)      . methylPREDNISolone acetate      . methylPREDNISolone acetate      . multivitamin with minerals  1 tablet Oral Daily  . pantoprazole  40 mg Oral Daily  . polyethylene glycol  17 g Oral Daily  . rosuvastatin  40 mg Oral QPM  .  senna-docusate  1 tablet Oral BID  . sodium chloride (PF)      . tamsulosin  0.4 mg Oral QPC supper   PRN Meds: acetaminophen, albuterol, prochlorperazine **AND** diphenhydrAMINE **AND** [EXPIRED] ketorolac **AND** dexamethasone (DECADRON) injection, diclofenac Sodium, ketorolac, lidocaine, loperamide, LORazepam **OR** LORazepam, methocarbamol, metoprolol tartrate, ondansetron **OR** ondansetron (ZOFRAN) IV, oxyCODONE, polyethylene glycol, traMADol Continuous Infusions:   LOS: 15 days  Time spent: Greater than 50% of the 35 minute visit was spent in counseling/coordination of care for the patient as laid out in the A&P.   Dwyane Dee, MD [facility] 11/26/2020, 1:10 PM

## 2020-11-26 NOTE — Progress Notes (Addendum)
Nutrition Follow-up  DOCUMENTATION CODES:   Obesity unspecified  INTERVENTION:   -D/c Glucerna Shake po TID, each supplement provides 220 kcal and 10 grams of protein -Ensure Enlive po BID, each supplement provides 350 kcal and 20 grams of protein -MVI with minerals daily -Continue 30 ml Prosource Plus BID, each supplement provides 100 kcals and 15 grams protein  NUTRITION DIAGNOSIS:   Inadequate oral intake related to nausea,vomiting,diarrhea as evidenced by per patient/family report.  Ongoing  GOAL:   Patient will meet greater than or equal to 90% of their needs  Progressing   MONITOR:   PO intake,Supplement acceptance,Diet advancement,Labs,Weight trends,I & O's  REASON FOR ASSESSMENT:   Malnutrition Screening Tool    ASSESSMENT:   80 y.o. male with medical history significant of diet-controlled diabetes mellitus type 2, hypertension, chronic pain, OSA noncompliant with CPAP, hyperlipidemia, BPH, recent admission from 10/29/2020-11/03/2020 with fall and chest wall/flank pain along with leukocytosis, dehydration and hyponatremia treated with IV fluids and subsequent discharge home despite PT/OT recommending SNF presented with multiple episodes of vomiting.  Patient states that he has been having nausea and multiple episodes of nonbloody nonbilious vomiting since last night along with multiple episodes of watery diarrhea and midabdominal pain.  Reviewed I/O's: -2 L x 24 hours and -8.4 L since 11/12/20  UOP: 4.2 L x 24 hours  Pt unavailable at time of attempted visit.   Per MD notes, plan for epidural steroid injection to lumbar spine today.   Pt remains with erratic oral intake. Noted meal completions 0-100%. Pt is consuming Glucerna and Prosource Plus supplements per MAR.   Reviewed wt hx; pt has experienced a 1.9% wt loss over the past week. While this is not significant for time frame, it is concerning given poor oral intake.   Pt with poor oral intake and would  benefit from nutrient dense supplement. One Ensure Enlive supplement provides 350 kcals, 20 grams protein, and 44-45 grams of carbohydrate vs one Glucerna shake supplement, which provides 220 kcals, 10 grams of protein, and 26 grams of carbohydrate. Given pt's hx of DM, RD will reassess adequacy of PO intake, CBGS, and adjust supplement regimen as appropriate at follow-up.   Medications reviewed and include miralax and senokot.   Per TOC notes, plan for SNF placement at discharge.   Labs reviewed: CBGS: 178-319 (inpatient orders for glycemic control are 0-15 units insulin aspart TID with meals and0-5 units insulin aspart daily at bedtime).   Diet Order:   Diet Order            DIET SOFT Room service appropriate? Yes; Fluid consistency: Thin  Diet effective now                 EDUCATION NEEDS:   No education needs have been identified at this time  Skin:  Skin Assessment: Reviewed RN Assessment  Last BM:  11/24/20  Height:   Ht Readings from Last 1 Encounters:  11/11/20 5\' 11"  (1.803 m)    Weight:   Wt Readings from Last 1 Encounters:  11/21/20 109.4 kg   BMI:  Body mass index is 33.64 kg/m.  Estimated Nutritional Needs:   Kcal:  2100-2300  Protein:  90-100g  Fluid:  2.1L/day    01-23-1991, RD, LDN, CDCES Registered Dietitian II Certified Diabetes Care and Education Specialist Please refer to [facility] for RD and/or RD on-call/weekend/after hours pager

## 2020-11-26 NOTE — Progress Notes (Signed)
Pt c/o burning upon urination. Dr Frederick Peers aware. Order received for UA.

## 2020-11-26 NOTE — Procedures (Signed)
  Procedure: Caudal epidural injection 80mg  depomedrol EBL:   minimal Complications:  none immediate  See full dictation in .  [employer] MD Main # [phone] Pager  [phone] Mobile [phone]

## 2020-11-26 NOTE — Progress Notes (Signed)
Physical Therapy Treatment Patient Details Name: Jorge Sherman MRN: [id number] DOB: [birth year] Today's Date: 11/26/2020    History of Present Illness Jorge Sherman is a 80 y.o. male with medical history significant of diet-controlled diabetes mellitus type 2, hypertension, chronic pain, OSA noncompliant with CPAP, hyperlipidemia, BPH, recent admission from 10/29/2020-11/03/2020 with fall and chest wall/flank pain along with leukocytosis, dehydration and hyponatremia treated with IV fluids and subsequent discharge home despite PT/OT recommending SNF presented with multiple episodes of vomiting.  Patient states that he has been having nausea and multiple episodes of nonbloody nonbilious vomiting since last night along with multiple episodes of watery diarrhea and midabdominal pain. He was found to have Gastroenteritis and CAP    PT Comments    Pt declines OOB mobility due to high pain, educated pt on skilled PT interventions to improve mobility, strength, reduce pain but pt politely declines getting out of bed until after steroid injection. Pt agreeable to BLE strengthening exercises, upon removing blanket pt noted to be soiled. Cued pt with rolling R/L for nurse tech to assist pt with pericare, cued for hand placement and hooklying position of LE to assist in rotating trunk. Educated pt on avoiding valsalva maneuver with mobility and exercises to improve pain with fair carryover, requiring reminders intermittently throughout session. Pt performs BLE strengthening exercises with increased pain requiring therapeutic rest breaks to recover. Pt will benefit from continued physical therapy in hospital and recommendations below to increase strength, balance, endurance for safe ADLs and gait.   Follow Up Recommendations  SNF     Equipment Recommendations  None recommended by PT    Recommendations for Other Services       Precautions / Restrictions Precautions Precautions: Fall Precaution  Comments: high LBP Restrictions Weight Bearing Restrictions: No    Mobility  Bed Mobility Overal bed mobility: Needs Assistance Bed Mobility: Rolling Rolling: Min assist  General bed mobility comments: min A to roll R and L with cosntant verbal cues for hand placement on bedrail and assisting with LE in hooklying position, cued to scoot up in bed through hooklying position requiring mod A to scoot up  Transfers  General transfer comment: pt declines due to pain  Ambulation/Gait  General Gait Details: pt declines due to pain   Stairs             Wheelchair Mobility    Modified Rankin (Stroke Patients Only)       Balance             Cognition Arousal/Alertness: Awake/alert Behavior During Therapy: WFL for tasks assessed/performed Overall Cognitive Status: Within Functional Limits for tasks assessed             Exercises General Exercises - Lower Extremity Quad Sets: Supine;AROM;Strengthening;Both;10 reps Gluteal Sets: Supine;AROM;Strengthening;Both;10 reps Short Arc Quad: Supine;AROM;Strengthening;Both;10 reps Heel Slides: Supine;AROM;Strengthening;Both;5 reps Hip ABduction/ADduction: Supine;AROM;Strengthening;Both;5 reps    General Comments        Pertinent Vitals/Pain Pain Assessment: 0-10 Pain Score: 10-Worst pain ever ("15 out of 10") Pain Location: "sciatica in my back" Pain Descriptors / Indicators: Aching;Grimacing;Guarding;Moaning;Sore Pain Intervention(s): Limited activity within patient's tolerance;Monitored during session;Repositioned    Home Living                      Prior Function            PT Goals (current goals can now be found in the care plan section) Acute Rehab PT Goals Patient Stated Goal: to get stronger and  return to baseline PT Goal Formulation: With patient Time For Goal Achievement: 12/03/20 Potential to Achieve Goals: Fair Progress towards PT goals: Progressing toward goals (limited by pain)     Frequency    Min 2X/week      PT Plan Current plan remains appropriate    Co-evaluation              AM-PAC PT "6 Clicks" Mobility   Outcome Measure  Help needed turning from your back to your side while in a flat bed without using bedrails?: A Lot Help needed moving from lying on your back to sitting on the side of a flat bed without using bedrails?: A Lot Help needed moving to and from a bed to a chair (including a wheelchair)?: A Lot Help needed standing up from a chair using your arms (e.g., wheelchair or bedside chair)?: A Lot Help needed to walk in hospital room?: Total Help needed climbing 3-5 steps with a railing? : Total 6 Click Score: 10    End of Session   Activity Tolerance: Patient limited by pain Patient left: in bed;with call bell/phone within reach;with bed alarm set Nurse Communication: Mobility status PT Visit Diagnosis: Muscle weakness (generalized) (M62.81);Other abnormalities of gait and mobility (R26.89)     Time: 1140-1210 PT Time Calculation (min) (ACUTE ONLY): 30 min  Charges:  $Therapeutic Exercise: 8-22 mins $Therapeutic Activity: 8-22 mins                      Tori Bentzion Dauria PT, DPT 11/26/20, 12:26 PM

## 2020-11-27 DIAGNOSIS — G894 Chronic pain syndrome: Secondary | ICD-10-CM | POA: Diagnosis not present

## 2020-11-27 LAB — GLUCOSE, CAPILLARY
Glucose-Capillary: 165 mg/dL — ABNORMAL HIGH (ref 70–99)
Glucose-Capillary: 189 mg/dL — ABNORMAL HIGH (ref 70–99)
Glucose-Capillary: 210 mg/dL — ABNORMAL HIGH (ref 70–99)
Glucose-Capillary: 134 mg/dL — ABNORMAL HIGH (ref 70–99)

## 2020-11-27 LAB — SARS CORONAVIRUS 2 (TAT 6-24 HRS): SARS Coronavirus 2: NEGATIVE

## 2020-11-27 MED ORDER — ENOXAPARIN SODIUM 40 MG/0.4ML ~~LOC~~ SOLN
40.0000 mg | SUBCUTANEOUS | Status: DC
  Administered 2020-11-27: 40 mg via SUBCUTANEOUS
  Filled 2020-11-27: qty 0.4

## 2020-11-27 MED ORDER — MORPHINE SULFATE (PF) 2 MG/ML IV SOLN
2.0000 mg | Freq: Once | INTRAVENOUS | Status: AC
  Administered 2020-11-27: 2 mg via INTRAVENOUS
  Filled 2020-11-27: qty 1

## 2020-11-27 MED ORDER — MORPHINE SULFATE (PF) 2 MG/ML IV SOLN
2.0000 mg | Freq: Once | INTRAVENOUS | Status: AC
  Administered 2020-11-27: 11:00:00 2 mg via INTRAVENOUS
  Filled 2020-11-27: qty 1

## 2020-11-27 NOTE — TOC Progression Note (Signed)
Transition of Care [facility]) - Progression Note    Patient Details  Name: Jorge Sherman MRN: [id number] Date of Birth: August 07, 1941  [facility] of [facility] CM/SW Contact  Lennart Pall, LCSW Phone Number: 11/27/2020, 10:19 AM  Clinical Narrative:    Have resubmitted FL2 to all area SNFs now that IR procedure completed.  Will review new offers with pt/ daughter when received today.   Expected Discharge Plan: Skilled Nursing Facility Barriers to Discharge: Insurance Authorization,Other (comment) (covid test)  Expected Discharge Plan and Services Expected Discharge Plan: [place] In-house Referral: NA Discharge Planning Services: NA Post Acute Care Choice: [place] Living arrangements for the past 2 months: Apartment                 DME Arranged: N/A DME Agency: NA       HH Arranged: NA HH Agency: NA         Social Determinants of Health (SDOH) Interventions    Readmission Risk Interventions Readmission Risk Prevention Plan 11/17/2020 06/29/2020  Transportation Screening Complete Complete  Home Care Screening - Complete  Medication Review (RN CM) - Complete  Medication Review Press photographer) Complete -  PCP or Specialist appointment within 3-5 days of discharge Complete -  SW Recovery Care/Counseling Consult Complete -  [place] Complete -  Some recent data might be hidden

## 2020-11-27 NOTE — TOC Progression Note (Signed)
Transition of Care [facility]) - Progression Note    Patient Details  Name: Jorge Sherman MRN: [id number] Date of Birth: May 14, 1941  Transition of Care [facility]) CM/SW Contact  Lennart Pall, LCSW Phone Number: 11/27/2020, 2:30 PM  Clinical Narrative:    Have received 2 SNF bed offers and pt has reluctantly accepted bed at [facility].  Have notified daughter who is very upset that the facility isn't closer.  Explained to pt and daughter that we need to move forward now that we have confirmed bed offers and encouraged her to continue to reach out to her preferred locations (they all have his information) to see if he might be able to transfer at a later time.  Unfortunately, his unvaccinated status is limiting which facilities can offer beds.  Have begun insurance authorization and hope to be able to transfer pt to facility by tomorrow.   Expected Discharge Plan: Skilled Nursing Facility Barriers to Discharge: Insurance Authorization,Other (comment) (covid test)  Expected Discharge Plan and Services Expected Discharge Plan: [place] In-house Referral: NA Discharge Planning Services: NA Post Acute Care Choice: [place] Living arrangements for the past 2 months: Apartment                 DME Arranged: N/A DME Agency: NA       HH Arranged: NA HH Agency: NA         Social Determinants of Health (SDOH) Interventions    Readmission Risk Interventions Readmission Risk Prevention Plan 11/17/2020 06/29/2020  Transportation Screening Complete Complete  Home Care Screening - Complete  Medication Review (RN CM) - Complete  Medication Review Press photographer) Complete -  PCP or Specialist appointment within 3-5 days of discharge Complete -  SW Recovery Care/Counseling Consult Complete -  [place] Complete -  Some recent data might be hidden

## 2020-11-27 NOTE — Progress Notes (Signed)
PROGRESS NOTE    Jorge Sherman   ZOX:[id number]  DOB: 1941/06/24  DOA: 11/11/2020     16  PCP: Merrilee Seashore, MD  CC: pain  Hospital Course: Jorge Sherman is a 80 year old male with history of diet-controlled diabetes mellitus type 2, hypertension, chronic pain syndrome, OSA noncompliant with CPAP, hyperlipidemia, BPH, recent admission from 10/29/2020-11/03/2020 with fall and chest wall/flank pain along with leukocytosis, dehydration, hyponatremia treated with IV fluids and subsequent discharge home despite PT/OT recommending SNF.  He presented with multiple episodes of vomiting along with diarrhea and abdominal pain.  He was treated with antibiotics for possible community-acquired pneumonia and Enterococcus UTI and completed antibiotic treatment.  During the hospitalization, patient's GI symptoms have improved.  He now has worsening lower extremity pain from sciatica; also has chronic pain syndrome.  PT has recommended SNF. He was also evaluated by IR and underwent epidural steroid injection to the lumbar spine on 11/26/2020. This was minimally effective and he continued to complain of ongoing chronic back pain.  We had a long discussion regarding ongoing management going forward.  He does follow with a pain clinic and has been on Suboxone however has hesitations continuing on this due to minimal effectiveness as well.  I again encouraged him to discuss this with his pain management team as options now are limited for remaining in the hospital.  He will be discharged to rehab when a bed is available.   Interval History:  No events overnight.  Had the [facility] yesterday which went technically well however he states upon returning to his room after the procedure he had severe back pain and required a dose of morphine.  This morning he is also requesting a repeat dose of morphine as his pain is a 9/10.  Discussed with him that his options are limited for preparing for discharge in terms of chronic  pain management.  He understands the need to follow-up with his pain management clinic at discharge as he has not filled Suboxone since August 2021.  Old records reviewed in assessment of this patient  ROS: Constitutional: negative for chills and fevers, Respiratory: negative for cough, Cardiovascular: negative for chest pain and Gastrointestinal: negative for abdominal pain  Assessment & Plan:  Chronic back pain -MRI of lumbar spine from 2019 had shown severe spinal stenosis; had 5 back surgeries with multilevel disc degeneration disease.  Prior hospitalist spoke to orthopedic surgeon on call Dr. Mardelle Matte who recommended getting Depo-Medrol 80 mg IM and Toradol 30 mg IM x1.  If no improvement with his symptoms, he had recommended lumbar epidural injection by IR.  Patient received 1 more dose of Toradol on 11/22/2020 -Underwent ESI to L spine on 11/26/20 -Continue Toradol for now -One-time dose of morphine being given this morning -Patient will need to follow-up with pain clinic outpatient; database reviewed, last fill of Suboxone on 06/26/2020 for a 30-day supply  Acute gastroenteritis -resolved -Presented with nausea, vomiting/diarrhea and abdominal pain.  CT abdomen and pelvis showed no acute inflammatory changes, and showed diarrheal state. -Stool for C. difficile were positive for antigen, negative for toxin: No treatment indicated.  GI pathogen PCR negative -Symptoms have resolved.  Tolerating diet.  Headache/history of migraine -Continue as needed sumatriptan  Chronic pain syndrome -He is on multiple meds.  Continue gabapentin, duloxetine, tramadol, oxycodone, hydroxychloroquine, Robaxin - Outpatient follow-up with pain management  Chest pain -Likely musculoskeletal.  EKG unremarkable; troponin x2 were negative.  Resolved  Community-acquired pneumonia - resolved Enterococcus UTI - resolved  -  Treated with antibiotics and subsequently discontinued  Hypertension -Continue  amlodipine and clonidine  Hyponatremia - stable - BMP daily   Diabetes mellitus type 2 with hyperglycemia -Continue CBGs with SSI  Hyperlipidemia -Continue statin  ?  History of rheumatoid arthritis -Patient is on hydroxychloroquine 200 mg twice a day  Generalized deconditioning -PT recommending SNF placement.  Social worker following   DVT prophylaxis: Lovenox  Code Status: DNR Family Communication: none present Disposition Plan: Status is: Inpatient  Remains inpatient appropriate because:Ongoing active pain requiring inpatient pain management, Unsafe d/c plan, IV treatments appropriate due to intensity of illness or inability to take PO and Inpatient level of care appropriate due to severity of illness   Dispo: The patient is from: Home              Anticipated d/c is to: SNF              Anticipated d/c date is: 1 day              Patient currently is not medically stable to d/c.  Objective: Blood pressure (!) 150/87, pulse 83, temperature 98.2 F (36.8 C), temperature source Oral, resp. rate 16, height 5\' 11"  (1.803 m), weight 109.4 kg, SpO2 99 %.  Examination: General appearance: alert, cooperative and no distress Head: Normocephalic, without obvious abnormality, atraumatic Eyes: EOMI Lungs: clear to auscultation bilaterally Heart: regular rate and rhythm and S1, S2 normal Abdomen: normal findings: bowel sounds normal and soft, non-tender Extremities: no edema Skin: mobility and turgor normal and no edema Neurologic: Decreased strength in lower extremities bilaterally.  Mild paresthesia per patient in right lower extremity  Consultants:   IR  Procedures:     Data Reviewed: I have personally reviewed following labs and imaging studies Results for orders placed or performed during the hospital encounter of 11/11/20 (from the past 24 hour(s))  Glucose, capillary     Status: Abnormal   Collection Time: 11/26/20  5:23 PM  Result Value Ref Range    Glucose-Capillary 222 (H) 70 - 99 mg/dL  Glucose, capillary     Status: Abnormal   Collection Time: 11/26/20  8:14 PM  Result Value Ref Range   Glucose-Capillary 202 (H) 70 - 99 mg/dL  Glucose, capillary     Status: Abnormal   Collection Time: 11/27/20  7:49 AM  Result Value Ref Range   Glucose-Capillary 165 (H) 70 - 99 mg/dL  Glucose, capillary     Status: Abnormal   Collection Time: 11/27/20 11:43 AM  Result Value Ref Range   Glucose-Capillary 189 (H) 70 - 99 mg/dL    No results found for this or any previous visit (from the past 240 hour(s)).   Radiology Studies: IR INJECT DIAG/THERA/INC NEEDLE/CATH/PLC EPI/LUMB/SAC W/IMG  Result Date: 11/26/2020 CLINICAL DATA:  Severe low back and left lower extremity pain. 8 previous lumbar surgeries including cement augmentation L1, instrumented posterior fusion L1-sacrum. EXAM: CAUDAL EPIDURAL INJECTION UNDER FLUOROSCOPIC GUIDANCE FLUOROSCOPY TIME:  24 seconds; 25 mGy PROCEDURE: After a thorough discussion of alternatives, risks and benefits of the procedure including bleeding, infection, injury to nerves, blood vessels, and adjacent structures, written and oral informed consent was obtained. Consent was obtained. Specific risks to this procedure included injury to the terminal portion of the thecal sac and/or nerve roots. Utilizing a caudal approach, the skin overlying the sacral hiatus was cleansed and anesthetized. A 20 gauge Crawford epidural needle was advanced into the sacral epidural space. Injection of Omnipaque 180 shows a good epidural  pattern with spread up to L5-S1, predominantly left-sided. No vascular or subarachnoid opacification is seen. 80 mg of Depo-Medrol mixed with 5cc of 1% Lidocaine were instilled. The procedure was well-tolerated, and the patient was discharged thirty minutes following the injection in good condition. IMPRESSION: Technically successful  caudal epidural injection. Electronically Signed   By: Lucrezia Europe M.D.   On:  11/26/2020 14:27   IR INJECT DIAG/THERA/INC NEEDLE/CATH/PLC EPI/LUMB/SAC W/IMG  Final Result    CT ABDOMEN PELVIS WO CONTRAST  Final Result    DG Chest Port 1 View  Final Result      Scheduled Meds: . (feeding supplement) PROSource Plus  30 mL Oral BID WC  . amLODipine  10 mg Oral Daily  . cloNIDine  0.1 mg Oral BID  . diphenhydrAMINE  50 mg Intravenous Once  . DULoxetine  30 mg Oral TID  . feeding supplement  237 mL Oral BID BM  . gabapentin  300 mg Oral TID  . hydroxychloroquine  200 mg Oral BID  . insulin aspart  0-15 Units Subcutaneous TID WC  . insulin aspart  0-5 Units Subcutaneous QHS  . multivitamin with minerals  1 tablet Oral Daily  . pantoprazole  40 mg Oral Daily  . polyethylene glycol  17 g Oral Daily  . rosuvastatin  40 mg Oral QPM  . senna-docusate  1 tablet Oral BID  . tamsulosin  0.4 mg Oral QPC supper   PRN Meds: acetaminophen, acetaminophen, albuterol, prochlorperazine **AND** diphenhydrAMINE **AND** [EXPIRED] ketorolac **AND** dexamethasone (DECADRON) injection, diclofenac Sodium, iopamidol, ketorolac, lidocaine, lidocaine (PF), loperamide, LORazepam **OR** LORazepam, methocarbamol, methylPREDNISolone acetate, metoprolol tartrate, ondansetron **OR** ondansetron (ZOFRAN) IV, oxyCODONE, polyethylene glycol, traMADol Continuous Infusions:   LOS: 16 days  Time spent: Greater than 50% of the 35 minute visit was spent in counseling/coordination of care for the patient as laid out in the A&P.   Dwyane Dee, MD [facility] 11/27/2020, 12:14 PM

## 2020-11-27 NOTE — Plan of Care (Signed)

## 2020-11-28 DIAGNOSIS — G894 Chronic pain syndrome: Secondary | ICD-10-CM | POA: Diagnosis not present

## 2020-11-28 DIAGNOSIS — K529 Noninfective gastroenteritis and colitis, unspecified: Secondary | ICD-10-CM | POA: Diagnosis not present

## 2020-11-28 LAB — GLUCOSE, CAPILLARY
Glucose-Capillary: 147 mg/dL — ABNORMAL HIGH (ref 70–99)
Glucose-Capillary: 151 mg/dL — ABNORMAL HIGH (ref 70–99)

## 2020-11-28 MED ORDER — DULOXETINE HCL 30 MG PO CPEP: 30.0000 mg | ORAL_CAPSULE | Freq: Three times a day (TID) | ORAL | 3 refills | Status: DC

## 2020-11-28 MED ORDER — ADULT MULTIVITAMIN W/MINERALS CH: 1.0000 | ORAL_TABLET | Freq: Every day | ORAL | Status: AC

## 2020-11-28 MED ORDER — OXYCODONE HCL 5 MG PO TABS: 5.0000 mg | ORAL_TABLET | Freq: Four times a day (QID) | ORAL | 0 refills | Status: DC | PRN

## 2020-11-28 MED ORDER — METHOCARBAMOL 500 MG PO TABS: 500.0000 mg | ORAL_TABLET | Freq: Four times a day (QID) | ORAL | Status: DC | PRN

## 2020-11-28 MED ORDER — PANTOPRAZOLE SODIUM 40 MG PO TBEC: 40.0000 mg | DELAYED_RELEASE_TABLET | Freq: Every day | ORAL | Status: AC

## 2020-11-28 MED ORDER — GABAPENTIN 300 MG PO CAPS: 300.0000 mg | ORAL_CAPSULE | Freq: Three times a day (TID) | ORAL | Status: DC

## 2020-11-28 MED ORDER — MAGNESIUM CITRATE PO SOLN
1.0000 | Freq: Once | ORAL | Status: AC
  Administered 2020-11-28: 1 via ORAL
  Filled 2020-11-28: qty 296

## 2020-11-28 MED ORDER — AMLODIPINE BESYLATE 10 MG PO TABS: 10.0000 mg | ORAL_TABLET | Freq: Every day | ORAL | Status: DC

## 2020-11-28 MED ORDER — LORAZEPAM 1 MG PO TABS: 1.0000 mg | ORAL_TABLET | Freq: Three times a day (TID) | ORAL | 0 refills | Status: DC | PRN

## 2020-11-28 MED ORDER — ACETAMINOPHEN 500 MG PO TABS: 1000.0000 mg | ORAL_TABLET | Freq: Four times a day (QID) | ORAL | 0 refills | Status: AC | PRN

## 2020-11-28 MED ORDER — MORPHINE SULFATE (PF) 2 MG/ML IV SOLN
2.0000 mg | Freq: Once | INTRAVENOUS | Status: AC | PRN
Start: 2020-11-28 — End: 2020-11-28
  Administered 2020-11-28: 2 mg via INTRAMUSCULAR
  Filled 2020-11-28: qty 1

## 2020-11-28 NOTE — Discharge Summary (Signed)
Physician Discharge Summary   Jorge Sherman ZOX:[id number] DOB: 01-30-1941 DOA: 11/11/2020  PCP: Merrilee Seashore, MD  Admit date: 11/11/2020 Discharge date: 11/28/2020  Admitted From: home Disposition:  SNF Discharging physician: Dwyane Dee, MD  Recommendations for Outpatient Follow-up:  1. Patient needs to follow up with pain clinic for ongoing chronic management. Has seen [facility] in the past   Patient discharged to SNF in Discharge Condition: stable CODE STATUS: DNR Diet recommendation:  Diet Orders (From admission, onward)    Start     Ordered   11/13/20 0856  DIET SOFT Room service appropriate? Yes; Fluid consistency: Thin  Diet effective now       Question Answer Comment  Room service appropriate? Yes   Fluid consistency: Thin      11/13/20 0855          Hospital Course: Jorge Sherman is a 80 year old male with history of diet-controlled diabetes mellitus type 2, hypertension, chronic pain syndrome, OSA noncompliant with CPAP, hyperlipidemia, BPH, recent admission from 10/29/2020-11/03/2020 with fall and chest wall/flank pain along with leukocytosis, dehydration, hyponatremia treated with IV fluids and subsequent discharge home despite PT/OT recommending SNF.   He presented with multiple episodes of vomiting along with diarrhea and abdominal pain.  He was treated with antibiotics for possible community-acquired pneumonia and Enterococcus UTI and completed antibiotic treatment.  During the hospitalization, patient's GI symptoms have improved.   He then had worsening lower extremity pain from sciatica; also has chronic pain syndrome.  PT has recommended SNF. He was also evaluated by IR and underwent epidural steroid injection to the lumbar spine on 11/26/2020. This was minimally effective and he continued to complain of ongoing chronic back pain.  We had a long discussion regarding ongoing management going forward.  He does follow with a pain clinic and has been on  Suboxone however has hesitations continuing on this due to minimal effectiveness as well.  I again encouraged him to discuss this with his pain management team as options now are limited for remaining in the hospital.  He will be discharged to rehab when a bed is available.  See below for further problem based plans.   Chronic back pain -MRI of lumbar spine from 2019 had shown severe spinal stenosis; had 5 back surgeries with multilevel disc degeneration disease. Prior hospitalist spoke to orthopedic surgeon on call Dr. Mardelle Matte who recommended getting Depo-Medrol 80 mg IM and Toradol 30 mg IM x1. If no improvement with his symptoms, he had recommended lumbar epidural injection by IR. Patient received 1 more dose of Toradol on 11/22/2020 -Underwent ESI to L spine on 11/26/20 -Patient will need to follow-up with pain clinic outpatient; database reviewed, last fill of Suboxone on 06/26/2020 for a 30-day supply  Chronic pain syndrome -He is on multiple meds. Continue gabapentin, duloxetine, tramadol, oxycodone, hydroxychloroquine, Robaxin - Outpatient follow-up with pain management  Acute gastroenteritis -resolved -Presented with nausea, vomiting/diarrhea and abdominal pain. CT abdomen and pelvis showed no acute inflammatory changes, and showed diarrheal state. -Stool for C. difficile were positive for antigen, negative for toxin: No treatment indicated. GI pathogen PCR negative -Symptoms have resolved. Tolerating diet.  Headache/history of migraine -Continue as needed sumatriptan  Chest pain -Likely musculoskeletal. EKG unremarkable; troponin x2 were negative. Resolved  Community-acquired pneumonia - resolved Enterococcus UTI - resolved  -Treated with antibiotics and subsequently discontinued  Hypertension -Continue amlodipine and clonidine  Hyponatremia - stable  Diabetes mellitus type 2 with hyperglycemia -Continue CBGs with SSI  Hyperlipidemia -Continue  statin  ?  History of rheumatoid arthritis -Patient is on hydroxychloroquine 200 mg twice a day  Generalized deconditioning -PT recommending SNF placement.    The patient's chronic medical conditions were treated accordingly per the patient's home medication regimen except as noted.  On day of discharge, patient was felt deemed stable for discharge. Patient/family member advised to call PCP or come back to ER if needed.   Principal Diagnosis: Gastroenteritis  Discharge Diagnoses: Active Hospital Problems   Diagnosis Date Noted  . Hyponatremia 11/11/2020  . Essential hypertension, benign 10/30/2013  . Coronary artery disease involving native coronary artery of native heart without angina pectoris 10/30/2013  . Obstructive sleep apnea   . Chronic pain syndrome     Resolved Hospital Problems   Diagnosis Date Noted Date Resolved  . Gastroenteritis 11/11/2020 11/28/2020  . Abdominal pain 11/11/2020 11/28/2020  . Leukocytosis  11/28/2020    Discharge Instructions    Increase activity slowly   Complete by: As directed      Allergies as of 11/28/2020      Reactions   Cefepime Rash   Other reaction(s): Other (See Comments), redness   Iohexol Hives, Other (See Comments)   A few hives post CT Other reaction(s): hives, few, post CT   Fentanyl Itching, Nausea And Vomiting, Other (See Comments)   Patient states that it happened over 30 yrs ago   Iodine Rash   Other reaction(s): Hives/Skin Rash      Medication List    STOP taking these medications   celecoxib 200 MG capsule Commonly known as: CELEBREX   FUSION PLUS PO   gabapentin 800 MG tablet Commonly known as: NEURONTIN Replaced by: gabapentin 300 MG capsule   Narcan 4 MG/0.1ML Liqd nasal spray kit Generic drug: naloxone   omeprazole 40 MG capsule Commonly known as: PRILOSEC Replaced by: pantoprazole 40 MG tablet   oxyCODONE-acetaminophen 5-325 MG tablet Commonly known as: PERCOCET/ROXICET   saccharomyces boulardii 250  MG capsule Commonly known as: FLORASTOR     TAKE these medications   acetaminophen 500 MG tablet Commonly known as: TYLENOL Take 2 tablets (1,000 mg total) by mouth every 6 (six) hours as needed for headache, mild pain or fever. What changed:   medication strength  how much to take  reasons to take this   albuterol 108 (90 Base) MCG/ACT inhaler Commonly known as: VENTOLIN HFA Inhale 2 puffs into the lungs every 4 (four) hours as needed for wheezing or shortness of breath.   amLODipine 10 MG tablet Commonly known as: NORVASC Take 1 tablet (10 mg total) by mouth daily. Start taking on: November 29, 2020   cloNIDine 0.1 MG tablet Commonly known as: CATAPRES Take 0.1 mg by mouth 2 (two) times daily.   diclofenac Sodium 1 % Gel Commonly known as: VOLTAREN Apply 2 g topically 4 (four) times daily as needed for pain.   DULoxetine 30 MG capsule Commonly known as: CYMBALTA Take 1 capsule (30 mg total) by mouth 3 (three) times daily. What changed:   how much to take  when to take this   ferrous sulfate 325 (65 FE) MG tablet Take 325 mg by mouth daily.   gabapentin 300 MG capsule Commonly known as: NEURONTIN Take 1 capsule (300 mg total) by mouth 3 (three) times daily. Replaces: gabapentin 800 MG tablet   hydroxychloroquine 200 MG tablet Commonly known as: PLAQUENIL Take 200 mg by mouth 2 (two) times daily.   lidocaine 5 % Commonly known as: LIDODERM Place 1  patch onto the skin daily. Remove & Discard patch within 12 hours or as directed by MD. Right chest   linaclotide 72 MCG capsule Commonly known as: LINZESS Take 72 mcg by mouth daily as needed (constipation).   loperamide 2 MG tablet Commonly known as: Imodium A-D Take 1 tablet (2 mg total) by mouth 4 (four) times daily as needed for diarrhea or loose stools.   LORazepam 1 MG tablet Commonly known as: ATIVAN Take 1 tablet (1 mg total) by mouth every 8 (eight) hours as needed for anxiety. What changed: how  much to take   methocarbamol 500 MG tablet Commonly known as: ROBAXIN Take 1 tablet (500 mg total) by mouth every 6 (six) hours as needed for muscle spasms. What changed:   how much to take  when to take this  reasons to take this   multivitamin with minerals Tabs tablet Take 1 tablet by mouth daily. Start taking on: November 29, 2020   ondansetron 4 MG tablet Commonly known as: ZOFRAN Take 1 tablet (4 mg total) by mouth every 6 (six) hours as needed for nausea.   oxyCODONE 5 MG immediate release tablet Commonly known as: Oxy IR/ROXICODONE Take 1 tablet (5 mg total) by mouth every 6 (six) hours as needed for severe pain.   pantoprazole 40 MG tablet Commonly known as: PROTONIX Take 1 tablet (40 mg total) by mouth daily. Start taking on: November 29, 2020 Replaces: omeprazole 40 MG capsule   polyethylene glycol 17 g packet Commonly known as: MIRALAX / GLYCOLAX Take 17 g by mouth daily as needed for mild constipation.   rosuvastatin 40 MG tablet Commonly known as: CRESTOR Take 40 mg by mouth every evening.   tamsulosin 0.4 MG Caps capsule Commonly known as: FLOMAX Take 0.4 mg by mouth daily.       Contact information for after-discharge care    [facility] .   Service: Skilled Nursing Contact information: [address] [place] [phone]                 Allergies  Allergen Reactions  . Cefepime Rash    Other reaction(s): Other (See Comments), redness  . Iohexol Hives and Other (See Comments)    A few hives post CT Other reaction(s): hives, few, post CT  . Fentanyl Itching, Nausea And Vomiting and Other (See Comments)    Patient states that it happened over 30 yrs ago  . Iodine Rash    Other reaction(s): Hives/Skin Rash    Discharge Exam: BP 130/62 (BP Location: Right Arm)   Pulse 81   Temp (!) 97.5 F (36.4 C) (Oral)   Resp 18   Ht 5' 11"  (1.803 m)   Wt 109.4 kg   SpO2 100%   BMI  33.64 kg/m  General appearance: alert, cooperative and no distress Head: Normocephalic, without obvious abnormality, atraumatic Eyes: EOMI Lungs: clear to auscultation bilaterally Heart: regular rate and rhythm and S1, S2 normal Abdomen: normal findings: bowel sounds normal and soft, non-tender Extremities: no edema Skin: mobility and turgor normal and no edema Neurologic: Decreased strength in lower extremities bilaterally.  Mild paresthesia per patient in right lower extremity  The results of significant diagnostics from this hospitalization (including imaging, microbiology, ancillary and laboratory) are listed below for reference.   Microbiology: Recent Results (from the past 240 hour(s))  SARS CORONAVIRUS 2 (TAT 6-24 HRS) Nasopharyngeal Nasopharyngeal Swab     Status: None  Collection Time: 11/27/20  2:41 PM   Specimen: Nasopharyngeal Swab  Result Value Ref Range Status   SARS Coronavirus 2 NEGATIVE NEGATIVE Final    Comment: (NOTE) SARS-CoV-2 target nucleic acids are NOT DETECTED.  The SARS-CoV-2 RNA is generally detectable in upper and lower respiratory specimens during the acute phase of infection. Negative results do not preclude SARS-CoV-2 infection, do not rule out co-infections with other pathogens, and should not be used as the sole basis for treatment or other patient management decisions. Negative results must be combined with clinical observations, patient history, and epidemiological information. The expected result is Negative.  Fact Sheet for Patients: SugarRoll.be  Fact Sheet for Healthcare Providers: https://www.woods-mathews.com/  This test is not yet approved or cleared by the [place] FDA and  has been authorized for detection and/or diagnosis of SARS-CoV-2 by FDA under an Emergency Use Authorization (EUA). This EUA will remain  in effect (meaning this test can be used) for the duration of the COVID-19  declaration under Se ction 564(b)(1) of the Act, 21 U.S.C. section 360bbb-3(b)(1), unless the authorization is terminated or revoked sooner.  Performed at [facility], [place] [address], [place], [place] [address]      Labs: BNP (last 3 results) No results for input(s): BNP in the last 8760 hours. Basic Metabolic Panel: Recent Labs  Lab 11/23/20 0548  NA 128*  K 4.6  CL 93*  CO2 25  GLUCOSE 175*  BUN 27*  CREATININE 0.75  CALCIUM 9.3  MG 2.1   Liver Function Tests: No results for input(s): AST, ALT, ALKPHOS, BILITOT, PROT, ALBUMIN in the last 168 hours. No results for input(s): LIPASE, AMYLASE in the last 168 hours. No results for input(s): AMMONIA in the last 168 hours. CBC: No results for input(s): WBC, NEUTROABS, HGB, HCT, MCV, PLT in the last 168 hours. Cardiac Enzymes: No results for input(s): CKTOTAL, CKMB, CKMBINDEX, TROPONINI in the last 168 hours. BNP: Invalid input(s): POCBNP CBG: Recent Labs  Lab 11/27/20 1143 11/27/20 1657 11/27/20 2122 11/28/20 0742 11/28/20 1152  GLUCAP 189* 210* 134* 147* 151*   D-Dimer No results for input(s): DDIMER in the last 72 hours. Hgb A1c No results for input(s): HGBA1C in the last 72 hours. Lipid Profile No results for input(s): CHOL, HDL, LDLCALC, TRIG, CHOLHDL, LDLDIRECT in the last 72 hours. Thyroid function studies No results for input(s): TSH, T4TOTAL, T3FREE, THYROIDAB in the last 72 hours.  Invalid input(s): FREET3 Anemia work up No results for input(s): VITAMINB12, FOLATE, FERRITIN, TIBC, IRON, RETICCTPCT in the last 72 hours. Urinalysis    Component Value Date/Time   COLORURINE STRAW (A) 11/23/2020 1141   APPEARANCEUR CLEAR 11/23/2020 1141   LABSPEC 1.006 11/23/2020 1141   PHURINE 7.0 11/23/2020 1141   GLUCOSEU 50 (A) 11/23/2020 1141   HGBUR NEGATIVE 11/23/2020 1141   BILIRUBINUR NEGATIVE 11/23/2020 1141   KETONESUR NEGATIVE 11/23/2020 1141   PROTEINUR NEGATIVE 11/23/2020 1141   NITRITE  NEGATIVE 11/23/2020 1141   LEUKOCYTESUR NEGATIVE 11/23/2020 1141   Sepsis Labs Invalid input(s): PROCALCITONIN,  WBC,  LACTICIDVEN Microbiology Recent Results (from the past 240 hour(s))  SARS CORONAVIRUS 2 (TAT 6-24 HRS) Nasopharyngeal Nasopharyngeal Swab     Status: None   Collection Time: 11/27/20  2:41 PM   Specimen: Nasopharyngeal Swab  Result Value Ref Range Status   SARS Coronavirus 2 NEGATIVE NEGATIVE Final    Comment: (NOTE) SARS-CoV-2 target nucleic acids are NOT DETECTED.  The SARS-CoV-2 RNA is generally detectable in upper and lower  respiratory specimens during the acute phase of infection. Negative results do not preclude SARS-CoV-2 infection, do not rule out co-infections with other pathogens, and should not be used as the sole basis for treatment or other patient management decisions. Negative results must be combined with clinical observations, patient history, and epidemiological information. The expected result is Negative.  Fact Sheet for Patients: SugarRoll.be  Fact Sheet for Healthcare Providers: https://www.woods-mathews.com/  This test is not yet approved or cleared by the [place] FDA and  has been authorized for detection and/or diagnosis of SARS-CoV-2 by FDA under an Emergency Use Authorization (EUA). This EUA will remain  in effect (meaning this test can be used) for the duration of the COVID-19 declaration under Se ction 564(b)(1) of the Act, 21 U.S.C. section 360bbb-3(b)(1), unless the authorization is terminated or revoked sooner.  Performed at [facility], [place] [address], [place], [place] [address]     Procedures/Studies: CT ABDOMEN PELVIS WO CONTRAST  Result Date: 11/11/2020 CLINICAL DATA:  80 year old male with acute abdominal pain. EXAM: CT ABDOMEN AND PELVIS WITHOUT CONTRAST TECHNIQUE: Multidetector CT imaging of the abdomen and pelvis was performed following the standard protocol  without IV contrast. COMPARISON:  CT abdomen pelvis dated 06/29/2020. FINDINGS: Evaluation of this exam is limited in the absence of intravenous contrast. Evaluation is also limited due to severe streak artifact caused by spinal hardware. Lower chest: The visualized lung bases are clear. There is 3 vessel coronary vascular calcification. No intra-abdominal free air or free fluid Hepatobiliary: The liver is unremarkable as visualized. Small stones may be present within the gallbladder. No pericholecystic fluid or evidence of acute cholecystitis by CT. Pancreas: Unremarkable. No pancreatic ductal dilatation or surrounding inflammatory changes. Spleen: Normal in size without focal abnormality. Adrenals/Urinary Tract: The adrenal glands unremarkable. Indeterminate 2 cm exophytic hypodense lesion from the medial upper pole of the right kidney. There is a 5 mm nonobstructing stone or parenchymal calcification in the interpolar left kidney. There is no hydronephrosis or obstructing stone on either side. The visualized ureters and urinary bladder appear unremarkable. Stomach/Bowel: AP several small scattered sigmoid diverticula without active inflammatory changes. There is loose stool throughout the colon compatible with diarrheal state. Correlation with clinical exam and stool cultures recommended. There is no bowel obstruction or active inflammation. No evidence of acute appendicitis. Vascular/Lymphatic: Advanced aortoiliac atherosclerotic disease. The IVC is unremarkable. No portal venous gas. There is no adenopathy. Reproductive: The prostate and seminal vesicles are grossly unremarkable. No pelvic mass. Other: None Musculoskeletal: Osteopenia with degenerative changes of the spine. L1 vertebroplasty. L2-S1 posterior fusion hardware. No acute osseous pathology. IMPRESSION: 1. Diarrheal state. Correlation with clinical exam and stool cultures recommended. No bowel obstruction. 2. Sigmoid diverticulosis. 3. A 5 mm  nonobstructing stone or a focus of parenchymal calcification in the interpolar left kidney. No hydronephrosis or obstructing stone. 4. Aortic Atherosclerosis (ICD10-I70.0). Electronically Signed   By: Anner Crete M.D.   On: 11/11/2020 15:22   DG Chest Port 1 View  Result Date: 11/11/2020 CLINICAL DATA:  Fever EXAM: PORTABLE CHEST 1 VIEW COMPARISON:  October 29, 2020 chest radiograph and chest CT FINDINGS: There is ill-defined opacity in the left base. Lungs otherwise are clear. Heart size and pulmonary vascularity are normal. No adenopathy. There is arthropathy in the shoulders. IMPRESSION: Ill-defined opacity left base consistent with developing pneumonia. Advise check of COVID-19 status given this appearance. Lungs elsewhere clear. Heart size normal. Electronically Signed   By: Lowella Grip III M.D.  On: 11/11/2020 14:39   IR INJECT DIAG/THERA/INC NEEDLE/CATH/PLC EPI/LUMB/SAC W/IMG  Result Date: 11/26/2020 CLINICAL DATA:  Severe low back and left lower extremity pain. 8 previous lumbar surgeries including cement augmentation L1, instrumented posterior fusion L1-sacrum. EXAM: CAUDAL EPIDURAL INJECTION UNDER FLUOROSCOPIC GUIDANCE FLUOROSCOPY TIME:  24 seconds; 25 mGy PROCEDURE: After a thorough discussion of alternatives, risks and benefits of the procedure including bleeding, infection, injury to nerves, blood vessels, and adjacent structures, written and oral informed consent was obtained. Consent was obtained. Specific risks to this procedure included injury to the terminal portion of the thecal sac and/or nerve roots. Utilizing a caudal approach, the skin overlying the sacral hiatus was cleansed and anesthetized. A 20 gauge Crawford epidural needle was advanced into the sacral epidural space. Injection of Omnipaque 180 shows a good epidural pattern with spread up to L5-S1, predominantly left-sided. No vascular or subarachnoid opacification is seen. 80 mg of Depo-Medrol mixed with 5cc of 1%  Lidocaine were instilled. The procedure was well-tolerated, and the patient was discharged thirty minutes following the injection in good condition. IMPRESSION: Technically successful  caudal epidural injection. Electronically Signed   By: Lucrezia Europe M.D.   On: 11/26/2020 14:27     Time coordinating discharge: Over 30 minutes    Dwyane Dee, MD  [facility] 11/28/2020, 2:02 PM

## 2020-11-28 NOTE — Progress Notes (Signed)
Report called to SNF, ppwk gathered, pt given IM Morphine as per order, awaiting EMS transport. Pt states that he has all of his personal belongings.

## 2020-11-28 NOTE — TOC Transition Note (Addendum)
Transition of Care [facility]) - CM/SW Discharge Note   Patient Details  Name: Jorge Sherman MRN: [id number] Date of Birth: 09-Jun-1941  Transition of Care [facility]) CM/SW Contact:  Ross Ludwig, LCSW Phone Number: 11/28/2020, 2:27 PM   Clinical Narrative:     Patient to be d/c'ed today to [facility].  Patient and family agreeable to plans will transport via ems RN to call report to [phone] room 205A.  Patient's daughter was made aware that patient is discharging today, patient's daughter [name], would like a call once patient has been picked up by EMS.     Final next level of care: Skilled Nursing Facility Barriers to Discharge: Barriers Resolved   Patient Goals and CMS Choice Patient states their goals for this hospitalization and ongoing recovery are:: To return home after short term rehab. CMS Medicare.gov Compare Post Acute Care list provided to:: Patient Represenative (must comment) Choice offered to / list presented to : [place]  Discharge Placement   Existing PASRR number confirmed : 11/16/20          Patient [facility] bed at: [facility] Patient to be transferred to facility by: [facility] Name of family member notified: Evette Cristal, MSW, LCSW Patient and family notified of of transfer: 11/28/20  Discharge Plan and Services In-house Referral: NA Discharge Planning Services: NA Post Acute Care Choice: [facility]          DME Arranged: N/A DME Agency: NA       HH Arranged: NA HH Agency: NA        Social Determinants of Health (SDOH) Interventions     Readmission Risk Interventions Readmission Risk Prevention Plan 11/17/2020 06/29/2020  Transportation Screening Complete Complete  Home Care Screening - Complete  Medication Review (RN CM) - Complete  Medication Review Press photographer) Complete -  PCP or Specialist appointment within 3-5 days of discharge Complete -  SW Recovery Care/Counseling  Consult Complete -  [place] Complete -  Some recent data might be hidden

## 2020-11-28 NOTE — TOC Progression Note (Addendum)
Transition of Care [facility]) - Progression Note    Patient Details  Name: Jorge Sherman MRN: [id number] Date of Birth: 21-Feb-1941  Transition of [facility]) CM/SW Contact  Ross Ludwig, [place] Phone Number: 11/28/2020, 9:19 AM  Clinical Narrative:    Patient does not have insurance authorization yet, so he will not be able to discharge until Josem Kaufmann is received. CSW called insurance authorization has not been approved yet.  Authorization has been started, insurance company will let TOC know when Josem Kaufmann is received.  11:55am  CSW received phone call from [employer] company that he has been approved for SNF placement at [facility] in [place].  Reference number is [id number], authorization number is [id number].  CSW called [facility] in [place] and spoke to [place], she can accept patient today.  CSW notified bedside nurse and attending physician.   Expected Discharge Plan: Skilled Nursing Facility Barriers to Discharge: Insurance Authorization,Other (comment) (covid test)  Expected Discharge Plan and Services Expected Discharge Plan: [place] In-house Referral: NA Discharge Planning Services: NA Post Acute Care Choice: [place] Living arrangements for the past 2 months: Apartment                 DME Arranged: N/A DME Agency: NA       HH Arranged: NA HH Agency: NA         Social Determinants of Health (SDOH) Interventions    Readmission Risk Interventions Readmission Risk Prevention Plan 11/17/2020 06/29/2020  Transportation Screening Complete Complete  Home Care Screening - Complete  Medication Review (RN CM) - Complete  Medication Review Press photographer) Complete -  PCP or Specialist appointment within 3-5 days of discharge Complete -  SW Recovery Care/Counseling Consult Complete -  [place] Complete -  Some recent data might be hidden

## 2020-11-28 NOTE — Plan of Care (Signed)
Report called to at [facility] [phone] [place].

## 2020-11-29 ENCOUNTER — Encounter: Payer: Self-pay | Admitting: Emergency Medicine

## 2020-11-29 ENCOUNTER — Emergency Department
Admission: EM | Admit: 2020-11-29 | Discharge: 2020-11-30 | Disposition: A | Discharge status: Home / Self Care | Payer: Medicare HMO | Attending: Emergency Medicine | Admitting: Emergency Medicine

## 2020-11-29 ENCOUNTER — Other Ambulatory Visit: Payer: Self-pay

## 2020-11-29 DIAGNOSIS — E119 Type 2 diabetes mellitus without complications: Secondary | ICD-10-CM | POA: Diagnosis not present

## 2020-11-29 DIAGNOSIS — Z79899 Other long term (current) drug therapy: Secondary | ICD-10-CM | POA: Diagnosis not present

## 2020-11-29 DIAGNOSIS — M545 Low back pain, unspecified: Secondary | ICD-10-CM | POA: Diagnosis not present

## 2020-11-29 DIAGNOSIS — I10 Essential (primary) hypertension: Secondary | ICD-10-CM | POA: Diagnosis not present

## 2020-11-29 DIAGNOSIS — G8929 Other chronic pain: Secondary | ICD-10-CM | POA: Insufficient documentation

## 2020-11-29 DIAGNOSIS — I2510 Atherosclerotic heart disease of native coronary artery without angina pectoris: Secondary | ICD-10-CM | POA: Insufficient documentation

## 2020-11-29 DIAGNOSIS — M549 Dorsalgia, unspecified: Secondary | ICD-10-CM | POA: Diagnosis present

## 2020-11-29 DIAGNOSIS — Z87891 Personal history of nicotine dependence: Secondary | ICD-10-CM | POA: Insufficient documentation

## 2020-11-29 MED ORDER — OXYCODONE HCL 5 MG PO TABS
5.0000 mg | ORAL_TABLET | ORAL | Status: AC
Start: 2020-11-29 — End: 2020-11-29
  Administered 2020-11-29: 5 mg via ORAL
  Filled 2020-11-29: qty 1

## 2020-11-29 MED ORDER — METHOCARBAMOL 500 MG PO TABS
500.0000 mg | ORAL_TABLET | ORAL | Status: AC
  Administered 2020-11-29: 500 mg via ORAL
  Filled 2020-11-29: qty 1

## 2020-11-29 MED ORDER — LORAZEPAM 1 MG PO TABS
1.0000 mg | ORAL_TABLET | ORAL | Status: AC
  Administered 2020-11-29: 1 mg via ORAL
  Filled 2020-11-29: qty 1

## 2020-11-29 MED ORDER — ALUM & MAG HYDROXIDE-SIMETH 200-200-20 MG/5ML PO SUSP
30.0000 mL | Freq: Once | ORAL | Status: AC
  Administered 2020-11-29: 30 mL via ORAL
  Filled 2020-11-29: qty 30

## 2020-11-29 MED ORDER — ACETAMINOPHEN 500 MG PO TABS
1000.0000 mg | ORAL_TABLET | ORAL | Status: AC
  Administered 2020-11-29: 1000 mg via ORAL
  Filled 2020-11-29: qty 2

## 2020-11-29 MED ORDER — FAMOTIDINE 20 MG PO TABS
40.0000 mg | ORAL_TABLET | Freq: Once | ORAL | Status: AC
  Administered 2020-11-29: 40 mg via ORAL
  Filled 2020-11-29: qty 2

## 2020-11-29 MED ORDER — GABAPENTIN 300 MG PO CAPS
500.0000 mg | ORAL_CAPSULE | ORAL | Status: AC
  Administered 2020-11-29: 500 mg via ORAL
  Filled 2020-11-29: qty 1

## 2020-11-29 NOTE — ED Notes (Signed)
RN has been in contact with [name], pt's daughter and POA (per [name]) to discuss discharge. [name] understands that her father meets criteria for discharge at this time and the plan is for him to return to the [facility] in [place]; however, transportation is a challenge at this time as there are no EMS vehicles available to transport. [name] states she is unable to come pick pt up as she lives almost 2 hours away and is caring for her mother who is sick with COVID.

## 2020-11-29 NOTE — ED Notes (Signed)
First Nurse Note: Pt to ED via [facility] from [facility] for lower back pain. Pt DC from [facility] yesterday for surgery on his back. Per EMS VSS. Pt in NAD.

## 2020-11-29 NOTE — ED Triage Notes (Signed)
Pt to ED via [facility] with c/o back pain, pt D/C from [facility] yesterday after having procedure done and D/C to [facility]. Per EMS pt is happy with pain control at [facility], pt reports he does not wish to return to the [facility]. Pt with all of his personal belongings with him from [facility].  Pt's belongings labeled and placed behind patient advocate desk while patient in triage. Pt in blue recliner. Pt cleansed of stool incontinence at this time.

## 2020-11-29 NOTE — ED Provider Notes (Signed)
[facility] Emergency Department Provider Note  ____________________________________________  Time seen: Approximately 7:38 PM  I have reviewed the triage vital signs and the nursing notes.   HISTORY  Chief Complaint Back Pain and Pain Control    HPI Jorge Sherman is a 80 y.o. male with a history of chronic pain, obesity, with epidural steroid injection on January 6 who was discharged to [facility] for rehab /SNF.  He denies any new symptoms other than his chronic back pain.  No chest pain shortness of breath fevers chills dysuria frequency urgency, loss of bowel or bladder control.  No leg weakness or paresthesia.  He requested transfer to the ED for evaluation today due to his ongoing pain.  He unfortunately has had a 6-hour wait time to be seen, during which time he notes that he has been able to reflect on his chronic pain and does not feel that he needs to be hospitalized.  He feels ready to go back to the [facility].     Past Medical History:  Diagnosis Date  . Chronic pain   . Depressive disorder, not elsewhere classified   . Dizziness and giddiness   . Essential and other specified forms of tremor   . Headache(784.0)   . Leukocytosis, unspecified   . Obstructive sleep apnea (adult) (pediatric)   . Pain in joint, lower leg   . Septic joint of left knee joint ([place]) 04/22/2019  . Spinal stenosis, lumbar region, without neurogenic claudication   . Thoracic or lumbosacral neuritis or radiculitis, unspecified   . Type II or unspecified type diabetes mellitus without mention of complication, not stated as uncontrolled   . Unspecified cardiovascular disease   . Unspecified essential hypertension      Patient Active Problem List   Diagnosis Date Noted  . Hyponatremia 11/11/2020  . Falls 10/30/2020  . Dehydration with hyponatremia 10/29/2020  . Fall at home, initial encounter 10/29/2020  . Musculoskeletal chest pain 10/29/2020  . Mixed  hyperlipidemia due to type 2 diabetes mellitus ([place]) 10/29/2020  . Benign prostatic hyperplasia without lower urinary tract symptoms 10/29/2020  . Ileus ([place])   . Declining functional status   . Goals of care, counseling/discussion   . Palliative care by specialist   . AKI (acute kidney injury) ([place]) 06/27/2020  . Acute diarrhea 06/27/2020  . Altered mental status 08/28/2019  . Septic joint of left knee joint ([place]) 04/22/2019  . Erectile dysfunction 10/30/2013  . Essential hypertension, benign 10/30/2013  . Coronary artery disease involving native coronary artery of native heart without angina pectoris 10/30/2013  . Dizziness and giddiness   . Pain in joint, lower leg   . Type 2 diabetes mellitus without complication, with long-term current use of insulin ([place])   . Obstructive sleep apnea   . Depressive disorder, not elsewhere classified   . Essential and other specified forms of tremor   . Headache(784.0)   . Spinal stenosis, lumbar region, without neurogenic claudication   . Thoracic or lumbosacral neuritis or radiculitis, unspecified   . Chronic pain syndrome      Past Surgical History:  Procedure Laterality Date  . BACK SURGERY    . back surgey     times 3  . IR INJECT/THERA/INC NEEDLE/CATH/PLC EPI/LUMB/SAC W/IMG  11/26/2020     Prior to Admission medications   Medication Sig Start Date End Date Taking? Authorizing Provider  acetaminophen (TYLENOL) 500 MG tablet Take 2 tablets (1,000 mg total) by mouth every 6 (six)  hours as needed for headache, mild pain or fever. 11/28/20   Dwyane Dee, MD  albuterol (VENTOLIN HFA) 108 (90 Base) MCG/ACT inhaler Inhale 2 puffs into the lungs every 4 (four) hours as needed for wheezing or shortness of breath. 11/03/20   Pokhrel, Corrie Mckusick, MD  amLODipine (NORVASC) 10 MG tablet Take 1 tablet (10 mg total) by mouth daily. 11/29/20   Dwyane Dee, MD  cloNIDine (CATAPRES) 0.1 MG tablet Take 0.1 mg by mouth 2 (two) times daily.     [provider]  diclofenac Sodium (VOLTAREN) 1 % GEL Apply 2 g topically 4 (four) times daily as needed for pain. 01/02/20   [provider]  DULoxetine (CYMBALTA) 30 MG capsule Take 1 capsule (30 mg total) by mouth 3 (three) times daily. 11/28/20   Dwyane Dee, MD  ferrous sulfate 325 (65 FE) MG tablet Take 325 mg by mouth daily.     [provider]  gabapentin (NEURONTIN) 300 MG capsule Take 1 capsule (300 mg total) by mouth 3 (three) times daily. 11/28/20   Dwyane Dee, MD  hydroxychloroquine (PLAQUENIL) 200 MG tablet Take 200 mg by mouth 2 (two) times daily.    [provider]  lidocaine (LIDODERM) 5 % Place 1 patch onto the skin daily. Remove & Discard patch within 12 hours or as directed by MD. Right chest 11/03/20   Pokhrel, Corrie Mckusick, MD  linaclotide (LINZESS) 72 MCG capsule Take 72 mcg by mouth daily as needed (constipation).    [provider]  loperamide (IMODIUM A-D) 2 MG tablet Take 1 tablet (2 mg total) by mouth 4 (four) times daily as needed for diarrhea or loose stools. 07/04/20   Georgette Shell, MD  LORazepam (ATIVAN) 1 MG tablet Take 1 tablet (1 mg total) by mouth every 8 (eight) hours as needed for anxiety. 11/28/20   Dwyane Dee, MD  methocarbamol (ROBAXIN) 500 MG tablet Take 1 tablet (500 mg total) by mouth every 6 (six) hours as needed for muscle spasms. 11/28/20   Dwyane Dee, MD  Multiple Vitamin (MULTIVITAMIN WITH MINERALS) TABS tablet Take 1 tablet by mouth daily. 11/29/20   Dwyane Dee, MD  ondansetron (ZOFRAN) 4 MG tablet Take 1 tablet (4 mg total) by mouth every 6 (six) hours as needed for nausea. 07/04/20   Georgette Shell, MD  oxyCODONE (OXY IR/ROXICODONE) 5 MG immediate release tablet Take 1 tablet (5 mg total) by mouth every 6 (six) hours as needed for severe pain. 11/28/20   Dwyane Dee, MD  pantoprazole (PROTONIX) 40 MG tablet Take 1 tablet (40 mg total) by mouth daily. 11/29/20   Dwyane Dee, MD  polyethylene glycol  (MIRALAX / GLYCOLAX) 17 g packet Take 17 g by mouth daily as needed for mild constipation. 11/03/20   Pokhrel, Corrie Mckusick, MD  rosuvastatin (CRESTOR) 40 MG tablet Take 40 mg by mouth every evening.     [provider]  tamsulosin (FLOMAX) 0.4 MG CAPS capsule Take 0.4 mg by mouth daily.    [provider]     Allergies Cefepime, Iohexol, Fentanyl, and Iodine   Family History  Problem Relation Age of Onset  . Cancer Father   . Heart disease Father   . Stroke Mother   . Diabetes Mother     Social History Social History   Tobacco Use  . Smoking status: Former Research scientist (life sciences)  . Smokeless tobacco: Never Used  Substance Use Topics  . Alcohol use: No  . Drug use: No    Review  of Systems  Constitutional:   No fever or chills.  ENT:   No sore throat. No rhinorrhea. Cardiovascular:   No chest pain or syncope. Respiratory:   No dyspnea or cough. Gastrointestinal:   Negative for abdominal pain, vomiting and diarrhea.  Musculoskeletal: Chronic back pain as above All other systems reviewed and are negative except as documented above in ROS and HPI.  ____________________________________________   PHYSICAL EXAM:  VITAL SIGNS: ED Triage Vitals  Enc Vitals Group     BP 11/29/20 1307 (!) 111/49     Pulse Rate 11/29/20 1307 91     Resp 11/29/20 1307 (!) 22     Temp 11/29/20 1307 98.5 F (36.9 C)     Temp Source 11/29/20 1307 Oral     SpO2 11/29/20 1307 99 %     Weight 11/29/20 1242 241 lb 2.9 oz (109.4 kg)     Height 11/29/20 1242 5\' 11"  (1.803 m)     Head Circumference --      Peak Flow --      Pain Score 11/29/20 1242 10     Pain Loc --      Pain Edu? --      Excl. in [place]? --     Vital signs reviewed, nursing assessments reviewed.   Constitutional:   Alert and oriented. Non-toxic appearance. Eyes:   Conjunctivae are normal. EOMI. PERRL. ENT      Head:   Normocephalic and atraumatic.      Nose:   Normal.      Mouth/Throat: Moist mucous membranes      Neck:    No meningismus. Full ROM. Hematological/Lymphatic/Immunilogical:   No cervical lymphadenopathy. Cardiovascular:   RRR. Symmetric bilateral radial and DP pulses.  No murmurs. Cap refill less than 2 seconds. Respiratory:   Normal respiratory effort without tachypnea/retractions. Breath sounds are clear and equal bilaterally. No wheezes/rales/rhonchi. Gastrointestinal:   Soft and nontender. Non distended. There is no CVA tenderness.  No rebound, rigidity, or guarding.  Musculoskeletal:   Normal range of motion in all extremities. No joint effusions.  No lower extremity tenderness.  No edema.  No midline spinal tenderness Neurologic:   Normal speech and language.  Motor grossly intact.  EHL function intact No acute focal neurologic deficits are appreciated.  Skin:    Skin is warm, dry and intact. No rash noted.  No petechiae, purpura, or bullae.  ____________________________________________    LABS (pertinent positives/negatives) (all labs ordered are listed, but only abnormal results are displayed) Labs Reviewed - No data to display ____________________________________________   EKG  Interpreted by me Normal sinus rhythm rate of 88, left axis, first-degree AV block.  Normal QRS ST segments and T waves.  ____________________________________________    RADIOLOGY  No results found.  ____________________________________________   PROCEDURES Procedures  ____________________________________________    CLINICAL IMPRESSION / ASSESSMENT AND PLAN / ED COURSE  Medications ordered in the ED: Medications  methocarbamol (ROBAXIN) tablet 500 mg (has no administration in time range)  acetaminophen (TYLENOL) tablet 1,000 mg (1,000 mg Oral Given 11/29/20 1908)  gabapentin (NEURONTIN) capsule 500 mg (500 mg Oral Given 11/29/20 1908)  LORazepam (ATIVAN) tablet 1 mg (1 mg Oral Given 11/29/20 1908)  oxyCODONE (Oxy IR/ROXICODONE) immediate release tablet 5 mg (5 mg Oral Given 11/29/20 1908)  alum  & mag hydroxide-simeth (MAALOX/MYLANTA) 200-200-20 MG/5ML suspension 30 mL (30 mLs Oral Given 11/29/20 1908)  famotidine (PEPCID) tablet 40 mg (40 mg Oral Given 11/29/20 1908)    Pertinent labs &  imaging results that were available during my care of the patient were reviewed by me and considered in my medical decision making (see chart for details).  Jorge Sherman was evaluated in Emergency Department on 11/29/2020 for the symptoms described in the history of present illness. He was evaluated in the context of the global COVID-19 pandemic, which necessitated consideration that the patient might be at risk for infection with the SARS-CoV-2 virus that causes COVID-19. Institutional protocols and algorithms that pertain to the evaluation of patients at risk for COVID-19 are in a state of rapid change based on information released by regulatory bodies including the CDC and federal and state organizations. These policies and algorithms were followed during the patient's care in the ED.   Patient presents to the ED for pain control due to ongoing chronic back pain despite recent hospitalization for pain control and steroid injection.  He reports the he would like his home dose of medications and would be eager to go back to the [facility] for continued treatment.  Recommend he continue to follow-up with his PCP and orthopedics.  At this time I do not see any acute medical condition that requires emergency care.  He has no acute complaints, exam is reassuring, vitals are reassuring.        ____________________________________________   FINAL CLINICAL IMPRESSION(S) / ED DIAGNOSES    Final diagnoses:  Bilateral low back pain without sciatica, unspecified chronicity     ED Discharge Orders    None      Portions of this note were generated with dragon dictation software. Dictation errors may occur despite best attempts at proofreading.   Carrie Mew, MD 11/29/20 1944

## 2020-11-29 NOTE — ED Provider Notes (Signed)
MSE was initiated and I personally evaluated the patient and placed orders (if any) at  5:27 PM on November 29, 2020.  The patient appears stable so that the remainder of the MSE may be completed by another provider.  Patient reports he is here for pain control, complains of pain in his left leg, does report that he has steroid injection recently.  Patient will need further evaluation   Lavonia Drafts, MD 11/29/20 1728

## 2020-11-30 MED ORDER — ACETAMINOPHEN 500 MG PO TABS
1000.0000 mg | ORAL_TABLET | Freq: Once | ORAL | Status: AC
  Administered 2020-11-30: 1000 mg via ORAL
  Filled 2020-11-30: qty 2

## 2020-11-30 MED ORDER — OXYCODONE HCL 5 MG PO TABS
5.0000 mg | ORAL_TABLET | ORAL | Status: AC
Start: 2020-11-30 — End: 2020-11-30
  Administered 2020-11-30: 5 mg via ORAL
  Filled 2020-11-30: qty 1

## 2020-11-30 MED ORDER — HYDROXYZINE HCL 25 MG PO TABS
50.0000 mg | ORAL_TABLET | Freq: Once | ORAL | Status: AC
  Administered 2020-11-30: 50 mg via ORAL
  Filled 2020-11-30: qty 2

## 2020-11-30 NOTE — ED Notes (Signed)
[facility] to give report - received no answer at facility  [phone]

## 2020-11-30 NOTE — ED Notes (Signed)
Called back to [facility] - report given to [place]   Per note POA - his daughter is aware of the plan for him to return to [facility]

## 2020-11-30 NOTE — ED Notes (Signed)
Pt observed lying on stretcher in 19 hallway  Eyes closed  Even, unlabored respirations observed    He is pending transfer back to his facility  VS to be obtained when he awakens / transportation arrives

## 2020-11-30 NOTE — ED Notes (Signed)
One large black suitcase retrieved from behind pt relations desk and given to the transporters

## 2020-11-30 NOTE — ED Notes (Signed)
Called [facility] for transport to [facility] on list [phone]

## 2020-11-30 NOTE — ED Notes (Signed)
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

## 2020-11-30 NOTE — ED Notes (Signed)
Pt restless and uncomfortable. Asking for medication. Agreed tylenol would be helpful

## 2021-01-07 ENCOUNTER — Emergency Department (HOSPITAL_COMMUNITY): Payer: Medicare HMO

## 2021-01-07 ENCOUNTER — Emergency Department (HOSPITAL_COMMUNITY)
Admission: EM | Admit: 2021-01-07 | Discharge: 2021-01-09 | Disposition: A | Discharge status: Home Health | Payer: Medicare HMO | Attending: Emergency Medicine | Admitting: Emergency Medicine

## 2021-01-07 ENCOUNTER — Encounter (HOSPITAL_COMMUNITY): Payer: Self-pay | Admitting: Emergency Medicine

## 2021-01-07 ENCOUNTER — Other Ambulatory Visit: Payer: Self-pay

## 2021-01-07 DIAGNOSIS — Z79899 Other long term (current) drug therapy: Secondary | ICD-10-CM | POA: Insufficient documentation

## 2021-01-07 DIAGNOSIS — Z20822 Contact with and (suspected) exposure to covid-19: Secondary | ICD-10-CM | POA: Insufficient documentation

## 2021-01-07 DIAGNOSIS — R531 Weakness: Secondary | ICD-10-CM | POA: Insufficient documentation

## 2021-01-07 DIAGNOSIS — R059 Cough, unspecified: Secondary | ICD-10-CM | POA: Diagnosis not present

## 2021-01-07 DIAGNOSIS — G8929 Other chronic pain: Secondary | ICD-10-CM | POA: Diagnosis not present

## 2021-01-07 DIAGNOSIS — I10 Essential (primary) hypertension: Secondary | ICD-10-CM | POA: Diagnosis not present

## 2021-01-07 DIAGNOSIS — Z87891 Personal history of nicotine dependence: Secondary | ICD-10-CM | POA: Diagnosis not present

## 2021-01-07 DIAGNOSIS — M545 Low back pain, unspecified: Secondary | ICD-10-CM | POA: Diagnosis not present

## 2021-01-07 DIAGNOSIS — R202 Paresthesia of skin: Secondary | ICD-10-CM | POA: Diagnosis not present

## 2021-01-07 DIAGNOSIS — E119 Type 2 diabetes mellitus without complications: Secondary | ICD-10-CM | POA: Diagnosis not present

## 2021-01-07 DIAGNOSIS — R300 Dysuria: Secondary | ICD-10-CM | POA: Diagnosis not present

## 2021-01-07 LAB — COMPREHENSIVE METABOLIC PANEL
ALT: 16 U/L (ref 0–44)
AST: 15 U/L (ref 15–41)
Albumin: 3.7 g/dL (ref 3.5–5.0)
Alkaline Phosphatase: 64 U/L (ref 38–126)
Anion gap: 10 (ref 5–15)
BUN: 9 mg/dL (ref 8–23)
CO2: 26 mmol/L (ref 22–32)
Calcium: 9.4 mg/dL (ref 8.9–10.3)
Chloride: 103 mmol/L (ref 98–111)
Creatinine, Ser: 0.61 mg/dL (ref 0.61–1.24)
GFR, Estimated: >60 mL/min (ref >60)
Glucose, Bld: 114 mg/dL — ABNORMAL HIGH (ref 70–99)
Potassium: 3.3 mmol/L — ABNORMAL LOW (ref 3.5–5.1)
Sodium: 139 mmol/L (ref 135–145)
Total Bilirubin: 0.6 mg/dL (ref 0.3–1.2)
Total Protein: 6.3 g/dL — ABNORMAL LOW (ref 6.5–8.1)

## 2021-01-07 LAB — URINALYSIS, ROUTINE W REFLEX MICROSCOPIC
Bilirubin Urine: NEGATIVE
Glucose, UA: NEGATIVE mg/dL
Hgb urine dipstick: NEGATIVE
Ketones, ur: 5 mg/dL — AB
Leukocytes,Ua: NEGATIVE
Nitrite: NEGATIVE
Protein, ur: 30 mg/dL — AB
Specific Gravity, Urine: 1.016 (ref 1.005–1.030)
pH: 5 (ref 5.0–8.0)

## 2021-01-07 LAB — CBC WITH DIFFERENTIAL/PLATELET
Abs Immature Granulocytes: 0.18 10*3/uL — ABNORMAL HIGH (ref 0.00–0.07)
Basophils Absolute: 0.1 10*3/uL (ref 0.0–0.1)
Basophils Relative: 1 %
Eosinophils Absolute: 0 10*3/uL (ref 0.0–0.5)
Eosinophils Relative: 0 %
HCT: 35.9 % — ABNORMAL LOW (ref 39.0–52.0)
Hemoglobin: 11.9 g/dL — ABNORMAL LOW (ref 13.0–17.0)
Immature Granulocytes: 2 %
Lymphocytes Relative: 13 %
Lymphs Abs: 1.6 10*3/uL (ref 0.7–4.0)
MCH: 31.3 pg (ref 26.0–34.0)
MCHC: 33.1 g/dL (ref 30.0–36.0)
MCV: 94.5 fL (ref 80.0–100.0)
Monocytes Absolute: 1.1 10*3/uL — ABNORMAL HIGH (ref 0.1–1.0)
Monocytes Relative: 9 %
Neutro Abs: 8.8 10*3/uL — ABNORMAL HIGH (ref 1.7–7.7)
Neutrophils Relative %: 75 %
Platelets: 422 10*3/uL — ABNORMAL HIGH (ref 150–400)
RBC: 3.8 MIL/uL — ABNORMAL LOW (ref 4.22–5.81)
RDW: 14.5 % (ref 11.5–15.5)
WBC: 11.8 10*3/uL — ABNORMAL HIGH (ref 4.0–10.5)
nRBC: 0 % (ref 0.0–0.2)

## 2021-01-07 LAB — RESP PANEL BY RT-PCR (FLU A&B, COVID) ARPGX2
Influenza A by PCR: NEGATIVE
Influenza B by PCR: NEGATIVE
SARS Coronavirus 2 by RT PCR: NEGATIVE

## 2021-01-07 LAB — TROPONIN I (HIGH SENSITIVITY)
Troponin I (High Sensitivity): 9 ng/L (ref <18)
Troponin I (High Sensitivity): 10 ng/L (ref <18)

## 2021-01-07 LAB — CK: Total CK: 27 U/L — ABNORMAL LOW (ref 49–397)

## 2021-01-07 MED ORDER — LORAZEPAM 2 MG/ML IJ SOLN
0.5000 mg | Freq: Once | INTRAMUSCULAR | Status: AC
  Administered 2021-01-07: 0.5 mg via INTRAVENOUS
  Filled 2021-01-07: qty 1

## 2021-01-07 MED ORDER — HYDROMORPHONE HCL 1 MG/ML IJ SOLN
0.5000 mg | Freq: Once | INTRAMUSCULAR | Status: AC
Start: 2021-01-07 — End: 2021-01-07
  Administered 2021-01-07: 0.5 mg via INTRAVENOUS
  Filled 2021-01-07: qty 1

## 2021-01-07 MED ORDER — IOHEXOL 350 MG/ML SOLN
100.0000 mL | Freq: Once | INTRAVENOUS | Status: AC | PRN
  Administered 2021-01-07: 100 mL via INTRAVENOUS

## 2021-01-07 MED ORDER — HYDROCORTISONE NA SUCCINATE PF 250 MG IJ SOLR
200.0000 mg | Freq: Once | INTRAMUSCULAR | Status: AC
  Administered 2021-01-07: 200 mg via INTRAVENOUS
  Filled 2021-01-07: qty 200

## 2021-01-07 MED ORDER — HYDROMORPHONE HCL 1 MG/ML IJ SOLN
0.5000 mg | Freq: Once | INTRAMUSCULAR | Status: AC
  Administered 2021-01-07: 0.5 mg via INTRAVENOUS
  Filled 2021-01-07: qty 1

## 2021-01-07 MED ORDER — DIPHENHYDRAMINE HCL 25 MG PO CAPS: 50.0000 mg | ORAL_CAPSULE | Freq: Once | ORAL | Status: AC

## 2021-01-07 MED ORDER — DIPHENHYDRAMINE HCL 50 MG/ML IJ SOLN
50.0000 mg | Freq: Once | INTRAMUSCULAR | Status: AC
  Administered 2021-01-07: 50 mg via INTRAVENOUS
  Filled 2021-01-07: qty 1

## 2021-01-07 NOTE — ED Provider Notes (Signed)
[facility] DEPT Provider Note   CSN: [id number] Arrival date & time: 01/07/21  1149     History Chief Complaint  Patient presents with  . Dysuria  . Generalized Body Aches  . Weakness  . Leg Pain  . Shoulder Pain    Jorge Sherman is a 80 y.o. male with a past medical history of type 2 diabetes, chronic pain in lower back, presenting to the ED with a chief complaint of myalgias and dysuria.  Reports generalized weakness for the past 2 weeks.  Patient somewhat difficult historian as he is constantly mentioning his pain instead of the other symptoms that he is having.  Chart review shows that he was admitted and discharged at beginning of January for sepsis in the setting of UTI and ?GI etiology?  He was given a spinal injection for pain but states that this made him feel worse.  He is complaining of pain in his bilateral arms and legs as well as paresthesias.  This has been new for him for the past week.  He states that these myalgias are different than his chronic pain.  Minimal improvement noted with his home oxycodone.  He is also concerned that he may have a UTI as he has been having dysuria and is prone to having UTIs in the past.  Denies any abdominal pain.  Reports a cough but denies any shortness of breath, chest pain, fever.  He uses a motorized scooter to get around at home.  He has home health but has refused SNF placement several times in the past.  HPI     Past Medical History:  Diagnosis Date  . Chronic pain   . Depressive disorder, not elsewhere classified   . Dizziness and giddiness   . Essential and other specified forms of tremor   . Headache(784.0)   . Leukocytosis, unspecified   . Obstructive sleep apnea (adult) (pediatric)   . Pain in joint, lower leg   . Septic joint of left knee joint ([place]) 04/22/2019  . Spinal stenosis, lumbar region, without neurogenic claudication   . Thoracic or lumbosacral neuritis or radiculitis,  unspecified   . Type II or unspecified type diabetes mellitus without mention of complication, not stated as uncontrolled   . Unspecified cardiovascular disease   . Unspecified essential hypertension     Patient Active Problem List   Diagnosis Date Noted  . Hyponatremia 11/11/2020  . Falls 10/30/2020  . Dehydration with hyponatremia 10/29/2020  . Fall at home, initial encounter 10/29/2020  . Musculoskeletal chest pain 10/29/2020  . Mixed hyperlipidemia due to type 2 diabetes mellitus ([place]) 10/29/2020  . Benign prostatic hyperplasia without lower urinary tract symptoms 10/29/2020  . Ileus ([place])   . Declining functional status   . Goals of care, counseling/discussion   . Palliative care by specialist   . AKI (acute kidney injury) ([place]) 06/27/2020  . Acute diarrhea 06/27/2020  . Altered mental status 08/28/2019  . Septic joint of left knee joint ([place]) 04/22/2019  . Erectile dysfunction 10/30/2013  . Essential hypertension, benign 10/30/2013  . Coronary artery disease involving native coronary artery of native heart without angina pectoris 10/30/2013  . Dizziness and giddiness   . Pain in joint, lower leg   . Type 2 diabetes mellitus without complication, with long-term current use of insulin ([place])   . Obstructive sleep apnea   . Depressive disorder, not elsewhere classified   . Essential and other specified forms of tremor   .  Headache(784.0)   . Spinal stenosis, lumbar region, without neurogenic claudication   . Thoracic or lumbosacral neuritis or radiculitis, unspecified   . Chronic pain syndrome     Past Surgical History:  Procedure Laterality Date  . BACK SURGERY    . back surgey     times 3  . IR INJECT/THERA/INC NEEDLE/CATH/PLC EPI/LUMB/SAC W/IMG  11/26/2020       Family History  Problem Relation Age of Onset  . Cancer Father   . Heart disease Father   . Stroke Mother   . Diabetes Mother     Social History   Tobacco Use  . Smoking status: Former Research scientist (life sciences)  .  Smokeless tobacco: Never Used  Substance Use Topics  . Alcohol use: No  . Drug use: No    Home Medications Prior to Admission medications   Medication Sig Start Date End Date Taking? Authorizing Provider  acetaminophen (TYLENOL) 500 MG tablet Take 2 tablets (1,000 mg total) by mouth every 6 (six) hours as needed for headache, mild pain or fever. 11/28/20   Dwyane Dee, MD  albuterol (VENTOLIN HFA) 108 (90 Base) MCG/ACT inhaler Inhale 2 puffs into the lungs every 4 (four) hours as needed for wheezing or shortness of breath. 11/03/20   Pokhrel, Corrie Mckusick, MD  amLODipine (NORVASC) 10 MG tablet Take 1 tablet (10 mg total) by mouth daily. 11/29/20   Dwyane Dee, MD  cloNIDine (CATAPRES) 0.1 MG tablet Take 0.1 mg by mouth 2 (two) times daily.     [provider]  diclofenac Sodium (VOLTAREN) 1 % GEL Apply 2 g topically 4 (four) times daily as needed for pain. 01/02/20   [provider]  DULoxetine (CYMBALTA) 30 MG capsule Take 1 capsule (30 mg total) by mouth 3 (three) times daily. 11/28/20   Dwyane Dee, MD  ferrous sulfate 325 (65 FE) MG tablet Take 325 mg by mouth daily.     [provider]  gabapentin (NEURONTIN) 300 MG capsule Take 1 capsule (300 mg total) by mouth 3 (three) times daily. 11/28/20   Dwyane Dee, MD  hydroxychloroquine (PLAQUENIL) 200 MG tablet Take 200 mg by mouth 2 (two) times daily.    [provider]  lidocaine (LIDODERM) 5 % Place 1 patch onto the skin daily. Remove & Discard patch within 12 hours or as directed by MD. Right chest 11/03/20   Pokhrel, Corrie Mckusick, MD  linaclotide (LINZESS) 72 MCG capsule Take 72 mcg by mouth daily as needed (constipation).    [provider]  loperamide (IMODIUM A-D) 2 MG tablet Take 1 tablet (2 mg total) by mouth 4 (four) times daily as needed for diarrhea or loose stools. 07/04/20   Georgette Shell, MD  LORazepam (ATIVAN) 1 MG tablet Take 1 tablet (1 mg total) by mouth every 8 (eight) hours as  needed for anxiety. 11/28/20   Dwyane Dee, MD  methocarbamol (ROBAXIN) 500 MG tablet Take 1 tablet (500 mg total) by mouth every 6 (six) hours as needed for muscle spasms. 11/28/20   Dwyane Dee, MD  Multiple Vitamin (MULTIVITAMIN WITH MINERALS) TABS tablet Take 1 tablet by mouth daily. 11/29/20   Dwyane Dee, MD  ondansetron (ZOFRAN) 4 MG tablet Take 1 tablet (4 mg total) by mouth every 6 (six) hours as needed for nausea. 07/04/20   Georgette Shell, MD  oxyCODONE (OXY IR/ROXICODONE) 5 MG immediate release tablet Take 1 tablet (5 mg total) by mouth every 6 (six) hours as needed for severe pain. 11/28/20   Girguis,  Shanon Brow, MD  pantoprazole (PROTONIX) 40 MG tablet Take 1 tablet (40 mg total) by mouth daily. 11/29/20   Dwyane Dee, MD  polyethylene glycol (MIRALAX / GLYCOLAX) 17 g packet Take 17 g by mouth daily as needed for mild constipation. 11/03/20   Pokhrel, Corrie Mckusick, MD  rosuvastatin (CRESTOR) 40 MG tablet Take 40 mg by mouth every evening.     [provider]  tamsulosin (FLOMAX) 0.4 MG CAPS capsule Take 0.4 mg by mouth daily.    [provider]    Allergies    Cefepime, Iohexol, Fentanyl, and Iodine  Review of Systems   Review of Systems  Constitutional: Negative for appetite change, chills and fever.  HENT: Negative for ear pain, rhinorrhea, sneezing and sore throat.   Eyes: Negative for photophobia and visual disturbance.  Respiratory: Positive for cough. Negative for chest tightness, shortness of breath and wheezing.   Cardiovascular: Negative for chest pain and palpitations.  Gastrointestinal: Negative for abdominal pain, blood in stool, constipation, diarrhea, nausea and vomiting.  Genitourinary: Positive for dysuria. Negative for hematuria and urgency.  Musculoskeletal: Positive for myalgias.  Skin: Negative for rash.  Neurological: Negative for dizziness, weakness and light-headedness.    Physical Exam Updated Vital Signs BP (!) 178/141   Pulse 97    Temp 98.6 F (37 C) (Oral)   Resp (!) 23   SpO2 96%   Physical Exam Vitals and nursing note reviewed.  Constitutional:      General: He is not in acute distress.    Appearance: He is well-developed and well-nourished.  HENT:     Head: Normocephalic and atraumatic.     Nose: Nose normal.  Eyes:     General: No scleral icterus.       Left eye: No discharge.     Extraocular Movements: EOM normal.     Conjunctiva/sclera: Conjunctivae normal.  Cardiovascular:     Rate and Rhythm: Normal rate and regular rhythm.     Pulses: Intact distal pulses.     Heart sounds: Normal heart sounds. No murmur heard. No friction rub. No gallop.   Pulmonary:     Effort: Pulmonary effort is normal. No respiratory distress.     Breath sounds: Normal breath sounds.  Abdominal:     General: Bowel sounds are normal. There is no distension.     Palpations: Abdomen is soft.     Tenderness: There is no abdominal tenderness. There is no guarding.  Musculoskeletal:        General: No edema. Normal range of motion.     Cervical back: Normal range of motion and neck supple.     Comments: Moving all extremities.  Reports pain with movement of bilateral arms and legs.  Normal sensation to light touch of bilateral upper and lower extremities.  2+ radial and DP pulses noted bilaterally.  Skin:    General: Skin is warm and dry.     Findings: No rash.  Neurological:     Mental Status: He is alert.     Motor: No abnormal muscle tone.     Coordination: Coordination normal.  Psychiatric:        Mood and Affect: Mood and affect normal.     ED Results / Procedures / Treatments   Labs (all labs ordered are listed, but only abnormal results are displayed) Labs Reviewed  COMPREHENSIVE METABOLIC PANEL - Abnormal; Notable for the following components:      Result Value   Potassium 3.3 (*)  Glucose, Bld 114 (*)    Total Protein 6.3 (*)    All other components within normal limits  CBC WITH DIFFERENTIAL/PLATELET  - Abnormal; Notable for the following components:   WBC 11.8 (*)    RBC 3.80 (*)    Hemoglobin 11.9 (*)    HCT 35.9 (*)    Platelets 422 (*)    Neutro Abs 8.8 (*)    Monocytes Absolute 1.1 (*)    Abs Immature Granulocytes 0.18 (*)    All other components within normal limits  CK - Abnormal; Notable for the following components:   Total CK 27 (*)    All other components within normal limits  RESP PANEL BY RT-PCR (FLU A&B, COVID) [id number]  URINE CULTURE  CULTURE, BLOOD (ROUTINE X 2)  CULTURE, BLOOD (ROUTINE X 2)  URINALYSIS, ROUTINE W REFLEX MICROSCOPIC  TROPONIN I (HIGH SENSITIVITY)  TROPONIN I (HIGH SENSITIVITY)    EKG EKG Interpretation  Date/Time:  Thursday January 07 2021 12:35:15 EST Ventricular Rate:  98 PR Interval:    QRS Duration: 80 QT Interval:  344 QTC Calculation: 440 R Axis:   -43 Text Interpretation: Sinus rhythm Short PR interval Abnormal R-wave progression, early transition Inferior infarct, old No significant change since last tracing Confirmed by [name] [phone]) on 01/07/2021 12:39:24 PM   Radiology DG Chest Portable 1 View  Result Date: 01/07/2021 CLINICAL DATA:  80 year old male with a history of cough EXAM: PORTABLE CHEST 1 VIEW COMPARISON:  11/11/2020 FINDINGS: Cardiomediastinal silhouette unchanged in size and contour. No pneumothorax or pleural effusion. Low lung volumes persist with mild coarsened interstitial markings. No confluent airspace disease. No displaced fracture. Degenerative changes of the bilateral shoulders. IMPRESSION: Negative for acute cardiopulmonary disease with persisting low lung volumes. Electronically Signed   By: Corrie Mckusick D.O.   On: 01/07/2021 13:52    Procedures Procedures   Medications Ordered in ED Medications  hydrocortisone sodium succinate (SOLU-CORTEF) injection 200 mg (has no administration in time range)  diphenhydrAMINE (BENADRYL) capsule 50 mg (has no administration in time range)    Or   diphenhydrAMINE (BENADRYL) injection 50 mg (has no administration in time range)  HYDROmorphone (DILAUDID) injection 0.5 mg (0.5 mg Intravenous Given 01/07/21 1253)  HYDROmorphone (DILAUDID) injection 0.5 mg (0.5 mg Intravenous Given 01/07/21 1411)    ED Course  I have reviewed the triage vital signs and the nursing notes.  Pertinent labs & imaging results that were available during my care of the patient were reviewed by me and considered in my medical decision making (see chart for details).  Clinical Course as of 01/07/21 1544  Thu Jan 07, 2021  1320 WBC(!): 11.8 [HK]  1341 CK Total(!): 27 [HK]  1341 Troponin I (High Sensitivity): 9 [HK]  1420 SARS Coronavirus 2 by RT PCR: NEGATIVE [HK]    Clinical Course User Index [HK] Delia Heady, PA-C   MDM Rules/Calculators/A&P                          80 year old male with past medical history of type 2 diabetes, chronic back pain presenting to the ED with a chief complaint of myalgias and dysuria. Reports generalized weakness possible and ?neuropathy in bilateral arms and legs for the past week. Patient admitted and discharge within the past 2 months for sepsis in the setting of UTI with possible GI etiology. Given spinal injection for his chronic pain at that point but states that his pain has not improved.  He takes oxycodone at home. He is also concerned that he has a UTI as he has had UTIs in the past. Reports dysuria. Denies any recent injuries, states that the last time he fell about 2 months ago, this worsened his bilateral shoulder pain and back pain. He uses a motorized scooter to get around at home. On exam patient with normal sensation of all extremities. He is moving his extremities but has pain with movement of all of his extremities. No deformities noted. No C-spine tenderness noted. Abdomen is soft. He is afebrile here without recent use of antipyretics. No signs of respiratory distress. EKG here shows sinus rhythm, no changes from  prior tracings, no STEMI. Chest x-ray without any acute findings. CK is normal. CBC is unremarkable. CMP is unremarkable. Initial troponin is 9. Will attempt to control pain here while awaiting delta troponin, urinalysis and Covid testing which abnormalities could explain his symptoms today. Due to patient's generalized pain including back pain will obtain dissection study after discussion with attending to rule out dissection as the cause of his symptoms. Care involved oncoming provider pending recheck after remainder of work-up. Anticipate he may need outpatient resources or [facility] consult for SNF placement if he prefers.   Portions of this note were generated with [employer]. Dictation errors may occur despite best attempts at proofreading.  Final Clinical Impression(s) / ED Diagnoses Final diagnoses:  Other chronic pain    Rx / DC Orders ED Discharge Orders    None       Delia Heady, PA-C 01/07/21 1544    [name], MD 01/08/21 [phone]

## 2021-01-07 NOTE — ED Notes (Signed)
Male pure wick in place will give urine when able.

## 2021-01-07 NOTE — ED Provider Notes (Addendum)
Care assumed from Dr [facility], PA-C at shift change with Urine and CTA chest/abd/pelvis pending.   In brief, this patient is a 80 y.o. M with PMH/o DMII, chronic lower back pain who presents for evaluation of myalgias and dysuria. He also reports generalized weakness x 2 weeks. Patient also endorses generalized chronic pain. He is on oxycodone which he has been taking. No chest pain, fever, abdominal pain. Please see note from previous provider for full history/physical exam.    Physical Exam  BP (!) 160/114   Pulse 96   Temp 98.7 F (37.1 C) (Rectal)   Resp 17   SpO2 93%   Physical Exam  Abdomen is soft, non-distended, non-tender. No rigidity, No guarding. No peritoneal signs. 2+ Radial, DP pulses    ED Course/Procedures   Clinical Course as of 01/07/21 2317  Thu Jan 07, 2021  1320 WBC(!): 11.8 [HK]  1341 CK Total(!): 27 [HK]  1341 Troponin I (High Sensitivity): 9 [HK]  1420 SARS Coronavirus 2 by RT PCR: NEGATIVE [HK]  2231 Mucus: PRESENT [LL]    Clinical Course User Index [HK] Delia Heady, PA-C [LL] Volanda Napoleon, PA-C    Procedures  Results for orders placed or performed during the hospital encounter of 01/07/21 (from the past 24 hour(s))  Comprehensive metabolic panel     Status: Abnormal   Collection Time: 01/07/21 12:54 PM  Result Value Ref Range   Sodium 139 135 - 145 mmol/L   Potassium 3.3 (L) 3.5 - 5.1 mmol/L   Chloride 103 98 - 111 mmol/L   CO2 26 22 - 32 mmol/L   Glucose, Bld 114 (H) 70 - 99 mg/dL   BUN 9 8 - 23 mg/dL   Creatinine, Ser 0.61 0.61 - 1.24 mg/dL   Calcium 9.4 8.9 - 10.3 mg/dL   Total Protein 6.3 (L) 6.5 - 8.1 g/dL   Albumin 3.7 3.5 - 5.0 g/dL   AST 15 15 - 41 U/L   ALT 16 0 - 44 U/L   Alkaline Phosphatase 64 38 - 126 U/L   Total Bilirubin 0.6 0.3 - 1.2 mg/dL   GFR, Estimated >60 >60 mL/min   Anion gap 10 5 - 15  CBC with Differential     Status: Abnormal   Collection Time: 01/07/21 12:54 PM  Result Value Ref Range   WBC 11.8 (H)  4.0 - 10.5 K/uL   RBC 3.80 (L) 4.22 - 5.81 MIL/uL   Hemoglobin 11.9 (L) 13.0 - 17.0 g/dL   HCT 35.9 (L) 39.0 - 52.0 %   MCV 94.5 80.0 - 100.0 fL   MCH 31.3 26.0 - 34.0 pg   MCHC 33.1 30.0 - 36.0 g/dL   RDW 14.5 11.5 - 15.5 %   Platelets 422 (H) 150 - 400 K/uL   nRBC 0.0 0.0 - 0.2 %   Neutrophils Relative % 75 %   Neutro Abs 8.8 (H) 1.7 - 7.7 K/uL   Lymphocytes Relative 13 %   Lymphs Abs 1.6 0.7 - 4.0 K/uL   Monocytes Relative 9 %   Monocytes Absolute 1.1 (H) 0.1 - 1.0 K/uL   Eosinophils Relative 0 %   Eosinophils Absolute 0.0 0.0 - 0.5 K/uL   Basophils Relative 1 %   Basophils Absolute 0.1 0.0 - 0.1 K/uL   Immature Granulocytes 2 %   Abs Immature Granulocytes 0.18 (H) 0.00 - 0.07 K/uL  CK     Status: Abnormal   Collection Time: 01/07/21 12:54 PM  Result Value Ref Range  Total CK 27 (L) 49 - 397 U/L  Troponin I (High Sensitivity)     Status: None   Collection Time: 01/07/21 12:54 PM  Result Value Ref Range   Troponin I (High Sensitivity) 9 <18 ng/L  Resp Panel by RT-PCR (Flu A&B, Covid) Nasopharyngeal Swab     Status: None   Collection Time: 01/07/21  1:35 PM   Specimen: Nasopharyngeal Swab; Nasopharyngeal(NP) swabs in vial transport medium  Result Value Ref Range   SARS Coronavirus 2 by RT PCR NEGATIVE NEGATIVE   Influenza A by PCR NEGATIVE NEGATIVE   Influenza B by PCR NEGATIVE NEGATIVE  Urinalysis, Routine w reflex microscopic     Status: Abnormal   Collection Time: 01/07/21  4:04 PM  Result Value Ref Range   Color, Urine YELLOW YELLOW   APPearance HAZY (A) CLEAR   Specific Gravity, Urine 1.016 1.005 - 1.030   pH 5.0 5.0 - 8.0   Glucose, UA NEGATIVE NEGATIVE mg/dL   Hgb urine dipstick NEGATIVE NEGATIVE   Bilirubin Urine NEGATIVE NEGATIVE   Ketones, ur 5 (A) NEGATIVE mg/dL   Protein, ur 30 (A) NEGATIVE mg/dL   Nitrite NEGATIVE NEGATIVE   Leukocytes,Ua NEGATIVE NEGATIVE   RBC / HPF 0-5 0 - 5 RBC/hpf   WBC, UA 11-20 0 - 5 WBC/hpf   Bacteria, UA MANY (A) NONE SEEN    Squamous Epithelial / LPF 0-5 0 - 5   Mucus PRESENT   Troponin I (High Sensitivity)     Status: None   Collection Time: 01/07/21  4:04 PM  Result Value Ref Range   Troponin I (High Sensitivity) 10 <18 ng/L    MDM    PLAN: F/u UA and CTA dissection study.   MDM: Trop x 2 negative. Respiratory panel is negative. CK is 27. CMP is 3.3, BUN and Cr is normal. CBC shows slight leukocytosis of 11.8, Hgb of 11.9. CXR is negative. UA has small bacteria, pyruia. No leuks, nitrites. Likely colonoization. Discussed patient with Dr. Tyrone Nine. Urine culture sent.   CTA of chest abdomen pelvis have been ordered for evaluation of dissection.  Patient gets hives from the iodine so he has been given pretreatment and will have to wait.  I went to reevaluate patient.  He states he was hurting all over.  He was slightly diaphoretic.  He states he is cold.  Patient states he has been here for a long time and I discussed with patient that we are waiting for his CTA. Patient given pain medication and ativan. RN and I performed a rectal temp and it was within normal limits (98.7).   7:33 PM: Attempted to update patient's daughter but was unable to get her on the phone.   CTA shows no evidence of aortic aneurysm or dissection.  He has subtle area of focal wall thickening involving ascending colon could be infectious versus inflammatory colitis.  Reevaluation.  When I passed the room, patient seems to be sleeping but when I talked to him, he starts complaining of diffuse pain again.  On my evaluation, his abdomen is benign.  I have had an extensive discussion along with Dr. Tyrone Nine regarding patient's work-up here in the ED.  At this time, no indication for admission at this time.  Patient is adamant that he cannot go home.  He lives by himself.  He uses a motorized scooter to get around.  He is adamant that because of his pain, chronic issues, he cannot go home.  I discussed with him  that at this time, no indication for  admission.  Patient had been offered SNF placement in the past but declined.  We discussed that if he does not feel safe to go home, we will observe him in the ED overnight and social work can discuss with him tomorrow morning regarding further placement options, such as SNF.   Updated patient's daughter. We will have social work plan to consult on patient tomorrow. Patient resting comfortably at this time. She states that patient has been working with PCP and Education officer, museum at [employer] for potential placement and asks that our Education officer, museum contacts them.   1. Other chronic pain     Portions of this note were generated with Dragon dictation software. Dictation errors may occur despite best attempts at proofreading.    Volanda Napoleon, PA-C 01/07/21 2237    Deno Etienne, DO 01/07/21 2307    Volanda Napoleon, PA-C 01/07/21 2325    Volanda Napoleon, PA-C 01/07/21 [place], [place], DO 01/12/21 [phone]

## 2021-01-07 NOTE — ED Triage Notes (Signed)
Patient presents from home with dysuria, body aches, chills and increased weakness for the last 2 weeks. He also complains of shoulder pain and leg pain. Patient took Tylenol earlier today. Hx Chronic UTI, Chronic pain   EMS vitals: 160/90 BP 100 HR 20 RR 98% SPO2 117 CBG

## 2021-01-07 NOTE — ED Notes (Signed)
Patient's Daughter updated on patient status.

## 2021-01-07 NOTE — ED Notes (Signed)
Attempted in and out cath on patient with no success.

## 2021-01-07 NOTE — ED Notes (Signed)
Daughter [name] would like to be called with any and all updates.

## 2021-01-08 MED ORDER — CLONIDINE HCL 0.1 MG PO TABS
0.1000 mg | ORAL_TABLET | Freq: Two times a day (BID) | ORAL | Status: DC
  Administered 2021-01-08 (×2): 0.1 mg via ORAL
  Filled 2021-01-08 (×2): qty 1

## 2021-01-08 MED ORDER — PANTOPRAZOLE SODIUM 40 MG PO TBEC
40.0000 mg | DELAYED_RELEASE_TABLET | Freq: Every day | ORAL | Status: DC
  Administered 2021-01-08: 40 mg via ORAL
  Filled 2021-01-08: qty 1

## 2021-01-08 MED ORDER — LINACLOTIDE 72 MCG PO CAPS
72.0000 ug | ORAL_CAPSULE | Freq: Every day | ORAL | Status: DC | PRN
  Filled 2021-01-08: qty 1

## 2021-01-08 MED ORDER — LORAZEPAM 1 MG PO TABS
1.0000 mg | ORAL_TABLET | Freq: Three times a day (TID) | ORAL | Status: DC | PRN
  Administered 2021-01-08 (×2): 1 mg via ORAL
  Filled 2021-01-08 (×2): qty 1

## 2021-01-08 MED ORDER — GABAPENTIN 300 MG PO CAPS
300.0000 mg | ORAL_CAPSULE | Freq: Three times a day (TID) | ORAL | Status: DC
  Administered 2021-01-08 (×3): 300 mg via ORAL
  Filled 2021-01-08 (×3): qty 1

## 2021-01-08 MED ORDER — DULOXETINE HCL 30 MG PO CPEP
30.0000 mg | ORAL_CAPSULE | Freq: Three times a day (TID) | ORAL | Status: DC
  Administered 2021-01-08 (×3): 30 mg via ORAL
  Filled 2021-01-08 (×3): qty 1

## 2021-01-08 MED ORDER — FERROUS SULFATE 325 (65 FE) MG PO TABS
325.0000 mg | ORAL_TABLET | Freq: Every day | ORAL | Status: DC
  Administered 2021-01-08: 325 mg via ORAL
  Filled 2021-01-08: qty 1

## 2021-01-08 MED ORDER — ROSUVASTATIN CALCIUM 20 MG PO TABS
40.0000 mg | ORAL_TABLET | Freq: Every evening | ORAL | Status: DC
  Administered 2021-01-08: 40 mg via ORAL
  Filled 2021-01-08: qty 2

## 2021-01-08 MED ORDER — ALBUTEROL SULFATE HFA 108 (90 BASE) MCG/ACT IN AERS: 2.0000 | INHALATION_SPRAY | RESPIRATORY_TRACT | Status: DC | PRN

## 2021-01-08 MED ORDER — TAMSULOSIN HCL 0.4 MG PO CAPS
0.4000 mg | ORAL_CAPSULE | Freq: Every day | ORAL | Status: DC
  Administered 2021-01-08: 0.4 mg via ORAL
  Filled 2021-01-08: qty 1

## 2021-01-08 MED ORDER — AMLODIPINE BESYLATE 5 MG PO TABS
10.0000 mg | ORAL_TABLET | Freq: Every day | ORAL | Status: DC
  Administered 2021-01-08: 10 mg via ORAL
  Filled 2021-01-08: qty 2

## 2021-01-08 MED ORDER — HYDROXYCHLOROQUINE SULFATE 200 MG PO TABS
200.0000 mg | ORAL_TABLET | Freq: Two times a day (BID) | ORAL | Status: DC
  Administered 2021-01-08 (×2): 200 mg via ORAL
  Filled 2021-01-08 (×2): qty 1

## 2021-01-08 MED ORDER — OXYCODONE HCL 5 MG PO TABS
5.0000 mg | ORAL_TABLET | Freq: Four times a day (QID) | ORAL | Status: DC | PRN
  Administered 2021-01-08 – 2021-01-09 (×4): 5 mg via ORAL
  Filled 2021-01-08 (×4): qty 1

## 2021-01-08 NOTE — ED Provider Notes (Signed)
Patient discharged.  Home health aide has been arranged.  Will eventually get placed to a SNF.   Lennice Sites, DO 01/08/21 2029

## 2021-01-08 NOTE — ED Notes (Signed)
Patient placed on hospital bed and PT at bedside.

## 2021-01-08 NOTE — ED Notes (Signed)
Resting in bed. Awaiting transport back home with [facility]

## 2021-01-08 NOTE — ED Notes (Signed)
PT notified, again regarding order for evaluation

## 2021-01-08 NOTE — ED Notes (Signed)
LM for PT regarding order/eval

## 2021-01-08 NOTE — ED Notes (Signed)
Care assumed. Report received from [place], [place]

## 2021-01-08 NOTE — Progress Notes (Addendum)
630 pm TOC CM received call from pt's dtr, Gina. States she talked with brother. She wanted pt transported by [facility] home. The aide will be at the home at 8 pm to care for pt. And she and brother will work on getting permanent caregiver arranged until he is accepted at [facility]. Gave her information on how to apply for  Medicaid [place]. She can start application online or apply at [place] office (which is closed on Monday due to holiday). ED provider and RN updated. Will send message to [place], [place]. Pt is dc home with HH and HH CSW to continue search for SNF placement. Jonnie Finner RN CCM, [facility] ED TOC CM [phone]  435 pm TOC CM spoke to [facility] rep, [place]. States pt is active and they will continue to follow pt when dc home for [facility]. States CSW has sent referral to SNF in area and pt was denied. Pt was reevaluated by [facility]. Pt will need to pay copay and apply for Medicaid. Contacted dtr, Gina while in room with patient on speaker. Dtr states she works and brother, pt's HCPOA lives out of state and works. They are not able to provide 24 hour care. States they hired an independent caregiver but due to pt care needs, she was not able to continue assignment. Explained to dtr, [name], pt will need to dc home with [facility] and private duty caregivers. [facility] agency will continue to work with pt and family in getting SNF placement. She would have to apply for his medicaid and tap into his financial resources/assets to help cover cost of an in home caregivers. States she will contact her brother and give TOC CM call back. She requested information on agency that offer caregiver services. Provided her with info for [facility] that maybe able to come out in 24 hours. Jonnie Finner RN CCM, [facility] ED TOC CM [phone]  425 pm Spoke to [place] with [facility], pt will need to be reassessed once he has finished his SNF rehab prior to coming to ALF. Jonnie Finner RN CCM, [facility] ED  TOC CM [phone]  419 pm Attempted call to [place], [place], [phone]. They were working on getting pt placed in facility at [facility]. Left HIPAA complaint voice message for follow up call. Per dtr, [name]. Pt has been accepted at [facility] on [phone] but pt must be able to transfer himself.  [place], [place] ED TOC CM [phone]

## 2021-01-08 NOTE — ED Notes (Signed)
Called PT to put patient on their schedule ASAP.

## 2021-01-08 NOTE — ED Notes (Signed)
Attempt to call pt daughter and son to confirm address for d/c with [facility]. No response. Will try again in 30 minute to an hour

## 2021-01-08 NOTE — ED Notes (Signed)
[facility] called for pt transport. 

## 2021-01-08 NOTE — Progress Notes (Signed)
..   Transition of Care [facility]) - Emergency Department Mini Assessment   Patient Details  Name: Jorge Sherman MRN: [id number] Date of Birth: December 25, 1940  Transition of Care [facility]) CM/SW Contact:    Erenest Rasher, RN Phone Number: [phone] 01/08/2021, 4:11 PM   Clinical Narrative:  TOC CM spoke to pt's dtr, [name]. States pt was living at home alone with caregivers and had HH with [facility]. States he has IT trainer wheelchair at home. States [place] agency was in the process of ordering more DME for home. Dtr gave permission to create [facility] and fax referral for SNF placement. Waiting PT evaluation.      ED Mini Assessment: What brought you to the Emergency Department? : weakness  Barriers to Discharge: Family Issues,Unsafe home situation,SNF Pending bed offer        Interventions which prevented an admission or readmission: [place] or Services    Patient Contact and Communications        ,            CMS Medicare.gov Compare Post Acute Care list provided to:: Patient Represenative (must comment) (daughter, Rune Mendez) Choice offered to / list presented to : Adult Children  Admission diagnosis:  dysuria Patient Active Problem List   Diagnosis Date Noted  . Hyponatremia 11/11/2020  . Falls 10/30/2020  . Dehydration with hyponatremia 10/29/2020  . Fall at home, initial encounter 10/29/2020  . Musculoskeletal chest pain 10/29/2020  . Mixed hyperlipidemia due to type 2 diabetes mellitus ([place]) 10/29/2020  . Benign prostatic hyperplasia without lower urinary tract symptoms 10/29/2020  . Ileus ([place])   . Declining functional status   . Goals of care, counseling/discussion   . Palliative care by specialist   . AKI (acute kidney injury) ([place]) 06/27/2020  . Acute diarrhea 06/27/2020  . Altered mental status 08/28/2019  . Septic joint of left knee joint ([place]) 04/22/2019  . Erectile dysfunction 10/30/2013  . Essential hypertension, benign 10/30/2013  .  Coronary artery disease involving native coronary artery of native heart without angina pectoris 10/30/2013  . Dizziness and giddiness   . Pain in joint, lower leg   . Type 2 diabetes mellitus without complication, with long-term current use of insulin ([place])   . Obstructive sleep apnea   . Depressive disorder, not elsewhere classified   . Essential and other specified forms of tremor   . Headache(784.0)   . Spinal stenosis, lumbar region, without neurogenic claudication   . Thoracic or lumbosacral neuritis or radiculitis, unspecified   . Chronic pain syndrome    PCP:  Merrilee Seashore, MD Pharmacy:   [place], [place] [address] Phone: [phone] Fax: [phone]

## 2021-01-08 NOTE — NC FL2 (Signed)
[place] LEVEL OF CARE SCREENING TOOL     IDENTIFICATION  Patient Name: Jorge Sherman Birthdate: Jan 17, 1941 Sex: male Admission Date (Current Location): 01/07/2021  [facility] and [place] Number:  Herbalist and Address:  [facility],  [place] [address], [place]      Provider Number: [id number]  Attending Physician Name and Address:  Default, Provider, MD  Relative Name and Phone Number:  Boyde Grieco # [phone]    Current Level of Care: Hospital Recommended Level of Care: [place] Prior Approval Number:    Date Approved/Denied:   PASRR Number: [id number]  Discharge Plan: SNF    Current Diagnoses: Patient Active Problem List   Diagnosis Date Noted  . Hyponatremia 11/11/2020  . Falls 10/30/2020  . Dehydration with hyponatremia 10/29/2020  . Fall at home, initial encounter 10/29/2020  . Musculoskeletal chest pain 10/29/2020  . Mixed hyperlipidemia due to type 2 diabetes mellitus ([place]) 10/29/2020  . Benign prostatic hyperplasia without lower urinary tract symptoms 10/29/2020  . Ileus ([place])   . Declining functional status   . Goals of care, counseling/discussion   . Palliative care by specialist   . AKI (acute kidney injury) ([place]) 06/27/2020  . Acute diarrhea 06/27/2020  . Altered mental status 08/28/2019  . Septic joint of left knee joint ([place]) 04/22/2019  . Erectile dysfunction 10/30/2013  . Essential hypertension, benign 10/30/2013  . Coronary artery disease involving native coronary artery of native heart without angina pectoris 10/30/2013  . Dizziness and giddiness   . Pain in joint, lower leg   . Type 2 diabetes mellitus without complication, with long-term current use of insulin ([place])   . Obstructive sleep apnea   . Depressive disorder, not elsewhere classified   . Essential and other specified forms of tremor   . Headache(784.0)   . Spinal stenosis, lumbar region, without neurogenic  claudication   . Thoracic or lumbosacral neuritis or radiculitis, unspecified   . Chronic pain syndrome     Orientation RESPIRATION BLADDER Height & Weight     Self,Time,Situation,Place  Normal Incontinent Weight: 5'11" Height:  109 kg  BEHAVIORAL SYMPTOMS/MOOD NEUROLOGICAL BOWEL NUTRITION STATUS      Continent Diet  AMBULATORY STATUS COMMUNICATION OF NEEDS Skin   Extensive Assist Verbally Normal                       Personal Care Assistance Level of Assistance  Bathing,Feeding,Dressing Bathing Assistance: Maximum assistance Feeding assistance: Limited assistance Dressing Assistance: Maximum assistance     Functional Limitations Info  Sight,Hearing,Speech Sight Info: Adequate Hearing Info: Adequate Speech Info: Adequate    SPECIAL CARE FACTORS FREQUENCY  PT (By licensed PT),OT (By licensed OT)     PT Frequency: 5x per week OT Frequency: 5x per week            Contractures Contractures Info: Not present    Additional Factors Info  Code Status,Allergies Code Status Info: DNR Allergies Info: cefepime, iohexol, fentanyl, iodine           Current Medications (01/08/2021):  This is the current hospital active medication list Current Facility-Administered Medications  Medication Dose Route Frequency Provider Last Rate Last Admin  . albuterol (VENTOLIN HFA) 108 (90 Base) MCG/ACT inhaler 2 puff  2 puff Inhalation Q4H PRN Maudie Flakes, MD      . amLODipine (NORVASC) tablet 10 mg  10 mg Oral Daily Maudie Flakes, MD   10  mg at 01/08/21 0928  . cloNIDine (CATAPRES) tablet 0.1 mg  0.1 mg Oral BID Maudie Flakes, MD   0.1 mg at 01/08/21 9326  . DULoxetine (CYMBALTA) DR capsule 30 mg  30 mg Oral TID Maudie Flakes, MD   30 mg at 01/08/21 7124  . ferrous sulfate tablet 325 mg  325 mg Oral Daily Maudie Flakes, MD   325 mg at 01/08/21 5809  . gabapentin (NEURONTIN) capsule 300 mg  300 mg Oral TID Maudie Flakes, MD   300 mg at 01/08/21 9833  .  hydroxychloroquine (PLAQUENIL) tablet 200 mg  200 mg Oral BID Maudie Flakes, MD   200 mg at 01/08/21 8250  . linaclotide (LINZESS) capsule 72 mcg  72 mcg Oral Daily PRN Maudie Flakes, MD      . LORazepam (ATIVAN) tablet 1 mg  1 mg Oral Q8H PRN Maudie Flakes, MD   1 mg at 01/08/21 1238  . oxyCODONE (Oxy IR/ROXICODONE) immediate release tablet 5 mg  5 mg Oral Q6H PRN Maudie Flakes, MD   5 mg at 01/08/21 1238  . pantoprazole (PROTONIX) EC tablet 40 mg  40 mg Oral Daily Maudie Flakes, MD   40 mg at 01/08/21 0929  . rosuvastatin (CRESTOR) tablet 40 mg  40 mg Oral QPM Maudie Flakes, MD      . tamsulosin [facility]) capsule 0.4 mg  0.4 mg Oral Daily Maudie Flakes, MD   0.4 mg at 01/08/21 5397   Current Outpatient Medications  Medication Sig Dispense Refill  . acetaminophen (TYLENOL) 500 MG tablet Take 2 tablets (1,000 mg total) by mouth every 6 (six) hours as needed for headache, mild pain or fever. 30 tablet 0  . albuterol (VENTOLIN HFA) 108 (90 Base) MCG/ACT inhaler Inhale 2 puffs into the lungs every 4 (four) hours as needed for wheezing or shortness of breath. 1 each 0  . amLODipine (NORVASC) 10 MG tablet Take 1 tablet (10 mg total) by mouth daily.    [name] atorvastatin (LIPITOR) 80 MG tablet Take 80 mg by mouth daily.    . cloNIDine (CATAPRES) 0.1 MG tablet Take 0.1 mg by mouth 2 (two) times daily.     . diclofenac Sodium (VOLTAREN) 1 % GEL Apply 2 g topically 4 (four) times daily as needed for pain.    . DULoxetine (CYMBALTA) 30 MG capsule Take 1 capsule (30 mg total) by mouth 3 (three) times daily.  3  . ferrous sulfate 325 (65 FE) MG tablet Take 325 mg by mouth daily.     [name] gabapentin (NEURONTIN) 300 MG capsule Take 1 capsule (300 mg total) by mouth 3 (three) times daily.    . hydroxychloroquine (PLAQUENIL) 200 MG tablet Take 200 mg by mouth 2 (two) times daily.    [name] lidocaine (LIDODERM) 5 % Place 1 patch onto the skin daily. Remove & Discard patch within 12 hours or as directed by MD.  Right chest 30 patch 1  . linaclotide (LINZESS) 72 MCG capsule Take 72 mcg by mouth daily as needed (constipation).    [name] loperamide (IMODIUM A-D) 2 MG tablet Take 1 tablet (2 mg total) by mouth 4 (four) times daily as needed for diarrhea or loose stools. 30 tablet 0  . LORazepam (ATIVAN) 1 MG tablet Take 1 tablet (1 mg total) by mouth every 8 (eight) hours as needed for anxiety. 15 tablet 0  . methocarbamol (ROBAXIN) 500 MG tablet Take 1 tablet (500 mg  total) by mouth every 6 (six) hours as needed for muscle spasms. (Patient taking differently: Take 500 mg by mouth 3 (three) times daily as needed for muscle spasms.)    . Multiple Vitamin (MULTIVITAMIN WITH MINERALS) TABS tablet Take 1 tablet by mouth daily.    . ondansetron (ZOFRAN) 4 MG tablet Take 1 tablet (4 mg total) by mouth every 6 (six) hours as needed for nausea. 20 tablet 0  . oxyCODONE (OXY IR/ROXICODONE) 5 MG immediate release tablet Take 1 tablet (5 mg total) by mouth every 6 (six) hours as needed for severe pain. 20 tablet 0  . pantoprazole (PROTONIX) 40 MG tablet Take 1 tablet (40 mg total) by mouth daily.    . polyethylene glycol (MIRALAX / GLYCOLAX) 17 g packet Take 17 g by mouth daily as needed for mild constipation. 14 each 0  . rosuvastatin (CRESTOR) 40 MG tablet Take 40 mg by mouth every evening.     . tamsulosin (FLOMAX) 0.4 MG CAPS capsule Take 0.4 mg by mouth daily.    . traMADol (ULTRAM) 50 MG tablet Take 50 mg by mouth every 6 (six) hours as needed for pain.       Discharge Medications: Please see discharge summary for a list of discharge medications.  Relevant Imaging Results:  Relevant Lab Results:   Additional Information SS#  [id number]  Erenest Rasher, RN

## 2021-01-08 NOTE — Evaluation (Signed)
Physical Therapy Evaluation Patient Details Name: Jorge Sherman MRN: [id number] DOB: October 23, 1941 Today's Date: 01/08/2021   History of Present Illness  Jorge Sherman is a 80 y.o. male with a past medical history of type 2 diabetes, chronic pain in lower back, HTN presenting to the ED with a chief complaint of myalgias and dysuria, eeports generalized weakness for the past 2 weeks.  Clinical Impression  Pt admitted with above diagnosis. Pt reports having 24 hr care aides at home, but recently they were paid upfront and he hasn't seen them in 2 days so his neighbor called EMS to bring him to the hospital. Pt previously requiring some assist with ADLs, using electric scooter for mobility, and completing "scoot on my butt" with transfers. Pt a&o x4 and pleasant, but difficult to follow during conversation. Pt currently requiring mod A to sit up at EOB, min G to return to supine and heavy cues with only bed assist in trendelenburg to scoot up in bed and reposition to comfort. Pt's goal is to walk again, though refuses to stand or ambulate with therapist due to fear of L knee buckling and being cold. Pt currently with functional limitations due to the deficits listed below (see PT Problem List). Pt will benefit from skilled PT to increase their independence and safety with mobility to allow discharge to the venue listed below.       Follow Up Recommendations SNF    Equipment Recommendations  None recommended by PT    Recommendations for Other Services       Precautions / Restrictions Precautions Precautions: Fall Restrictions Weight Bearing Restrictions: No      Mobility  Bed Mobility Overal bed mobility: Needs Assistance Bed Mobility: Supine to Sit;Sit to Supine  Supine to sit: Mod assist;HOB elevated Sit to supine: Min guard   General bed mobility comments: mod A to upirght trunk with HOB elevated and scoot out to EOB, pt pulling on therapist's hand to assist in scooting out; min G  to lift BLE and return to supine; placed bed in trendelenburg and cued pt into hooklying position to push through bil feet and scoot up to [facility] without physical assistance    Transfers  General transfer comment: pt refuses despite multiple attempts, reports "my left knee will buckle under me" and "I'm too cold"  Ambulation/Gait  General Gait Details: pt refuses to attempt despite encouragement and education  Stairs            Wheelchair Mobility    Modified Rankin (Stroke Patients Only)       Balance Overall balance assessment: History of Falls (pt reports multiple falls at home, sometimes EMS comes to get him off the floor)         Pertinent Vitals/Pain Pain Assessment: 0-10 Pain Score: 10-Worst pain ever Pain Location: "both shoulders and back" Pain Descriptors / Indicators: Aching;Sore ("my shoulders are both broken") Pain Intervention(s): Limited activity within patient's tolerance;Monitored during session;Repositioned    Home Living Family/patient expects to be discharged to:: Private residence Living Arrangements: Alone Available Help at Discharge: Personal care attendant;Available 24 hours/day Type of Home: Other(Comment) Home Access: Level entry     Home Layout: One level Home Equipment: Walker - 2 wheels;Electric scooter;Other (comment) (adjustable bed)      Prior Function Level of Independence: Needs assistance   Gait / Transfers Assistance Needed: Pt reports sliding into electric scooter from bed, unable to verbalize bathroom transfer  ADL's / Homemaking Assistance Needed: Pt reports aide sits  outside door while he showers and fixes meals occasionally  Comments: Pt reports had personal aide 24 hrs/day 7 days a week, but they "got paid upfront" and haven't been back since.     Hand Dominance   Dominant Hand: Right    Extremity/Trunk Assessment   Upper Extremity Assessment Upper Extremity Assessment: Generalized weakness    Lower Extremity  Assessment Lower Extremity Assessment: Generalized weakness;LLE deficits/detail;RLE deficits/detail RLE Deficits / Details: AROM WNL and strength 3+/5 throughout; pt reports numbness throughout without specific area LLE Deficits / Details: hip and ankle 3+/5 with AROM WNL; knee 2+/5 with AROM ~50%; pt reports numbness throughout without specific area    Cervical / Trunk Assessment Cervical / Trunk Assessment: Kyphotic  Communication   Communication: No difficulties  Cognition Arousal/Alertness: Awake/alert Behavior During Therapy: WFL for tasks assessed/performed Overall Cognitive Status: Within Functional Limits for tasks assessed  General Comments: pt A&O x4      General Comments      Exercises     Assessment/Plan    PT Assessment Patient needs continued PT services  PT Problem List Decreased strength;Decreased range of motion;Decreased activity tolerance;Decreased balance;Decreased mobility;Decreased knowledge of use of DME;Decreased safety awareness;Pain       PT Treatment Interventions DME instruction;Gait training;Functional mobility training;Therapeutic activities;Therapeutic exercise;Balance training;Neuromuscular re-education;Patient/family education;Wheelchair mobility training    PT Goals (Current goals can be found in the Care Plan section)  Acute Rehab PT Goals Patient Stated Goal: "I want to walk again" PT Goal Formulation: With patient Time For Goal Achievement: 01/22/21 Potential to Achieve Goals: Fair    Frequency Min 2X/week   Barriers to discharge        Co-evaluation               AM-PAC PT "6 Clicks" Mobility  Outcome Measure Help needed turning from your back to your side while in a flat bed without using bedrails?: A Little Help needed moving from lying on your back to sitting on the side of a flat bed without using bedrails?: A Little Help needed moving to and from a bed to a chair (including a wheelchair)?: Total Help needed standing  up from a chair using your arms (e.g., wheelchair or bedside chair)?: Total Help needed to walk in hospital room?: Total Help needed climbing 3-5 steps with a railing? : Total 6 Click Score: 10    End of Session   Activity Tolerance: Patient limited by pain Patient left: in bed;with call bell/phone within reach;with bed alarm set;with nursing/sitter in room Nurse Communication: Mobility status PT Visit Diagnosis: Unsteadiness on feet (R26.81);Other abnormalities of gait and mobility (R26.89);Repeated falls (R29.6);Muscle weakness (generalized) (M62.81)    Time: 8295-6213 PT Time Calculation (min) (ACUTE ONLY): 26 min   Charges:   PT Evaluation $PT Eval Low Complexity: 1 Low PT Treatments $Therapeutic Activity: 8-22 mins         Tori Cady Hafen PT, DPT 01/08/21, 2:17 PM

## 2021-01-08 NOTE — ED Notes (Signed)
Recalled PT because they have not signed up for this patient as of yet, requested again for them to sign up for this patient.

## 2021-01-09 LAB — URINE CULTURE

## 2021-01-09 MED ORDER — ACETAMINOPHEN 325 MG PO TABS
650.0000 mg | ORAL_TABLET | Freq: Once | ORAL | Status: AC
  Administered 2021-01-09: 650 mg via ORAL
  Filled 2021-01-09: qty 2

## 2021-01-09 NOTE — ED Notes (Signed)
Pt c/o headache. Provider notified and orders placed in [facility]

## 2021-01-09 NOTE — ED Notes (Signed)
[facility] arrives to take pt back to home

## 2021-01-09 NOTE — ED Notes (Signed)
Charge RN calling transport to see when they will arrive for pt transport home

## 2021-01-12 LAB — CULTURE, BLOOD (ROUTINE X 2)
Culture: NO GROWTH
Culture: NO GROWTH
Special Requests: ADEQUATE
Special Requests: ADEQUATE

## 2021-02-13 ENCOUNTER — Inpatient Hospital Stay (HOSPITAL_COMMUNITY)
Admission: EM | Admit: 2021-02-13 | Discharge: 2021-02-24 | DRG: 207 | Disposition: A | Discharge status: Skilled Nursing Facility | Payer: Medicare HMO | Source: Skilled Nursing Facility | Attending: Internal Medicine | Admitting: Internal Medicine

## 2021-02-13 DIAGNOSIS — G894 Chronic pain syndrome: Secondary | ICD-10-CM | POA: Diagnosis present

## 2021-02-13 DIAGNOSIS — R0602 Shortness of breath: Secondary | ICD-10-CM

## 2021-02-13 DIAGNOSIS — K219 Gastro-esophageal reflux disease without esophagitis: Secondary | ICD-10-CM | POA: Diagnosis present

## 2021-02-13 DIAGNOSIS — Z452 Encounter for adjustment and management of vascular access device: Secondary | ICD-10-CM

## 2021-02-13 DIAGNOSIS — R578 Other shock: Secondary | ICD-10-CM | POA: Diagnosis not present

## 2021-02-13 DIAGNOSIS — Z8249 Family history of ischemic heart disease and other diseases of the circulatory system: Secondary | ICD-10-CM

## 2021-02-13 DIAGNOSIS — J9602 Acute respiratory failure with hypercapnia: Secondary | ICD-10-CM | POA: Diagnosis present

## 2021-02-13 DIAGNOSIS — Z20822 Contact with and (suspected) exposure to covid-19: Secondary | ICD-10-CM | POA: Diagnosis present

## 2021-02-13 DIAGNOSIS — Z635 Disruption of family by separation and divorce: Secondary | ICD-10-CM

## 2021-02-13 DIAGNOSIS — Z885 Allergy status to narcotic agent status: Secondary | ICD-10-CM

## 2021-02-13 DIAGNOSIS — Z7189 Other specified counseling: Secondary | ICD-10-CM

## 2021-02-13 DIAGNOSIS — R4781 Slurred speech: Secondary | ICD-10-CM | POA: Diagnosis present

## 2021-02-13 DIAGNOSIS — Z28310 Unvaccinated for covid-19: Secondary | ICD-10-CM

## 2021-02-13 DIAGNOSIS — D638 Anemia in other chronic diseases classified elsewhere: Secondary | ICD-10-CM | POA: Diagnosis present

## 2021-02-13 DIAGNOSIS — R269 Unspecified abnormalities of gait and mobility: Secondary | ICD-10-CM | POA: Diagnosis present

## 2021-02-13 DIAGNOSIS — F32A Depression, unspecified: Secondary | ICD-10-CM | POA: Diagnosis present

## 2021-02-13 DIAGNOSIS — M4800 Spinal stenosis, site unspecified: Secondary | ICD-10-CM | POA: Diagnosis present

## 2021-02-13 DIAGNOSIS — E876 Hypokalemia: Secondary | ICD-10-CM | POA: Diagnosis present

## 2021-02-13 DIAGNOSIS — G4733 Obstructive sleep apnea (adult) (pediatric): Secondary | ICD-10-CM | POA: Diagnosis present

## 2021-02-13 DIAGNOSIS — R1310 Dysphagia, unspecified: Secondary | ICD-10-CM | POA: Diagnosis present

## 2021-02-13 DIAGNOSIS — Z79899 Other long term (current) drug therapy: Secondary | ICD-10-CM

## 2021-02-13 DIAGNOSIS — E8770 Fluid overload, unspecified: Secondary | ICD-10-CM | POA: Diagnosis present

## 2021-02-13 DIAGNOSIS — K5900 Constipation, unspecified: Secondary | ICD-10-CM | POA: Diagnosis present

## 2021-02-13 DIAGNOSIS — E1165 Type 2 diabetes mellitus with hyperglycemia: Secondary | ICD-10-CM | POA: Diagnosis present

## 2021-02-13 DIAGNOSIS — Z87891 Personal history of nicotine dependence: Secondary | ICD-10-CM

## 2021-02-13 DIAGNOSIS — Z91041 Radiographic dye allergy status: Secondary | ICD-10-CM

## 2021-02-13 DIAGNOSIS — F419 Anxiety disorder, unspecified: Secondary | ICD-10-CM | POA: Diagnosis present

## 2021-02-13 DIAGNOSIS — R7989 Other specified abnormal findings of blood chemistry: Secondary | ICD-10-CM | POA: Diagnosis present

## 2021-02-13 DIAGNOSIS — G250 Essential tremor: Secondary | ICD-10-CM | POA: Diagnosis present

## 2021-02-13 DIAGNOSIS — I10 Essential (primary) hypertension: Secondary | ICD-10-CM | POA: Diagnosis present

## 2021-02-13 DIAGNOSIS — J9601 Acute respiratory failure with hypoxia: Secondary | ICD-10-CM

## 2021-02-13 DIAGNOSIS — Z79891 Long term (current) use of opiate analgesic: Secondary | ICD-10-CM

## 2021-02-13 DIAGNOSIS — E873 Alkalosis: Secondary | ICD-10-CM | POA: Diagnosis present

## 2021-02-13 DIAGNOSIS — R1114 Bilious vomiting: Secondary | ICD-10-CM | POA: Diagnosis present

## 2021-02-13 DIAGNOSIS — Z833 Family history of diabetes mellitus: Secondary | ICD-10-CM

## 2021-02-13 DIAGNOSIS — R5381 Other malaise: Secondary | ICD-10-CM | POA: Diagnosis present

## 2021-02-13 DIAGNOSIS — Z881 Allergy status to other antibiotic agents status: Secondary | ICD-10-CM

## 2021-02-13 DIAGNOSIS — J690 Pneumonitis due to inhalation of food and vomit: Principal | ICD-10-CM | POA: Diagnosis present

## 2021-02-13 MED ORDER — NALOXONE HCL 0.4 MG/ML IJ SOLN
0.2000 mg | Freq: Once | INTRAMUSCULAR | Status: AC
  Administered 2021-02-14: 0.2 mg via INTRAVENOUS
  Filled 2021-02-13: qty 1

## 2021-02-13 MED ORDER — ONDANSETRON HCL 4 MG/2ML IJ SOLN
4.0000 mg | Freq: Once | INTRAMUSCULAR | Status: AC
  Administered 2021-02-14: 4 mg via INTRAVENOUS
  Filled 2021-02-13: qty 2

## 2021-02-13 NOTE — ED Triage Notes (Signed)
Pt BIB EMS from [facility] facility. Pt vomited this morning and again this evening. Noted to be in 80s on RA after vomiting at facility before EMS was called. Arrives to [facility] on [address]L [place] (room air at baseline). Per EMS, pt states he is in pain but does not indicate locations of pain.   EMS vitals: 130/86 Pulse 90 96% 3L [place] CBG 168

## 2021-02-13 NOTE — ED Provider Notes (Addendum)
[facility] EMERGENCY DEPARTMENT Provider Note  CSN: [id number] Arrival date & time: 02/13/21 2335  Chief Complaint(s) No chief complaint on file.  HPI Jorge Sherman is a 80 y.o. male with a past medical history listed below including chronic pain and OSA here from nursing facility for shortness of breath and hypoxia with sats in the 80s.  Patient reportedly had a bout of emesis early in the morning and one tonight.  1 day checked on him tonight after the bout of emesis this evening they noted that he was hypoxic with sats in the 80s.  EMS was called.  Patient was placed on 3 L nasal cannula and satting well.  CBG within normal limits.  Remainder of history, ROS, and physical exam limited due to patient's condition (AMS). Additional information was obtained from EMS.   Level V Caveat.    HPI  Past Medical History Past Medical History:  Diagnosis Date  . Chronic pain   . Depressive disorder, not elsewhere classified   . Dizziness and giddiness   . Essential and other specified forms of tremor   . Headache(784.0)   . Leukocytosis, unspecified   . Obstructive sleep apnea (adult) (pediatric)   . Pain in joint, lower leg   . Septic joint of left knee joint ([place]) 04/22/2019  . Spinal stenosis, lumbar region, without neurogenic claudication   . Thoracic or lumbosacral neuritis or radiculitis, unspecified   . Type II or unspecified type diabetes mellitus without mention of complication, not stated as uncontrolled   . Unspecified cardiovascular disease   . Unspecified essential hypertension    Patient Active Problem List   Diagnosis Date Noted  . Hyponatremia 11/11/2020  . Falls 10/30/2020  . Dehydration with hyponatremia 10/29/2020  . Fall at home, initial encounter 10/29/2020  . Musculoskeletal chest pain 10/29/2020  . Mixed hyperlipidemia due to type 2 diabetes mellitus ([place]) 10/29/2020  . Benign prostatic hyperplasia without lower urinary tract symptoms  10/29/2020  . Ileus ([place])   . Declining functional status   . Goals of care, counseling/discussion   . Palliative care by specialist   . AKI (acute kidney injury) ([place]) 06/27/2020  . Acute diarrhea 06/27/2020  . Altered mental status 08/28/2019  . Septic joint of left knee joint ([place]) 04/22/2019  . Erectile dysfunction 10/30/2013  . Essential hypertension, benign 10/30/2013  . Coronary artery disease involving native coronary artery of native heart without angina pectoris 10/30/2013  . Dizziness and giddiness   . Pain in joint, lower leg   . Type 2 diabetes mellitus without complication, with long-term current use of insulin ([place])   . Obstructive sleep apnea   . Depressive disorder, not elsewhere classified   . Essential and other specified forms of tremor   . Headache(784.0)   . Spinal stenosis, lumbar region, without neurogenic claudication   . Thoracic or lumbosacral neuritis or radiculitis, unspecified   . Chronic pain syndrome    Home Medication(s) Prior to Admission medications   Medication Sig Start Date End Date Taking? Authorizing Provider  acetaminophen (TYLENOL) 500 MG tablet Take 2 tablets (1,000 mg total) by mouth every 6 (six) hours as needed for headache, mild pain or fever. 11/28/20   Dwyane Dee, MD  albuterol (VENTOLIN HFA) 108 (90 Base) MCG/ACT inhaler Inhale 2 puffs into the lungs every 4 (four) hours as needed for wheezing or shortness of breath. 11/03/20   Pokhrel, Corrie Mckusick, MD  amLODipine (NORVASC) 10 MG tablet Take 1 tablet (10 mg total)  by mouth daily. 11/29/20   Dwyane Dee, MD  atorvastatin (LIPITOR) 80 MG tablet Take 80 mg by mouth daily. 12/14/20   [provider]  cloNIDine (CATAPRES) 0.1 MG tablet Take 0.1 mg by mouth 2 (two) times daily.     [provider]  diclofenac Sodium (VOLTAREN) 1 % GEL Apply 2 g topically 4 (four) times daily as needed for pain. 01/02/20   [provider]  DULoxetine (CYMBALTA) 30 MG capsule Take 1  capsule (30 mg total) by mouth 3 (three) times daily. 11/28/20   Dwyane Dee, MD  ferrous sulfate 325 (65 FE) MG tablet Take 325 mg by mouth daily.     [provider]  gabapentin (NEURONTIN) 300 MG capsule Take 1 capsule (300 mg total) by mouth 3 (three) times daily. 11/28/20   Dwyane Dee, MD  hydroxychloroquine (PLAQUENIL) 200 MG tablet Take 200 mg by mouth 2 (two) times daily.    [provider]  lidocaine (LIDODERM) 5 % Place 1 patch onto the skin daily. Remove & Discard patch within 12 hours or as directed by MD. Right chest 11/03/20   Pokhrel, Corrie Mckusick, MD  linaclotide (LINZESS) 72 MCG capsule Take 72 mcg by mouth daily as needed (constipation).    [provider]  loperamide (IMODIUM A-D) 2 MG tablet Take 1 tablet (2 mg total) by mouth 4 (four) times daily as needed for diarrhea or loose stools. 07/04/20   Georgette Shell, MD  LORazepam (ATIVAN) 1 MG tablet Take 1 tablet (1 mg total) by mouth every 8 (eight) hours as needed for anxiety. 11/28/20   Dwyane Dee, MD  methocarbamol (ROBAXIN) 500 MG tablet Take 1 tablet (500 mg total) by mouth every 6 (six) hours as needed for muscle spasms. Patient taking differently: Take 500 mg by mouth 3 (three) times daily as needed for muscle spasms. 11/28/20   Dwyane Dee, MD  Multiple Vitamin (MULTIVITAMIN WITH MINERALS) TABS tablet Take 1 tablet by mouth daily. 11/29/20   Dwyane Dee, MD  ondansetron (ZOFRAN) 4 MG tablet Take 1 tablet (4 mg total) by mouth every 6 (six) hours as needed for nausea. 07/04/20   Georgette Shell, MD  oxyCODONE (OXY IR/ROXICODONE) 5 MG immediate release tablet Take 1 tablet (5 mg total) by mouth every 6 (six) hours as needed for severe pain. 11/28/20   Dwyane Dee, MD  pantoprazole (PROTONIX) 40 MG tablet Take 1 tablet (40 mg total) by mouth daily. 11/29/20   Dwyane Dee, MD  polyethylene glycol (MIRALAX / GLYCOLAX) 17 g packet Take 17 g by mouth daily as needed for mild constipation.  11/03/20   Pokhrel, Corrie Mckusick, MD  rosuvastatin (CRESTOR) 40 MG tablet Take 40 mg by mouth every evening.     [provider]  tamsulosin (FLOMAX) 0.4 MG CAPS capsule Take 0.4 mg by mouth daily.    [provider]  traMADol (ULTRAM) 50 MG tablet Take 50 mg by mouth every 6 (six) hours as needed for pain. 12/14/20   [provider]  Past Surgical History Past Surgical History:  Procedure Laterality Date  . BACK SURGERY    . back surgey     times 3  . IR INJECT/THERA/INC NEEDLE/CATH/PLC EPI/LUMB/SAC W/IMG  11/26/2020   Family History Family History  Problem Relation Age of Onset  . Cancer Father   . Heart disease Father   . Stroke Mother   . Diabetes Mother     Social History Social History   Tobacco Use  . Smoking status: Former Research scientist (life sciences)  . Smokeless tobacco: Never Used  Substance Use Topics  . Alcohol use: No  . Drug use: No   Allergies Cefepime, Iohexol, Fentanyl, and Iodine  Review of Systems Review of Systems  Unable to perform ROS: Mental status change    Physical Exam Vital Signs  I have reviewed the triage vital signs BP (!) 153/75   Pulse 89   Temp 98.7 F (37.1 C) (Oral)   Resp 19   SpO2 97%   Physical Exam Vitals reviewed.  Constitutional:      General: He is not in acute distress.    Appearance: He is well-developed. He is not diaphoretic.  HENT:     Head: Normocephalic and atraumatic.     Nose: Nose normal.  Eyes:     General: No scleral icterus.       Right eye: No discharge.        Left eye: No discharge.     Conjunctiva/sclera: Conjunctivae normal.     Pupils: Pupils are equal, round, and reactive to light.     Comments: Pinpoint pupils   Cardiovascular:     Rate and Rhythm: Normal rate and regular rhythm.     Heart sounds: No murmur heard. No friction rub. No gallop.   Pulmonary:      Effort: Pulmonary effort is normal. No respiratory distress.     Breath sounds: Normal breath sounds. No stridor. No rales.  Abdominal:     General: There is no distension.     Palpations: Abdomen is soft.     Tenderness: There is no abdominal tenderness.  Musculoskeletal:        General: No tenderness.     Right upper arm: Edema present.     Left upper arm: Edema present.     Cervical back: Normal range of motion and neck supple.  Skin:    General: Skin is warm and dry.     Findings: No erythema or rash.  Neurological:     Mental Status: He is lethargic.     GCS: GCS eye subscore is 3. GCS verbal subscore is 5. GCS motor subscore is 5.     ED Results and Treatments Labs (all labs ordered are listed, but only abnormal results are displayed) Labs Reviewed  CBC WITH DIFFERENTIAL/PLATELET - Abnormal; Notable for the following components:      Result Value   WBC 14.6 (*)    RBC 3.72 (*)    Hemoglobin 11.8 (*)    HCT 35.6 (*)    Neutro Abs 11.9 (*)    Monocytes Absolute 1.5 (*)    Abs Immature Granulocytes 0.21 (*)    All other components within normal limits  BRAIN NATRIURETIC PEPTIDE - Abnormal; Notable for the following components:   B Natriuretic Peptide 290.6 (*)    All other components within normal limits  COMPREHENSIVE METABOLIC PANEL - Abnormal; Notable for the following components:   Sodium 128 (*)    Chloride 91 (*)    Glucose,  Bld 125 (*)    Total Protein 6.4 (*)    All other components within normal limits  I-STAT CHEM 8, ED - Abnormal; Notable for the following components:   Sodium 128 (*)    Chloride 89 (*)    Glucose, Bld 125 (*)    TCO2 35 (*)    Hemoglobin 12.6 (*)    HCT 37.0 (*)    All other components within normal limits  I-STAT VENOUS BLOOD GAS, ED - Abnormal; Notable for the following components:   pCO2, Ven 76.5 (*)    pO2, Ven 178.0 (*)    Bicarbonate 34.1 (*)    TCO2 36 (*)    Acid-Base Excess 5.0 (*)    Sodium 127 (*)    All other  components within normal limits  I-STAT VENOUS BLOOD GAS, ED - Abnormal; Notable for the following components:   pH, Ven 7.241 (*)    pCO2, Ven 84.4 (*)    pO2, Ven 140.0 (*)    Bicarbonate 36.2 (*)    TCO2 39 (*)    Acid-Base Excess 6.0 (*)    Sodium 127 (*)    HCT 37.0 (*)    Hemoglobin 12.6 (*)    All other components within normal limits  SARS CORONAVIRUS 2 (TAT 6-24 HRS)  URINALYSIS, ROUTINE W REFLEX MICROSCOPIC  BLOOD GAS, VENOUS                                                                                                                         EKG  EKG Interpretation  Date/Time:  Saturday February 13 2021 23:53:57 EDT Ventricular Rate:  92 PR Interval:    QRS Duration: 100 QT Interval:  375 QTC Calculation: 464 R Axis:   -45 Text Interpretation: Sinus rhythm LAD, consider left anterior fascicular block Low voltage, precordial leads Abnormal R-wave progression, late transition No acute changes Confirmed by Addison Lank [phone]) on 02/14/2021 1:13:05 AM      Radiology DG Chest Port 1 View  Result Date: 02/14/2021 CLINICAL DATA:  Shortness of breath EXAM: PORTABLE CHEST 1 VIEW COMPARISON:  Chest CT January 07, 2021 in chest radiograph January 07, 2021. FINDINGS: The heart size and mediastinal contours are unchanged. Low lung volumes with bibasilar subsegmental atelectasis. No significant pleural effusion or visible pneumothorax. Degenerative change of the left shoulder with osteochondral bodies and findings of avascular necrosis. IMPRESSION: Low lung volumes with bibasilar subsegmental atelectasis. Electronically Signed   By: Dahlia Bailiff MD   On: 02/14/2021 00:21    Pertinent labs & imaging results that were available during my care of the patient were reviewed by me and considered in my medical decision making (see chart for details).  Medications Ordered in ED Medications  albuterol (PROVENTIL,VENTOLIN) solution continuous neb (10 mg/hr Nebulization New Bag/Given  02/14/21 0504)  levofloxacin (LEVAQUIN) IVPB 500 mg (500 mg Intravenous New Bag/Given 02/14/21 0744)  naloxone [facility]) injection 0.2 mg (0.2 mg Intravenous Given 02/14/21 0102)  ondansetron (ZOFRAN) injection  4 mg (4 mg Intravenous Given 02/14/21 0057)  naloxone [facility]) injection 0.4 mg (0.4 mg Intravenous Given 02/14/21 0210)  ondansetron (ZOFRAN) injection 4 mg (4 mg Intravenous Given 02/14/21 0416)  naloxone [facility]) injection 0.4 mg ( Intravenous Given 02/14/21 0645)                                                                                                                                    Procedures .1-3 Lead EKG Interpretation Performed by: Fatima Blank, MD Authorized by: Fatima Blank, MD     Interpretation: normal     ECG rate:  95   ECG rate assessment: normal     Rhythm: sinus rhythm     Ectopy: none     Conduction: normal   .Critical Care Performed by: Fatima Blank, MD Authorized by: Fatima Blank, MD   Critical care provider statement:    Critical care time (minutes):  45   Critical care was necessary to treat or prevent imminent or life-threatening deterioration of the following conditions:  CNS failure or compromise and respiratory failure   Critical care was time spent personally by me on the following activities:  Discussions with consultants, evaluation of patient's response to treatment, examination of patient, ordering and performing treatments and interventions, ordering and review of laboratory studies, ordering and review of radiographic studies, pulse oximetry, re-evaluation of patient's condition, obtaining history from patient or surrogate and review of old charts   Care discussed with: admitting provider      (including critical care time)  Medical Decision Making / ED Course I have reviewed the nursing notes for this encounter and the patient's prior records (if available in EHR or on provided paperwork).   Jorge Sherman was evaluated in Emergency Department on 02/14/2021 for the symptoms described in the history of present illness. He was evaluated in the context of the global COVID-19 pandemic, which necessitated consideration that the patient might be at risk for infection with the SARS-CoV-2 virus that causes COVID-19. Institutional protocols and algorithms that pertain to the evaluation of patients at risk for COVID-19 are in a state of rapid change based on information released by regulatory bodies including the CDC and federal and state organizations. These policies and algorithms were followed during the patient's care in the ED.  Patient is lethargic but will respond to verbal stimuli.  Noted to have pinpoint pupils.  On review of records, patient has oxycodone and tramadol for chronic pain. Given low-dose of Narcan to which she initially responded. Patient on nasal cannula remain.  Chest x-ray without evidence of large aspiration.  They did note bibasilar atelectasis without overt pneumonia. EKG without acute ischemic changes Labs notable for leukocytosis.  Mild hyponatremia.  No renal insufficiency. VBG notable for hypercarbia.  Patient required additional dose of Narcan to which she again responded.  Given the hypercarbia, he was placed on BiPAP.  And  given Neb  On repeat VBG, CO2 is trending up.  BiPAP was adjusted by decreasing FiO2.  Patient's rate already in the 20s.  Patient given another dose of Narcan which he did not respond as well to.  Given the left responsiveness, attempted to call the patient's daughter but was unsuccessful.  On review of records patient was DNR as of December 9 of 2021 confirmed 2 additional times.  Patient admitted to stepdown unit for further work-up and management.  Levaquin added in case of unseen pneumonia.       Final Clinical Impression(s) / ED Diagnoses Final diagnoses:  SOB (shortness of breath)  Acute respiratory failure with hypoxia and  hypercarbia ([place])      This chart was dictated using voice recognition software.  Despite best efforts to proofread,  errors can occur which can change the documentation meaning.   Fatima Blank, MD 02/14/21 7615  Cristina Gong, MD 03/02/21 [phone]

## 2021-02-14 ENCOUNTER — Inpatient Hospital Stay (HOSPITAL_COMMUNITY): Payer: Medicare HMO

## 2021-02-14 ENCOUNTER — Emergency Department (HOSPITAL_COMMUNITY): Payer: Medicare HMO

## 2021-02-14 ENCOUNTER — Other Ambulatory Visit: Payer: Self-pay

## 2021-02-14 DIAGNOSIS — F419 Anxiety disorder, unspecified: Secondary | ICD-10-CM | POA: Diagnosis present

## 2021-02-14 DIAGNOSIS — J690 Pneumonitis due to inhalation of food and vomit: Secondary | ICD-10-CM | POA: Diagnosis present

## 2021-02-14 DIAGNOSIS — D638 Anemia in other chronic diseases classified elsewhere: Secondary | ICD-10-CM | POA: Diagnosis present

## 2021-02-14 DIAGNOSIS — R739 Hyperglycemia, unspecified: Secondary | ICD-10-CM | POA: Diagnosis not present

## 2021-02-14 DIAGNOSIS — R1310 Dysphagia, unspecified: Secondary | ICD-10-CM | POA: Diagnosis present

## 2021-02-14 DIAGNOSIS — R4781 Slurred speech: Secondary | ICD-10-CM | POA: Diagnosis present

## 2021-02-14 DIAGNOSIS — J9601 Acute respiratory failure with hypoxia: Secondary | ICD-10-CM | POA: Diagnosis present

## 2021-02-14 DIAGNOSIS — R1114 Bilious vomiting: Secondary | ICD-10-CM | POA: Diagnosis not present

## 2021-02-14 DIAGNOSIS — J9602 Acute respiratory failure with hypercapnia: Secondary | ICD-10-CM | POA: Diagnosis present

## 2021-02-14 DIAGNOSIS — F32A Depression, unspecified: Secondary | ICD-10-CM | POA: Diagnosis present

## 2021-02-14 DIAGNOSIS — R0602 Shortness of breath: Secondary | ICD-10-CM | POA: Diagnosis present

## 2021-02-14 DIAGNOSIS — E873 Alkalosis: Secondary | ICD-10-CM | POA: Diagnosis present

## 2021-02-14 DIAGNOSIS — G8929 Other chronic pain: Secondary | ICD-10-CM | POA: Diagnosis not present

## 2021-02-14 DIAGNOSIS — R7989 Other specified abnormal findings of blood chemistry: Secondary | ICD-10-CM | POA: Diagnosis present

## 2021-02-14 DIAGNOSIS — Z515 Encounter for palliative care: Secondary | ICD-10-CM | POA: Diagnosis not present

## 2021-02-14 DIAGNOSIS — Z28310 Unvaccinated for covid-19: Secondary | ICD-10-CM | POA: Diagnosis not present

## 2021-02-14 DIAGNOSIS — G894 Chronic pain syndrome: Secondary | ICD-10-CM | POA: Diagnosis present

## 2021-02-14 DIAGNOSIS — Z20822 Contact with and (suspected) exposure to covid-19: Secondary | ICD-10-CM | POA: Diagnosis present

## 2021-02-14 DIAGNOSIS — R578 Other shock: Secondary | ICD-10-CM | POA: Diagnosis not present

## 2021-02-14 DIAGNOSIS — Z7189 Other specified counseling: Secondary | ICD-10-CM | POA: Diagnosis not present

## 2021-02-14 DIAGNOSIS — G4733 Obstructive sleep apnea (adult) (pediatric): Secondary | ICD-10-CM | POA: Diagnosis present

## 2021-02-14 DIAGNOSIS — I10 Essential (primary) hypertension: Secondary | ICD-10-CM | POA: Diagnosis present

## 2021-02-14 DIAGNOSIS — G9341 Metabolic encephalopathy: Secondary | ICD-10-CM | POA: Diagnosis not present

## 2021-02-14 DIAGNOSIS — Z9911 Dependence on respirator [ventilator] status: Secondary | ICD-10-CM | POA: Diagnosis not present

## 2021-02-14 DIAGNOSIS — R5381 Other malaise: Secondary | ICD-10-CM | POA: Diagnosis present

## 2021-02-14 DIAGNOSIS — E876 Hypokalemia: Secondary | ICD-10-CM | POA: Diagnosis present

## 2021-02-14 LAB — URINALYSIS, ROUTINE W REFLEX MICROSCOPIC
Bilirubin Urine: NEGATIVE
Glucose, UA: NEGATIVE mg/dL
Hgb urine dipstick: NEGATIVE
Ketones, ur: 5 mg/dL — AB
Nitrite: NEGATIVE
Protein, ur: 100 mg/dL — AB
Specific Gravity, Urine: 1.021 (ref 1.005–1.030)
WBC, UA: >50 WBC/hpf — ABNORMAL HIGH (ref 0–5)
pH: 5 (ref 5.0–8.0)

## 2021-02-14 LAB — CBC WITH DIFFERENTIAL/PLATELET
Abs Immature Granulocytes: 0.21 10*3/uL — ABNORMAL HIGH (ref 0.00–0.07)
Abs Immature Granulocytes: 0.13 10*3/uL — ABNORMAL HIGH (ref 0.00–0.07)
Basophils Absolute: 0.1 10*3/uL (ref 0.0–0.1)
Basophils Absolute: 0 10*3/uL (ref 0.0–0.1)
Basophils Relative: 0 %
Basophils Relative: 0 %
Eosinophils Absolute: 0 10*3/uL (ref 0.0–0.5)
Eosinophils Absolute: 0 10*3/uL (ref 0.0–0.5)
Eosinophils Relative: 0 %
Eosinophils Relative: 0 %
HCT: 35.6 % — ABNORMAL LOW (ref 39.0–52.0)
HCT: 33.8 % — ABNORMAL LOW (ref 39.0–52.0)
Hemoglobin: 11.8 g/dL — ABNORMAL LOW (ref 13.0–17.0)
Hemoglobin: 10.8 g/dL — ABNORMAL LOW (ref 13.0–17.0)
Immature Granulocytes: 1 %
Immature Granulocytes: 1 %
Lymphocytes Relative: 6 %
Lymphocytes Relative: 5 %
Lymphs Abs: 0.9 10*3/uL (ref 0.7–4.0)
Lymphs Abs: 0.7 10*3/uL (ref 0.7–4.0)
MCH: 31.7 pg (ref 26.0–34.0)
MCH: 31 pg (ref 26.0–34.0)
MCHC: 33.1 g/dL (ref 30.0–36.0)
MCHC: 32 g/dL (ref 30.0–36.0)
MCV: 95.7 fL (ref 80.0–100.0)
MCV: 97.1 fL (ref 80.0–100.0)
Monocytes Absolute: 1.5 10*3/uL — ABNORMAL HIGH (ref 0.1–1.0)
Monocytes Absolute: 1.5 10*3/uL — ABNORMAL HIGH (ref 0.1–1.0)
Monocytes Relative: 11 %
Monocytes Relative: 10 %
Neutro Abs: 11.9 10*3/uL — ABNORMAL HIGH (ref 1.7–7.7)
Neutro Abs: 12.1 10*3/uL — ABNORMAL HIGH (ref 1.7–7.7)
Neutrophils Relative %: 82 %
Neutrophils Relative %: 84 %
Platelets: 353 10*3/uL (ref 150–400)
Platelets: 352 10*3/uL (ref 150–400)
RBC: 3.72 MIL/uL — ABNORMAL LOW (ref 4.22–5.81)
RBC: 3.48 MIL/uL — ABNORMAL LOW (ref 4.22–5.81)
RDW: 13.6 % (ref 11.5–15.5)
RDW: 13.5 % (ref 11.5–15.5)
WBC: 14.6 10*3/uL — ABNORMAL HIGH (ref 4.0–10.5)
WBC: 14.5 10*3/uL — ABNORMAL HIGH (ref 4.0–10.5)
nRBC: 0 % (ref 0.0–0.2)
nRBC: 0 % (ref 0.0–0.2)

## 2021-02-14 LAB — I-STAT VENOUS BLOOD GAS, ED
Acid-Base Excess: 5 mmol/L — ABNORMAL HIGH (ref 0.0–2.0)
Acid-Base Excess: 6 mmol/L — ABNORMAL HIGH (ref 0.0–2.0)
Acid-Base Excess: 7 mmol/L — ABNORMAL HIGH (ref 0.0–2.0)
Bicarbonate: 34.1 mmol/L — ABNORMAL HIGH (ref 20.0–28.0)
Bicarbonate: 36.2 mmol/L — ABNORMAL HIGH (ref 20.0–28.0)
Bicarbonate: 36.2 mmol/L — ABNORMAL HIGH (ref 20.0–28.0)
Calcium, Ion: 1.22 mmol/L (ref 1.15–1.40)
Calcium, Ion: 1.23 mmol/L (ref 1.15–1.40)
Calcium, Ion: 1.19 mmol/L (ref 1.15–1.40)
HCT: 39 % (ref 39.0–52.0)
HCT: 37 % — ABNORMAL LOW (ref 39.0–52.0)
HCT: 36 % — ABNORMAL LOW (ref 39.0–52.0)
Hemoglobin: 13.3 g/dL (ref 13.0–17.0)
Hemoglobin: 12.6 g/dL — ABNORMAL LOW (ref 13.0–17.0)
Hemoglobin: 12.2 g/dL — ABNORMAL LOW (ref 13.0–17.0)
O2 Saturation: 99 %
O2 Saturation: 98 %
O2 Saturation: 99 %
Potassium: 4.6 mmol/L (ref 3.5–5.1)
Potassium: 4.8 mmol/L (ref 3.5–5.1)
Potassium: 4.6 mmol/L (ref 3.5–5.1)
Sodium: 127 mmol/L — ABNORMAL LOW (ref 135–145)
Sodium: 127 mmol/L — ABNORMAL LOW (ref 135–145)
Sodium: 127 mmol/L — ABNORMAL LOW (ref 135–145)
TCO2: 36 mmol/L — ABNORMAL HIGH (ref 22–32)
TCO2: 39 mmol/L — ABNORMAL HIGH (ref 22–32)
TCO2: 38 mmol/L — ABNORMAL HIGH (ref 22–32)
pCO2, Ven: 76.5 mmHg (ref 44.0–60.0)
pCO2, Ven: 84.4 mmHg (ref 44.0–60.0)
pCO2, Ven: 76.5 mmHg (ref 44.0–60.0)
pH, Ven: 7.257 (ref 7.250–7.430)
pH, Ven: 7.241 — ABNORMAL LOW (ref 7.250–7.430)
pH, Ven: 7.282 (ref 7.250–7.430)
pO2, Ven: 178 mmHg — ABNORMAL HIGH (ref 32.0–45.0)
pO2, Ven: 140 mmHg — ABNORMAL HIGH (ref 32.0–45.0)
pO2, Ven: 148 mmHg — ABNORMAL HIGH (ref 32.0–45.0)

## 2021-02-14 LAB — COMPREHENSIVE METABOLIC PANEL
ALT: 11 U/L (ref 0–44)
ALT: 9 U/L (ref 0–44)
AST: 16 U/L (ref 15–41)
AST: 16 U/L (ref 15–41)
Albumin: 3.7 g/dL (ref 3.5–5.0)
Albumin: 3.2 g/dL — ABNORMAL LOW (ref 3.5–5.0)
Alkaline Phosphatase: 70 U/L (ref 38–126)
Alkaline Phosphatase: 60 U/L (ref 38–126)
Anion gap: 9 (ref 5–15)
Anion gap: 7 (ref 5–15)
BUN: 19 mg/dL (ref 8–23)
BUN: 15 mg/dL (ref 8–23)
CO2: 28 mmol/L (ref 22–32)
CO2: 29 mmol/L (ref 22–32)
Calcium: 9.4 mg/dL (ref 8.9–10.3)
Calcium: 9 mg/dL (ref 8.9–10.3)
Chloride: 91 mmol/L — ABNORMAL LOW (ref 98–111)
Chloride: 93 mmol/L — ABNORMAL LOW (ref 98–111)
Creatinine, Ser: 0.89 mg/dL (ref 0.61–1.24)
Creatinine, Ser: 0.69 mg/dL (ref 0.61–1.24)
GFR, Estimated: >60 mL/min (ref >60)
GFR, Estimated: >60 mL/min (ref >60)
Glucose, Bld: 125 mg/dL — ABNORMAL HIGH (ref 70–99)
Glucose, Bld: 131 mg/dL — ABNORMAL HIGH (ref 70–99)
Potassium: 4.7 mmol/L (ref 3.5–5.1)
Potassium: 4.4 mmol/L (ref 3.5–5.1)
Sodium: 128 mmol/L — ABNORMAL LOW (ref 135–145)
Sodium: 129 mmol/L — ABNORMAL LOW (ref 135–145)
Total Bilirubin: 0.6 mg/dL (ref 0.3–1.2)
Total Bilirubin: 0.7 mg/dL (ref 0.3–1.2)
Total Protein: 6.4 g/dL — ABNORMAL LOW (ref 6.5–8.1)
Total Protein: 5.8 g/dL — ABNORMAL LOW (ref 6.5–8.1)

## 2021-02-14 LAB — I-STAT CHEM 8, ED
BUN: 22 mg/dL (ref 8–23)
Calcium, Ion: 1.22 mmol/L (ref 1.15–1.40)
Chloride: 89 mmol/L — ABNORMAL LOW (ref 98–111)
Creatinine, Ser: 0.9 mg/dL (ref 0.61–1.24)
Glucose, Bld: 125 mg/dL — ABNORMAL HIGH (ref 70–99)
HCT: 37 % — ABNORMAL LOW (ref 39.0–52.0)
Hemoglobin: 12.6 g/dL — ABNORMAL LOW (ref 13.0–17.0)
Potassium: 4.6 mmol/L (ref 3.5–5.1)
Sodium: 128 mmol/L — ABNORMAL LOW (ref 135–145)
TCO2: 35 mmol/L — ABNORMAL HIGH (ref 22–32)

## 2021-02-14 LAB — POCT I-STAT EG7
Acid-Base Excess: 10 mmol/L — ABNORMAL HIGH (ref 0.0–2.0)
Bicarbonate: 34.4 mmol/L — ABNORMAL HIGH (ref 20.0–28.0)
Calcium, Ion: 1.25 mmol/L (ref 1.15–1.40)
HCT: 34 % — ABNORMAL LOW (ref 39.0–52.0)
Hemoglobin: 11.6 g/dL — ABNORMAL LOW (ref 13.0–17.0)
O2 Saturation: 66 %
Potassium: 3.6 mmol/L (ref 3.5–5.1)
Sodium: 133 mmol/L — ABNORMAL LOW (ref 135–145)
TCO2: 36 mmol/L — ABNORMAL HIGH (ref 22–32)
pCO2, Ven: 44.3 mmHg (ref 44.0–60.0)
pH, Ven: 7.498 — ABNORMAL HIGH (ref 7.250–7.430)
pO2, Ven: 31 mmHg — CL (ref 32.0–45.0)

## 2021-02-14 LAB — BLOOD GAS, VENOUS
Acid-Base Excess: 6.9 mmol/L — ABNORMAL HIGH (ref 0.0–2.0)
Bicarbonate: 31.4 mmol/L — ABNORMAL HIGH (ref 20.0–28.0)
FIO2: 30
O2 Saturation: 78 %
Patient temperature: 37.2
pCO2, Ven: 49.9 mmHg (ref 44.0–60.0)
pH, Ven: 7.417 (ref 7.250–7.430)
pO2, Ven: 41.7 mmHg (ref 32.0–45.0)

## 2021-02-14 LAB — GLUCOSE, CAPILLARY
Glucose-Capillary: 103 mg/dL — ABNORMAL HIGH (ref 70–99)
Glucose-Capillary: 100 mg/dL — ABNORMAL HIGH (ref 70–99)
Glucose-Capillary: 111 mg/dL — ABNORMAL HIGH (ref 70–99)
Glucose-Capillary: 122 mg/dL — ABNORMAL HIGH (ref 70–99)

## 2021-02-14 LAB — I-STAT ARTERIAL BLOOD GAS, ED
Acid-Base Excess: 7 mmol/L — ABNORMAL HIGH (ref 0.0–2.0)
Bicarbonate: 30.2 mmol/L — ABNORMAL HIGH (ref 20.0–28.0)
Calcium, Ion: 1.2 mmol/L (ref 1.15–1.40)
HCT: 30 % — ABNORMAL LOW (ref 39.0–52.0)
Hemoglobin: 10.2 g/dL — ABNORMAL LOW (ref 13.0–17.0)
O2 Saturation: 98 %
Patient temperature: 98.7
Potassium: 4.5 mmol/L (ref 3.5–5.1)
Sodium: 127 mmol/L — ABNORMAL LOW (ref 135–145)
TCO2: 31 mmol/L (ref 22–32)
pCO2 arterial: 38.6 mmHg (ref 32.0–48.0)
pH, Arterial: 7.502 — ABNORMAL HIGH (ref 7.350–7.450)
pO2, Arterial: 95 mmHg (ref 83.0–108.0)

## 2021-02-14 LAB — RAPID URINE DRUG SCREEN, HOSP PERFORMED
Amphetamines: NOT DETECTED
Barbiturates: NOT DETECTED
Benzodiazepines: NOT DETECTED
Cocaine: NOT DETECTED
Opiates: NOT DETECTED
Tetrahydrocannabinol: NOT DETECTED

## 2021-02-14 LAB — LACTIC ACID, PLASMA
Lactic Acid, Venous: 1.5 mmol/L (ref 0.5–1.9)
Lactic Acid, Venous: 1.1 mmol/L (ref 0.5–1.9)

## 2021-02-14 LAB — SARS CORONAVIRUS 2 (TAT 6-24 HRS): SARS Coronavirus 2: NEGATIVE

## 2021-02-14 LAB — MAGNESIUM: Magnesium: 1.9 mg/dL (ref 1.7–2.4)

## 2021-02-14 LAB — MRSA PCR SCREENING: MRSA by PCR: NEGATIVE

## 2021-02-14 LAB — BRAIN NATRIURETIC PEPTIDE: B Natriuretic Peptide: 290.6 pg/mL — ABNORMAL HIGH (ref 0.0–100.0)

## 2021-02-14 LAB — PHOSPHORUS: Phosphorus: 2.8 mg/dL (ref 2.5–4.6)

## 2021-02-14 MED ORDER — SODIUM CHLORIDE 0.9 % IV SOLN
3.0000 g | Freq: Four times a day (QID) | INTRAVENOUS | Status: AC
  Administered 2021-02-14 – 2021-02-18 (×18): 3 g via INTRAVENOUS
  Filled 2021-02-14 (×21): qty 8

## 2021-02-14 MED ORDER — POLYETHYLENE GLYCOL 3350 17 G PO PACK: 17.0000 g | PACK | Freq: Every day | ORAL | Status: DC | PRN

## 2021-02-14 MED ORDER — SODIUM CHLORIDE 0.9% FLUSH
10.0000 mL | Freq: Two times a day (BID) | INTRAVENOUS | Status: DC
  Administered 2021-02-14 – 2021-02-24 (×14): 10 mL

## 2021-02-14 MED ORDER — NALOXONE HCL 0.4 MG/ML IJ SOLN: 0.4000 mg | Freq: Once | INTRAMUSCULAR | Status: AC

## 2021-02-14 MED ORDER — PROPOFOL 1000 MG/100ML IV EMUL: 5.0000 ug/kg/min | INTRAVENOUS | Status: DC

## 2021-02-14 MED ORDER — PANTOPRAZOLE SODIUM 40 MG IV SOLR
40.0000 mg | Freq: Every day | INTRAVENOUS | Status: DC
  Administered 2021-02-14 – 2021-02-15 (×2): 40 mg via INTRAVENOUS
  Filled 2021-02-14 (×2): qty 40

## 2021-02-14 MED ORDER — ETOMIDATE 2 MG/ML IV SOLN
INTRAVENOUS | Status: AC | PRN
  Administered 2021-02-14: 20 mg via INTRAVENOUS

## 2021-02-14 MED ORDER — FENTANYL 2500MCG IN NS 250ML (10MCG/ML) PREMIX INFUSION
25.0000 ug/h | INTRAVENOUS | Status: DC
  Administered 2021-02-14: 50 ug/h via INTRAVENOUS
  Filled 2021-02-14: qty 250

## 2021-02-14 MED ORDER — IPRATROPIUM-ALBUTEROL 0.5-2.5 (3) MG/3ML IN SOLN
3.0000 mL | Freq: Three times a day (TID) | RESPIRATORY_TRACT | Status: DC
  Administered 2021-02-15 – 2021-02-22 (×22): 3 mL via RESPIRATORY_TRACT
  Filled 2021-02-14 (×21): qty 3

## 2021-02-14 MED ORDER — ETOMIDATE 2 MG/ML IV SOLN: 0.3000 mg/kg | Freq: Once | INTRAVENOUS | Status: DC

## 2021-02-14 MED ORDER — IPRATROPIUM-ALBUTEROL 0.5-2.5 (3) MG/3ML IN SOLN: 3.0000 mL | Freq: Four times a day (QID) | RESPIRATORY_TRACT | Status: DC

## 2021-02-14 MED ORDER — SODIUM CHLORIDE 0.9% FLUSH
10.0000 mL | INTRAVENOUS | Status: DC | PRN
  Administered 2021-02-23: 10 mL

## 2021-02-14 MED ORDER — ALBUTEROL (5 MG/ML) CONTINUOUS INHALATION SOLN
10.0000 mg/h | INHALATION_SOLUTION | RESPIRATORY_TRACT | Status: DC
  Administered 2021-02-14: 10 mg/h via RESPIRATORY_TRACT
  Filled 2021-02-14: qty 20

## 2021-02-14 MED ORDER — CHLORHEXIDINE GLUCONATE 0.12% ORAL RINSE (MEDLINE KIT)
15.0000 mL | Freq: Two times a day (BID) | OROMUCOSAL | Status: DC
  Administered 2021-02-14 – 2021-02-24 (×16): 15 mL via OROMUCOSAL

## 2021-02-14 MED ORDER — FENTANYL CITRATE (PF) 100 MCG/2ML IJ SOLN: 25.0000 ug | Freq: Once | INTRAMUSCULAR | Status: AC

## 2021-02-14 MED ORDER — PROPOFOL 1000 MG/100ML IV EMUL
INTRAVENOUS | Status: AC
  Administered 2021-02-14: 30 ug/kg/min via INTRAVENOUS
  Filled 2021-02-14: qty 100

## 2021-02-14 MED ORDER — MIDAZOLAM HCL 2 MG/2ML IJ SOLN
2.0000 mg | INTRAMUSCULAR | Status: AC | PRN
  Administered 2021-02-15 – 2021-02-16 (×3): 2 mg via INTRAVENOUS
  Filled 2021-02-14 (×5): qty 2

## 2021-02-14 MED ORDER — LEVOFLOXACIN IN D5W 500 MG/100ML IV SOLN
500.0000 mg | Freq: Once | INTRAVENOUS | Status: AC
  Administered 2021-02-14: 500 mg via INTRAVENOUS
  Filled 2021-02-14: qty 100

## 2021-02-14 MED ORDER — IPRATROPIUM-ALBUTEROL 0.5-2.5 (3) MG/3ML IN SOLN
3.0000 mL | RESPIRATORY_TRACT | Status: DC
  Administered 2021-02-14 (×2): 3 mL via RESPIRATORY_TRACT
  Filled 2021-02-14 (×2): qty 3

## 2021-02-14 MED ORDER — FENTANYL CITRATE (PF) 100 MCG/2ML IJ SOLN: 100.0000 ug | Freq: Once | INTRAMUSCULAR | Status: AC

## 2021-02-14 MED ORDER — DOCUSATE SODIUM 100 MG PO CAPS: 100.0000 mg | ORAL_CAPSULE | Freq: Two times a day (BID) | ORAL | Status: DC | PRN

## 2021-02-14 MED ORDER — ONDANSETRON HCL 4 MG/2ML IJ SOLN
4.0000 mg | Freq: Once | INTRAMUSCULAR | Status: AC
  Administered 2021-02-14: 4 mg via INTRAVENOUS
  Filled 2021-02-14: qty 2

## 2021-02-14 MED ORDER — ORAL CARE MOUTH RINSE
15.0000 mL | OROMUCOSAL | Status: DC
  Administered 2021-02-14 – 2021-02-19 (×49): 15 mL via OROMUCOSAL

## 2021-02-14 MED ORDER — FENTANYL CITRATE (PF) 100 MCG/2ML IJ SOLN
INTRAMUSCULAR | Status: AC
  Administered 2021-02-14: 100 ug via INTRAVENOUS
  Filled 2021-02-14: qty 2

## 2021-02-14 MED ORDER — FENTANYL BOLUS VIA INFUSION
25.0000 ug | INTRAVENOUS | Status: DC | PRN
  Administered 2021-02-15: 100 ug via INTRAVENOUS
  Administered 2021-02-15 (×5): 50 ug via INTRAVENOUS
  Filled 2021-02-14: qty 100

## 2021-02-14 MED ORDER — SUCCINYLCHOLINE CHLORIDE 20 MG/ML IJ SOLN: 1.5000 mg/kg | Freq: Once | INTRAMUSCULAR | Status: DC

## 2021-02-14 MED ORDER — CHLORHEXIDINE GLUCONATE CLOTH 2 % EX PADS
6.0000 | MEDICATED_PAD | Freq: Every day | CUTANEOUS | Status: DC
  Administered 2021-02-14 – 2021-02-24 (×10): 6 via TOPICAL

## 2021-02-14 MED ORDER — PROPOFOL 1000 MG/100ML IV EMUL
5.0000 ug/kg/min | INTRAVENOUS | Status: DC
  Administered 2021-02-14 (×2): 50 ug/kg/min via INTRAVENOUS
  Administered 2021-02-14 (×2): 70 ug/kg/min via INTRAVENOUS
  Administered 2021-02-14 – 2021-02-15 (×4): 50 ug/kg/min via INTRAVENOUS
  Administered 2021-02-15: 45 ug/kg/min via INTRAVENOUS
  Filled 2021-02-14 (×8): qty 100

## 2021-02-14 MED ORDER — NALOXONE HCL 0.4 MG/ML IJ SOLN
0.4000 mg | Freq: Once | INTRAMUSCULAR | Status: AC
  Administered 2021-02-14: 0.4 mg via INTRAVENOUS
  Filled 2021-02-14: qty 1

## 2021-02-14 MED ORDER — ROCURONIUM BROMIDE 50 MG/5ML IV SOLN
INTRAVENOUS | Status: AC | PRN
  Administered 2021-02-14: 50 mg via INTRAVENOUS

## 2021-02-14 MED ORDER — ONDANSETRON HCL 4 MG/2ML IJ SOLN
4.0000 mg | Freq: Four times a day (QID) | INTRAMUSCULAR | Status: DC | PRN
  Administered 2021-02-19: 4 mg via INTRAVENOUS
  Filled 2021-02-14: qty 2

## 2021-02-14 MED ORDER — MIDAZOLAM HCL 2 MG/2ML IJ SOLN
2.0000 mg | INTRAMUSCULAR | Status: DC | PRN
  Administered 2021-02-15 – 2021-02-17 (×7): 2 mg via INTRAVENOUS
  Filled 2021-02-14 (×5): qty 2

## 2021-02-14 MED ORDER — ENOXAPARIN SODIUM 40 MG/0.4ML ~~LOC~~ SOLN
40.0000 mg | SUBCUTANEOUS | Status: DC
  Administered 2021-02-14 – 2021-02-15 (×2): 40 mg via SUBCUTANEOUS
  Filled 2021-02-14 (×2): qty 0.4

## 2021-02-14 MED ORDER — POLYETHYLENE GLYCOL 3350 17 G PO PACK
17.0000 g | PACK | Freq: Every day | ORAL | Status: DC
  Administered 2021-02-15 – 2021-02-16 (×2): 17 g
  Filled 2021-02-14 (×2): qty 1

## 2021-02-14 MED ORDER — DOCUSATE SODIUM 50 MG/5ML PO LIQD
100.0000 mg | Freq: Two times a day (BID) | ORAL | Status: DC
  Administered 2021-02-15 – 2021-02-18 (×6): 100 mg
  Filled 2021-02-14 (×10): qty 10

## 2021-02-14 MED ORDER — NALOXONE HCL 0.4 MG/ML IJ SOLN
INTRAMUSCULAR | Status: AC
  Filled 2021-02-14: qty 1

## 2021-02-14 MED ORDER — NALOXONE HCL 4 MG/10ML IJ SOLN
0.5000 mg/h | INTRAVENOUS | Status: DC
  Filled 2021-02-14: qty 10

## 2021-02-14 NOTE — Progress Notes (Signed)
Patient's watch, wedding ring, and R-Handed ring are in a dentures container in patient's room. Patient label present on container, as well.

## 2021-02-14 NOTE — ED Notes (Signed)
Bladder scan performed with 44ml noted.

## 2021-02-14 NOTE — Procedures (Signed)
Intubation Procedure Note  Jorge Sherman  [id number]  1941/10/29  Date:02/14/21  Time:9:13 AM   Provider Performing:Koray Soter Carlean Jews Ammanda Dobbins, Assistant: Pati Gallo, PA   Procedure: Intubation (31500)  Indication(s) Respiratory Failure  Consent Unable to obtain consent due to emergent nature of procedure.  Anesthesia Etomidate and Rocuronium  Time Out Verified patient identification, verified procedure, site/side was marked, verified correct patient position, special equipment/implants available, medications/allergies/relevant history reviewed, required imaging and test results available.  Sterile Technique Usual hand hygeine, masks, and gloves were used  Procedure Description Patient positioned in bed supine.  Sedation given as noted above.  Patient was intubated with endotracheal tube using Glidescope.  View was Grade 1 full glottis .  Number of attempts was 1.  Colorimetric CO2 detector was consistent with tracheal placement.  Complications/Tolerance None; patient tolerated the procedure well. Chest X-ray is ordered to verify placement.  EBL 0cc  Specimen(s) None   Garner Nash, DO [facility] 02/14/2021 9:14 AM

## 2021-02-14 NOTE — ED Provider Notes (Signed)
.  Central Line  Date/Time: 02/14/2021 11:51 AM Performed by: Tedd Sias, PA Authorized by: Tedd Sias, PA   Consent:    Consent obtained:  Emergent situation Universal protocol:    Test results available: yes     Imaging studies available: yes     Required blood products, implants, devices, and special equipment available: yes     Site/side marked: yes     Immediately prior to procedure, a time out was called: yes     Patient identity confirmed:  Arm band Pre-procedure details:    Indication(s): central venous access     Hand hygiene: Hand hygiene performed prior to insertion     Sterile barrier technique: All elements of maximal sterile technique followed     Skin preparation:  Chlorhexidine   Skin preparation agent: Skin preparation agent completely dried prior to procedure   Sedation:    Sedation type:  Deep Anesthesia:    Anesthesia method:  Local infiltration   Local anesthetic:  Lidocaine 1% WITH epi Procedure details:    Location:  R internal jugular   Patient position:  Supine   Procedural supplies:  Triple lumen   Catheter size:  7 Fr   Landmarks identified: yes     Ultrasound guidance: yes     Sterile ultrasound techniques: Sterile gel and sterile probe covers were used     Number of attempts:  1   Successful placement: yes   Post-procedure details:    Post-procedure:  Dressing applied and line sutured   Assessment:  Blood return through all ports Comments:     Patient is in deep sedation and intubated.  Procedure was successful on first attempt.      Pati Gallo [place], [place] 02/14/21 1159    Isla Pence, MD 02/14/21 1535

## 2021-02-14 NOTE — Consult Note (Addendum)
ER Consult Note   Jorge Sherman DGL:[id number] DOB: [birth year] DOA: 02/13/2021  PCP: Merrilee Seashore, MD Consultants:  Pain Management Patient coming from: [facility]; NOK: Daughter, Lealand Elting, [phone]   Chief Complaint: Hypoxia  HPI: Jorge Sherman is a 80 y.o. male with medical history significant of HTN; DM; chronic pain; and depression presenting hypoxia after vomiting.  Per report, he vomited at his facility and was altered; they checked his O2 sat and it was in the 80s and so they transported.  Here in the ER, he was given Narcan x 2 with good response but did not respond to a 3rd dose.  I spoke with his daughter.  The SNF reported that he was having breathing issues.  He went one day last week to get Suboxone at the pain clinic; he was on this medication before and did fine.  She doesn't think he is supposed to be taking his own medications.  Despite prior DNR x 3, she now thinks he would want to be resuscitated.  She "would do everything possible to keep him alive".  He has been doing well with rehab and he is being assessed to move him to ALF.  I called PCCM to consult on the patient after discussion with his daughter, when she reported that he should be full code.  I also ordered repeat VBG and Narcan drip.  I went to check on the patient and his BIPAP mask was full of bright green emesis.  He was removed from the BIPAP and suctioned but is not alter enough to protect his airway and so will need intubation at this time.     ED Course: Vomiting x 2, hypoxia to 80s. AMS.  In 80s on arrival, placed on [place] O2.  Pinpoint pupils, ?overdose, given Narcan.  Effective x 2, most recent dose not effective.  VBG with pCO2 elevation, placed on BIPAP and getting worse (adjusting).  Patient is DNR.  Unable to reach family.  Given Levaquin for ?PNA (CXR normal).  Review of Systems: Unable to perform    Past Medical History:  Diagnosis Date  . Chronic pain   .  Depressive disorder, not elsewhere classified   . Dizziness and giddiness   . Essential and other specified forms of tremor   . Headache(784.0)   . Leukocytosis, unspecified   . Obstructive sleep apnea (adult) (pediatric)   . Pain in joint, lower leg   . Septic joint of left knee joint ([place]) 04/22/2019  . Spinal stenosis, lumbar region, without neurogenic claudication   . Thoracic or lumbosacral neuritis or radiculitis, unspecified   . Type II or unspecified type diabetes mellitus without mention of complication, not stated as uncontrolled   . Unspecified cardiovascular disease   . Unspecified essential hypertension     Past Surgical History:  Procedure Laterality Date  . BACK SURGERY    . back surgey     times 3  . IR INJECT/THERA/INC NEEDLE/CATH/PLC EPI/LUMB/SAC W/IMG  11/26/2020    Social History   Socioeconomic History  . Marital status: Legally Separated    Spouse name: Kennyth Lose  . Number of children: 4  . Years of education: Not on file  . Highest education level: Not on file  Occupational History    Employer: DISABLED  Tobacco Use  . Smoking status: Former Research scientist (life sciences)  . Smokeless tobacco: Never Used  Substance and Sexual Activity  . Alcohol use: No  . Drug use: No  . Sexual activity:  Not Currently  Other Topics Concern  . Not on file  Social History Narrative   ** Merged History Encounter **       Patient lives at home with wife.    Patient has 4 children.         Social Determinants of Health   Financial Resource Strain: Not on file  Food Insecurity: Not on file  Transportation Needs: Not on file  Physical Activity: Not on file  Stress: Not on file  Social Connections: Not on file  Intimate Partner Violence: Not on file    Allergies  Allergen Reactions  . Cefepime Rash    Other reaction(s): Other (See Comments), redness  . Iohexol Hives and Other (See Comments)    A few hives post CT Other reaction(s): hives, few, post CT  . Fentanyl Itching, Nausea  And Vomiting and Other (See Comments)    Patient states that it happened over 30 yrs ago  . Iodine Rash    Other reaction(s): Hives/Skin Rash    Family History  Problem Relation Age of Onset  . Cancer Father   . Heart disease Father   . Stroke Mother   . Diabetes Mother     Prior to Admission medications   Medication Sig Start Date End Date Taking? Authorizing Provider  acetaminophen (TYLENOL) 500 MG tablet Take 2 tablets (1,000 mg total) by mouth every 6 (six) hours as needed for headache, mild pain or fever. 11/28/20   Dwyane Dee, MD  albuterol (VENTOLIN HFA) 108 (90 Base) MCG/ACT inhaler Inhale 2 puffs into the lungs every 4 (four) hours as needed for wheezing or shortness of breath. 11/03/20   Pokhrel, Corrie Mckusick, MD  amLODipine (NORVASC) 10 MG tablet Take 1 tablet (10 mg total) by mouth daily. 11/29/20   Dwyane Dee, MD  atorvastatin (LIPITOR) 80 MG tablet Take 80 mg by mouth daily. 12/14/20   [provider]  cloNIDine (CATAPRES) 0.1 MG tablet Take 0.1 mg by mouth 2 (two) times daily.     [provider]  diclofenac Sodium (VOLTAREN) 1 % GEL Apply 2 g topically 4 (four) times daily as needed for pain. 01/02/20   [provider]  DULoxetine (CYMBALTA) 30 MG capsule Take 1 capsule (30 mg total) by mouth 3 (three) times daily. 11/28/20   Dwyane Dee, MD  ferrous sulfate 325 (65 FE) MG tablet Take 325 mg by mouth daily.     [provider]  gabapentin (NEURONTIN) 300 MG capsule Take 1 capsule (300 mg total) by mouth 3 (three) times daily. 11/28/20   Dwyane Dee, MD  hydroxychloroquine (PLAQUENIL) 200 MG tablet Take 200 mg by mouth 2 (two) times daily.    [provider]  lidocaine (LIDODERM) 5 % Place 1 patch onto the skin daily. Remove & Discard patch within 12 hours or as directed by MD. Right chest 11/03/20   Pokhrel, Corrie Mckusick, MD  linaclotide (LINZESS) 72 MCG capsule Take 72 mcg by mouth daily as needed (constipation).    [provider]  loperamide (IMODIUM A-D) 2 MG tablet Take 1 tablet (2 mg total) by mouth 4 (four) times daily as needed for diarrhea or loose stools. 07/04/20   Georgette Shell, MD  LORazepam (ATIVAN) 1 MG tablet Take 1 tablet (1 mg total) by mouth every 8 (eight) hours as needed for anxiety. 11/28/20   Dwyane Dee, MD  methocarbamol (ROBAXIN) 500 MG tablet Take 1 tablet (500 mg total) by mouth every 6 (six) hours as needed  for muscle spasms. Patient taking differently: Take 500 mg by mouth 3 (three) times daily as needed for muscle spasms. 11/28/20   Dwyane Dee, MD  Multiple Vitamin (MULTIVITAMIN WITH MINERALS) TABS tablet Take 1 tablet by mouth daily. 11/29/20   Dwyane Dee, MD  ondansetron (ZOFRAN) 4 MG tablet Take 1 tablet (4 mg total) by mouth every 6 (six) hours as needed for nausea. 07/04/20   Georgette Shell, MD  oxyCODONE (OXY IR/ROXICODONE) 5 MG immediate release tablet Take 1 tablet (5 mg total) by mouth every 6 (six) hours as needed for severe pain. 11/28/20   Dwyane Dee, MD  pantoprazole (PROTONIX) 40 MG tablet Take 1 tablet (40 mg total) by mouth daily. 11/29/20   Dwyane Dee, MD  polyethylene glycol (MIRALAX / GLYCOLAX) 17 g packet Take 17 g by mouth daily as needed for mild constipation. 11/03/20   Pokhrel, Corrie Mckusick, MD  rosuvastatin (CRESTOR) 40 MG tablet Take 40 mg by mouth every evening.     [provider]  tamsulosin (FLOMAX) 0.4 MG CAPS capsule Take 0.4 mg by mouth daily.    [provider]  traMADol (ULTRAM) 50 MG tablet Take 50 mg by mouth every 6 (six) hours as needed for pain. 12/14/20   [provider]    Physical Exam: Vitals:   02/14/21 0715 02/14/21 0729 02/14/21 0815 02/14/21 0845  BP: (!) 153/75  138/85 (!) 157/109  Pulse: 89  94 96  Resp: 19  19 20   Temp:      TempSrc:      SpO2: 100% 97% 99% 99%     . General:  Appears somnolent on BIPAP - opens eyes to touch, voice . Eyes: EOMI, normal lids, iris . ENT:  grossly  normal hearing, lips & tongue; on BIPAP . Neck:  no LAD, masses or thyromegaly . Cardiovascular:  RRR, no m/r/g. No LE edema.  [name] Respiratory:   CTA bilaterally with no wheezes/rales/rhonchi.  Normal respiratory effort on BIPAP. [name] Abdomen:  soft, mildly diffusely TTP, ND . Skin:  no rash or induration seen on limited exam . Musculoskeletal:  decreased tone BUE/BLE, no bony abnormality . Lower extremity:  No LE edema.  Limited foot exam with no ulcerations.  1+ distal pulses. [name] Psychiatric:  Intermittent moaning without sensical speech while on BIPAP . Neurologic:  Unable to perform    Radiological Exams on Admission: Independently reviewed - see discussion in A/P where applicable  DG Chest Port 1 View  Result Date: 02/14/2021 CLINICAL DATA:  Shortness of breath EXAM: PORTABLE CHEST 1 VIEW COMPARISON:  Chest CT January 07, 2021 in chest radiograph January 07, 2021. FINDINGS: The heart size and mediastinal contours are unchanged. Low lung volumes with bibasilar subsegmental atelectasis. No significant pleural effusion or visible pneumothorax. Degenerative change of the left shoulder with osteochondral bodies and findings of avascular necrosis. IMPRESSION: Low lung volumes with bibasilar subsegmental atelectasis. Electronically Signed   By: Dahlia Bailiff MD   On: 02/14/2021 00:21    EKG: Independently reviewed.  NSR with rate 92; no evidence of acute ischemia   Labs on Admission: I have personally reviewed the available labs and imaging studies at the time of the admission.  Pertinent labs:   Na++ 128; 139 on 2/17 Glucose 125 WBC 14.6; 11.8 on 2/17 Hgb 11.8 - stable VBG: 7.257/76.5/178/34.1 -> 7.241/84.4/140/36.2 COVID pending   Assessment/Plan Principal Problem:   Acute respiratory failure with hypercapnia (HCC) Active Problems:   Chronic pain syndrome   Bilious emesis  Acute respiratory failure with hypercapnia -Patient presenting with emesis x 2, AMS, hypoxia to the  80s -He was placed on BIPAP after VBG showed pCO2 76.5 -Repeat VBG was worse -He did appear to respond to Narcan x 2 and was recently prescribed Suboxone - ?CO2 narcosis from oversedation (also has recent - since Jan 24 - prescriptions for Ativan, Oxy - and it is not clear what else he is receiving at his facility) -Most likely etiology currently appears to be oversedation and so Narcan drip and repeat VBG ordered -Negative CXR but aspiration PNA is also a consideration; he received 1 dose of Levaquin in the ER -GI etiology is also a consideration given reported emesis -Upon recheck, the patient had significant bilious emesis while on BIPAP and appears unable to protect his airway at this time -Given his daughter's request to make the patient full code, PCCM is proceeding with intubation at this time and will accept the patient to their service for further evaluation  Chronic pain -Patient with known severe spinal stenosis, s/p 5 prior back surgeries -He received epidural steroid injection of the lumbar spine by IR on 1/6 with minimal improvement -He has had chronic pain for a long period of time and was previously living independently but was placed in SNF on 2/18 after ER visit for FTT (had also been in SNF after hospitalization from 12/22-1/8) -I have reviewed this patient in the [place] Controlled Substances Reporting System.  He is receiving medications from multiple providers. -He is at particularly high risk of opioid misuse, diversion, or overdose. -Most recently, he was given Suboxone on 3/22  -He also has been taking multiple adjuvant medications including gabapentin, duloxetine, tramadol, hydroxychloroquine, and Robaxin  Emesis -Patient with prior issues with n/v/d and abdominal pain during his December hospitalization; etiology was thought to be related to CAP and Enterococcus UTI as well as acute gastroenteritis -Reported to have had 2 episodes of emesis prior to today's  presentation -Mild generalized abdominal TTP on exam -He appears likely to benefit from abdominal CT once his airway is stabilized  *Chronic medical issues were not assessed given patient's clinical deterioration/instability while in the ER.  Care has been assumed by PCCM at this time.   Total critical care time: 65 minutes Critical care time was exclusive of separately billable procedures and treating other patients. Critical care was necessary to treat or prevent imminent or life-threatening deterioration. Critical care was time spent personally by me on the following activities: development of treatment plan with patient and/or surrogate as well as nursing, discussions with consultants, evaluation of patient's response to treatment, examination of patient, obtaining history from patient or surrogate, ordering and performing treatments and interventions, ordering and review of laboratory studies, ordering and review of radiographic studies, pulse oximetry and re-evaluation of patient's condition.     Karmen Bongo MD [facility]   How to contact the [facility] Attending or Consulting provider [place] or covering provider during after hours [place], for this patient?  1. Check the care team in [facility] and look for a) attending/consulting [facility] provider listed and b) the [facility] team listed 2. Log into [email or web address] and use [place]'s universal password to access. If you do not have the password, please contact the hospital operator. 3. Locate the [facility] provider you are looking for under [facility] and page to a number that you can be directly reached. 4. If you still have difficulty reaching the provider, please page the [facility] (Director on Call) for  the Hospitalists listed on amion for assistance.   02/14/2021, 9:06 AM

## 2021-02-14 NOTE — ED Notes (Signed)
Pt vomited with bipap mask on. Bipap removed. Pt suctioned. MD aware.

## 2021-02-14 NOTE — Progress Notes (Addendum)
PCCM:  I called and updated daughter and son via phone.   Recently seen by pain clinic this week. And he received a new rx for 8mg  TID of suboxone per the family.   Garner Nash, DO [facility] 02/14/2021 9:46 AM

## 2021-02-14 NOTE — H&P (Signed)
NAME:  Jorge Sherman, MRN:  [id number], DOB:  Aug 13, 1941, LOS: 0 ADMISSION DATE:  02/13/2021, CONSULTATION DATE: 02/14/2021 REFERRING MD: Dr. Lorin Mercy, CHIEF COMPLAINT: Hypercarbic respiratory failure, vomiting  History of Present Illness:  This is an 80 year old gentleman with a past medical history of hypertension, diabetes, chronic pain, depression.  Lives at a facility.  He was found this morning to have O2 sats in the 80s he was transferred to the emergency department.  He was given Narcan x2 he responded briefly.  He has been a DNR in the past however discussions with family this morning have wanted to reverse this CODE STATUS for him to do everything that we could to possibly keep him alive.  Patient was placed on a Narcan drip.  He started vomiting while on BAP BiPAP.  There is definite concern for aspiration.  Upon presentation to the room the patient was covered in lime green and bilious vomitus across the mouth beard and chest.  O2 sats were stable in the high 90s however patient was somnolent.  He will open eyes to voice.  He was found to be hypercarbic with an elevated PCO2.  At this point vomiting unable to continue BiPAP therapy decision was made for endotracheal intubation.  Please see separate procedure note.  Pulmonary critical care was consulted for recommendations and management and admission to the intensive care  Pertinent  Medical History   Past Medical History:  Diagnosis Date  . Chronic pain   . Depressive disorder, not elsewhere classified   . Dizziness and giddiness   . Essential and other specified forms of tremor   . Headache(784.0)   . Leukocytosis, unspecified   . Obstructive sleep apnea (adult) (pediatric)   . Pain in joint, lower leg   . Septic joint of left knee joint ([place]) 04/22/2019  . Spinal stenosis, lumbar region, without neurogenic claudication   . Thoracic or lumbosacral neuritis or radiculitis, unspecified   . Type II or unspecified type diabetes  mellitus without mention of complication, not stated as uncontrolled   . Unspecified cardiovascular disease   . Unspecified essential hypertension      Significant Hospital Events: Including procedures, antibiotic start and stop dates in addition to other pertinent events   . 02/14/2021: Endotracheal tube, central venous catheter, ICU admission  Interim History / Subjective:  Per HPI above patient to somnolent to obtain any history from.  Objective   Blood pressure (!) 157/109, pulse 96, temperature 98.7 F (37.1 C), temperature source Oral, resp. rate 20, SpO2 99 %.    Vent Mode: BIPAP;PCV FiO2 (%):  [30 %-40 %] 30 % Set Rate:  [12 bmp-18 bmp] 18 bmp PEEP:  [5 cmH20-6 cmH20] 6 cmH20  No intake or output data in the 24 hours ending 02/14/21 0915 There were no vitals filed for this visit.  Examination: General: Elderly, chronically ill-appearing gentleman vomitus across mouth beard and chest HENT: NCAT, no teeth Lungs: Bilateral rhonchi Cardiovascular: Regular rate rhythm S1-S2 Abdomen: Obese, soft nontender nondistended Extremities: Dependent edema Neuro: Alert to voice, opens eyes spontaneously will not follow commands GU: Deferred  Labs/imaging that I havepersonally reviewed  (right click and "Reselect all SmartList Selections" daily)   VBG: 7.2 8/76/148/36  Sodium 128, BUN 22, serum creatinine 0.9, hemoglobin 12, glucose 125  Resolved Hospital Problem list     Assessment & Plan:   Acute hypoxemic hypercapnic respiratory failure Aspiration pneumonia, aspiration pneumonitis Bilious vomiting, likely secondary to chronic pain, chronic opiate use status post Narcan  Plan: Holding patient's Suboxone, oxycodone, gabapentin at this time. We will need to restart this slowly as he high risk for withdrawal. He is on multiple other medications to include duloxetine, tramadol, Plaquenil and Robaxin. The controlled substance reporting system was reviewed by Dr. Lorin Mercy, please  see consult note. Adult mechanical vent support Low tidal volume ventilation Remains full code at this time. Chest x-ray repeat in a.m. Repeat labs CT abdomen and pelvis to rule out intra-abdominal pathology however I think vomiting is related to Narcan. pharmacy consultation for Unasyn dosing, aspiration PAD guidelines sedation with fentanyl infusion plus propofol  Best practice (right click and "Reselect all SmartList Selections" daily)  Diet:  NPO Pain/Anxiety/Delirium protocol (if indicated): Yes (RASS goal -1) VAP protocol (if indicated): Yes DVT prophylaxis: LMWH GI prophylaxis: PPI Glucose control:  SSI Yes Central venous access:  Yes, and it is still needed Arterial line:  N/A Foley:  N/A Mobility:  bed rest  PT consulted: N/A Last date of multidisciplinary goals of care discussion [I have called and updated family] Code Status:  full code Disposition: ICU  Labs   CBC: Recent Labs  Lab 02/14/21 0100 02/14/21 0133 02/14/21 0149 02/14/21 0610 02/14/21 0832  WBC 14.6*  --   --   --   --   NEUTROABS 11.9*  --   --   --   --   HGB 11.8* 12.6* 13.3 12.6* 12.2*  HCT 35.6* 37.0* 39.0 37.0* 36.0*  MCV 95.7  --   --   --   --   PLT 353  --   --   --   --     Basic Metabolic Panel: Recent Labs  Lab 02/14/21 0100 02/14/21 0133 02/14/21 0149 02/14/21 0610 02/14/21 0832  NA 128* 128* 127* 127* 127*  K 4.7 4.6 4.6 4.8 4.6  CL 91* 89*  --   --   --   CO2 28  --   --   --   --   GLUCOSE 125* 125*  --   --   --   BUN 19 22  --   --   --   CREATININE 0.89 0.90  --   --   --   CALCIUM 9.4  --   --   --   --    GFR: CrCl cannot be calculated (Unknown ideal weight.). Recent Labs  Lab 02/14/21 0100  WBC 14.6*    Liver Function Tests: Recent Labs  Lab 02/14/21 0100  AST 16  ALT 11  ALKPHOS 70  BILITOT 0.6  PROT 6.4*  ALBUMIN 3.7   No results for input(s): LIPASE, AMYLASE in the last 168 hours. No results for input(s): AMMONIA in the last 168  hours.  ABG    Component Value Date/Time   HCO3 36.2 (H) 02/14/2021 0832   TCO2 38 (H) 02/14/2021 0832   ACIDBASEDEF 1.5 08/28/2019 1606   O2SAT 99.0 02/14/2021 0832     Coagulation Profile: No results for input(s): INR, PROTIME in the last 168 hours.  Cardiac Enzymes: No results for input(s): CKTOTAL, CKMB, CKMBINDEX, TROPONINI in the last 168 hours.  HbA1C: Hgb A1c MFr Bld  Date/Time Value Ref Range Status  11/12/2020 05:43 AM 5.8 (H) 4.8 - 5.6 % Final    Comment:    (NOTE) Pre diabetes:          5.7%-6.4%  Diabetes:              >6.4%  Glycemic control for   <  7.0% adults with diabetes   10/29/2020 08:16 PM 5.7 (H) 4.8 - 5.6 % Final    Comment:    (NOTE) Pre diabetes:          5.7%-6.4%  Diabetes:              >6.4%  Glycemic control for   <7.0% adults with diabetes     CBG: No results for input(s): GLUCAP in the last 168 hours.  Review of Systems:   Unable to obtain due to critical illness    Past Medical History:  He,  has a past medical history of Chronic pain, Depressive disorder, not elsewhere classified, Dizziness and giddiness, Essential and other specified forms of tremor, Headache(784.0), Leukocytosis, unspecified, Obstructive sleep apnea (adult) (pediatric), Pain in joint, lower leg, Septic joint of left knee joint ([place]) (04/22/2019), Spinal stenosis, lumbar region, without neurogenic claudication, Thoracic or lumbosacral neuritis or radiculitis, unspecified, Type II or unspecified type diabetes mellitus without mention of complication, not stated as uncontrolled, Unspecified cardiovascular disease, and Unspecified essential hypertension.   Surgical History:   Past Surgical History:  Procedure Laterality Date  . BACK SURGERY    . back surgey     times 3  . IR INJECT/THERA/INC NEEDLE/CATH/PLC EPI/LUMB/SAC W/IMG  11/26/2020     Social History:   reports that he has quit smoking. He has never used smokeless tobacco. He reports that he does not drink  alcohol and does not use drugs.   Family History:  His family history includes Cancer in his father; Diabetes in his mother; Heart disease in his father; Stroke in his mother.   Allergies Allergies  Allergen Reactions  . Cefepime Rash    Other reaction(s): Other (See Comments), redness  . Iohexol Hives and Other (See Comments)    A few hives post CT Other reaction(s): hives, few, post CT  . Fentanyl Itching, Nausea And Vomiting and Other (See Comments)    Patient states that it happened over 30 yrs ago  . Iodine Rash    Other reaction(s): Hives/Skin Rash     Home Medications  Prior to Admission medications   Medication Sig Start Date End Date Taking? Authorizing Provider  acetaminophen (TYLENOL) 500 MG tablet Take 2 tablets (1,000 mg total) by mouth every 6 (six) hours as needed for headache, mild pain or fever. 11/28/20   Dwyane Dee, MD  albuterol (VENTOLIN HFA) 108 (90 Base) MCG/ACT inhaler Inhale 2 puffs into the lungs every 4 (four) hours as needed for wheezing or shortness of breath. 11/03/20   Pokhrel, Corrie Mckusick, MD  amLODipine (NORVASC) 10 MG tablet Take 1 tablet (10 mg total) by mouth daily. 11/29/20   Dwyane Dee, MD  atorvastatin (LIPITOR) 80 MG tablet Take 80 mg by mouth daily. 12/14/20   [provider]  cloNIDine (CATAPRES) 0.1 MG tablet Take 0.1 mg by mouth 2 (two) times daily.     [provider]  diclofenac Sodium (VOLTAREN) 1 % GEL Apply 2 g topically 4 (four) times daily as needed for pain. 01/02/20   [provider]  DULoxetine (CYMBALTA) 30 MG capsule Take 1 capsule (30 mg total) by mouth 3 (three) times daily. 11/28/20   Dwyane Dee, MD  ferrous sulfate 325 (65 FE) MG tablet Take 325 mg by mouth daily.     [provider]  gabapentin (NEURONTIN) 300 MG capsule Take 1 capsule (300 mg total) by mouth 3 (three) times daily. 11/28/20   Dwyane Dee, MD  hydroxychloroquine (PLAQUENIL) 200 MG  tablet Take 200 mg by mouth 2 (two) times  daily.    [provider]  lidocaine (LIDODERM) 5 % Place 1 patch onto the skin daily. Remove & Discard patch within 12 hours or as directed by MD. Right chest 11/03/20   Pokhrel, Corrie Mckusick, MD  linaclotide (LINZESS) 72 MCG capsule Take 72 mcg by mouth daily as needed (constipation).    [provider]  loperamide (IMODIUM A-D) 2 MG tablet Take 1 tablet (2 mg total) by mouth 4 (four) times daily as needed for diarrhea or loose stools. 07/04/20   Georgette Shell, MD  LORazepam (ATIVAN) 1 MG tablet Take 1 tablet (1 mg total) by mouth every 8 (eight) hours as needed for anxiety. 11/28/20   Dwyane Dee, MD  methocarbamol (ROBAXIN) 500 MG tablet Take 1 tablet (500 mg total) by mouth every 6 (six) hours as needed for muscle spasms. Patient taking differently: Take 500 mg by mouth 3 (three) times daily as needed for muscle spasms. 11/28/20   Dwyane Dee, MD  Multiple Vitamin (MULTIVITAMIN WITH MINERALS) TABS tablet Take 1 tablet by mouth daily. 11/29/20   Dwyane Dee, MD  ondansetron (ZOFRAN) 4 MG tablet Take 1 tablet (4 mg total) by mouth every 6 (six) hours as needed for nausea. 07/04/20   Georgette Shell, MD  oxyCODONE (OXY IR/ROXICODONE) 5 MG immediate release tablet Take 1 tablet (5 mg total) by mouth every 6 (six) hours as needed for severe pain. 11/28/20   Dwyane Dee, MD  pantoprazole (PROTONIX) 40 MG tablet Take 1 tablet (40 mg total) by mouth daily. 11/29/20   Dwyane Dee, MD  polyethylene glycol (MIRALAX / GLYCOLAX) 17 g packet Take 17 g by mouth daily as needed for mild constipation. 11/03/20   Pokhrel, Corrie Mckusick, MD  rosuvastatin (CRESTOR) 40 MG tablet Take 40 mg by mouth every evening.     [provider]  tamsulosin (FLOMAX) 0.4 MG CAPS capsule Take 0.4 mg by mouth daily.    [provider]  traMADol (ULTRAM) 50 MG tablet Take 50 mg by mouth every 6 (six) hours as needed for pain. 12/14/20   [provider]     This patient is critically ill  with multiple organ system failure; which, requires frequent high complexity decision making, assessment, support, evaluation, and titration of therapies. This was completed through the application of advanced monitoring technologies and extensive interpretation of multiple databases. During this encounter critical care time was devoted to patient care services described in this note for 45 minutes.  [facility] 02/14/2021 9:18 AM

## 2021-02-14 NOTE — ED Notes (Signed)
Daughter Dennie Moltz [phone] would like an update when possible

## 2021-02-14 NOTE — Progress Notes (Signed)
phelbotomy attempted to obtain blood cultures by 2 different phlebotomist with 6 attempts. Each time was unsuccessful. Sallye Ober NP notified and will attempt blood cultures again 3/28 0500. Order modified

## 2021-02-15 ENCOUNTER — Inpatient Hospital Stay (HOSPITAL_COMMUNITY): Payer: Medicare HMO

## 2021-02-15 DIAGNOSIS — J9602 Acute respiratory failure with hypercapnia: Secondary | ICD-10-CM | POA: Diagnosis not present

## 2021-02-15 DIAGNOSIS — Z9911 Dependence on respirator [ventilator] status: Secondary | ICD-10-CM

## 2021-02-15 DIAGNOSIS — J9601 Acute respiratory failure with hypoxia: Secondary | ICD-10-CM

## 2021-02-15 LAB — CBC
HCT: 33.6 % — ABNORMAL LOW (ref 39.0–52.0)
HCT: 31 % — ABNORMAL LOW (ref 39.0–52.0)
Hemoglobin: 10.9 g/dL — ABNORMAL LOW (ref 13.0–17.0)
Hemoglobin: 10.3 g/dL — ABNORMAL LOW (ref 13.0–17.0)
MCH: 31.1 pg (ref 26.0–34.0)
MCH: 31.4 pg (ref 26.0–34.0)
MCHC: 32.4 g/dL (ref 30.0–36.0)
MCHC: 33.2 g/dL (ref 30.0–36.0)
MCV: 95.7 fL (ref 80.0–100.0)
MCV: 94.5 fL (ref 80.0–100.0)
Platelets: 308 10*3/uL (ref 150–400)
Platelets: 296 10*3/uL (ref 150–400)
RBC: 3.51 MIL/uL — ABNORMAL LOW (ref 4.22–5.81)
RBC: 3.28 MIL/uL — ABNORMAL LOW (ref 4.22–5.81)
RDW: 13.6 % (ref 11.5–15.5)
RDW: 13.7 % (ref 11.5–15.5)
WBC: 13.5 10*3/uL — ABNORMAL HIGH (ref 4.0–10.5)
WBC: 11.8 10*3/uL — ABNORMAL HIGH (ref 4.0–10.5)
nRBC: 0 % (ref 0.0–0.2)
nRBC: 0 % (ref 0.0–0.2)

## 2021-02-15 LAB — POCT I-STAT 7, (LYTES, BLD GAS, ICA,H+H)
Acid-Base Excess: 7 mmol/L — ABNORMAL HIGH (ref 0.0–2.0)
Bicarbonate: 32.4 mmol/L — ABNORMAL HIGH (ref 20.0–28.0)
Calcium, Ion: 1.29 mmol/L (ref 1.15–1.40)
HCT: 30 % — ABNORMAL LOW (ref 39.0–52.0)
Hemoglobin: 10.2 g/dL — ABNORMAL LOW (ref 13.0–17.0)
O2 Saturation: 97 %
Potassium: 3.3 mmol/L — ABNORMAL LOW (ref 3.5–5.1)
Sodium: 135 mmol/L (ref 135–145)
TCO2: 34 mmol/L — ABNORMAL HIGH (ref 22–32)
pCO2 arterial: 48.2 mmHg — ABNORMAL HIGH (ref 32.0–48.0)
pH, Arterial: 7.435 (ref 7.350–7.450)
pO2, Arterial: 87 mmHg (ref 83.0–108.0)

## 2021-02-15 LAB — PHOSPHORUS: Phosphorus: 3 mg/dL (ref 2.5–4.6)

## 2021-02-15 LAB — BASIC METABOLIC PANEL
Anion gap: 7 (ref 5–15)
Anion gap: 8 (ref 5–15)
BUN: 14 mg/dL (ref 8–23)
BUN: 12 mg/dL (ref 8–23)
CO2: 30 mmol/L (ref 22–32)
CO2: 31 mmol/L (ref 22–32)
Calcium: 9.1 mg/dL (ref 8.9–10.3)
Calcium: 9.1 mg/dL (ref 8.9–10.3)
Chloride: 94 mmol/L — ABNORMAL LOW (ref 98–111)
Chloride: 94 mmol/L — ABNORMAL LOW (ref 98–111)
Creatinine, Ser: 0.88 mg/dL (ref 0.61–1.24)
Creatinine, Ser: 0.78 mg/dL (ref 0.61–1.24)
GFR, Estimated: >60 mL/min (ref >60)
GFR, Estimated: >60 mL/min (ref >60)
Glucose, Bld: 125 mg/dL — ABNORMAL HIGH (ref 70–99)
Glucose, Bld: 125 mg/dL — ABNORMAL HIGH (ref 70–99)
Potassium: 3.6 mmol/L (ref 3.5–5.1)
Potassium: 3.2 mmol/L — ABNORMAL LOW (ref 3.5–5.1)
Sodium: 131 mmol/L — ABNORMAL LOW (ref 135–145)
Sodium: 133 mmol/L — ABNORMAL LOW (ref 135–145)

## 2021-02-15 LAB — GLUCOSE, CAPILLARY
Glucose-Capillary: 118 mg/dL — ABNORMAL HIGH (ref 70–99)
Glucose-Capillary: 102 mg/dL — ABNORMAL HIGH (ref 70–99)
Glucose-Capillary: 110 mg/dL — ABNORMAL HIGH (ref 70–99)
Glucose-Capillary: 129 mg/dL — ABNORMAL HIGH (ref 70–99)
Glucose-Capillary: 135 mg/dL — ABNORMAL HIGH (ref 70–99)
Glucose-Capillary: 145 mg/dL — ABNORMAL HIGH (ref 70–99)

## 2021-02-15 LAB — MAGNESIUM: Magnesium: 1.9 mg/dL (ref 1.7–2.4)

## 2021-02-15 LAB — TRIGLYCERIDES: Triglycerides: 406 mg/dL — ABNORMAL HIGH (ref <150)

## 2021-02-15 MED ORDER — SODIUM CHLORIDE 0.9 % IV SOLN: INTRAVENOUS | Status: DC | PRN

## 2021-02-15 MED ORDER — POTASSIUM CHLORIDE 10 MEQ/50ML IV SOLN
10.0000 meq | INTRAVENOUS | Status: AC
  Administered 2021-02-15 (×4): 10 meq via INTRAVENOUS
  Filled 2021-02-15 (×4): qty 50

## 2021-02-15 MED ORDER — NOREPINEPHRINE 4 MG/250ML-% IV SOLN
0.0000 ug/min | INTRAVENOUS | Status: DC
  Administered 2021-02-15: 10 ug/min via INTRAVENOUS

## 2021-02-15 MED ORDER — SODIUM CHLORIDE 0.9 % IV SOLN: Freq: Once | INTRAVENOUS | Status: DC

## 2021-02-15 MED ORDER — DEXMEDETOMIDINE HCL IN NACL 400 MCG/100ML IV SOLN
0.0000 ug/kg/h | INTRAVENOUS | Status: AC
  Administered 2021-02-15: 1 ug/kg/h via INTRAVENOUS
  Administered 2021-02-15 (×2): 0.6 ug/kg/h via INTRAVENOUS
  Administered 2021-02-15 – 2021-02-16 (×2): 1.2 ug/kg/h via INTRAVENOUS
  Administered 2021-02-16: 0.6 ug/kg/h via INTRAVENOUS
  Administered 2021-02-16: 1.2 ug/kg/h via INTRAVENOUS
  Administered 2021-02-16 (×2): 0.7 ug/kg/h via INTRAVENOUS
  Administered 2021-02-16 (×2): 1.2 ug/kg/h via INTRAVENOUS
  Administered 2021-02-17: 0.6 ug/kg/h via INTRAVENOUS
  Administered 2021-02-17: 1 ug/kg/h via INTRAVENOUS
  Administered 2021-02-17: 0.7 ug/kg/h via INTRAVENOUS
  Administered 2021-02-17: 0.4 ug/kg/h via INTRAVENOUS
  Administered 2021-02-18 (×3): 1 ug/kg/h via INTRAVENOUS
  Filled 2021-02-15 (×15): qty 100
  Filled 2021-02-15: qty 200

## 2021-02-15 MED ORDER — PROSOURCE TF PO LIQD
45.0000 mL | Freq: Four times a day (QID) | ORAL | Status: DC
  Administered 2021-02-15 – 2021-02-18 (×12): 45 mL
  Filled 2021-02-15 (×13): qty 45

## 2021-02-15 MED ORDER — NOREPINEPHRINE 4 MG/250ML-% IV SOLN: 0.0000 ug/min | INTRAVENOUS | Status: DC

## 2021-02-15 MED ORDER — SODIUM CHLORIDE 0.9 % IV BOLUS
500.0000 mL | Freq: Once | INTRAVENOUS | Status: AC
  Administered 2021-02-15: 500 mL via INTRAVENOUS

## 2021-02-15 MED ORDER — ADULT MULTIVITAMIN W/MINERALS CH
1.0000 | ORAL_TABLET | Freq: Every day | ORAL | Status: DC
  Administered 2021-02-15 – 2021-02-19 (×5): 1
  Filled 2021-02-15 (×5): qty 1

## 2021-02-15 MED ORDER — VITAL AF 1.2 CAL PO LIQD
1000.0000 mL | ORAL | Status: DC
  Administered 2021-02-15 – 2021-02-16 (×2): 1000 mL
  Filled 2021-02-15: qty 1000

## 2021-02-15 MED ORDER — ENOXAPARIN SODIUM 60 MG/0.6ML ~~LOC~~ SOLN
55.0000 mg | SUBCUTANEOUS | Status: DC
  Administered 2021-02-16 – 2021-02-21 (×5): 55 mg via SUBCUTANEOUS
  Filled 2021-02-15 (×4): qty 0.55
  Filled 2021-02-15: qty 0.6
  Filled 2021-02-15: qty 0.55

## 2021-02-15 MED ORDER — SODIUM CHLORIDE 0.9 % IV BOLUS: 500.0000 mL | Freq: Once | INTRAVENOUS | Status: DC

## 2021-02-15 MED ORDER — SODIUM CHLORIDE 0.9 % IV SOLN: Freq: Once | INTRAVENOUS | Status: AC

## 2021-02-15 MED ORDER — POTASSIUM CHLORIDE 20 MEQ PO PACK
20.0000 meq | PACK | ORAL | Status: AC
  Administered 2021-02-15 (×2): 20 meq
  Filled 2021-02-15 (×2): qty 1

## 2021-02-15 MED ORDER — NOREPINEPHRINE 4 MG/250ML-% IV SOLN
INTRAVENOUS | Status: AC
  Filled 2021-02-15: qty 250

## 2021-02-15 NOTE — Progress Notes (Signed)
Initial Nutrition Assessment  DOCUMENTATION CODES:   Obesity unspecified  INTERVENTION:   Initiate tube feeding via OG tube: Vital AF 1.2 at 50 ml/h (1200 ml per day) Prosource TF 45 ml QID  Provides 1600 kcal, 134 gm protein, 973 ml free water daily.  MVI daily via tube.  NUTRITION DIAGNOSIS:   Inadequate oral intake related to inability to eat as evidenced by NPO status.  GOAL:   Provide needs based on ASPEN/SCCM guidelines  MONITOR:   Vent status,Labs,TF tolerance  REASON FOR ASSESSMENT:   Ventilator,Consult Enteral/tube feeding initiation and management  ASSESSMENT:   80 yo male admitted with hypercarbic respiratory failure. Failed BiPAP and required intubation. PMH includes HTN, DM, chronic pain, depression, spinal stenosis.  Discussed patient in ICU rounds and with RN today. Family reversed code status to full code on admission.  Patient vomited bile on admission; concern for aspiration.   Propofol was stopped due to elevated triglycerides.  RD to order TF per discussion with CCM. OG tube in place.  Patient is currently intubated on ventilator support MV: 7.2 L/min Temp (24hrs), Avg:99.1 F (37.3 C), Min:98.2 F (36.8 C), Max:100.4 F (38 C)   Labs reviewed. K 3.3 CBG: 102-110  Medications reviewed and include colace, miralax, KCl, precedex, fentanyl.  Weight history reviewed. No significant weight changes noted.   NUTRITION - FOCUSED PHYSICAL EXAM:  Flowsheet Row Most Recent Value  Orbital Region No depletion  Upper Arm Region No depletion  Thoracic and Lumbar Region No depletion  Buccal Region No depletion  Temple Region Mild depletion  Clavicle Bone Region No depletion  Clavicle and Acromion Bone Region No depletion  Scapular Bone Region No depletion  Dorsal Hand No depletion  Patellar Region Mild depletion  Anterior Thigh Region No depletion  Posterior Calf Region No depletion  Edema (RD Assessment) Mild  Hair Reviewed  Eyes Unable  to assess  Mouth Unable to assess  Skin Reviewed  Nails Reviewed       Diet Order:   Diet Order            Diet NPO time specified  Diet effective now                 EDUCATION NEEDS:   Not appropriate for education at this time  Skin:  Skin Assessment: Reviewed RN Assessment  Last BM:  no BM documented  Height:   Ht Readings from Last 1 Encounters:  02/14/21 5\' 6"  (1.676 m)    Weight:   Wt Readings from Last 1 Encounters:  02/15/21 110.4 kg    Ideal Body Weight:  64.5 kg  BMI:  Body mass index is 39.28 kg/m.  Estimated Nutritional Needs:   Kcal:  1550  Protein:  130 gm  Fluid:  >/= 2 L    Lucas Mallow, RD, LDN, CNSC Please refer to Amion for contact information.

## 2021-02-15 NOTE — Progress Notes (Signed)
[facility] ADULT ICU REPLACEMENT PROTOCOL   The patient does apply for the [facility] Adult ICU Electrolyte Replacment Protocol based on the criteria listed below:   1. Is GFR >/= 30 ml/min? Yes.    Patient's GFR today is >60 2. Is SCr </= 2? Yes.   Patient's SCr is 0.78 ml/kg/hr 3. Did SCr increase >/= 0.5 in 24 hours? No. 4. Abnormal electrolyte(s): K+3.2. Ordered repletion with: protocol 6. If a panic level lab has been reported, has the CCM MD in charge been notified? Yes.  .   Physician:  Dr. Nicki Guadalajara, [place] Grumbling 02/15/2021 5:21 AM

## 2021-02-15 NOTE — Progress Notes (Signed)
.   Jorge Sherman, MRN:  [id number], DOB:  08/06/1941, LOS: 1 ADMISSION DATE:  02/13/2021, CONSULTATION DATE:  02/14/2021 REFERRING MD:  Dr. Lorin Mercy CHIEF COMPLAINT:  Acute hypercarbic respiratory failure  History of Present Illness:  This is an 80 year old gentleman with a past medical history of hypertension, diabetes, chronic pain, depression.  Lives at a facility.  He was found this morning to have O2 sats in the 80s he was transferred to the emergency department.  He was given Narcan x2 he responded briefly.  He has been a DNR in the past however discussions with family this morning have wanted to reverse this CODE STATUS for him to do everything that we could to possibly keep him alive.  Patient was placed on a Narcan drip.  He started vomiting while on BAP BiPAP.  There is definite concern for aspiration.  Upon presentation to the room the patient was covered in lime green and bilious vomitus across the mouth beard and chest.  O2 sats were stable in the high 90s however patient was somnolent.  He will open eyes to voice.  He was found to be hypercarbic with an elevated PCO2.  At this point vomiting unable to continue BiPAP therapy decision was made for endotracheal intubation.  Please see separate procedure note.  Pulmonary critical care was consulted for recommendations and management and admission to the intensive care  Pertinent  Medical History   Diagnosis Date  . Chronic pain   . Depressive disorder, not elsewhere classified   . Dizziness and giddiness   . Essential and other specified forms of tremor   . Headache(784.0)   . Leukocytosis, unspecified   . Obstructive sleep apnea (adult) (pediatric)   . Pain in joint, lower leg   . Septic joint of left knee joint ([place]) 04/22/2019  . Spinal stenosis, lumbar region, without neurogenic claudication   . Thoracic or lumbosacral neuritis or radiculitis, unspecified   . Type II or unspecified type diabetes mellitus without  mention of complication, not stated as uncontrolled   . Unspecified cardiovascular disease   . Unspecified essential hypertension      Significant Hospital Events: Including procedures, antibiotic start and stop dates in addition to other pertinent events    02/14/2021: Endotracheal tube, central venous catheter, ICU admission .   Interim History / Subjective:  No acute events overnight.  Opens eyes but does not follow commands  Objective   Blood pressure 102/61, pulse 72, temperature 98.1 F (36.7 C), temperature source Axillary, resp. rate 14, height 5\' 6"  (1.676 m), weight 110.4 kg, SpO2 98 %.    Vent Mode: PRVC FiO2 (%):  [30 %-40 %] 40 % Set Rate:  [14 bmp] 14 bmp Vt Set:  [510 mL] 510 mL PEEP:  [5 cmH20] 5 cmH20 Plateau Pressure:  [14 cmH20-15 cmH20] 15 cmH20   Intake/Output Summary (Last 24 hours) at 02/15/2021 0703 Last data filed at 02/15/2021 5456 Gross per 24 hour  Intake 1089.74 ml  Output 2550 ml  Net -1460.26 ml   Filed Weights   02/14/21 0949 02/15/21 0332  Weight: 110 kg 110.4 kg    Examination: General: Elderly, chronically ill-appearing gentleman. NAD.  HENT: NCAT, no teeth.  Lungs: Bilateral rhonchi. No wheezing or rales.  Cardiovascular: RRR. No m/r/g.  Abdomen: Obese, soft nontender nondistended Extremities: Dependent edema Neuro: Alert to voice, opens eyes spontaneously. Does not follow commands  Labs/imaging that I havepersonally reviewed  (right click and "Reselect all SmartList Selections" daily)  ABG improved ph 7.4, pCO2 48, pO2 87, Hgb 10.3, WC 11.8 K+ 3.2, repleting CXR w/ stable mild bibasilar atelectasis.  Resolved Hospital Problem list     Assessment & Plan:  Acute hypoxemic hypercapnic respiratory failure Aspiration pneumonia, aspiration pneumonitis Likely secondary to polypharmacy in the context of chronic pain and depression. ABG shows improved pCO2 and pH. Patient continues to be somnolent and requiring full vent support.   Plan: --Continue full vent support --Daily SBT --Continue Unasyn for 5-7 days --PAD guidelines sedation with fentanyl and precedex --Wean off propofol --Tracheal aspirate w/ no growth --Continue to hold Suboxone, oxycodone, gabapentin at this time. --Restart home homes slowly as patient is high risk of withdrawal   Hypokalemia K+ dropped to 3.2 overnight. No other electrolyte abnormality --Repleting  --F/u AM BMP  Depression --On duloxetine at home. Can start home meds when stable.   OSA --Continue MV  Best practice (right click and "Reselect all SmartList Selections" daily)  Diet:  NPO Pain/Anxiety/Delirium protocol (if indicated): Yes (RASS goal -1) VAP protocol (if indicated): Yes DVT prophylaxis: LMWH GI prophylaxis: PPI Glucose control:  SSI Yes Central venous access:  Yes, and it is still needed Arterial line:  N/A Foley:  N/A Mobility:  bed rest  PT consulted: N/A Last date of multidisciplinary goals of care discussion [I have called and updated family] Code Status:  full code Disposition: ICU  Labs   CBC: Recent Labs  Lab 02/14/21 0100 02/14/21 0133 02/14/21 1013 02/14/21 1024 02/14/21 1602 02/15/21 0150 02/15/21 0325 02/15/21 0446  WBC 14.6*  --  14.5*  --   --  13.5* 11.8*  --   NEUTROABS 11.9*  --  12.1*  --   --   --   --   --   HGB 11.8*   < > 10.8* 10.2* 11.6* 10.9* 10.3* 10.2*  HCT 35.6*   < > 33.8* 30.0* 34.0* 33.6* 31.0* 30.0*  MCV 95.7  --  97.1  --   --  95.7 94.5  --   PLT 353  --  352  --   --  308 296  --    < > = values in this interval not displayed.    Basic Metabolic Panel: Recent Labs  Lab 02/14/21 0100 02/14/21 0133 02/14/21 0149 02/14/21 1013 02/14/21 1024 02/14/21 1602 02/15/21 0150 02/15/21 0325 02/15/21 0446  NA 128* 128*   < > 129* 127* 133* 131* 133* 135  K 4.7 4.6   < > 4.4 4.5 3.6 3.6 3.2* 3.3*  CL 91* 89*  --  93*  --   --  94* 94*  --   CO2 28  --   --  29  --   --  30 31  --   GLUCOSE 125* 125*  --   131*  --   --  125* 125*  --   BUN 19 22  --  15  --   --  14 12  --   CREATININE 0.89 0.90  --  0.69  --   --  0.88 0.78  --   CALCIUM 9.4  --   --  9.0  --   --  9.1 9.1  --   MG  --   --   --  1.9  --   --  1.9  --   --   PHOS  --   --   --  2.8  --   --  3.0  --   --    < > =  values in this interval not displayed.   GFR: Estimated Creatinine Clearance: 85.8 mL/min (by C-G formula based on SCr of 0.78 mg/dL). Recent Labs  Lab 02/14/21 0100 02/14/21 1013 02/14/21 2048 02/15/21 0150 02/15/21 0325  WBC 14.6* 14.5*  --  13.5* 11.8*  LATICACIDVEN  --  1.5 1.1  --   --     Liver Function Tests: Recent Labs  Lab 02/14/21 0100 02/14/21 1013  AST 16 16  ALT 11 9  ALKPHOS 70 60  BILITOT 0.6 0.7  PROT 6.4* 5.8*  ALBUMIN 3.7 3.2*   No results for input(s): LIPASE, AMYLASE in the last 168 hours. No results for input(s): AMMONIA in the last 168 hours.  ABG    Component Value Date/Time   PHART 7.435 02/15/2021 0446   PCO2ART 48.2 (H) 02/15/2021 0446   PO2ART 87 02/15/2021 0446   HCO3 32.4 (H) 02/15/2021 0446   TCO2 34 (H) 02/15/2021 0446   ACIDBASEDEF 1.5 08/28/2019 1606   O2SAT 97.0 02/15/2021 0446     Coagulation Profile: No results for input(s): INR, PROTIME in the last 168 hours.  Cardiac Enzymes: No results for input(s): CKTOTAL, CKMB, CKMBINDEX, TROPONINI in the last 168 hours.  HbA1C: Hgb A1c MFr Bld  Date/Time Value Ref Range Status  11/12/2020 05:43 AM 5.8 (H) 4.8 - 5.6 % Final    Comment:    (NOTE) Pre diabetes:          5.7%-6.4%  Diabetes:              >6.4%  Glycemic control for   <7.0% adults with diabetes   10/29/2020 08:16 PM 5.7 (H) 4.8 - 5.6 % Final    Comment:    (NOTE) Pre diabetes:          5.7%-6.4%  Diabetes:              >6.4%  Glycemic control for   <7.0% adults with diabetes     CBG: Recent Labs  Lab 02/14/21 1220 02/14/21 1608 02/14/21 1933 02/14/21 2320 02/15/21 0320  GLUCAP 103* 100* 111* 122* 118*    Past  Medical History:  He,  has a past medical history of Chronic pain, Depressive disorder, not elsewhere classified, Dizziness and giddiness, Essential and other specified forms of tremor, Headache(784.0), Leukocytosis, unspecified, Obstructive sleep apnea (adult) (pediatric), Pain in joint, lower leg, Septic joint of left knee joint ([place]) (04/22/2019), Spinal stenosis, lumbar region, without neurogenic claudication, Thoracic or lumbosacral neuritis or radiculitis, unspecified, Type II or unspecified type diabetes mellitus without mention of complication, not stated as uncontrolled, Unspecified cardiovascular disease, and Unspecified essential hypertension.   Surgical History:   Past Surgical History:  Procedure Laterality Date  . BACK SURGERY    . back surgey     times 3  . IR INJECT/THERA/INC NEEDLE/CATH/PLC EPI/LUMB/SAC W/IMG  11/26/2020     Social History:   reports that he has quit smoking. He has never used smokeless tobacco. He reports that he does not drink alcohol and does not use drugs.   Family History:  His family history includes Cancer in his father; Diabetes in his mother; Heart disease in his father; Stroke in his mother.   Allergies Allergies  Allergen Reactions  . Cefepime Rash    Other reaction(s): Other (See Comments), redness  . Iohexol Hives and Other (See Comments)    A few hives post CT Other reaction(s): hives, few, post CT  . Fentanyl Itching, Nausea And Vomiting and Other (See Comments)  Patient states that it happened over 30 yrs ago  . Iodine Rash    Other reaction(s): Hives/Skin Rash     Home Medications  Prior to Admission medications   Medication Sig Start Date End Date Taking? Authorizing Provider  acetaminophen (TYLENOL) 500 MG tablet Take 2 tablets (1,000 mg total) by mouth every 6 (six) hours as needed for headache, mild pain or fever. 11/28/20   Dwyane Dee, MD  albuterol (VENTOLIN HFA) 108 (90 Base) MCG/ACT inhaler Inhale 2 puffs into the lungs  every 4 (four) hours as needed for wheezing or shortness of breath. 11/03/20   Pokhrel, Corrie Mckusick, MD  amLODipine (NORVASC) 10 MG tablet Take 1 tablet (10 mg total) by mouth daily. 11/29/20   Dwyane Dee, MD  atorvastatin (LIPITOR) 80 MG tablet Take 80 mg by mouth daily. 12/14/20   [provider]  cloNIDine (CATAPRES) 0.1 MG tablet Take 0.1 mg by mouth 2 (two) times daily.     [provider]  diclofenac Sodium (VOLTAREN) 1 % GEL Apply 2 g topically 4 (four) times daily as needed for pain. 01/02/20   [provider]  DULoxetine (CYMBALTA) 30 MG capsule Take 1 capsule (30 mg total) by mouth 3 (three) times daily. 11/28/20   Dwyane Dee, MD  ferrous sulfate 325 (65 FE) MG tablet Take 325 mg by mouth daily.     [provider]  gabapentin (NEURONTIN) 300 MG capsule Take 1 capsule (300 mg total) by mouth 3 (three) times daily. 11/28/20   Dwyane Dee, MD  hydroxychloroquine (PLAQUENIL) 200 MG tablet Take 200 mg by mouth 2 (two) times daily.    [provider]  lidocaine (LIDODERM) 5 % Place 1 patch onto the skin daily. Remove & Discard patch within 12 hours or as directed by MD. Right chest 11/03/20   Pokhrel, Corrie Mckusick, MD  linaclotide (LINZESS) 72 MCG capsule Take 72 mcg by mouth daily as needed (constipation).    [provider]  loperamide (IMODIUM A-D) 2 MG tablet Take 1 tablet (2 mg total) by mouth 4 (four) times daily as needed for diarrhea or loose stools. 07/04/20   Georgette Shell, MD  LORazepam (ATIVAN) 1 MG tablet Take 1 tablet (1 mg total) by mouth every 8 (eight) hours as needed for anxiety. 11/28/20   Dwyane Dee, MD  methocarbamol (ROBAXIN) 500 MG tablet Take 1 tablet (500 mg total) by mouth every 6 (six) hours as needed for muscle spasms. Patient taking differently: Take 500 mg by mouth 3 (three) times daily as needed for muscle spasms. 11/28/20   Dwyane Dee, MD  Multiple Vitamin (MULTIVITAMIN WITH MINERALS) TABS tablet Take 1 tablet  by mouth daily. 11/29/20   Dwyane Dee, MD  ondansetron (ZOFRAN) 4 MG tablet Take 1 tablet (4 mg total) by mouth every 6 (six) hours as needed for nausea. 07/04/20   Georgette Shell, MD  oxyCODONE (OXY IR/ROXICODONE) 5 MG immediate release tablet Take 1 tablet (5 mg total) by mouth every 6 (six) hours as needed for severe pain. 11/28/20   Dwyane Dee, MD  pantoprazole (PROTONIX) 40 MG tablet Take 1 tablet (40 mg total) by mouth daily. 11/29/20   Dwyane Dee, MD  polyethylene glycol (MIRALAX / GLYCOLAX) 17 g packet Take 17 g by mouth daily as needed for mild constipation. 11/03/20   Pokhrel, Corrie Mckusick, MD  rosuvastatin (CRESTOR) 40 MG tablet Take 40 mg by mouth every evening.     [provider]  tamsulosin (FLOMAX) 0.4 MG CAPS capsule  Take 0.4 mg by mouth daily.    [provider]  traMADol (ULTRAM) 50 MG tablet Take 50 mg by mouth every 6 (six) hours as needed for pain. 12/14/20   [provider]     Critical care time: 68

## 2021-02-16 DIAGNOSIS — Z9911 Dependence on respirator [ventilator] status: Secondary | ICD-10-CM | POA: Diagnosis not present

## 2021-02-16 DIAGNOSIS — G8929 Other chronic pain: Secondary | ICD-10-CM

## 2021-02-16 DIAGNOSIS — J9601 Acute respiratory failure with hypoxia: Secondary | ICD-10-CM | POA: Diagnosis not present

## 2021-02-16 LAB — CBC
HCT: 28.9 % — ABNORMAL LOW (ref 39.0–52.0)
Hemoglobin: 9.2 g/dL — ABNORMAL LOW (ref 13.0–17.0)
MCH: 30.7 pg (ref 26.0–34.0)
MCHC: 31.8 g/dL (ref 30.0–36.0)
MCV: 96.3 fL (ref 80.0–100.0)
Platelets: 240 10*3/uL (ref 150–400)
RBC: 3 MIL/uL — ABNORMAL LOW (ref 4.22–5.81)
RDW: 13.5 % (ref 11.5–15.5)
WBC: 11.1 10*3/uL — ABNORMAL HIGH (ref 4.0–10.5)
nRBC: 0 % (ref 0.0–0.2)

## 2021-02-16 LAB — POCT I-STAT 7, (LYTES, BLD GAS, ICA,H+H)
Acid-Base Excess: 5 mmol/L — ABNORMAL HIGH (ref 0.0–2.0)
Bicarbonate: 29 mmol/L — ABNORMAL HIGH (ref 20.0–28.0)
Calcium, Ion: 1.28 mmol/L (ref 1.15–1.40)
HCT: 27 % — ABNORMAL LOW (ref 39.0–52.0)
Hemoglobin: 9.2 g/dL — ABNORMAL LOW (ref 13.0–17.0)
O2 Saturation: 98 %
Patient temperature: 97.7
Potassium: 3.6 mmol/L (ref 3.5–5.1)
Sodium: 135 mmol/L (ref 135–145)
TCO2: 30 mmol/L (ref 22–32)
pCO2 arterial: 40.9 mmHg (ref 32.0–48.0)
pH, Arterial: 7.457 — ABNORMAL HIGH (ref 7.350–7.450)
pO2, Arterial: 92 mmHg (ref 83.0–108.0)

## 2021-02-16 LAB — BASIC METABOLIC PANEL
Anion gap: 6 (ref 5–15)
BUN: 16 mg/dL (ref 8–23)
CO2: 29 mmol/L (ref 22–32)
Calcium: 8.7 mg/dL — ABNORMAL LOW (ref 8.9–10.3)
Chloride: 100 mmol/L (ref 98–111)
Creatinine, Ser: 0.59 mg/dL — ABNORMAL LOW (ref 0.61–1.24)
GFR, Estimated: >60 mL/min (ref >60)
Glucose, Bld: 150 mg/dL — ABNORMAL HIGH (ref 70–99)
Potassium: 3.8 mmol/L (ref 3.5–5.1)
Sodium: 135 mmol/L (ref 135–145)

## 2021-02-16 LAB — URINE CULTURE

## 2021-02-16 LAB — GLUCOSE, CAPILLARY
Glucose-Capillary: 127 mg/dL — ABNORMAL HIGH (ref 70–99)
Glucose-Capillary: 138 mg/dL — ABNORMAL HIGH (ref 70–99)
Glucose-Capillary: 143 mg/dL — ABNORMAL HIGH (ref 70–99)
Glucose-Capillary: 158 mg/dL — ABNORMAL HIGH (ref 70–99)
Glucose-Capillary: 159 mg/dL — ABNORMAL HIGH (ref 70–99)
Glucose-Capillary: 167 mg/dL — ABNORMAL HIGH (ref 70–99)

## 2021-02-16 LAB — MAGNESIUM: Magnesium: 1.8 mg/dL (ref 1.7–2.4)

## 2021-02-16 LAB — HEMOGLOBIN A1C
Hgb A1c MFr Bld: 5.4 % (ref 4.8–5.6)
Mean Plasma Glucose: 108.28 mg/dL

## 2021-02-16 MED ORDER — FUROSEMIDE 10 MG/ML IJ SOLN
40.0000 mg | Freq: Three times a day (TID) | INTRAMUSCULAR | Status: AC
  Administered 2021-02-16 (×2): 40 mg via INTRAVENOUS
  Filled 2021-02-16 (×2): qty 4

## 2021-02-16 MED ORDER — POLYETHYLENE GLYCOL 3350 17 G PO PACK
17.0000 g | PACK | Freq: Two times a day (BID) | ORAL | Status: DC
  Administered 2021-02-16 – 2021-02-19 (×4): 17 g
  Filled 2021-02-16 (×5): qty 1

## 2021-02-16 MED ORDER — INSULIN ASPART 100 UNIT/ML ~~LOC~~ SOLN
0.0000 [IU] | SUBCUTANEOUS | Status: DC
  Administered 2021-02-16: 3 [IU] via SUBCUTANEOUS
  Administered 2021-02-16: 2 [IU] via SUBCUTANEOUS
  Administered 2021-02-16: 3 [IU] via SUBCUTANEOUS
  Administered 2021-02-16 (×2): 2 [IU] via SUBCUTANEOUS
  Administered 2021-02-16: 3 [IU] via SUBCUTANEOUS
  Administered 2021-02-17 – 2021-02-18 (×5): 2 [IU] via SUBCUTANEOUS
  Administered 2021-02-18: 3 [IU] via SUBCUTANEOUS
  Administered 2021-02-18: 2 [IU] via SUBCUTANEOUS
  Administered 2021-02-18: 3 [IU] via SUBCUTANEOUS
  Administered 2021-02-18 (×2): 2 [IU] via SUBCUTANEOUS

## 2021-02-16 MED ORDER — SENNOSIDES 8.8 MG/5ML PO SYRP
5.0000 mL | ORAL_SOLUTION | Freq: Two times a day (BID) | ORAL | Status: DC
  Administered 2021-02-16 – 2021-02-18 (×4): 5 mL
  Filled 2021-02-16 (×5): qty 5

## 2021-02-16 MED ORDER — PANTOPRAZOLE SODIUM 40 MG PO PACK
40.0000 mg | PACK | Freq: Every day | ORAL | Status: DC
  Administered 2021-02-16 – 2021-02-18 (×3): 40 mg
  Filled 2021-02-16 (×4): qty 20

## 2021-02-16 NOTE — Progress Notes (Signed)
[facility] Progress Note Patient Name: Jorge Sherman DOB: 1941-05-26 MRN: [id number]   Date of Service  02/16/2021  HPI/Events of Note  Hyperglycemia - Blood glucose = 145 --> 143. BMI = 39.   eICU Interventions  Plan: 1. Q 4 hour moderate Novolog SSI.     Intervention Category Major Interventions: Hyperglycemia - active titration of insulin therapy  Lysle Dingwall 02/16/2021, 3:42 AM

## 2021-02-16 NOTE — Plan of Care (Signed)
Daughter [name] updated via phone.  Julian Hy, DO 02/16/21 3:52 PM [facility]

## 2021-02-16 NOTE — Progress Notes (Signed)
.   NAME:  Jorge Sherman, MRN:  [id number], DOB:  10-19-1941, LOS: 2 ADMISSION DATE:  02/13/2021, CONSULTATION DATE:  02/14/2021 REFERRING MD:  Dr. Lorin Mercy CHIEF COMPLAINT:  Acute hypercarbic respiratory failure  History of Present Illness:  This is an 80 year old gentleman with a past medical history of hypertension, diabetes, chronic pain, depression.  Lives at a facility.  He was found this morning to have O2 sats in the 80s he was transferred to the emergency department.  He was given Narcan x2 he responded briefly.  He has been a DNR in the past however discussions with family this morning have wanted to reverse this CODE STATUS for him to do everything that we could to possibly keep him alive.  Patient was placed on a Narcan drip.  He started vomiting while on BAP BiPAP.  There is definite concern for aspiration.  Upon presentation to the room the patient was covered in lime green and bilious vomitus across the mouth beard and chest.  O2 sats were stable in the high 90s however patient was somnolent.  He will open eyes to voice.  He was found to be hypercarbic with an elevated PCO2.  At this point vomiting unable to continue BiPAP therapy decision was made for endotracheal intubation.  Please see separate procedure note.  Pulmonary critical care was consulted for recommendations and management and admission to the intensive care  Pertinent  Medical History   Diagnosis Date  . Chronic pain   . Depressive disorder, not elsewhere classified   . Dizziness and giddiness   . Essential and other specified forms of tremor   . Headache(784.0)   . Leukocytosis, unspecified   . Obstructive sleep apnea (adult) (pediatric)   . Pain in joint, lower leg   . Septic joint of left knee joint ([place]) 04/22/2019  . Spinal stenosis, lumbar region, without neurogenic claudication   . Thoracic or lumbosacral neuritis or radiculitis, unspecified   . Type II or unspecified type diabetes mellitus without  mention of complication, not stated as uncontrolled   . Unspecified cardiovascular disease   . Unspecified essential hypertension      Significant Hospital Events: Including procedures, antibiotic start and stop dates in addition to other pertinent events    02/14/2021: Endotracheal tube, central venous catheter, ICU admission .   Interim History / Subjective:  No acute events overnight Leukocytosis and hemoglobin stable Net I/O of +2 L over the last 48 hrs.   Objective   Blood pressure 138/67, pulse 65, temperature 97.7 F (36.5 C), temperature source Axillary, resp. rate 18, height 5\' 6"  (1.676 m), weight 105.2 kg, SpO2 100 %.    Vent Mode: PSV;CPAP FiO2 (%):  [30 %-40 %] 30 % Set Rate:  [14 bmp] 14 bmp Vt Set:  [510 mL] 510 mL PEEP:  [5 cmH20] 5 cmH20 Pressure Support:  [5 cmH20] 5 cmH20 Plateau Pressure:  [14 cmH20-16 cmH20] 16 cmH20   Intake/Output Summary (Last 24 hours) at 02/16/2021 7681 Last data filed at 02/16/2021 0600 Gross per 24 hour  Intake 2800.88 ml  Output 712 ml  Net 2088.88 ml   Filed Weights   02/14/21 0949 02/15/21 0332 02/16/21 0354  Weight: 110 kg 110.4 kg 105.2 kg    Examination: General: Elderly, chronically ill man laying in bed on vent. NAD.  HENT: Jorge Sherman/AT, no teeth.  Lungs: Bilateral rhonci Cardiovascular: RRR. No m/r/g.  Abdomen: Obese, soft. NT/ND. Normal BS Extremities: Trace dependent edema Neuro: Occassionally opens eyes. Does not  follow commands.  Labs/imaging that I havepersonally reviewed  (right click and "Reselect all SmartList Selections" daily)  ABG now slighly alkalotic ph 7.45, pCO2 40.9, pO2 92, Hgb 9.2, WC 11.1 K+ improved to 3.8.   Resolved Hospital Problem list     Assessment & Plan:  Acute hypoxemic hypercapnic respiratory failure Aspiration pneumonia, aspiration pneumonitis Likely secondary to polypharmacy in the context of chronic pain and depression. Repeat ABG shows more alkalotic picture. Patient still  somnolent and failing SBTs due to agitation and respiratory distress. --Continue full vent support, consider weaning when more awake --Daily SBT --Continue Unasyn (day 3 of 5) --Continue fentanyl and precedex, wean as tolerated --Continue PRN versed --Tracheal aspirate w/ no growth --Continue to hold Suboxone, oxycodone, gabapentin at this time. --Restart home homes slowly as patient is high risk of withdrawal   Shock, resolved BP improved and now more hypertensive with sBP in the 110s to 150s. Will continue to monitor closely and restart home meds when more stable.  --Off pressors --Bcx NGTD --Urine was contaminated --Continue Unasyn as above  Acute hyperglycemia A1C 5.4. Blood sugar range of 110-145 in the last 24 hrs. Slightly elevated due to stress in the setting of patient's critical illness. --SSI, moderate --CBG monitoring  Hypokalemia, improved K+ improved to 3.8 from 3.2 after repleting yesterday.  --Trend BMP --Monitor electrolytes  Chronic pain Depression On duloxetine, gabapentin, suboxone and oxy at home among other medications.  --Can start home meds when stable.   OSA --Continue MV  Goal of Care Patient was initially DNR but was switched to Full Code on admission. Plan to discuss further goals of care conversation with family today in the context of patient's risk of readmissions and poor quality of life.  --FULL CODE --Discussion with family, son and daughter about his wishes and goals   Best practice (right click and "Reselect all SmartList Selections" daily)  Diet:  NPO Pain/Anxiety/Delirium protocol (if indicated): Yes (RASS goal -1) VAP protocol (if indicated): Yes DVT prophylaxis: LMWH GI prophylaxis: PPI Glucose control:  SSI Yes Central venous access:  Yes, and it is still needed Arterial line:  N/A Foley:  N/A Mobility:  bed rest  PT consulted: N/A Last date of multidisciplinary goals of care discussion: Discussed case with daughter,  Jorge Sherman Code Status:  full code Disposition: ICU  Labs   CBC: Recent Labs  Lab 02/14/21 0100 02/14/21 0133 02/14/21 1013 02/14/21 1024 02/15/21 0150 02/15/21 0325 02/15/21 0446 02/16/21 0307 02/16/21 0819  WBC 14.6*  --  14.5*  --  13.5* 11.8*  --  11.1*  --   NEUTROABS 11.9*  --  12.1*  --   --   --   --   --   --   HGB 11.8*   < > 10.8*   < > 10.9* 10.3* 10.2* 9.2* 9.2*  HCT 35.6*   < > 33.8*   < > 33.6* 31.0* 30.0* 28.9* 27.0*  MCV 95.7  --  97.1  --  95.7 94.5  --  96.3  --   PLT 353  --  352  --  308 296  --  240  --    < > = values in this interval not displayed.    Basic Metabolic Panel: Recent Labs  Lab 02/14/21 0100 02/14/21 0133 02/14/21 0149 02/14/21 1013 02/14/21 1024 02/15/21 0150 02/15/21 0325 02/15/21 0446 02/16/21 0307 02/16/21 0819  NA 128* 128*   < > 129*   < > 131* 133* 135 135 135  K 4.7 4.6   < > 4.4   < > 3.6 3.2* 3.3* 3.8 3.6  CL 91* 89*  --  93*  --  94* 94*  --  100  --   CO2 28  --   --  29  --  30 31  --  29  --   GLUCOSE 125* 125*  --  131*  --  125* 125*  --  150*  --   BUN 19 22  --  15  --  14 12  --  16  --   CREATININE 0.89 0.90  --  0.69  --  0.88 0.78  --  0.59*  --   CALCIUM 9.4  --   --  9.0  --  9.1 9.1  --  8.7*  --   MG  --   --   --  1.9  --  1.9  --   --   --   --   PHOS  --   --   --  2.8  --  3.0  --   --   --   --    < > = values in this interval not displayed.   GFR: Estimated Creatinine Clearance: 83.8 mL/min (A) (by C-G formula based on SCr of 0.59 mg/dL (L)). Recent Labs  Lab 02/14/21 1013 02/14/21 2048 02/15/21 0150 02/15/21 0325 02/16/21 0307  WBC 14.5*  --  13.5* 11.8* 11.1*  LATICACIDVEN 1.5 1.1  --   --   --     Liver Function Tests: Recent Labs  Lab 02/14/21 0100 02/14/21 1013  AST 16 16  ALT 11 9  ALKPHOS 70 60  BILITOT 0.6 0.7  PROT 6.4* 5.8*  ALBUMIN 3.7 3.2*   No results for input(s): LIPASE, AMYLASE in the last 168 hours. No results for input(s): AMMONIA in the last 168  hours.  ABG    Component Value Date/Time   PHART 7.457 (H) 02/16/2021 0819   PCO2ART 40.9 02/16/2021 0819   PO2ART 92 02/16/2021 0819   HCO3 29.0 (H) 02/16/2021 0819   TCO2 30 02/16/2021 0819   ACIDBASEDEF 1.5 08/28/2019 1606   O2SAT 98.0 02/16/2021 0819     Coagulation Profile: No results for input(s): INR, PROTIME in the last 168 hours.  Cardiac Enzymes: No results for input(s): CKTOTAL, CKMB, CKMBINDEX, TROPONINI in the last 168 hours.  HbA1C: Hgb A1c MFr Bld  Date/Time Value Ref Range Status  02/16/2021 03:07 AM 5.4 4.8 - 5.6 % Final    Comment:    (NOTE) Pre diabetes:          5.7%-6.4%  Diabetes:              >6.4%  Glycemic control for   <7.0% adults with diabetes   11/12/2020 05:43 AM 5.8 (H) 4.8 - 5.6 % Final    Comment:    (NOTE) Pre diabetes:          5.7%-6.4%  Diabetes:              >6.4%  Glycemic control for   <7.0% adults with diabetes     CBG: Recent Labs  Lab 02/15/21 1542 02/15/21 1915 02/15/21 2317 02/16/21 0316 02/16/21 0734  GLUCAP 129* 135* 145* 143* 138*    Past Medical History:  He,  has a past medical history of Chronic pain, Depressive disorder, not elsewhere classified, Dizziness and giddiness, Essential and other specified forms of tremor, Headache(784.0), Leukocytosis, unspecified, Obstructive sleep apnea (  adult) (pediatric), Pain in joint, lower leg, Septic joint of left knee joint ([place]) (04/22/2019), Spinal stenosis, lumbar region, without neurogenic claudication, Thoracic or lumbosacral neuritis or radiculitis, unspecified, Type II or unspecified type diabetes mellitus without mention of complication, not stated as uncontrolled, Unspecified cardiovascular disease, and Unspecified essential hypertension.   Surgical History:   Past Surgical History:  Procedure Laterality Date  . BACK SURGERY    . back surgey     times 3  . IR INJECT/THERA/INC NEEDLE/CATH/PLC EPI/LUMB/SAC W/IMG  11/26/2020     Social History:   reports  that he has quit smoking. He has never used smokeless tobacco. He reports that he does not drink alcohol and does not use drugs.   Family History:  His family history includes Cancer in his father; Diabetes in his mother; Heart disease in his father; Stroke in his mother.   Allergies Allergies  Allergen Reactions  . Cefepime Rash    Redness  . Iohexol Hives and Other (See Comments)    A few hives post CT  . Fentanyl Itching, Nausea And Vomiting and Other (See Comments)    Patient states that it happened over 30 yrs ago  . Iodine Hives and Rash     Home Medications  Prior to Admission medications   Medication Sig Start Date End Date Taking? Authorizing Provider  acetaminophen (TYLENOL) 500 MG tablet Take 2 tablets (1,000 mg total) by mouth every 6 (six) hours as needed for headache, mild pain or fever. 11/28/20   Dwyane Dee, MD  albuterol (VENTOLIN HFA) 108 (90 Base) MCG/ACT inhaler Inhale 2 puffs into the lungs every 4 (four) hours as needed for wheezing or shortness of breath. 11/03/20   Pokhrel, Corrie Mckusick, MD  amLODipine (NORVASC) 10 MG tablet Take 1 tablet (10 mg total) by mouth daily. 11/29/20   Dwyane Dee, MD  atorvastatin (LIPITOR) 80 MG tablet Take 80 mg by mouth daily. 12/14/20   [provider]  cloNIDine (CATAPRES) 0.1 MG tablet Take 0.1 mg by mouth 2 (two) times daily.     [provider]  diclofenac Sodium (VOLTAREN) 1 % GEL Apply 2 g topically 4 (four) times daily as needed for pain. 01/02/20   [provider]  DULoxetine (CYMBALTA) 30 MG capsule Take 1 capsule (30 mg total) by mouth 3 (three) times daily. 11/28/20   Dwyane Dee, MD  ferrous sulfate 325 (65 FE) MG tablet Take 325 mg by mouth daily.     [provider]  gabapentin (NEURONTIN) 300 MG capsule Take 1 capsule (300 mg total) by mouth 3 (three) times daily. 11/28/20   Dwyane Dee, MD  hydroxychloroquine (PLAQUENIL) 200 MG tablet Take 200 mg by mouth 2 (two) times daily.     [provider]  lidocaine (LIDODERM) 5 % Place 1 patch onto the skin daily. Remove & Discard patch within 12 hours or as directed by MD. Right chest 11/03/20   Pokhrel, Corrie Mckusick, MD  linaclotide (LINZESS) 72 MCG capsule Take 72 mcg by mouth daily as needed (constipation).    [provider]  loperamide (IMODIUM A-D) 2 MG tablet Take 1 tablet (2 mg total) by mouth 4 (four) times daily as needed for diarrhea or loose stools. 07/04/20   Georgette Shell, MD  LORazepam (ATIVAN) 1 MG tablet Take 1 tablet (1 mg total) by mouth every 8 (eight) hours as needed for anxiety. 11/28/20   Dwyane Dee, MD  methocarbamol (ROBAXIN) 500 MG tablet Take 1 tablet (500 mg total) by  mouth every 6 (six) hours as needed for muscle spasms. Patient taking differently: Take 500 mg by mouth 3 (three) times daily as needed for muscle spasms. 11/28/20   Dwyane Dee, MD  Multiple Vitamin (MULTIVITAMIN WITH MINERALS) TABS tablet Take 1 tablet by mouth daily. 11/29/20   Dwyane Dee, MD  ondansetron (ZOFRAN) 4 MG tablet Take 1 tablet (4 mg total) by mouth every 6 (six) hours as needed for nausea. 07/04/20   Georgette Shell, MD  oxyCODONE (OXY IR/ROXICODONE) 5 MG immediate release tablet Take 1 tablet (5 mg total) by mouth every 6 (six) hours as needed for severe pain. 11/28/20   Dwyane Dee, MD  pantoprazole (PROTONIX) 40 MG tablet Take 1 tablet (40 mg total) by mouth daily. 11/29/20   Dwyane Dee, MD  polyethylene glycol (MIRALAX / GLYCOLAX) 17 g packet Take 17 g by mouth daily as needed for mild constipation. 11/03/20   Pokhrel, Corrie Mckusick, MD  rosuvastatin (CRESTOR) 40 MG tablet Take 40 mg by mouth every evening.     [provider]  tamsulosin (FLOMAX) 0.4 MG CAPS capsule Take 0.4 mg by mouth daily.    [provider]  traMADol (ULTRAM) 50 MG tablet Take 50 mg by mouth every 6 (six) hours as needed for pain. 12/14/20   [provider]     Critical care time: 78

## 2021-02-16 NOTE — Progress Notes (Signed)
Patient placed back on full vent support due to increased RR and agitation.

## 2021-02-17 DIAGNOSIS — R739 Hyperglycemia, unspecified: Secondary | ICD-10-CM

## 2021-02-17 DIAGNOSIS — J9602 Acute respiratory failure with hypercapnia: Secondary | ICD-10-CM | POA: Diagnosis not present

## 2021-02-17 DIAGNOSIS — J9601 Acute respiratory failure with hypoxia: Secondary | ICD-10-CM | POA: Diagnosis not present

## 2021-02-17 DIAGNOSIS — Z9911 Dependence on respirator [ventilator] status: Secondary | ICD-10-CM | POA: Diagnosis not present

## 2021-02-17 LAB — BASIC METABOLIC PANEL
Anion gap: 10 (ref 5–15)
BUN: 22 mg/dL (ref 8–23)
CO2: 30 mmol/L (ref 22–32)
Calcium: 8.8 mg/dL — ABNORMAL LOW (ref 8.9–10.3)
Chloride: 96 mmol/L — ABNORMAL LOW (ref 98–111)
Creatinine, Ser: 0.77 mg/dL (ref 0.61–1.24)
GFR, Estimated: >60 mL/min (ref >60)
Glucose, Bld: 137 mg/dL — ABNORMAL HIGH (ref 70–99)
Potassium: 3 mmol/L — ABNORMAL LOW (ref 3.5–5.1)
Sodium: 136 mmol/L (ref 135–145)

## 2021-02-17 LAB — GLUCOSE, CAPILLARY
Glucose-Capillary: 113 mg/dL — ABNORMAL HIGH (ref 70–99)
Glucose-Capillary: 133 mg/dL — ABNORMAL HIGH (ref 70–99)
Glucose-Capillary: 137 mg/dL — ABNORMAL HIGH (ref 70–99)
Glucose-Capillary: 144 mg/dL — ABNORMAL HIGH (ref 70–99)
Glucose-Capillary: 147 mg/dL — ABNORMAL HIGH (ref 70–99)
Glucose-Capillary: 159 mg/dL — ABNORMAL HIGH (ref 70–99)

## 2021-02-17 LAB — CBC
HCT: 30 % — ABNORMAL LOW (ref 39.0–52.0)
Hemoglobin: 9.5 g/dL — ABNORMAL LOW (ref 13.0–17.0)
MCH: 30.5 pg (ref 26.0–34.0)
MCHC: 31.7 g/dL (ref 30.0–36.0)
MCV: 96.5 fL (ref 80.0–100.0)
Platelets: 289 10*3/uL (ref 150–400)
RBC: 3.11 MIL/uL — ABNORMAL LOW (ref 4.22–5.81)
RDW: 13.9 % (ref 11.5–15.5)
WBC: 12.9 10*3/uL — ABNORMAL HIGH (ref 4.0–10.5)
nRBC: 0 % (ref 0.0–0.2)

## 2021-02-17 MED ORDER — POTASSIUM CHLORIDE 20 MEQ PO PACK
20.0000 meq | PACK | ORAL | Status: AC
  Administered 2021-02-17 – 2021-02-18 (×3): 20 meq
  Filled 2021-02-17 (×3): qty 1

## 2021-02-17 MED ORDER — POTASSIUM CHLORIDE 10 MEQ/50ML IV SOLN: 10.0000 meq | INTRAVENOUS | Status: DC

## 2021-02-17 MED ORDER — BISACODYL 10 MG RE SUPP
10.0000 mg | Freq: Once | RECTAL | Status: AC
  Administered 2021-02-17: 10 mg via RECTAL
  Filled 2021-02-17: qty 1

## 2021-02-17 MED ORDER — BISACODYL 10 MG RE SUPP: 10.0000 mg | Freq: Every day | RECTAL | Status: DC | PRN

## 2021-02-17 MED ORDER — POTASSIUM CHLORIDE 10 MEQ/100ML IV SOLN
10.0000 meq | INTRAVENOUS | Status: AC
  Administered 2021-02-17 (×4): 10 meq via INTRAVENOUS
  Filled 2021-02-17 (×4): qty 100

## 2021-02-17 MED ORDER — MAGNESIUM SULFATE 2 GM/50ML IV SOLN
2.0000 g | Freq: Once | INTRAVENOUS | Status: AC
  Administered 2021-02-17: 2 g via INTRAVENOUS
  Filled 2021-02-17: qty 50

## 2021-02-17 MED ORDER — NAPHAZOLINE-GLYCERIN 0.012-0.2 % OP SOLN
1.0000 [drp] | Freq: Four times a day (QID) | OPHTHALMIC | Status: DC | PRN
Start: 2021-02-17 — End: 2021-02-24
  Administered 2021-02-17: 2 [drp] via OPHTHALMIC
  Administered 2021-02-19 – 2021-02-20 (×2): 1 [drp] via OPHTHALMIC
  Administered 2021-02-23: 2 [drp] via OPHTHALMIC
  Filled 2021-02-17 (×2): qty 15

## 2021-02-17 MED ORDER — FUROSEMIDE 10 MG/ML IJ SOLN
40.0000 mg | Freq: Once | INTRAMUSCULAR | Status: AC
  Administered 2021-02-17: 40 mg via INTRAVENOUS
  Filled 2021-02-17: qty 4

## 2021-02-17 MED ORDER — POTASSIUM CHLORIDE 20 MEQ PO PACK
20.0000 meq | PACK | ORAL | Status: AC
  Administered 2021-02-17 (×2): 20 meq
  Filled 2021-02-17 (×2): qty 1

## 2021-02-17 NOTE — Progress Notes (Signed)
[facility] ADULT ICU REPLACEMENT PROTOCOL   The patient does apply for the [facility] Adult ICU Electrolyte Replacment Protocol based on the criteria listed below:   1. Is GFR >/= 30 ml/min? Yes.    Patient's GFR today is >60 2. Is SCr </= 2? Yes.   Patient's SCr is 0.77 ml/kg/hr 3. Did SCr increase >/= 0.5 in 24 hours? No. 4. Abnormal electrolyte(s):  K 3.0 5. Ordered repletion with: protocol 6. If a panic level lab has been reported, has the CCM MD in charge been notified? Yes.  .   Physician:  S. Rosie Fate R Shilynn Hoch 02/17/2021 5:34 AM

## 2021-02-17 NOTE — Progress Notes (Signed)
.   NAME:  Jorge Sherman, MRN:  [id number], DOB:  [birth year], LOS: 3 ADMISSION DATE:  02/13/2021, CONSULTATION DATE:  02/14/2021 REFERRING MD:  Dr. Lorin Mercy CHIEF COMPLAINT:  Acute hypercarbic respiratory failure  History of Present Illness:  This is an 80 year old gentleman with a past medical history of hypertension, diabetes, chronic pain, depression.  Lives at a facility.  He was found this morning to have O2 sats in the 80s he was transferred to the emergency department.  He was given Narcan x2 he responded briefly.  He has been a DNR in the past however discussions with family this morning have wanted to reverse this CODE STATUS for him to do everything that we could to possibly keep him alive.  Patient was placed on a Narcan drip.  He started vomiting while on BAP BiPAP.  There is definite concern for aspiration.  Upon presentation to the room the patient was covered in lime green and bilious vomitus across the mouth beard and chest.  O2 sats were stable in the high 90s however patient was somnolent.  He will open eyes to voice.  He was found to be hypercarbic with an elevated PCO2.  At this point vomiting unable to continue BiPAP therapy decision was made for endotracheal intubation.  Please see separate procedure note.  Pulmonary critical care was consulted for recommendations and management and admission to the intensive care  Pertinent  Medical History   Diagnosis Date  . Chronic pain   . Depressive disorder, not elsewhere classified   . Dizziness and giddiness   . Essential and other specified forms of tremor   . Headache(784.0)   . Leukocytosis, unspecified   . Obstructive sleep apnea (adult) (pediatric)   . Pain in joint, lower leg   . Septic joint of left knee joint ([place]) 04/22/2019  . Spinal stenosis, lumbar region, without neurogenic claudication   . Thoracic or lumbosacral neuritis or radiculitis, unspecified   . Type II or unspecified type diabetes mellitus without  mention of complication, not stated as uncontrolled   . Unspecified cardiovascular disease   . Unspecified essential hypertension      Significant Hospital Events: Including procedures, antibiotic start and stop dates in addition to other pertinent events    02/14/2021: Endotracheal tube, central venous catheter, ICU admission  Interim History / Subjective:  No acute events overnight Hypokalemia w/ K+ of 3 in the setting of diuresis, repleting UOP of 2.15 L overnight, Net of 400 cc since admission  Objective   Blood pressure 124/70, pulse 85, temperature 98.6 F (37 C), temperature source Axillary, resp. rate 15, height 5\' 6"  (1.676 m), weight 110.6 kg, SpO2 95 %.    Vent Mode: PRVC FiO2 (%):  [21 %] 21 % Set Rate:  [14 bmp] 14 bmp Vt Set:  [510 mL] 510 mL PEEP:  [5 cmH20] 5 cmH20 Pressure Support:  [8 cmH20] 8 cmH20 Plateau Pressure:  [13 cmH20-19 cmH20] 14 cmH20   Intake/Output Summary (Last 24 hours) at 02/17/2021 0655 Last data filed at 02/17/2021 0400 Gross per 24 hour  Intake 2155.01 ml  Output 2150 ml  Net 5.01 ml   Filed Weights   02/15/21 0332 02/16/21 0354 02/17/21 0342  Weight: 110.4 kg 105.2 kg 110.6 kg    Examination: General: Elderly, chronically ill man laying in bed on vent. NAD.  HENT: [place]/AT, no teeth. Pupil sluggish reaction to light.   Lungs: Bilateral rhonchi. No wheezing or rales.  Cardiovascular: RRR. No m/r/g. Trace BLE  edema Abdomen: Obese, soft. NT/ND. Normal BS Extremities: Trace dependent edema Neuro: Occassionally opens eyes to voice. Does not follow commands.  Labs/imaging that I havepersonally reviewed  (right click and "Reselect all SmartList Selections" daily)  BMP with a K+ of 3.0 CBC with WBC of 12.9 and hgb of 9.5.   Resolved Hospital Problem list     Assessment & Plan:  Acute hypoxemic hypercapnic respiratory failure Aspiration pneumonia, aspiration pneumonitis Likely secondary to polypharmacy in the context of chronic pain  and depression. Continues to require full vent support. Did not tolerate weaning due to high respiratory rate. Off sedation this morning. Continues to have intermittent agitations. Awake but does not follow commands.  --Continue full vent support, Continue daily SBTs --Continue Unasyn (day 4 of 5) --Continue precedex, wean as tolerated --Continue PRN versed --Tracheal aspirate w/ no growth --Continue to hold Suboxone, oxycodone, gabapentin at this time. --Restart home homes slowly as patient is high risk of withdrawal   Shock, resolved BP improved and now more normotensive with sBP in the 100s-130s overnight. Will continue to monitor closely and restart home meds when more stable.  --Off pressors --Bcx NGTD --Urine was contaminated --Continue Unasyn as above  Acute hyperglycemia A1C 5.4. Blood sugar range of 113-159 in the last 24 hrs. Slightly elevated due to stress in the setting of patient's critical illness. --SSI, moderate --CBG monitoring  Fluid overlook Hypokalemia Hypomagnesia.  K+ worsened overnight from 3.6 to 3.0 in the setting of lasix dose yesterday. Repleting with IV and Tube K+.  --IV Kcl 10 mEq x4 doses --KCl 20 mEq via tube x 2 doses --Lasix 40 mg x1 dose --KCl 20 mEq via tube x3 doses starting at 1600 --Trend BMP --Monitor electrolytes  Constipation Patient has not had any bowel movements during ICU stay. No improvement on current bowel regimen. --Continue MiraLax and Senokot BID --Continue colace 100 mg BID --Start Dulcolax suppository 10 mg rectal x1 dose  Chronic pain Depression On duloxetine, gabapentin, suboxone and oxy at home among other medications.  --Can start home meds when stable.   OSA --Continue MV  Goal of Care Patient was initially DNR but was switched to Full Code on admission. Plan to discuss further goals of care conversation with family today in the context of patient's risk of readmissions and poor quality of life.  --FULL  CODE --Discussion with family, son and daughter about his wishes and goals   Best practice (right click and "Reselect all SmartList Selections" daily)  Diet:  NPO Pain/Anxiety/Delirium protocol (if indicated): Yes (RASS goal -1) VAP protocol (if indicated): Yes DVT prophylaxis: LMWH GI prophylaxis: PPI Glucose control:  SSI Yes Central venous access:  Yes, and it is still needed Arterial line:  N/A Foley:  N/A Mobility:  bed rest  PT consulted: N/A Last date of multidisciplinary goals of care discussion: Discussed case with daughter, [name] Code Status:  full code Disposition: ICU  Labs   CBC: Recent Labs  Lab 02/14/21 0100 02/14/21 0133 02/14/21 1013 02/14/21 1024 02/15/21 0150 02/15/21 0325 02/15/21 0446 02/16/21 0307 02/16/21 0819 02/17/21 0400  WBC 14.6*  --  14.5*  --  13.5* 11.8*  --  11.1*  --  12.9*  NEUTROABS 11.9*  --  12.1*  --   --   --   --   --   --   --   HGB 11.8*   < > 10.8*   < > 10.9* 10.3* 10.2* 9.2* 9.2* 9.5*  HCT 35.6*   < >  33.8*   < > 33.6* 31.0* 30.0* 28.9* 27.0* 30.0*  MCV 95.7  --  97.1  --  95.7 94.5  --  96.3  --  96.5  PLT 353  --  352  --  308 296  --  240  --  289   < > = values in this interval not displayed.    Basic Metabolic Panel: Recent Labs  Lab 02/14/21 1013 02/14/21 1024 02/15/21 0150 02/15/21 0325 02/15/21 0446 02/16/21 0307 02/16/21 0819 02/16/21 0831 02/17/21 0400  NA 129*   < > 131* 133* 135 135 135  --  136  K 4.4   < > 3.6 3.2* 3.3* 3.8 3.6  --  3.0*  CL 93*  --  94* 94*  --  100  --   --  96*  CO2 29  --  30 31  --  29  --   --  30  GLUCOSE 131*  --  125* 125*  --  150*  --   --  137*  BUN 15  --  14 12  --  16  --   --  22  CREATININE 0.69  --  0.88 0.78  --  0.59*  --   --  0.77  CALCIUM 9.0  --  9.1 9.1  --  8.7*  --   --  8.8*  MG 1.9  --  1.9  --   --   --   --  1.8  --   PHOS 2.8  --  3.0  --   --   --   --   --   --    < > = values in this interval not displayed.   GFR: Estimated Creatinine  Clearance: 85.9 mL/min (by C-G formula based on SCr of 0.77 mg/dL). Recent Labs  Lab 02/14/21 1013 02/14/21 2048 02/15/21 0150 02/15/21 0325 02/16/21 0307 02/17/21 0400  WBC 14.5*  --  13.5* 11.8* 11.1* 12.9*  LATICACIDVEN 1.5 1.1  --   --   --   --     Liver Function Tests: Recent Labs  Lab 02/14/21 0100 02/14/21 1013  AST 16 16  ALT 11 9  ALKPHOS 70 60  BILITOT 0.6 0.7  PROT 6.4* 5.8*  ALBUMIN 3.7 3.2*   No results for input(s): LIPASE, AMYLASE in the last 168 hours. No results for input(s): AMMONIA in the last 168 hours.  ABG    Component Value Date/Time   PHART 7.457 (H) 02/16/2021 0819   PCO2ART 40.9 02/16/2021 0819   PO2ART 92 02/16/2021 0819   HCO3 29.0 (H) 02/16/2021 0819   TCO2 30 02/16/2021 0819   ACIDBASEDEF 1.5 08/28/2019 1606   O2SAT 98.0 02/16/2021 0819     Coagulation Profile: No results for input(s): INR, PROTIME in the last 168 hours.  Cardiac Enzymes: No results for input(s): CKTOTAL, CKMB, CKMBINDEX, TROPONINI in the last 168 hours.  HbA1C: Hgb A1c MFr Bld  Date/Time Value Ref Range Status  02/16/2021 03:07 AM 5.4 4.8 - 5.6 % Final    Comment:    (NOTE) Pre diabetes:          5.7%-6.4%  Diabetes:              >6.4%  Glycemic control for   <7.0% adults with diabetes   11/12/2020 05:43 AM 5.8 (H) 4.8 - 5.6 % Final    Comment:    (NOTE) Pre diabetes:  5.7%-6.4%  Diabetes:              >6.4%  Glycemic control for   <7.0% adults with diabetes     CBG: Recent Labs  Lab 02/16/21 1116 02/16/21 1559 02/16/21 1933 02/16/21 2333 02/17/21 0338  GLUCAP 127* 159* 158* 167* 113*    Past Medical History:  He,  has a past medical history of Chronic pain, Depressive disorder, not elsewhere classified, Dizziness and giddiness, Essential and other specified forms of tremor, Headache(784.0), Leukocytosis, unspecified, Obstructive sleep apnea (adult) (pediatric), Pain in joint, lower leg, Septic joint of left knee joint ([place])  (04/22/2019), Spinal stenosis, lumbar region, without neurogenic claudication, Thoracic or lumbosacral neuritis or radiculitis, unspecified, Type II or unspecified type diabetes mellitus without mention of complication, not stated as uncontrolled, Unspecified cardiovascular disease, and Unspecified essential hypertension.   Surgical History:   Past Surgical History:  Procedure Laterality Date  . BACK SURGERY    . back surgey     times 3  . IR INJECT/THERA/INC NEEDLE/CATH/PLC EPI/LUMB/SAC W/IMG  11/26/2020     Social History:   reports that he has quit smoking. He has never used smokeless tobacco. He reports that he does not drink alcohol and does not use drugs.   Family History:  His family history includes Cancer in his father; Diabetes in his mother; Heart disease in his father; Stroke in his mother.   Allergies Allergies  Allergen Reactions  . Cefepime Rash    Redness  . Iohexol Hives and Other (See Comments)    A few hives post CT  . Fentanyl Itching, Nausea And Vomiting and Other (See Comments)    Patient states that it happened over 30 yrs ago  . Iodine Hives and Rash     Home Medications  Prior to Admission medications   Medication Sig Start Date End Date Taking? Authorizing Provider  acetaminophen (TYLENOL) 500 MG tablet Take 2 tablets (1,000 mg total) by mouth every 6 (six) hours as needed for headache, mild pain or fever. 11/28/20   Dwyane Dee, MD  albuterol (VENTOLIN HFA) 108 (90 Base) MCG/ACT inhaler Inhale 2 puffs into the lungs every 4 (four) hours as needed for wheezing or shortness of breath. 11/03/20   Pokhrel, Corrie Mckusick, MD  amLODipine (NORVASC) 10 MG tablet Take 1 tablet (10 mg total) by mouth daily. 11/29/20   Dwyane Dee, MD  atorvastatin (LIPITOR) 80 MG tablet Take 80 mg by mouth daily. 12/14/20   [provider]  cloNIDine (CATAPRES) 0.1 MG tablet Take 0.1 mg by mouth 2 (two) times daily.     [provider]  diclofenac Sodium (VOLTAREN) 1 %  GEL Apply 2 g topically 4 (four) times daily as needed for pain. 01/02/20   [provider]  DULoxetine (CYMBALTA) 30 MG capsule Take 1 capsule (30 mg total) by mouth 3 (three) times daily. 11/28/20   Dwyane Dee, MD  ferrous sulfate 325 (65 FE) MG tablet Take 325 mg by mouth daily.     [provider]  gabapentin (NEURONTIN) 300 MG capsule Take 1 capsule (300 mg total) by mouth 3 (three) times daily. 11/28/20   Dwyane Dee, MD  hydroxychloroquine (PLAQUENIL) 200 MG tablet Take 200 mg by mouth 2 (two) times daily.    [provider]  lidocaine (LIDODERM) 5 % Place 1 patch onto the skin daily. Remove & Discard patch within 12 hours or as directed by MD. Right chest 11/03/20   Flora Lipps, MD  linaclotide [facility]) 72  MCG capsule Take 72 mcg by mouth daily as needed (constipation).    [provider]  loperamide (IMODIUM A-D) 2 MG tablet Take 1 tablet (2 mg total) by mouth 4 (four) times daily as needed for diarrhea or loose stools. 07/04/20   Georgette Shell, MD  LORazepam (ATIVAN) 1 MG tablet Take 1 tablet (1 mg total) by mouth every 8 (eight) hours as needed for anxiety. 11/28/20   Dwyane Dee, MD  methocarbamol (ROBAXIN) 500 MG tablet Take 1 tablet (500 mg total) by mouth every 6 (six) hours as needed for muscle spasms. Patient taking differently: Take 500 mg by mouth 3 (three) times daily as needed for muscle spasms. 11/28/20   Dwyane Dee, MD  Multiple Vitamin (MULTIVITAMIN WITH MINERALS) TABS tablet Take 1 tablet by mouth daily. 11/29/20   Dwyane Dee, MD  ondansetron (ZOFRAN) 4 MG tablet Take 1 tablet (4 mg total) by mouth every 6 (six) hours as needed for nausea. 07/04/20   Georgette Shell, MD  oxyCODONE (OXY IR/ROXICODONE) 5 MG immediate release tablet Take 1 tablet (5 mg total) by mouth every 6 (six) hours as needed for severe pain. 11/28/20   Dwyane Dee, MD  pantoprazole (PROTONIX) 40 MG tablet Take 1 tablet (40 mg total) by mouth daily.  11/29/20   Dwyane Dee, MD  polyethylene glycol (MIRALAX / GLYCOLAX) 17 g packet Take 17 g by mouth daily as needed for mild constipation. 11/03/20   Pokhrel, Corrie Mckusick, MD  rosuvastatin (CRESTOR) 40 MG tablet Take 40 mg by mouth every evening.     [provider]  tamsulosin (FLOMAX) 0.4 MG CAPS capsule Take 0.4 mg by mouth daily.    [provider]  traMADol (ULTRAM) 50 MG tablet Take 50 mg by mouth every 6 (six) hours as needed for pain. 12/14/20   [provider]     Critical care time: 55

## 2021-02-18 DIAGNOSIS — J9601 Acute respiratory failure with hypoxia: Secondary | ICD-10-CM | POA: Diagnosis not present

## 2021-02-18 DIAGNOSIS — Z9911 Dependence on respirator [ventilator] status: Secondary | ICD-10-CM | POA: Diagnosis not present

## 2021-02-18 DIAGNOSIS — J9602 Acute respiratory failure with hypercapnia: Secondary | ICD-10-CM | POA: Diagnosis not present

## 2021-02-18 LAB — BASIC METABOLIC PANEL
Anion gap: 8 (ref 5–15)
BUN: 26 mg/dL — ABNORMAL HIGH (ref 8–23)
CO2: 28 mmol/L (ref 22–32)
Calcium: 9 mg/dL (ref 8.9–10.3)
Chloride: 104 mmol/L (ref 98–111)
Creatinine, Ser: 0.69 mg/dL (ref 0.61–1.24)
GFR, Estimated: >60 mL/min (ref >60)
Glucose, Bld: 129 mg/dL — ABNORMAL HIGH (ref 70–99)
Potassium: 4.2 mmol/L (ref 3.5–5.1)
Sodium: 140 mmol/L (ref 135–145)

## 2021-02-18 LAB — CBC
HCT: 27.2 % — ABNORMAL LOW (ref 39.0–52.0)
Hemoglobin: 9 g/dL — ABNORMAL LOW (ref 13.0–17.0)
MCH: 31.7 pg (ref 26.0–34.0)
MCHC: 33.1 g/dL (ref 30.0–36.0)
MCV: 95.8 fL (ref 80.0–100.0)
Platelets: 310 10*3/uL (ref 150–400)
RBC: 2.84 MIL/uL — ABNORMAL LOW (ref 4.22–5.81)
RDW: 13.8 % (ref 11.5–15.5)
WBC: 12.5 10*3/uL — ABNORMAL HIGH (ref 4.0–10.5)
nRBC: 0 % (ref 0.0–0.2)

## 2021-02-18 LAB — GLUCOSE, CAPILLARY
Glucose-Capillary: 140 mg/dL — ABNORMAL HIGH (ref 70–99)
Glucose-Capillary: 144 mg/dL — ABNORMAL HIGH (ref 70–99)
Glucose-Capillary: 107 mg/dL — ABNORMAL HIGH (ref 70–99)
Glucose-Capillary: 140 mg/dL — ABNORMAL HIGH (ref 70–99)

## 2021-02-18 LAB — MAGNESIUM: Magnesium: 2 mg/dL (ref 1.7–2.4)

## 2021-02-18 MED ORDER — FUROSEMIDE 10 MG/ML IJ SOLN
40.0000 mg | Freq: Once | INTRAMUSCULAR | Status: AC
  Administered 2021-02-18: 40 mg via INTRAVENOUS
  Filled 2021-02-18: qty 4

## 2021-02-18 MED ORDER — POTASSIUM CHLORIDE 20 MEQ PO PACK
20.0000 meq | PACK | Freq: Once | ORAL | Status: DC
  Filled 2021-02-18: qty 1

## 2021-02-18 NOTE — Procedures (Signed)
Extubation Procedure Note  Patient Details:   Name: Jorge Sherman DOB: 04/11/1941 MRN: [id number]   Airway Documentation:    Vent end date: 02/18/21 Vent end time: 1210   Evaluation  O2 sats: stable throughout Complications: No apparent complications Patient did tolerate procedure well. Bilateral Breath Sounds: Rhonchi   Yes   Patient was extubated to a 2L [place] without any complications, dyspnea or stridor noted. Positive cuff leak prior to extubation.   Priya Matsen, Eddie [place] 02/18/2021, 12:10 PM

## 2021-02-18 NOTE — Progress Notes (Signed)
.   NAME:  Jorge Sherman, MRN:  [id number], DOB:  05/06/1941, LOS: 4 ADMISSION DATE:  02/13/2021, CONSULTATION DATE:  02/14/2021 REFERRING MD:  Dr. Lorin Mercy CHIEF COMPLAINT:  Acute hypercarbic respiratory failure  History of Present Illness:  This is an 80 year old gentleman with a past medical history of hypertension, diabetes, chronic pain, depression.  Lives at a facility.  He was found this morning to have O2 sats in the 80s he was transferred to the emergency department.  He was given Narcan x2 he responded briefly.  He has been a DNR in the past however discussions with family this morning have wanted to reverse this CODE STATUS for him to do everything that we could to possibly keep him alive.  Patient was placed on a Narcan drip.  He started vomiting while on BAP BiPAP.  There is definite concern for aspiration.  Upon presentation to the room the patient was covered in lime green and bilious vomitus across the mouth beard and chest.  O2 sats were stable in the high 90s however patient was somnolent.  He will open eyes to voice.  He was found to be hypercarbic with an elevated PCO2.  At this point vomiting unable to continue BiPAP therapy decision was made for endotracheal intubation.  Please see separate procedure note.  Pulmonary critical care was consulted for recommendations and management and admission to the intensive care  Pertinent  Medical History   Diagnosis Date  . Chronic pain   . Depressive disorder, not elsewhere classified   . Dizziness and giddiness   . Essential and other specified forms of tremor   . Headache(784.0)   . Leukocytosis, unspecified   . Obstructive sleep apnea (adult) (pediatric)   . Pain in joint, lower leg   . Septic joint of left knee joint ([place]) 04/22/2019  . Spinal stenosis, lumbar region, without neurogenic claudication   . Thoracic or lumbosacral neuritis or radiculitis, unspecified   . Type II or unspecified type diabetes mellitus without  mention of complication, not stated as uncontrolled   . Unspecified cardiovascular disease   . Unspecified essential hypertension      Significant Hospital Events: Including procedures, antibiotic start and stop dates in addition to other pertinent events    02/14/2021: Endotracheal tube, central venous catheter, ICU admission  02/18/2021: Extubated  Interim History / Subjective:  No acute events overnight Hypokalemia resolved. More awake and following commands Plan for extubation today  Objective   Blood pressure (!) 145/67, pulse 92, temperature 99 F (37.2 C), temperature source Axillary, resp. rate (!) 28, height 5\' 6"  (1.676 m), weight 110.6 kg, SpO2 97 %.    Vent Mode: PSV;CPAP FiO2 (%):  [21 %] 21 % Set Rate:  [14 bmp] 14 bmp Vt Set:  [510 mL] 510 mL PEEP:  [5 cmH20] 5 cmH20 Pressure Support:  [8 cmH20-10 cmH20] 8 cmH20 Plateau Pressure:  [12 cmH20-15 cmH20] 15 cmH20   Intake/Output Summary (Last 24 hours) at 02/18/2021 1200 Last data filed at 02/18/2021 0900 Gross per 24 hour  Intake 1935.88 ml  Output 700 ml  Net 1235.88 ml   Filed Weights   02/15/21 0332 02/16/21 0354 02/17/21 0342  Weight: 110.4 kg 105.2 kg 110.6 kg    Examination: General: Elderly, chronically ill man laying in bed off vent. NAD.  HENT: [place]/AT, no teeth. Pupil sluggish reaction to light.   Lungs: Bilateral rhonchi. No wheezing or rales.  Cardiovascular: RRR. No m/r/g. Trace BLE edema Abdomen: Obese, soft. NT/ND.  Normal BS Extremities: Trace dependent edema Neuro: Awake. Opens eyes. Follows simple commands. Moves all extremities.   Labs/imaging that I havepersonally reviewed  (right click and "Reselect all SmartList Selections" daily)  BMP with a K+ of 4.2 CBC with WBC of 12.5 and hgb of 9.0.   Resolved Hospital Problem list     Assessment & Plan:  Acute hypoxemic hypercapnic respiratory failure Aspiration pneumonia, aspiration pneumonitis Likely secondary to polypharmacy in the  context of chronic pain and depression. Continues to require full vent support. Did not tolerate weaning due to high respiratory rate. Off sedation this morning. Patient followed command to squeeze finger this morning. Extubated with RR in the mid 20s.  --Extubated this afternoon --Completed Unasyn course (day 5 of 5) --Will need SLP eval before resuming orals --Continue to hold Suboxone, oxycodone, gabapentin at this time. --Restart home homes slowly as patient is high risk of withdrawal   Shock, resolved BP improved and now more normotensive with sBP in the 100s-130s overnight. Will continue to monitor closely and restart home meds when more stable.  --Off pressors --Bcx NGTD --Urine was contaminated  Acute hyperglycemia A1C 5.4. Blood sugar range of 107-159 in the last 24 hrs. Slightly elevated due to stress in the setting of patient's critical illness. --SSI, moderate --CBG monitoring  Fluid overlook Hypokalemia, resolved Hypomagnesia, resolved K+ improved to 4.2 and Mag improved to 2.0. UOP of 1.1 L over the last 24hr w/ net of 1.5 L. Overall still volume up. Will repeat lasix dose. Continue to monitor closely.  --Lasix 40 mg x1 dose --KCl 20 mEq x1 dose --Trend BMP --Monitor electrolytes  Constipation Improved. Patient had multiple BMs yesterday after giving suppository.  --Continue MiraLax and Senokot BID --Continue colace 100 mg BID  Chronic pain Depression On duloxetine, gabapentin, suboxone and oxy at home among other medications.  --Can start home meds once patient pass swallow screen  OSA --Continue MV  Goal of Care Patient was initially DNR but was switched to Full Code on admission. Daughter wants pt to remain full code but expresses that pt "does not want to be a vegetable if his heart stops or has a stroke".  --Continue FULL CODE --Continue GOC discussions with family.   Best practice (right click and "Reselect all SmartList Selections" daily)  Diet:   NPO Pain/Anxiety/Delirium protocol (if indicated): Yes (RASS goal -1) VAP protocol (if indicated): Yes DVT prophylaxis: LMWH GI prophylaxis: PPI Glucose control:  SSI Yes Central venous access:  Yes, and it is still needed Arterial line:  N/A Foley:  N/A Mobility:  bed rest  PT consulted: N/A Last date of multidisciplinary goals of care discussion: Discussed case with daughter, [name] Code Status:  full code Disposition: ICU  Labs   CBC: Recent Labs  Lab 02/14/21 0100 02/14/21 0133 02/14/21 1013 02/14/21 1024 02/15/21 0150 02/15/21 0325 02/15/21 0446 02/16/21 0307 02/16/21 0819 02/17/21 0400 02/18/21 0237  WBC 14.6*  --  14.5*  --  13.5* 11.8*  --  11.1*  --  12.9* 12.5*  NEUTROABS 11.9*  --  12.1*  --   --   --   --   --   --   --   --   HGB 11.8*   < > 10.8*   < > 10.9* 10.3* 10.2* 9.2* 9.2* 9.5* 9.0*  HCT 35.6*   < > 33.8*   < > 33.6* 31.0* 30.0* 28.9* 27.0* 30.0* 27.2*  MCV 95.7  --  97.1  --  95.7 94.5  --  96.3  --  96.5 95.8  PLT 353  --  352  --  308 296  --  240  --  289 310   < > = values in this interval not displayed.    Basic Metabolic Panel: Recent Labs  Lab 02/14/21 1013 02/14/21 1024 02/15/21 0150 02/15/21 0325 02/15/21 0446 02/16/21 0307 02/16/21 0819 02/16/21 0831 02/17/21 0400 02/18/21 0237  NA 129*   < > 131* 133* 135 135 135  --  136 140  K 4.4   < > 3.6 3.2* 3.3* 3.8 3.6  --  3.0* 4.2  CL 93*  --  94* 94*  --  100  --   --  96* 104  CO2 29  --  30 31  --  29  --   --  30 28  GLUCOSE 131*  --  125* 125*  --  150*  --   --  137* 129*  BUN 15  --  14 12  --  16  --   --  22 26*  CREATININE 0.69  --  0.88 0.78  --  0.59*  --   --  0.77 0.69  CALCIUM 9.0  --  9.1 9.1  --  8.7*  --   --  8.8* 9.0  MG 1.9  --  1.9  --   --   --   --  1.8  --  2.0  PHOS 2.8  --  3.0  --   --   --   --   --   --   --    < > = values in this interval not displayed.   GFR: Estimated Creatinine Clearance: 85.9 mL/min (by C-G formula based on SCr of 0.69  mg/dL). Recent Labs  Lab 02/14/21 1013 02/14/21 2048 02/15/21 0150 02/15/21 0325 02/16/21 0307 02/17/21 0400 02/18/21 0237  WBC 14.5*  --    < > 11.8* 11.1* 12.9* 12.5*  LATICACIDVEN 1.5 1.1  --   --   --   --   --    < > = values in this interval not displayed.    Liver Function Tests: Recent Labs  Lab 02/14/21 0100 02/14/21 1013  AST 16 16  ALT 11 9  ALKPHOS 70 60  BILITOT 0.6 0.7  PROT 6.4* 5.8*  ALBUMIN 3.7 3.2*   No results for input(s): LIPASE, AMYLASE in the last 168 hours. No results for input(s): AMMONIA in the last 168 hours.  ABG    Component Value Date/Time   PHART 7.457 (H) 02/16/2021 0819   PCO2ART 40.9 02/16/2021 0819   PO2ART 92 02/16/2021 0819   HCO3 29.0 (H) 02/16/2021 0819   TCO2 30 02/16/2021 0819   ACIDBASEDEF 1.5 08/28/2019 1606   O2SAT 98.0 02/16/2021 0819     Coagulation Profile: No results for input(s): INR, PROTIME in the last 168 hours.  Cardiac Enzymes: No results for input(s): CKTOTAL, CKMB, CKMBINDEX, TROPONINI in the last 168 hours.  HbA1C: Hgb A1c MFr Bld  Date/Time Value Ref Range Status  02/16/2021 03:07 AM 5.4 4.8 - 5.6 % Final    Comment:    (NOTE) Pre diabetes:          5.7%-6.4%  Diabetes:              >6.4%  Glycemic control for   <7.0% adults with diabetes   11/12/2020 05:43 AM 5.8 (H) 4.8 - 5.6 % Final    Comment:    (NOTE) Pre  diabetes:          5.7%-6.4%  Diabetes:              >6.4%  Glycemic control for   <7.0% adults with diabetes     CBG: Recent Labs  Lab 02/17/21 1526 02/17/21 1944 02/17/21 2338 02/18/21 0342 02/18/21 0745  GLUCAP 147* 137* 159* 140* 144*    Past Medical History:  He,  has a past medical history of Chronic pain, Depressive disorder, not elsewhere classified, Dizziness and giddiness, Essential and other specified forms of tremor, Headache(784.0), Leukocytosis, unspecified, Obstructive sleep apnea (adult) (pediatric), Pain in joint, lower leg, Septic joint of left knee  joint ([place]) (04/22/2019), Spinal stenosis, lumbar region, without neurogenic claudication, Thoracic or lumbosacral neuritis or radiculitis, unspecified, Type II or unspecified type diabetes mellitus without mention of complication, not stated as uncontrolled, Unspecified cardiovascular disease, and Unspecified essential hypertension.   Surgical History:   Past Surgical History:  Procedure Laterality Date  . BACK SURGERY    . back surgey     times 3  . IR INJECT/THERA/INC NEEDLE/CATH/PLC EPI/LUMB/SAC W/IMG  11/26/2020     Social History:   reports that he has quit smoking. He has never used smokeless tobacco. He reports that he does not drink alcohol and does not use drugs.   Family History:  His family history includes Cancer in his father; Diabetes in his mother; Heart disease in his father; Stroke in his mother.   Allergies Allergies  Allergen Reactions  . Cefepime Rash    Redness  . Iohexol Hives and Other (See Comments)    A few hives post CT  . Fentanyl Itching, Nausea And Vomiting and Other (See Comments)    Patient states that it happened over 30 yrs ago  . Iodine Hives and Rash     Home Medications  Prior to Admission medications   Medication Sig Start Date End Date Taking? Authorizing Provider  acetaminophen (TYLENOL) 500 MG tablet Take 2 tablets (1,000 mg total) by mouth every 6 (six) hours as needed for headache, mild pain or fever. 11/28/20   Dwyane Dee, MD  albuterol (VENTOLIN HFA) 108 (90 Base) MCG/ACT inhaler Inhale 2 puffs into the lungs every 4 (four) hours as needed for wheezing or shortness of breath. 11/03/20   Pokhrel, Corrie Mckusick, MD  amLODipine (NORVASC) 10 MG tablet Take 1 tablet (10 mg total) by mouth daily. 11/29/20   Dwyane Dee, MD  atorvastatin (LIPITOR) 80 MG tablet Take 80 mg by mouth daily. 12/14/20   [provider]  cloNIDine (CATAPRES) 0.1 MG tablet Take 0.1 mg by mouth 2 (two) times daily.     [provider]  diclofenac Sodium  (VOLTAREN) 1 % GEL Apply 2 g topically 4 (four) times daily as needed for pain. 01/02/20   [provider]  DULoxetine (CYMBALTA) 30 MG capsule Take 1 capsule (30 mg total) by mouth 3 (three) times daily. 11/28/20   Dwyane Dee, MD  ferrous sulfate 325 (65 FE) MG tablet Take 325 mg by mouth daily.     [provider]  gabapentin (NEURONTIN) 300 MG capsule Take 1 capsule (300 mg total) by mouth 3 (three) times daily. 11/28/20   Dwyane Dee, MD  hydroxychloroquine (PLAQUENIL) 200 MG tablet Take 200 mg by mouth 2 (two) times daily.    [provider]  lidocaine (LIDODERM) 5 % Place 1 patch onto the skin daily. Remove & Discard patch within 12 hours or as directed by MD. Right chest  11/03/20   Pokhrel, Corrie Mckusick, MD  linaclotide (LINZESS) 72 MCG capsule Take 72 mcg by mouth daily as needed (constipation).    [provider]  loperamide (IMODIUM A-D) 2 MG tablet Take 1 tablet (2 mg total) by mouth 4 (four) times daily as needed for diarrhea or loose stools. 07/04/20   Georgette Shell, MD  LORazepam (ATIVAN) 1 MG tablet Take 1 tablet (1 mg total) by mouth every 8 (eight) hours as needed for anxiety. 11/28/20   Dwyane Dee, MD  methocarbamol (ROBAXIN) 500 MG tablet Take 1 tablet (500 mg total) by mouth every 6 (six) hours as needed for muscle spasms. Patient taking differently: Take 500 mg by mouth 3 (three) times daily as needed for muscle spasms. 11/28/20   Dwyane Dee, MD  Multiple Vitamin (MULTIVITAMIN WITH MINERALS) TABS tablet Take 1 tablet by mouth daily. 11/29/20   Dwyane Dee, MD  ondansetron (ZOFRAN) 4 MG tablet Take 1 tablet (4 mg total) by mouth every 6 (six) hours as needed for nausea. 07/04/20   Georgette Shell, MD  oxyCODONE (OXY IR/ROXICODONE) 5 MG immediate release tablet Take 1 tablet (5 mg total) by mouth every 6 (six) hours as needed for severe pain. 11/28/20   Dwyane Dee, MD  pantoprazole (PROTONIX) 40 MG tablet Take 1 tablet (40 mg total) by  mouth daily. 11/29/20   Dwyane Dee, MD  polyethylene glycol (MIRALAX / GLYCOLAX) 17 g packet Take 17 g by mouth daily as needed for mild constipation. 11/03/20   Pokhrel, Corrie Mckusick, MD  rosuvastatin (CRESTOR) 40 MG tablet Take 40 mg by mouth every evening.     [provider]  tamsulosin (FLOMAX) 0.4 MG CAPS capsule Take 0.4 mg by mouth daily.    [provider]  traMADol (ULTRAM) 50 MG tablet Take 50 mg by mouth every 6 (six) hours as needed for pain. 12/14/20   [provider]     Critical care time: 59

## 2021-02-18 NOTE — Plan of Care (Signed)
Extubated, doing well with RR in mid-20s. Coughing up secretions frequently. No stridor.  Julian Hy, DO 02/18/21 12:23 PM [facility]

## 2021-02-19 DIAGNOSIS — J9602 Acute respiratory failure with hypercapnia: Secondary | ICD-10-CM | POA: Diagnosis not present

## 2021-02-19 LAB — MAGNESIUM: Magnesium: 2.2 mg/dL (ref 1.7–2.4)

## 2021-02-19 LAB — BASIC METABOLIC PANEL
Anion gap: 10 (ref 5–15)
Anion gap: 9 (ref 5–15)
BUN: 23 mg/dL (ref 8–23)
BUN: 23 mg/dL (ref 8–23)
CO2: 30 mmol/L (ref 22–32)
CO2: 31 mmol/L (ref 22–32)
Calcium: 9.4 mg/dL (ref 8.9–10.3)
Calcium: 9.3 mg/dL (ref 8.9–10.3)
Chloride: 101 mmol/L (ref 98–111)
Chloride: 102 mmol/L (ref 98–111)
Creatinine, Ser: 0.76 mg/dL (ref 0.61–1.24)
Creatinine, Ser: 0.77 mg/dL (ref 0.61–1.24)
GFR, Estimated: >60 mL/min (ref >60)
GFR, Estimated: >60 mL/min (ref >60)
Glucose, Bld: 123 mg/dL — ABNORMAL HIGH (ref 70–99)
Glucose, Bld: 126 mg/dL — ABNORMAL HIGH (ref 70–99)
Potassium: 3.1 mmol/L — ABNORMAL LOW (ref 3.5–5.1)
Potassium: 3.4 mmol/L — ABNORMAL LOW (ref 3.5–5.1)
Sodium: 141 mmol/L (ref 135–145)
Sodium: 142 mmol/L (ref 135–145)

## 2021-02-19 LAB — CBC
HCT: 30.3 % — ABNORMAL LOW (ref 39.0–52.0)
Hemoglobin: 9.9 g/dL — ABNORMAL LOW (ref 13.0–17.0)
MCH: 30.7 pg (ref 26.0–34.0)
MCHC: 32.7 g/dL (ref 30.0–36.0)
MCV: 93.8 fL (ref 80.0–100.0)
Platelets: 356 10*3/uL (ref 150–400)
RBC: 3.23 MIL/uL — ABNORMAL LOW (ref 4.22–5.81)
RDW: 13.7 % (ref 11.5–15.5)
WBC: 12.4 10*3/uL — ABNORMAL HIGH (ref 4.0–10.5)
nRBC: 0 % (ref 0.0–0.2)

## 2021-02-19 LAB — GLUCOSE, CAPILLARY
Glucose-Capillary: 122 mg/dL — ABNORMAL HIGH (ref 70–99)
Glucose-Capillary: 130 mg/dL — ABNORMAL HIGH (ref 70–99)
Glucose-Capillary: 113 mg/dL — ABNORMAL HIGH (ref 70–99)
Glucose-Capillary: 112 mg/dL — ABNORMAL HIGH (ref 70–99)
Glucose-Capillary: 130 mg/dL — ABNORMAL HIGH (ref 70–99)
Glucose-Capillary: 136 mg/dL — ABNORMAL HIGH (ref 70–99)

## 2021-02-19 LAB — CULTURE, BLOOD (ROUTINE X 2): Culture: NO GROWTH

## 2021-02-19 MED ORDER — ACETAMINOPHEN 325 MG PO TABS
650.0000 mg | ORAL_TABLET | Freq: Four times a day (QID) | ORAL | Status: DC | PRN
  Administered 2021-02-19 – 2021-02-23 (×3): 650 mg via ORAL
  Filled 2021-02-19 (×4): qty 2

## 2021-02-19 MED ORDER — POLYETHYLENE GLYCOL 3350 17 G PO PACK
17.0000 g | PACK | Freq: Two times a day (BID) | ORAL | Status: DC
  Administered 2021-02-20 – 2021-02-24 (×7): 17 g via ORAL
  Filled 2021-02-19 (×9): qty 1

## 2021-02-19 MED ORDER — POTASSIUM CHLORIDE 10 MEQ/100ML IV SOLN
10.0000 meq | INTRAVENOUS | Status: AC
  Administered 2021-02-19 (×6): 10 meq via INTRAVENOUS
  Filled 2021-02-19 (×5): qty 100

## 2021-02-19 MED ORDER — PANTOPRAZOLE SODIUM 40 MG PO TBEC
40.0000 mg | DELAYED_RELEASE_TABLET | Freq: Every day | ORAL | Status: DC
  Administered 2021-02-19 – 2021-02-24 (×6): 40 mg via ORAL
  Filled 2021-02-19 (×5): qty 1

## 2021-02-19 MED ORDER — POTASSIUM CHLORIDE 10 MEQ/100ML IV SOLN: 10.0000 meq | INTRAVENOUS | Status: DC

## 2021-02-19 MED ORDER — POTASSIUM CHLORIDE 20 MEQ PO PACK: 20.0000 meq | PACK | ORAL | Status: DC

## 2021-02-19 MED ORDER — ADULT MULTIVITAMIN W/MINERALS CH
1.0000 | ORAL_TABLET | Freq: Every day | ORAL | Status: DC
  Administered 2021-02-20 – 2021-02-24 (×5): 1 via ORAL
  Filled 2021-02-19 (×5): qty 1

## 2021-02-19 MED ORDER — ENSURE MAX PROTEIN PO LIQD
11.0000 [oz_av] | Freq: Two times a day (BID) | ORAL | Status: DC
  Administered 2021-02-20 – 2021-02-24 (×8): 11 [oz_av] via ORAL
  Filled 2021-02-19 (×3): qty 330

## 2021-02-19 MED ORDER — AMLODIPINE BESYLATE 10 MG PO TABS
10.0000 mg | ORAL_TABLET | Freq: Once | ORAL | Status: AC
  Administered 2021-02-19: 10 mg via ORAL
  Filled 2021-02-19: qty 1

## 2021-02-19 MED ORDER — SENNOSIDES-DOCUSATE SODIUM 8.6-50 MG PO TABS
1.0000 | ORAL_TABLET | Freq: Two times a day (BID) | ORAL | Status: DC
  Administered 2021-02-19 – 2021-02-24 (×9): 1 via ORAL
  Filled 2021-02-19 (×10): qty 1

## 2021-02-19 MED ORDER — AMLODIPINE BESYLATE 10 MG PO TABS
10.0000 mg | ORAL_TABLET | Freq: Every day | ORAL | Status: DC
  Administered 2021-02-20 – 2021-02-24 (×5): 10 mg via ORAL
  Filled 2021-02-19 (×5): qty 1

## 2021-02-19 NOTE — Progress Notes (Signed)
.   NAMEQUINTAN Sherman, MRN:  [id number], DOB:  05-25-1941, LOS: 5 ADMISSION DATE:  02/13/2021, CONSULTATION DATE:  02/14/2021 REFERRING MD:  Dr. Lorin Mercy CHIEF COMPLAINT:  Acute hypercarbic respiratory failure  History of Present Illness:  This is an 80 year old gentleman with a past medical history of hypertension, diabetes, chronic pain, depression.  Lives at a facility.  He was found this morning to have O2 sats in the 80s he was transferred to the emergency department.  He was given Narcan x2 he responded briefly.  He has been a DNR in the past however discussions with family this morning have wanted to reverse this CODE STATUS for him to do everything that we could to possibly keep him alive.  Patient was placed on a Narcan drip.  He started vomiting while on BAP BiPAP.  There is definite concern for aspiration.  Upon presentation to the room the patient was covered in lime green and bilious vomitus across the mouth beard and chest.  O2 sats were stable in the high 90s however patient was somnolent.  He will open eyes to voice.  He was found to be hypercarbic with an elevated PCO2.  At this point vomiting unable to continue BiPAP therapy decision was made for endotracheal intubation.  Please see separate procedure note.  Pulmonary critical care was consulted for recommendations and management and admission to the intensive care  Pertinent  Medical History   Diagnosis Date  . Chronic pain   . Depressive disorder, not elsewhere classified   . Dizziness and giddiness   . Essential and other specified forms of tremor   . Headache(784.0)   . Leukocytosis, unspecified   . Obstructive sleep apnea (adult) (pediatric)   . Pain in joint, lower leg   . Septic joint of left knee joint ([place]) 04/22/2019  . Spinal stenosis, lumbar region, without neurogenic claudication   . Thoracic or lumbosacral neuritis or radiculitis, unspecified   . Type II or unspecified type diabetes mellitus without  mention of complication, not stated as uncontrolled   . Unspecified cardiovascular disease   . Unspecified essential hypertension      Significant Hospital Events: Including procedures, antibiotic start and stop dates in addition to other pertinent events    02/14/2021: Endotracheal tube, central venous catheter, ICU admission  02/18/2021: Extubated  02/19/2021: SLP eval pending.   Interim History / Subjective:   Asking when he can have a diet coke   NAEO   Voice is a little hoarse and garbed sounding but he is talkative and more alert today   Objective   Blood pressure (!) 153/74, pulse 78, temperature 98.2 F (36.8 C), temperature source Oral, resp. rate 13, height 5\' 6"  (1.676 m), weight 99.1 kg, SpO2 96 %.    Vent Mode: PSV;CPAP FiO2 (%):  [21 %-28 %] 28 % PEEP:  [5 cmH20] 5 cmH20 Pressure Support:  [8 cmH20] 8 cmH20   Intake/Output Summary (Last 24 hours) at 02/19/2021 0828 Last data filed at 02/19/2021 0615 Gross per 24 hour  Intake 510.88 ml  Output 2150 ml  Net -1639.12 ml   Filed Weights   02/16/21 0354 02/17/21 0342 02/19/21 0500  Weight: 105.2 kg 110.6 kg 99.1 kg    Examination: General: Chronically ill elderly M, reclined in bed NAD  HENT: NCAT. Head leaning to left. Increased muscle tone in neck. Anicteric sclera  Lungs: Symmetrical chest expansion, even and unlabored. Scattered rhonchi.  Cardiovascular: rrr s1s2 no rgm cap refill brisk  Abdomen: soft round ndnt + bowel sounds  Extremities:trace BLE edema. Non-pitting dependent hand edema. No acute deformity no cyanosis or clubbing  Neuro: AAOx2 following commands. PERRL Psych: pleasant, boisterous. Cooperative   Labs/imaging that I havepersonally reviewed  (right click and "Reselect all SmartList Selections" daily)  BMP with a K+ of 4.2 CBC with WBC of 12.5 and hgb of 9.0.   Resolved Hospital Problem list    Shock Acute hypoxic and hypercarbic respiratory failure requiring MV (2/2 polypharm,  hypoventilation) Constipation  Assessment & Plan:    Aspiration PNA, pneumonitis - s/p 5d unasyn Hx OSA  P -supplemental O2 as needed -? Noct CPAP   Possible dysphagia -his voice is a little garbled and hoarse, was extubated 3/31 P -SLP consult pending   Chronic pain -home Suboxone, oxycodone, gabapentin at this time. Depression  P -if complains of back pain would favor starting w lido patch, gabapentin (after passes swallow) -holding home meds. If he has sx opioid withdrawals can add low dose + sx mgmnt   -can restart home antidepressants if passes swallow -cont bowel reg   Hypokalemia -replace as needed  Hyperglycemia  -SSI  Volume overload, improving  -trend BMP, I/O  -hold lasix 4/1 and consider PRN  Goal of Care Patient was initially DNR but was switched to Full Code on admission. Daughter wants pt to remain full code but expresses that pt "does not want to be a vegetable if his heart stops or has a stroke".  -Full code    Best practice (right click and "Reselect all SmartList Selections" daily)  Diet:  NPO Pain/Anxiety/Delirium protocol (if indicated):  n/a VAP protocol (if indicated): Yes DVT prophylaxis: LMWH GI prophylaxis: PPI Glucose control:  SSI Yes Central venous access:  n/a  Arterial line:  N/A Foley:  N/A Mobility:  OOB  PT consulted: yes   Last date of multidisciplinary goals of care discussion: Discussed case with daughter, [name].Pending 4/1  Code Status:  full code Disposition: stable for transfer out of the ICU.   Labs   CBC: Recent Labs  Lab 02/14/21 0100 02/14/21 0133 02/14/21 1013 02/14/21 1024 02/15/21 0325 02/15/21 0446 02/16/21 0307 02/16/21 0819 02/17/21 0400 02/18/21 0237 02/19/21 0126  WBC 14.6*  --  14.5*   < > 11.8*  --  11.1*  --  12.9* 12.5* 12.4*  NEUTROABS 11.9*  --  12.1*  --   --   --   --   --   --   --   --   HGB 11.8*   < > 10.8*   < > 10.3*   < > 9.2* 9.2* 9.5* 9.0* 9.9*  HCT 35.6*   < > 33.8*   < >  31.0*   < > 28.9* 27.0* 30.0* 27.2* 30.3*  MCV 95.7  --  97.1   < > 94.5  --  96.3  --  96.5 95.8 93.8  PLT 353  --  352   < > 296  --  240  --  289 310 356   < > = values in this interval not displayed.    Basic Metabolic Panel: Recent Labs  Lab 02/14/21 1013 02/14/21 1024 02/15/21 0150 02/15/21 0325 02/16/21 0307 02/16/21 0819 02/16/21 0831 02/17/21 0400 02/18/21 0237 02/19/21 0126 02/19/21 0518  NA 129*   < > 131*   < > 135 135  --  136 140 141 142  K 4.4   < > 3.6   < > 3.8 3.6  --  3.0* 4.2 3.1* 3.4*  CL 93*  --  94*   < > 100  --   --  96* 104 101 102  CO2 29  --  30   < > 29  --   --  30 28 30 31   GLUCOSE 131*  --  125*   < > 150*  --   --  137* 129* 123* 126*  BUN 15  --  14   < > 16  --   --  22 26* 23 23  CREATININE 0.69  --  0.88   < > 0.59*  --   --  0.77 0.69 0.76 0.77  CALCIUM 9.0  --  9.1   < > 8.7*  --   --  8.8* 9.0 9.4 9.3  MG 1.9  --  1.9  --   --   --  1.8  --  2.0 2.2  --   PHOS 2.8  --  3.0  --   --   --   --   --   --   --   --    < > = values in this interval not displayed.   GFR: Estimated Creatinine Clearance: 81.1 mL/min (by C-G formula based on SCr of 0.77 mg/dL). Recent Labs  Lab 02/14/21 1013 02/14/21 2048 02/15/21 0150 02/16/21 0307 02/17/21 0400 02/18/21 0237 02/19/21 0126  WBC 14.5*  --    < > 11.1* 12.9* 12.5* 12.4*  LATICACIDVEN 1.5 1.1  --   --   --   --   --    < > = values in this interval not displayed.    Liver Function Tests: Recent Labs  Lab 02/14/21 0100 02/14/21 1013  AST 16 16  ALT 11 9  ALKPHOS 70 60  BILITOT 0.6 0.7  PROT 6.4* 5.8*  ALBUMIN 3.7 3.2*   No results for input(s): LIPASE, AMYLASE in the last 168 hours. No results for input(s): AMMONIA in the last 168 hours.  ABG    Component Value Date/Time   PHART 7.457 (H) 02/16/2021 0819   PCO2ART 40.9 02/16/2021 0819   PO2ART 92 02/16/2021 0819   HCO3 29.0 (H) 02/16/2021 0819   TCO2 30 02/16/2021 0819   ACIDBASEDEF 1.5 08/28/2019 1606   O2SAT 98.0  02/16/2021 0819     Coagulation Profile: No results for input(s): INR, PROTIME in the last 168 hours.  Cardiac Enzymes: No results for input(s): CKTOTAL, CKMB, CKMBINDEX, TROPONINI in the last 168 hours.  HbA1C: Hgb A1c MFr Bld  Date/Time Value Ref Range Status  02/16/2021 03:07 AM 5.4 4.8 - 5.6 % Final    Comment:    (NOTE) Pre diabetes:          5.7%-6.4%  Diabetes:              >6.4%  Glycemic control for   <7.0% adults with diabetes   11/12/2020 05:43 AM 5.8 (H) 4.8 - 5.6 % Final    Comment:    (NOTE) Pre diabetes:          5.7%-6.4%  Diabetes:              >6.4%  Glycemic control for   <7.0% adults with diabetes     CBG: Recent Labs  Lab 02/18/21 1602 02/18/21 2127 02/18/21 2323 02/19/21 0317 02/19/21 0805  GLUCAP 140* 122* 130* 113* 112*    CCT: n/a   Eliseo Gum MSN, AGACNP-BC [place] [phone] If no answer,  [phone] 02/19/2021, 8:29 AM

## 2021-02-19 NOTE — Progress Notes (Signed)
Elink replaced K+ 3.1 per eLink electrolyte replacement protocol. Creatinine 0.76 and GFR >60

## 2021-02-19 NOTE — Evaluation (Signed)
Clinical/Bedside Swallow Evaluation Patient Details  Name: Jorge Sherman MRN: [id number] Date of Birth: September 26, 1941  Today's Date: 02/19/2021 Time: SLP Start Time (ACUTE ONLY): 1000 SLP Stop Time (ACUTE ONLY): 1015 SLP Time Calculation (min) (ACUTE ONLY): 15 min  Past Medical History:  Past Medical History:  Diagnosis Date  . Chronic pain   . Depressive disorder, not elsewhere classified   . Dizziness and giddiness   . Essential and other specified forms of tremor   . Headache(784.0)   . Leukocytosis, unspecified   . Obstructive sleep apnea (adult) (pediatric)   . Pain in joint, lower leg   . Septic joint of left knee joint ([place]) 04/22/2019  . Spinal stenosis, lumbar region, without neurogenic claudication   . Thoracic or lumbosacral neuritis or radiculitis, unspecified   . Type II or unspecified type diabetes mellitus without mention of complication, not stated as uncontrolled   . Unspecified cardiovascular disease   . Unspecified essential hypertension    Past Surgical History:  Past Surgical History:  Procedure Laterality Date  . BACK SURGERY    . back surgey     times 3  . IR INJECT/THERA/INC NEEDLE/CATH/PLC EPI/LUMB/SAC W/IMG  11/26/2020   HPI:  Jorge Sherman is an 80 y/o gentleman with a history of chronic pain and opiate use, multiple previous back surgeries who was admitted with respiratory failure partially responsive to narcan. He vomited and aspirated on BiPAP while being treated with a narcan gtt, requiring intubation 3/27-3/31   Assessment / Plan / Recommendation Clinical Impression  Pt demonstrates promising ability to tolerate PO. He is alert, loud vocal quality with mild dysphonia which is likely his baseline. He has a strong volitional and intermittent baseline cough. When taking small sips pt tolerates PO well. When completing the 3 oz water swallow pt did have to briefly stop to take a breath, but no cough was elicited. He was also able to masticate soft solids  without dentition but did need a bite pf puree to moisten the bolus. Recommend pt consume a dys 3 (mech soft) diet and thin liquids. Meds can be taken with water or puree. Will f/u for tolerance briefly. SLP Visit Diagnosis: Dysphagia, oropharyngeal phase (R13.12)    Aspiration Risk  Mild aspiration risk    Diet Recommendation Dysphagia 3 (Mech soft);Thin liquid   Liquid Administration via: Straw;Cup Medication Administration: Whole meds with liquid Supervision: Staff to assist with self feeding Compensations: Slow rate;Small sips/bites;Follow solids with liquid Postural Changes: Seated upright at 90 degrees    Other  Recommendations     Follow up Recommendations Skilled Nursing facility      Frequency and Duration min 2x/week  2 weeks       Prognosis        Swallow Study   General HPI: Jorge Sherman is an 80 y/o gentleman with a history of chronic pain and opiate use, multiple previous back surgeries who was admitted with respiratory failure partially responsive to narcan. He vomited and aspirated on BiPAP while being treated with a narcan gtt, requiring intubation 3/27-3/31 Type of Study: Bedside Swallow Evaluation Diet Prior to this Study: NPO Temperature Spikes Noted: No Respiratory Status: Nasal cannula History of Recent Intubation: Yes Length of Intubations (days): 5 days Date extubated: 02/18/21 Behavior/Cognition: Alert;Cooperative Oral Cavity Assessment: Within Functional Limits Oral Care Completed by SLP: No Oral Cavity - Dentition: Edentulous Self-Feeding Abilities: Total assist Patient Positioning: Partially reclined;Postural control interferes with function Baseline Vocal Quality: Normal Volitional Cough: Strong Volitional Swallow: Able  to elicit    Oral/Motor/Sensory Function Overall Oral Motor/Sensory Function: Within functional limits   Ice Chips Ice chips: Within functional limits   Thin Liquid Thin Liquid: Within functional limits    Nectar Thick  Nectar Thick Liquid: Not tested   Honey Thick Honey Thick Liquid: Not tested   Puree Puree: Within functional limits   Solid     Solid: Not tested     Herbie Baltimore, MA [place] Pager [phone] Office [phone]  Lynann Beaver 02/19/2021,10:28 AM

## 2021-02-19 NOTE — Progress Notes (Signed)
Nutrition Follow-up  DOCUMENTATION CODES:   Obesity unspecified  INTERVENTION:   Ensure Max po BID, each supplement provides 150 kcal and 30 grams of protein.  MVI with minerals daily.  NUTRITION DIAGNOSIS:   Inadequate oral intake related to inability to eat as evidenced by NPO status.  Ongoing, diet just advanced.  GOAL:   Patient will meet greater than or equal to 90% of their needs  MONITOR:   PO intake,Supplement acceptance,Labs  REASON FOR ASSESSMENT:   Ventilator,Consult Enteral/tube feeding initiation and management  ASSESSMENT:   80 yo male admitted with hypercarbic respiratory failure. Failed BiPAP and required intubation. PMH includes HTN, DM, chronic pain, depression, spinal stenosis.  Discussed patient in ICU rounds and with RN today. Patient was extubated 3/31. SLP following. Diet has been advanced to dysphagia 3 with thin liquids. Patient is hungry and thirsty, suspect intake will be good. Patient would benefit from additional protein intake, will add PO supplement.  Labs reviewed. K 3.4 CBG: 113-112-130-136  Medications reviewed and include MVI with minerals, miralax, Senokot-S.  Weight down to 99.1 kg today. Weight loss likely r/t fluid status.   NUTRITION - FOCUSED PHYSICAL EXAM:  Flowsheet Row Most Recent Value  Orbital Region No depletion  Upper Arm Region No depletion  Thoracic and Lumbar Region No depletion  Buccal Region No depletion  Temple Region Mild depletion  Clavicle Bone Region No depletion  Clavicle and Acromion Bone Region No depletion  Scapular Bone Region No depletion  Dorsal Hand No depletion  Patellar Region Mild depletion  Anterior Thigh Region No depletion  Posterior Calf Region No depletion  Edema (RD Assessment) Mild  Hair Reviewed  Eyes Unable to assess  Mouth Unable to assess  Skin Reviewed  Nails Reviewed       Diet Order:   Diet Order            DIET DYS 3 Room service appropriate? Yes; Fluid  consistency: Thin  Diet effective now                 EDUCATION NEEDS:   Not appropriate for education at this time  Skin:  Skin Assessment: Reviewed RN Assessment  Last BM:  3/31 type 7  Height:   Ht Readings from Last 1 Encounters:  02/14/21 5\' 6"  (1.676 m)    Weight:   Wt Readings from Last 1 Encounters:  02/19/21 99.1 kg    Ideal Body Weight:  64.5 kg  BMI:  Body mass index is 35.26 kg/m.  Estimated Nutritional Needs:   Kcal:  1900-2100  Protein:  100-120 gm  Fluid:  >/= 2 L    Lucas Mallow, RD, LDN, CNSC Please refer to Amion for contact information.

## 2021-02-19 NOTE — Progress Notes (Signed)
[facility] Progress Note Patient Name: Jorge Sherman DOB: [birth year] MRN: [id number]   Date of Service  02/19/2021  HPI/Events of Note  Notified of patient complaining of right hand pain and swelling which seems to be where a previous IV site was. SBP 170s, amlodipine not given earlier, unclear why.  eICU Interventions  Ordered Tylenol prn Give one dose of amlodipine 10 now Warm compress on hand     Intervention Category Intermediate Interventions: Hypertension - evaluation and management;Other: Minor Interventions: Routine modifications to care plan (e.g. PRN medications for pain, fever)  Jorge Sherman Jorge Sherman 02/19/2021, 9:20 PM

## 2021-02-20 ENCOUNTER — Inpatient Hospital Stay (HOSPITAL_COMMUNITY): Payer: Medicare HMO

## 2021-02-20 DIAGNOSIS — J9602 Acute respiratory failure with hypercapnia: Secondary | ICD-10-CM | POA: Diagnosis not present

## 2021-02-20 LAB — CULTURE, BLOOD (ROUTINE X 2)
Culture: NO GROWTH
Culture: NO GROWTH

## 2021-02-20 NOTE — Progress Notes (Addendum)
Upon assessment, patient states pain was 9 on 0-10 pain scale. Right hand was swollen. Patient was hypertensive too. Called [facility]ink requesting for prn pain meds. Prn tylenol, and one dose of amlodipine was ordered.

## 2021-02-20 NOTE — Progress Notes (Signed)
Pt arrived to unit from 78M,accompanied by staff nurse. Pt alert/orientated to unit/room equipments. Welcome guide/menu provided with instructions.Pt verbalized understanding of instructions. Hospital valuables policy has been discussed with no complaints. Voiced. Per family pt's son would visit tomorrow and will keep the 2 rings, a watch and a bracelet.. hospital bed in lowest position with 3 side rails up , call bell/room phone within reach and all wheels locked.

## 2021-02-20 NOTE — Progress Notes (Signed)
Patient has been yelling, and irritable. 4 p's, patient is in no acute distress Several nursing personal has been in room to accommodate patient needs.Spoke with Yvette Rack and explained to her that patient seems confused and his needs are met.Daughter understood and will call to check on father in the AM.

## 2021-02-20 NOTE — Progress Notes (Signed)
PROGRESS NOTE  Jorge Sherman KXF:[id number] DOB: [birth year] DOA: 02/13/2021 PCP: Merrilee Seashore, MD  HPI/Recap of past 24 hours: This is an 80 year old gentleman with a past medical history of hypertension, diabetes, chronic pain, depression. Lives at a facility. He was found this morning to have O2 sats in the 80s he was transferred to the emergency department. He was given Narcan x2 he responded briefly. He has been a DNR in the past however discussions with family this morning have wanted to reverse this CODE STATUS for him to do everything that we could to possibly keep him alive. Patient was placed on a Narcan drip. He started vomiting while on BAP BiPAP. There is definite concern for aspiration.  Upon presentation to the room the patient was covered in lime green and bilious vomitus across the mouth beard and chest. O2 sats were stable in the high 90s however patient was somnolent. He will open eyes to voice. He was found to be hypercarbic with an elevated PCO2. At this point vomiting unable to continue BiPAP therapy decision was made for endotracheal intubation. Please see separate procedure note. Pulmonary critical care was consulted for recommendations and management and admission to the intensive care.  02/20/21: Patient was seen and examined at bedside.  He has mild slurring of speech.  CT head was unremarkable for any acute intracranial findings.  Physical exam was non focal.   Assessment/Plan: Principal Problem:   Acute respiratory failure with hypercapnia (HCC) Active Problems:   Chronic pain syndrome   Bilious emesis   Acute hypercapnic respiratory failure (HCC)  Aspiration pneumonia/pneumonitis. Completed 5 days of Unasyn. Continue to maintain O2 saturation greater than 90%.  Acute hypoxic respiratory failure secondary to above Maintaining O2 saturation: 92% Currently on 2 L O2 saturation 99%. Wean off oxygen supplementation as tolerated. Home O2  evaluation prior to DC.  Slurring speech CT head was unremarkable for any acute intracranial findings.  Physical exam was non focal.  Possible dysphagia SLP assessment  Aspiration precautions  Chronic pain syndrome Continue home Suboxone, oxycodone, gabapentin We will need to follow-up with pain clinic outpatient.  Hypokalemia Serum potassium 3.4 Repleted, no labs today, we will repeat in the morning.  OSA CPAP at night  Chronic normocytic anemia Hemoglobin uprising 9.9 from 9.0 No labs today we will repeat in the morning.  Essential hypertension/GERD Stable Continue home regimen Continue to monitor vital signs BP is at goal.  Code Status: Full code  Family Communication: None at bedside  Disposition Plan: Likely will discharge to home with home health services.   Consultants:  PCCM  Procedures:  Intubation  Extubation  Antimicrobials:  Completed 5 days of Unasyn.  DVT prophylaxis: Subcu enoxaparin daily  Status is: Inpatient    Dispo: The patient is from: Home              Anticipated d/c is to: Likely home with home health services.              Patient currently not stable for discharge, ongoing management of acute hypoxia.   Difficult to place patient: Not applicable.        Objective: Vitals:   02/20/21 1400 02/20/21 1500 02/20/21 1508 02/20/21 1600  BP: (!) 146/72 130/74 130/74 136/71  Pulse: 81 74 73 84  Resp: 19 20 (!) 22 14  Temp:      TempSrc:      SpO2: 98% 100% 99% 99%  Weight:      Height:  Intake/Output Summary (Last 24 hours) at 02/20/2021 1641 Last data filed at 02/20/2021 0600 Gross per 24 hour  Intake 110 ml  Output 400 ml  Net -290 ml   Filed Weights   02/17/21 0342 02/19/21 0500 02/20/21 0400  Weight: 110.6 kg 99.1 kg 103 kg    Exam:  . General: 80 y.o. year-old male well developed well nourished in no acute distress.  Alert and oriented x3. . Cardiovascular: Regular rate and rhythm with no rubs or  gallops.  No thyromegaly or JVD noted.   [name] Respiratory: Mild rales at bases with no wheezing noted.  Poor inspiratory effort.   . Abdomen: Soft nontender nondistended with normal bowel sounds x4 quadrants. . Musculoskeletal: No lower extremity edema. 2/4 pulses in all 4 extremities. . Skin: No ulcerative lesions noted or rashes, . Psychiatry: Mood is appropriate for condition and setting   Data Reviewed: CBC: Recent Labs  Lab 02/14/21 0100 02/14/21 0133 02/14/21 1013 02/14/21 1024 02/15/21 0325 02/15/21 0446 02/16/21 0307 02/16/21 0819 02/17/21 0400 02/18/21 0237 02/19/21 0126  WBC 14.6*  --  14.5*   < > 11.8*  --  11.1*  --  12.9* 12.5* 12.4*  NEUTROABS 11.9*  --  12.1*  --   --   --   --   --   --   --   --   HGB 11.8*   < > 10.8*   < > 10.3*   < > 9.2* 9.2* 9.5* 9.0* 9.9*  HCT 35.6*   < > 33.8*   < > 31.0*   < > 28.9* 27.0* 30.0* 27.2* 30.3*  MCV 95.7  --  97.1   < > 94.5  --  96.3  --  96.5 95.8 93.8  PLT 353  --  352   < > 296  --  240  --  289 310 356   < > = values in this interval not displayed.   Basic Metabolic Panel: Recent Labs  Lab 02/14/21 1013 02/14/21 1024 02/15/21 0150 02/15/21 0325 02/16/21 0307 02/16/21 0819 02/16/21 0831 02/17/21 0400 02/18/21 0237 02/19/21 0126 02/19/21 0518  NA 129*   < > 131*   < > 135 135  --  136 140 141 142  K 4.4   < > 3.6   < > 3.8 3.6  --  3.0* 4.2 3.1* 3.4*  CL 93*  --  94*   < > 100  --   --  96* 104 101 102  CO2 29  --  30   < > 29  --   --  _0 GLUCOSE 131*  --  125*   < > 150*  --   --  137* 129* 123* 126*  BUN 15  --  14   < > 16  --   --  22 26* 23 23  CREATININE 0.69  --  0.88   < > 0.59*  --   --  0.77 0.69 0.76 0.77  CALCIUM 9.0  --  9.1   < > 8.7*  --   --  8.8* 9.0 9.4 9.3  MG 1.9  --  1.9  --   --   --  1.8  --  2.0 2.2  --   PHOS 2.8  --  3.0  --   --   --   --   --   --   --   --    < > = values  in this interval not displayed.   GFR: Estimated Creatinine Clearance: 82.8 mL/min (by C-G  formula based on SCr of 0.77 mg/dL). Liver Function Tests: Recent Labs  Lab 02/14/21 0100 02/14/21 1013  AST 16 16  ALT 11 9  ALKPHOS 70 60  BILITOT 0.6 0.7  PROT 6.4* 5.8*  ALBUMIN 3.7 3.2*   No results for input(s): LIPASE, AMYLASE in the last 168 hours. No results for input(s): AMMONIA in the last 168 hours. Coagulation Profile: No results for input(s): INR, PROTIME in the last 168 hours. Cardiac Enzymes: No results for input(s): CKTOTAL, CKMB, CKMBINDEX, TROPONINI in the last 168 hours. BNP (last 3 results) No results for input(s): PROBNP in the last 8760 hours. HbA1C: No results for input(s): HGBA1C in the last 72 hours. CBG: Recent Labs  Lab 02/18/21 2323 02/19/21 0317 02/19/21 0805 02/19/21 1126 02/19/21 1537  GLUCAP 130* 113* 112* 130* 136*   Lipid Profile: No results for input(s): CHOL, HDL, LDLCALC, TRIG, CHOLHDL, LDLDIRECT in the last 72 hours. Thyroid Function Tests: No results for input(s): TSH, T4TOTAL, FREET4, T3FREE, THYROIDAB in the last 72 hours. Anemia Panel: No results for input(s): VITAMINB12, FOLATE, FERRITIN, TIBC, IRON, RETICCTPCT in the last 72 hours. Urine analysis:    Component Value Date/Time   COLORURINE AMBER (A) 02/14/2021 1220   APPEARANCEUR CLOUDY (A) 02/14/2021 1220   LABSPEC 1.021 02/14/2021 1220   PHURINE 5.0 02/14/2021 1220   GLUCOSEU NEGATIVE 02/14/2021 1220   HGBUR NEGATIVE 02/14/2021 1220   BILIRUBINUR NEGATIVE 02/14/2021 1220   KETONESUR 5 (A) 02/14/2021 1220   PROTEINUR 100 (A) 02/14/2021 1220   NITRITE NEGATIVE 02/14/2021 1220   LEUKOCYTESUR SMALL (A) 02/14/2021 1220   Sepsis Labs: _0 (procalcitonin:4,lacticidven:4)  ) Recent Results (from the past 240 hour(s))  SARS CORONAVIRUS 2 (TAT 6-24 HRS) Nasopharyngeal Nasopharyngeal Swab     Status: None   Collection Time: 02/14/21  6:30 AM   Specimen: Nasopharyngeal Swab  Result Value Ref Range Status   SARS Coronavirus 2 NEGATIVE NEGATIVE Final    Comment:  (NOTE) SARS-CoV-2 target nucleic acids are NOT DETECTED.  The SARS-CoV-2 RNA is generally detectable in upper and lower respiratory specimens during the acute phase of infection. Negative results do not preclude SARS-CoV-2 infection, do not rule out co-infections with other pathogens, and should not be used as the sole basis for treatment or other patient management decisions. Negative results must be combined with clinical observations, patient history, and epidemiological information. The expected result is Negative.  Fact Sheet for Patients: SugarRoll.be  Fact Sheet for Healthcare Providers: https://www.woods-mathews.com/  This test is not yet approved or cleared by the [place] FDA and  has been authorized for detection and/or diagnosis of SARS-CoV-2 by FDA under an Emergency Use Authorization (EUA). This EUA will remain  in effect (meaning this test can be used) for the duration of the COVID-19 declaration under Se ction 564(b)(1) of the Act, 21 U.S.C. section 360bbb-3(b)(1), unless the authorization is terminated or revoked sooner.  Performed at [facility], [place] [address], [place], [place] [address]   Urine culture     Status: Abnormal   Collection Time: 02/14/21  9:36 AM   Specimen: Urine, Clean Catch  Result Value Ref Range Status   Specimen Description URINE, CLEAN CATCH  Final   Special Requests   Final    NONE Performed at [facility], [place] [address] [place], [place] [address]    Culture MULTIPLE SPECIES PRESENT, SUGGEST RECOLLECTION (A)  Final  Report Status 02/16/2021 FINAL  Final  Culture, blood (routine x 2)     Status: None   Collection Time: 02/14/21 10:56 AM   Specimen: BLOOD RIGHT HAND  Result Value Ref Range Status   Specimen Description BLOOD RIGHT HAND  Final   Special Requests   Final    BOTTLES DRAWN AEROBIC ONLY Blood Culture results may not be optimal due to an inadequate volume of  blood received in culture bottles   Culture   Final    NO GROWTH 5 DAYS Performed at [facility], [place] [address], [place], [place] [address]    Report Status 02/19/2021 FINAL  Final  MRSA PCR Screening     Status: None   Collection Time: 02/14/21 12:41 PM   Specimen: Nasal Mucosa; Nasopharyngeal  Result Value Ref Range Status   MRSA by PCR NEGATIVE NEGATIVE Final    Comment:        The GeneXpert MRSA Assay (FDA approved for NASAL specimens only), is one component of a comprehensive MRSA colonization surveillance program. It is not intended to diagnose MRSA infection nor to guide or monitor treatment for MRSA infections. Performed at [facility], [place] [address] [place], [place] [address]   Culture, blood (routine x 2)     Status: None   Collection Time: 02/15/21  1:50 AM   Specimen: BLOOD  Result Value Ref Range Status   Specimen Description BLOOD RIGHT ANTECUBITAL  Final   Special Requests   Final    BOTTLES DRAWN AEROBIC ONLY Blood Culture results may not be optimal due to an excessive volume of blood received in culture bottles   Culture   Final    NO GROWTH 5 DAYS Performed at [facility], [place] [address], [place], [place] [address]    Report Status 02/20/2021 FINAL  Final  Culture, blood (routine x 2)     Status: None   Collection Time: 02/15/21  2:03 AM   Specimen: BLOOD  Result Value Ref Range Status   Specimen Description BLOOD RIGHT ANTECUBITAL  Final   Special Requests   Final    BOTTLES DRAWN AEROBIC ONLY Blood Culture results may not be optimal due to an excessive volume of blood received in culture bottles   Culture   Final    NO GROWTH 5 DAYS Performed at [facility], [place] [address], [place], [place] [address]    Report Status 02/20/2021 FINAL  Final      Studies: CT HEAD WO CONTRAST  Result Date: 02/20/2021 CLINICAL DATA:  Anisocoria EXAM: CT HEAD WITHOUT CONTRAST TECHNIQUE: Contiguous axial images were obtained from the  base of the skull through the vertex without intravenous contrast. COMPARISON:  02/14/2021 FINDINGS: Brain: There is no mass, hemorrhage or extra-axial collection. There is generalized atrophy without lobar predilection. Hypodensity of the white matter is most commonly associated with chronic microvascular disease. Vascular: Atherosclerotic calcification of the internal carotid arteries at the skull base. No abnormal hyperdensity of the major intracranial arteries or dural venous sinuses. Skull: The visualized skull base, calvarium and extracranial soft tissues are normal. Sinuses/Orbits: No fluid levels or advanced mucosal thickening of the visualized paranasal sinuses. No mastoid or middle ear effusion. The orbits are normal. IMPRESSION: Generalized atrophy and chronic microvascular ischemia without acute intracranial abnormality. Electronically Signed   By: Ulyses Jarred M.D.   On: 02/20/2021 03:45    Scheduled Meds: . amLODipine  10 mg Oral Daily  . chlorhexidine gluconate (  MEDLINE KIT)  15 mL Mouth Rinse BID  . Chlorhexidine Gluconate Cloth  6 each Topical Daily  . enoxaparin (LOVENOX) injection  55 mg Subcutaneous Q24H  . ipratropium-albuterol  3 mL Nebulization TID  . multivitamin with minerals  1 tablet Oral Daily  . pantoprazole  40 mg Oral Daily  . polyethylene glycol  17 g Oral BID  . Ensure Max Protein  11 oz Oral BID  . senna-docusate  1 tablet Oral BID  . sodium chloride flush  10-40 mL Intracatheter Q12H    Continuous Infusions: . sodium chloride Stopped (02/15/21 1055)     LOS: 6 days     Kayleen Memos, MD [facility] Pager [phone]  If 7PM-7AM, please contact night-coverage [email or web address] Password [facility] 02/20/2021, 4:41 PM

## 2021-02-20 NOTE — Progress Notes (Signed)
Upon assessment, patient has unequal pupil. Left pupil is a 4 and right pupil is a 2. Patient is able to follow commands and A/Ox4. Patient has slurred speech since being extubated 02/18/21. Patient is weak in all extremities but equal.

## 2021-02-20 NOTE — Progress Notes (Signed)
Pt's valuable has been forwarded to security for safe keeping,

## 2021-02-20 NOTE — Significant Event (Addendum)
Rapid Response Event Note   Reason for Call : Pupillary change and neuro assessment Initial Focused Assessment:  I was notified by Nursing staff regarding [name] change in pupil size. Upon arrival, [name] is alert, oriented x4 and appropriate. Pupils: R 2, L 4 both reactive to light. His speech is also slurred. [name] states his speech is slurred more than normal (edentulous). He is profoundly weak and deconditioned but equally weak on both sides. His head is tilted to the left.  His LSW would be prior to intubation.  Reportedly and documented, he had slurred speech after extubation on 3/31 which has continued consistently and taken by staff as his baseline. Pt is on Lovenox for DVT prophylaxis but otherwise not on anticoagulation. Now tonight his pupils were recognized to be unequal during a routine assessment which is different than 2000 tonight. Pt received a Duoneb at 2043.  Due to unknown LSW and lack of LVO symptoms (neglect, vision loss, aphasia), there is not a clinical indication to initiate a code stroke. Consider Head CT to r/o possible stroke.   NIHSS 9 1a. 0 1b. 0 1c. 0 2. 0 3. 0 4. 0 5a. 2 5b. 2 6a. 2 6b. 2 7. 0 8. 0 9. 0 10. 1 11. 0    MD Notified: Dr. Genevive Bi per Harvel Quale RN [facility]) Call Time: [phone] Arrival Time:  3794 End Time: 0122  Madelynn Done, RN

## 2021-02-21 DIAGNOSIS — J9602 Acute respiratory failure with hypercapnia: Secondary | ICD-10-CM | POA: Diagnosis not present

## 2021-02-21 LAB — CBC
HCT: 30.2 % — ABNORMAL LOW (ref 39.0–52.0)
Hemoglobin: 9.7 g/dL — ABNORMAL LOW (ref 13.0–17.0)
MCH: 30.4 pg (ref 26.0–34.0)
MCHC: 32.1 g/dL (ref 30.0–36.0)
MCV: 94.7 fL (ref 80.0–100.0)
Platelets: 437 10*3/uL — ABNORMAL HIGH (ref 150–400)
RBC: 3.19 MIL/uL — ABNORMAL LOW (ref 4.22–5.81)
RDW: 13.4 % (ref 11.5–15.5)
WBC: 12.5 10*3/uL — ABNORMAL HIGH (ref 4.0–10.5)
nRBC: 0 % (ref 0.0–0.2)

## 2021-02-21 LAB — COMPREHENSIVE METABOLIC PANEL
ALT: 77 U/L — ABNORMAL HIGH (ref 0–44)
AST: 45 U/L — ABNORMAL HIGH (ref 15–41)
Albumin: 2.8 g/dL — ABNORMAL LOW (ref 3.5–5.0)
Alkaline Phosphatase: 57 U/L (ref 38–126)
Anion gap: 9 (ref 5–15)
BUN: 12 mg/dL (ref 8–23)
CO2: 29 mmol/L (ref 22–32)
Calcium: 9.5 mg/dL (ref 8.9–10.3)
Chloride: 100 mmol/L (ref 98–111)
Creatinine, Ser: 0.7 mg/dL (ref 0.61–1.24)
GFR, Estimated: >60 mL/min (ref >60)
Glucose, Bld: 139 mg/dL — ABNORMAL HIGH (ref 70–99)
Potassium: 3 mmol/L — ABNORMAL LOW (ref 3.5–5.1)
Sodium: 138 mmol/L (ref 135–145)
Total Bilirubin: 0.6 mg/dL (ref 0.3–1.2)
Total Protein: 5.8 g/dL — ABNORMAL LOW (ref 6.5–8.1)

## 2021-02-21 LAB — CREATININE, SERUM
Creatinine, Ser: 0.75 mg/dL (ref 0.61–1.24)
GFR, Estimated: >60 mL/min (ref >60)

## 2021-02-21 LAB — POTASSIUM: Potassium: 4.3 mmol/L (ref 3.5–5.1)

## 2021-02-21 LAB — PHOSPHORUS: Phosphorus: 3.3 mg/dL (ref 2.5–4.6)

## 2021-02-21 LAB — MAGNESIUM: Magnesium: 2.1 mg/dL (ref 1.7–2.4)

## 2021-02-21 MED ORDER — THIAMINE HCL 100 MG PO TABS
100.0000 mg | ORAL_TABLET | Freq: Every day | ORAL | Status: DC
  Administered 2021-02-21 – 2021-02-24 (×4): 100 mg via ORAL
  Filled 2021-02-21 (×5): qty 1

## 2021-02-21 MED ORDER — LORAZEPAM 2 MG/ML IJ SOLN: 1.0000 mg | INTRAMUSCULAR | Status: DC | PRN

## 2021-02-21 MED ORDER — GABAPENTIN 100 MG PO CAPS
100.0000 mg | ORAL_CAPSULE | Freq: Three times a day (TID) | ORAL | Status: DC
  Administered 2021-02-21 – 2021-02-23 (×5): 100 mg via ORAL
  Filled 2021-02-21 (×5): qty 1

## 2021-02-21 MED ORDER — DULOXETINE HCL 30 MG PO CPEP
30.0000 mg | ORAL_CAPSULE | Freq: Two times a day (BID) | ORAL | Status: DC
  Administered 2021-02-21 – 2021-02-24 (×6): 30 mg via ORAL
  Filled 2021-02-21 (×6): qty 1

## 2021-02-21 MED ORDER — FOLIC ACID 1 MG PO TABS
1.0000 mg | ORAL_TABLET | Freq: Every day | ORAL | Status: DC
  Administered 2021-02-21 – 2021-02-24 (×4): 1 mg via ORAL
  Filled 2021-02-21 (×4): qty 1

## 2021-02-21 MED ORDER — ENOXAPARIN SODIUM 60 MG/0.6ML ~~LOC~~ SOLN
50.0000 mg | SUBCUTANEOUS | Status: DC
  Administered 2021-02-22 – 2021-02-24 (×3): 50 mg via SUBCUTANEOUS
  Filled 2021-02-21 (×3): qty 0.6

## 2021-02-21 MED ORDER — THIAMINE HCL 100 MG/ML IJ SOLN: 100.0000 mg | Freq: Every day | INTRAMUSCULAR | Status: DC

## 2021-02-21 MED ORDER — ADULT MULTIVITAMIN W/MINERALS CH: 1.0000 | ORAL_TABLET | Freq: Every day | ORAL | Status: DC

## 2021-02-21 MED ORDER — LORAZEPAM 1 MG PO TABS
1.0000 mg | ORAL_TABLET | ORAL | Status: DC | PRN
Start: 2021-02-21 — End: 2021-02-24
  Filled 2021-02-21: qty 1

## 2021-02-21 MED ORDER — LORAZEPAM 0.5 MG PO TABS
0.5000 mg | ORAL_TABLET | Freq: Three times a day (TID) | ORAL | Status: DC | PRN
  Administered 2021-02-21 – 2021-02-23 (×3): 0.5 mg via ORAL
  Filled 2021-02-21 (×3): qty 1

## 2021-02-21 NOTE — Progress Notes (Signed)
Rt placed pt on cpap as ordered.  RT left room but returned in a few minutes to check on pt.  Pt requested the mask be removed.  Pt states he does not wear cpap at home.  RT replaced [place] at 3LPM and pt was left resting.  RT will continue to monitor.  RN aware.

## 2021-02-21 NOTE — Evaluation (Signed)
Occupational Therapy Evaluation Patient Details Name: Jorge Sherman MRN: [id number] DOB: 27-Nov-1940 Today's Date: 02/21/2021    History of Present Illness Pt adm 3/26 from SNF with acute hypoxic respiratory failure with likely aspiration PNA after vomiting. Bilious vomiting likely due to chronic opiate use due to chronic pain. Narcan given at facility. Pt intubated 3/27-3/31. PMH - chronic pain, multiple back surgeries, HTN, DM, depression.   Clinical Impression   PTA, pt was living at a SNF and required assistance for ADLs and use a scooter for mobility. Pt currently performing grooming with Min A at bed level and requiring Max-Total A for bathing, dressing, and toileting. Pt repositioning in bed with Min A and use of trendelenburg position. Optimizing upright posture with chair position in bed for performance of ADLs. Pt presenting with decreased strength, cognition, and activity tolerance. Elevating BUEs on pillow due to BUE edema; notified RN. Pt would benefit from further acute OT to facilitate safe dc. Recommend dc to SNF for further OT to optimize safety, independence with ADLs, and return to PLOF.     Follow Up Recommendations  SNF    Equipment Recommendations  Other (comment) (Defer to next venue)    Recommendations for Other Services PT consult     Precautions / Restrictions Precautions Precautions: Fall Precaution Comments: 2L O2      Mobility Bed Mobility Overal bed mobility: Needs Assistance Bed Mobility: Supine to Sit;Sit to Supine;Rolling           General bed mobility comments: Pt able to follow cus for repositioning in bed with Min A. Use of bending BLEs and bed in trendeleburg to have pt push towards HOB.    Transfers                 General transfer comment: Defered for safety    Balance                                           ADL either performed or assessed with clinical judgement   ADL Overall ADL's : Needs  assistance/impaired Eating/Feeding: Minimal assistance;Bed level   Grooming: Oral care;Bed level;Minimal assistance Grooming Details (indicate cue type and reason): Optimizing upright posture in bed. Pt requiring assistance for taking cap off tooth paste and increased encouragement to initate task due to slight self limiting behaviors. Once started, pt able to complete oral care at bed level                               General ADL Comments: Pt requiring Max A for bathing, dressing, and toileting at bed level.     Vision Baseline Vision/History: Wears glasses (Pt reports he wears glasses but unsure of reliability)       Perception     Praxis      Pertinent Vitals/Pain Pain Assessment: No/denies pain     Hand Dominance Right   Extremity/Trunk Assessment Upper Extremity Assessment Upper Extremity Assessment: RUE deficits/detail;LUE deficits/detail RUE Deficits / Details: Poor grasp strength and coorindation. Noting increased edema. Pt able to bring hand to mouth during oral care and bringing cup to mouth. RUE Coordination: decreased fine motor;decreased gross motor LUE Deficits / Details: Poor grasp strength and coorindation. Noting increased edema. Pt able to bring hand to mouth during oral care and bringing cup to mouth. LUE Coordination: decreased  gross motor;decreased fine motor   Lower Extremity Assessment Lower Extremity Assessment: Defer to PT evaluation RLE Deficits / Details: grossly <3/5 LLE Deficits / Details: grossly <3/5   Cervical / Trunk Assessment Cervical / Trunk Assessment: Kyphotic   Communication Communication Communication: HOH   Cognition Arousal/Alertness: Awake/alert Behavior During Therapy: WFL for tasks assessed/performed Overall Cognitive Status: No family/caregiver present to determine baseline cognitive functioning                                 General Comments: Slight self limiting behavior. Requiring increased  encouragement for performing ADLs. Following direct, one step commands.   General Comments  SpO2 100% on 2L O2    Exercises     Shoulder Instructions      Home Living Family/patient expects to be discharged to:: Skilled nursing facility                                        Prior Functioning/Environment Level of Independence: Needs assistance  Gait / Transfers Assistance Needed: Pt has been nonambulatory for quite some time. Prior to hospitalizations beginning in Dec 2021 he was performing scooting transfer to electric scooter. Pt was unable to tell me what he was doing mobility wise at SNF currently. ADL's / Homemaking Assistance Needed: Pt reports aide sits outside door while he showers and fixes meals occasionally   Comments: Pt reports had personal aide 24 hrs/day 7 days a week, but they "got paid upfront" and haven't been back since.        OT Problem List: Decreased strength;Decreased range of motion;Decreased activity tolerance;Impaired balance (sitting and/or standing);Decreased safety awareness;Decreased knowledge of use of DME or AE;Decreased cognition;Decreased knowledge of precautions;Impaired UE functional use;Increased edema      OT Treatment/Interventions: Self-care/ADL training;Therapeutic exercise;Energy conservation;DME and/or AE instruction;Therapeutic activities;Patient/family education    OT Goals(Current goals can be found in the care plan section) Acute Rehab OT Goals Patient Stated Goal: "I want to watch something else" OT Goal Formulation: With patient Time For Goal Achievement: 03/07/21 Potential to Achieve Goals: Good  OT Frequency: Min 2X/week   Barriers to D/C:            Co-evaluation              AM-PAC OT "6 Clicks" Daily Activity     Outcome Measure Help from another person eating meals?: A Little Help from another person taking care of personal grooming?: A Little Help from another person toileting, which includes  using toliet, bedpan, or urinal?: Total Help from another person bathing (including washing, rinsing, drying)?: A Lot Help from another person to put on and taking off regular upper body clothing?: A Lot Help from another person to put on and taking off regular lower body clothing?: Total 6 Click Score: 12   End of Session Equipment Utilized During Treatment: Oxygen Nurse Communication: Mobility status  Activity Tolerance: Patient tolerated treatment well;Patient limited by fatigue Patient left: in bed;with call bell/phone within reach;with bed alarm set  OT Visit Diagnosis: Unsteadiness on feet (R26.81);Other abnormalities of gait and mobility (R26.89);Muscle weakness (generalized) (M62.81)                Time: 2563-8937 OT Time Calculation (min): 21 min Charges:  OT General Charges $OT Visit: 1 Visit OT Evaluation $OT Eval Moderate Complexity: 1 Mod  Central, OTR/L Acute Rehab Pager: [phone] Office: [place] 02/21/2021, 4:10 PM

## 2021-02-21 NOTE — Progress Notes (Addendum)
PROGRESS NOTE  Jorge Sherman DJS:[id number] DOB: 21-Jul-1941 DOA: 02/13/2021 PCP: Merrilee Seashore, MD  HPI/Recap of past 24 hours: This is an 80 year old gentleman with a past medical history of hypertension, chronic pain, chronic depression. He was found on the morning of his presentation at his SNF with acute hypoxemia with O2 saturation in the 80s.  He was transferred to the emergency department. He was given Narcan x2 he responded briefly. He has been a DNR in the past however discussions with family led to reversal of his CODE STATUS to full code. Patient was placed on a Narcan drip. He was found to be hypercarbic with an elevated PCO2.  He started vomiting while on BiPAP. There was definite concern for aspiration.  Unable to continue BiPAP therapy, decision was made for endotracheal intubation.  Pulmonary critical care was consulted for recommendations and management and admission to the intensive care.  TR[facility] assumed care on 02/20/21.  Hospital course complicated by waxing and waning mentation status.  CT head was unremarkable for any acute intracranial findings on 4-22.  02/21/21: Seen and examined at his bedside.  Somnolent but is arousable to voices.  He is currently on 2 L with O2 saturation of 100%.  He has completed the course of his antibiotics.  TOC has been consulted to assist with SNF placement.   Assessment/Plan: Principal Problem:   Acute respiratory failure with hypercapnia (HCC) Active Problems:   Chronic pain syndrome   Bilious emesis   Acute hypercapnic respiratory failure (HCC)  Aspiration pneumonia and pneumonitis. Completed 5 days of Unasyn. O2 saturation 100% on 2 L.  Acute hypoxic respiratory failure secondary to above Currently O2 saturation 100% on 2 L, wean oxygen supplementation as tolerated. Home O2 evaluation prior to DC.  Slurring speech/delirium CT head was unremarkable for any acute intracranial findings.   Physical exam was non  focal. Mentation has waxed and waned.  Possible dysphagia Speech assessment recommended dysphagia 3 (mechanical soft), thin liquid diet Continue aspiration precautions  Chronic pain syndrome At home he was on Suboxone, oxycodone, gabapentin No analgesics given recently Continue to monitor  Resolved post repletion hypokalemia Serum potassium 3.4>> 4.3  OSA CPAP at night  Chronic normocytic anemia Hemoglobin uprising 9.9 from 9.0  Essential hypertension/GERD BP is at goal. Continue home Norvasc 10 mg daily. Continue to monitor vital signs.  Physical debility/ambulatory dysfunction. PT assessed and recommending SNF TOC consulted to assist with SNF placement. Patient was admitted from SNF. Continue fall precautions.  Code Status: Full code  Family Communication: None at bedside  Disposition Plan: Likely will discharge to SNF once a bed is available.   Consultants:  PCCM  Procedures:  Intubation  Extubation  Antimicrobials:  Completed 5 days of Unasyn.  DVT prophylaxis: Subcu enoxaparin daily  Status is: Inpatient    Dispo: The patient is from: Home              Anticipated d/c is to: SNF once a bed is available.              Patient currently is stable for discharge to SNF.   Difficult to place patient: Not applicable.        Objective: Vitals:   02/21/21 0355 02/21/21 0735 02/21/21 0930 02/21/21 1531  BP: 131/72 135/70  128/74  Pulse: 71 83  76  Resp: _0 Temp: 98.3 F (36.8 C) 98.3 F (36.8 C)    TempSrc: Oral Oral    SpO2: 98% 98%  100% 100%  Weight:      Height:        Intake/Output Summary (Last 24 hours) at 02/21/2021 1540 Last data filed at 02/20/2021 1600 Gross per 24 hour  Intake --  Output 700 ml  Net -700 ml   Filed Weights   02/17/21 0342 02/19/21 0500 02/20/21 0400  Weight: 110.6 kg 99.1 kg 103 kg    Exam:  . General: 80 y.o. year-old male obese in no acute distress.  He is somnolent but arousable to  voices.   . Cardiovascular: Regular rate and rhythm no rubs or gallops. [name] Respiratory: Clear to auscultation no wheezes or rales.  Poor inspiratory effort.  . Abdomen: Obese nontender bowel sounds present.   . Musculoskeletal: No lower extremity edema bilaterally. . Skin: No ulcerative lesions noted. [name] Psychiatry: Unable to assess mood due to somnolence.   Data Reviewed: CBC: Recent Labs  Lab 02/15/21 0325 02/15/21 0446 02/16/21 0307 02/16/21 0819 02/17/21 0400 02/18/21 0237 02/19/21 0126  WBC 11.8*  --  11.1*  --  12.9* 12.5* 12.4*  HGB 10.3*   < > 9.2* 9.2* 9.5* 9.0* 9.9*  HCT 31.0*   < > 28.9* 27.0* 30.0* 27.2* 30.3*  MCV 94.5  --  96.3  --  96.5 95.8 93.8  PLT 296  --  240  --  289 310 356   < > = values in this interval not displayed.   Basic Metabolic Panel: Recent Labs  Lab 02/15/21 0150 02/15/21 0325 02/16/21 0307 02/16/21 0819 02/16/21 0831 02/17/21 0400 02/18/21 0237 02/19/21 0126 02/19/21 0518 02/21/21 0616  NA 131*   < > 135 135  --  136 140 141 142  --   K 3.6   < > 3.8 3.6  --  3.0* 4.2 3.1* 3.4* 4.3  CL 94*   < > 100  --   --  96* 104 101 102  --   CO2 30   < > 29  --   --  _0 --   GLUCOSE 125*   < > 150*  --   --  137* 129* 123* 126*  --   BUN 14   < > 16  --   --  22 26* 23 23  --   CREATININE 0.88   < > 0.59*  --   --  0.77 0.69 0.76 0.77 0.75  CALCIUM 9.1   < > 8.7*  --   --  8.8* 9.0 9.4 9.3  --   MG 1.9  --   --   --  1.8  --  2.0 2.2  --   --   PHOS 3.0  --   --   --   --   --   --   --   --   --    < > = values in this interval not displayed.   GFR: Estimated Creatinine Clearance: 82.8 mL/min (by C-G formula based on SCr of 0.75 mg/dL). Liver Function Tests: No results for input(s): AST, ALT, ALKPHOS, BILITOT, PROT, ALBUMIN in the last 168 hours. No results for input(s): LIPASE, AMYLASE in the last 168 hours. No results for input(s): AMMONIA in the last 168 hours. Coagulation Profile: No results for input(s): INR, PROTIME in  the last 168 hours. Cardiac Enzymes: No results for input(s): CKTOTAL, CKMB, CKMBINDEX, TROPONINI in the last 168 hours. BNP (last 3 results) No results for input(s): PROBNP in the last 8760 hours. HbA1C: No  results for input(s): HGBA1C in the last 72 hours. CBG: Recent Labs  Lab 02/18/21 2323 02/19/21 0317 02/19/21 0805 02/19/21 1126 02/19/21 1537  GLUCAP 130* 113* 112* 130* 136*   Lipid Profile: No results for input(s): CHOL, HDL, LDLCALC, TRIG, CHOLHDL, LDLDIRECT in the last 72 hours. Thyroid Function Tests: No results for input(s): TSH, T4TOTAL, FREET4, T3FREE, THYROIDAB in the last 72 hours. Anemia Panel: No results for input(s): VITAMINB12, FOLATE, FERRITIN, TIBC, IRON, RETICCTPCT in the last 72 hours. Urine analysis:    Component Value Date/Time   COLORURINE AMBER (A) 02/14/2021 1220   APPEARANCEUR CLOUDY (A) 02/14/2021 1220   LABSPEC 1.021 02/14/2021 1220   PHURINE 5.0 02/14/2021 1220   GLUCOSEU NEGATIVE 02/14/2021 1220   HGBUR NEGATIVE 02/14/2021 1220   BILIRUBINUR NEGATIVE 02/14/2021 1220   KETONESUR 5 (A) 02/14/2021 1220   PROTEINUR 100 (A) 02/14/2021 1220   NITRITE NEGATIVE 02/14/2021 1220   LEUKOCYTESUR SMALL (A) 02/14/2021 1220   Sepsis Labs: _0 (procalcitonin:4,lacticidven:4)  ) Recent Results (from the past 240 hour(s))  SARS CORONAVIRUS 2 (TAT 6-24 HRS) Nasopharyngeal Nasopharyngeal Swab     Status: None   Collection Time: 02/14/21  6:30 AM   Specimen: Nasopharyngeal Swab  Result Value Ref Range Status   SARS Coronavirus 2 NEGATIVE NEGATIVE Final    Comment: (NOTE) SARS-CoV-2 target nucleic acids are NOT DETECTED.  The SARS-CoV-2 RNA is generally detectable in upper and lower respiratory specimens during the acute phase of infection. Negative results do not preclude SARS-CoV-2 infection, do not rule out co-infections with other pathogens, and should not be used as the sole basis for treatment or other patient management  decisions. Negative results must be combined with clinical observations, patient history, and epidemiological information. The expected result is Negative.  Fact Sheet for Patients: SugarRoll.be  Fact Sheet for Healthcare Providers: https://www.woods-mathews.com/  This test is not yet approved or cleared by the [place] FDA and  has been authorized for detection and/or diagnosis of SARS-CoV-2 by FDA under an Emergency Use Authorization (EUA). This EUA will remain  in effect (meaning this test can be used) for the duration of the COVID-19 declaration under Se ction 564(b)(1) of the Act, 21 U.S.C. section 360bbb-3(b)(1), unless the authorization is terminated or revoked sooner.  Performed at [facility], [place] [address] [place], [place] [address]   Urine culture     Status: Abnormal   Collection Time: 02/14/21  9:36 AM   Specimen: Urine, Clean Catch  Result Value Ref Range Status   Specimen Description URINE, CLEAN CATCH  Final   Special Requests   Final    NONE Performed at [facility], [place] [address] [place], [place] [address]    Culture MULTIPLE SPECIES PRESENT, SUGGEST RECOLLECTION (A)  Final   Report Status 02/16/2021 FINAL  Final  Culture, blood (routine x 2)     Status: None   Collection Time: 02/14/21 10:56 AM   Specimen: BLOOD RIGHT HAND  Result Value Ref Range Status   Specimen Description BLOOD RIGHT HAND  Final   Special Requests   Final    BOTTLES DRAWN AEROBIC ONLY Blood Culture results may not be optimal due to an inadequate volume of blood received in culture bottles   Culture   Final    NO GROWTH 5 DAYS Performed at [facility], [place] [address], [place], [place] [address]    Report Status 02/19/2021 FINAL  Final  MRSA PCR Screening     Status: None  Collection Time: 02/14/21 12:41 PM   Specimen: Nasal Mucosa; Nasopharyngeal  Result Value Ref Range Status   MRSA by PCR NEGATIVE NEGATIVE  Final    Comment:        The GeneXpert MRSA Assay (FDA approved for NASAL specimens only), is one component of a comprehensive MRSA colonization surveillance program. It is not intended to diagnose MRSA infection nor to guide or monitor treatment for MRSA infections. Performed at [facility], [place] [address], [place], [place] [address]   Culture, blood (routine x 2)     Status: None   Collection Time: 02/15/21  1:50 AM   Specimen: BLOOD  Result Value Ref Range Status   Specimen Description BLOOD RIGHT ANTECUBITAL  Final   Special Requests   Final    BOTTLES DRAWN AEROBIC ONLY Blood Culture results may not be optimal due to an excessive volume of blood received in culture bottles   Culture   Final    NO GROWTH 5 DAYS Performed at [facility], [place] [address], [place], [place] [address]    Report Status 02/20/2021 FINAL  Final  Culture, blood (routine x 2)     Status: None   Collection Time: 02/15/21  2:03 AM   Specimen: BLOOD  Result Value Ref Range Status   Specimen Description BLOOD RIGHT ANTECUBITAL  Final   Special Requests   Final    BOTTLES DRAWN AEROBIC ONLY Blood Culture results may not be optimal due to an excessive volume of blood received in culture bottles   Culture   Final    NO GROWTH 5 DAYS Performed at [facility], [place] [address] [place],  [address]    Report Status 02/20/2021 FINAL  Final      Studies: No results found.  Scheduled Meds: . amLODipine  10 mg Oral Daily  . chlorhexidine gluconate (MEDLINE KIT)  15 mL Mouth Rinse BID  . Chlorhexidine Gluconate Cloth  6 each Topical Daily  . [START ON 02/22/2021] enoxaparin (LOVENOX) injection  50 mg Subcutaneous Q24H  . ipratropium-albuterol  3 mL Nebulization TID  . multivitamin with minerals  1 tablet Oral Daily  . pantoprazole  40 mg Oral Daily  . polyethylene glycol  17 g Oral BID  . Ensure Max Protein  11 oz Oral BID  . senna-docusate  1 tablet Oral BID  . sodium chloride  flush  10-40 mL Intracatheter Q12H    Continuous Infusions: . sodium chloride Stopped (02/15/21 1055)     LOS: 7 days     Kayleen Memos, MD [facility] Pager [phone]  If 7PM-7AM, please contact night-coverage [email or web address] Password [facility] 02/21/2021, 3:40 PM

## 2021-02-21 NOTE — Evaluation (Signed)
Physical Therapy Evaluation Patient Details Name: Jorge Sherman MRN: [id number] DOB: 04-10-1941 Today's Date: 02/21/2021   History of Present Illness  Pt adm 3/26 from SND with acute hypoxic respiratory failure with likely aspiration PNA after vomiting. Bilious vomiting likely due to chronic opiate use due to chronic pain. Narcan given at facility. Pt intubated 3/27-3/31. PMH - chronic pain, multiple back surgeries, HTN, DM, depression.  Clinical Impression  Pt admitted with above diagnosis and presents to PT with functional limitations due to deficits listed below (See PT problem list). Pt needs skilled PT to maximize independence and safety to allow discharge back to SNF.      Follow Up Recommendations SNF    Equipment Recommendations  Hospital bed;Other (comment) (hoyer lift)    Recommendations for Other Services       Precautions / Restrictions Precautions Precautions: Fall      Mobility  Bed Mobility Overal bed mobility: Needs Assistance Bed Mobility: Supine to Sit;Sit to Supine;Rolling Rolling: +2 for physical assistance;Max assist   Supine to sit: +2 for physical assistance;Total assist;HOB elevated Sit to supine: +2 for physical assistance;Total assist   General bed mobility comments: Assist for all aspects    Transfers                 General transfer comment: Unable to attempt  Ambulation/Gait                Stairs            Wheelchair Mobility    Modified Rankin (Stroke Patients Only)       Balance Overall balance assessment: Needs assistance Sitting-balance support: Bilateral upper extremity supported;Feet supported Sitting balance-Leahy Scale: Poor Sitting balance - Comments: UE support and min guard. Sat for 8 minutes before returning to supine for bed pan                                     Pertinent Vitals/Pain Pain Assessment: No/denies pain (Yelling out intermittently but denied pain)    Home Living  Family/patient expects to be discharged to:: Skilled nursing facility                      Prior Function Level of Independence: Needs assistance (Simultaneous filing. User may not have seen previous data.)   Gait / Transfers Assistance Needed: Pt has been nonambulatory for quite some time. Prior to hospitalizations beginning in Dec 2021 he was performing scooting transfer to electric scooter. Pt was unable to tell me what he was doing mobility wise at SNF currently.           Hand Dominance   Dominant Hand: Right    Extremity/Trunk Assessment   Upper Extremity Assessment Upper Extremity Assessment: Defer to OT evaluation    Lower Extremity Assessment Lower Extremity Assessment: RLE deficits/detail;LLE deficits/detail RLE Deficits / Details: grossly <3/5 LLE Deficits / Details: grossly <3/5       Communication   Communication: HOH  Cognition Arousal/Alertness: Awake/alert (Simultaneous filing. User may not have seen previous data.) Behavior During Therapy: [facility] for tasks assessed/performed (Simultaneous filing. User may not have seen previous data.) Overall Cognitive Status: No family/caregiver present to determine baseline cognitive functioning                                 General Comments: Oriented  to place and time. Follows 1 step commands with incr time. (Simultaneous filing. User may not have seen previous data.)      General Comments      Exercises     Assessment/Plan    PT Assessment Patient needs continued PT services  PT Problem List Decreased strength;Decreased activity tolerance;Decreased balance;Decreased mobility;Obesity       PT Treatment Interventions DME instruction;Functional mobility training;Therapeutic activities;Therapeutic exercise;Balance training;Patient/family education    PT Goals (Current goals can be found in the Care Plan section)  Acute Rehab PT Goals Patient Stated Goal: Not stated PT Goal Formulation:  With patient Time For Goal Achievement: 03/07/21 Potential to Achieve Goals: Fair    Frequency Min 2X/week   Barriers to discharge        Co-evaluation               AM-PAC PT "6 Clicks" Mobility  Outcome Measure Help needed turning from your back to your side while in a flat bed without using bedrails?: Total Help needed moving from lying on your back to sitting on the side of a flat bed without using bedrails?: Total Help needed moving to and from a bed to a chair (including a wheelchair)?: Total Help needed standing up from a chair using your arms (e.g., wheelchair or bedside chair)?: Total Help needed to walk in hospital room?: Total Help needed climbing 3-5 steps with a railing? : Total 6 Click Score: 6    End of Session Equipment Utilized During Treatment: Oxygen Activity Tolerance: Patient limited by fatigue Patient left: in bed;with call bell/phone within reach;with bed alarm set;Other (comment) (on bedpan) Nurse Communication: Other (comment) (Pt on bedpan) PT Visit Diagnosis: Other abnormalities of gait and mobility (R26.89);Muscle weakness (generalized) (M62.81);History of falling (Z91.81)    Time: 1740-8144 PT Time Calculation (min) (ACUTE ONLY): 14 min   Charges:   PT Evaluation $PT Eval Moderate Complexity: [place] Pager [phone] Office [place] 02/21/2021, 1:00 PM

## 2021-02-22 DIAGNOSIS — J9602 Acute respiratory failure with hypercapnia: Secondary | ICD-10-CM | POA: Diagnosis not present

## 2021-02-22 LAB — COMPREHENSIVE METABOLIC PANEL
ALT: 64 U/L — ABNORMAL HIGH (ref 0–44)
AST: 32 U/L (ref 15–41)
Albumin: 2.7 g/dL — ABNORMAL LOW (ref 3.5–5.0)
Alkaline Phosphatase: 60 U/L (ref 38–126)
Anion gap: 8 (ref 5–15)
BUN: 15 mg/dL (ref 8–23)
CO2: 29 mmol/L (ref 22–32)
Calcium: 9.3 mg/dL (ref 8.9–10.3)
Chloride: 101 mmol/L (ref 98–111)
Creatinine, Ser: 0.7 mg/dL (ref 0.61–1.24)
GFR, Estimated: >60 mL/min (ref >60)
Glucose, Bld: 128 mg/dL — ABNORMAL HIGH (ref 70–99)
Potassium: 3.1 mmol/L — ABNORMAL LOW (ref 3.5–5.1)
Sodium: 138 mmol/L (ref 135–145)
Total Bilirubin: 0.5 mg/dL (ref 0.3–1.2)
Total Protein: 5.9 g/dL — ABNORMAL LOW (ref 6.5–8.1)

## 2021-02-22 LAB — PROCALCITONIN: Procalcitonin: <0.1 ng/mL

## 2021-02-22 LAB — SARS CORONAVIRUS 2 (TAT 6-24 HRS): SARS Coronavirus 2: NEGATIVE

## 2021-02-22 MED ORDER — ALBUTEROL SULFATE HFA 108 (90 BASE) MCG/ACT IN AERS
2.0000 | INHALATION_SPRAY | Freq: Four times a day (QID) | RESPIRATORY_TRACT | Status: DC | PRN
  Filled 2021-02-22: qty 6.7

## 2021-02-22 MED ORDER — POTASSIUM CHLORIDE CRYS ER 20 MEQ PO TBCR: 40.0000 meq | EXTENDED_RELEASE_TABLET | Freq: Once | ORAL | Status: DC

## 2021-02-22 MED ORDER — POTASSIUM CHLORIDE CRYS ER 20 MEQ PO TBCR
40.0000 meq | EXTENDED_RELEASE_TABLET | Freq: Two times a day (BID) | ORAL | Status: AC
  Administered 2021-02-22 – 2021-02-23 (×2): 40 meq via ORAL
  Filled 2021-02-22 (×2): qty 2

## 2021-02-22 MED ORDER — IPRATROPIUM-ALBUTEROL 0.5-2.5 (3) MG/3ML IN SOLN
3.0000 mL | Freq: Four times a day (QID) | RESPIRATORY_TRACT | Status: DC | PRN
Start: 2021-02-22 — End: 2021-02-24

## 2021-02-22 NOTE — NC FL2 (Signed)
[place] LEVEL OF CARE SCREENING TOOL     IDENTIFICATION  Patient Name: Jorge Sherman Birthdate: 05-18-1941 Sex: male Admission Date (Current Location): 02/13/2021  [facility] and [place] Number:  Herbalist and Address:  [facility], [place] [address] [place], [place] [address]      Provider Number: [id number]  Attending Physician Name and Address:  Kayleen Memos, DO  Relative Name and Phone Number:  Adis, Sturgill (Daughter)   [phone] [facility] Phone)    Current Level of Care: Hospital Recommended Level of Care: [place] Prior Approval Number:    Date Approved/Denied:   PASRR Number: [id number]  Discharge Plan: SNF    Current Diagnoses: Patient Active Problem List   Diagnosis Date Noted  . Acute respiratory failure with hypercapnia ([place]) 02/14/2021  . Bilious emesis 02/14/2021  . Acute hypercapnic respiratory failure ([place]) 02/14/2021  . Hyponatremia 11/11/2020  . Falls 10/30/2020  . Dehydration with hyponatremia 10/29/2020  . Fall at home, initial encounter 10/29/2020  . Musculoskeletal chest pain 10/29/2020  . Mixed hyperlipidemia due to type 2 diabetes mellitus ([place]) 10/29/2020  . Benign prostatic hyperplasia without lower urinary tract symptoms 10/29/2020  . Ileus ([place])   . Declining functional status   . Goals of care, counseling/discussion   . Palliative care by specialist   . AKI (acute kidney injury) ([place]) 06/27/2020  . Acute diarrhea 06/27/2020  . Altered mental status 08/28/2019  . Septic joint of left knee joint ([place]) 04/22/2019  . Erectile dysfunction 10/30/2013  . Essential hypertension, benign 10/30/2013  . Coronary artery disease involving native coronary artery of native heart without angina pectoris 10/30/2013  . Dizziness and giddiness   . Pain in joint, lower leg   . Type 2 diabetes mellitus without complication, with long-term current use of insulin ([place])   . Obstructive sleep  apnea   . Depressive disorder, not elsewhere classified   . Essential and other specified forms of tremor   . Headache(784.0)   . Spinal stenosis, lumbar region, without neurogenic claudication   . Thoracic or lumbosacral neuritis or radiculitis, unspecified   . Chronic pain syndrome     Orientation RESPIRATION BLADDER Height & Weight     Self,Time,Situation,Place  Normal Incontinent,External catheter Weight: 227 lb 1.2 oz (103 kg) Height:  5' 6"  (167.6 cm)  BEHAVIORAL SYMPTOMS/MOOD NEUROLOGICAL BOWEL NUTRITION STATUS      Continent Diet (see d/c summary)  AMBULATORY STATUS COMMUNICATION OF NEEDS Skin   Extensive Assist Verbally Normal                       Personal Care Assistance Level of Assistance  Bathing,Feeding,Dressing Bathing Assistance: Maximum assistance Feeding assistance: Independent Dressing Assistance: Limited assistance     Functional Limitations Info  Sight,Speech,Hearing Sight Info: Adequate Hearing Info: Adequate Speech Info: Adequate    SPECIAL CARE FACTORS FREQUENCY  PT (By licensed PT),OT (By licensed OT)     PT Frequency: 5x/week OT Frequency: 5x/week            Contractures Contractures Info: Not present    Additional Factors Info  Code Status,Allergies Code Status Info: Full Code Allergies Info: Cefepime, fentanyl, Iohexol, iodine           Current Medications (02/22/2021):  This is the current hospital active medication list Current Facility-Administered Medications  Medication Dose Route Frequency Provider Last Rate Last Admin  . 0.9 %  sodium chloride infusion   Intravenous  PRN Julian Hy, DO   Stopped at 02/15/21 1055  . acetaminophen (TYLENOL) tablet 650 mg  650 mg Oral Q6H PRN Judd Lien, MD   650 mg at 02/21/21 1751  . albuterol (VENTOLIN HFA) 108 (90 Base) MCG/ACT inhaler 2 puff  2 puff Inhalation Q6H PRN Irene Pap N, DO      . amLODipine (NORVASC) tablet 10 mg  10 mg Oral Daily Bowser, Laurel Dimmer, NP   10 mg  at 02/22/21 0931  . chlorhexidine gluconate (MEDLINE KIT) (PERIDEX) 0.12 % solution 15 mL  15 mL Mouth Rinse BID Freddi Starr, MD   15 mL at 02/21/21 2214  . Chlorhexidine Gluconate Cloth 2 % PADS 6 each  6 each Topical Daily Freddi Starr, MD   6 each at 02/22/21 [phone]  . DULoxetine (CYMBALTA) DR capsule 30 mg  30 mg Oral BID Irene Pap N, DO   30 mg at 02/22/21 0931  . enoxaparin (LOVENOX) injection 50 mg  50 mg Subcutaneous Q24H Esmond Plants, RPH   50 mg at 02/22/21 0931  . folic acid (FOLVITE) tablet 1 mg  1 mg Oral Daily [place], Archie Patten N, DO   1 mg at 02/22/21 0931  . gabapentin (NEURONTIN) capsule 100 mg  100 mg Oral TID Irene Pap N, DO   100 mg at 02/22/21 0931  . ipratropium-albuterol (DUONEB) 0.5-2.5 (3) MG/3ML nebulizer solution 3 mL  3 mL Nebulization Q6H PRN Irene Pap N, DO      . LORazepam (ATIVAN) tablet 1-4 mg  1-4 mg Oral Q1H PRN Kayleen Memos, DO       Or  . LORazepam (ATIVAN) injection 1-4 mg  1-4 mg Intravenous Q1H PRN Irene Pap N, DO      . LORazepam (ATIVAN) tablet 0.5 mg  0.5 mg Oral Q8H PRN Irene Pap N, DO   0.5 mg at 02/22/21 0057  . multivitamin with minerals tablet 1 tablet  1 tablet Oral Daily Candee Furbish, MD   1 tablet at 02/22/21 0931  . naphazoline-glycerin (CLEAR EYES REDNESS) ophth solution 1-2 drop  1-2 drop Both Eyes QID PRN Noemi Chapel P, DO   1 drop at 02/20/21 0018  . ondansetron (ZOFRAN) injection 4 mg  4 mg Intravenous Q6H PRN Icard, Bradley L, DO   4 mg at 02/19/21 2209  . pantoprazole (PROTONIX) EC tablet 40 mg  40 mg Oral Daily Candee Furbish, MD   40 mg at 02/22/21 0931  . polyethylene glycol (MIRALAX / GLYCOLAX) packet 17 g  17 g Per Tube Daily PRN Icard, Bradley L, DO      . polyethylene glycol (MIRALAX / GLYCOLAX) packet 17 g  17 g Oral BID Candee Furbish, MD   17 g at 02/22/21 0932  . potassium chloride SA (KLOR-CON) CR tablet 40 mEq  40 mEq Oral Once Irene Pap N, DO      . protein supplement (ENSURE MAX) liquid  11  oz Oral BID Candee Furbish, MD   11 oz at 02/22/21 0933  . senna-docusate (Senokot-S) tablet 1 tablet  1 tablet Oral BID Candee Furbish, MD   1 tablet at 02/22/21 0931  . sodium chloride flush (NS) 0.9 % injection 10-40 mL  10-40 mL Intracatheter Q12H Icard, Bradley L, DO   10 mL at 02/22/21 0933  . sodium chloride flush (NS) 0.9 % injection 10-40 mL  10-40 mL Intracatheter PRN Icard, Bradley L, DO      .  thiamine tablet 100 mg  100 mg Oral Daily Irene Pap N, DO   100 mg at 02/22/21 2258   Or  . thiamine (B-1) injection 100 mg  100 mg Intravenous Daily Kayleen Memos, DO         Discharge Medications: Please see discharge summary for a list of discharge medications.  Relevant Imaging Results:  Relevant Lab Results:   Additional Information SS#  [id number]  [facility], LCSW

## 2021-02-22 NOTE — Progress Notes (Signed)
  Speech Language Pathology Treatment: Dysphagia  Patient Details Name: Jorge Sherman MRN: [id number] DOB: Jul 09, 1941 Today's Date: 02/22/2021 Time: 8469-6295 SLP Time Calculation (min) (ACUTE ONLY): 22 min  Assessment / Plan / Recommendation Clinical Impression  No overt s/s of aspiration were noted except for one delayed cough following all PO trials. Pt was challenged with consecutive sips of water and even consumed mixed consistencies which did not elicit any immediate coughing. Pt is edentulous but he was still able to fully masticate a graham cracker and reports that he normally ate regular solids PTA. He also reports no subjective difficulty during mealtimes currently. He is afebrile with clear and diminished breath sounds and no longer requiring supplemental O2. Recommend a regular diet and thin liquids. SLP will sign off at this time.    HPI HPI: Jorge Sherman is an 80 y/o gentleman with a history of chronic pain and opiate use, multiple previous back surgeries who was admitted with respiratory failure partially responsive to narcan. He vomited and aspirated on BiPAP while being treated with a narcan gtt, requiring intubation 3/27-3/31      SLP Plan  All goals met       Recommendations  Diet recommendations: Regular;Thin liquid Liquids provided via: Cup;Straw Medication Administration: Whole meds with liquid Supervision: Patient able to self feed;Intermittent supervision to cue for compensatory strategies Compensations: Slow rate;Small sips/bites Postural Changes and/or Swallow Maneuvers: Seated upright 90 degrees                Oral Care Recommendations: Oral care BID Follow up Recommendations: Skilled Nursing facility SLP Visit Diagnosis: Dysphagia, oropharyngeal phase (R13.12) Plan: All goals met       GO                Jeanine Luz., SLP Student 02/22/2021, 3:37 PM

## 2021-02-22 NOTE — Progress Notes (Signed)
Patient refused CPAP for the night  

## 2021-02-22 NOTE — TOC Initial Note (Addendum)
Transition of Care [facility]) - Initial/Assessment Note    Patient Details  Name: Jorge Sherman MRN: [id number] Date of Birth: [birth year]  [facility]) CM/SW Contact:    Bethann Berkshire, [place] Phone Number: 02/22/2021, 11:50 AM  Clinical Narrative:                  CSW met with pt bedside to discuss d/c. Pt states he does not want to return to any facility. He states that he knows he can't go home but does not want a facility. Pt requests bathroom at this time; CSW Ship broker. CSW received permission to discuss with pt's daughter.   CSW called pt's daughter. She explained that she would like to find a different facility stating that pt was given an overdose of suboxone while at [facility], though this was under a pain clinic's MD's orders. She still would like to go to a different facility. CSW brought phone on speaker to discuss with pt and his daughter. Pt states he does not want to go back to [place] and prefers another facility. Pt is not vaccinated. Daughter confirms he has medicaid pending.   CSW called Juliann Pulse with [employer]; she will review pt.   1332: [facility] can accept pt. CSW started [place], [id number]. Clinicals faxed. Covid test requested  Expected Discharge Plan: Skilled Nursing Facility Barriers to Discharge: Continued Medical Work up   Patient Goals and CMS Choice Patient states their goals for this hospitalization and ongoing recovery are:: Wants different facility than [facility]   Choice offered to / list presented to : Adult Children  Expected Discharge Plan and Services Expected Discharge Plan: [place] arrangements for the past 2 months: [place]                                      Prior Living Arrangements/Services Living arrangements for the past 2 months: [place] Lives with:: Facility Resident Patient language and need for interpreter reviewed:: Yes         Need for Family Participation in Patient Care: Yes (Comment) Care giver support system in place?: No (comment)   Criminal Activity/Legal Involvement Pertinent to Current Situation/Hospitalization: No - Comment as needed  Activities of Daily Living      Permission Sought/Granted   Permission granted to share information with : Yes, Verbal Permission Granted  Share Information with NAME: Jaimes, Eckert (Daughter)   [phone] (Home Phone)           Emotional Assessment Appearance:: Appears stated age Attitude/Demeanor/Rapport: Lethargic Affect (typically observed): Flat Orientation: : Oriented to Self,Oriented to Place,Oriented to Situation Alcohol / Substance Use: Not Applicable Psych Involvement: No (comment)  Admission diagnosis:  SOB (shortness of breath) [R06.02] Acute respiratory failure with hypoxia and hypercarbia (HCC) [J96.01, J96.02] Acute hypercapnic respiratory failure (HCC) [J96.02] Patient Active Problem List   Diagnosis Date Noted  . Acute respiratory failure with hypercapnia ([place]) 02/14/2021  . Bilious emesis 02/14/2021  . Acute hypercapnic respiratory failure ([place]) 02/14/2021  . Hyponatremia 11/11/2020  . Falls 10/30/2020  . Dehydration with hyponatremia 10/29/2020  . Fall at home, initial encounter 10/29/2020  . Musculoskeletal chest pain 10/29/2020  . Mixed hyperlipidemia due to type 2 diabetes mellitus ([place]) 10/29/2020  . Benign prostatic hyperplasia without lower urinary tract symptoms 10/29/2020  . Ileus ([place])   . Declining functional status   .  Goals of care, counseling/discussion   . Palliative care by specialist   . AKI (acute kidney injury) ([place]) 06/27/2020  . Acute diarrhea 06/27/2020  . Altered mental status 08/28/2019  . Septic joint of left knee joint ([place]) 04/22/2019  . Erectile dysfunction 10/30/2013  . Essential hypertension, benign 10/30/2013  . Coronary artery disease involving native coronary artery of native heart without angina  pectoris 10/30/2013  . Dizziness and giddiness   . Pain in joint, lower leg   . Type 2 diabetes mellitus without complication, with long-term current use of insulin ([place])   . Obstructive sleep apnea   . Depressive disorder, not elsewhere classified   . Essential and other specified forms of tremor   . Headache(784.0)   . Spinal stenosis, lumbar region, without neurogenic claudication   . Thoracic or lumbosacral neuritis or radiculitis, unspecified   . Chronic pain syndrome    PCP:  Merrilee Seashore, MD Pharmacy:   [facility] [address] [place] [address] Phone: [phone] Fax: [phone]     Social Determinants of Health (SDOH) Interventions    Readmission Risk Interventions Readmission Risk Prevention Plan 11/17/2020 06/29/2020  Transportation Screening Complete Complete  Home Care Screening - Complete  Medication Review (RN CM) - Complete  Medication Review ([place]) Complete -  PCP or Specialist appointment within 3-5 days of discharge Complete -  SW Recovery Care/Counseling Consult Complete -  [place] Complete -  Some recent data might be hidden

## 2021-02-22 NOTE — Progress Notes (Signed)
PROGRESS NOTE  Jorge Sherman ERX:[id number] DOB: 18-Apr-1941 DOA: 02/13/2021 PCP: Merrilee Seashore, MD  HPI/Recap of past 24 hours: This is an 80 year old gentleman with a past medical history of hypertension, chronic pain, chronic depression. He was found on the morning of his presentation at SNF with acute hypoxemia with O2 saturation in the 80s.  He was transferred to the emergency department. He was given Narcan x2 he responded briefly. He has been a DNR in the past however discussions with family led to reversal of his CODE STATUS to full code. Patient was placed on a Narcan drip. He was found to be hypercarbic with an elevated PCO2.  He started vomiting while on BiPAP. There was definite concern for aspiration.  Unable to continue BiPAP therapy, decision was made for endotracheal intubation.  Pulmonary critical care was consulted for recommendations and management and admission to the intensive care.  [facility] assumed care on 02/20/21.  Hospital course complicated by waxing and waning mentation status.  CT head was unremarkable for any acute intracranial findings on 02/20/21.  He has completed the course of his antibiotics for aspiration pneumonia.  02/22/21: Patient was seen and examined at his bedside.  There were no acute events overnight.  He denies chest pain, dyspnea or palpitations.  No GI symptoms.  He has no new complaints.  Assessment/Plan: Principal Problem:   Acute respiratory failure with hypercapnia (HCC) Active Problems:   Chronic pain syndrome   Bilious emesis   Acute hypercapnic respiratory failure (HCC)  Resolved aspiration pneumonia and pneumonitis. Completed 5 days of Unasyn. O2 saturation 100% on room air.  Resolved acute hypoxic respiratory failure secondary to above Currently O2 saturation 100% on room air Home O2 evaluation prior to DC.  Resolved slurring speech/delirium CT head was unremarkable for any acute intracranial findings.   Physical exam was  non focal. Mentation is currently back to his baseline.  Elevated liver chemistries, unclear etiology Appears to be acute Elevated AST, ALT Avoid hepatotoxic agents. Hold off Tylenol for now  Refractory hypokalemia Serum potassium 3.0 Serum magnesium 2.1 Repleted, repeat  Dysphagia Speech assessment recommended dysphagia 3 (mechanical soft), thin liquid diet Continue aspiration precautions  Chronic pain syndrome At home he was on Suboxone, oxycodone, gabapentin Home gabapentin has been restarted.  Chronic anxiety/depression Continue home duloxetine  OSA CPAP at night  Chronic normocytic anemia Hemoglobin 9.7 from 9.9. No overt bleeding.  Essential hypertension BP is currently at goal. Continue home Norvasc 10 mg daily. Continue to monitor vital signs.  Physical debility/ambulatory dysfunction. PT assessed and recommending SNF TOC assisting with SNF placement.  Appreciate assistance. Continue fall precautions.  Code Status: Full code  Family Communication: None at bedside  Disposition Plan: Likely will discharge to SNF once a bed is available.   Consultants:  PCCM  Procedures:  Intubation  Extubation  Antimicrobials:  Completed 5 days of Unasyn.  DVT prophylaxis: Subcu enoxaparin daily  Status is: Inpatient    Dispo: The patient is from: SNF              Anticipated d/c is to: SNF once a bed is available.  Patient does not wish to return to previous SNF.              Patient currently is stable for discharge to SNF.   Difficult to place patient: Not applicable.        Objective: Vitals:   02/21/21 2328 02/22/21 0519 02/22/21 0744 02/22/21 0843  BP:  137/73 120/83  Pulse: 71 81 81   Resp: 18 18 16    Temp:  98.3 F (36.8 C) 98.2 F (36.8 C)   TempSrc:  Oral Oral   SpO2: 100% 98% 100% 100%  Weight:      Height:        Intake/Output Summary (Last 24 hours) at 02/22/2021 1312 Last data filed at 02/21/2021 1900 Gross per 24 hour   Intake 200 ml  Output --  Net 200 ml   Filed Weights   02/17/21 0342 02/19/21 0500 02/20/21 0400  Weight: 110.6 kg 99.1 kg 103 kg    Exam:  . General: 80 y.o. year-old male obese in no acute distress.  He is alert and oriented x3.   . Cardiovascular: Regular rate and rhythm no rubs or gallops.   [name] Respiratory: Clear to auscultation no wheezes or rales.  Good inspiratory effort. . Abdomen: Obese nontender normal bowel sounds.   . Musculoskeletal: No lower extremity edema bilaterally.. . Skin: No ulcerative lesions noted.   [name] Psychiatry: Mood is appropriate for condition and setting.   Data Reviewed: CBC: Recent Labs  Lab 02/16/21 0307 02/16/21 0819 02/17/21 0400 02/18/21 0237 02/19/21 0126 02/21/21 1801  WBC 11.1*  --  12.9* 12.5* 12.4* 12.5*  HGB 9.2* 9.2* 9.5* 9.0* 9.9* 9.7*  HCT 28.9* 27.0* 30.0* 27.2* 30.3* 30.2*  MCV 96.3  --  96.5 95.8 93.8 94.7  PLT 240  --  289 310 356 951*   Basic Metabolic Panel: Recent Labs  Lab 02/16/21 0831 02/17/21 0400 02/18/21 0237 02/19/21 0126 02/19/21 0518 02/21/21 0616 02/21/21 1801  NA  --  136 140 141 142  --  138  K  --  3.0* 4.2 3.1* 3.4* 4.3 3.0*  CL  --  96* 104 101 102  --  100  CO2  --  30 28 30 31   --  29  GLUCOSE  --  137* 129* 123* 126*  --  139*  BUN  --  22 26* 23 23  --  12  CREATININE  --  0.77 0.69 0.76 0.77 0.75 0.70  CALCIUM  --  8.8* 9.0 9.4 9.3  --  9.5  MG 1.8  --  2.0 2.2  --   --  2.1  PHOS  --   --   --   --   --   --  3.3   GFR: Estimated Creatinine Clearance: 82.8 mL/min (by C-G formula based on SCr of 0.7 mg/dL). Liver Function Tests: Recent Labs  Lab 02/21/21 1801  AST 45*  ALT 77*  ALKPHOS 57  BILITOT 0.6  PROT 5.8*  ALBUMIN 2.8*   No results for input(s): LIPASE, AMYLASE in the last 168 hours. No results for input(s): AMMONIA in the last 168 hours. Coagulation Profile: No results for input(s): INR, PROTIME in the last 168 hours. Cardiac Enzymes: No results for input(s):  CKTOTAL, CKMB, CKMBINDEX, TROPONINI in the last 168 hours. BNP (last 3 results) No results for input(s): PROBNP in the last 8760 hours. HbA1C: No results for input(s): HGBA1C in the last 72 hours. CBG: Recent Labs  Lab 02/18/21 2323 02/19/21 0317 02/19/21 0805 02/19/21 1126 02/19/21 1537  GLUCAP 130* 113* 112* 130* 136*   Lipid Profile: No results for input(s): CHOL, HDL, LDLCALC, TRIG, CHOLHDL, LDLDIRECT in the last 72 hours. Thyroid Function Tests: No results for input(s): TSH, T4TOTAL, FREET4, T3FREE, THYROIDAB in the last 72 hours. Anemia Panel: No results for input(s): VITAMINB12, FOLATE, FERRITIN, TIBC, IRON, RETICCTPCT  in the last 72 hours. Urine analysis:    Component Value Date/Time   COLORURINE AMBER (A) 02/14/2021 1220   APPEARANCEUR CLOUDY (A) 02/14/2021 1220   LABSPEC 1.021 02/14/2021 1220   PHURINE 5.0 02/14/2021 1220   GLUCOSEU NEGATIVE 02/14/2021 1220   HGBUR NEGATIVE 02/14/2021 1220   BILIRUBINUR NEGATIVE 02/14/2021 1220   KETONESUR 5 (A) 02/14/2021 1220   PROTEINUR 100 (A) 02/14/2021 1220   NITRITE NEGATIVE 02/14/2021 1220   LEUKOCYTESUR SMALL (A) 02/14/2021 1220   Sepsis Labs: @LABRCNTIP (procalcitonin:4,lacticidven:4)  ) Recent Results (from the past 240 hour(s))  SARS CORONAVIRUS 2 (TAT 6-24 HRS) Nasopharyngeal Nasopharyngeal Swab     Status: None   Collection Time: 02/14/21  6:30 AM   Specimen: Nasopharyngeal Swab  Result Value Ref Range Status   SARS Coronavirus 2 NEGATIVE NEGATIVE Final    Comment: (NOTE) SARS-CoV-2 target nucleic acids are NOT DETECTED.  The SARS-CoV-2 RNA is generally detectable in upper and lower respiratory specimens during the acute phase of infection. Negative results do not preclude SARS-CoV-2 infection, do not rule out co-infections with other pathogens, and should not be used as the sole basis for treatment or other patient management decisions. Negative results must be combined with clinical observations, patient  history, and epidemiological information. The expected result is Negative.  Fact Sheet for Patients: SugarRoll.be  Fact Sheet for Healthcare Providers: https://www.woods-mathews.com/  This test is not yet approved or cleared by the [place] FDA and  has been authorized for detection and/or diagnosis of SARS-CoV-2 by FDA under an Emergency Use Authorization (EUA). This EUA will remain  in effect (meaning this test can be used) for the duration of the COVID-19 declaration under Se ction 564(b)(1) of the Act, 21 U.S.C. section 360bbb-3(b)(1), unless the authorization is terminated or revoked sooner.  Performed at [facility], [place] [address] [place], [place] [address]   Urine culture     Status: Abnormal   Collection Time: 02/14/21  9:36 AM   Specimen: Urine, Clean Catch  Result Value Ref Range Status   Specimen Description URINE, CLEAN CATCH  Final   Special Requests   Final    NONE Performed at [facility], [place] [address], [place], [place] [address]    Culture MULTIPLE SPECIES PRESENT, SUGGEST RECOLLECTION (A)  Final   Report Status 02/16/2021 FINAL  Final  Culture, blood (routine x 2)     Status: None   Collection Time: 02/14/21 10:56 AM   Specimen: BLOOD RIGHT HAND  Result Value Ref Range Status   Specimen Description BLOOD RIGHT HAND  Final   Special Requests   Final    BOTTLES DRAWN AEROBIC ONLY Blood Culture results may not be optimal due to an inadequate volume of blood received in culture bottles   Culture   Final    NO GROWTH 5 DAYS Performed at [facility], [place] [address], [place], [place] [address]    Report Status 02/19/2021 FINAL  Final  MRSA PCR Screening     Status: None   Collection Time: 02/14/21 12:41 PM   Specimen: Nasal Mucosa; Nasopharyngeal  Result Value Ref Range Status   MRSA by PCR NEGATIVE NEGATIVE Final    Comment:        The GeneXpert MRSA Assay (FDA approved for NASAL  specimens only), is one component of a comprehensive MRSA colonization surveillance program. It is not intended to diagnose MRSA infection nor to guide or monitor treatment for MRSA infections. Performed at [facility]  [facility], [place] [address], [place], [place] [address]   Culture, blood (routine x 2)     Status: None   Collection Time: 02/15/21  1:50 AM   Specimen: BLOOD  Result Value Ref Range Status   Specimen Description BLOOD RIGHT ANTECUBITAL  Final   Special Requests   Final    BOTTLES DRAWN AEROBIC ONLY Blood Culture results may not be optimal due to an excessive volume of blood received in culture bottles   Culture   Final    NO GROWTH 5 DAYS Performed at [facility], [place] [address], [place], [place] [address]    Report Status 02/20/2021 FINAL  Final  Culture, blood (routine x 2)     Status: None   Collection Time: 02/15/21  2:03 AM   Specimen: BLOOD  Result Value Ref Range Status   Specimen Description BLOOD RIGHT ANTECUBITAL  Final   Special Requests   Final    BOTTLES DRAWN AEROBIC ONLY Blood Culture results may not be optimal due to an excessive volume of blood received in culture bottles   Culture   Final    NO GROWTH 5 DAYS Performed at [facility], [place] [address] [place], [place] [address]    Report Status 02/20/2021 FINAL  Final      Studies: No results found.  Scheduled Meds: . amLODipine  10 mg Oral Daily  . chlorhexidine gluconate (MEDLINE KIT)  15 mL Mouth Rinse BID  . Chlorhexidine Gluconate Cloth  6 each Topical Daily  . DULoxetine  30 mg Oral BID  . enoxaparin (LOVENOX) injection  50 mg Subcutaneous Q24H  . folic acid  1 mg Oral Daily  . gabapentin  100 mg Oral TID  . multivitamin with minerals  1 tablet Oral Daily  . pantoprazole  40 mg Oral Daily  . polyethylene glycol  17 g Oral BID  . potassium chloride  40 mEq Oral Once  . Ensure Max Protein  11 oz Oral BID  . senna-docusate  1 tablet Oral BID  . sodium chloride flush   10-40 mL Intracatheter Q12H  . thiamine  100 mg Oral Daily   Or  . thiamine  100 mg Intravenous Daily    Continuous Infusions: . sodium chloride Stopped (02/15/21 1055)     LOS: 8 days     Kayleen Memos, MD [facility] Pager [phone]  If 7PM-7AM, please contact night-coverage [email or web address] Password [email or web address] 02/22/2021, 1:12 PM

## 2021-02-23 DIAGNOSIS — G894 Chronic pain syndrome: Secondary | ICD-10-CM | POA: Diagnosis not present

## 2021-02-23 DIAGNOSIS — J690 Pneumonitis due to inhalation of food and vomit: Secondary | ICD-10-CM | POA: Diagnosis not present

## 2021-02-23 DIAGNOSIS — J9602 Acute respiratory failure with hypercapnia: Secondary | ICD-10-CM | POA: Diagnosis not present

## 2021-02-23 MED ORDER — TAMSULOSIN HCL 0.4 MG PO CAPS
0.4000 mg | ORAL_CAPSULE | Freq: Every day | ORAL | Status: DC
  Administered 2021-02-23: 0.4 mg via ORAL
  Filled 2021-02-23: qty 1

## 2021-02-23 MED ORDER — GABAPENTIN 400 MG PO CAPS
700.0000 mg | ORAL_CAPSULE | Freq: Three times a day (TID) | ORAL | Status: DC
  Administered 2021-02-23 (×2): 700 mg via ORAL
  Filled 2021-02-23 (×2): qty 1

## 2021-02-23 MED ORDER — FERROUS SULFATE 325 (65 FE) MG PO TABS
325.0000 mg | ORAL_TABLET | Freq: Every day | ORAL | Status: DC
  Administered 2021-02-23 – 2021-02-24 (×2): 325 mg via ORAL
  Filled 2021-02-23 (×2): qty 1

## 2021-02-23 MED ORDER — LIDOCAINE 5 % EX PTCH
1.0000 | MEDICATED_PATCH | CUTANEOUS | Status: DC
  Administered 2021-02-24: 1 via TRANSDERMAL
  Filled 2021-02-23: qty 1

## 2021-02-23 MED ORDER — CLONIDINE HCL 0.1 MG PO TABS
0.1000 mg | ORAL_TABLET | Freq: Two times a day (BID) | ORAL | Status: DC
  Administered 2021-02-23 – 2021-02-24 (×3): 0.1 mg via ORAL
  Filled 2021-02-23 (×3): qty 1

## 2021-02-23 MED ORDER — CELECOXIB 200 MG PO CAPS
200.0000 mg | ORAL_CAPSULE | Freq: Every day | ORAL | Status: DC
  Administered 2021-02-23 – 2021-02-24 (×2): 200 mg via ORAL
  Filled 2021-02-23 (×2): qty 1

## 2021-02-23 MED ORDER — ATORVASTATIN CALCIUM 80 MG PO TABS
80.0000 mg | ORAL_TABLET | Freq: Every day | ORAL | Status: DC
  Administered 2021-02-23 – 2021-02-24 (×2): 80 mg via ORAL
  Filled 2021-02-23 (×2): qty 1

## 2021-02-23 NOTE — Care Management Important Message (Signed)
Important Message  Patient Details  Name: Jorge Sherman MRN: [id number] Date of Birth: [birth year]   Medicare Important Message Given:  Yes     [name] [name] 02/23/2021, 1:37 PM

## 2021-02-23 NOTE — TOC Progression Note (Addendum)
Transition of Care [facility]) - Progression Note    Patient Details  Name: Jorge Sherman MRN: [id number] Date of Birth: 04-Apr-1941  Transition of Care [facility]) CM/SW [place], [place] Phone Number: 02/23/2021, 9:48 AM  Clinical Narrative:     Everlene Balls called about auth informing CSW that medical director is requesting a peer to peer with deadline at 2pm. [phone] select option #5. Need Pt name, bd, and member ID number  [id number]: CSW is notified by MD that peer to peer completed and that [facility] medical director denied SNF auth stating that pt needs LTC and would not benefit from rehab.   CSW called Logan with [facility] who provides verbal update on pt's last PT notes while at [facility] which stated pt was walking 50 feet and transferring to wheelchair independently.  CSW called [facility] to speak with care coordinator and explained that pt is not at baseline at all. CSW provided details of pt's PLOF. Care coordinator stated that their clinicals received from [facility] do not reflect that pt was independent but she cited notes from late February 2022. Care coordinator said she could not assist in anyway. Next step would be for pt to appeal. Pt/family would need to call [phone] to appeal and use [id number].   CSW updated pt daughter; she is upset and does not want to appeal . She request that pt can return to [place]. CSW explained he would have to coordinate with [place].   CSW called Clyve with [place] and updated him of situation. He stated he would have to review with his team. He would also talk with pt daughter about her options.  1230: CSW received call from pt daughter; she would like to seek any LTC facility with medicaid pending. She does plan to appeal insurance auth. CSW texted her phone number for appeal [phone] and [id number]   Expected Discharge Plan: [place] Barriers to Discharge: Continued Medical Work up  Expected Discharge Plan  and Services Expected Discharge Plan: [place]       Living arrangements for the past 2 months: [place]                                       Social Determinants of Health (SDOH) Interventions    Readmission Risk Interventions Readmission Risk Prevention Plan 11/17/2020 06/29/2020  Transportation Screening Complete Complete  Home Care Screening - Complete  Medication Review (RN CM) - Complete  Medication Review ([place]) Complete -  PCP or Specialist appointment within 3-5 days of discharge Complete -  SW Recovery Care/Counseling Consult Complete -  [place] Complete -  Some recent data might be hidden

## 2021-02-23 NOTE — Progress Notes (Signed)
Patient refused CPAP for the night  

## 2021-02-23 NOTE — Progress Notes (Signed)
Patient ID: ABDULAZIZ TOMAN, male   DOB: 1941-09-03, 80 y.o.   MRN: [id number]  PROGRESS NOTE    DHEERAJ HAIL  EHO:[id number] DOB: 31-Jan-1941 DOA: 02/13/2021 PCP: Merrilee Seashore, MD   Brief Narrative:  80 year old male with history of hypertension, chronic pain, chronic depression, resident at a long-term facility presented with hypoxia with oxygen saturation in the 80s.  He was given Narcan x2: He responded briefly. He has been a DNR in the past however discussions with family led to reversal of his CODE STATUS to full code. Patient was placed on a Narcan drip. He was found to be hypercarbic with an elevated PCO2.  He started vomiting while on BiPAP. There was definite concern for aspiration.  He subsequently was intubated and admitted to ICU.  During the hospitalization, he was extubated and [facility] assumed care on 02/20/2021.  He has completed antibiotic course for aspiration pneumonitis  Assessment & Plan:   Acute hypoxic respiratory failure Aspiration pneumonitis -Patient required intubation on presentation.  He has subsequently been extubated.  Currently on room air.  He has completed 5 days course of Unasyn  Delirium/resolved slurring of speech -CT of the head was unremarkable for any acute intracranial findings.  Physical exam was nonfocal. -Mental status has returned back to baseline  Elevated LFTs -Unclear etiology.  Improving.  Hypokalemia -Replace.  Repeat a.m. labs leukocytosis -Mild.  Questionable blood.  Anemia of chronic disease -Probably from chronic illnesses.  Hemoglobin stable.  No signs of bleeding  Dysphagia -Diet as per SLP recommendations.  Continue aspiration precautions   Chronic pain syndrome -At home he was on Suboxone, oxycodone, gabapentin -Home gabapentin has been restarted.  Chronic anxiety/depression -Continue home duloxetine  OSA -CPAP at night  Essential hypertension -BP is currently at goal. -Continue home Norvasc 10 mg  daily.  Physical debility/ambulatory dysfunction. -Patient was a long-term resident in the facility prior to presentation.  She does not want to go back to that facility.  Social worker following. -Palliative care consultation for goals of care discussion  DVT prophylaxis: Lovenox Code Status: Full Family Communication: None at bedside Disposition Plan: Status is: Inpatient  Remains inpatient appropriate because:Inpatient level of care appropriate due to severity of illness   Dispo:  Patient From: [facility]  Planned Disposition: [place]  Medically stable for discharge: Yes    Consultants: PCCM  Procedures: Intubation/extubation  Antimicrobials: Completed 5 days of Unasyn   Subjective: Patient seen and examined at bedside.  Poor historian.  Denies worsening shortness of breath, cough, fever, vomiting.  Objective: Vitals:   02/22/21 1528 02/22/21 2050 02/23/21 0442 02/23/21 0809  BP: 125/82 132/71 (!) 142/77 (!) 146/77  Pulse: 82 81 81 77  Resp: 16 16 14 16   Temp: 98.2 F (36.8 C) 98.6 F (37 C) 98.6 F (37 C) 98.1 F (36.7 C)  TempSrc: Oral Oral Oral Oral  SpO2: 100% 95% 97% 100%  Weight:      Height:       No intake or output data in the 24 hours ending 02/23/21 1015 Filed Weights   02/17/21 0342 02/19/21 0500 02/20/21 0400  Weight: 110.6 kg 99.1 kg 103 kg    Examination:  General exam: Appears calm and comfortable.  Currently on room air.  Looks chronically ill. Respiratory system: Bilateral decreased breath sounds at bases with scattered crackles Cardiovascular system: S1 & S2 heard, Rate controlled Gastrointestinal system: Abdomen is nondistended, soft and nontender. Normal bowel sounds heard. Extremities: No cyanosis, clubbing;  trace lower extremity edema Central nervous system: Awake; poor historian.  Very slow to respond.  No focal neurological deficits. Moving extremities Skin: No rashes, lesions or  ulcers Psychiatry: Flat affect    Data Reviewed: I have personally reviewed following labs and imaging studies  CBC: Recent Labs  Lab 02/17/21 0400 02/18/21 0237 02/19/21 0126 02/21/21 1801  WBC 12.9* 12.5* 12.4* 12.5*  HGB 9.5* 9.0* 9.9* 9.7*  HCT 30.0* 27.2* 30.3* 30.2*  MCV 96.5 95.8 93.8 94.7  PLT 289 310 356 010*   Basic Metabolic Panel: Recent Labs  Lab 02/18/21 0237 02/19/21 0126 02/19/21 0518 02/21/21 0616 02/21/21 1801 02/22/21 1334  NA 140 141 142  --  138 138  K 4.2 3.1* 3.4* 4.3 3.0* 3.1*  CL 104 101 102  --  100 101  CO2 28 30 31   --  29 29  GLUCOSE 129* 123* 126*  --  139* 128*  BUN 26* 23 23  --  12 15  CREATININE 0.69 0.76 0.77 0.75 0.70 0.70  CALCIUM 9.0 9.4 9.3  --  9.5 9.3  MG 2.0 2.2  --   --  2.1  --   PHOS  --   --   --   --  3.3  --    GFR: Estimated Creatinine Clearance: 82.8 mL/min (by C-G formula based on SCr of 0.7 mg/dL). Liver Function Tests: Recent Labs  Lab 02/21/21 1801 02/22/21 1334  AST 45* 32  ALT 77* 64*  ALKPHOS 57 60  BILITOT 0.6 0.5  PROT 5.8* 5.9*  ALBUMIN 2.8* 2.7*   No results for input(s): LIPASE, AMYLASE in the last 168 hours. No results for input(s): AMMONIA in the last 168 hours. Coagulation Profile: No results for input(s): INR, PROTIME in the last 168 hours. Cardiac Enzymes: No results for input(s): CKTOTAL, CKMB, CKMBINDEX, TROPONINI in the last 168 hours. BNP (last 3 results) No results for input(s): PROBNP in the last 8760 hours. HbA1C: No results for input(s): HGBA1C in the last 72 hours. CBG: Recent Labs  Lab 02/18/21 2323 02/19/21 0317 02/19/21 0805 02/19/21 1126 02/19/21 1537  GLUCAP 130* 113* 112* 130* 136*   Lipid Profile: No results for input(s): CHOL, HDL, LDLCALC, TRIG, CHOLHDL, LDLDIRECT in the last 72 hours. Thyroid Function Tests: No results for input(s): TSH, T4TOTAL, FREET4, T3FREE, THYROIDAB in the last 72 hours. Anemia Panel: No results for input(s): VITAMINB12, FOLATE,  FERRITIN, TIBC, IRON, RETICCTPCT in the last 72 hours. Sepsis Labs: Recent Labs  Lab 02/22/21 1334  PROCALCITON <0.10    Recent Results (from the past 240 hour(s))  SARS CORONAVIRUS 2 (TAT 6-24 HRS) Nasopharyngeal Nasopharyngeal Swab     Status: None   Collection Time: 02/14/21  6:30 AM   Specimen: Nasopharyngeal Swab  Result Value Ref Range Status   SARS Coronavirus 2 NEGATIVE NEGATIVE Final    Comment: (NOTE) SARS-CoV-2 target nucleic acids are NOT DETECTED.  The SARS-CoV-2 RNA is generally detectable in upper and lower respiratory specimens during the acute phase of infection. Negative results do not preclude SARS-CoV-2 infection, do not rule out co-infections with other pathogens, and should not be used as the sole basis for treatment or other patient management decisions. Negative results must be combined with clinical observations, patient history, and epidemiological information. The expected result is Negative.  Fact Sheet for Patients: SugarRoll.be  Fact Sheet for Healthcare Providers: https://www.woods-mathews.com/  This test is not yet approved or cleared by the [place] FDA and  has been authorized for  detection and/or diagnosis of SARS-CoV-2 by FDA under an Emergency Use Authorization (EUA). This EUA will remain  in effect (meaning this test can be used) for the duration of the COVID-19 declaration under Se ction 564(b)(1) of the Act, 21 U.S.C. section 360bbb-3(b)(1), unless the authorization is terminated or revoked sooner.  Performed at [facility], [place] [address], [place], [place] [address]   Urine culture     Status: Abnormal   Collection Time: 02/14/21  9:36 AM   Specimen: Urine, Clean Catch  Result Value Ref Range Status   Specimen Description URINE, CLEAN CATCH  Final   Special Requests   Final    NONE Performed at [facility], [place] [address], [place], [place] [address]    Culture  MULTIPLE SPECIES PRESENT, SUGGEST RECOLLECTION (A)  Final   Report Status 02/16/2021 FINAL  Final  Culture, blood (routine x 2)     Status: None   Collection Time: 02/14/21 10:56 AM   Specimen: BLOOD RIGHT HAND  Result Value Ref Range Status   Specimen Description BLOOD RIGHT HAND  Final   Special Requests   Final    BOTTLES DRAWN AEROBIC ONLY Blood Culture results may not be optimal due to an inadequate volume of blood received in culture bottles   Culture   Final    NO GROWTH 5 DAYS Performed at [facility], [place] [address] [place], [place] [address]    Report Status 02/19/2021 FINAL  Final  MRSA PCR Screening     Status: None   Collection Time: 02/14/21 12:41 PM   Specimen: Nasal Mucosa; Nasopharyngeal  Result Value Ref Range Status   MRSA by PCR NEGATIVE NEGATIVE Final    Comment:        The GeneXpert MRSA Assay (FDA approved for NASAL specimens only), is one component of a comprehensive MRSA colonization surveillance program. It is not intended to diagnose MRSA infection nor to guide or monitor treatment for MRSA infections. Performed at [facility], [place] [address], [place], [place] [address]   Culture, blood (routine x 2)     Status: None   Collection Time: 02/15/21  1:50 AM   Specimen: BLOOD  Result Value Ref Range Status   Specimen Description BLOOD RIGHT ANTECUBITAL  Final   Special Requests   Final    BOTTLES DRAWN AEROBIC ONLY Blood Culture results may not be optimal due to an excessive volume of blood received in culture bottles   Culture   Final    NO GROWTH 5 DAYS Performed at [facility], [place] [address], [place], [place] [address]    Report Status 02/20/2021 FINAL  Final  Culture, blood (routine x 2)     Status: None   Collection Time: 02/15/21  2:03 AM   Specimen: BLOOD  Result Value Ref Range Status   Specimen Description BLOOD RIGHT ANTECUBITAL  Final   Special Requests   Final    BOTTLES DRAWN AEROBIC ONLY Blood Culture results  may not be optimal due to an excessive volume of blood received in culture bottles   Culture   Final    NO GROWTH 5 DAYS Performed at [facility], [place] [address] [place], [place] [address]    Report Status 02/20/2021 FINAL  Final  SARS CORONAVIRUS 2 (TAT 6-24 HRS) Nasopharyngeal Nasopharyngeal Swab     Status: None   Collection Time: 02/22/21  3:18 PM   Specimen: Nasopharyngeal Swab  Result  Value Ref Range Status   SARS Coronavirus 2 NEGATIVE NEGATIVE Final    Comment: (NOTE) SARS-CoV-2 target nucleic acids are NOT DETECTED.  The SARS-CoV-2 RNA is generally detectable in upper and lower respiratory specimens during the acute phase of infection. Negative results do not preclude SARS-CoV-2 infection, do not rule out co-infections with other pathogens, and should not be used as the sole basis for treatment or other patient management decisions. Negative results must be combined with clinical observations, patient history, and epidemiological information. The expected result is Negative.  Fact Sheet for Patients: SugarRoll.be  Fact Sheet for Healthcare Providers: https://www.woods-mathews.com/  This test is not yet approved or cleared by the [place] FDA and  has been authorized for detection and/or diagnosis of SARS-CoV-2 by FDA under an Emergency Use Authorization (EUA). This EUA will remain  in effect (meaning this test can be used) for the duration of the COVID-19 declaration under Se ction 564(b)(1) of the Act, 21 U.S.C. section 360bbb-3(b)(1), unless the authorization is terminated or revoked sooner.  Performed at [facility], [place] [address] [place], [place] [address]          Radiology Studies: No results found.      Scheduled Meds: . amLODipine  10 mg Oral Daily  . chlorhexidine gluconate (MEDLINE KIT)  15 mL Mouth Rinse BID  . Chlorhexidine Gluconate Cloth  6 each Topical Daily  . DULoxetine  30  mg Oral BID  . enoxaparin (LOVENOX) injection  50 mg Subcutaneous Q24H  . folic acid  1 mg Oral Daily  . gabapentin  100 mg Oral TID  . multivitamin with minerals  1 tablet Oral Daily  . pantoprazole  40 mg Oral Daily  . polyethylene glycol  17 g Oral BID  . potassium chloride  40 mEq Oral BID  . Ensure Max Protein  11 oz Oral BID  . senna-docusate  1 tablet Oral BID  . sodium chloride flush  10-40 mL Intracatheter Q12H  . thiamine  100 mg Oral Daily   Or  . thiamine  100 mg Intravenous Daily   Continuous Infusions: . sodium chloride Stopped (02/15/21 1055)          Aline August, MD [facility] 02/23/2021, 10:15 AM

## 2021-02-24 ENCOUNTER — Encounter (HOSPITAL_COMMUNITY): Payer: Self-pay | Admitting: Pulmonary Disease

## 2021-02-24 DIAGNOSIS — Z515 Encounter for palliative care: Secondary | ICD-10-CM

## 2021-02-24 DIAGNOSIS — G9341 Metabolic encephalopathy: Secondary | ICD-10-CM

## 2021-02-24 DIAGNOSIS — Z7189 Other specified counseling: Secondary | ICD-10-CM | POA: Diagnosis not present

## 2021-02-24 DIAGNOSIS — J9602 Acute respiratory failure with hypercapnia: Secondary | ICD-10-CM | POA: Diagnosis not present

## 2021-02-24 DIAGNOSIS — G894 Chronic pain syndrome: Secondary | ICD-10-CM | POA: Diagnosis not present

## 2021-02-24 DIAGNOSIS — J690 Pneumonitis due to inhalation of food and vomit: Secondary | ICD-10-CM | POA: Diagnosis not present

## 2021-02-24 LAB — BASIC METABOLIC PANEL
Anion gap: 5 (ref 5–15)
BUN: 14 mg/dL (ref 8–23)
CO2: 27 mmol/L (ref 22–32)
Calcium: 9.3 mg/dL (ref 8.9–10.3)
Chloride: 103 mmol/L (ref 98–111)
Creatinine, Ser: 0.73 mg/dL (ref 0.61–1.24)
GFR, Estimated: >60 mL/min (ref >60)
Glucose, Bld: 126 mg/dL — ABNORMAL HIGH (ref 70–99)
Potassium: 3.9 mmol/L (ref 3.5–5.1)
Sodium: 135 mmol/L (ref 135–145)

## 2021-02-24 LAB — CBC WITH DIFFERENTIAL/PLATELET
Abs Immature Granulocytes: 0.24 10*3/uL — ABNORMAL HIGH (ref 0.00–0.07)
Basophils Absolute: 0.1 10*3/uL (ref 0.0–0.1)
Basophils Relative: 1 %
Eosinophils Absolute: 0.3 10*3/uL (ref 0.0–0.5)
Eosinophils Relative: 3 %
HCT: 31 % — ABNORMAL LOW (ref 39.0–52.0)
Hemoglobin: 9.9 g/dL — ABNORMAL LOW (ref 13.0–17.0)
Immature Granulocytes: 2 %
Lymphocytes Relative: 20 %
Lymphs Abs: 2 10*3/uL (ref 0.7–4.0)
MCH: 30 pg (ref 26.0–34.0)
MCHC: 31.9 g/dL (ref 30.0–36.0)
MCV: 93.9 fL (ref 80.0–100.0)
Monocytes Absolute: 1 10*3/uL (ref 0.1–1.0)
Monocytes Relative: 10 %
Neutro Abs: 6.3 10*3/uL (ref 1.7–7.7)
Neutrophils Relative %: 64 %
Platelets: 398 10*3/uL (ref 150–400)
RBC: 3.3 MIL/uL — ABNORMAL LOW (ref 4.22–5.81)
RDW: 13.2 % (ref 11.5–15.5)
WBC: 9.8 10*3/uL (ref 4.0–10.5)
nRBC: 0 % (ref 0.0–0.2)

## 2021-02-24 LAB — MAGNESIUM: Magnesium: 2.1 mg/dL (ref 1.7–2.4)

## 2021-02-24 MED ORDER — FOLIC ACID 1 MG PO TABS: 1.0000 mg | ORAL_TABLET | Freq: Every day | ORAL | 0 refills | Status: DC

## 2021-02-24 MED ORDER — METHOCARBAMOL 500 MG PO TABS: 500.0000 mg | ORAL_TABLET | Freq: Two times a day (BID) | ORAL | Status: DC | PRN

## 2021-02-24 MED ORDER — GABAPENTIN 400 MG PO CAPS
400.0000 mg | ORAL_CAPSULE | Freq: Three times a day (TID) | ORAL | Status: DC
  Administered 2021-02-24: 400 mg via ORAL
  Filled 2021-02-24: qty 1

## 2021-02-24 MED ORDER — THIAMINE HCL 100 MG PO TABS: 100.0000 mg | ORAL_TABLET | Freq: Every day | ORAL | 0 refills | Status: AC

## 2021-02-24 MED ORDER — LORAZEPAM 1 MG PO TABS: 0.5000 mg | ORAL_TABLET | Freq: Three times a day (TID) | ORAL | 0 refills | Status: DC | PRN

## 2021-02-24 MED ORDER — DULOXETINE HCL 30 MG PO CPEP: 30.0000 mg | ORAL_CAPSULE | Freq: Two times a day (BID) | ORAL | Status: DC

## 2021-02-24 NOTE — Progress Notes (Signed)
Report called to Jeri-nurse at [facility], all questions answered, [facility] was called for transport by Education officer, museum, will await for them to pick pt up.

## 2021-02-24 NOTE — Progress Notes (Signed)
Occupational Therapy Treatment Patient Details Name: Jorge Sherman MRN: [id number] DOB: 06-13-1941 Today's Date: 02/24/2021    History of present illness Pt adm 3/26 from SNF with acute hypoxic respiratory failure with likely aspiration PNA after vomiting. Bilious vomiting likely due to chronic opiate use due to chronic pain. Narcan given at facility. Pt intubated 3/27-3/31. PMH - chronic pain, multiple back surgeries, HTN, DM, depression.   OT comments  Pt making progress with functional goals, but prefers to spend more time in the bed than being OOB. OT will continue to follow acutely to maximize level of function and safety  Follow Up Recommendations  SNF;Supervision/Assistance - 24 hour    Equipment Recommendations  Other (comment) (TBD at next venue of care)    Recommendations for Other Services      Precautions / Restrictions Precautions Precautions: Fall Precaution Comments: "bad knees" Restrictions Weight Bearing Restrictions: No       Mobility Bed Mobility Overal bed mobility: Needs Assistance Bed Mobility: Rolling;Sidelying to Sit Rolling: Min assist Sidelying to sit: Min assist Supine to sit: Min assist     General bed mobility comments: pt eager to transfer to EOB, pt able to bring LEs off EOB, pulled up using bed rail and was able to push up from R elbow to push into sitting EOB, minA for safety to assist with inititiation of trunk elevation/getting up on elbow    Transfers Overall transfer level: Needs assistance Equipment used:  (stedy) Transfers: Sit to/from [facility] Sit to Stand: Mod assist Stand pivot transfers:  (used stedy)       General transfer comment: used RW sit - stand from EOb for SPT to recliner    Balance Overall balance assessment: Needs assistance Sitting-balance support: Feet supported;No upper extremity supported Sitting balance-Leahy Scale: Good     Standing balance support: Bilateral upper extremity  supported Standing balance-Leahy Scale: Zero Standing balance comment: dependent on stedy                           ADL either performed or assessed with clinical judgement   ADL Overall ADL's : Needs assistance/impaired Eating/Feeding: Set up;Sitting   Grooming: Wash/dry hands;Wash/dry face;Sitting;Set up                   Toilet Transfer: Moderate assistance;RW;Cueing for safety;Cueing for sequencing Toilet Transfer Details (indicate cue type and reason): simulated to recliner                 Vision Baseline Vision/History: Wears glasses Patient Visual Report: No change from baseline     Perception     Praxis      Cognition Arousal/Alertness: Awake/alert Behavior During Therapy: WFL for tasks assessed/performed Overall Cognitive Status: No family/caregiver present to determine baseline cognitive functioning                                 General Comments: pt A&Ox3, pt able to follow all commands, pt able to state need to use bathroom, pt giving valliant effort t/o entire session, pt much improved from initial eval        Exercises     Shoulder Instructions       General Comments SpO2 >100% on RA, pt assisted to the bathroom using stedy. Pt with small bowel movement, dependent for hygiene    Pertinent Vitals/ Pain  Pain Assessment: No/denies pain Faces Pain Scale: No hurt  Home Living                                          Prior Functioning/Environment              Frequency  Min 2X/week        Progress Toward Goals  OT Goals(current goals can now be found in the care plan section)  Progress towards OT goals: Progressing toward goals  Acute Rehab OT Goals Patient Stated Goal: get out of here  Plan      Co-evaluation                 AM-PAC OT "6 Clicks" Daily Activity     Outcome Measure   Help from another person eating meals?: A Little Help from another person  taking care of personal grooming?: A Little Help from another person toileting, which includes using toliet, bedpan, or urinal?: Total Help from another person bathing (including washing, rinsing, drying)?: A Lot Help from another person to put on and taking off regular upper body clothing?: A Lot Help from another person to put on and taking off regular lower body clothing?: Total 6 Click Score: 12    End of Session Equipment Utilized During Treatment: Rolling walker;Gait belt  OT Visit Diagnosis: Unsteadiness on feet (R26.81);Other abnormalities of gait and mobility (R26.89);Muscle weakness (generalized) (M62.81)   Activity Tolerance Patient tolerated treatment well;Patient limited by fatigue   Patient Left in chair;with call bell/phone within reach;with chair alarm set   Nurse Communication          Time: [phone] OT Time Calculation (min): 25 min  Charges: OT General Charges $OT Visit: 1 Visit OT Treatments $Self Care/Home Management : 8-22 mins $Therapeutic Activity: 8-22 mins    Britt Bottom 02/24/2021, 2:30 PM

## 2021-02-24 NOTE — Plan of Care (Signed)
  Problem: Safety: Goal: Non-violent Restraint(s) Outcome: Adequate for Discharge   Problem: Education: Goal: Knowledge of General Education information will improve Description: Including pain rating scale, medication(s)/side effects and non-pharmacologic comfort measures Outcome: Adequate for Discharge   Problem: Health Behavior/Discharge Planning: Goal: Ability to manage health-related needs will improve Outcome: Adequate for Discharge   Problem: Clinical Measurements: Goal: Ability to maintain clinical measurements within normal limits will improve Outcome: Adequate for Discharge Goal: Will remain free from infection Outcome: Adequate for Discharge Goal: Diagnostic test results will improve Outcome: Adequate for Discharge Goal: Respiratory complications will improve Outcome: Adequate for Discharge Goal: Cardiovascular complication will be avoided Outcome: Adequate for Discharge   Problem: Activity: Goal: Risk for activity intolerance will decrease Outcome: Adequate for Discharge   Problem: Nutrition: Goal: Adequate nutrition will be maintained Outcome: Adequate for Discharge   Problem: Coping: Goal: Level of anxiety will decrease Outcome: Adequate for Discharge   Problem: Elimination: Goal: Will not experience complications related to bowel motility Outcome: Adequate for Discharge Goal: Will not experience complications related to urinary retention Outcome: Adequate for Discharge   Problem: Pain Managment: Goal: General experience of comfort will improve Outcome: Adequate for Discharge   Problem: Safety: Goal: Ability to remain free from injury will improve Outcome: Adequate for Discharge   Problem: Skin Integrity: Goal: Risk for impaired skin integrity will decrease Outcome: Adequate for Discharge   Problem: Acute Rehab PT Goals(only PT should resolve) Goal: Pt will Roll Supine to Side Outcome: Adequate for Discharge Goal: Pt Will Go Supine/Side To  Sit Outcome: Adequate for Discharge Goal: Pt Will Go Sit To Supine/Side Outcome: Adequate for Discharge   Problem: Acute Rehab OT Goals (only OT should resolve) Goal: Pt. Will Perform Eating Outcome: Adequate for Discharge Goal: Pt. Will Perform Grooming Outcome: Adequate for Discharge Goal: Pt. Will Transfer To Toilet Outcome: Adequate for Discharge Goal: OT Additional ADL Goal #1 Outcome: Adequate for Discharge Goal: OT Additional ADL Goal #2 Outcome: Adequate for Discharge

## 2021-02-24 NOTE — Progress Notes (Signed)
Aileen Pilot to be Discharged back to [facility] per MD order.  Report was previously called to Nurse Starla Link at [facility], all questions answered & AVS packet placed in envelope for receiving facility.  Allergies as of 02/24/2021      Reactions   Cefepime Rash   Redness   Iohexol Hives, Other (See Comments)   A few hives post CT   Fentanyl Itching, Nausea And Vomiting, Other (See Comments)   Patient states that it happened over 30 yrs ago   Iodine Hives, Rash      Medication List    STOP taking these medications   buprenorphine-naloxone 8-2 mg Subl SL tablet Commonly known as: SUBOXONE   oxyCODONE 5 MG immediate release tablet Commonly known as: Oxy IR/ROXICODONE   traMADol 50 MG tablet Commonly known as: ULTRAM     TAKE these medications   acetaminophen 500 MG tablet Commonly known as: TYLENOL Take 2 tablets (1,000 mg total) by mouth every 6 (six) hours as needed for headache, mild pain or fever.   albuterol 108 (90 Base) MCG/ACT inhaler Commonly known as: VENTOLIN HFA Inhale 2 puffs into the lungs every 4 (four) hours as needed for wheezing or shortness of breath.   amLODipine 10 MG tablet Commonly known as: NORVASC Take 1 tablet (10 mg total) by mouth daily.   atorvastatin 80 MG tablet Commonly known as: LIPITOR Take 80 mg by mouth daily.   BIOFREEZE EX Apply 1 application topically daily. To back of neck and lower back.   celecoxib 200 MG capsule Commonly known as: CELEBREX Take 200 mg by mouth daily.   cloNIDine 0.1 MG tablet Commonly known as: CATAPRES Take 0.1 mg by mouth 2 (two) times daily.   diclofenac Sodium 1 % Gel Commonly known as: VOLTAREN Apply 2 g topically 4 (four) times daily as needed for pain.   DULoxetine 30 MG capsule Commonly known as: CYMBALTA Take 1 capsule (30 mg total) by mouth 2 (two) times daily.   ferrous sulfate 325 (65 FE) MG tablet Take 325 mg by mouth daily.   folic acid 1 MG tablet Commonly known as: FOLVITE Take  1 tablet (1 mg total) by mouth daily. Start taking on: February 25, 2021   gabapentin 300 MG capsule Commonly known as: NEURONTIN Take 1 capsule (300 mg total) by mouth 3 (three) times daily. What changed:   how much to take  additional instructions  Another medication with the same name was removed. Continue taking this medication, and follow the directions you see here.   hydroxychloroquine 200 MG tablet Commonly known as: PLAQUENIL Take 200 mg by mouth 2 (two) times daily.   lidocaine 5 % Commonly known as: LIDODERM Place 1 patch onto the skin daily. Remove & Discard patch within 12 hours or as directed by MD. Right chest What changed: additional instructions   linaclotide 72 MCG capsule Commonly known as: LINZESS Take 72 mcg by mouth daily as needed (constipation).   loperamide 2 MG tablet Commonly known as: Imodium A-D Take 1 tablet (2 mg total) by mouth 4 (four) times daily as needed for diarrhea or loose stools.   LORazepam 1 MG tablet Commonly known as: ATIVAN Take 0.5 tablets (0.5 mg total) by mouth every 8 (eight) hours as needed for anxiety. What changed: how much to take   methocarbamol 500 MG tablet Commonly known as: ROBAXIN Take 1 tablet (500 mg total) by mouth 2 (two) times daily as needed for muscle spasms (hold for  sedation).   multivitamin with minerals Tabs tablet Take 1 tablet by mouth daily.   ondansetron 4 MG tablet Commonly known as: ZOFRAN Take 1 tablet (4 mg total) by mouth every 6 (six) hours as needed for nausea.   pantoprazole 40 MG tablet Commonly known as: PROTONIX Take 1 tablet (40 mg total) by mouth daily.   polyethylene glycol 17 g packet Commonly known as: MIRALAX / GLYCOLAX Take 17 g by mouth daily as needed for mild constipation.   rosuvastatin 40 MG tablet Commonly known as: CRESTOR Take 40 mg by mouth every evening.   SUMAtriptan 25 MG tablet Commonly known as: IMITREX Take 25 mg by mouth daily as needed for migraine. May  repeat in 2 hours if headache persists or recurs.   tamsulosin 0.4 MG Caps capsule Commonly known as: FLOMAX Take 0.4 mg by mouth daily.   thiamine 100 MG tablet Take 1 tablet (100 mg total) by mouth daily. Start taking on: February 25, 2021            Durable Medical Equipment  (From admission, onward)         Start     Ordered   02/21/21 1539  For home use only DME Hospital bed  Once       Question Answer Comment  Length of Need Lifetime   The above medical condition requires: Patient requires the ability to reposition frequently   Head must be elevated greater than: 30 degrees   Bed type Semi-electric   Hoyer Lift Yes      02/21/21 1539          Vitals:   02/24/21 0736 02/24/21 1500  BP: (!) 143/78 140/80  Pulse: 81 80  Resp: 17 17  Temp: 98 F (36.7 C) 98.2 F (36.8 C)  SpO2: 97% 98%    IV catheters discontinued, Site without signs and symptoms of complications. Dressing and pressure applied. No complaints or distressed currently noted.  An After Visit Summary was printed and given to the patient. Patient escorted via stretcher, and Discharged back to [facility] via [place] with ambulance personnel.  [place] 02/24/2021 3:52 PM

## 2021-02-24 NOTE — Discharge Summary (Signed)
Physician Discharge Summary  Jorge Sherman FYB:[id number] DOB: 10-Feb-1941 DOA: 02/13/2021  PCP: Merrilee Seashore, MD  Admit date: 02/13/2021 Discharge date: 02/24/2021  Admitted From: SNF Disposition: SNF  Recommendations for Outpatient Follow-up:  1. Follow up with SNF provider at earliest convenience with repeat CBC/BMP 2. Outpatient evaluation and follow-up by palliative care 3. Considering evaluation by pain management 4. Follow up in ED if symptoms worsen or new appear   Home Health: No Equipment/Devices: None  Discharge Condition: Guarded to poor CODE STATUS: Full Diet recommendation: Heart healthy/diet as per SLP recommendations  Brief/Interim Summary: 80 year old male with history of hypertension, chronic pain, chronic depression, resident at a long-term facility presented with hypoxia with oxygen saturation in the 80s.  He was given Narcan x2: He responded briefly. He has been a DNR in the past however discussions with family led to reversal of his CODE STATUS to full code. Patient was placed on a Narcan drip. He was found to be hypercarbic with an elevated PCO2. He started vomiting while on BiPAP. There was definite concern for aspiration.  He subsequently was intubated and admitted to ICU.  During the hospitalization, he was extubated and TR[facility] assumed care on 02/20/2021.  He has completed antibiotic course for aspiration pneumonitis.  PT recommended SNF placement.  He will be discharged to SNF once bed is available.  Recommend outpatient evaluation and follow-up by palliative care.   Discharge Diagnoses:   Acute hypoxic respiratory failure Aspiration pneumonitis -Patient required intubation on presentation.  He has subsequently been extubated.  Currently on room air.  He has completed 5 days course of Unasyn  Delirium/resolved slurring of speech -CT of the head was unremarkable for any acute intracranial findings.  Physical exam was nonfocal. -Mental status has  returned back to baseline  Elevated LFTs -Unclear etiology.  Improving.  Hypokalemia -Improved   leukocytosis -Resolved  Anemia of chronic disease -Probably from chronic illnesses.  Hemoglobin stable.  No signs of bleeding.  Outpatient follow-up.  Dysphagia -Diet as per SLP recommendations.  Continue aspiration precautions  Chronic pain syndrome -At home he was on Suboxone, oxycodone, gabapentin -Home gabapentin has been restarted but at a lower dose.  Suboxone and oxycodone will remain on hold till reevaluation by SNF provider.  Consider outpatient pain management evaluation.  Chronic anxiety/depression -Continue home duloxetine  OSA -CPAP at night  Essential hypertension -BP is currently at goal. -Continuehome Norvasc 10 mg daily.   Clonidine has been resumed.  Physical debility/ambulatory dysfunction. -PT recommended SNF placement.  Discharge back to SNF once bed is available. -Palliative care consultation for goals of care discussion is pending.  This can happen as an outpatient.  Discharge Instructions  Discharge Instructions    Amb Referral to Palliative Care   Complete by: As directed    Goals of care discussion   Diet - low sodium heart healthy   Complete by: As directed    Increase activity slowly   Complete by: As directed      Allergies as of 02/24/2021      Reactions   Cefepime Rash   Redness   Iohexol Hives, Other (See Comments)   A few hives post CT   Fentanyl Itching, Nausea And Vomiting, Other (See Comments)   Patient states that it happened over 30 yrs ago   Iodine Hives, Rash      Medication List    STOP taking these medications   buprenorphine-naloxone 8-2 mg Subl SL tablet Commonly known as: SUBOXONE   oxyCODONE  5 MG immediate release tablet Commonly known as: Oxy IR/ROXICODONE   traMADol 50 MG tablet Commonly known as: ULTRAM     TAKE these medications   acetaminophen 500 MG tablet Commonly known as: TYLENOL Take  2 tablets (1,000 mg total) by mouth every 6 (six) hours as needed for headache, mild pain or fever.   albuterol 108 (90 Base) MCG/ACT inhaler Commonly known as: VENTOLIN HFA Inhale 2 puffs into the lungs every 4 (four) hours as needed for wheezing or shortness of breath.   amLODipine 10 MG tablet Commonly known as: NORVASC Take 1 tablet (10 mg total) by mouth daily.   atorvastatin 80 MG tablet Commonly known as: LIPITOR Take 80 mg by mouth daily.   BIOFREEZE EX Apply 1 application topically daily. To back of neck and lower back.   celecoxib 200 MG capsule Commonly known as: CELEBREX Take 200 mg by mouth daily.   cloNIDine 0.1 MG tablet Commonly known as: CATAPRES Take 0.1 mg by mouth 2 (two) times daily.   diclofenac Sodium 1 % Gel Commonly known as: VOLTAREN Apply 2 g topically 4 (four) times daily as needed for pain.   DULoxetine 30 MG capsule Commonly known as: CYMBALTA Take 1 capsule (30 mg total) by mouth 2 (two) times daily.   ferrous sulfate 325 (65 FE) MG tablet Take 325 mg by mouth daily.   folic acid 1 MG tablet Commonly known as: FOLVITE Take 1 tablet (1 mg total) by mouth daily. Start taking on: February 25, 2021   gabapentin 300 MG capsule Commonly known as: NEURONTIN Take 1 capsule (300 mg total) by mouth 3 (three) times daily. What changed:   how much to take  additional instructions  Another medication with the same name was removed. Continue taking this medication, and follow the directions you see here.   hydroxychloroquine 200 MG tablet Commonly known as: PLAQUENIL Take 200 mg by mouth 2 (two) times daily.   lidocaine 5 % Commonly known as: LIDODERM Place 1 patch onto the skin daily. Remove & Discard patch within 12 hours or as directed by MD. Right chest What changed: additional instructions   linaclotide 72 MCG capsule Commonly known as: LINZESS Take 72 mcg by mouth daily as needed (constipation).   loperamide 2 MG tablet Commonly  known as: Imodium A-D Take 1 tablet (2 mg total) by mouth 4 (four) times daily as needed for diarrhea or loose stools.   LORazepam 1 MG tablet Commonly known as: ATIVAN Take 0.5 tablets (0.5 mg total) by mouth every 8 (eight) hours as needed for anxiety. What changed: how much to take   methocarbamol 500 MG tablet Commonly known as: ROBAXIN Take 1 tablet (500 mg total) by mouth 2 (two) times daily as needed for muscle spasms (hold for sedation).   multivitamin with minerals Tabs tablet Take 1 tablet by mouth daily.   ondansetron 4 MG tablet Commonly known as: ZOFRAN Take 1 tablet (4 mg total) by mouth every 6 (six) hours as needed for nausea.   pantoprazole 40 MG tablet Commonly known as: PROTONIX Take 1 tablet (40 mg total) by mouth daily.   polyethylene glycol 17 g packet Commonly known as: MIRALAX / GLYCOLAX Take 17 g by mouth daily as needed for mild constipation.   rosuvastatin 40 MG tablet Commonly known as: CRESTOR Take 40 mg by mouth every evening.   SUMAtriptan 25 MG tablet Commonly known as: IMITREX Take 25 mg by mouth daily as needed for migraine. May repeat  in 2 hours if headache persists or recurs.   tamsulosin 0.4 MG Caps capsule Commonly known as: FLOMAX Take 0.4 mg by mouth daily.   thiamine 100 MG tablet Take 1 tablet (100 mg total) by mouth daily. Start taking on: February 25, 2021            Durable Medical Equipment  (From admission, onward)         Start     Ordered   02/21/21 1539  For home use only DME Hospital bed  Once       Question Answer Comment  Length of Need Lifetime   The above medical condition requires: Patient requires the ability to reposition frequently   Head must be elevated greater than: 30 degrees   Bed type Semi-electric   Hoyer Lift Yes      02/21/21 1539          Follow-up Information    Merrilee Seashore, MD. Schedule an appointment as soon as possible for a visit in 1 week(s).   Specialty: Internal  Medicine Why: With repeat CBC/BMP Contact information: [place] [address] [phone]        Belva Crome, MD .   Specialty: Cardiology Contact information: [phone] [place] [address] [phone]              Allergies  Allergen Reactions  . Cefepime Rash    Redness  . Iohexol Hives and Other (See Comments)    A few hives post CT  . Fentanyl Itching, Nausea And Vomiting and Other (See Comments)    Patient states that it happened over 30 yrs ago  . Iodine Hives and Rash    Consultations:  PCCM   Procedures/Studies: CT ABDOMEN PELVIS WO CONTRAST  Result Date: 02/14/2021 CLINICAL DATA:  Nausea and vomiting EXAM: CT ABDOMEN AND PELVIS WITHOUT CONTRAST TECHNIQUE: Multidetector CT imaging of the abdomen and pelvis was performed following the standard protocol without IV contrast. COMPARISON:  01/07/2021 FINDINGS: Lower chest: Dependent bibasilar atelectasis. Heart size is within normal limits. Marked coronary artery calcifications. Bilateral gynecomastia. Hepatobiliary: Unenhanced liver is within normal limits. Multiple small layering stones within the gallbladder lumen. No pericholecystic inflammatory changes by CT. No biliary dilatation. Pancreas: Unremarkable. No pancreatic ductal dilatation or surrounding inflammatory changes. Spleen: Normal in size without focal abnormality. Adrenals/Urinary Tract: Stable 1.7 cm fat containing left adrenal myelolipoma. Adrenal glands otherwise unremarkable. Stable small right renal cyst. Mild left hydronephrosis. No renal or ureteral stone. No right-sided hydronephrosis. Urinary bladder is markedly distended. Stomach/Bowel: Enteric tube terminates within the gastric body. Stomach otherwise unremarkable. Small bowel is within normal limits. No dilated loops of bowel. Scattered colonic diverticulosis. No focal bowel wall thickening or inflammatory changes. Vascular/Lymphatic:  Aortoiliac atherosclerosis without aneurysm. No abdominopelvic lymphadenopathy. Reproductive: Unremarkable. Other: Soft tissue nodularity along the posterior peritoneal surface adjacent to the inferior aspect of the right hepatic lobe and just below the right hepatic lobe are unchanged compared to numerous previous studies, benign. No ascites. No abdominopelvic fluid collection. No pneumoperitoneum. No abdominal wall hernia. Musculoskeletal: Extensive postoperative changes within the lumbar spine. No new or acute osseous findings. IMPRESSION: 1. Markedly distended urinary bladder with mild left hydronephrosis. No renal or ureteral stone. Findings may reflect urinary retention. 2. Cholelithiasis without evidence of acute cholecystitis. 3. Colonic diverticulosis without evidence of acute diverticulitis. 4. Aortic atherosclerosis (ICD10-I70.0). Electronically Signed   By: Davina Poke D.O.   On: 02/14/2021 12:13  CT HEAD WO CONTRAST  Result Date: 02/20/2021 CLINICAL DATA:  Anisocoria EXAM: CT HEAD WITHOUT CONTRAST TECHNIQUE: Contiguous axial images were obtained from the base of the skull through the vertex without intravenous contrast. COMPARISON:  02/14/2021 FINDINGS: Brain: There is no mass, hemorrhage or extra-axial collection. There is generalized atrophy without lobar predilection. Hypodensity of the white matter is most commonly associated with chronic microvascular disease. Vascular: Atherosclerotic calcification of the internal carotid arteries at the skull base. No abnormal hyperdensity of the major intracranial arteries or dural venous sinuses. Skull: The visualized skull base, calvarium and extracranial soft tissues are normal. Sinuses/Orbits: No fluid levels or advanced mucosal thickening of the visualized paranasal sinuses. No mastoid or middle ear effusion. The orbits are normal. IMPRESSION: Generalized atrophy and chronic microvascular ischemia without acute intracranial abnormality.  Electronically Signed   By: Ulyses Jarred M.D.   On: 02/20/2021 03:45   CT HEAD WO CONTRAST  Result Date: 02/14/2021 CLINICAL DATA:  80 year old male with altered mental status. EXAM: CT HEAD WITHOUT CONTRAST TECHNIQUE: Contiguous axial images were obtained from the base of the skull through the vertex without intravenous contrast. COMPARISON:  10/29/2020 FINDINGS: Brain: No evidence of acute infarction, hemorrhage, hydrocephalus, extra-axial collection or mass lesion/mass effect. Atrophy and chronic small-vessel white matter ischemic changes are again noted. Vascular: Carotid and vertebral atherosclerotic calcifications are noted. Skull: Normal. Negative for fracture or focal lesion. Sinuses/Orbits: No acute finding. Other: None. IMPRESSION: 1. No evidence of acute intracranial abnormality. 2. Atrophy and chronic small-vessel white matter ischemic changes. Electronically Signed   By: Margarette Canada M.D.   On: 02/14/2021 11:51   DG Chest Port 1 View  Result Date: 02/15/2021 CLINICAL DATA:  Intubated EXAM: PORTABLE CHEST 1 VIEW COMPARISON:  Chest radiograph from one day prior. FINDINGS: Endotracheal tube tip is 0.7 cm above the carina. Enteric tube terminates in body of the stomach. Right internal jugular central venous catheter terminates at the cavoatrial junction. Stable cardiomediastinal silhouette with top-normal heart size. No pneumothorax. No pleural effusion. Low lung volumes. No pulmonary edema. Similar mild bibasilar atelectasis. IMPRESSION: 1. Endotracheal tube tip is 0.7 cm above the carina, consider retracting 1-2 cm. 2. Low lung volumes with stable mild bibasilar atelectasis. These results will be called to the ordering clinician or representative by the Radiologist Assistant, and communication documented in the PACS or [employer]. Electronically Signed   By: Ilona Sorrel M.D.   On: 02/15/2021 07:15   DG Chest Port 1 View  Result Date: 02/14/2021 CLINICAL DATA:  Central line placement  EXAM: PORTABLE CHEST 1 VIEW COMPARISON:  02/14/2021, 10:30 a.m. FINDINGS: Interval retraction of right neck vascular catheter, tip projecting near the superior cavoatrial junction. Endotracheal tube has been advanced, tip now projecting approximately 1 cm above the carina. Esophagogastric tube remains with tip and side port below the diaphragm. Otherwise low volume AP examination with mild cardiomegaly and diffuse bilateral interstitial opacity, unchanged. IMPRESSION: 1. Interval retraction of right neck vascular catheter, tip projecting near the superior cavoatrial junction. 2. Endotracheal tube has been advanced, tip now projecting approximately 1 cm above the carina. Consider slight retraction. 3. Esophagogastric tube remains with tip and side port below the diaphragm. Electronically Signed   By: Eddie Candle M.D.   On: 02/14/2021 15:13   DG Chest Portable 1 View  Result Date: 02/14/2021 CLINICAL DATA:  Central line placement. EXAM: PORTABLE CHEST 1 VIEW COMPARISON:  02/14/2021 and prior studies FINDINGS: A RIGHT IJ central venous catheter is noted with tip overlying  the INFERIOR cavoatrial junction. Endotracheal tube and NG tube again noted. Cardiomediastinal silhouette is unchanged. This is a low volume study with mild LEFT basilar atelectasis. No pneumothorax. IMPRESSION: RIGHT IJ central venous catheter with tip overlying the INFERIOR cavoatrial junction-consider readjustment as indicated. No pneumothorax. Electronically Signed   By: Margarette Canada M.D.   On: 02/14/2021 10:41   DG Chest Portable 1 View  Result Date: 02/14/2021 CLINICAL DATA:  Endotracheal tube placement EXAM: PORTABLE CHEST 1 VIEW COMPARISON:  02/14/2021, 12:03 a.m. FINDINGS: Interval placement of endotracheal tube, tip projecting above the carina. Interval placement of esophagogastric tube, tip and side port below the diaphragm. Mild, diffuse interstitial pulmonary opacity. No new or focal airspace opacity. The heart and mediastinum are  unremarkable. IMPRESSION: 1. Interval placement of endotracheal tube, tip projecting above the carina. 2. Interval placement of esophagogastric tube, tip and side port below the diaphragm. Electronically Signed   By: Eddie Candle M.D.   On: 02/14/2021 09:33   DG Chest Port 1 View  Result Date: 02/14/2021 CLINICAL DATA:  Shortness of breath EXAM: PORTABLE CHEST 1 VIEW COMPARISON:  Chest CT January 07, 2021 in chest radiograph January 07, 2021. FINDINGS: The heart size and mediastinal contours are unchanged. Low lung volumes with bibasilar subsegmental atelectasis. No significant pleural effusion or visible pneumothorax. Degenerative change of the left shoulder with osteochondral bodies and findings of avascular necrosis. IMPRESSION: Low lung volumes with bibasilar subsegmental atelectasis. Electronically Signed   By: Dahlia Bailiff MD   On: 02/14/2021 00:21       Subjective: Patient seen and examined at bedside.  Poor historian.  No overnight fever, vomiting reported.  Discharge Exam: Vitals:   02/24/21 0457 02/24/21 0736  BP: 140/65 (!) 143/78  Pulse: 81 81  Resp: 16 17  Temp: 98.3 F (36.8 C) 98 F (36.7 C)  SpO2: 97% 97%    General exam: Still on room air currently.  No acute distress.  Looks chronically ill. Respiratory system: Decreased breath sounds at bases bilaterally with some crackles Cardiovascular system: Rate controlled, S1-S2 heard Gastrointestinal system: Abdomen is obese, slightly distended, soft and nontender.  Bowel sounds are heard  extremities: Mild lower extremity edema present; no clubbing  Central nervous system: Extremely poor historian; alert.  Still very slow to respond.  No focal neurological deficits.  Moves extremities Skin: No obvious ecchymosis/rash Psychiatry: Affect is flat     The results of significant diagnostics from this hospitalization (including imaging, microbiology, ancillary and laboratory) are listed below for reference.      Microbiology: Recent Results (from the past 240 hour(s))  Culture, blood (routine x 2)     Status: None   Collection Time: 02/15/21  1:50 AM   Specimen: BLOOD  Result Value Ref Range Status   Specimen Description BLOOD RIGHT ANTECUBITAL  Final   Special Requests   Final    BOTTLES DRAWN AEROBIC ONLY Blood Culture results may not be optimal due to an excessive volume of blood received in culture bottles   Culture   Final    NO GROWTH 5 DAYS Performed at [facility], [place] [address], [place], [place] [address]    Report Status 02/20/2021 FINAL  Final  Culture, blood (routine x 2)     Status: None   Collection Time: 02/15/21  2:03 AM   Specimen: BLOOD  Result Value Ref Range Status   Specimen Description BLOOD RIGHT ANTECUBITAL  Final   Special Requests   Final  BOTTLES DRAWN AEROBIC ONLY Blood Culture results may not be optimal due to an excessive volume of blood received in culture bottles   Culture   Final    NO GROWTH 5 DAYS Performed at [place] [address] [place], [place] [address]    Report Status 02/20/2021 FINAL  Final  SARS CORONAVIRUS 2 (TAT 6-24 HRS) Nasopharyngeal Nasopharyngeal Swab     Status: None   Collection Time: 02/22/21  3:18 PM   Specimen: Nasopharyngeal Swab  Result Value Ref Range Status   SARS Coronavirus 2 NEGATIVE NEGATIVE Final    Comment: (NOTE) SARS-CoV-2 target nucleic acids are NOT DETECTED.  The SARS-CoV-2 RNA is generally detectable in upper and lower respiratory specimens during the acute phase of infection. Negative results do not preclude SARS-CoV-2 infection, do not rule out co-infections with other pathogens, and should not be used as the sole basis for treatment or other patient management decisions. Negative results must be combined with clinical observations, patient history, and epidemiological information. The expected result is Negative.  Fact Sheet for  Patients: SugarRoll.be  Fact Sheet for Healthcare Providers: https://www.woods-mathews.com/  This test is not yet approved or cleared by the [place] FDA and  has been authorized for detection and/or diagnosis of SARS-CoV-2 by FDA under an Emergency Use Authorization (EUA). This EUA will remain  in effect (meaning this test can be used) for the duration of the COVID-19 declaration under Se ction 564(b)(1) of the Act, 21 U.S.C. section 360bbb-3(b)(1), unless the authorization is terminated or revoked sooner.  Performed at [facility], [place] [address], [place], [place] [address]      Labs: BNP (last 3 results) Recent Labs    02/14/21 0100  BNP 601.0*   Basic Metabolic Panel: Recent Labs  Lab 02/18/21 0237 02/19/21 0126 02/19/21 0518 02/21/21 0616 02/21/21 1801 02/22/21 1334 02/24/21 0604  NA 140 141 142  --  138 138 135  K 4.2 3.1* 3.4* 4.3 3.0* 3.1* 3.9  CL 104 101 102  --  100 101 103  CO2 28 30 31   --  29 29 27   GLUCOSE 129* 123* 126*  --  139* 128* 126*  BUN 26* 23 23  --  12 15 14   CREATININE 0.69 0.76 0.77 0.75 0.70 0.70 0.73  CALCIUM 9.0 9.4 9.3  --  9.5 9.3 9.3  MG 2.0 2.2  --   --  2.1  --  2.1  PHOS  --   --   --   --  3.3  --   --    Liver Function Tests: Recent Labs  Lab 02/21/21 1801 02/22/21 1334  AST 45* 32  ALT 77* 64*  ALKPHOS 57 60  BILITOT 0.6 0.5  PROT 5.8* 5.9*  ALBUMIN 2.8* 2.7*   No results for input(s): LIPASE, AMYLASE in the last 168 hours. No results for input(s): AMMONIA in the last 168 hours. CBC: Recent Labs  Lab 02/18/21 0237 02/19/21 0126 02/21/21 1801 02/24/21 0604  WBC 12.5* 12.4* 12.5* 9.8  NEUTROABS  --   --   --  6.3  HGB 9.0* 9.9* 9.7* 9.9*  HCT 27.2* 30.3* 30.2* 31.0*  MCV 95.8 93.8 94.7 93.9  PLT 310 356 437* 398   Cardiac Enzymes: No results for input(s): CKTOTAL, CKMB, CKMBINDEX, TROPONINI in the last 168 hours. BNP: Invalid input(s):  POCBNP CBG: Recent Labs  Lab 02/18/21 2323 02/19/21 0317 02/19/21 0805 02/19/21 1126 02/19/21 1537  GLUCAP 130* 113* 112*  130* 136*   D-Dimer No results for input(s): DDIMER in the last 72 hours. Hgb A1c No results for input(s): HGBA1C in the last 72 hours. Lipid Profile No results for input(s): CHOL, HDL, LDLCALC, TRIG, CHOLHDL, LDLDIRECT in the last 72 hours. Thyroid function studies No results for input(s): TSH, T4TOTAL, T3FREE, THYROIDAB in the last 72 hours.  Invalid input(s): FREET3 Anemia work up No results for input(s): VITAMINB12, FOLATE, FERRITIN, TIBC, IRON, RETICCTPCT in the last 72 hours. Urinalysis    Component Value Date/Time   COLORURINE AMBER (A) 02/14/2021 1220   APPEARANCEUR CLOUDY (A) 02/14/2021 1220   LABSPEC 1.021 02/14/2021 1220   PHURINE 5.0 02/14/2021 1220   GLUCOSEU NEGATIVE 02/14/2021 1220   HGBUR NEGATIVE 02/14/2021 1220   BILIRUBINUR NEGATIVE 02/14/2021 1220   KETONESUR 5 (A) 02/14/2021 1220   PROTEINUR 100 (A) 02/14/2021 1220   NITRITE NEGATIVE 02/14/2021 1220   LEUKOCYTESUR SMALL (A) 02/14/2021 1220   Sepsis Labs Invalid input(s): PROCALCITONIN,  WBC,  LACTICIDVEN Microbiology Recent Results (from the past 240 hour(s))  Culture, blood (routine x 2)     Status: None   Collection Time: 02/15/21  1:50 AM   Specimen: BLOOD  Result Value Ref Range Status   Specimen Description BLOOD RIGHT ANTECUBITAL  Final   Special Requests   Final    BOTTLES DRAWN AEROBIC ONLY Blood Culture results may not be optimal due to an excessive volume of blood received in culture bottles   Culture   Final    NO GROWTH 5 DAYS Performed at [facility], [place] [address] [place], [place] [address]    Report Status 02/20/2021 FINAL  Final  Culture, blood (routine x 2)     Status: None   Collection Time: 02/15/21  2:03 AM   Specimen: BLOOD  Result Value Ref Range Status   Specimen Description BLOOD RIGHT ANTECUBITAL  Final   Special Requests   Final     BOTTLES DRAWN AEROBIC ONLY Blood Culture results may not be optimal due to an excessive volume of blood received in culture bottles   Culture   Final    NO GROWTH 5 DAYS Performed at [facility], [place] [address], [place], [place] [address]    Report Status 02/20/2021 FINAL  Final  SARS CORONAVIRUS 2 (TAT 6-24 HRS) Nasopharyngeal Nasopharyngeal Swab     Status: None   Collection Time: 02/22/21  3:18 PM   Specimen: Nasopharyngeal Swab  Result Value Ref Range Status   SARS Coronavirus 2 NEGATIVE NEGATIVE Final    Comment: (NOTE) SARS-CoV-2 target nucleic acids are NOT DETECTED.  The SARS-CoV-2 RNA is generally detectable in upper and lower respiratory specimens during the acute phase of infection. Negative results do not preclude SARS-CoV-2 infection, do not rule out co-infections with other pathogens, and should not be used as the sole basis for treatment or other patient management decisions. Negative results must be combined with clinical observations, patient history, and epidemiological information. The expected result is Negative.  Fact Sheet for Patients: SugarRoll.be  Fact Sheet for Healthcare Providers: [email or web address]-mathews.com/  This test is not yet approved or cleared by the [place] FDA and  has been authorized for detection and/or diagnosis of SARS-CoV-2 by FDA under an Emergency Use Authorization (EUA). This EUA will remain  in effect (meaning this test can be used) for the duration of the COVID-19 declaration under Se ction 564(b)(1) of the Act, 21 U.S.C. section 360bbb-3(b)(1), unless the authorization is terminated or revoked sooner.  Performed at [facility], [place] [address], [place], [place] [address]      Time coordinating discharge: 35 minutes  SIGNED:   Aline August, MD  [facility] 02/24/2021, 1:21 PM

## 2021-02-24 NOTE — Progress Notes (Signed)
Patient ID: DENO SIDA, male   DOB: December 28, 1940, 80 y.o.   MRN: [id number]  PROGRESS NOTE    Jorge Sherman  YJE:[id number] DOB: 03-27-1941 DOA: 02/13/2021 PCP: Merrilee Seashore, MD   Brief Narrative:  80 year old male with history of hypertension, chronic pain, chronic depression, resident at a long-term facility presented with hypoxia with oxygen saturation in the 80s.  He was given Narcan x2: He responded briefly. He has been a DNR in the past however discussions with family led to reversal of his CODE STATUS to full code. Patient was placed on a Narcan drip. He was found to be hypercarbic with an elevated PCO2.  He started vomiting while on BiPAP. There was definite concern for aspiration.  He subsequently was intubated and admitted to ICU.  During the hospitalization, he was extubated and TR[facility] assumed care on 02/20/2021.  He has completed antibiotic course for aspiration pneumonitis  Assessment & Plan:   Acute hypoxic respiratory failure Aspiration pneumonitis -Patient required intubation on presentation.  He has subsequently been extubated.  Currently on room air.  He has completed 5 days course of Unasyn  Delirium/resolved slurring of speech -CT of the head was unremarkable for any acute intracranial findings.  Physical exam was nonfocal. -Mental status has returned back to baseline  Elevated LFTs -Unclear etiology.  Improving.  Hypokalemia -Improved   leukocytosis -Resolved  Anemia of chronic disease -Probably from chronic illnesses.  Hemoglobin stable.  No signs of bleeding  Dysphagia -Diet as per SLP recommendations.  Continue aspiration precautions  Chronic pain syndrome -At home he was on Suboxone, oxycodone, gabapentin -Home gabapentin has been restarted.  Chronic anxiety/depression -Continue home duloxetine  OSA -CPAP at night  Essential hypertension -BP is currently at goal. -Continue home Norvasc 10 mg daily.  Claritin has been  resumed.  Physical debility/ambulatory dysfunction. -Patient was a long-term resident in the facility prior to presentation.  he does not want to go back to that facility.  Social worker following. -Palliative care consultation for goals of care discussion  DVT prophylaxis: Lovenox Code Status: Full Family Communication: None at bedside Disposition Plan: Status is: Inpatient  Remains inpatient appropriate because:Inpatient level of care appropriate due to severity of illness   Dispo:  Patient From: [place]  Planned Disposition: [place] once bed is available  Medically stable for discharge: Yes    Consultants: PCCM  Procedures: Intubation/extubation  Antimicrobials: Completed 5 days of Unasyn   Subjective: Patient seen and examined at bedside.  Poor historian.  No overnight fever, vomiting reported. Objective: Vitals:   02/23/21 1923 02/23/21 2150 02/24/21 0457 02/24/21 0736  BP: 111/62 134/76 140/65 (!) 143/78  Pulse: 85 80 81 81  Resp: 16  16 17   Temp: 98.6 F (37 C)  98.3 F (36.8 C) 98 F (36.7 C)  TempSrc: Oral  Oral Oral  SpO2: 97% 97% 97% 97%  Weight:      Height:        Intake/Output Summary (Last 24 hours) at 02/24/2021 0746 Last data filed at 02/24/2021 0700 Gross per 24 hour  Intake 600 ml  Output 1107 ml  Net -507 ml   Filed Weights   02/17/21 0342 02/19/21 0500 02/20/21 0400  Weight: 110.6 kg 99.1 kg 103 kg    Examination:  General exam: Still on room air currently.  No acute distress.  Looks chronically ill. Respiratory system: Decreased breath sounds at bases bilaterally with some crackles Cardiovascular system: Rate controlled, S1-S2 heard Gastrointestinal system:  Abdomen is obese, slightly distended, soft and nontender.  Bowel sounds are heard  extremities: Mild lower extremity edema present; no clubbing  Central nervous system: Extremely poor historian; alert.  Still very slow to respond.  No focal  neurological deficits.  Moves extremities Skin: No obvious ecchymosis/rash Psychiatry: Affect is flat    Data Reviewed: I have personally reviewed following labs and imaging studies  CBC: Recent Labs  Lab 02/18/21 0237 02/19/21 0126 02/21/21 1801 02/24/21 0604  WBC 12.5* 12.4* 12.5* 9.8  NEUTROABS  --   --   --  6.3  HGB 9.0* 9.9* 9.7* 9.9*  HCT 27.2* 30.3* 30.2* 31.0*  MCV 95.8 93.8 94.7 93.9  PLT 310 356 437* 883   Basic Metabolic Panel: Recent Labs  Lab 02/18/21 0237 02/19/21 0126 02/19/21 0518 02/21/21 0616 02/21/21 1801 02/22/21 1334 02/24/21 0604  NA 140 141 142  --  138 138 135  K 4.2 3.1* 3.4* 4.3 3.0* 3.1* 3.9  CL 104 101 102  --  100 101 103  CO2 28 30 31   --  29 29 27   GLUCOSE 129* 123* 126*  --  139* 128* 126*  BUN 26* 23 23  --  12 15 14   CREATININE 0.69 0.76 0.77 0.75 0.70 0.70 0.73  CALCIUM 9.0 9.4 9.3  --  9.5 9.3 9.3  MG 2.0 2.2  --   --  2.1  --  2.1  PHOS  --   --   --   --  3.3  --   --    GFR: Estimated Creatinine Clearance: 82.8 mL/min (by C-G formula based on SCr of 0.73 mg/dL). Liver Function Tests: Recent Labs  Lab 02/21/21 1801 02/22/21 1334  AST 45* 32  ALT 77* 64*  ALKPHOS 57 60  BILITOT 0.6 0.5  PROT 5.8* 5.9*  ALBUMIN 2.8* 2.7*   No results for input(s): LIPASE, AMYLASE in the last 168 hours. No results for input(s): AMMONIA in the last 168 hours. Coagulation Profile: No results for input(s): INR, PROTIME in the last 168 hours. Cardiac Enzymes: No results for input(s): CKTOTAL, CKMB, CKMBINDEX, TROPONINI in the last 168 hours. BNP (last 3 results) No results for input(s): PROBNP in the last 8760 hours. HbA1C: No results for input(s): HGBA1C in the last 72 hours. CBG: Recent Labs  Lab 02/18/21 2323 02/19/21 0317 02/19/21 0805 02/19/21 1126 02/19/21 1537  GLUCAP 130* 113* 112* 130* 136*   Lipid Profile: No results for input(s): CHOL, HDL, LDLCALC, TRIG, CHOLHDL, LDLDIRECT in the last 72 hours. Thyroid Function  Tests: No results for input(s): TSH, T4TOTAL, FREET4, T3FREE, THYROIDAB in the last 72 hours. Anemia Panel: No results for input(s): VITAMINB12, FOLATE, FERRITIN, TIBC, IRON, RETICCTPCT in the last 72 hours. Sepsis Labs: Recent Labs  Lab 02/22/21 1334  PROCALCITON <0.10    Recent Results (from the past 240 hour(s))  Urine culture     Status: Abnormal   Collection Time: 02/14/21  9:36 AM   Specimen: Urine, Clean Catch  Result Value Ref Range Status   Specimen Description URINE, CLEAN CATCH  Final   Special Requests   Final    NONE Performed at [facility], [place] [address], [place], [place] [address]    Culture MULTIPLE SPECIES PRESENT, SUGGEST RECOLLECTION (A)  Final   Report Status 02/16/2021 FINAL  Final  Culture, blood (routine x 2)     Status: None   Collection Time: 02/14/21 10:56 AM   Specimen: BLOOD RIGHT HAND  Result  Value Ref Range Status   Specimen Description BLOOD RIGHT HAND  Final   Special Requests   Final    BOTTLES DRAWN AEROBIC ONLY Blood Culture results may not be optimal due to an inadequate volume of blood received in culture bottles   Culture   Final    NO GROWTH 5 DAYS Performed at [facility], [place] [address] [place], [place] [address]    Report Status 02/19/2021 FINAL  Final  MRSA PCR Screening     Status: None   Collection Time: 02/14/21 12:41 PM   Specimen: Nasal Mucosa; Nasopharyngeal  Result Value Ref Range Status   MRSA by PCR NEGATIVE NEGATIVE Final    Comment:        The GeneXpert MRSA Assay (FDA approved for NASAL specimens only), is one component of a comprehensive MRSA colonization surveillance program. It is not intended to diagnose MRSA infection nor to guide or monitor treatment for MRSA infections. Performed at [facility], [place] [address], [place], [place] [address]   Culture, blood (routine x 2)     Status: None   Collection Time: 02/15/21  1:50 AM   Specimen: BLOOD  Result Value Ref Range Status    Specimen Description BLOOD RIGHT ANTECUBITAL  Final   Special Requests   Final    BOTTLES DRAWN AEROBIC ONLY Blood Culture results may not be optimal due to an excessive volume of blood received in culture bottles   Culture   Final    NO GROWTH 5 DAYS Performed at [facility], [place] [address], [place], [place] [address]    Report Status 02/20/2021 FINAL  Final  Culture, blood (routine x 2)     Status: None   Collection Time: 02/15/21  2:03 AM   Specimen: BLOOD  Result Value Ref Range Status   Specimen Description BLOOD RIGHT ANTECUBITAL  Final   Special Requests   Final    BOTTLES DRAWN AEROBIC ONLY Blood Culture results may not be optimal due to an excessive volume of blood received in culture bottles   Culture   Final    NO GROWTH 5 DAYS Performed at [facility], [place] [address] [place], [place] [address]    Report Status 02/20/2021 FINAL  Final  SARS CORONAVIRUS 2 (TAT 6-24 HRS) Nasopharyngeal Nasopharyngeal Swab     Status: None   Collection Time: 02/22/21  3:18 PM   Specimen: Nasopharyngeal Swab  Result Value Ref Range Status   SARS Coronavirus 2 NEGATIVE NEGATIVE Final    Comment: (NOTE) SARS-CoV-2 target nucleic acids are NOT DETECTED.  The SARS-CoV-2 RNA is generally detectable in upper and lower respiratory specimens during the acute phase of infection. Negative results do not preclude SARS-CoV-2 infection, do not rule out co-infections with other pathogens, and should not be used as the sole basis for treatment or other patient management decisions. Negative results must be combined with clinical observations, patient history, and epidemiological information. The expected result is Negative.  Fact Sheet for Patients: SugarRoll.be  Fact Sheet for Healthcare Providers: https://www.woods-mathews.com/  This test is not yet approved or cleared by the [place] FDA and  has been authorized for detection and/or  diagnosis of SARS-CoV-2 by FDA under an Emergency Use Authorization (EUA). This EUA will remain  in effect (meaning this test can be used) for the duration of the COVID-19 declaration under Se ction 564(b)(1) of the Act, 21 U.S.C. section 360bbb-3(b)(1), unless the authorization is terminated or revoked  sooner.  Performed at [facility], [place] [address], [place], [place] [address]          Radiology Studies: No results found.      Scheduled Meds: . amLODipine  10 mg Oral Daily  . atorvastatin  80 mg Oral Daily  . celecoxib  200 mg Oral Daily  . chlorhexidine gluconate (MEDLINE KIT)  15 mL Mouth Rinse BID  . Chlorhexidine Gluconate Cloth  6 each Topical Daily  . cloNIDine  0.1 mg Oral BID  . DULoxetine  30 mg Oral BID  . enoxaparin (LOVENOX) injection  50 mg Subcutaneous Q24H  . ferrous sulfate  325 mg Oral Q breakfast  . folic acid  1 mg Oral Daily  . gabapentin  700 mg Oral TID  . lidocaine  1 patch Transdermal Q24H  . multivitamin with minerals  1 tablet Oral Daily  . pantoprazole  40 mg Oral Daily  . polyethylene glycol  17 g Oral BID  . Ensure Max Protein  11 oz Oral BID  . senna-docusate  1 tablet Oral BID  . sodium chloride flush  10-40 mL Intracatheter Q12H  . tamsulosin  0.4 mg Oral QPC supper  . thiamine  100 mg Oral Daily   Or  . thiamine  100 mg Intravenous Daily   Continuous Infusions: . sodium chloride Stopped (02/15/21 1055)          Aline August, MD [facility] 02/24/2021, 7:46 AM

## 2021-02-24 NOTE — Progress Notes (Signed)
Physical Therapy Treatment Patient Details Name: Jorge Sherman MRN: [id number] DOB: 1940-12-20 Today's Date: 02/24/2021    History of Present Illness Pt adm 3/26 from SNF with acute hypoxic respiratory failure with likely aspiration PNA after vomiting. Bilious vomiting likely due to chronic opiate use due to chronic pain. Narcan given at facility. Pt intubated 3/27-3/31. PMH - chronic pain, multiple back surgeries, HTN, DM, depression.    PT Comments    Pt much improved cognitively and functionally this session. Pt alert, eager to get up OOB, and initiated all movements. Pt was able to complete 5 sit to stands into stedy and go into bathroom to have BM and urinate with assist of 1 person. Attempted to march in place however pt unable due to "my knees are bad, and I haven't been up in 10 days." Pt demonstrating excellent rehab potential and is more than appropriate for SNF upon d/c to achieve PLOF. Pt was totalAX2 to EOB on Saturday and is now modAX1 into standing and is able to tolerate activity. Pt demos capability to achieve PLOF with appropriate SNF rehab plan s/p d/c. Acute PT to cont to follow.    Follow Up Recommendations  SNF     Equipment Recommendations       Recommendations for Other Services       Precautions / Restrictions Precautions Precautions: Fall Precaution Comments: "bad knees" Restrictions Weight Bearing Restrictions: No    Mobility  Bed Mobility Overal bed mobility: Needs Assistance Bed Mobility: Rolling;Sidelying to Sit Rolling: Min assist Sidelying to sit: Min assist       General bed mobility comments: pt eager to transfer to EOB, pt able to bring LEs off EOB, pulled up using bed rail and was able to push up from R elbow to push into sitting EOB, minA for safety to assist with inititiation of trunk elevation/getting up on elbow    Transfers Overall transfer level: Needs assistance Equipment used:  (stedy) Transfers: Sit to/from [employer] Sit to Stand: Mod assist Stand pivot transfers:  (used stedy)       General transfer comment: pt able to pull up on stedy with modA for intitial power up, completed 4 sit to stand from lower surface, 3 from stedy seat only requiring minA for safety, attempted to have pt march in place in stedy, pt unable at this time due to "I am weak, dang I am weak."  Ambulation/Gait             General Gait Details: unable at this time however will trial next session   Stairs             Wheelchair Mobility    Modified Rankin (Stroke Patients Only)       Balance Overall balance assessment: Needs assistance Sitting-balance support: Feet supported;No upper extremity supported Sitting balance-Leahy Scale: Good     Standing balance support: Bilateral upper extremity supported Standing balance-Leahy Scale: Zero Standing balance comment: dependent on stedy                            Cognition Arousal/Alertness: Awake/alert Behavior During Therapy: WFL for tasks assessed/performed Overall Cognitive Status: Within Functional Limits for tasks assessed                                 General Comments: pt A&Ox3, pt able to follow all commands, pt able  to state need to use bathroom, pt giving valliant effort t/o entire session, pt much improved from initial eval      Exercises      General Comments General comments (skin integrity, edema, etc.): SpO2 >100% on RA, pt assisted to the bathroom using stedy. Pt with small bowel movement, dependent for hygiene      Pertinent Vitals/Pain Pain Assessment: No/denies pain    Home Living Family/patient expects to be discharged to:: Skilled nursing facility                    Prior Function            PT Goals (current goals can now be found in the care plan section) Acute Rehab PT Goals Patient Stated Goal: get out of here Progress towards PT goals: Progressing toward goals     Frequency    Min 3X/week      PT Plan Frequency needs to be updated    Co-evaluation              AM-PAC PT "6 Clicks" Mobility   Outcome Measure  Help needed turning from your back to your side while in a flat bed without using bedrails?: A Little Help needed moving from lying on your back to sitting on the side of a flat bed without using bedrails?: A Little Help needed moving to and from a bed to a chair (including a wheelchair)?: A Little Help needed standing up from a chair using your arms (e.g., wheelchair or bedside chair)?: A Lot Help needed to walk in hospital room?: A Lot Help needed climbing 3-5 steps with a railing? : Total 6 Click Score: 14    End of Session Equipment Utilized During Treatment: Gait belt Activity Tolerance: Patient tolerated treatment well Patient left: in chair;with call bell/phone within reach;with chair alarm set Nurse Communication: Mobility status;Need for lift equipment PT Visit Diagnosis: Unsteadiness on feet (R26.81);Muscle weakness (generalized) (M62.81)     Time: 3785-8850 PT Time Calculation (min) (ACUTE ONLY): 29 min  Charges:  $Therapeutic Activity: 23-37 mins                     Kittie Plater, PT, DPT Acute Rehabilitation Services Pager #: [phone] Office #: [phone]    Berline Lopes 02/24/2021, 11:44 AM

## 2021-02-24 NOTE — TOC Transition Note (Signed)
Transition of Care [facility]) - CM/SW Discharge Note   Patient Details  Name: Jorge Sherman MRN: [id number] Date of Birth: [birth year]  [facility] CM/SW Contact:  Bethann Berkshire, [place] Phone Number: 02/24/2021, 2:05 PM   Clinical Narrative:     CSW received length texts from daughter explaining her efforts to appeal and that she was told that pt still had rehab sessions still approved. CSW texted daughter and explained he would follow up once clarifying the situation with [place].   CSW spoke with Tressa Busman at [facility]; he was able to clarify with pt's [employer] that they approved SNF request (room/board + rehab) because pt was considered LTC (under medicaid/medicaid pending) at [facility] and using part B medicare benefits only for physical therapy. Tressa Busman confirmed pt can return as part B is still approved for physical therapy sessions and they can take pt back with medicaid pending to "cover" for room and board.   CSW notified pt who is happy and agreeable with this result. CSW spoke with pt bedside with [name] on speaker phone. Both confirm the plan.   Patient will DC to: [facility] Anticipated DC date: 02/24/21 Family notified: Daughter Armed forces operational officer by: Corey Harold   Per MD patient ready for DC to [facility]. RN, patient, patient's family, and facility notified of DC. Discharge Summary and FL2 sent to facility. RN to call report prior to discharge [phone]). DC packet on chart. Ambulance transport requested for patient.   CSW will sign off for now as social work intervention is no longer needed. Please consult [place] again if new needs arise.   Final next level of care: [place] Barriers to Discharge: No Barriers Identified   Patient Goals and CMS Choice Patient states their goals for this hospitalization and ongoing recovery are:: Pt and pt daughter want pt to return to [facility].gov Compare Post Acute Care list provided to:: Patient Choice offered  to / list presented to : Adult Children  Discharge Placement              Patient chooses bed at: [facility] Patient to be transferred to facility by: [place] Name of family member notified: Pt daughter [name] Patient and family notified of of transfer: 02/24/21  Discharge Plan and Services                                     Social Determinants of Health (SDOH) Interventions     Readmission Risk Interventions Readmission Risk Prevention Plan 11/17/2020 06/29/2020  Transportation Screening Complete Complete  Home Care Screening - Complete  Medication Review (RN CM) - Complete  Medication Review Press photographer) Complete -  PCP or Specialist appointment within 3-5 days of discharge Complete -  SW Recovery Care/Counseling Consult Complete -  [place] Complete -  Some recent data might be hidden

## 2021-02-25 NOTE — Consult Note (Signed)
Consultation Note Date: 02/25/2021   Patient Name: Jorge Sherman  DOB: 12-17-1940  MRN: [id number]  Age / Sex: 80 y.o., male  PCP: Merrilee Seashore, MD Referring Physician: No att. providers found  Reason for Consultation: Establishing goals of care  HPI/Patient Profile: 80 y.o. male  with past medical history of hypertension, chronic pain, chronic depression, resident at long-term facility admitted on 02/13/2021 with hypoxia and oxygen saturation in the 80s.  He responded to Narcan.  Previously, he had been listed as DNR but this was revoked at time of presentation to the ED.  He was intubated due to concern for aspiration but was successfully extubated and working to transition to skilled facility for continuation of rehab.  Palliative consulted for goals of care..   Clinical Assessment and Goals of Care: I saw and examined Mr. Jorge Sherman today.  He is awake, alert, and pleasant in conversation.  Mr. Jorge Sherman reports that his family and his faith are the most important thing to him.  He has 4 children and 4 grandchildren.  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  He has been at [facility] prior to this admission and plan is to discharge back there for continuation of rehab.  Eventually, goal is to get the [facility] for assisted living as he has several friends that live there as well.  We discussed his clinical course this hospitalization including intubation and mechanical ventilation support.  He tells me that he is 80 years old and knows that he continues to have multiple comorbidities and that, "something is going to get worse at some point."  We talked about his desires for care moving forward and he reports that he had previously established DNR, however, this was changed on his admission to the hospital and he was successfully  intubated, cared for, and extubated and now is going back to rehab.  He feels very lucky with this and is very appreciative of the care he has received.  He is currently torn on his wishes moving forward as he had previously been very firm in his decision for no intubation or CPR in the event of respiratory or circulatory arrest, however, as he has now successfully survived intubation and is going to be able to be discharged and, "reasonably good" condition, he feels that this is something that he would consider undergoing again.  He endorses plan to continue with full code/full scope treatment now and he will continue to discuss with his family after discharge.  I called and spoke with his daughter, Jorge Sherman, as well.  Jorge Sherman is adamant that she would like for her father to remain full code/full scope treatment moving forward.  She expresses concern as he came into the hospital due to sedation related to change in his pain medication and that if he had remained DNR/DNI he would have died despite having reversible illness.  She expresses understanding that her father would not want to be maintained on life support  long-term, however, she feels that continued aggressive care in an emergency would be in his best interest while they are working to determine if he has reversible illness.  She also expresses understanding that this wish may change at some point as his condition continues to decline, however, she feels as though he is improving and has a good chance of meeting his goal of getting to [place] to live at assisted living where he has many friends.  Jorge Sherman expressly asked me to ensure that he did not have any DNR paperwork on his chart.    SUMMARY OF RECOMMENDATIONS   -Full code/full scope -Plan for transition back to skilled facility for continuation of rehab.  Eventual goal is to get to [facility] for assisted living. -While he would not want long-term life support, he would be open to trial of  aggressive interventions if needed.  I did confirm that there was not a durable DNR on his discharge chart.  Code Status/Advance Care Planning: Full code   Prognosis:   > 12 months  Discharge Planning: [facility] for rehab with Palliative care service follow-up      Primary Diagnoses: Present on Admission: . Acute respiratory failure with hypercapnia ([place]) . Chronic pain syndrome . Bilious emesis . Acute hypercapnic respiratory failure ([place])   I have reviewed the medical record, interviewed the patient and family, and examined the patient. The following aspects are pertinent.  Past Medical History:  Diagnosis Date  . Chronic pain   . Depressive disorder, not elsewhere classified   . Dizziness and giddiness   . Essential and other specified forms of tremor   . Headache(784.0)   . Leukocytosis, unspecified   . Obstructive sleep apnea (adult) (pediatric)   . Pain in joint, lower leg   . Septic joint of left knee joint ([place]) 04/22/2019  . Spinal stenosis, lumbar region, without neurogenic claudication   . Thoracic or lumbosacral neuritis or radiculitis, unspecified   . Type II or unspecified type diabetes mellitus without mention of complication, not stated as uncontrolled   . Unspecified cardiovascular disease   . Unspecified essential hypertension    Social History   Socioeconomic History  . Marital status: Legally Separated    Spouse name: Jorge Sherman  . Number of children: 4  . Years of education: Not on file  . Highest education level: Not on file  Occupational History    Employer: DISABLED  Tobacco Use  . Smoking status: Former Research scientist (life sciences)  . Smokeless tobacco: Never Used  Substance and Sexual Activity  . Alcohol use: No  . Drug use: No  . Sexual activity: Not Currently  Other Topics Concern  . Not on file  Social History Narrative   ** Merged History Encounter **       Patient lives at home with wife.    Patient has 4 children.         Social  Determinants of Health   Financial Resource Strain: Not on file  Food Insecurity: Not on file  Transportation Needs: Not on file  Physical Activity: Not on file  Stress: Not on file  Social Connections: Not on file   Family History  Problem Relation Age of Onset  . Cancer Father   . Heart disease Father   . Stroke Mother   . Diabetes Mother    Scheduled Meds: Continuous Infusions: PRN Meds:. Medications Prior to Admission:  Prior to Admission medications   Medication Sig Start Date End Date Taking? Authorizing Provider  acetaminophen (TYLENOL) 500 MG tablet Take 2 tablets (1,000 mg total) by mouth every 6 (six) hours as needed for headache, mild pain or fever. 11/28/20  Yes Dwyane Dee, MD  celecoxib (CELEBREX) 200 MG capsule Take 200 mg by mouth daily.   Yes [provider]  diclofenac Sodium (VOLTAREN) 1 % GEL Apply 2 g topically 4 (four) times daily as needed for pain. 01/02/20  Yes [provider]  gabapentin (NEURONTIN) 300 MG capsule Take 1 capsule (300 mg total) by mouth 3 (three) times daily. Patient taking differently: Take 600 mg by mouth 3 (three) times daily. (Take with 100mg  to equal a total dose of 700mg .) 11/28/20  Yes Dwyane Dee, MD  hydroxychloroquine (PLAQUENIL) 200 MG tablet Take 200 mg by mouth 2 (two) times daily.   Yes [provider]  lidocaine (LIDODERM) 5 % Place 1 patch onto the skin daily. Remove & Discard patch within 12 hours or as directed by MD. Right chest Patient taking differently: Place 1 patch onto the skin daily. Remove & Discard patch within 12 hours or as directed by MD. Apply one patch to each knee. 11/03/20  Yes Pokhrel, Laxman, MD  Menthol, Topical Analgesic, (BIOFREEZE EX) Apply 1 application topically daily. To back of neck and lower back.   Yes [provider]  SUMAtriptan (IMITREX) 25 MG tablet Take 25 mg by mouth daily as needed for migraine. May repeat in 2 hours if headache persists or recurs.   Yes  [provider]  tamsulosin (FLOMAX) 0.4 MG CAPS capsule Take 0.4 mg by mouth daily.   Yes [provider]  albuterol (VENTOLIN HFA) 108 (90 Base) MCG/ACT inhaler Inhale 2 puffs into the lungs every 4 (four) hours as needed for wheezing or shortness of breath. 11/03/20   Pokhrel, Corrie Mckusick, MD  amLODipine (NORVASC) 10 MG tablet Take 1 tablet (10 mg total) by mouth daily. 11/29/20   Dwyane Dee, MD  atorvastatin (LIPITOR) 80 MG tablet Take 80 mg by mouth daily. 12/14/20   [provider]  cloNIDine (CATAPRES) 0.1 MG tablet Take 0.1 mg by mouth 2 (two) times daily.     [provider]  DULoxetine (CYMBALTA) 30 MG capsule Take 1 capsule (30 mg total) by mouth 2 (two) times daily. 02/24/21   Aline August, MD  ferrous sulfate 325 (65 FE) MG tablet Take 325 mg by mouth daily.     [provider]  folic acid (FOLVITE) 1 MG tablet Take 1 tablet (1 mg total) by mouth daily. 02/25/21   Aline August, MD  linaclotide (LINZESS) 72 MCG capsule Take 72 mcg by mouth daily as needed (constipation).    [provider]  loperamide (IMODIUM A-D) 2 MG tablet Take 1 tablet (2 mg total) by mouth 4 (four) times daily as needed for diarrhea or loose stools. 07/04/20   Georgette Shell, MD  LORazepam (ATIVAN) 1 MG tablet Take 0.5 tablets (0.5 mg total) by mouth every 8 (eight) hours as needed for anxiety. 02/24/21   Aline August, MD  methocarbamol (ROBAXIN) 500 MG tablet Take 1 tablet (500 mg total) by mouth 2 (two) times daily as needed for muscle spasms (hold for sedation). 02/24/21   Aline August, MD  Multiple Vitamin (MULTIVITAMIN WITH MINERALS) TABS tablet Take 1 tablet by mouth daily. 11/29/20   Dwyane Dee, MD  ondansetron (ZOFRAN) 4 MG tablet Take 1 tablet (4 mg total) by mouth every 6 (six) hours as needed for nausea. 07/04/20   Georgette Shell,  MD  pantoprazole (PROTONIX) 40 MG tablet Take 1 tablet (40 mg total) by mouth daily. 11/29/20   Dwyane Dee, MD   polyethylene glycol (MIRALAX / GLYCOLAX) 17 g packet Take 17 g by mouth daily as needed for mild constipation. 11/03/20   Pokhrel, Corrie Mckusick, MD  rosuvastatin (CRESTOR) 40 MG tablet Take 40 mg by mouth every evening.     [provider]  thiamine 100 MG tablet Take 1 tablet (100 mg total) by mouth daily. 02/25/21   Aline August, MD   Allergies  Allergen Reactions  . Cefepime Rash    Redness  . Iohexol Hives and Other (See Comments)    A few hives post CT  . Fentanyl Itching, Nausea And Vomiting and Other (See Comments)    Patient states that it happened over 30 yrs ago  . Iodine Hives and Rash   Review of Systems  Constitutional: Positive for activity change and fatigue.  Psychiatric/Behavioral: Positive for sleep disturbance.   Physical Exam General: Alert, awake, in no acute distress.  HEENT: No bruits, no goiter, no JVD Heart: Regular rate and rhythm. No murmur appreciated. Lungs: Good air movement, clear Abdomen: Soft, nontender, MILD distended, positive bowel sounds.  Ext: + edema Skin: Warm and dry Neuro: Grossly intact, nonfocal.   Vital Signs: BP 140/80 (BP Location: Left Arm)   Pulse 80   Temp 98.2 F (36.8 C) (Oral)   Resp 17   Ht 5\' 6"  (1.676 m)   Wt 103 kg   SpO2 98%   BMI 36.65 kg/m  Pain Scale: 0-10 POSS *See Group Information*: 1-Acceptable,Awake and alert Pain Score: 0-No pain   SpO2: SpO2: 98 % O2 Device:SpO2: 98 % O2 Flow Rate: .O2 Flow Rate (L/min): 2 L/min  IO: Intake/output summary:   Intake/Output Summary (Last 24 hours) at 02/25/2021 1413 Last data filed at 02/24/2021 1500 Gross per 24 hour  Intake 240 ml  Output --  Net 240 ml    LBM: Last BM Date: 02/23/21 Baseline Weight: Weight: 110 kg Most recent weight: Weight: 103 kg     Palliative Assessment/Data:   Flowsheet Rows   Flowsheet Row Most Recent Value  Intake Tab   Referral Department Hospitalist  Unit at Time of Referral Med/Surg Unit  Palliative Care Primary  Diagnosis Neurology  Date Notified 02/23/21  Palliative Care Type New Palliative care  Reason for referral Clarify Goals of Care  Date of Admission 02/13/21  Date first seen by Palliative Care 02/24/21  # of days Palliative referral response time 1 Day(s)  # of days IP prior to Palliative referral 10  Clinical Assessment   Palliative Performance Scale Score 40%  Psychosocial & Spiritual Assessment   Palliative Care Outcomes   Patient/Family meeting held? Yes  Who was at the meeting? patient, daughter via phone  Palliative Care Outcomes Clarified goals of care      Time In: 1300 Time Out: 1400 Time Total: 60 Greater than 50%  of this time was spent counseling and coordinating care related to the above assessment and plan.  Signed by: Micheline Rough, MD   Please contact Palliative Medicine Team phone at [phone] for questions and concerns.  For individual provider: See Shea Evans

## 2021-03-28 ENCOUNTER — Emergency Department (HOSPITAL_COMMUNITY): Payer: Medicare HMO

## 2021-03-28 ENCOUNTER — Other Ambulatory Visit: Payer: Self-pay

## 2021-03-28 ENCOUNTER — Encounter (HOSPITAL_COMMUNITY): Payer: Self-pay

## 2021-03-28 ENCOUNTER — Emergency Department (HOSPITAL_COMMUNITY)
Admission: EM | Admit: 2021-03-28 | Discharge: 2021-03-28 | Disposition: A | Discharge status: Home / Self Care | Payer: Medicare HMO | Attending: Emergency Medicine | Admitting: Emergency Medicine

## 2021-03-28 DIAGNOSIS — E119 Type 2 diabetes mellitus without complications: Secondary | ICD-10-CM | POA: Diagnosis not present

## 2021-03-28 DIAGNOSIS — I10 Essential (primary) hypertension: Secondary | ICD-10-CM | POA: Insufficient documentation

## 2021-03-28 DIAGNOSIS — Z79899 Other long term (current) drug therapy: Secondary | ICD-10-CM | POA: Insufficient documentation

## 2021-03-28 DIAGNOSIS — G8929 Other chronic pain: Secondary | ICD-10-CM | POA: Diagnosis not present

## 2021-03-28 DIAGNOSIS — M25511 Pain in right shoulder: Secondary | ICD-10-CM | POA: Diagnosis not present

## 2021-03-28 DIAGNOSIS — I2510 Atherosclerotic heart disease of native coronary artery without angina pectoris: Secondary | ICD-10-CM | POA: Diagnosis not present

## 2021-03-28 DIAGNOSIS — Z87891 Personal history of nicotine dependence: Secondary | ICD-10-CM | POA: Diagnosis not present

## 2021-03-28 DIAGNOSIS — M25512 Pain in left shoulder: Secondary | ICD-10-CM | POA: Insufficient documentation

## 2021-03-28 MED ORDER — HYDROCODONE-ACETAMINOPHEN 5-325 MG PO TABS
1.0000 | ORAL_TABLET | Freq: Once | ORAL | Status: DC
  Filled 2021-03-28: qty 1

## 2021-03-28 MED ORDER — OXYCODONE-ACETAMINOPHEN 5-325 MG PO TABS
1.0000 | ORAL_TABLET | Freq: Once | ORAL | Status: AC
  Administered 2021-03-28: 1 via ORAL
  Filled 2021-03-28: qty 1

## 2021-03-28 MED ORDER — MORPHINE SULFATE (PF) 4 MG/ML IV SOLN
4.0000 mg | Freq: Once | INTRAVENOUS | Status: AC
Start: 2021-03-28 — End: 2021-03-28
  Administered 2021-03-28: 4 mg via INTRAMUSCULAR
  Filled 2021-03-28: qty 1

## 2021-03-28 NOTE — ED Notes (Signed)
Pt transported to Xray via stretcher

## 2021-03-28 NOTE — ED Provider Notes (Signed)
[facility] DEPT Provider Note   CSN: [id number] Arrival date & time: 03/28/21  1036     History Chief Complaint  Patient presents with  . Shoulder Injury    Jorge Sherman is a 80 y.o. male.  HPI   Patient presents to the ED for evaluation of bilateral shoulder pain, left greater than right.  Patient was recently in the hospital from March 26 to April 6.  Patient was noted to have aspiration pneumonitis as well as acute hypoxic respiratory failure.  Patient also was noted to have history of chronic pain syndrome and was on Suboxone oxycodone and gabapentin .  Patient states he continues to reside at a rehab facility.  He was doing rehab on a machine on Friday and developed worsening pain in both of his shoulders.  Patient states he was told that one of his shoulders may be out of joint and he was sent to the ED.  He complains of pain in both shoulders with any movement.  His left is worse than his right.  He denies any headache or head injury.  No fevers or chills.  No vomiting or diarrhea  Past Medical History:  Diagnosis Date  . Chronic pain   . Depressive disorder, not elsewhere classified   . Dizziness and giddiness   . Essential and other specified forms of tremor   . Headache(784.0)   . Leukocytosis, unspecified   . Obstructive sleep apnea (adult) (pediatric)   . Pain in joint, lower leg   . Septic joint of left knee joint ([place]) 04/22/2019  . Spinal stenosis, lumbar region, without neurogenic claudication   . Thoracic or lumbosacral neuritis or radiculitis, unspecified   . Type II or unspecified type diabetes mellitus without mention of complication, not stated as uncontrolled   . Unspecified cardiovascular disease   . Unspecified essential hypertension     Patient Active Problem List   Diagnosis Date Noted  . Acute respiratory failure with hypercapnia ([place]) 02/14/2021  . Bilious emesis 02/14/2021  . Acute hypercapnic respiratory failure  ([place]) 02/14/2021  . Hyponatremia 11/11/2020  . Falls 10/30/2020  . Dehydration with hyponatremia 10/29/2020  . Fall at home, initial encounter 10/29/2020  . Musculoskeletal chest pain 10/29/2020  . Mixed hyperlipidemia due to type 2 diabetes mellitus ([place]) 10/29/2020  . Benign prostatic hyperplasia without lower urinary tract symptoms 10/29/2020  . Ileus ([place])   . Declining functional status   . Goals of care, counseling/discussion   . Palliative care by specialist   . AKI (acute kidney injury) ([place]) 06/27/2020  . Acute diarrhea 06/27/2020  . Altered mental status 08/28/2019  . Septic joint of left knee joint ([place]) 04/22/2019  . Erectile dysfunction 10/30/2013  . Essential hypertension, benign 10/30/2013  . Coronary artery disease involving native coronary artery of native heart without angina pectoris 10/30/2013  . Dizziness and giddiness   . Pain in joint, lower leg   . Type 2 diabetes mellitus without complication, with long-term current use of insulin ([place])   . Obstructive sleep apnea   . Depressive disorder, not elsewhere classified   . Essential and other specified forms of tremor   . Headache(784.0)   . Spinal stenosis, lumbar region, without neurogenic claudication   . Thoracic or lumbosacral neuritis or radiculitis, unspecified   . Chronic pain syndrome     Past Surgical History:  Procedure Laterality Date  . BACK SURGERY    . back surgey     times 3  .  IR INJECT/THERA/INC NEEDLE/CATH/PLC EPI/LUMB/SAC W/IMG  11/26/2020       Family History  Problem Relation Age of Onset  . Cancer Father   . Heart disease Father   . Stroke Mother   . Diabetes Mother     Social History   Tobacco Use  . Smoking status: Former Research scientist (life sciences)  . Smokeless tobacco: Never Used  Substance Use Topics  . Alcohol use: No  . Drug use: No    Home Medications Prior to Admission medications   Medication Sig Start Date End Date Taking? Authorizing Provider  acetaminophen (TYLENOL) 500 MG  tablet Take 2 tablets (1,000 mg total) by mouth every 6 (six) hours as needed for headache, mild pain or fever. 11/28/20   Dwyane Dee, MD  albuterol (VENTOLIN HFA) 108 (90 Base) MCG/ACT inhaler Inhale 2 puffs into the lungs every 4 (four) hours as needed for wheezing or shortness of breath. 11/03/20   Pokhrel, Corrie Mckusick, MD  amLODipine (NORVASC) 10 MG tablet Take 1 tablet (10 mg total) by mouth daily. 11/29/20   Dwyane Dee, MD  atorvastatin (LIPITOR) 80 MG tablet Take 80 mg by mouth daily. 12/14/20   [provider]  celecoxib (CELEBREX) 200 MG capsule Take 200 mg by mouth daily.    [provider]  cloNIDine (CATAPRES) 0.1 MG tablet Take 0.1 mg by mouth 2 (two) times daily.     [provider]  diclofenac Sodium (VOLTAREN) 1 % GEL Apply 2 g topically 4 (four) times daily as needed for pain. 01/02/20   [provider]  DULoxetine (CYMBALTA) 30 MG capsule Take 1 capsule (30 mg total) by mouth 2 (two) times daily. 02/24/21   Aline August, MD  ferrous sulfate 325 (65 FE) MG tablet Take 325 mg by mouth daily.     [provider]  folic acid (FOLVITE) 1 MG tablet Take 1 tablet (1 mg total) by mouth daily. 02/25/21   Aline August, MD  gabapentin (NEURONTIN) 300 MG capsule Take 1 capsule (300 mg total) by mouth 3 (three) times daily. Patient taking differently: Take 600 mg by mouth 3 (three) times daily. (Take with 100mg  to equal a total dose of 700mg .) 11/28/20   Dwyane Dee, MD  hydroxychloroquine (PLAQUENIL) 200 MG tablet Take 200 mg by mouth 2 (two) times daily.    [provider]  lidocaine (LIDODERM) 5 % Place 1 patch onto the skin daily. Remove & Discard patch within 12 hours or as directed by MD. Right chest Patient taking differently: Place 1 patch onto the skin daily. Remove & Discard patch within 12 hours or as directed by MD. Apply one patch to each knee. 11/03/20   Pokhrel, Corrie Mckusick, MD  linaclotide (LINZESS) 72 MCG capsule Take 72 mcg by mouth  daily as needed (constipation).    [provider]  loperamide (IMODIUM A-D) 2 MG tablet Take 1 tablet (2 mg total) by mouth 4 (four) times daily as needed for diarrhea or loose stools. 07/04/20   Georgette Shell, MD  LORazepam (ATIVAN) 1 MG tablet Take 0.5 tablets (0.5 mg total) by mouth every 8 (eight) hours as needed for anxiety. 02/24/21   Aline August, MD  Menthol, Topical Analgesic, (BIOFREEZE EX) Apply 1 application topically daily. To back of neck and lower back.    [provider]  methocarbamol (ROBAXIN) 500 MG tablet Take 1 tablet (500 mg total) by mouth 2 (two) times daily as needed for muscle spasms (hold for sedation). 02/24/21   Aline August, MD  Multiple Vitamin (MULTIVITAMIN WITH MINERALS) TABS tablet Take 1 tablet by mouth daily. 11/29/20   Dwyane Dee, MD  ondansetron (ZOFRAN) 4 MG tablet Take 1 tablet (4 mg total) by mouth every 6 (six) hours as needed for nausea. 07/04/20   Georgette Shell, MD  pantoprazole (PROTONIX) 40 MG tablet Take 1 tablet (40 mg total) by mouth daily. 11/29/20   Dwyane Dee, MD  polyethylene glycol (MIRALAX / GLYCOLAX) 17 g packet Take 17 g by mouth daily as needed for mild constipation. 11/03/20   Pokhrel, Corrie Mckusick, MD  rosuvastatin (CRESTOR) 40 MG tablet Take 40 mg by mouth every evening.     [provider]  SUMAtriptan (IMITREX) 25 MG tablet Take 25 mg by mouth daily as needed for migraine. May repeat in 2 hours if headache persists or recurs.    [provider]  tamsulosin (FLOMAX) 0.4 MG CAPS capsule Take 0.4 mg by mouth daily.    [provider]  thiamine 100 MG tablet Take 1 tablet (100 mg total) by mouth daily. 02/25/21   Aline August, MD    Allergies    Cefepime, Iohexol, Fentanyl, and Iodine  Review of Systems   Review of Systems  All other systems reviewed and are negative.   Physical Exam Updated Vital Signs BP (!) 175/101 (BP Location: Right Arm)   Pulse 83   Temp 98.4 F (36.9  C) (Oral)   Resp 20   SpO2 98%   Physical Exam Vitals and nursing note reviewed.  Constitutional:      Appearance: He is well-developed. He is not diaphoretic.  HENT:     Head: Normocephalic and atraumatic.     Right Ear: External ear normal.     Left Ear: External ear normal.  Eyes:     General: No scleral icterus.       Right eye: No discharge.        Left eye: No discharge.     Conjunctiva/sclera: Conjunctivae normal.  Neck:     Trachea: No tracheal deviation.  Cardiovascular:     Rate and Rhythm: Normal rate and regular rhythm.  Pulmonary:     Effort: Pulmonary effort is normal. No respiratory distress.     Breath sounds: Normal breath sounds. No stridor. No wheezing or rales.  Abdominal:     General: Bowel sounds are normal. There is no distension.     Palpations: Abdomen is soft.     Tenderness: There is no abdominal tenderness. There is no guarding or rebound.  Musculoskeletal:        General: Tenderness present.     Cervical back: Neck supple.     Comments: Tenderness palpation bilateral shoulders, no erythema or deformity noted, pain with range of motion of the left and right shoulder, no elbow erythema or edema bilaterally, distal neurovascular intact  Skin:    General: Skin is warm and dry.     Findings: No rash.  Neurological:     Mental Status: He is alert.     Cranial Nerves: No cranial nerve deficit (no facial droop, extraocular movements intact, no slurred speech).     Sensory: No sensory deficit.     Motor: No abnormal muscle tone or seizure activity.     Coordination: Coordination normal.     ED Results / Procedures / Treatments   Labs (all labs ordered are listed, but only abnormal results are displayed) Labs Reviewed - No data to display  EKG None  Radiology DG Shoulder Right  Result Date: 03/28/2021 CLINICAL DATA:  Shoulder pain after using an arm exercise machine 2 days ago. EXAM: RIGHT SHOULDER - 2+ VIEW COMPARISON:  Right shoulder  radiographs dated 10/29/2020. FINDINGS: There is no evidence of fracture or dislocation. There is superior subluxation of the humeral head on the glenoid, likely reflecting chronic rotator cuff tear. Moderate to severe chronic degenerative changes are seen of the acromioclavicular and glenohumeral joints. IMPRESSION: Chronic rotator cuff tear and degenerative changes. No acute osseous injury. Electronically Signed   By: Zerita Boers M.D.   On: 03/28/2021 11:56   DG Shoulder Left  Result Date: 03/28/2021 CLINICAL DATA:  Shoulder pain after using an arm exercise machine 2 days ago. EXAM: LEFT SHOULDER - 2+ VIEW COMPARISON:  None. FINDINGS: There is no evidence of fracture or dislocation. Moderate to severe chronic degenerative changes are seen of the acromioclavicular and glenohumeral joints. IMPRESSION: Chronic degenerative changes.  No acute osseous injury. Electronically Signed   By: Zerita Boers M.D.   On: 03/28/2021 11:57    Procedures Procedures   Medications Ordered in ED Medications  morphine 4 MG/ML injection 4 mg (has no administration in time range)  oxyCODONE-acetaminophen (PERCOCET/ROXICET) 5-325 MG per tablet 1 tablet (1 tablet Oral Given 03/28/21 1122)    ED Course  I have reviewed the triage vital signs and the nursing notes.  Pertinent labs & imaging results that were available during my care of the patient were reviewed by me and considered in my medical decision making (see chart for details).  Clinical Course as of 03/28/21 1228  Sun Mar 28, 2021  1055 Patient refused the Vicodin.  He wants strong medications.  Patient was in the hospital for opiate overdose.  We will start with an oxycodone [JK]  1215 X-rays do not show any signs of any acute injury.  Patient does have degenerative changes as well as chronic rotator cuff tears [JK]    Clinical Course User Index [JK] Dorie Rank, MD   MDM Rules/Calculators/A&P                          Patient presented to ED for  evaluation of chronic shoulder pain.  Prior records reviewed.  This has been ongoing issue for the patient.  Previously had been taking Suboxone but was recently admitted with apparent complications from his chronic pain medication use.  Patient states regimen that he is on at the nursing facility has not been affected.  He is requesting opiate pain medications.  Patient was also concerned that he had a dislocation from the recent exercises.  X-rays do not show any acute injuries.  He does have chronic degenerative joint changes as well as findings suggestive of rotator cuff injury.  Patient was given a hydrocodone here in the ED but he continued to have pain.  Patient was given 1 injection of morphine IM.  I will defer further outpatient medication treatment to his primary care or nursing home doctor. Final Clinical Impression(s) / ED Diagnoses Final diagnoses:  Chronic pain in left shoulder  Chronic right shoulder pain    Rx / DC Orders ED Discharge Orders    None       Dorie Rank, MD 03/28/21 1229

## 2021-03-28 NOTE — Discharge Instructions (Signed)
Continue the medications as prescribed by the nursing facility.  Consider following up with a pain management doctor if the symptoms persist

## 2021-03-28 NOTE — ED Notes (Signed)
[facility] called for transport back to [facility] 

## 2021-03-28 NOTE — ED Triage Notes (Signed)
Pt arrived via EMS, from [facility], injured left shoulder during PT on Friday.

## 2021-04-29 ENCOUNTER — Other Ambulatory Visit: Payer: Self-pay

## 2021-04-29 ENCOUNTER — Emergency Department (HOSPITAL_COMMUNITY)
Admission: EM | Admit: 2021-04-29 | Discharge: 2021-04-29 | Disposition: A | Discharge status: Home / Self Care | Payer: Medicare HMO | Attending: Emergency Medicine | Admitting: Emergency Medicine

## 2021-04-29 ENCOUNTER — Emergency Department (HOSPITAL_COMMUNITY): Payer: Medicare HMO

## 2021-04-29 ENCOUNTER — Encounter (HOSPITAL_COMMUNITY): Payer: Self-pay | Admitting: Emergency Medicine

## 2021-04-29 DIAGNOSIS — Z87891 Personal history of nicotine dependence: Secondary | ICD-10-CM | POA: Insufficient documentation

## 2021-04-29 DIAGNOSIS — M25512 Pain in left shoulder: Secondary | ICD-10-CM | POA: Diagnosis not present

## 2021-04-29 DIAGNOSIS — E119 Type 2 diabetes mellitus without complications: Secondary | ICD-10-CM | POA: Insufficient documentation

## 2021-04-29 DIAGNOSIS — I2510 Atherosclerotic heart disease of native coronary artery without angina pectoris: Secondary | ICD-10-CM | POA: Diagnosis not present

## 2021-04-29 DIAGNOSIS — Z79899 Other long term (current) drug therapy: Secondary | ICD-10-CM | POA: Diagnosis not present

## 2021-04-29 DIAGNOSIS — M25511 Pain in right shoulder: Secondary | ICD-10-CM | POA: Diagnosis not present

## 2021-04-29 DIAGNOSIS — I10 Essential (primary) hypertension: Secondary | ICD-10-CM | POA: Insufficient documentation

## 2021-04-29 LAB — CBC WITH DIFFERENTIAL/PLATELET
Abs Immature Granulocytes: 0.13 10*3/uL — ABNORMAL HIGH (ref 0.00–0.07)
Basophils Absolute: 0.1 10*3/uL (ref 0.0–0.1)
Basophils Relative: 1 %
Eosinophils Absolute: 0.4 10*3/uL (ref 0.0–0.5)
Eosinophils Relative: 3 %
HCT: 33.4 % — ABNORMAL LOW (ref 39.0–52.0)
Hemoglobin: 10.7 g/dL — ABNORMAL LOW (ref 13.0–17.0)
Immature Granulocytes: 1 %
Lymphocytes Relative: 10 %
Lymphs Abs: 1.3 10*3/uL (ref 0.7–4.0)
MCH: 29.5 pg (ref 26.0–34.0)
MCHC: 32 g/dL (ref 30.0–36.0)
MCV: 92 fL (ref 80.0–100.0)
Monocytes Absolute: 1.3 10*3/uL — ABNORMAL HIGH (ref 0.1–1.0)
Monocytes Relative: 10 %
Neutro Abs: 9.4 10*3/uL — ABNORMAL HIGH (ref 1.7–7.7)
Neutrophils Relative %: 75 %
Platelets: 305 10*3/uL (ref 150–400)
RBC: 3.63 MIL/uL — ABNORMAL LOW (ref 4.22–5.81)
RDW: 14.5 % (ref 11.5–15.5)
WBC: 12.5 10*3/uL — ABNORMAL HIGH (ref 4.0–10.5)
nRBC: 0 % (ref 0.0–0.2)

## 2021-04-29 LAB — BASIC METABOLIC PANEL
Anion gap: 7 (ref 5–15)
BUN: 18 mg/dL (ref 8–23)
CO2: 25 mmol/L (ref 22–32)
Calcium: 9.2 mg/dL (ref 8.9–10.3)
Chloride: 105 mmol/L (ref 98–111)
Creatinine, Ser: 0.82 mg/dL (ref 0.61–1.24)
GFR, Estimated: >60 mL/min (ref >60)
Glucose, Bld: 115 mg/dL — ABNORMAL HIGH (ref 70–99)
Potassium: 3.9 mmol/L (ref 3.5–5.1)
Sodium: 137 mmol/L (ref 135–145)

## 2021-04-29 LAB — TROPONIN I (HIGH SENSITIVITY): Troponin I (High Sensitivity): 4 ng/L (ref <18)

## 2021-04-29 MED ORDER — METHOCARBAMOL 500 MG PO TABS: 500.0000 mg | ORAL_TABLET | Freq: Once | ORAL | Status: DC

## 2021-04-29 MED ORDER — DICLOFENAC SODIUM 1 % EX GEL: 2.0000 g | Freq: Four times a day (QID) | CUTANEOUS | 0 refills | Status: DC

## 2021-04-29 MED ORDER — OXYCODONE-ACETAMINOPHEN 5-325 MG PO TABS
1.0000 | ORAL_TABLET | Freq: Once | ORAL | Status: AC
  Administered 2021-04-29: 1 via ORAL
  Filled 2021-04-29: qty 1

## 2021-04-29 MED ORDER — DICLOFENAC SODIUM 1 % EX GEL
2.0000 g | Freq: Once | CUTANEOUS | Status: AC
  Administered 2021-04-29: 2 g via TOPICAL
  Filled 2021-04-29: qty 100

## 2021-04-29 MED ORDER — DOXYCYCLINE HYCLATE 100 MG PO CAPS: 100.0000 mg | ORAL_CAPSULE | Freq: Two times a day (BID) | ORAL | 0 refills | Status: AC

## 2021-04-29 MED ORDER — KETOROLAC TROMETHAMINE 30 MG/ML IJ SOLN
30.0000 mg | Freq: Once | INTRAMUSCULAR | Status: AC
  Administered 2021-04-29: 30 mg via INTRAMUSCULAR
  Filled 2021-04-29: qty 1

## 2021-04-29 NOTE — ED Provider Notes (Signed)
[facility] DEPT Provider Note   CSN: [id number] Arrival date & time: 04/29/21  1215     History Chief Complaint  Patient presents with   Shoulder Pain    Jorge Sherman is a 80 y.o. male.  HPI   Pt is an 80 y/o male with a h/o chronic pain, depressive disorder, tremor, HA, OSA, septic joint of the left knee. Spinal stenosis, T2DM, HTN, who presents to the ED today for eval of bilat shoulder pain. States he has a h/o chronic shoulder pain and has known rotator cuff tears. He hurt his shoulders again while using an exercise machine 10 days ago. States the pain is keeping him up at night and he has trouble lifting his arms. He does report he has an orthopedic doctor. He lives at [facility] and states they give him tramadol for pain but he fees like it is not doing anything. He reports he has had some chest pain but none currently. States the pain takes his breath away.   Past Medical History:  Diagnosis Date   Chronic pain    Depressive disorder, not elsewhere classified    Dizziness and giddiness    Essential and other specified forms of tremor    Headache(784.0)    Leukocytosis, unspecified    Obstructive sleep apnea (adult) (pediatric)    Pain in joint, lower leg    Septic joint of left knee joint ([place]) 04/22/2019   Spinal stenosis, lumbar region, without neurogenic claudication    Thoracic or lumbosacral neuritis or radiculitis, unspecified    Type II or unspecified type diabetes mellitus without mention of complication, not stated as uncontrolled    Unspecified cardiovascular disease    Unspecified essential hypertension     Patient Active Problem List   Diagnosis Date Noted   Acute respiratory failure with hypercapnia ([place]) 02/14/2021   Bilious emesis 02/14/2021   Acute hypercapnic respiratory failure ([place]) 02/14/2021   Hyponatremia 11/11/2020   Falls 10/30/2020   Dehydration with hyponatremia 10/29/2020   Fall at home, initial encounter  10/29/2020   Musculoskeletal chest pain 10/29/2020   Mixed hyperlipidemia due to type 2 diabetes mellitus ([place]) 10/29/2020   Benign prostatic hyperplasia without lower urinary tract symptoms 10/29/2020   Ileus ([place])    Declining functional status    Goals of care, counseling/discussion    Palliative care by specialist    AKI (acute kidney injury) ([place]) 06/27/2020   Acute diarrhea 06/27/2020   Altered mental status 08/28/2019   Septic joint of left knee joint ([place]) 04/22/2019   Erectile dysfunction 10/30/2013   Essential hypertension, benign 10/30/2013   Coronary artery disease involving native coronary artery of native heart without angina pectoris 10/30/2013   Dizziness and giddiness    Pain in joint, lower leg    Type 2 diabetes mellitus without complication, with long-term current use of insulin (HCC)    Obstructive sleep apnea    Depressive disorder, not elsewhere classified    Essential and other specified forms of tremor    Headache(784.0)    Spinal stenosis, lumbar region, without neurogenic claudication    Thoracic or lumbosacral neuritis or radiculitis, unspecified    Chronic pain syndrome     Past Surgical History:  Procedure Laterality Date   BACK SURGERY     back surgey     times 3   IR INJECT/THERA/INC NEEDLE/CATH/PLC EPI/LUMB/SAC W/IMG  11/26/2020       Family History  Problem Relation Age of Onset  Cancer Father    Heart disease Father    Stroke Mother    Diabetes Mother     Social History   Tobacco Use   Smoking status: Former    Pack years: 0.00   Smokeless tobacco: Never  Substance Use Topics   Alcohol use: No   Drug use: No    Home Medications Prior to Admission medications   Medication Sig Start Date End Date Taking? Authorizing Provider  diclofenac Sodium (VOLTAREN) 1 % GEL Apply 2 g topically 4 (four) times daily. 04/29/21  Yes Leshawn Houseworth S, PA-C  acetaminophen (TYLENOL) 500 MG tablet Take 2 tablets (1,000 mg total) by mouth every 6  (six) hours as needed for headache, mild pain or fever. 11/28/20   Dwyane Dee, MD  albuterol (VENTOLIN HFA) 108 (90 Base) MCG/ACT inhaler Inhale 2 puffs into the lungs every 4 (four) hours as needed for wheezing or shortness of breath. 11/03/20   Pokhrel, Corrie Mckusick, MD  amLODipine (NORVASC) 10 MG tablet Take 1 tablet (10 mg total) by mouth daily. 11/29/20   Dwyane Dee, MD  atorvastatin (LIPITOR) 80 MG tablet Take 80 mg by mouth daily. 12/14/20   [provider]  celecoxib (CELEBREX) 200 MG capsule Take 200 mg by mouth daily.    [provider]  cloNIDine (CATAPRES) 0.1 MG tablet Take 0.1 mg by mouth 2 (two) times daily.     [provider]  DULoxetine (CYMBALTA) 30 MG capsule Take 1 capsule (30 mg total) by mouth 2 (two) times daily. 02/24/21   Aline August, MD  ferrous sulfate 325 (65 FE) MG tablet Take 325 mg by mouth daily.     [provider]  folic acid (FOLVITE) 1 MG tablet Take 1 tablet (1 mg total) by mouth daily. 02/25/21   Aline August, MD  gabapentin (NEURONTIN) 300 MG capsule Take 1 capsule (300 mg total) by mouth 3 (three) times daily. Patient taking differently: Take 600 mg by mouth 3 (three) times daily. (Take with 100mg  to equal a total dose of 700mg .) 11/28/20   Dwyane Dee, MD  hydroxychloroquine (PLAQUENIL) 200 MG tablet Take 200 mg by mouth 2 (two) times daily.    [provider]  lidocaine (LIDODERM) 5 % Place 1 patch onto the skin daily. Remove & Discard patch within 12 hours or as directed by MD. Right chest Patient taking differently: Place 1 patch onto the skin daily. Remove & Discard patch within 12 hours or as directed by MD. Apply one patch to each knee. 11/03/20   Pokhrel, Corrie Mckusick, MD  linaclotide (LINZESS) 72 MCG capsule Take 72 mcg by mouth daily as needed (constipation).    [provider]  loperamide (IMODIUM A-D) 2 MG tablet Take 1 tablet (2 mg total) by mouth 4 (four) times daily as needed for diarrhea or loose  stools. 07/04/20   Georgette Shell, MD  LORazepam (ATIVAN) 1 MG tablet Take 0.5 tablets (0.5 mg total) by mouth every 8 (eight) hours as needed for anxiety. 02/24/21   Aline August, MD  Menthol, Topical Analgesic, (BIOFREEZE EX) Apply 1 application topically daily. To back of neck and lower back.    [provider]  methocarbamol (ROBAXIN) 500 MG tablet Take 1 tablet (500 mg total) by mouth 2 (two) times daily as needed for muscle spasms (hold for sedation). 02/24/21   Aline August, MD  Multiple Vitamin (MULTIVITAMIN WITH MINERALS) TABS tablet Take 1 tablet by mouth daily. 11/29/20   Dwyane Dee, MD  ondansetron (  ZOFRAN) 4 MG tablet Take 1 tablet (4 mg total) by mouth every 6 (six) hours as needed for nausea. 07/04/20   Georgette Shell, MD  pantoprazole (PROTONIX) 40 MG tablet Take 1 tablet (40 mg total) by mouth daily. 11/29/20   Dwyane Dee, MD  polyethylene glycol (MIRALAX / GLYCOLAX) 17 g packet Take 17 g by mouth daily as needed for mild constipation. 11/03/20   Pokhrel, Corrie Mckusick, MD  rosuvastatin (CRESTOR) 40 MG tablet Take 40 mg by mouth every evening.     [provider]  SUMAtriptan (IMITREX) 25 MG tablet Take 25 mg by mouth daily as needed for migraine. May repeat in 2 hours if headache persists or recurs.    [provider]  tamsulosin (FLOMAX) 0.4 MG CAPS capsule Take 0.4 mg by mouth daily.    [provider]  thiamine 100 MG tablet Take 1 tablet (100 mg total) by mouth daily. 02/25/21   Aline August, MD    Allergies    Cefepime, Iohexol, Fentanyl, and Iodine  Review of Systems   Review of Systems  Constitutional:  Negative for fatigue.  HENT:  Negative for dental problem.   Eyes:  Negative for visual disturbance.  Respiratory:  Positive for shortness of breath (from pain).   Cardiovascular:  Positive for chest pain (intermittent, none currently).  Gastrointestinal:  Negative for abdominal pain.  Genitourinary:  Negative for flank pain.   Musculoskeletal:        Bilat shoulder pain  Neurological:  Negative for weakness and numbness.   Physical Exam Updated Vital Signs BP 138/85   Pulse 83   Temp 98.4 F (36.9 C) (Oral)   Resp 18   Ht 5\' 6"  (1.676 m)   Wt 103 kg   SpO2 97%   BMI 36.65 kg/m   Physical Exam Vitals and nursing note reviewed.  Constitutional:      Appearance: He is well-developed.  HENT:     Head: Normocephalic and atraumatic.  Eyes:     Conjunctiva/sclera: Conjunctivae normal.  Cardiovascular:     Rate and Rhythm: Normal rate and regular rhythm.     Heart sounds: Normal heart sounds. No murmur heard. Pulmonary:     Effort: Pulmonary effort is normal. No respiratory distress.     Breath sounds: Normal breath sounds. No wheezing or rhonchi.  Abdominal:     General: Bowel sounds are normal.     Palpations: Abdomen is soft.     Tenderness: There is no abdominal tenderness.  Musculoskeletal:     Cervical back: Neck supple.     Comments: Ttp to the bilat shoulders. No erythema or warmth to the shoulders. Decreased rom secondary to pain but there is also poor effort. He is able to lift arms up to almost 90 degrees. Normal sensation and grip strength  Skin:    General: Skin is warm and dry.  Neurological:     Mental Status: He is alert.    ED Results / Procedures / Treatments   Labs (all labs ordered are listed, but only abnormal results are displayed) Labs Reviewed  BASIC METABOLIC PANEL - Abnormal; Notable for the following components:      Result Value   Glucose, Bld 115 (*)    All other components within normal limits  CBC WITH DIFFERENTIAL/PLATELET - Abnormal; Notable for the following components:   WBC 12.5 (*)    RBC 3.63 (*)    Hemoglobin 10.7 (*)    HCT 33.4 (*)  Neutro Abs 9.4 (*)    Monocytes Absolute 1.3 (*)    Abs Immature Granulocytes 0.13 (*)    All other components within normal limits  TROPONIN I (HIGH SENSITIVITY)    EKG EKG  Interpretation  Date/Time:  Thursday April 29 2021 13:27:38 EDT Ventricular Rate:  81 PR Interval:  226 QRS Duration: 90 QT Interval:  376 QTC Calculation: 436 R Axis:   -16 Text Interpretation: Sinus rhythm with 1st degree A-V block Otherwise normal ECG No significant change since last tracing Confirmed by Fredia Sorrow [phone]) on 04/29/2021 1:34:25 PM  Radiology DG Chest 2 View  Result Date: 04/29/2021 CLINICAL DATA:  Bilateral shoulder pain. EXAM: CHEST - 2 VIEW COMPARISON:  02/15/2021 FINDINGS: Both lateral views are technique degraded. Mild right hemidiaphragm elevation. Midline trachea. Normal heart size. Nonspecific diffuse interstitial thickening. No lobar consolidation. Bilateral advanced osteoarthritis of the glenohumeral joints. Right greater than left high riding humeral head, consistent with chronic rotator cuff insufficiency. Suspect intra-articular loose bodies projecting over the proximal left humerus, incompletely imaged. IMPRESSION: No acute cardiopulmonary disease. Degenerative changes about both glenohumeral joints with chronic rotator cuff insufficiency. Electronically Signed   By: Abigail Miyamoto M.D.   On: 04/29/2021 14:44    Procedures Procedures    Medications Ordered in ED Medications  oxyCODONE-acetaminophen (PERCOCET/ROXICET) 5-325 MG per tablet 1 tablet (1 tablet Oral Given 04/29/21 1332)  ketorolac (TORADOL) 30 MG/ML injection 30 mg (30 mg Intramuscular Given 04/29/21 1427)  diclofenac Sodium (VOLTAREN) 1 % topical gel 2 g (2 g Topical Given 04/29/21 1427)    ED Course  I have reviewed the triage vital signs and the nursing notes.  Pertinent labs & imaging results that were available during my care of the patient were reviewed by me and considered in my medical decision making (see chart for details).    MDM Rules/Calculators/A&P                          80 year old male presenting the emergency department today for evaluation of bilateral shoulder pain ongoing  chronically but worsened after he exercised 10 days ago and his pain got worse.  He reports intermittent chest pain but none currently is also appears to be chronic.  Denies any shortness of breath at present but states the pain is bad it takes his breath away.  Heart and lung exams are benign.  He does have some tenderness throughout the bilateral shoulders but he has no evidence of septic arthritis at this time.  His vital signs are reassuring.  CBC, BMP, troponin, EKG and chest x-ray are all reassuring.  I doubt symptoms are related to any emergent cardiac or pulmonary etiology at this time and feel that there is likely an exacerbation of his chronic shoulder pain.  He does report he has an orthopedic doctor advised him to follow-up as an outpatient.  We will also give Rx for Voltaren gel.  Have advised on strict return precautions.  He voiced understanding of plan and reasons to return. all Questions answered.  Patient stable for discharge.  Final Clinical Impression(s) / ED Diagnoses Final diagnoses:  Bilateral shoulder pain, unspecified chronicity    Rx / DC Orders ED Discharge Orders          Ordered    diclofenac Sodium (VOLTAREN) 1 % GEL  4 times daily        04/29/21 1506  Rodney Booze, PA-C 04/29/21 1509    Fredia Sorrow, MD 05/05/21 [phone]

## 2021-04-29 NOTE — Discharge Instructions (Addendum)
Use the voltaren gel as directed.   Please follow up with your primary care provider within 5-7 days for re-evaluation of your symptoms. If you do not have a primary care provider, information for a healthcare clinic has been provided for you to make arrangements for follow up care. Please return to the emergency department for any new or worsening symptoms.

## 2021-04-29 NOTE — ED Notes (Signed)
Attempted to call report to [facility] x2, no response. PTAR at bedside to take pt back.

## 2021-04-29 NOTE — ED Triage Notes (Signed)
Arrives via EMS from [place] on [address], complains of bilateral shoulder pain, was on a workout bike and it jerked his shoulders, stated he got x-rays and pt has two torn rotator cuffs per pt. Able to move shoulders. Pt requesting hospital transport.

## 2021-05-04 ENCOUNTER — Other Ambulatory Visit: Payer: Self-pay

## 2021-05-04 ENCOUNTER — Emergency Department (HOSPITAL_COMMUNITY)
Admission: EM | Admit: 2021-05-04 | Discharge: 2021-05-04 | Disposition: A | Discharge status: Home / Self Care | Payer: Medicare HMO | Attending: Emergency Medicine | Admitting: Emergency Medicine

## 2021-05-04 ENCOUNTER — Emergency Department (HOSPITAL_COMMUNITY): Payer: Medicare HMO

## 2021-05-04 ENCOUNTER — Encounter (HOSPITAL_COMMUNITY): Payer: Self-pay

## 2021-05-04 DIAGNOSIS — S8002XA Contusion of left knee, initial encounter: Secondary | ICD-10-CM | POA: Diagnosis not present

## 2021-05-04 DIAGNOSIS — M25512 Pain in left shoulder: Secondary | ICD-10-CM | POA: Insufficient documentation

## 2021-05-04 DIAGNOSIS — S8001XA Contusion of right knee, initial encounter: Secondary | ICD-10-CM | POA: Insufficient documentation

## 2021-05-04 DIAGNOSIS — E119 Type 2 diabetes mellitus without complications: Secondary | ICD-10-CM | POA: Insufficient documentation

## 2021-05-04 DIAGNOSIS — S299XXA Unspecified injury of thorax, initial encounter: Secondary | ICD-10-CM | POA: Diagnosis present

## 2021-05-04 DIAGNOSIS — W050XXA Fall from non-moving wheelchair, initial encounter: Secondary | ICD-10-CM | POA: Diagnosis not present

## 2021-05-04 DIAGNOSIS — Y92019 Unspecified place in single-family (private) house as the place of occurrence of the external cause: Secondary | ICD-10-CM | POA: Diagnosis not present

## 2021-05-04 DIAGNOSIS — G8929 Other chronic pain: Secondary | ICD-10-CM | POA: Insufficient documentation

## 2021-05-04 DIAGNOSIS — M25511 Pain in right shoulder: Secondary | ICD-10-CM | POA: Diagnosis not present

## 2021-05-04 DIAGNOSIS — I10 Essential (primary) hypertension: Secondary | ICD-10-CM | POA: Insufficient documentation

## 2021-05-04 DIAGNOSIS — T07XXXA Unspecified multiple injuries, initial encounter: Secondary | ICD-10-CM

## 2021-05-04 DIAGNOSIS — S2232XA Fracture of one rib, left side, initial encounter for closed fracture: Secondary | ICD-10-CM | POA: Insufficient documentation

## 2021-05-04 DIAGNOSIS — I2510 Atherosclerotic heart disease of native coronary artery without angina pectoris: Secondary | ICD-10-CM | POA: Diagnosis not present

## 2021-05-04 DIAGNOSIS — S2242XA Multiple fractures of ribs, left side, initial encounter for closed fracture: Secondary | ICD-10-CM

## 2021-05-04 DIAGNOSIS — Z79899 Other long term (current) drug therapy: Secondary | ICD-10-CM | POA: Diagnosis not present

## 2021-05-04 DIAGNOSIS — Z87891 Personal history of nicotine dependence: Secondary | ICD-10-CM | POA: Diagnosis not present

## 2021-05-04 MED ORDER — OXYCODONE-ACETAMINOPHEN 5-325 MG PO TABS: 1.0000 | ORAL_TABLET | Freq: Four times a day (QID) | ORAL | 0 refills | Status: DC | PRN

## 2021-05-04 MED ORDER — METHOCARBAMOL 500 MG PO TABS: 500.0000 mg | ORAL_TABLET | Freq: Two times a day (BID) | ORAL | 0 refills | Status: DC

## 2021-05-04 MED ORDER — OXYCODONE-ACETAMINOPHEN 5-325 MG PO TABS
2.0000 | ORAL_TABLET | Freq: Once | ORAL | Status: AC
  Administered 2021-05-04: 2 via ORAL
  Filled 2021-05-04: qty 2

## 2021-05-04 NOTE — ED Triage Notes (Signed)
Per EMS- Patient is from [place] on [address]  Patient was outside and fell out of his wheelchair.  Patient c/I bilateral shoulder pain, left knee pain and left rib pain. Patient states he did hit his head, but did not have LOC. Patient is not on blood thinners.

## 2021-05-04 NOTE — ED Notes (Signed)
Report given to Katharine Look at the [facility]. Patient on his way back to the facility

## 2021-05-04 NOTE — ED Provider Notes (Signed)
[facility] DEPT Provider Note   CSN: [id number] Arrival date & time: 05/04/21  1250     History Chief Complaint  Patient presents with   Kanyon Seibold is a 80 y.o. male.  80 year old male presents after fall at home.  Fell from a wheelchair and struck his head but did not have any loss of consciousness.  Complains of worsening chronic pain to bilateral shoulders.  Also had some pain in his bilateral knees.  Has left-sided rib pain but denies any dyspnea.  He is not had any abdominal discomfort.  Pain is characterizes sharp and worse with any movement or treatment use prior to arrival.  He denies using any blood thinners.      Past Medical History:  Diagnosis Date   Chronic pain    Depressive disorder, not elsewhere classified    Dizziness and giddiness    Essential and other specified forms of tremor    Headache(784.0)    Leukocytosis, unspecified    Obstructive sleep apnea (adult) (pediatric)    Pain in joint, lower leg    Septic joint of left knee joint ([place]) 04/22/2019   Spinal stenosis, lumbar region, without neurogenic claudication    Thoracic or lumbosacral neuritis or radiculitis, unspecified    Type II or unspecified type diabetes mellitus without mention of complication, not stated as uncontrolled    Unspecified cardiovascular disease    Unspecified essential hypertension     Patient Active Problem List   Diagnosis Date Noted   Acute respiratory failure with hypercapnia ([place]) 02/14/2021   Bilious emesis 02/14/2021   Acute hypercapnic respiratory failure ([place]) 02/14/2021   Hyponatremia 11/11/2020   Falls 10/30/2020   Dehydration with hyponatremia 10/29/2020   Fall at home, initial encounter 10/29/2020   Musculoskeletal chest pain 10/29/2020   Mixed hyperlipidemia due to type 2 diabetes mellitus ([place]) 10/29/2020   Benign prostatic hyperplasia without lower urinary tract symptoms 10/29/2020   Ileus ([place])    Declining  functional status    Goals of care, counseling/discussion    Palliative care by specialist    AKI (acute kidney injury) ([place]) 06/27/2020   Acute diarrhea 06/27/2020   Altered mental status 08/28/2019   Septic joint of left knee joint ([place]) 04/22/2019   Erectile dysfunction 10/30/2013   Essential hypertension, benign 10/30/2013   Coronary artery disease involving native coronary artery of native heart without angina pectoris 10/30/2013   Dizziness and giddiness    Pain in joint, lower leg    Type 2 diabetes mellitus without complication, with long-term current use of insulin (HCC)    Obstructive sleep apnea    Depressive disorder, not elsewhere classified    Essential and other specified forms of tremor    Headache(784.0)    Spinal stenosis, lumbar region, without neurogenic claudication    Thoracic or lumbosacral neuritis or radiculitis, unspecified    Chronic pain syndrome     Past Surgical History:  Procedure Laterality Date   BACK SURGERY     back surgey     times 3   IR INJECT/THERA/INC NEEDLE/CATH/PLC EPI/LUMB/SAC W/IMG  11/26/2020       Family History  Problem Relation Age of Onset   Cancer Father    Heart disease Father    Stroke Mother    Diabetes Mother     Social History   Tobacco Use   Smoking status: Former    Pack years: 0.00   Smokeless tobacco: Never  Media planner  Vaping Use: Never used  Substance Use Topics   Alcohol use: No   Drug use: No    Home Medications Prior to Admission medications   Medication Sig Start Date End Date Taking? Authorizing Provider  acetaminophen (TYLENOL) 500 MG tablet Take 2 tablets (1,000 mg total) by mouth every 6 (six) hours as needed for headache, mild pain or fever. 11/28/20   Dwyane Dee, MD  albuterol (VENTOLIN HFA) 108 (90 Base) MCG/ACT inhaler Inhale 2 puffs into the lungs every 4 (four) hours as needed for wheezing or shortness of breath. 11/03/20   Pokhrel, Corrie Mckusick, MD  amLODipine (NORVASC) 10 MG tablet Take 1  tablet (10 mg total) by mouth daily. 11/29/20   Dwyane Dee, MD  atorvastatin (LIPITOR) 80 MG tablet Take 80 mg by mouth daily. 12/14/20   [provider]  celecoxib (CELEBREX) 200 MG capsule Take 200 mg by mouth daily.    [provider]  cloNIDine (CATAPRES) 0.1 MG tablet Take 0.1 mg by mouth 2 (two) times daily.     [provider]  diclofenac Sodium (VOLTAREN) 1 % GEL Apply 2 g topically 4 (four) times daily. 04/29/21   Couture, Cortni S, PA-C  doxycycline (VIBRAMYCIN) 100 MG capsule Take 1 capsule (100 mg total) by mouth 2 (two) times daily for 7 days. 04/29/21 05/06/21  Couture, Cortni S, PA-C  DULoxetine (CYMBALTA) 30 MG capsule Take 1 capsule (30 mg total) by mouth 2 (two) times daily. 02/24/21   Aline August, MD  ferrous sulfate 325 (65 FE) MG tablet Take 325 mg by mouth daily.     [provider]  folic acid (FOLVITE) 1 MG tablet Take 1 tablet (1 mg total) by mouth daily. 02/25/21   Aline August, MD  gabapentin (NEURONTIN) 300 MG capsule Take 1 capsule (300 mg total) by mouth 3 (three) times daily. Patient taking differently: Take 600 mg by mouth 3 (three) times daily. (Take with 100mg  to equal a total dose of 700mg .) 11/28/20   Dwyane Dee, MD  hydroxychloroquine (PLAQUENIL) 200 MG tablet Take 200 mg by mouth 2 (two) times daily.    [provider]  lidocaine (LIDODERM) 5 % Place 1 patch onto the skin daily. Remove & Discard patch within 12 hours or as directed by MD. Right chest Patient taking differently: Place 1 patch onto the skin daily. Remove & Discard patch within 12 hours or as directed by MD. Apply one patch to each knee. 11/03/20   Pokhrel, Corrie Mckusick, MD  linaclotide (LINZESS) 72 MCG capsule Take 72 mcg by mouth daily as needed (constipation).    [provider]  loperamide (IMODIUM A-D) 2 MG tablet Take 1 tablet (2 mg total) by mouth 4 (four) times daily as needed for diarrhea or loose stools. 07/04/20   Georgette Shell, MD   LORazepam (ATIVAN) 1 MG tablet Take 0.5 tablets (0.5 mg total) by mouth every 8 (eight) hours as needed for anxiety. 02/24/21   Aline August, MD  Menthol, Topical Analgesic, (BIOFREEZE EX) Apply 1 application topically daily. To back of neck and lower back.    [provider]  methocarbamol (ROBAXIN) 500 MG tablet Take 1 tablet (500 mg total) by mouth 2 (two) times daily as needed for muscle spasms (hold for sedation). 02/24/21   Aline August, MD  Multiple Vitamin (MULTIVITAMIN WITH MINERALS) TABS tablet Take 1 tablet by mouth daily. 11/29/20   Dwyane Dee, MD  ondansetron (ZOFRAN) 4 MG tablet Take 1 tablet (4 mg total) by mouth  every 6 (six) hours as needed for nausea. 07/04/20   Georgette Shell, MD  pantoprazole (PROTONIX) 40 MG tablet Take 1 tablet (40 mg total) by mouth daily. 11/29/20   Dwyane Dee, MD  polyethylene glycol (MIRALAX / GLYCOLAX) 17 g packet Take 17 g by mouth daily as needed for mild constipation. 11/03/20   Pokhrel, Corrie Mckusick, MD  rosuvastatin (CRESTOR) 40 MG tablet Take 40 mg by mouth every evening.     [provider]  SUMAtriptan (IMITREX) 25 MG tablet Take 25 mg by mouth daily as needed for migraine. May repeat in 2 hours if headache persists or recurs.    [provider]  tamsulosin (FLOMAX) 0.4 MG CAPS capsule Take 0.4 mg by mouth daily.    [provider]  thiamine 100 MG tablet Take 1 tablet (100 mg total) by mouth daily. 02/25/21   Aline August, MD    Allergies    Cefepime, Iohexol, Fentanyl, and Iodine  Review of Systems   Review of Systems  All other systems reviewed and are negative.  Physical Exam Updated Vital Signs BP 136/73 (BP Location: Right Arm)   Pulse 90   Temp 97.6 F (36.4 C) (Oral)   Resp 18   Ht 1.676 m (5\' 6" )   Wt 103 kg   SpO2 99%   BMI 36.65 kg/m   Physical Exam Vitals and nursing note reviewed.  Constitutional:      General: He is not in acute distress.    Appearance: Normal appearance. He  is well-developed. He is not toxic-appearing.  HENT:     Head: Normocephalic and atraumatic.  Eyes:     General: Lids are normal.     Conjunctiva/sclera: Conjunctivae normal.     Pupils: Pupils are equal, round, and reactive to light.  Neck:     Thyroid: No thyroid mass.     Trachea: No tracheal deviation.  Cardiovascular:     Rate and Rhythm: Normal rate and regular rhythm.     Heart sounds: Normal heart sounds. No murmur heard.   No gallop.  Pulmonary:     Effort: Pulmonary effort is normal. No respiratory distress.     Breath sounds: Normal breath sounds. No stridor. No decreased breath sounds, wheezing, rhonchi or rales.  Chest:     Comments: Pain to palpation at left upper costal margin without crepitus or bruising. Abdominal:     General: There is no distension.     Palpations: Abdomen is soft.     Tenderness: There is no abdominal tenderness. There is no rebound.     Comments: No abdominal pain to palpation  Musculoskeletal:        General: No tenderness. Normal range of motion.     Cervical back: Normal range of motion and neck supple.     Comments: Bilateral knee contusions noted.  Full range of motion at both knees.  Pelvis is stable.  No hip pain noted with range of motion.  Bilateral shoulder motion limited by pain but otherwise neurovascular tact at both hands.  Skin:    General: Skin is warm and dry.     Findings: No abrasion or rash.  Neurological:     General: No focal deficit present.     Mental Status: He is alert and oriented to person, place, and time. Mental status is at baseline.     GCS: GCS eye subscore is 4. GCS verbal subscore is 5. GCS motor subscore is 6.     Cranial  Nerves: Cranial nerves are intact. No cranial nerve deficit.     Sensory: No sensory deficit.     Motor: Motor function is intact.  Psychiatric:        Attention and Perception: Attention normal.        Speech: Speech normal.        Behavior: Behavior normal.    ED Results /  Procedures / Treatments   Labs (all labs ordered are listed, but only abnormal results are displayed) Labs Reviewed - No data to display  EKG None  Radiology DG Ribs Unilateral W/Chest Left  Result Date: 05/04/2021 CLINICAL DATA:  Fall with left-sided rib pain EXAM: LEFT RIBS AND CHEST - 3+ VIEW COMPARISON:  Chest x-ray 04/29/2021 FINDINGS: Single-view chest demonstrates no focal opacity or pleural effusion. Scarring or atelectasis left base. Cardiomediastinal silhouette within normal limits. No pneumothorax. Left rib series demonstrates possible age indeterminate deformities of left third, fourth, and fifth anterior ribs. IMPRESSION: 1. Negative for pneumothorax or pleural effusion 2. Possible age indeterminate left third through fifth anterior rib fractures. Electronically Signed   By: Donavan Foil M.D.   On: 05/04/2021 15:19   DG Lumbar Spine Complete  Result Date: 05/04/2021 CLINICAL DATA:  Fall with back pain EXAM: LUMBAR SPINE - COMPLETE 4+ VIEW COMPARISON:  08/18/2018, CT 02/14/2021, CT 08/31/2019 FINDINGS: Stable lumbar alignment. Posterior fusion hardware L2 through L5 with additional hardware at S1, grossly stable in alignment. Interbody devices at L2-L3 and L3-L4. Chronic compression deformity at L1 post augmentation. Degenerative changes of the lower thoracic spine. Aortic atherosclerosis. IMPRESSION: Extensive fusion hardware L2 through the upper sacrum with old compression deformity at L1. No definite acute osseous abnormality. Electronically Signed   By: Donavan Foil M.D.   On: 05/04/2021 15:24   DG Shoulder Right  Result Date: 05/04/2021 CLINICAL DATA:  Fall with shoulder pain EXAM: RIGHT SHOULDER - 2+ VIEW COMPARISON:  03/28/2021, 10/29/2020 FINDINGS: Chronically widened right AC joint with associated degenerative changes. Severe arthritis of the glenohumeral joint with bone on bone appearance, sclerosis, and spurring. High-riding humeral head as before. IMPRESSION: 1. No acute  osseous abnormality. 2. Chronic advanced degenerative changes of the right shoulder. High-riding humeral head consistent with rotator cuff disease Electronically Signed   By: Donavan Foil M.D.   On: 05/04/2021 15:29   DG Shoulder Left  Result Date: 05/04/2021 CLINICAL DATA:  Fall with pain EXAM: LEFT SHOULDER - 2+ VIEW COMPARISON:  03/28/2021 FINDINGS: No acute displaced fracture or malalignment. Advanced arthritis at the [facility] joint and glenohumeral interval. Multiple calcified loose bodies adjacent to the proximal humerus. IMPRESSION: No acute osseous abnormality. Advanced arthritis of the [facility] joint and glenohumeral joint Electronically Signed   By: Donavan Foil M.D.   On: 05/04/2021 15:26   DG Knee Complete 4 Views Left  Result Date: 05/04/2021 CLINICAL DATA:  Pain following fall EXAM: LEFT KNEE - COMPLETE 4+ VIEW COMPARISON:  None. FINDINGS: Frontal, lateral, and bilateral oblique views were obtained. There is no appreciable fracture or dislocation. There is a small joint effusion. There is moderately severe narrowing medially and in the patellofemoral joint region. There is spurring in all compartments. No erosions. There are multiple foci of arterial vascular calcification. IMPRESSION: No fracture or dislocation.  Small joint effusion. Extensive osteoarthritic change, most severe medially and in the patellofemoral joint regions. Electronically Signed   By: Lowella Grip III M.D.   On: 05/04/2021 15:15   DG Knee Complete 4 Views Right  Result Date: 05/04/2021 CLINICAL  DATA:  80 year old male with fall and right knee pain. EXAM: RIGHT KNEE - COMPLETE 4+ VIEW COMPARISON:  None. FINDINGS: There is no acute fracture or dislocation. The bones are osteopenic. There is severe osteoarthritic changes with tricompartmental narrowing and spurring most pronounced involving the medial compartment. There is a small suprapatellar effusion. The soft tissues are unremarkable. Vascular calcification. IMPRESSION: 1.  No acute fracture or dislocation. 2. Severe osteoarthritis. Electronically Signed   By: Anner Crete M.D.   On: 05/04/2021 15:16    Procedures Procedures   Medications Ordered in ED Medications  oxyCODONE-acetaminophen (PERCOCET/ROXICET) 5-325 MG per tablet 2 tablet (has no administration in time range)    ED Course  I have reviewed the triage vital signs and the nursing notes.  Pertinent labs & imaging results that were available during my care of the patient were reviewed by me and considered in my medical decision making (see chart for details).    MDM Rules/Calculators/A&P                          Patient's x-rays were notable for nondisplaced rib fractures on the left side.  Patient medicated for pain here with 2 Percocet.  Will prescribe same as well as give patient incentive spirometer.  Return precautions given Final Clinical Impression(s) / ED Diagnoses Final diagnoses:  None    Rx / DC Orders ED Discharge Orders     None        Lacretia Leigh, MD 05/04/21 1715

## 2021-05-04 NOTE — ED Provider Notes (Signed)
Emergency Medicine Provider Triage Evaluation Note  Jorge Sherman , a 80 y.o. male  was evaluated in triage.  Pt complains of injuries from a fall.  Patient was riding in his motorized wheelchair when he tried to go into the grass to avoid a bump in the sidewalk and accidentally tipped the wheelchair to the side.  Patient reports pain in his left lower ribs, both of his knees and both shoulders and low back.  Is not anticoagulated, states that he did hit his head but did not lose consciousness..  Review of Systems  Positive: Knee pain, shoulder pain, rib pain Negative: Bruises easily  Physical Exam  BP (!) 144/99   Pulse 87   Temp 99 F (37.2 C) (Oral)   Resp 18   Ht 5\' 6"  (1.676 m)   Wt 103 kg   SpO2 94%   BMI 36.65 kg/m  Gen:   Awake, no distress   Resp:  Normal effort  MSK:   Moves extremities without difficulty  Other:  Minor abrasion to knee, able to straight leg raise both knees.  No deformity noted to either shoulder however reports rotator cuff surgery in the past and concern for new injury.  No obvious head wounds  Medical Decision Making  Medically screening exam initiated at 1:56 PM.  Appropriate orders placed.  Jorge Sherman was informed that the remainder of the evaluation will be completed by another provider, this initial triage assessment does not replace that evaluation, and the importance of remaining in the ED until their evaluation is complete.     Tacy Learn, PA-C 05/04/21 1357    Varney Biles, MD 05/05/21 [phone]

## 2021-05-10 ENCOUNTER — Emergency Department (HOSPITAL_BASED_OUTPATIENT_CLINIC_OR_DEPARTMENT_OTHER)
Admission: EM | Admit: 2021-05-10 | Discharge: 2021-05-10 | Disposition: A | Discharge status: Home / Self Care | Payer: Medicare HMO | Attending: Emergency Medicine | Admitting: Emergency Medicine

## 2021-05-10 ENCOUNTER — Encounter (HOSPITAL_BASED_OUTPATIENT_CLINIC_OR_DEPARTMENT_OTHER): Payer: Self-pay | Admitting: *Deleted

## 2021-05-10 DIAGNOSIS — M25561 Pain in right knee: Secondary | ICD-10-CM | POA: Insufficient documentation

## 2021-05-10 DIAGNOSIS — M25511 Pain in right shoulder: Secondary | ICD-10-CM

## 2021-05-10 DIAGNOSIS — G8929 Other chronic pain: Secondary | ICD-10-CM | POA: Diagnosis not present

## 2021-05-10 DIAGNOSIS — I10 Essential (primary) hypertension: Secondary | ICD-10-CM | POA: Insufficient documentation

## 2021-05-10 DIAGNOSIS — M25512 Pain in left shoulder: Secondary | ICD-10-CM | POA: Insufficient documentation

## 2021-05-10 DIAGNOSIS — E119 Type 2 diabetes mellitus without complications: Secondary | ICD-10-CM | POA: Diagnosis not present

## 2021-05-10 DIAGNOSIS — Z87891 Personal history of nicotine dependence: Secondary | ICD-10-CM | POA: Diagnosis not present

## 2021-05-10 DIAGNOSIS — M545 Low back pain, unspecified: Secondary | ICD-10-CM | POA: Diagnosis present

## 2021-05-10 DIAGNOSIS — M25562 Pain in left knee: Secondary | ICD-10-CM | POA: Diagnosis not present

## 2021-05-10 DIAGNOSIS — Z79899 Other long term (current) drug therapy: Secondary | ICD-10-CM | POA: Insufficient documentation

## 2021-05-10 MED ORDER — OXYCODONE-ACETAMINOPHEN 5-325 MG PO TABS
2.0000 | ORAL_TABLET | Freq: Once | ORAL | Status: AC
Start: 2021-05-10 — End: 2021-05-10
  Administered 2021-05-10: 2 via ORAL
  Filled 2021-05-10: qty 2

## 2021-05-10 MED ORDER — OXYCODONE-ACETAMINOPHEN 5-325 MG PO TABS: 1.0000 | ORAL_TABLET | Freq: Four times a day (QID) | ORAL | 0 refills | Status: DC | PRN

## 2021-05-10 MED ORDER — KETOROLAC TROMETHAMINE 30 MG/ML IJ SOLN
30.0000 mg | Freq: Once | INTRAMUSCULAR | Status: AC
  Administered 2021-05-10: 30 mg via INTRAMUSCULAR
  Filled 2021-05-10: qty 1

## 2021-05-10 NOTE — ED Notes (Signed)
Safety measures in place due to high fall risk

## 2021-05-10 NOTE — ED Notes (Signed)
States he resides at [place], states they have not given him any pain medication for the past week. Has been going to pain clinic.

## 2021-05-10 NOTE — ED Notes (Signed)
In to medicate pt as per orders, pt snoring and sleeping quietly, pt easily responds to light verbal stimuli

## 2021-05-10 NOTE — ED Provider Notes (Signed)
[facility] EMERGENCY DEPARTMENT Provider Note   CSN: [id number] Arrival date & time: 05/10/21  1018     History Chief Complaint  Patient presents with   Jorge Sherman is a 80 y.o. male.  He has chronic shoulder back and knee pain from various old injuries.  He said he fell out of his scooter a week ago which is exacerbated his pain.  He went to [facility].  Multiple x-rays with no acute fractures other than a possible rib fracture.  He said he was prescribed pain medicine but somehow misplaced the prescription.  He went to a pain clinic since then and they are going to prescribe pain medicine but it seems because he has not received this medication yet.  He said he has been out of pain medicine for a week and cannot stand it anymore.  No new injuries or complaints.  The history is provided by the patient.  Fall This is a recurrent problem. The current episode started more than 1 week ago. The problem has not changed since onset.Associated symptoms include chest pain. Pertinent negatives include no abdominal pain, no headaches and no shortness of breath. The symptoms are aggravated by bending and twisting. Nothing relieves the symptoms. He has tried rest for the symptoms. The treatment provided no relief.      Past Medical History:  Diagnosis Date   Chronic pain    Depressive disorder, not elsewhere classified    Dizziness and giddiness    Essential and other specified forms of tremor    Headache(784.0)    Leukocytosis, unspecified    Obstructive sleep apnea (adult) (pediatric)    Pain in joint, lower leg    Septic joint of left knee joint ([place]) 04/22/2019   Spinal stenosis, lumbar region, without neurogenic claudication    Thoracic or lumbosacral neuritis or radiculitis, unspecified    Type II or unspecified type diabetes mellitus without mention of complication, not stated as uncontrolled    Unspecified cardiovascular disease    Unspecified essential  hypertension     Patient Active Problem List   Diagnosis Date Noted   Acute respiratory failure with hypercapnia ([place]) 02/14/2021   Bilious emesis 02/14/2021   Acute hypercapnic respiratory failure ([place]) 02/14/2021   Hyponatremia 11/11/2020   Falls 10/30/2020   Dehydration with hyponatremia 10/29/2020   Fall at home, initial encounter 10/29/2020   Musculoskeletal chest pain 10/29/2020   Mixed hyperlipidemia due to type 2 diabetes mellitus ([place]) 10/29/2020   Benign prostatic hyperplasia without lower urinary tract symptoms 10/29/2020   Ileus ([place])    Declining functional status    Goals of care, counseling/discussion    Palliative care by specialist    AKI (acute kidney injury) ([place]) 06/27/2020   Acute diarrhea 06/27/2020   Altered mental status 08/28/2019   Septic joint of left knee joint ([place]) 04/22/2019   Erectile dysfunction 10/30/2013   Essential hypertension, benign 10/30/2013   Coronary artery disease involving native coronary artery of native heart without angina pectoris 10/30/2013   Dizziness and giddiness    Pain in joint, lower leg    Type 2 diabetes mellitus without complication, with long-term current use of insulin (HCC)    Obstructive sleep apnea    Depressive disorder, not elsewhere classified    Essential and other specified forms of tremor    Headache(784.0)    Spinal stenosis, lumbar region, without neurogenic claudication    Thoracic or lumbosacral neuritis or radiculitis, unspecified  Chronic pain syndrome     Past Surgical History:  Procedure Laterality Date   BACK SURGERY     back surgey     times 3   IR INJECT/THERA/INC NEEDLE/CATH/PLC EPI/LUMB/SAC W/IMG  11/26/2020       Family History  Problem Relation Age of Onset   Cancer Father    Heart disease Father    Stroke Mother    Diabetes Mother     Social History   Tobacco Use   Smoking status: Former    Pack years: 0.00   Smokeless tobacco: Never  Vaping Use   Vaping Use: Never used   Substance Use Topics   Alcohol use: No   Drug use: No    Home Medications Prior to Admission medications   Medication Sig Start Date End Date Taking? Authorizing Provider  acetaminophen (TYLENOL) 500 MG tablet Take 2 tablets (1,000 mg total) by mouth every 6 (six) hours as needed for headache, mild pain or fever. 11/28/20   Dwyane Dee, MD  albuterol (VENTOLIN HFA) 108 (90 Base) MCG/ACT inhaler Inhale 2 puffs into the lungs every 4 (four) hours as needed for wheezing or shortness of breath. 11/03/20   Pokhrel, Corrie Mckusick, MD  amLODipine (NORVASC) 10 MG tablet Take 1 tablet (10 mg total) by mouth daily. 11/29/20   Dwyane Dee, MD  atorvastatin (LIPITOR) 80 MG tablet Take 80 mg by mouth daily. 12/14/20   [provider]  celecoxib (CELEBREX) 200 MG capsule Take 200 mg by mouth daily.    [provider]  cloNIDine (CATAPRES) 0.1 MG tablet Take 0.1 mg by mouth 2 (two) times daily.     [provider]  diclofenac Sodium (VOLTAREN) 1 % GEL Apply 2 g topically 4 (four) times daily. 04/29/21   Couture, Cortni S, PA-C  DULoxetine (CYMBALTA) 30 MG capsule Take 1 capsule (30 mg total) by mouth 2 (two) times daily. 02/24/21   Aline August, MD  ferrous sulfate 325 (65 FE) MG tablet Take 325 mg by mouth daily.     [provider]  folic acid (FOLVITE) 1 MG tablet Take 1 tablet (1 mg total) by mouth daily. 02/25/21   Aline August, MD  gabapentin (NEURONTIN) 300 MG capsule Take 1 capsule (300 mg total) by mouth 3 (three) times daily. Patient taking differently: Take 600 mg by mouth 3 (three) times daily. (Take with 100mg  to equal a total dose of 700mg .) 11/28/20   Dwyane Dee, MD  hydroxychloroquine (PLAQUENIL) 200 MG tablet Take 200 mg by mouth 2 (two) times daily.    [provider]  lidocaine (LIDODERM) 5 % Place 1 patch onto the skin daily. Remove & Discard patch within 12 hours or as directed by MD. Right chest Patient taking differently: Place 1 patch onto the  skin daily. Remove & Discard patch within 12 hours or as directed by MD. Apply one patch to each knee. 11/03/20   Pokhrel, Corrie Mckusick, MD  linaclotide (LINZESS) 72 MCG capsule Take 72 mcg by mouth daily as needed (constipation).    [provider]  loperamide (IMODIUM A-D) 2 MG tablet Take 1 tablet (2 mg total) by mouth 4 (four) times daily as needed for diarrhea or loose stools. 07/04/20   Georgette Shell, MD  LORazepam (ATIVAN) 1 MG tablet Take 0.5 tablets (0.5 mg total) by mouth every 8 (eight) hours as needed for anxiety. 02/24/21   Aline August, MD  Menthol, Topical Analgesic, (BIOFREEZE EX) Apply 1 application topically daily. To back of  neck and lower back.    [provider]  methocarbamol (ROBAXIN) 500 MG tablet Take 1 tablet (500 mg total) by mouth 2 (two) times daily as needed for muscle spasms (hold for sedation). 02/24/21   Aline August, MD  methocarbamol (ROBAXIN) 500 MG tablet Take 1 tablet (500 mg total) by mouth 2 (two) times daily. 05/04/21   Lacretia Leigh, MD  Multiple Vitamin (MULTIVITAMIN WITH MINERALS) TABS tablet Take 1 tablet by mouth daily. 11/29/20   Dwyane Dee, MD  ondansetron (ZOFRAN) 4 MG tablet Take 1 tablet (4 mg total) by mouth every 6 (six) hours as needed for nausea. 07/04/20   Georgette Shell, MD  oxyCODONE-acetaminophen (PERCOCET/ROXICET) 5-325 MG tablet Take 1 tablet by mouth every 6 (six) hours as needed for severe pain. 05/04/21   Lacretia Leigh, MD  pantoprazole (PROTONIX) 40 MG tablet Take 1 tablet (40 mg total) by mouth daily. 11/29/20   Dwyane Dee, MD  polyethylene glycol (MIRALAX / GLYCOLAX) 17 g packet Take 17 g by mouth daily as needed for mild constipation. 11/03/20   Pokhrel, Corrie Mckusick, MD  rosuvastatin (CRESTOR) 40 MG tablet Take 40 mg by mouth every evening.     [provider]  SUMAtriptan (IMITREX) 25 MG tablet Take 25 mg by mouth daily as needed for migraine. May repeat in 2 hours if headache persists or recurs.     [provider]  tamsulosin (FLOMAX) 0.4 MG CAPS capsule Take 0.4 mg by mouth daily.    [provider]  thiamine 100 MG tablet Take 1 tablet (100 mg total) by mouth daily. 02/25/21   Aline August, MD    Allergies    Cefepime, Iohexol, Fentanyl, and Iodine  Review of Systems   Review of Systems  Constitutional:  Negative for fever.  HENT:  Negative for sore throat.   Eyes:  Negative for visual disturbance.  Respiratory:  Negative for shortness of breath.   Cardiovascular:  Positive for chest pain.  Gastrointestinal:  Negative for abdominal pain.  Genitourinary:  Negative for dysuria.  Musculoskeletal:  Positive for arthralgias, back pain and gait problem.  Skin:  Negative for rash.  Neurological:  Negative for headaches.   Physical Exam Updated Vital Signs BP 111/65 (BP Location: Right Arm)   Pulse 83   Temp 98.2 F (36.8 C) (Oral)   Resp 18   Ht 5\' 10"  (1.778 m)   Wt 99.8 kg   SpO2 99%   BMI 31.57 kg/m   Physical Exam Vitals and nursing note reviewed.  Constitutional:      Appearance: Normal appearance. He is well-developed.  HENT:     Head: Normocephalic and atraumatic.  Eyes:     Conjunctiva/sclera: Conjunctivae normal.  Cardiovascular:     Rate and Rhythm: Normal rate and regular rhythm.     Heart sounds: No murmur heard. Pulmonary:     Effort: Pulmonary effort is normal. No respiratory distress.     Breath sounds: Normal breath sounds.  Abdominal:     Palpations: Abdomen is soft.     Tenderness: There is no abdominal tenderness. There is no guarding or rebound.  Musculoskeletal:        General: Tenderness present.     Cervical back: Neck supple.     Comments: He has tenderness with range of motion of his shoulders.  Also tender with range of motion at his knees bilaterally.  Generalized low back pain.  Some left lateral rib pain.  Skin:    General: Skin  is warm and dry.  Neurological:     General: No focal deficit present.     Mental  Status: He is alert.    ED Results / Procedures / Treatments   Labs (all labs ordered are listed, but only abnormal results are displayed) Labs Reviewed - No data to display  EKG None  Radiology No results found.  Procedures Procedures   Medications Ordered in ED Medications  oxyCODONE-acetaminophen (PERCOCET/ROXICET) 5-325 MG per tablet 2 tablet (has no administration in time range)  ketorolac (TORADOL) 30 MG/ML injection 30 mg (has no administration in time range)    ED Course  I have reviewed the triage vital signs and the nursing notes.  Pertinent labs & imaging results that were available during my care of the patient were reviewed by me and considered in my medical decision making (see chart for details).    MDM Rules/Calculators/A&P                         80 year old male with chronic shoulder back and knee pain here with worsening exacerbation of his pain after being out of his pain medication.  It does not look like he has had a prescription filled for least a few weeks.  No new trauma.  He had a lot of imaging just 6 days ago at [facility].  He otherwise appears to be at baseline.  Have given pain medicine and some Toradol at his request.  He will be returned to his facility by ambulance.  I provided a paper prescription for oxycodone for a few days until he can make better arrangements.  Final Clinical Impression(s) / ED Diagnoses Final diagnoses:  Chronic midline low back pain, unspecified whether sciatica present  Chronic pain of both shoulders  Chronic pain of both knees    Rx / DC Orders ED Discharge Orders          Ordered    oxyCODONE-acetaminophen (PERCOCET/ROXICET) 5-325 MG tablet  Every 6 hours PRN        05/10/21 [place]             Hayden Rasmussen, MD 05/10/21 [phone]

## 2021-05-10 NOTE — ED Notes (Signed)
READY FOR DC TO ASSISTED LIVING FACILITY, PTAR NOTIFIED, AWAITING FOR PTAR ARRIVAL FOR RETURN TRANSPORT

## 2021-05-10 NOTE — ED Triage Notes (Signed)
Arrived via [facility], fell approx 1 week ago, presents with back pain. Utilizes a pain clinic

## 2021-05-10 NOTE — ED Notes (Signed)
PTAR at bedside, provided handoff report to [facility] team, also copy of AVS/ Med Nec Form and one Rx

## 2021-06-03 ENCOUNTER — Encounter (HOSPITAL_COMMUNITY): Payer: Self-pay | Admitting: *Deleted

## 2021-06-03 ENCOUNTER — Emergency Department (HOSPITAL_COMMUNITY)
Admission: EM | Admit: 2021-06-03 | Discharge: 2021-06-03 | Disposition: A | Discharge status: Home / Self Care | Payer: Medicare HMO | Attending: Emergency Medicine | Admitting: Emergency Medicine

## 2021-06-03 DIAGNOSIS — E119 Type 2 diabetes mellitus without complications: Secondary | ICD-10-CM | POA: Insufficient documentation

## 2021-06-03 DIAGNOSIS — Z79899 Other long term (current) drug therapy: Secondary | ICD-10-CM | POA: Insufficient documentation

## 2021-06-03 DIAGNOSIS — Z87891 Personal history of nicotine dependence: Secondary | ICD-10-CM | POA: Insufficient documentation

## 2021-06-03 DIAGNOSIS — I2510 Atherosclerotic heart disease of native coronary artery without angina pectoris: Secondary | ICD-10-CM | POA: Diagnosis not present

## 2021-06-03 DIAGNOSIS — I10 Essential (primary) hypertension: Secondary | ICD-10-CM | POA: Diagnosis not present

## 2021-06-03 DIAGNOSIS — R52 Pain, unspecified: Secondary | ICD-10-CM

## 2021-06-03 MED ORDER — HYDROMORPHONE HCL 1 MG/ML IJ SOLN
1.0000 mg | Freq: Once | INTRAMUSCULAR | Status: AC
  Administered 2021-06-03: 1 mg via INTRAMUSCULAR
  Filled 2021-06-03: qty 1

## 2021-06-03 MED ORDER — KETOROLAC TROMETHAMINE 30 MG/ML IJ SOLN
15.0000 mg | Freq: Once | INTRAMUSCULAR | Status: AC
  Administered 2021-06-03: 15 mg via INTRAMUSCULAR
  Filled 2021-06-03: qty 1

## 2021-06-03 MED ORDER — GABAPENTIN 300 MG PO CAPS
300.0000 mg | ORAL_CAPSULE | Freq: Once | ORAL | Status: AC
  Administered 2021-06-03: 300 mg via ORAL
  Filled 2021-06-03: qty 1

## 2021-06-03 MED ORDER — ACETAMINOPHEN 500 MG PO TABS
1000.0000 mg | ORAL_TABLET | Freq: Once | ORAL | Status: AC
  Administered 2021-06-03: 1000 mg via ORAL
  Filled 2021-06-03: qty 2

## 2021-06-03 MED ORDER — DEXAMETHASONE SODIUM PHOSPHATE 10 MG/ML IJ SOLN
10.0000 mg | Freq: Once | INTRAMUSCULAR | Status: AC
  Administered 2021-06-03: 10 mg via INTRAMUSCULAR
  Filled 2021-06-03: qty 1

## 2021-06-03 MED ORDER — PREDNISONE 20 MG PO TABS: 40.0000 mg | ORAL_TABLET | Freq: Every day | ORAL | 0 refills | Status: DC

## 2021-06-03 MED ORDER — KETOROLAC TROMETHAMINE 30 MG/ML IJ SOLN: 15.0000 mg | Freq: Once | INTRAMUSCULAR | Status: DC

## 2021-06-03 MED ORDER — PREDNISONE 20 MG PO TABS
60.0000 mg | ORAL_TABLET | ORAL | Status: AC
  Administered 2021-06-03: 60 mg via ORAL
  Filled 2021-06-03: qty 3

## 2021-06-03 NOTE — ED Provider Notes (Signed)
[facility] DEPT Provider Note   CSN: [id number] Arrival date & time: 06/03/21  0756     History No chief complaint on file.   Jorge Sherman is a 80 y.o. male.  HPI Patient with multiple medical issues, notably chronic pain presents with worsening pain. No new falls, no new trauma. He was seen and evaluated after his most recent series of falls, at our affiliated facility.  He notes in spite of taking his home medication regimen including multiple narcotics continue to have diffuse pain, worse in his shoulders where he has known bilateral rotator cuff injuries. He has previously been in a rehabilitation facility, but currently lives in assisted living.    Past Medical History:  Diagnosis Date   Chronic pain    Depressive disorder, not elsewhere classified    Dizziness and giddiness    Essential and other specified forms of tremor    Headache(784.0)    Leukocytosis, unspecified    Obstructive sleep apnea (adult) (pediatric)    Pain in joint, lower leg    Septic joint of left knee joint ([place]) 04/22/2019   Spinal stenosis, lumbar region, without neurogenic claudication    Thoracic or lumbosacral neuritis or radiculitis, unspecified    Type II or unspecified type diabetes mellitus without mention of complication, not stated as uncontrolled    Unspecified cardiovascular disease    Unspecified essential hypertension     Patient Active Problem List   Diagnosis Date Noted   Acute respiratory failure with hypercapnia ([place]) 02/14/2021   Bilious emesis 02/14/2021   Acute hypercapnic respiratory failure ([place]) 02/14/2021   Hyponatremia 11/11/2020   Falls 10/30/2020   Dehydration with hyponatremia 10/29/2020   Fall at home, initial encounter 10/29/2020   Musculoskeletal chest pain 10/29/2020   Mixed hyperlipidemia due to type 2 diabetes mellitus ([place]) 10/29/2020   Benign prostatic hyperplasia without lower urinary tract symptoms 10/29/2020   Ileus  ([place])    Declining functional status    Goals of care, counseling/discussion    Palliative care by specialist    AKI (acute kidney injury) ([place]) 06/27/2020   Acute diarrhea 06/27/2020   Altered mental status 08/28/2019   Septic joint of left knee joint ([place]) 04/22/2019   Erectile dysfunction 10/30/2013   Essential hypertension, benign 10/30/2013   Coronary artery disease involving native coronary artery of native heart without angina pectoris 10/30/2013   Dizziness and giddiness    Pain in joint, lower leg    Type 2 diabetes mellitus without complication, with long-term current use of insulin (HCC)    Obstructive sleep apnea    Depressive disorder, not elsewhere classified    Essential and other specified forms of tremor    Headache(784.0)    Spinal stenosis, lumbar region, without neurogenic claudication    Thoracic or lumbosacral neuritis or radiculitis, unspecified    Chronic pain syndrome     Past Surgical History:  Procedure Laterality Date   BACK SURGERY     back surgey     times 3   IR INJECT/THERA/INC NEEDLE/CATH/PLC EPI/LUMB/SAC W/IMG  11/26/2020       Family History  Problem Relation Age of Onset   Cancer Father    Heart disease Father    Stroke Mother    Diabetes Mother     Social History   Tobacco Use   Smoking status: Former   Smokeless tobacco: Never  Scientific laboratory technician Use: Never used  Substance Use Topics   Alcohol use: No  Drug use: No    Home Medications Prior to Admission medications   Medication Sig Start Date End Date Taking? Authorizing Provider  acetaminophen (TYLENOL) 500 MG tablet Take 2 tablets (1,000 mg total) by mouth every 6 (six) hours as needed for headache, mild pain or fever. 11/28/20   Dwyane Dee, MD  albuterol (VENTOLIN HFA) 108 (90 Base) MCG/ACT inhaler Inhale 2 puffs into the lungs every 4 (four) hours as needed for wheezing or shortness of breath. 11/03/20   Pokhrel, Corrie Mckusick, MD  amLODipine (NORVASC) 10 MG tablet Take 1  tablet (10 mg total) by mouth daily. 11/29/20   Dwyane Dee, MD  atorvastatin (LIPITOR) 80 MG tablet Take 80 mg by mouth daily. 12/14/20   [provider]  celecoxib (CELEBREX) 200 MG capsule Take 200 mg by mouth daily.    [provider]  cloNIDine (CATAPRES) 0.1 MG tablet Take 0.1 mg by mouth 2 (two) times daily.     [provider]  diclofenac Sodium (VOLTAREN) 1 % GEL Apply 2 g topically 4 (four) times daily. 04/29/21   Couture, Cortni S, PA-C  DULoxetine (CYMBALTA) 30 MG capsule Take 1 capsule (30 mg total) by mouth 2 (two) times daily. 02/24/21   Aline August, MD  ferrous sulfate 325 (65 FE) MG tablet Take 325 mg by mouth daily.     [provider]  folic acid (FOLVITE) 1 MG tablet Take 1 tablet (1 mg total) by mouth daily. 02/25/21   Aline August, MD  gabapentin (NEURONTIN) 300 MG capsule Take 1 capsule (300 mg total) by mouth 3 (three) times daily. Patient taking differently: Take 600 mg by mouth 3 (three) times daily. (Take with 100mg  to equal a total dose of 700mg .) 11/28/20   Dwyane Dee, MD  hydroxychloroquine (PLAQUENIL) 200 MG tablet Take 200 mg by mouth 2 (two) times daily.    [provider]  lidocaine (LIDODERM) 5 % Place 1 patch onto the skin daily. Remove & Discard patch within 12 hours or as directed by MD. Right chest Patient taking differently: Place 1 patch onto the skin daily. Remove & Discard patch within 12 hours or as directed by MD. Apply one patch to each knee. 11/03/20   Pokhrel, Corrie Mckusick, MD  linaclotide (LINZESS) 72 MCG capsule Take 72 mcg by mouth daily as needed (constipation).    [provider]  loperamide (IMODIUM A-D) 2 MG tablet Take 1 tablet (2 mg total) by mouth 4 (four) times daily as needed for diarrhea or loose stools. 07/04/20   Georgette Shell, MD  LORazepam (ATIVAN) 1 MG tablet Take 0.5 tablets (0.5 mg total) by mouth every 8 (eight) hours as needed for anxiety. 02/24/21   Aline August, MD  Menthol,  Topical Analgesic, (BIOFREEZE EX) Apply 1 application topically daily. To back of neck and lower back.    [provider]  methocarbamol (ROBAXIN) 500 MG tablet Take 1 tablet (500 mg total) by mouth 2 (two) times daily as needed for muscle spasms (hold for sedation). 02/24/21   Aline August, MD  methocarbamol (ROBAXIN) 500 MG tablet Take 1 tablet (500 mg total) by mouth 2 (two) times daily. 05/04/21   Lacretia Leigh, MD  Multiple Vitamin (MULTIVITAMIN WITH MINERALS) TABS tablet Take 1 tablet by mouth daily. 11/29/20   Dwyane Dee, MD  ondansetron (ZOFRAN) 4 MG tablet Take 1 tablet (4 mg total) by mouth every 6 (six) hours as needed for nausea. 07/04/20   Georgette Shell, MD  oxyCODONE-acetaminophen (PERCOCET/ROXICET) [phone]  MG tablet Take 1 tablet by mouth every 6 (six) hours as needed for severe pain. 05/10/21   Hayden Rasmussen, MD  pantoprazole (PROTONIX) 40 MG tablet Take 1 tablet (40 mg total) by mouth daily. 11/29/20   Dwyane Dee, MD  polyethylene glycol (MIRALAX / GLYCOLAX) 17 g packet Take 17 g by mouth daily as needed for mild constipation. 11/03/20   Pokhrel, Corrie Mckusick, MD  rosuvastatin (CRESTOR) 40 MG tablet Take 40 mg by mouth every evening.     [provider]  SUMAtriptan (IMITREX) 25 MG tablet Take 25 mg by mouth daily as needed for migraine. May repeat in 2 hours if headache persists or recurs.    [provider]  tamsulosin (FLOMAX) 0.4 MG CAPS capsule Take 0.4 mg by mouth daily.    [provider]  thiamine 100 MG tablet Take 1 tablet (100 mg total) by mouth daily. 02/25/21   Aline August, MD    Allergies    Cefepime, Iohexol, Fentanyl, and Iodine  Review of Systems   Review of Systems  Constitutional:        Per HPI, otherwise negative  HENT:         Per HPI, otherwise negative  Respiratory:         Per HPI, otherwise negative  Cardiovascular:        Per HPI, otherwise negative  Gastrointestinal:  Negative for vomiting.  Endocrine:        Negative aside from HPI  Genitourinary:        Neg aside from HPI   Musculoskeletal:        Per HPI, otherwise negative  Skin: Negative.   Neurological:  Negative for syncope.   Physical Exam Updated Vital Signs There were no vitals taken for this visit.  Physical Exam Vitals and nursing note reviewed.  Constitutional:      General: He is not in acute distress.    Appearance: He is well-developed. He is not ill-appearing, toxic-appearing or diaphoretic.  HENT:     Head: Normocephalic and atraumatic.  Eyes:     Conjunctiva/sclera: Conjunctivae normal.  Cardiovascular:     Rate and Rhythm: Normal rate and regular rhythm.  Pulmonary:     Effort: Pulmonary effort is normal. No respiratory distress.     Breath sounds: No stridor.  Abdominal:     General: There is no distension.  Skin:    General: Skin is warm and dry.  Neurological:     Mental Status: He is alert and oriented to person, place, and time.    ED Results / Procedures / Treatments   Labs (all labs ordered are listed, but only abnormal results are displayed) Labs Reviewed - No data to display  EKG None  Radiology No results found.  Procedures Procedures   Medications Ordered in ED Medications  dexamethasone (DECADRON) injection 10 mg (has no administration in time range)  HYDROmorphone (DILAUDID) injection 1 mg (has no administration in time range)    ED Course  I have reviewed the triage vital signs and the nursing notes.  Pertinent labs & imaging results that were available during my care of the patient were reviewed by me and considered in my medical decision making (see chart for details).  6 prior ED visits in the past 6 months including 2 weeks ago after a fall, x-rays reviewed.  Update:, After initial Dilaudid patient continues to complain of pain, but moves all extremities, has no new deformities, and confirms he has had  no additional falls.  He now states that he has had difficulty  getting into his pain management clinic.  2:51 PM Patient has received several doses of intramuscular Dilaudid, gabapentin, requests Toradol.  He continues to be hemodynamically unremarkable, moving all extremities spontaneously, describing difficulty getting into pain management.  No fever, no hemodynamic instability, and absent complaint of new fall low suspicion for occult fracture, particularly since the patient moves all extremities spontaneously. Some suspicion for the patient's chronic pain being exacerbated given his otherwise reassuring vitals, physical exam.  Patient requested the doorbell previously, states that he will continue taking his medication, is already prescribed gabapentin.  Patient will start short course of steroids, for hoped additional anti-inflammatory benefit.  Patient discharged to his nursing facility. MDM Rules/Calculators/A&P MDM Number of Diagnoses or Management Options Pain: established, worsening   Amount and/or Complexity of Data Reviewed Clinical lab tests: reviewed Tests in the radiology section of CPT: reviewed Tests in the medicine section of CPT: reviewed Decide to obtain previous medical records or to obtain history from someone other than the patient: yes Review and summarize past medical records: yes Independent visualization of images, tracings, or specimens: yes  Risk of Complications, Morbidity, and/or Mortality Presenting problems: high Diagnostic procedures: high Management options: high  Critical Care Total time providing critical care: < 30 minutes  Patient Progress Patient progress: stable   Final Clinical Impression(s) / ED Diagnoses Final diagnoses:  Pain    Rx / DC Orders ED Discharge Orders          Ordered    predniSONE (DELTASONE) 20 MG tablet  Daily with breakfast        06/03/21 1450             Carmin Muskrat, MD 06/03/21 1453

## 2021-06-03 NOTE — Progress Notes (Addendum)
..  Transition of Care [facility]) - Emergency Department Mini Assessment   Patient Details  Name: Jorge Sherman MRN: [id number] Date of Birth: [birth year]  Transition of Care [facility] CM/SW Contact:    Illene Regulus, LCSW Phone Number: [phone] 06/03/2021, 10:06 AM   Clinical Narrative:  [facility] is a resident of [facility]. CSW spoke with pt POA his daughter Mcclain Shall ([phone]) she reported history of chronic pain, and has PT set up at the assistant living from previous fall. Ms Bearden reported its ok for him to return to [facility]. CSW contacted [facility] and spoke with [facility], who reported pt was complaining of pain and requested to go to the hospital. CSW confirmed pt can return to [facility]. CSW contact PTAR upon d/c.     ED Mini Assessment: What brought you to the Emergency Department? : Pain mangement  Barriers to Discharge: No Barriers Identified     Means of departure: Ambulance  Interventions which prevented an admission or readmission: [place] or Services    Patient Contact and Communications        ,                 Admission diagnosis:  back pain Patient Active Problem List   Diagnosis Date Noted   Acute respiratory failure with hypercapnia ([place]) 02/14/2021   Bilious emesis 02/14/2021   Acute hypercapnic respiratory failure ([place]) 02/14/2021   Hyponatremia 11/11/2020   Falls 10/30/2020   Dehydration with hyponatremia 10/29/2020   Fall at home, initial encounter 10/29/2020   Musculoskeletal chest pain 10/29/2020   Mixed hyperlipidemia due to type 2 diabetes mellitus ([place]) 10/29/2020   Benign prostatic hyperplasia without lower urinary tract symptoms 10/29/2020   Ileus ([place])    Declining functional status    Goals of care, counseling/discussion    Palliative care by specialist    AKI (acute kidney injury) ([place]) 06/27/2020   Acute diarrhea 06/27/2020   Altered mental status 08/28/2019    Septic joint of left knee joint ([place]) 04/22/2019   Erectile dysfunction 10/30/2013   Essential hypertension, benign 10/30/2013   Coronary artery disease involving native coronary artery of native heart without angina pectoris 10/30/2013   Dizziness and giddiness    Pain in joint, lower leg    Type 2 diabetes mellitus without complication, with long-term current use of insulin ([place])    Obstructive sleep apnea    Depressive disorder, not elsewhere classified    Essential and other specified forms of tremor    Headache(784.0)    Spinal stenosis, lumbar region, without neurogenic claudication    Thoracic or lumbosacral neuritis or radiculitis, unspecified    Chronic pain syndrome    PCP:  Raymondo Band, MD Pharmacy:   [facility], [place] - [address] [place] [address] Phone: [phone] Fax: [phone]

## 2021-06-03 NOTE — ED Triage Notes (Signed)
Per EMS, pt from [facility] assisted [facility] . He had fall weeks ago, was seen. He states he still has pain since then. He is a pt at pain management. He complains of pain all over. Takes vicodin and oxycodone but it's not helping. He is not sure if the nurse gave him his pain meds today  Vitals BP 120/70 HR 80 O2 93% CBG 113 Temp 97.3

## 2021-06-03 NOTE — ED Notes (Signed)
PT[facility] called to transport patient to [facility].

## 2021-06-03 NOTE — Discharge Instructions (Addendum)
As discussed, it is very important that you follow-up with pain management as an outpatient.  Although you continue to have pain, there is no evidence today for other acute changes, and no report of fall since her most recent emergency department evaluation.  X-rays at that point were reassuring. Please take all medication as directed, do not hesitate to return here for concerning changes.

## 2021-06-27 ENCOUNTER — Other Ambulatory Visit: Payer: Self-pay

## 2021-06-27 ENCOUNTER — Encounter (HOSPITAL_BASED_OUTPATIENT_CLINIC_OR_DEPARTMENT_OTHER): Payer: Self-pay

## 2021-06-27 ENCOUNTER — Emergency Department (HOSPITAL_BASED_OUTPATIENT_CLINIC_OR_DEPARTMENT_OTHER)
Admission: EM | Admit: 2021-06-27 | Discharge: 2021-06-27 | Disposition: A | Discharge status: Home / Self Care | Payer: Medicare HMO | Attending: Emergency Medicine | Admitting: Emergency Medicine

## 2021-06-27 DIAGNOSIS — M25512 Pain in left shoulder: Secondary | ICD-10-CM | POA: Insufficient documentation

## 2021-06-27 DIAGNOSIS — M545 Low back pain, unspecified: Secondary | ICD-10-CM | POA: Diagnosis not present

## 2021-06-27 DIAGNOSIS — M25511 Pain in right shoulder: Secondary | ICD-10-CM | POA: Diagnosis not present

## 2021-06-27 DIAGNOSIS — Z79899 Other long term (current) drug therapy: Secondary | ICD-10-CM | POA: Insufficient documentation

## 2021-06-27 DIAGNOSIS — M79605 Pain in left leg: Secondary | ICD-10-CM | POA: Insufficient documentation

## 2021-06-27 DIAGNOSIS — E1169 Type 2 diabetes mellitus with other specified complication: Secondary | ICD-10-CM | POA: Diagnosis not present

## 2021-06-27 DIAGNOSIS — I10 Essential (primary) hypertension: Secondary | ICD-10-CM | POA: Diagnosis not present

## 2021-06-27 DIAGNOSIS — G8929 Other chronic pain: Secondary | ICD-10-CM | POA: Insufficient documentation

## 2021-06-27 DIAGNOSIS — Z87891 Personal history of nicotine dependence: Secondary | ICD-10-CM | POA: Diagnosis not present

## 2021-06-27 DIAGNOSIS — I2510 Atherosclerotic heart disease of native coronary artery without angina pectoris: Secondary | ICD-10-CM | POA: Diagnosis not present

## 2021-06-27 DIAGNOSIS — E785 Hyperlipidemia, unspecified: Secondary | ICD-10-CM | POA: Diagnosis not present

## 2021-06-27 MED ORDER — HYDROCODONE-ACETAMINOPHEN 5-325 MG PO TABS
2.0000 | ORAL_TABLET | Freq: Once | ORAL | Status: AC
  Administered 2021-06-27: 2 via ORAL
  Filled 2021-06-27: qty 2

## 2021-06-27 MED ORDER — MORPHINE SULFATE (PF) 4 MG/ML IV SOLN
4.0000 mg | Freq: Once | INTRAVENOUS | Status: AC
Start: 2021-06-27 — End: 2021-06-27
  Administered 2021-06-27: 4 mg via INTRAVENOUS
  Filled 2021-06-27: qty 1

## 2021-06-27 MED ORDER — DEXAMETHASONE SODIUM PHOSPHATE 10 MG/ML IJ SOLN
10.0000 mg | Freq: Once | INTRAMUSCULAR | Status: AC
  Administered 2021-06-27: 10 mg via INTRAVENOUS
  Filled 2021-06-27: qty 1

## 2021-06-27 MED ORDER — MORPHINE SULFATE (PF) 4 MG/ML IV SOLN
4.0000 mg | Freq: Once | INTRAVENOUS | Status: AC
  Administered 2021-06-27: 4 mg via INTRAVENOUS
  Filled 2021-06-27: qty 1

## 2021-06-27 MED ORDER — KETOROLAC TROMETHAMINE 15 MG/ML IJ SOLN
15.0000 mg | Freq: Once | INTRAMUSCULAR | Status: AC
  Administered 2021-06-27: 15 mg via INTRAVENOUS
  Filled 2021-06-27: qty 1

## 2021-06-27 NOTE — ED Notes (Signed)
Report given to [facility]

## 2021-06-27 NOTE — ED Notes (Signed)
Patient placed on 2L Jorge Sherman while sleeping.

## 2021-06-27 NOTE — ED Notes (Signed)
Multiple unsuccessful attempts at report handoff with facility.   [employer]  [phone]

## 2021-06-27 NOTE — Discharge Instructions (Addendum)
You need to work with your primary care provider to adjust your medications for your pain.
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
## 2021-06-27 NOTE — ED Triage Notes (Addendum)
Pt arrives EMS from [facility] assisted [facility] with c/o generalized pain worse in lower back. Pain has been increasing for several days. Takes hydrocodone 7.25-325 Q6H.

## 2021-07-19 ENCOUNTER — Encounter (HOSPITAL_BASED_OUTPATIENT_CLINIC_OR_DEPARTMENT_OTHER): Payer: Self-pay | Admitting: *Deleted

## 2021-07-19 ENCOUNTER — Other Ambulatory Visit: Payer: Self-pay

## 2021-07-19 ENCOUNTER — Emergency Department (HOSPITAL_BASED_OUTPATIENT_CLINIC_OR_DEPARTMENT_OTHER)
Admission: EM | Admit: 2021-07-19 | Discharge: 2021-07-19 | Disposition: A | Discharge status: Home / Self Care | Payer: Medicare HMO | Attending: Emergency Medicine | Admitting: Emergency Medicine

## 2021-07-19 ENCOUNTER — Emergency Department (HOSPITAL_BASED_OUTPATIENT_CLINIC_OR_DEPARTMENT_OTHER): Payer: Medicare HMO

## 2021-07-19 DIAGNOSIS — R600 Localized edema: Secondary | ICD-10-CM | POA: Diagnosis present

## 2021-07-19 DIAGNOSIS — M79604 Pain in right leg: Secondary | ICD-10-CM | POA: Diagnosis not present

## 2021-07-19 DIAGNOSIS — E119 Type 2 diabetes mellitus without complications: Secondary | ICD-10-CM | POA: Diagnosis not present

## 2021-07-19 DIAGNOSIS — Z79899 Other long term (current) drug therapy: Secondary | ICD-10-CM | POA: Insufficient documentation

## 2021-07-19 DIAGNOSIS — M79605 Pain in left leg: Secondary | ICD-10-CM | POA: Diagnosis not present

## 2021-07-19 DIAGNOSIS — I10 Essential (primary) hypertension: Secondary | ICD-10-CM | POA: Diagnosis not present

## 2021-07-19 DIAGNOSIS — I2510 Atherosclerotic heart disease of native coronary artery without angina pectoris: Secondary | ICD-10-CM | POA: Diagnosis not present

## 2021-07-19 DIAGNOSIS — Z87891 Personal history of nicotine dependence: Secondary | ICD-10-CM | POA: Insufficient documentation

## 2021-07-19 DIAGNOSIS — G8929 Other chronic pain: Secondary | ICD-10-CM

## 2021-07-19 DIAGNOSIS — R0602 Shortness of breath: Secondary | ICD-10-CM | POA: Diagnosis not present

## 2021-07-19 LAB — BRAIN NATRIURETIC PEPTIDE: B Natriuretic Peptide: 39.6 pg/mL (ref 0.0–100.0)

## 2021-07-19 LAB — BASIC METABOLIC PANEL
Anion gap: 9 (ref 5–15)
BUN: 25 mg/dL — ABNORMAL HIGH (ref 8–23)
CO2: 29 mmol/L (ref 22–32)
Calcium: 9 mg/dL (ref 8.9–10.3)
Chloride: 99 mmol/L (ref 98–111)
Creatinine, Ser: 0.95 mg/dL (ref 0.61–1.24)
GFR, Estimated: >60 mL/min (ref >60)
Glucose, Bld: 122 mg/dL — ABNORMAL HIGH (ref 70–99)
Potassium: 4.1 mmol/L (ref 3.5–5.1)
Sodium: 137 mmol/L (ref 135–145)

## 2021-07-19 LAB — TROPONIN I (HIGH SENSITIVITY)
Troponin I (High Sensitivity): 4 ng/L (ref <18)
Troponin I (High Sensitivity): 4 ng/L (ref <18)

## 2021-07-19 LAB — CBC WITH DIFFERENTIAL/PLATELET
Abs Immature Granulocytes: 0.12 10*3/uL — ABNORMAL HIGH (ref 0.00–0.07)
Basophils Absolute: 0.1 10*3/uL (ref 0.0–0.1)
Basophils Relative: 1 %
Eosinophils Absolute: 0.3 10*3/uL (ref 0.0–0.5)
Eosinophils Relative: 3 %
HCT: 31.7 % — ABNORMAL LOW (ref 39.0–52.0)
Hemoglobin: 10.1 g/dL — ABNORMAL LOW (ref 13.0–17.0)
Immature Granulocytes: 1 %
Lymphocytes Relative: 11 %
Lymphs Abs: 1.4 10*3/uL (ref 0.7–4.0)
MCH: 30.1 pg (ref 26.0–34.0)
MCHC: 31.9 g/dL (ref 30.0–36.0)
MCV: 94.6 fL (ref 80.0–100.0)
Monocytes Absolute: 1.4 10*3/uL — ABNORMAL HIGH (ref 0.1–1.0)
Monocytes Relative: 12 %
Neutro Abs: 8.8 10*3/uL — ABNORMAL HIGH (ref 1.7–7.7)
Neutrophils Relative %: 72 %
Platelets: 297 10*3/uL (ref 150–400)
RBC: 3.35 MIL/uL — ABNORMAL LOW (ref 4.22–5.81)
RDW: 14.2 % (ref 11.5–15.5)
WBC: 12.1 10*3/uL — ABNORMAL HIGH (ref 4.0–10.5)
nRBC: 0 % (ref 0.0–0.2)

## 2021-07-19 MED ORDER — METHYLPREDNISOLONE SODIUM SUCC 125 MG IJ SOLR
125.0000 mg | Freq: Once | INTRAMUSCULAR | Status: AC
  Administered 2021-07-19: 125 mg via INTRAVENOUS
  Filled 2021-07-19: qty 2

## 2021-07-19 MED ORDER — MORPHINE SULFATE (PF) 4 MG/ML IV SOLN
4.0000 mg | Freq: Once | INTRAVENOUS | Status: AC
Start: 2021-07-19 — End: 2021-07-19
  Administered 2021-07-19: 4 mg via INTRAVENOUS
  Filled 2021-07-19: qty 1

## 2021-07-19 MED ORDER — KETOROLAC TROMETHAMINE 30 MG/ML IJ SOLN
30.0000 mg | Freq: Once | INTRAMUSCULAR | Status: AC
  Administered 2021-07-19: 30 mg via INTRAVENOUS
  Filled 2021-07-19: qty 1

## 2021-07-19 MED ORDER — MORPHINE SULFATE (PF) 4 MG/ML IV SOLN
4.0000 mg | Freq: Once | INTRAVENOUS | Status: AC
  Administered 2021-07-19: 4 mg via INTRAVENOUS
  Filled 2021-07-19: qty 1

## 2021-07-19 NOTE — ED Notes (Signed)
Updated daughter on fathers condition by phone

## 2021-07-19 NOTE — ED Provider Notes (Signed)
[facility]PARTMENT Provider Note   CSN: [id number] Arrival date & time: 07/19/21  1640     History Chief Complaint  Patient presents with   Leg Swelling    Jorge Sherman is a 80 y.o. male.  HPI  80 year old male with past medical history of obesity, chronic shoulder/lower back pain, HTN presents the emergency department with acute on chronic chronic pain as well as swelling of the lower extremities with intermittent shortness of breath.  Patient states over the last 5 days he has noticed that his bilateral lower extremities have become more edematous, at times red and painful.  He has limited mobility but has had intermittent shortness of breath with some exertion.  Denies any chest or back pain.  No recent fever or illness.  Patient periodically comes to the emergency department for treatment of his acute on chronic pain.  Is otherwise following up as an outpatient from his facility.  Past Medical History:  Diagnosis Date   Chronic pain    Depressive disorder, not elsewhere classified    Dizziness and giddiness    Essential and other specified forms of tremor    Headache(784.0)    Leukocytosis, unspecified    Obstructive sleep apnea (adult) (pediatric)    Pain in joint, lower leg    Septic joint of left knee joint ([place]) 04/22/2019   Spinal stenosis, lumbar region, without neurogenic claudication    Thoracic or lumbosacral neuritis or radiculitis, unspecified    Type II or unspecified type diabetes mellitus without mention of complication, not stated as uncontrolled    Unspecified cardiovascular disease    Unspecified essential hypertension     Patient Active Problem List   Diagnosis Date Noted   Acute respiratory failure with hypercapnia ([place]) 02/14/2021   Bilious emesis 02/14/2021   Acute hypercapnic respiratory failure ([place]) 02/14/2021   Hyponatremia 11/11/2020   Falls 10/30/2020   Dehydration with hyponatremia 10/29/2020   Fall at home,  initial encounter 10/29/2020   Musculoskeletal chest pain 10/29/2020   Mixed hyperlipidemia due to type 2 diabetes mellitus ([place]) 10/29/2020   Benign prostatic hyperplasia without lower urinary tract symptoms 10/29/2020   Ileus ([place])    Declining functional status    Goals of care, counseling/discussion    Palliative care by specialist    AKI (acute kidney injury) ([place]) 06/27/2020   Acute diarrhea 06/27/2020   Altered mental status 08/28/2019   Septic joint of left knee joint ([place]) 04/22/2019   Erectile dysfunction 10/30/2013   Essential hypertension, benign 10/30/2013   Coronary artery disease involving native coronary artery of native heart without angina pectoris 10/30/2013   Dizziness and giddiness    Pain in joint, lower leg    Type 2 diabetes mellitus without complication, with long-term current use of insulin (HCC)    Obstructive sleep apnea    Depressive disorder, not elsewhere classified    Essential and other specified forms of tremor    Headache(784.0)    Spinal stenosis, lumbar region, without neurogenic claudication    Thoracic or lumbosacral neuritis or radiculitis, unspecified    Chronic pain syndrome     Past Surgical History:  Procedure Laterality Date   BACK SURGERY     back surgey     times 3   IR INJECT/THERA/INC NEEDLE/CATH/PLC EPI/LUMB/SAC W/IMG  11/26/2020       Family History  Problem Relation Age of Onset   Cancer Father    Heart disease Father    Stroke Mother  Diabetes Mother     Social History   Tobacco Use   Smoking status: Former   Smokeless tobacco: Never  Scientific laboratory technician Use: Never used  Substance Use Topics   Alcohol use: No   Drug use: No    Home Medications Prior to Admission medications   Medication Sig Start Date End Date Taking? Authorizing Provider  acetaminophen (TYLENOL) 500 MG tablet Take 2 tablets (1,000 mg total) by mouth every 6 (six) hours as needed for headache, mild pain or fever. 11/28/20   Dwyane Dee,  MD  albuterol (VENTOLIN HFA) 108 (90 Base) MCG/ACT inhaler Inhale 2 puffs into the lungs every 4 (four) hours as needed for wheezing or shortness of breath. 11/03/20   Pokhrel, Corrie Mckusick, MD  amLODipine (NORVASC) 10 MG tablet Take 1 tablet (10 mg total) by mouth daily. 11/29/20   Dwyane Dee, MD  atorvastatin (LIPITOR) 80 MG tablet Take 80 mg by mouth daily. 12/14/20   [provider]  celecoxib (CELEBREX) 200 MG capsule Take 200 mg by mouth daily.    [provider]  cloNIDine (CATAPRES) 0.1 MG tablet Take 0.1 mg by mouth 2 (two) times daily.     [provider]  diclofenac Sodium (VOLTAREN) 1 % GEL Apply 2 g topically 4 (four) times daily. 04/29/21   Couture, Cortni S, PA-C  DULoxetine (CYMBALTA) 30 MG capsule Take 1 capsule (30 mg total) by mouth 2 (two) times daily. 02/24/21   Aline August, MD  ferrous sulfate 325 (65 FE) MG tablet Take 325 mg by mouth daily.     [provider]  folic acid (FOLVITE) 1 MG tablet Take 1 tablet (1 mg total) by mouth daily. 02/25/21   Aline August, MD  gabapentin (NEURONTIN) 300 MG capsule Take 1 capsule (300 mg total) by mouth 3 (three) times daily. Patient taking differently: Take 600 mg by mouth 3 (three) times daily. (Take with '100mg'$  to equal a total dose of '700mg'$ .) 11/28/20   Dwyane Dee, MD  hydroxychloroquine (PLAQUENIL) 200 MG tablet Take 200 mg by mouth 2 (two) times daily.    [provider]  lidocaine (LIDODERM) 5 % Place 1 patch onto the skin daily. Remove & Discard patch within 12 hours or as directed by MD. Right chest Patient taking differently: Place 1 patch onto the skin daily. Remove & Discard patch within 12 hours or as directed by MD. Apply one patch to each knee. 11/03/20   Pokhrel, Corrie Mckusick, MD  linaclotide (LINZESS) 72 MCG capsule Take 72 mcg by mouth daily as needed (constipation).    [provider]  loperamide (IMODIUM A-D) 2 MG tablet Take 1 tablet (2 mg total) by mouth 4 (four) times daily  as needed for diarrhea or loose stools. 07/04/20   Georgette Shell, MD  LORazepam (ATIVAN) 1 MG tablet Take 0.5 tablets (0.5 mg total) by mouth every 8 (eight) hours as needed for anxiety. 02/24/21   Aline August, MD  Menthol, Topical Analgesic, (BIOFREEZE EX) Apply 1 application topically daily. To back of neck and lower back.    [provider]  methocarbamol (ROBAXIN) 500 MG tablet Take 1 tablet (500 mg total) by mouth 2 (two) times daily as needed for muscle spasms (hold for sedation). 02/24/21   Aline August, MD  methocarbamol (ROBAXIN) 500 MG tablet Take 1 tablet (500 mg total) by mouth 2 (two) times daily. 05/04/21   Lacretia Leigh, MD  Multiple Vitamin (MULTIVITAMIN WITH MINERALS) TABS tablet Take 1 tablet  by mouth daily. 11/29/20   Dwyane Dee, MD  ondansetron (ZOFRAN) 4 MG tablet Take 1 tablet (4 mg total) by mouth every 6 (six) hours as needed for nausea. 07/04/20   Georgette Shell, MD  oxyCODONE-acetaminophen (PERCOCET/ROXICET) 5-325 MG tablet Take 1 tablet by mouth every 6 (six) hours as needed for severe pain. 05/10/21   Hayden Rasmussen, MD  pantoprazole (PROTONIX) 40 MG tablet Take 1 tablet (40 mg total) by mouth daily. 11/29/20   Dwyane Dee, MD  polyethylene glycol (MIRALAX / GLYCOLAX) 17 g packet Take 17 g by mouth daily as needed for mild constipation. 11/03/20   Pokhrel, Corrie Mckusick, MD  predniSONE (DELTASONE) 20 MG tablet Take 2 tablets (40 mg total) by mouth daily with breakfast. For the next four days 06/03/21   Carmin Muskrat, MD  rosuvastatin (CRESTOR) 40 MG tablet Take 40 mg by mouth every evening.     [provider]  SUMAtriptan (IMITREX) 25 MG tablet Take 25 mg by mouth daily as needed for migraine. May repeat in 2 hours if headache persists or recurs.    [provider]  tamsulosin (FLOMAX) 0.4 MG CAPS capsule Take 0.4 mg by mouth daily.    [provider]  thiamine 100 MG tablet Take 1 tablet (100 mg total) by mouth daily. 02/25/21    Aline August, MD    Allergies    Cefepime, Iohexol, Fentanyl, and Iodine  Review of Systems   Review of Systems  Constitutional:  Positive for fatigue. Negative for chills and fever.  HENT:  Negative for congestion.   Eyes:  Negative for visual disturbance.  Respiratory:  Positive for shortness of breath.   Cardiovascular:  Positive for leg swelling. Negative for chest pain and palpitations.  Gastrointestinal:  Negative for abdominal pain, diarrhea and vomiting.  Genitourinary:  Negative for dysuria.  Musculoskeletal:  Positive for back pain. Negative for neck pain.       BLE edema, b/l shoulder pain  Skin:  Negative for rash.  Neurological:  Negative for headaches.   Physical Exam Updated Vital Signs BP 117/64   Pulse 91   Temp 98.3 F (36.8 C) (Oral)   Resp 18   SpO2 95%   Physical Exam Vitals and nursing note reviewed.  Constitutional:      Appearance: Normal appearance. He is obese.  HENT:     Head: Normocephalic.     Mouth/Throat:     Mouth: Mucous membranes are moist.  Cardiovascular:     Rate and Rhythm: Normal rate.  Pulmonary:     Effort: Pulmonary effort is normal. No respiratory distress.     Comments: Limited by body habitus but decreased breath sounds at bases Abdominal:     Palpations: Abdomen is soft.     Tenderness: There is no abdominal tenderness.  Musculoskeletal:        General: Swelling present.     Comments: 2+ pitting edema of the bilateral lower extremities without any erythema/cellulitis, normal peripheral perfusion  Skin:    General: Skin is warm.  Neurological:     Mental Status: He is alert and oriented to person, place, and time. Mental status is at baseline.  Psychiatric:        Mood and Affect: Mood normal.    ED Results / Procedures / Treatments   Labs (all labs ordered are listed, but only abnormal results are displayed) Labs Reviewed  CBC WITH DIFFERENTIAL/PLATELET  BASIC METABOLIC PANEL  BRAIN NATRIURETIC PEPTIDE   TROPONIN I (  HIGH SENSITIVITY)    EKG EKG Interpretation  Date/Time:  Monday July 19 2021 16:43:51 EDT Ventricular Rate:  92 PR Interval:    QRS Duration: 100 QT Interval:  375 QTC Calculation: 464 R Axis:   -28 Text Interpretation: Accelerated junctional rhythm Borderline left axis deviation Low voltage, precordial leads NSR, PR prolongation Confirmed by Lavenia Atlas [phone]) on 07/19/2021 5:43:25 PM  Radiology No results found.  Procedures Procedures   Medications Ordered in ED Medications  ketorolac (TORADOL) 30 MG/ML injection 30 mg (has no administration in time range)  morphine 4 MG/ML injection 4 mg (has no administration in time range)    ED Course  I have reviewed the triage vital signs and the nursing notes.  Pertinent labs & imaging results that were available during my care of the patient were reviewed by me and considered in my medical decision making (see chart for details).    MDM Rules/Calculators/A&P                           80 year old male presents emergency department with acute on chronic shoulder/back/body pain as well as swelling of the lower extremities for the last 5 days.  He said intermittent episodes of shortness of breath but denies any active chest pain or difficulty breathing.  No history of DVT/PE.  EKG shows first-degree AV block which is baseline.  Vitals are stable.  Cardiac work-up is negative, BNP is normal, chest x-ray shows no acute pulmonary edema.  Ultrasound lower extremity shows no DVT, does identify bilateral Baker's cyst.  Rest of his work-up is baseline.  Patient was treated symptomatically and will plan for outpatient follow-up.  Patient at this time appears safe and stable for discharge and will be treated as an outpatient.  Discharge plan and strict return to ED precautions discussed, patient verbalizes understanding and agreement.  Final Clinical Impression(s) / ED Diagnoses Final diagnoses:  None    Rx / DC  Orders ED Discharge Orders     None        Lorelle Gibbs, DO 07/19/21 2146

## 2021-07-19 NOTE — ED Triage Notes (Addendum)
Pt arrived ems w c/o shoulder and back chronic and bilateral lower ext swelling x 5 days  Lower ext w some redness,  States they gave him 1 lasix   States if he lad some steroids and morphine he would make the shoulders feel better

## 2021-07-19 NOTE — Discharge Instructions (Addendum)
You have been seen and discharged from the emergency department.  Your ultrasound showed no DVT, did identify Baker's cyst which can also cause lower leg swelling.  The rest of your work-up was normal.  Follow-up with your primary provider for reevaluation and further care. Take home medications as prescribed. If you have any worsening symptoms or further concerns for your health please return to an emergency department for further evaluation.

## 2021-08-09 ENCOUNTER — Encounter (HOSPITAL_BASED_OUTPATIENT_CLINIC_OR_DEPARTMENT_OTHER): Payer: Self-pay

## 2021-08-09 ENCOUNTER — Other Ambulatory Visit: Payer: Self-pay

## 2021-08-09 ENCOUNTER — Emergency Department (HOSPITAL_BASED_OUTPATIENT_CLINIC_OR_DEPARTMENT_OTHER): Payer: Medicare HMO

## 2021-08-09 ENCOUNTER — Emergency Department (HOSPITAL_BASED_OUTPATIENT_CLINIC_OR_DEPARTMENT_OTHER)
Admission: EM | Admit: 2021-08-09 | Discharge: 2021-08-09 | Disposition: A | Discharge status: Home / Self Care | Payer: Medicare HMO | Attending: Emergency Medicine | Admitting: Emergency Medicine

## 2021-08-09 DIAGNOSIS — Z79899 Other long term (current) drug therapy: Secondary | ICD-10-CM | POA: Diagnosis not present

## 2021-08-09 DIAGNOSIS — Z87891 Personal history of nicotine dependence: Secondary | ICD-10-CM | POA: Insufficient documentation

## 2021-08-09 DIAGNOSIS — I10 Essential (primary) hypertension: Secondary | ICD-10-CM | POA: Insufficient documentation

## 2021-08-09 DIAGNOSIS — I2510 Atherosclerotic heart disease of native coronary artery without angina pectoris: Secondary | ICD-10-CM | POA: Insufficient documentation

## 2021-08-09 DIAGNOSIS — M79605 Pain in left leg: Secondary | ICD-10-CM

## 2021-08-09 DIAGNOSIS — E785 Hyperlipidemia, unspecified: Secondary | ICD-10-CM | POA: Diagnosis not present

## 2021-08-09 DIAGNOSIS — E1169 Type 2 diabetes mellitus with other specified complication: Secondary | ICD-10-CM | POA: Insufficient documentation

## 2021-08-09 LAB — CBC WITH DIFFERENTIAL/PLATELET
Abs Immature Granulocytes: 0.06 10*3/uL (ref 0.00–0.07)
Basophils Absolute: 0.1 10*3/uL (ref 0.0–0.1)
Basophils Relative: 1 %
Eosinophils Absolute: 0.2 10*3/uL (ref 0.0–0.5)
Eosinophils Relative: 2 %
HCT: 30.7 % — ABNORMAL LOW (ref 39.0–52.0)
Hemoglobin: 9.8 g/dL — ABNORMAL LOW (ref 13.0–17.0)
Immature Granulocytes: 1 %
Lymphocytes Relative: 16 %
Lymphs Abs: 1.7 10*3/uL (ref 0.7–4.0)
MCH: 30.2 pg (ref 26.0–34.0)
MCHC: 31.9 g/dL (ref 30.0–36.0)
MCV: 94.8 fL (ref 80.0–100.0)
Monocytes Absolute: 1.3 10*3/uL — ABNORMAL HIGH (ref 0.1–1.0)
Monocytes Relative: 12 %
Neutro Abs: 7.3 10*3/uL (ref 1.7–7.7)
Neutrophils Relative %: 68 %
Platelets: 296 10*3/uL (ref 150–400)
RBC: 3.24 MIL/uL — ABNORMAL LOW (ref 4.22–5.81)
RDW: 14.3 % (ref 11.5–15.5)
WBC: 10.5 10*3/uL (ref 4.0–10.5)
nRBC: 0 % (ref 0.0–0.2)

## 2021-08-09 LAB — BASIC METABOLIC PANEL
Anion gap: 7 (ref 5–15)
BUN: 35 mg/dL — ABNORMAL HIGH (ref 8–23)
CO2: 28 mmol/L (ref 22–32)
Calcium: 8.7 mg/dL — ABNORMAL LOW (ref 8.9–10.3)
Chloride: 100 mmol/L (ref 98–111)
Creatinine, Ser: 1.01 mg/dL (ref 0.61–1.24)
GFR, Estimated: >60 mL/min (ref >60)
Glucose, Bld: 92 mg/dL (ref 70–99)
Potassium: 4.7 mmol/L (ref 3.5–5.1)
Sodium: 135 mmol/L (ref 135–145)

## 2021-08-09 MED ORDER — METHOCARBAMOL 500 MG PO TABS
500.0000 mg | ORAL_TABLET | Freq: Once | ORAL | Status: AC
  Administered 2021-08-09: 500 mg via ORAL
  Filled 2021-08-09: qty 1

## 2021-08-09 MED ORDER — KETOROLAC TROMETHAMINE 15 MG/ML IJ SOLN
15.0000 mg | Freq: Once | INTRAMUSCULAR | Status: AC
  Administered 2021-08-09: 15 mg via INTRAVENOUS
  Filled 2021-08-09: qty 1

## 2021-08-09 MED ORDER — MORPHINE SULFATE (PF) 4 MG/ML IV SOLN
4.0000 mg | Freq: Once | INTRAVENOUS | Status: AC
  Administered 2021-08-09: 4 mg via INTRAVENOUS
  Filled 2021-08-09: qty 1

## 2021-08-09 MED ORDER — ONDANSETRON HCL 4 MG/2ML IJ SOLN
4.0000 mg | Freq: Once | INTRAMUSCULAR | Status: AC
  Administered 2021-08-09: 4 mg via INTRAVENOUS
  Filled 2021-08-09: qty 2

## 2021-08-09 MED ORDER — GABAPENTIN 300 MG PO CAPS
300.0000 mg | ORAL_CAPSULE | Freq: Once | ORAL | Status: AC
  Administered 2021-08-09: 300 mg via ORAL
  Filled 2021-08-09: qty 1

## 2021-08-09 NOTE — ED Notes (Signed)
MD at the bedside  

## 2021-08-09 NOTE — ED Triage Notes (Signed)
BIB EMS for left leg pain that started 2 days ago. Denies falling or other injury. States had bilateral leg swelling 2 days ago. Denies fevers, +SOB, -CP. Pain described as burning, throbbing, and stinging from hip to toes. Alert and oriented x4.

## 2021-08-09 NOTE — ED Notes (Signed)
Pt requesting dilaudid for pain control. MD made aware.

## 2021-08-09 NOTE — ED Provider Notes (Signed)
Care of patient assumed from Dr. Stark Jock at 7 AM.  Patient presents for what seems to be acute on chronic left leg pain.  He we will get a ultrasound study after 8 AM for assessment of the DVT.  If negative, he can be discharged home.  Physical Exam  BP 114/86   Pulse 85   Temp 98.7 F (37.1 C) (Oral)   Resp 20   Wt 104.3 kg   SpO2 96%   BMI 33.00 kg/m   Physical Exam Constitutional:      General: He is not in acute distress.    Appearance: He is not toxic-appearing.  Musculoskeletal:     Right lower leg: Edema present.     Left lower leg: Edema present.  Neurological:     General: No focal deficit present.     Mental Status: He is alert and oriented to person, place, and time.  Psychiatric:        Mood and Affect: Mood normal.    ED Course/Procedures     Procedures  MDM  While awaiting left lower extremity ultrasound study, patient requested Dilaudid.  During previous shift, he did get morphine for pain control.  On review of his medications, it does appear the patient was on Suboxone.  Patient states that he is currently not on Suboxone.  He does state that he takes daily Klonopin as well as oxycodone.  Patient was informed of the risks of polypharmacy with these drugs at his age.  He was also informed that giving him Dilaudid to treat his chronic pain would be inappropriate.  Patient was ordered his home doses of gabapentin and Robaxin.  Ultrasound study was negative.  Patient was discharged in stable condition.       Godfrey Pick, MD 08/09/21 [phone]

## 2021-08-09 NOTE — ED Notes (Signed)
Patient Alert and oriented to baseline. Stable and ambulatory to baseline. Patient verbalized understanding of the discharge instructions.  Patient belongings were taken by the patient.   

## 2021-08-09 NOTE — ED Provider Notes (Signed)
[facility] Provider Note   CSN: [id number] Arrival date & time: 08/09/21  0602     History Chief Complaint  Patient presents with   Leg Pain    Jorge Sherman is a 80 y.o. male.  Patient is an 80 year old male with past medical history of diabetes, hypertension, obstructive sleep apnea, chronic pain.  Patient presenting today with complaints of left leg pain.  He describes pain that starts in his hip and radiates all the way down into his foot.  This has been worsening over the past several days.  He denies any chest pain or difficulty breathing.  He does describe some swelling in both legs.  The history is provided by the patient.  Leg Pain Location:  Hip, leg, knee, ankle, foot and toe Injury: no   Pain details:    Quality:  Aching   Severity:  Moderate   Onset quality:  Gradual   Duration:  3 days   Timing:  Constant   Progression:  Worsening Chronicity:  Recurrent     Past Medical History:  Diagnosis Date   Chronic pain    Depressive disorder, not elsewhere classified    Dizziness and giddiness    Essential and other specified forms of tremor    Headache(784.0)    Leukocytosis, unspecified    Obstructive sleep apnea (adult) (pediatric)    Pain in joint, lower leg    Septic joint of left knee joint ([place]) 04/22/2019   Spinal stenosis, lumbar region, without neurogenic claudication    Thoracic or lumbosacral neuritis or radiculitis, unspecified    Type II or unspecified type diabetes mellitus without mention of complication, not stated as uncontrolled    Unspecified cardiovascular disease    Unspecified essential hypertension     Patient Active Problem List   Diagnosis Date Noted   Acute respiratory failure with hypercapnia ([place]) 02/14/2021   Bilious emesis 02/14/2021   Acute hypercapnic respiratory failure ([place]) 02/14/2021   Hyponatremia 11/11/2020   Falls 10/30/2020   Dehydration with hyponatremia 10/29/2020   Fall at home,  initial encounter 10/29/2020   Musculoskeletal chest pain 10/29/2020   Mixed hyperlipidemia due to type 2 diabetes mellitus ([place]) 10/29/2020   Benign prostatic hyperplasia without lower urinary tract symptoms 10/29/2020   Ileus ([place])    Declining functional status    Goals of care, counseling/discussion    Palliative care by specialist    AKI (acute kidney injury) ([place]) 06/27/2020   Acute diarrhea 06/27/2020   Altered mental status 08/28/2019   Septic joint of left knee joint ([place]) 04/22/2019   Erectile dysfunction 10/30/2013   Essential hypertension, benign 10/30/2013   Coronary artery disease involving native coronary artery of native heart without angina pectoris 10/30/2013   Dizziness and giddiness    Pain in joint, lower leg    Type 2 diabetes mellitus without complication, with long-term current use of insulin (HCC)    Obstructive sleep apnea    Depressive disorder, not elsewhere classified    Essential and other specified forms of tremor    Headache(784.0)    Spinal stenosis, lumbar region, without neurogenic claudication    Thoracic or lumbosacral neuritis or radiculitis, unspecified    Chronic pain syndrome     Past Surgical History:  Procedure Laterality Date   BACK SURGERY     back surgey     times 3   IR INJECT/THERA/INC NEEDLE/CATH/PLC EPI/LUMB/SAC W/IMG  11/26/2020       Family History  Problem  Relation Age of Onset   Cancer Father    Heart disease Father    Stroke Mother    Diabetes Mother     Social History   Tobacco Use   Smoking status: Former   Smokeless tobacco: Never  Scientific laboratory technician Use: Never used  Substance Use Topics   Alcohol use: No   Drug use: No    Home Medications Prior to Admission medications   Medication Sig Start Date End Date Taking? Authorizing Provider  acetaminophen (TYLENOL) 500 MG tablet Take 2 tablets (1,000 mg total) by mouth every 6 (six) hours as needed for headache, mild pain or fever. 11/28/20   Dwyane Dee,  MD  albuterol (VENTOLIN HFA) 108 (90 Base) MCG/ACT inhaler Inhale 2 puffs into the lungs every 4 (four) hours as needed for wheezing or shortness of breath. 11/03/20   Pokhrel, Corrie Mckusick, MD  amLODipine (NORVASC) 10 MG tablet Take 1 tablet (10 mg total) by mouth daily. 11/29/20   Dwyane Dee, MD  atorvastatin (LIPITOR) 80 MG tablet Take 80 mg by mouth daily. 12/14/20   [provider]  celecoxib (CELEBREX) 200 MG capsule Take 200 mg by mouth daily.    [provider]  cloNIDine (CATAPRES) 0.1 MG tablet Take 0.1 mg by mouth 2 (two) times daily.     [provider]  diclofenac Sodium (VOLTAREN) 1 % GEL Apply 2 g topically 4 (four) times daily. 04/29/21   Couture, Cortni S, PA-C  DULoxetine (CYMBALTA) 30 MG capsule Take 1 capsule (30 mg total) by mouth 2 (two) times daily. 02/24/21   Aline August, MD  ferrous sulfate 325 (65 FE) MG tablet Take 325 mg by mouth daily.     [provider]  folic acid (FOLVITE) 1 MG tablet Take 1 tablet (1 mg total) by mouth daily. 02/25/21   Aline August, MD  gabapentin (NEURONTIN) 300 MG capsule Take 1 capsule (300 mg total) by mouth 3 (three) times daily. Patient taking differently: Take 600 mg by mouth 3 (three) times daily. (Take with '100mg'$  to equal a total dose of '700mg'$ .) 11/28/20   Dwyane Dee, MD  hydroxychloroquine (PLAQUENIL) 200 MG tablet Take 200 mg by mouth 2 (two) times daily.    [provider]  lidocaine (LIDODERM) 5 % Place 1 patch onto the skin daily. Remove & Discard patch within 12 hours or as directed by MD. Right chest Patient taking differently: Place 1 patch onto the skin daily. Remove & Discard patch within 12 hours or as directed by MD. Apply one patch to each knee. 11/03/20   Pokhrel, Corrie Mckusick, MD  linaclotide (LINZESS) 72 MCG capsule Take 72 mcg by mouth daily as needed (constipation).    [provider]  loperamide (IMODIUM A-D) 2 MG tablet Take 1 tablet (2 mg total) by mouth 4 (four) times daily  as needed for diarrhea or loose stools. 07/04/20   Georgette Shell, MD  LORazepam (ATIVAN) 1 MG tablet Take 0.5 tablets (0.5 mg total) by mouth every 8 (eight) hours as needed for anxiety. 02/24/21   Aline August, MD  Menthol, Topical Analgesic, (BIOFREEZE EX) Apply 1 application topically daily. To back of neck and lower back.    [provider]  methocarbamol (ROBAXIN) 500 MG tablet Take 1 tablet (500 mg total) by mouth 2 (two) times daily as needed for muscle spasms (hold for sedation). 02/24/21   Aline August, MD  methocarbamol (ROBAXIN) 500 MG tablet Take 1 tablet (500 mg total) by  mouth 2 (two) times daily. 05/04/21   Lacretia Leigh, MD  Multiple Vitamin (MULTIVITAMIN WITH MINERALS) TABS tablet Take 1 tablet by mouth daily. 11/29/20   Dwyane Dee, MD  ondansetron (ZOFRAN) 4 MG tablet Take 1 tablet (4 mg total) by mouth every 6 (six) hours as needed for nausea. 07/04/20   Georgette Shell, MD  oxyCODONE-acetaminophen (PERCOCET/ROXICET) 5-325 MG tablet Take 1 tablet by mouth every 6 (six) hours as needed for severe pain. 05/10/21   Hayden Rasmussen, MD  pantoprazole (PROTONIX) 40 MG tablet Take 1 tablet (40 mg total) by mouth daily. 11/29/20   Dwyane Dee, MD  polyethylene glycol (MIRALAX / GLYCOLAX) 17 g packet Take 17 g by mouth daily as needed for mild constipation. 11/03/20   Pokhrel, Corrie Mckusick, MD  predniSONE (DELTASONE) 20 MG tablet Take 2 tablets (40 mg total) by mouth daily with breakfast. For the next four days 06/03/21   Carmin Muskrat, MD  rosuvastatin (CRESTOR) 40 MG tablet Take 40 mg by mouth every evening.     [provider]  SUMAtriptan (IMITREX) 25 MG tablet Take 25 mg by mouth daily as needed for migraine. May repeat in 2 hours if headache persists or recurs.    [provider]  tamsulosin (FLOMAX) 0.4 MG CAPS capsule Take 0.4 mg by mouth daily.    [provider]  thiamine 100 MG tablet Take 1 tablet (100 mg total) by mouth daily. 02/25/21    Aline August, MD    Allergies    Cefepime, Iohexol, Fentanyl, and Iodine  Review of Systems   Review of Systems  All other systems reviewed and are negative.  Physical Exam Updated Vital Signs BP 114/86   Pulse 85   Temp 98.7 F (37.1 C) (Oral)   Resp 20   Wt 104.3 kg   SpO2 96%   BMI 33.00 kg/m   Physical Exam Vitals and nursing note reviewed.  Constitutional:      General: He is not in acute distress.    Appearance: He is well-developed. He is not diaphoretic.  HENT:     Head: Normocephalic and atraumatic.  Cardiovascular:     Rate and Rhythm: Normal rate and regular rhythm.     Heart sounds: No murmur heard.   No friction rub.  Pulmonary:     Effort: Pulmonary effort is normal. No respiratory distress.     Breath sounds: Normal breath sounds. No wheezing or rales.  Abdominal:     General: Bowel sounds are normal. There is no distension.     Palpations: Abdomen is soft.     Tenderness: There is no abdominal tenderness.  Musculoskeletal:        General: Normal range of motion.     Cervical back: Normal range of motion and neck supple.     Right lower leg: Edema present.     Left lower leg: Edema present.     Comments: There is 1+ pitting edema of both lower extremities.  There is tenderness in the left calf and popliteal fossa, but no palpable cords.  DP pulses are palpable bilaterally.  Skin:    General: Skin is warm and dry.  Neurological:     Mental Status: He is alert and oriented to person, place, and time.     Coordination: Coordination normal.    ED Results / Procedures / Treatments   Labs (all labs ordered are listed, but only abnormal results are displayed) Labs Reviewed - No data to display  EKG None  Radiology No results found.  Procedures Procedures   Medications Ordered in ED Medications  morphine 4 MG/ML injection 4 mg (has no administration in time range)  ondansetron (ZOFRAN) injection 4 mg (has no administration in time range)     ED Course  I have reviewed the triage vital signs and the nursing notes.  Pertinent labs & imaging results that were available during my care of the patient were reviewed by me and considered in my medical decision making (see chart for details).    MDM Rules/Calculators/A&P  Patient presenting with complaints of left leg pain.  He describes discomfort extending from the hip through the foot.  On exam the leg is neurovascularly intact.  He does have some calf and popliteal fossa tenderness.  Laboratory studies and ultrasound pending.  Care to be signed out to oncoming provider at shift change awaiting results.  Final Clinical Impression(s) / ED Diagnoses Final diagnoses:  None    Rx / DC Orders ED Discharge Orders     None        Veryl Speak, MD 08/09/21 [phone]

## 2021-09-05 ENCOUNTER — Emergency Department (HOSPITAL_BASED_OUTPATIENT_CLINIC_OR_DEPARTMENT_OTHER)
Admission: EM | Admit: 2021-09-05 | Discharge: 2021-09-05 | Disposition: A | Discharge status: Home / Self Care | Payer: Medicare HMO | Attending: Emergency Medicine | Admitting: Emergency Medicine

## 2021-09-05 ENCOUNTER — Encounter (HOSPITAL_BASED_OUTPATIENT_CLINIC_OR_DEPARTMENT_OTHER): Payer: Self-pay | Admitting: Emergency Medicine

## 2021-09-05 ENCOUNTER — Other Ambulatory Visit: Payer: Self-pay

## 2021-09-05 DIAGNOSIS — E119 Type 2 diabetes mellitus without complications: Secondary | ICD-10-CM | POA: Insufficient documentation

## 2021-09-05 DIAGNOSIS — Z87891 Personal history of nicotine dependence: Secondary | ICD-10-CM | POA: Diagnosis not present

## 2021-09-05 DIAGNOSIS — I2510 Atherosclerotic heart disease of native coronary artery without angina pectoris: Secondary | ICD-10-CM | POA: Diagnosis not present

## 2021-09-05 DIAGNOSIS — Z79899 Other long term (current) drug therapy: Secondary | ICD-10-CM | POA: Insufficient documentation

## 2021-09-05 DIAGNOSIS — M5432 Sciatica, left side: Secondary | ICD-10-CM | POA: Diagnosis not present

## 2021-09-05 DIAGNOSIS — M545 Low back pain, unspecified: Secondary | ICD-10-CM | POA: Diagnosis present

## 2021-09-05 DIAGNOSIS — I10 Essential (primary) hypertension: Secondary | ICD-10-CM | POA: Diagnosis not present

## 2021-09-05 MED ORDER — KETOROLAC TROMETHAMINE 15 MG/ML IJ SOLN
15.0000 mg | Freq: Once | INTRAMUSCULAR | Status: AC
  Administered 2021-09-05: 15 mg via INTRAVENOUS
  Filled 2021-09-05: qty 1

## 2021-09-05 MED ORDER — OXYCODONE-ACETAMINOPHEN 5-325 MG PO TABS
1.0000 | ORAL_TABLET | Freq: Once | ORAL | Status: AC
Start: 2021-09-05 — End: 2021-09-05
  Administered 2021-09-05: 1 via ORAL
  Filled 2021-09-05: qty 1

## 2021-09-05 MED ORDER — HYDROMORPHONE HCL 1 MG/ML IJ SOLN
1.0000 mg | Freq: Once | INTRAMUSCULAR | Status: AC
Start: 2021-09-05 — End: 2021-09-05
  Administered 2021-09-05: 1 mg via INTRAVENOUS
  Filled 2021-09-05: qty 1

## 2021-09-05 MED ORDER — METHOCARBAMOL 500 MG PO TABS
500.0000 mg | ORAL_TABLET | Freq: Once | ORAL | Status: AC
  Administered 2021-09-05: 500 mg via ORAL
  Filled 2021-09-05: qty 1

## 2021-09-05 MED ORDER — HYDROMORPHONE HCL 1 MG/ML IJ SOLN
0.5000 mg | Freq: Once | INTRAMUSCULAR | Status: AC
Start: 2021-09-05 — End: 2021-09-05
  Administered 2021-09-05: 0.5 mg via INTRAVENOUS
  Filled 2021-09-05: qty 1

## 2021-09-05 MED ORDER — DEXAMETHASONE SODIUM PHOSPHATE 10 MG/ML IJ SOLN
10.0000 mg | Freq: Once | INTRAMUSCULAR | Status: AC
  Administered 2021-09-05: 10 mg via INTRAVENOUS
  Filled 2021-09-05: qty 1

## 2021-09-05 NOTE — ED Notes (Signed)
Attempted to call report to [facility]. Was on phone for 3 minutes, went through phone tree, and still no one picked up.

## 2021-09-05 NOTE — ED Notes (Signed)
ED Provider at bedside. 

## 2021-09-05 NOTE — Discharge Instructions (Addendum)
Follow-up with your primary care doctor and your spine specialist.  Return to ER if you develop uncontrolled pain, numbness, weakness, bladder or bowel incontinence, fevers or other new concerning symptom.

## 2021-09-05 NOTE — ED Notes (Signed)
[facility], [phone] to give report for patients return. On hold for 5 minutes, no one picked up. Several attempts made by Rarchel RN with no success. Otila Kluver, Med tech.informed of patients return and meds administerd [facility] will transport.

## 2021-09-05 NOTE — ED Triage Notes (Signed)
Pt in from [facility] via [place] with c/o L leg pain x 3 days, also having low back pain that began this am. States the pain stretches from back down to LLE. Given Hydrocodone at 0500 by staff PTA. Staff also reports he just started Bactrim yesterday for LLE foot infection

## 2021-09-05 NOTE — ED Notes (Signed)
Assisted patient with urinal to urinate

## 2021-09-05 NOTE — ED Provider Notes (Signed)
[facility] Provider Note   CSN: [id number] Arrival date & time: 09/05/21  1660     History Chief Complaint  Patient presents with   Back Pain   Leg Pain    Jorge Sherman is a 80 y.o. male.  Presents to ER with concern for back pain, leg pain.  Patient reports pain ongoing for the past few days.  Has suffered from chronic back pain for many years and has undergone multiple back surgeries.  States it has been a few years since he had any surgery.  Denies any numbness, weakness, bladder or bowel incontinence.  No fevers.  Low back and radiates to left leg.  Pain is up to 10 out of 10 in severity sharp and stabbing.  Reports yesterday his facility started him on Bactrim for possible skin infection in his left leg.  Has received 1 dose. Denies any recent trauma.  HPI     Past Medical History:  Diagnosis Date   Chronic pain    Depressive disorder, not elsewhere classified    Dizziness and giddiness    Essential and other specified forms of tremor    Headache(784.0)    Leukocytosis, unspecified    Obstructive sleep apnea (adult) (pediatric)    Pain in joint, lower leg    Septic joint of left knee joint ([place]) 04/22/2019   Spinal stenosis, lumbar region, without neurogenic claudication    Thoracic or lumbosacral neuritis or radiculitis, unspecified    Type II or unspecified type diabetes mellitus without mention of complication, not stated as uncontrolled    Unspecified cardiovascular disease    Unspecified essential hypertension     Patient Active Problem List   Diagnosis Date Noted   Acute respiratory failure with hypercapnia ([place]) 02/14/2021   Bilious emesis 02/14/2021   Acute hypercapnic respiratory failure ([place]) 02/14/2021   Hyponatremia 11/11/2020   Falls 10/30/2020   Dehydration with hyponatremia 10/29/2020   Fall at home, initial encounter 10/29/2020   Musculoskeletal chest pain 10/29/2020   Mixed hyperlipidemia due to type 2 diabetes  mellitus ([place]) 10/29/2020   Benign prostatic hyperplasia without lower urinary tract symptoms 10/29/2020   Ileus ([place])    Declining functional status    Goals of care, counseling/discussion    Palliative care by specialist    AKI (acute kidney injury) ([place]) 06/27/2020   Acute diarrhea 06/27/2020   Altered mental status 08/28/2019   Septic joint of left knee joint ([place]) 04/22/2019   Erectile dysfunction 10/30/2013   Essential hypertension, benign 10/30/2013   Coronary artery disease involving native coronary artery of native heart without angina pectoris 10/30/2013   Dizziness and giddiness    Pain in joint, lower leg    Type 2 diabetes mellitus without complication, with long-term current use of insulin (HCC)    Obstructive sleep apnea    Depressive disorder, not elsewhere classified    Essential and other specified forms of tremor    Headache(784.0)    Spinal stenosis, lumbar region, without neurogenic claudication    Thoracic or lumbosacral neuritis or radiculitis, unspecified    Chronic pain syndrome     Past Surgical History:  Procedure Laterality Date   BACK SURGERY     back surgey     times 3   IR INJECT/THERA/INC NEEDLE/CATH/PLC EPI/LUMB/SAC W/IMG  11/26/2020       Family History  Problem Relation Age of Onset   Cancer Father    Heart disease Father    Stroke Mother  Diabetes Mother     Social History   Tobacco Use   Smoking status: Former   Smokeless tobacco: Never  Scientific laboratory technician Use: Never used  Substance Use Topics   Alcohol use: No   Drug use: No    Home Medications Prior to Admission medications   Medication Sig Start Date End Date Taking? Authorizing Provider  acetaminophen (TYLENOL) 500 MG tablet Take 2 tablets (1,000 mg total) by mouth every 6 (six) hours as needed for headache, mild pain or fever. 11/28/20  Yes Dwyane Dee, MD  amLODipine (NORVASC) 10 MG tablet Take 1 tablet (10 mg total) by mouth daily. 11/29/20  Yes Dwyane Dee, MD   atorvastatin (LIPITOR) 80 MG tablet Take 80 mg by mouth daily. 12/14/20  Yes [provider]  celecoxib (CELEBREX) 200 MG capsule Take 200 mg by mouth daily.   Yes [provider]  cloNIDine (CATAPRES) 0.1 MG tablet Take 0.1 mg by mouth 2 (two) times daily.    Yes [provider]  diclofenac Sodium (VOLTAREN) 1 % GEL Apply 2 g topically 4 (four) times daily. 04/29/21  Yes Couture, Cortni S, PA-C  DULoxetine (CYMBALTA) 30 MG capsule Take 1 capsule (30 mg total) by mouth 2 (two) times daily. 02/24/21  Yes Aline August, MD  ferrous sulfate 325 (65 FE) MG tablet Take 325 mg by mouth daily.    Yes [provider]  folic acid (FOLVITE) 1 MG tablet Take 1 tablet (1 mg total) by mouth daily. 02/25/21  Yes Aline August, MD  gabapentin (NEURONTIN) 300 MG capsule Take 1 capsule (300 mg total) by mouth 3 (three) times daily. Patient taking differently: Take 600 mg by mouth 3 (three) times daily. (Take with 100mg  to equal a total dose of 700mg .) 11/28/20  Yes Dwyane Dee, MD  hydroxychloroquine (PLAQUENIL) 200 MG tablet Take 200 mg by mouth 2 (two) times daily.   Yes [provider]  linaclotide (LINZESS) 72 MCG capsule Take 72 mcg by mouth daily as needed (constipation).   Yes [provider]  loperamide (IMODIUM A-D) 2 MG tablet Take 1 tablet (2 mg total) by mouth 4 (four) times daily as needed for diarrhea or loose stools. 07/04/20  Yes Georgette Shell, MD  LORazepam (ATIVAN) 1 MG tablet Take 0.5 tablets (0.5 mg total) by mouth every 8 (eight) hours as needed for anxiety. 02/24/21  Yes Aline August, MD  methocarbamol (ROBAXIN) 500 MG tablet Take 1 tablet (500 mg total) by mouth 2 (two) times daily as needed for muscle spasms (hold for sedation). 02/24/21  Yes Aline August, MD  methocarbamol (ROBAXIN) 500 MG tablet Take 1 tablet (500 mg total) by mouth 2 (two) times daily. 05/04/21  Yes Lacretia Leigh, MD  Multiple Vitamin (MULTIVITAMIN WITH MINERALS) TABS  tablet Take 1 tablet by mouth daily. 11/29/20  Yes Dwyane Dee, MD  oxyCODONE-acetaminophen (PERCOCET/ROXICET) 5-325 MG tablet Take 1 tablet by mouth every 6 (six) hours as needed for severe pain. 05/10/21  Yes Hayden Rasmussen, MD  pantoprazole (PROTONIX) 40 MG tablet Take 1 tablet (40 mg total) by mouth daily. 11/29/20  Yes Dwyane Dee, MD  predniSONE (DELTASONE) 20 MG tablet Take 2 tablets (40 mg total) by mouth daily with breakfast. For the next four days 06/03/21  Yes Carmin Muskrat, MD  rosuvastatin (CRESTOR) 40 MG tablet Take 40 mg by mouth every evening.    Yes [provider]  tamsulosin (FLOMAX) 0.4 MG CAPS capsule Take 0.4 mg by mouth  daily.   Yes [provider]  thiamine 100 MG tablet Take 1 tablet (100 mg total) by mouth daily. 02/25/21  Yes Aline August, MD  albuterol (VENTOLIN HFA) 108 (90 Base) MCG/ACT inhaler Inhale 2 puffs into the lungs every 4 (four) hours as needed for wheezing or shortness of breath. 11/03/20   Pokhrel, [place], MD  lidocaine (LIDODERM) 5 % Place 1 patch onto the skin daily. Remove & Discard patch within 12 hours or as directed by MD. Right chest Patient taking differently: Place 1 patch onto the skin daily. Remove & Discard patch within 12 hours or as directed by MD. Apply one patch to each knee. 11/03/20   Pokhrel, Corrie Mckusick, MD  Menthol, Topical Analgesic, (BIOFREEZE EX) Apply 1 application topically daily. To back of neck and lower back.    [provider]  ondansetron (ZOFRAN) 4 MG tablet Take 1 tablet (4 mg total) by mouth every 6 (six) hours as needed for nausea. 07/04/20   Georgette Shell, MD  polyethylene glycol (MIRALAX / GLYCOLAX) 17 g packet Take 17 g by mouth daily as needed for mild constipation. 11/03/20   Pokhrel, Corrie Mckusick, MD  SUMAtriptan (IMITREX) 25 MG tablet Take 25 mg by mouth daily as needed for migraine. May repeat in 2 hours if headache persists or recurs.    [provider]    Allergies    Cefepime,  Iohexol, Fentanyl, and Iodine  Review of Systems   Review of Systems  Constitutional:  Negative for chills and fever.  HENT:  Negative for ear pain and sore throat.   Eyes:  Negative for pain and visual disturbance.  Respiratory:  Negative for cough and shortness of breath.   Cardiovascular:  Negative for chest pain and palpitations.  Gastrointestinal:  Negative for abdominal pain and vomiting.  Genitourinary:  Negative for dysuria and hematuria.  Musculoskeletal:  Positive for arthralgias and back pain.  Skin:  Positive for color change and rash.  Neurological:  Negative for seizures and syncope.  All other systems reviewed and are negative.  Physical Exam Updated Vital Signs BP (!) 116/59   Pulse 79   Temp 98.3 F (36.8 C) (Oral)   Resp 16   Wt 109.8 kg   SpO2 92%   BMI 34.72 kg/m   Physical Exam Vitals and nursing note reviewed.  Constitutional:      Appearance: He is well-developed.  HENT:     Head: Normocephalic and atraumatic.  Eyes:     Conjunctiva/sclera: Conjunctivae normal.  Cardiovascular:     Rate and Rhythm: Normal rate and regular rhythm.     Heart sounds: No murmur heard. Pulmonary:     Effort: Pulmonary effort is normal. No respiratory distress.     Breath sounds: Normal breath sounds.  Abdominal:     Palpations: Abdomen is soft.     Tenderness: There is no abdominal tenderness.  Musculoskeletal:     Cervical back: Neck supple.     Comments: Some generalized TTP across lumbar region, no step off or deformity; normal joint ROM throughout LLE and RLE  Skin:    General: Skin is warm and dry.     Comments: There is slight/faint erythema over the anterior left lower leg  Neurological:     Mental Status: He is alert.     Comments: Normal sensation to light touch in LLE and RLE 5/5 strength in LLE and RLE    ED Results / Procedures / Treatments   Labs (all labs ordered  are listed, but only abnormal results are displayed) Labs Reviewed - No data to  display  EKG None  Radiology No results found.  Procedures Procedures   Medications Ordered in ED Medications  HYDROmorphone (DILAUDID) injection 1 mg (1 mg Intravenous Given 09/05/21 0729)  ketorolac (TORADOL) 15 MG/ML injection 15 mg (15 mg Intravenous Given 09/05/21 0818)  methocarbamol (ROBAXIN) tablet 500 mg (500 mg Oral Given 09/05/21 0819)  dexamethasone (DECADRON) injection 10 mg (10 mg Intravenous Given 09/05/21 0902)  HYDROmorphone (DILAUDID) injection 0.5 mg (0.5 mg Intravenous Given 09/05/21 0902)    ED Course  I have reviewed the triage vital signs and the nursing notes.  Pertinent labs & imaging results that were available during my care of the patient were reviewed by me and considered in my medical decision making (see chart for details).    MDM Rules/Calculators/A&P                           80 year old male presents to ER with concern for low back pain rating down left leg.  Has history of prior back pain.  He is neurovascularly intact.  Good strength and sensation in lower extremities.  Suspect sciatica.  No red flag symptoms.  Pain well controlled at present.  Offered course of steroids however patient states that he does not want to start any new oral medications but was okay with receiving one-time dose of Decadron.  Recommend follow-up with primary doctor and his past spine surgeon.  Patient also reports recently being started on Bactrim for leg cellulitis.  He does have slight erythema over his anterior lower leg.  Given the very mild nature, believe trial of oral antibiotics is very reasonable at this time.  Has only received 1 dose per patient.  Discharged back to facility.    After the discussed management above, the patient was determined to be safe for discharge.  The patient was in agreement with this plan and all questions regarding their care were answered.  ED return precautions were discussed and the patient will return to the ED with any  significant worsening of condition.  Final Clinical Impression(s) / ED Diagnoses Final diagnoses:  Sciatica of left side    Rx / DC Orders ED Discharge Orders     None        Lucrezia Starch, MD 09/05/21 1014

## 2021-09-05 NOTE — ED Notes (Signed)
Assisted to transfer patient to gurney for [facility] with NT Cristie Hem, and EMT Marya Amsler.

## 2021-09-23 ENCOUNTER — Emergency Department (HOSPITAL_BASED_OUTPATIENT_CLINIC_OR_DEPARTMENT_OTHER): Payer: Medicare HMO

## 2021-09-23 ENCOUNTER — Encounter (HOSPITAL_BASED_OUTPATIENT_CLINIC_OR_DEPARTMENT_OTHER): Payer: Self-pay

## 2021-09-23 ENCOUNTER — Emergency Department (HOSPITAL_BASED_OUTPATIENT_CLINIC_OR_DEPARTMENT_OTHER)
Admission: EM | Admit: 2021-09-23 | Discharge: 2021-09-23 | Disposition: A | Discharge status: Home / Self Care | Payer: Medicare HMO | Attending: Emergency Medicine | Admitting: Emergency Medicine

## 2021-09-23 DIAGNOSIS — S91115A Laceration without foreign body of left lesser toe(s) without damage to nail, initial encounter: Secondary | ICD-10-CM | POA: Diagnosis not present

## 2021-09-23 DIAGNOSIS — E119 Type 2 diabetes mellitus without complications: Secondary | ICD-10-CM | POA: Insufficient documentation

## 2021-09-23 DIAGNOSIS — X501XXA Overexertion from prolonged static or awkward postures, initial encounter: Secondary | ICD-10-CM | POA: Insufficient documentation

## 2021-09-23 DIAGNOSIS — Z79899 Other long term (current) drug therapy: Secondary | ICD-10-CM | POA: Diagnosis not present

## 2021-09-23 DIAGNOSIS — M5442 Lumbago with sciatica, left side: Secondary | ICD-10-CM | POA: Diagnosis not present

## 2021-09-23 DIAGNOSIS — I2510 Atherosclerotic heart disease of native coronary artery without angina pectoris: Secondary | ICD-10-CM | POA: Insufficient documentation

## 2021-09-23 DIAGNOSIS — I10 Essential (primary) hypertension: Secondary | ICD-10-CM | POA: Diagnosis not present

## 2021-09-23 DIAGNOSIS — S99922A Unspecified injury of left foot, initial encounter: Secondary | ICD-10-CM | POA: Diagnosis present

## 2021-09-23 DIAGNOSIS — Z87891 Personal history of nicotine dependence: Secondary | ICD-10-CM | POA: Diagnosis not present

## 2021-09-23 DIAGNOSIS — Z23 Encounter for immunization: Secondary | ICD-10-CM | POA: Insufficient documentation

## 2021-09-23 MED ORDER — TETANUS-DIPHTH-ACELL PERTUSSIS 5-2.5-18.5 LF-MCG/0.5 IM SUSY
0.5000 mL | PREFILLED_SYRINGE | Freq: Once | INTRAMUSCULAR | Status: AC
  Administered 2021-09-23: 0.5 mL via INTRAMUSCULAR
  Filled 2021-09-23: qty 0.5

## 2021-09-23 MED ORDER — KETOROLAC TROMETHAMINE 15 MG/ML IJ SOLN
15.0000 mg | Freq: Once | INTRAMUSCULAR | Status: DC
  Filled 2021-09-23: qty 1

## 2021-09-23 MED ORDER — LIDOCAINE 5 % EX PTCH
1.0000 | MEDICATED_PATCH | CUTANEOUS | 0 refills | Status: DC
Start: 2021-09-23 — End: 2024-08-09

## 2021-09-23 MED ORDER — KETOROLAC TROMETHAMINE 15 MG/ML IJ SOLN: 15.0000 mg | Freq: Once | INTRAMUSCULAR | Status: DC

## 2021-09-23 MED ORDER — GABAPENTIN 300 MG PO CAPS
300.0000 mg | ORAL_CAPSULE | Freq: Once | ORAL | Status: AC
  Administered 2021-09-23: 300 mg via ORAL
  Filled 2021-09-23: qty 1

## 2021-09-23 MED ORDER — KETOROLAC TROMETHAMINE 60 MG/2ML IM SOLN
60.0000 mg | Freq: Once | INTRAMUSCULAR | Status: AC
  Administered 2021-09-23: 60 mg via INTRAMUSCULAR
  Filled 2021-09-23: qty 2

## 2021-09-23 MED ORDER — MORPHINE SULFATE (PF) 4 MG/ML IV SOLN
4.0000 mg | Freq: Once | INTRAVENOUS | Status: AC
  Administered 2021-09-23: 4 mg via INTRAMUSCULAR
  Filled 2021-09-23: qty 1

## 2021-09-23 MED ORDER — METHOCARBAMOL 500 MG PO TABS
500.0000 mg | ORAL_TABLET | Freq: Once | ORAL | Status: AC
  Administered 2021-09-23: 500 mg via ORAL
  Filled 2021-09-23: qty 1

## 2021-09-23 MED ORDER — ACETAMINOPHEN 500 MG PO TABS
1000.0000 mg | ORAL_TABLET | Freq: Once | ORAL | Status: AC
  Administered 2021-09-23: 1000 mg via ORAL
  Filled 2021-09-23: qty 2

## 2021-09-23 NOTE — ED Notes (Signed)
Pt requesting additional pain medication.  Pt educated that he is up for d/c and we will not be giving him anything further. Pt upset.

## 2021-09-23 NOTE — ED Notes (Signed)
PA made aware of Pt's request and spoke to Pt.

## 2021-09-23 NOTE — ED Notes (Signed)
Pt had to be woken up to clean and dress L foot lac.  Pt continues to state pain is 10/10, so Toradol was given.

## 2021-09-23 NOTE — ED Notes (Signed)
ED Provider at bedside. 

## 2021-09-23 NOTE — ED Notes (Signed)
PT[facility] contacted by NS.

## 2021-09-23 NOTE — ED Triage Notes (Signed)
Per EMS, Pt, from [facility], c/o L lower back pain starting this morning and laceration between 2nd and 3rd toes on L foot since last night.  Pain score 10/10.  Pt sts "I think, I twisted my back getting out of bed this morning."  Hx of sciatica.  Pt cut foot on dresser last night. Dressing applied.   Pt was given home medications and hydrocodone prior to transport.

## 2021-09-23 NOTE — ED Notes (Signed)
Pt continues to ask for more pain medication.  Educated that he has been discharged and nothing more will be provided.  Pt provided w/ ice pack per request.  Pt attempting to call a friend to pick him up d/t delay w/ PTAR.

## 2021-09-23 NOTE — Discharge Instructions (Addendum)
You may apply ice or heat to affected area for up to 15 minutes at a time.  You will receive a prescription for a Lidoderm patch.  Remove and discard patch after 12 hours of use on one area.  Return to the Emergency Department if you are experiencing worsening or persistent back pain, loss of bowel or bladder function, or signs of infection.  Ensure to keep left foot dry, clean, and bandaged.  Return to the emergency department if you are noticing signs of infection.

## 2021-09-23 NOTE — ED Provider Notes (Signed)
[facility] EMERGENCY DEPARTMENT Provider Note   CSN: [id number] Arrival date & time: 09/23/21  1158     History Chief Complaint  Patient presents with   Back Pain   Extremity Laceration    Jorge Sherman is a 80 y.o. male with a past medical history of sciatica and type 2 diabetes presents to the emergency department brought in by EMS complaining of left lower back pain onset this morning. Patient reports that he twisted his back getting out of bed this morning.  His left lower back pain radiates down to his left foot. Denies any trauma to the back. Patient was given his home medications as well as hydrocodone prior to arrival. Patient denies bowel/bladder incontinence, fever, chills, chest pain, shortness of breath, abdominal pain, nausea, vomiting, diarrhea, hematuria, dysuria, numbness, tingling, or weakness.  Denies history of cancer or IV drug use.   Patient also complains of left toe laceration onset last night.  He was in his power wheelchair when it jerked forward causing him to strike his left foot on the dresser.  The area was bandaged and cleaned by staff at his living facility last night.  He was not seen for evaluation of the laceration prior to today. He denies numbness, tingling, color change, or drainage.   The history is provided by the patient. No language interpreter was used.      Past Medical History:  Diagnosis Date   Chronic pain    Depressive disorder, not elsewhere classified    Dizziness and giddiness    Essential and other specified forms of tremor    Headache(784.0)    Leukocytosis, unspecified    Obstructive sleep apnea (adult) (pediatric)    Pain in joint, lower leg    Septic joint of left knee joint ([place]) 04/22/2019   Spinal stenosis, lumbar region, without neurogenic claudication    Thoracic or lumbosacral neuritis or radiculitis, unspecified    Type II or unspecified type diabetes mellitus without mention of complication, not stated as  uncontrolled    Unspecified cardiovascular disease    Unspecified essential hypertension     Patient Active Problem List   Diagnosis Date Noted   Acute respiratory failure with hypercapnia ([place]) 02/14/2021   Bilious emesis 02/14/2021   Acute hypercapnic respiratory failure ([place]) 02/14/2021   Hyponatremia 11/11/2020   Falls 10/30/2020   Dehydration with hyponatremia 10/29/2020   Fall at home, initial encounter 10/29/2020   Musculoskeletal chest pain 10/29/2020   Mixed hyperlipidemia due to type 2 diabetes mellitus ([place]) 10/29/2020   Benign prostatic hyperplasia without lower urinary tract symptoms 10/29/2020   Ileus ([place])    Declining functional status    Goals of care, counseling/discussion    Palliative care by specialist    AKI (acute kidney injury) ([place]) 06/27/2020   Acute diarrhea 06/27/2020   Altered mental status 08/28/2019   Septic joint of left knee joint ([place]) 04/22/2019   Erectile dysfunction 10/30/2013   Essential hypertension, benign 10/30/2013   Coronary artery disease involving native coronary artery of native heart without angina pectoris 10/30/2013   Dizziness and giddiness    Pain in joint, lower leg    Type 2 diabetes mellitus without complication, with long-term current use of insulin (HCC)    Obstructive sleep apnea    Depressive disorder, not elsewhere classified    Essential and other specified forms of tremor    Headache(784.0)    Spinal stenosis, lumbar region, without neurogenic claudication    Thoracic or lumbosacral  neuritis or radiculitis, unspecified    Chronic pain syndrome     Past Surgical History:  Procedure Laterality Date   BACK SURGERY     back surgey     times 3   IR INJECT/THERA/INC NEEDLE/CATH/PLC EPI/LUMB/SAC W/IMG  11/26/2020       Family History  Problem Relation Age of Onset   Cancer Father    Heart disease Father    Stroke Mother    Diabetes Mother     Social History   Tobacco Use   Smoking status: Former    Smokeless tobacco: Never  Scientific laboratory technician Use: Never used  Substance Use Topics   Alcohol use: No   Drug use: No    Home Medications Prior to Admission medications   Medication Sig Start Date End Date Taking? Authorizing Provider  lidocaine (LIDODERM) 5 % Place 1 patch onto the skin daily. Remove & Discard patch within 12 hours or as directed by MD 09/23/21  Yes Jacqlyn Larsen, PA-C  acetaminophen (TYLENOL) 500 MG tablet Take 2 tablets (1,000 mg total) by mouth every 6 (six) hours as needed for headache, mild pain or fever. 11/28/20   Dwyane Dee, MD  albuterol (VENTOLIN HFA) 108 (90 Base) MCG/ACT inhaler Inhale 2 puffs into the lungs every 4 (four) hours as needed for wheezing or shortness of breath. 11/03/20   Pokhrel, Corrie Mckusick, MD  amLODipine (NORVASC) 10 MG tablet Take 1 tablet (10 mg total) by mouth daily. 11/29/20   Dwyane Dee, MD  atorvastatin (LIPITOR) 80 MG tablet Take 80 mg by mouth daily. 12/14/20   [provider]  celecoxib (CELEBREX) 200 MG capsule Take 200 mg by mouth daily.    [provider]  cloNIDine (CATAPRES) 0.1 MG tablet Take 0.1 mg by mouth 2 (two) times daily.     [provider]  diclofenac Sodium (VOLTAREN) 1 % GEL Apply 2 g topically 4 (four) times daily. 04/29/21   Couture, Cortni S, PA-C  DULoxetine (CYMBALTA) 30 MG capsule Take 1 capsule (30 mg total) by mouth 2 (two) times daily. 02/24/21   Aline August, MD  ferrous sulfate 325 (65 FE) MG tablet Take 325 mg by mouth daily.     [provider]  folic acid (FOLVITE) 1 MG tablet Take 1 tablet (1 mg total) by mouth daily. 02/25/21   Aline August, MD  gabapentin (NEURONTIN) 300 MG capsule Take 1 capsule (300 mg total) by mouth 3 (three) times daily. Patient taking differently: Take 600 mg by mouth 3 (three) times daily. (Take with 100mg  to equal a total dose of 700mg .) 11/28/20   Dwyane Dee, MD  hydroxychloroquine (PLAQUENIL) 200 MG tablet Take 200 mg by mouth 2 (two) times  daily.    [provider]  linaclotide (LINZESS) 72 MCG capsule Take 72 mcg by mouth daily as needed (constipation).    [provider]  loperamide (IMODIUM A-D) 2 MG tablet Take 1 tablet (2 mg total) by mouth 4 (four) times daily as needed for diarrhea or loose stools. 07/04/20   Georgette Shell, MD  LORazepam (ATIVAN) 1 MG tablet Take 0.5 tablets (0.5 mg total) by mouth every 8 (eight) hours as needed for anxiety. 02/24/21   Aline August, MD  Menthol, Topical Analgesic, (BIOFREEZE EX) Apply 1 application topically daily. To back of neck and lower back.    [provider]  methocarbamol (ROBAXIN) 500 MG tablet Take 1 tablet (500 mg total) by mouth 2 (two) times daily  as needed for muscle spasms (hold for sedation). 02/24/21   Aline August, MD  methocarbamol (ROBAXIN) 500 MG tablet Take 1 tablet (500 mg total) by mouth 2 (two) times daily. 05/04/21   Lacretia Leigh, MD  Multiple Vitamin (MULTIVITAMIN WITH MINERALS) TABS tablet Take 1 tablet by mouth daily. 11/29/20   Dwyane Dee, MD  ondansetron (ZOFRAN) 4 MG tablet Take 1 tablet (4 mg total) by mouth every 6 (six) hours as needed for nausea. 07/04/20   Georgette Shell, MD  oxyCODONE-acetaminophen (PERCOCET/ROXICET) 5-325 MG tablet Take 1 tablet by mouth every 6 (six) hours as needed for severe pain. 05/10/21   Hayden Rasmussen, MD  pantoprazole (PROTONIX) 40 MG tablet Take 1 tablet (40 mg total) by mouth daily. 11/29/20   Dwyane Dee, MD  polyethylene glycol (MIRALAX / GLYCOLAX) 17 g packet Take 17 g by mouth daily as needed for mild constipation. 11/03/20   Pokhrel, Corrie Mckusick, MD  predniSONE (DELTASONE) 20 MG tablet Take 2 tablets (40 mg total) by mouth daily with breakfast. For the next four days 06/03/21   Carmin Muskrat, MD  rosuvastatin (CRESTOR) 40 MG tablet Take 40 mg by mouth every evening.     [provider]  SUMAtriptan (IMITREX) 25 MG tablet Take 25 mg by mouth daily as needed for migraine. May  repeat in 2 hours if headache persists or recurs.    [provider]  tamsulosin (FLOMAX) 0.4 MG CAPS capsule Take 0.4 mg by mouth daily.    [provider]  thiamine 100 MG tablet Take 1 tablet (100 mg total) by mouth daily. 02/25/21   Aline August, MD    Allergies    Cefepime, Iohexol, Fentanyl, and Iodine  Review of Systems   Review of Systems  Constitutional:  Negative for chills and fever.  Respiratory:  Negative for shortness of breath.   Cardiovascular:  Negative for chest pain.  Gastrointestinal:  Negative for abdominal pain, nausea and vomiting.       No bowel incontinence  Genitourinary:  Negative for dysuria and hematuria.       No bladder incontinence  Musculoskeletal:  Positive for back pain.  Skin:  Negative for color change, rash and wound.  Neurological:  Negative for weakness and numbness.       -Tingling   Physical Exam Updated Vital Signs BP 107/78 (BP Location: Right Arm)   Pulse 88   Temp 98 F (36.7 C) (Oral)   Resp 18   SpO2 98%   Physical Exam Vitals and nursing note reviewed.  Constitutional:      General: He is not in acute distress.    Appearance: He is well-developed. He is not diaphoretic.  HENT:     Head: Normocephalic and atraumatic.  Eyes:     General: No scleral icterus.    Extraocular Movements: Extraocular movements intact.  Cardiovascular:     Rate and Rhythm: Normal rate and regular rhythm.     Pulses: Normal pulses.     Heart sounds: Normal heart sounds.  Pulmonary:     Effort: Pulmonary effort is normal. No respiratory distress.     Breath sounds: Normal breath sounds.  Abdominal:     Palpations: Abdomen is soft. There is no mass.     Tenderness: There is no abdominal tenderness.  Musculoskeletal:        General: Tenderness present. Normal range of motion.     Cervical back: Normal, normal range of motion and neck supple. No edema, deformity, signs  of trauma, tenderness or bony tenderness.     Thoracic  back: Normal. No edema, deformity, signs of trauma, tenderness or bony tenderness.     Lumbar back: Tenderness present. No swelling, edema, deformity, signs of trauma, lacerations or bony tenderness. Negative right straight leg raise test and negative left straight leg raise test.     Comments: No C, T, or L spine tenderness to palpation.  Minimal tenderness to palpation to left lumbar region.  No obvious signs of trauma, deformity, infection, step-offs. Lung expansion normal.  Strength and sensation intact bilaterally to upper and lower extremities.  Negative straight leg raise bilaterally. Patient power wheelchair bound, unable to ambulate patient at baseline.  Skin:    General: Skin is warm and dry.  Neurological:     Mental Status: He is alert and oriented to person, place, and time.  Psychiatric:        Behavior: Behavior normal.    ED Results / Procedures / Treatments   Labs (all labs ordered are listed, but only abnormal results are displayed) Labs Reviewed - No data to display  EKG None  Radiology DG Foot Complete Left  Result Date: 09/23/2021 CLINICAL DATA:  Left foot laceration between the second and third toes. EXAM: LEFT FOOT - COMPLETE 3+ VIEW COMPARISON:  None. FINDINGS: No acute fracture or dislocation. Mild degenerative changes of the first MTP joint. Soft tissues are unremarkable. No radiopaque foreign body identified. IMPRESSION: 1. No acute osseous abnormality. Electronically Signed   By: Titus Dubin M.D.   On: 09/23/2021 14:04    Procedures Procedures   Medications Ordered in ED Medications  Tdap (BOOSTRIX) injection 0.5 mL (0.5 mLs Intramuscular Given 09/23/21 1309)  morphine 4 MG/ML injection 4 mg (4 mg Intramuscular Given 09/23/21 1352)  ketorolac (TORADOL) injection 60 mg (60 mg Intramuscular Given 09/23/21 1422)  acetaminophen (TYLENOL) tablet 1,000 mg (1,000 mg Oral Given 09/23/21 1814)  methocarbamol (ROBAXIN) tablet 500 mg (500 mg Oral Given 09/23/21 1814)   gabapentin (NEURONTIN) capsule 300 mg (300 mg Oral Given 09/23/21 1814)    ED Course  I have reviewed the triage vital signs and the nursing notes.  Pertinent labs & imaging results that were available during my care of the patient were reviewed by me and considered in my medical decision making (see chart for details).  1:00 PM - Patient discussed with attending, Dr. Sherry Ruffing. Dr. Sherry Ruffing agrees to evaluate patient.    1:17 PM - Attending, Dr. Sherry Ruffing evaluated patient and discussed a treatment plan of morphine and Toradol with patient. Dr. Sherry Ruffing thoroughly discussed with patient regarding use of Toradol prior to obtaining kidney function.  Patient of sound mind and acknowledges risks of receiving toradol injection.  Patient would like to proceed with Toradol injection at this time.  Per my attending instructed to proceed with ordering 4 mg morphine and 15 mg Toradol for patient.  1:56 PM - RN informed that patient was declining 15 mg Toradol injection. RN was informed by patient that he typically receives 60 mg Toradol injection.   2:00 PM Patient reevaluated and again discussed with patient the reasoning on utilizing 15 mg of Toradol injection.  Patient again voices that 60 mg of Toradol works best for him. Again thoroughly discussed with patient regarding checking kidney function prior to receiving Toradol injection.  Patient again declines checking kidney function at this time.  Pt states several times, "My kidneys are fine, I need pain medication. I don't want my kidneys checked, just pain  medication." Patient made aware of the risks that come with receiving Toradol injection without prior kidney function being checked.  Patient acknowledges and voices understanding.  2:02 PM - Discussed update to increase Toradol from 15 mg to 60 mg with attending, Dr. Sherry Ruffing.  Dr. Sherry Ruffing agrees with plan.  2:30 PM -patient reevaluated.  Patient pain improved with Toradol.   2:47 PM - Patient placed  for discharge to facility at this time due to improvement of back pain. Patient agreeable with plan.  5:36 PM -patient's nurse informed me that patient is requesting more pain medication.  Discussed with patient on use of narcotics in the emergency department.  Informed patient that no more narcotics will be given during this encounter due to soft blood pressures. Offered patient Tylenol or ice pack to aid with pain while awaiting transfer back to facility.  Patient persistent and continuing to ask for more narcotics.  Patient requesting to speak with another provider.   5:48 PM - Patient evaluated by Benedetto Goad, PA-C.  Following discussion with Merleen Nicely, pt agreed to move forward with one time dose of Robaxin and tylenol while in the ED awaiting PTAR for transport to facility. Merleen Nicely also discussed with patient on Rx lidoderm patch, which patient is agreeable with. Pt also given his at home gabapentin dose while in ED, ordered by Benedetto Goad, PA-C. Patient agreeable with plan.    MDM Rules/Calculators/A&P                          Patient with left lumbar back pain with radiation down posterior left leg. Pt with history of sciatica. No neurological deficits and normal neuro exam.  Patient is wheelchair bound at baseline.  No loss of bowel or bladder control.  No concern for cauda equina.  No fever, night sweats, weight loss, h/o cancer, IVDA, no recent procedure to back. No urinary symptoms suggestive of UTI.  No concerning findings on exams. No imaging indicated at this time due to absence of red flag symptoms. Patient care discussed with Attending and following evaluation, attending noted to proceed with Toradol and morphine in the ED. Thorough discussion given several times to patient by Attending and myself regarding checking kidney function prior to Toradol injection. Patient adamant and refused kidney function evaluation several times. Further discussion within ED course. Patient back pain improved  with regimen. Patient evaluated several times by several Clinicians, each time asking for more narcotics. After many thorough discussions, patient agreeable to tylenol, ice, gabapentin, and Rx Lidoderm patch. Pt given Rx Lidoderm patch and informed to continue with home medications as directed. Supportive care and return precaution discussed. Appears safe for discharge at this time. Follow up as indicated in discharge paperwork.   Tetanus updated in ED. Laceration occurred > 12 hours prior to arrival to the emergency department.  Due to patient laceration being greater than 12 hours laceration not repaired today in the emergency department.  Left foot x-ray obtained and showed no fracture, dislocation, or foreign bodies. Laceration cleaned with new dressing applied.  Discussed laceration care with pt and answered questions. Pt to follow-up in the emergency department if of infection, fever, chills.  Patient is hemodynamically stable with no complaints prior to discharge.   Final Clinical Impression(s) / ED Diagnoses Final diagnoses:  Acute left-sided low back pain with left-sided sciatica  Laceration of lesser toe of left foot without foreign body present or damage to nail, initial encounter    Rx /  DC Orders ED Discharge Orders          Ordered    lidocaine (LIDODERM) 5 %  Every 24 hours        09/23/21 1458             Liliahna Cudd A, PA 09/24/21 0121    Tegeler, Gwenyth Allegra, MD 09/24/21 [phone]

## 2021-09-23 NOTE — ED Notes (Signed)
Pt reported to staff that he now has someone willing to pick him up.

## 2021-09-26 ENCOUNTER — Emergency Department (HOSPITAL_BASED_OUTPATIENT_CLINIC_OR_DEPARTMENT_OTHER): Payer: Medicare HMO

## 2021-09-26 ENCOUNTER — Other Ambulatory Visit: Payer: Self-pay

## 2021-09-26 ENCOUNTER — Encounter (HOSPITAL_BASED_OUTPATIENT_CLINIC_OR_DEPARTMENT_OTHER): Payer: Self-pay | Admitting: *Deleted

## 2021-09-26 ENCOUNTER — Emergency Department (HOSPITAL_BASED_OUTPATIENT_CLINIC_OR_DEPARTMENT_OTHER)
Admission: EM | Admit: 2021-09-26 | Discharge: 2021-09-26 | Disposition: A | Discharge status: Home / Self Care | Payer: Medicare HMO | Attending: Emergency Medicine | Admitting: Emergency Medicine

## 2021-09-26 DIAGNOSIS — I2510 Atherosclerotic heart disease of native coronary artery without angina pectoris: Secondary | ICD-10-CM | POA: Diagnosis not present

## 2021-09-26 DIAGNOSIS — M5430 Sciatica, unspecified side: Secondary | ICD-10-CM

## 2021-09-26 DIAGNOSIS — E119 Type 2 diabetes mellitus without complications: Secondary | ICD-10-CM | POA: Insufficient documentation

## 2021-09-26 DIAGNOSIS — I10 Essential (primary) hypertension: Secondary | ICD-10-CM | POA: Insufficient documentation

## 2021-09-26 DIAGNOSIS — R0602 Shortness of breath: Secondary | ICD-10-CM

## 2021-09-26 DIAGNOSIS — M7989 Other specified soft tissue disorders: Secondary | ICD-10-CM

## 2021-09-26 DIAGNOSIS — Z87891 Personal history of nicotine dependence: Secondary | ICD-10-CM | POA: Diagnosis not present

## 2021-09-26 DIAGNOSIS — Z20822 Contact with and (suspected) exposure to covid-19: Secondary | ICD-10-CM | POA: Diagnosis not present

## 2021-09-26 DIAGNOSIS — M545 Low back pain, unspecified: Secondary | ICD-10-CM | POA: Diagnosis present

## 2021-09-26 DIAGNOSIS — Z79899 Other long term (current) drug therapy: Secondary | ICD-10-CM | POA: Insufficient documentation

## 2021-09-26 LAB — RESP PANEL BY RT-PCR (FLU A&B, COVID) ARPGX2
Influenza A by PCR: NEGATIVE
Influenza B by PCR: NEGATIVE
SARS Coronavirus 2 by RT PCR: NEGATIVE

## 2021-09-26 LAB — CBC WITH DIFFERENTIAL/PLATELET
Abs Immature Granulocytes: 0.1 10*3/uL — ABNORMAL HIGH (ref 0.00–0.07)
Basophils Absolute: 0.1 10*3/uL (ref 0.0–0.1)
Basophils Relative: 1 %
Eosinophils Absolute: 0.1 10*3/uL (ref 0.0–0.5)
Eosinophils Relative: 1 %
HCT: 34.7 % — ABNORMAL LOW (ref 39.0–52.0)
Hemoglobin: 11.1 g/dL — ABNORMAL LOW (ref 13.0–17.0)
Immature Granulocytes: 1 %
Lymphocytes Relative: 10 %
Lymphs Abs: 1.1 10*3/uL (ref 0.7–4.0)
MCH: 30.2 pg (ref 26.0–34.0)
MCHC: 32 g/dL (ref 30.0–36.0)
MCV: 94.3 fL (ref 80.0–100.0)
Monocytes Absolute: 1 10*3/uL (ref 0.1–1.0)
Monocytes Relative: 9 %
Neutro Abs: 8.3 10*3/uL — ABNORMAL HIGH (ref 1.7–7.7)
Neutrophils Relative %: 78 %
Platelets: 291 10*3/uL (ref 150–400)
RBC: 3.68 MIL/uL — ABNORMAL LOW (ref 4.22–5.81)
RDW: 14.6 % (ref 11.5–15.5)
WBC: 10.6 10*3/uL — ABNORMAL HIGH (ref 4.0–10.5)
nRBC: 0 % (ref 0.0–0.2)

## 2021-09-26 LAB — COMPREHENSIVE METABOLIC PANEL
ALT: 19 U/L (ref 0–44)
AST: 20 U/L (ref 15–41)
Albumin: 3.9 g/dL (ref 3.5–5.0)
Alkaline Phosphatase: 63 U/L (ref 38–126)
Anion gap: 8 (ref 5–15)
BUN: 21 mg/dL (ref 8–23)
CO2: 30 mmol/L (ref 22–32)
Calcium: 8.7 mg/dL — ABNORMAL LOW (ref 8.9–10.3)
Chloride: 97 mmol/L — ABNORMAL LOW (ref 98–111)
Creatinine, Ser: 0.91 mg/dL (ref 0.61–1.24)
GFR, Estimated: >60 mL/min (ref >60)
Glucose, Bld: 107 mg/dL — ABNORMAL HIGH (ref 70–99)
Potassium: 4.8 mmol/L (ref 3.5–5.1)
Sodium: 135 mmol/L (ref 135–145)
Total Bilirubin: 0.5 mg/dL (ref 0.3–1.2)
Total Protein: 6.8 g/dL (ref 6.5–8.1)

## 2021-09-26 LAB — TROPONIN I (HIGH SENSITIVITY): Troponin I (High Sensitivity): 4 ng/L (ref <18)

## 2021-09-26 LAB — BRAIN NATRIURETIC PEPTIDE: B Natriuretic Peptide: 20.4 pg/mL (ref 0.0–100.0)

## 2021-09-26 MED ORDER — HYDROMORPHONE HCL 1 MG/ML IJ SOLN
1.0000 mg | Freq: Once | INTRAMUSCULAR | Status: AC
  Administered 2021-09-26: 1 mg via INTRAMUSCULAR
  Filled 2021-09-26: qty 1

## 2021-09-26 MED ORDER — MORPHINE SULFATE (PF) 4 MG/ML IV SOLN
4.0000 mg | Freq: Once | INTRAVENOUS | Status: AC
  Administered 2021-09-26: 4 mg via INTRAVENOUS
  Filled 2021-09-26: qty 1

## 2021-09-26 MED ORDER — ZINC OXIDE 11.3 % EX CREA
TOPICAL_CREAM | Freq: Two times a day (BID) | CUTANEOUS | Status: AC
  Filled 2021-09-26: qty 56

## 2021-09-26 MED ORDER — FUROSEMIDE 20 MG PO TABS: 20.0000 mg | ORAL_TABLET | Freq: Every day | ORAL | 0 refills | Status: DC

## 2021-09-26 MED ORDER — FUROSEMIDE 40 MG PO TABS
40.0000 mg | ORAL_TABLET | Freq: Once | ORAL | Status: AC
  Administered 2021-09-26: 40 mg via ORAL
  Filled 2021-09-26: qty 1

## 2021-09-26 MED ORDER — HYDROMORPHONE HCL 1 MG/ML IJ SOLN
1.0000 mg | Freq: Once | INTRAMUSCULAR | Status: AC
  Administered 2021-09-26: 1 mg via INTRAVENOUS
  Filled 2021-09-26: qty 1

## 2021-09-26 NOTE — ED Triage Notes (Signed)
Awoke with shortness of breath and strong cough, bilateral lower extremity edema also noted upon arrival via [facility]

## 2021-09-26 NOTE — ED Provider Notes (Signed)
[facility] EMERGENCY DEPARTMENT Provider Note   CSN: [id number] Arrival date & time: 09/26/21  4315     History Chief Complaint  Patient presents with   Shortness of Breath    Jorge Sherman is a 80 y.o. male.  Presents to ER with concern for shortness of breath, leg swelling.  Also complaining of low back pain radiating down the legs, primarily left leg.  Patient reports that he has had leg swelling before and seems to be worse today.  Also woke up this morning feeling somewhat short of breath.  Denies any associated chest pain or chest tightness.  Also states that his sciatica seems to be flaring up today.  Pain radiating down left leg.  Severe, up to 10 out of 10 in severity.  Similar to prior.  No numbness or weakness, no bladder or bowel incontinence.  No fevers.  HPI     Past Medical History:  Diagnosis Date   Chronic pain    Depressive disorder, not elsewhere classified    Dizziness and giddiness    Essential and other specified forms of tremor    Headache(784.0)    Leukocytosis, unspecified    Obstructive sleep apnea (adult) (pediatric)    Pain in joint, lower leg    Septic joint of left knee joint ([place]) 04/22/2019   Spinal stenosis, lumbar region, without neurogenic claudication    Thoracic or lumbosacral neuritis or radiculitis, unspecified    Type II or unspecified type diabetes mellitus without mention of complication, not stated as uncontrolled    Unspecified cardiovascular disease    Unspecified essential hypertension     Patient Active Problem List   Diagnosis Date Noted   Acute respiratory failure with hypercapnia ([place]) 02/14/2021   Bilious emesis 02/14/2021   Acute hypercapnic respiratory failure ([place]) 02/14/2021   Hyponatremia 11/11/2020   Falls 10/30/2020   Dehydration with hyponatremia 10/29/2020   Fall at home, initial encounter 10/29/2020   Musculoskeletal chest pain 10/29/2020   Mixed hyperlipidemia due to type 2 diabetes mellitus ([place])  10/29/2020   Benign prostatic hyperplasia without lower urinary tract symptoms 10/29/2020   Ileus ([place])    Declining functional status    Goals of care, counseling/discussion    Palliative care by specialist    AKI (acute kidney injury) ([place]) 06/27/2020   Acute diarrhea 06/27/2020   Altered mental status 08/28/2019   Septic joint of left knee joint ([place]) 04/22/2019   Erectile dysfunction 10/30/2013   Essential hypertension, benign 10/30/2013   Coronary artery disease involving native coronary artery of native heart without angina pectoris 10/30/2013   Dizziness and giddiness    Pain in joint, lower leg    Type 2 diabetes mellitus without complication, with long-term current use of insulin (HCC)    Obstructive sleep apnea    Depressive disorder, not elsewhere classified    Essential and other specified forms of tremor    Headache(784.0)    Spinal stenosis, lumbar region, without neurogenic claudication    Thoracic or lumbosacral neuritis or radiculitis, unspecified    Chronic pain syndrome     Past Surgical History:  Procedure Laterality Date   BACK SURGERY     back surgey     times 3   IR INJECT/THERA/INC NEEDLE/CATH/PLC EPI/LUMB/SAC W/IMG  11/26/2020       Family History  Problem Relation Age of Onset   Cancer Father    Heart disease Father    Stroke Mother    Diabetes Mother  Social History   Tobacco Use   Smoking status: Former   Smokeless tobacco: Never  Scientific laboratory technician Use: Never used  Substance Use Topics   Alcohol use: No   Drug use: No    Home Medications Prior to Admission medications   Medication Sig Start Date End Date Taking? Authorizing Provider  furosemide (LASIX) 20 MG tablet Take 1 tablet (20 mg total) by mouth daily for 4 days. 09/26/21 09/30/21 Yes Guadalupe Kerekes, Ellwood Dense, MD  acetaminophen (TYLENOL) 500 MG tablet Take 2 tablets (1,000 mg total) by mouth every 6 (six) hours as needed for headache, mild pain or fever. 11/28/20   Dwyane Dee,  MD  albuterol (VENTOLIN HFA) 108 (90 Base) MCG/ACT inhaler Inhale 2 puffs into the lungs every 4 (four) hours as needed for wheezing or shortness of breath. 11/03/20   Pokhrel, Corrie Mckusick, MD  amLODipine (NORVASC) 10 MG tablet Take 1 tablet (10 mg total) by mouth daily. 11/29/20   Dwyane Dee, MD  atorvastatin (LIPITOR) 80 MG tablet Take 80 mg by mouth daily. 12/14/20   [provider]  celecoxib (CELEBREX) 200 MG capsule Take 200 mg by mouth daily.    [provider]  cloNIDine (CATAPRES) 0.1 MG tablet Take 0.1 mg by mouth 2 (two) times daily.     [provider]  diclofenac Sodium (VOLTAREN) 1 % GEL Apply 2 g topically 4 (four) times daily. 04/29/21   Couture, Cortni S, PA-C  DULoxetine (CYMBALTA) 30 MG capsule Take 1 capsule (30 mg total) by mouth 2 (two) times daily. 02/24/21   Aline [place], MD  ferrous sulfate 325 (65 FE) MG tablet Take 325 mg by mouth daily.     [provider]  folic acid (FOLVITE) 1 MG tablet Take 1 tablet (1 mg total) by mouth daily. 02/25/21   Aline [place], MD  gabapentin (NEURONTIN) 300 MG capsule Take 1 capsule (300 mg total) by mouth 3 (three) times daily. Patient taking differently: Take 600 mg by mouth 3 (three) times daily. (Take with 100mg  to equal a total dose of 700mg .) 11/28/20   Dwyane Dee, MD  hydroxychloroquine (PLAQUENIL) 200 MG tablet Take 200 mg by mouth 2 (two) times daily.    [provider]  lidocaine (LIDODERM) 5 % Place 1 patch onto the skin daily. Remove & Discard patch within 12 hours or as directed by MD 09/23/21   Jacqlyn Larsen, PA-C  linaclotide [facility]) 72 MCG capsule Take 72 mcg by mouth daily as needed (constipation).    [provider]  loperamide (IMODIUM A-D) 2 MG tablet Take 1 tablet (2 mg total) by mouth 4 (four) times daily as needed for diarrhea or loose stools. 07/04/20   Georgette Shell, MD  LORazepam (ATIVAN) 1 MG tablet Take 0.5 tablets (0.5 mg total) by mouth every 8 (eight)  hours as needed for anxiety. 02/24/21   Aline [place], MD  Menthol, Topical Analgesic, (BIOFREEZE EX) Apply 1 application topically daily. To back of neck and lower back.    [provider]  methocarbamol (ROBAXIN) 500 MG tablet Take 1 tablet (500 mg total) by mouth 2 (two) times daily as needed for muscle spasms (hold for sedation). 02/24/21   Aline [place], MD  methocarbamol (ROBAXIN) 500 MG tablet Take 1 tablet (500 mg total) by mouth 2 (two) times daily. 05/04/21   Lacretia Leigh, MD  Multiple Vitamin (MULTIVITAMIN WITH MINERALS) TABS tablet Take 1 tablet by mouth daily. 11/29/20   Dwyane Dee, MD  ondansetron (ZOFRAN) 4 MG tablet Take 1 tablet (4 mg total) by mouth every 6 (six) hours as needed for nausea. 07/04/20   Georgette Shell, MD  oxyCODONE-acetaminophen (PERCOCET/ROXICET) 5-325 MG tablet Take 1 tablet by mouth every 6 (six) hours as needed for severe pain. 05/10/21   Hayden Rasmussen, MD  pantoprazole (PROTONIX) 40 MG tablet Take 1 tablet (40 mg total) by mouth daily. 11/29/20   Dwyane Dee, MD  polyethylene glycol (MIRALAX / GLYCOLAX) 17 g packet Take 17 g by mouth daily as needed for mild constipation. 11/03/20   Pokhrel, Corrie Mckusick, MD  predniSONE (DELTASONE) 20 MG tablet Take 2 tablets (40 mg total) by mouth daily with breakfast. For the next four days 06/03/21   Carmin Muskrat, MD  rosuvastatin (CRESTOR) 40 MG tablet Take 40 mg by mouth every evening.     [provider]  SUMAtriptan (IMITREX) 25 MG tablet Take 25 mg by mouth daily as needed for migraine. May repeat in 2 hours if headache persists or recurs.    [provider]  tamsulosin (FLOMAX) 0.4 MG CAPS capsule Take 0.4 mg by mouth daily.    [provider]  thiamine 100 MG tablet Take 1 tablet (100 mg total) by mouth daily. 02/25/21   Aline [place], MD    Allergies    Cefepime, Iohexol, Fentanyl, and Iodine  Review of Systems   Review of Systems  Constitutional:  Negative for chills,  diaphoresis, fatigue and fever.  HENT:  Negative for ear pain and sore throat.   Eyes:  Negative for pain and visual disturbance.  Respiratory:  Positive for shortness of breath. Negative for cough.   Cardiovascular:  Positive for leg swelling. Negative for chest pain and palpitations.  Gastrointestinal:  Negative for abdominal pain and vomiting.  Genitourinary:  Negative for dysuria and hematuria.  Musculoskeletal:  Positive for back pain. Negative for arthralgias.  Skin:  Negative for color change and rash.  Neurological:  Negative for seizures and syncope.  All other systems reviewed and are negative.  Physical Exam Updated Vital Signs BP (!) 164/86   Pulse 94   Temp 97.7 F (36.5 C) (Oral)   Resp 16   Ht 5\' 10"  (1.778 m)   Wt 122.6 kg   SpO2 97%   BMI 38.78 kg/m   Physical Exam Vitals and nursing note reviewed.  Constitutional:      Appearance: He is well-developed.     Comments: Chronically ill-appearing  HENT:     Head: Normocephalic and atraumatic.  Eyes:     Conjunctiva/sclera: Conjunctivae normal.  Cardiovascular:     Rate and Rhythm: Normal rate and regular rhythm.     Heart sounds: No murmur heard. Pulmonary:     Effort: Pulmonary effort is normal. No respiratory distress.     Breath sounds: Normal breath sounds.  Abdominal:     Palpations: Abdomen is soft.     Tenderness: There is no abdominal tenderness.  Musculoskeletal:     Cervical back: Neck supple.     Comments: There is bilateral lower lower leg edema, both feet feel warm and well-perfused, distal pulses intact  Skin:    General: Skin is warm and dry.  Neurological:     General: No focal deficit present.     Mental Status: He is alert.     Comments: Strength sensation intact in lower extremities    ED Results / Procedures / Treatments   Labs (all labs ordered are listed, but only abnormal  results are displayed) Labs Reviewed  CBC WITH DIFFERENTIAL/PLATELET - Abnormal; Notable for the  following components:      Result Value   WBC 10.6 (*)    RBC 3.68 (*)    Hemoglobin 11.1 (*)    HCT 34.7 (*)    Neutro Abs 8.3 (*)    Abs Immature Granulocytes 0.10 (*)    All other components within normal limits  COMPREHENSIVE METABOLIC PANEL - Abnormal; Notable for the following components:   Chloride 97 (*)    Glucose, Bld 107 (*)    Calcium 8.7 (*)    All other components within normal limits  RESP PANEL BY RT-PCR (FLU A&B, COVID) [id number]  BRAIN NATRIURETIC PEPTIDE  TROPONIN I (HIGH SENSITIVITY)    EKG EKG Interpretation  Date/Time:  Sunday September 26 2021 10:04:23 EST Ventricular Rate:  87 PR Interval:  230 QRS Duration: 95 QT Interval:  376 QTC Calculation: 453 R Axis:   -37 Text Interpretation: Sinus rhythm Prolonged PR interval Left axis deviation Confirmed by Adali Pennings ([id number]) on 09/26/2021 11:41:16 AM  Radiology DG Chest Portable 1 View  Result Date: 09/26/2021 CLINICAL DATA:  Shortness of breath. EXAM: PORTABLE CHEST 1 VIEW COMPARISON:  This 08/10/2021 FINDINGS: Cardiomediastinal silhouette is normal. Mediastinal contours appear intact. Tortuosity of the aorta. There is no evidence of focal airspace consolidation, pleural effusion or pneumothorax. Low lung volumes Osseous structures are without acute abnormality. Soft tissues are grossly normal. IMPRESSION: No active disease. Electronically Signed   By: Dobrinka  Dimitrova M.D.   On: 09/26/2021 11:14    Procedures Procedures   Medications Ordered in ED Medications  HYDROmorphone (DILAUDID) injection 1 mg (1 mg Intravenous Given 09/26/21 1056)  morphine 4 MG/ML injection 4 mg (4 mg Intravenous Given 09/26/21 1241)  HYDROmorphone (DILAUDID) injection 1 mg (1 mg Intramuscular Given 09/26/21 1351)  furosemide (LASIX) tablet 40 mg (40 mg Oral Given 09/26/21 1351)    ED Course  I have reviewed the triage vital signs and the nursing notes.  Pertinent labs & imaging results that were available during my care  of the patient were reviewed by me and considered in my medical decision making (see chart for details).    MDM Rules/Calculators/A&P                           80  year old male presents to ER with concern for shortness of breath, leg swelling as well as acute on chronic low back pain.  On exam patient is chronically ill-appearing but not in distress.  Initially was on nasal cannula but he was subsequently placed on room air.  EKG without acute ischemic change, troponin within normal limits.  Had initially recommended getting second troponin however patient adamant about not being stuck for repeat blood testing.  His chest x-ray was clear, no evidence for fluid overload or pneumonia.  Basic labs are stable.  No elevation in BNP.  Per review of chart patient has had similar complaint regarding the leg swelling previously.  Has had multiple DVT studies which in both the August and September study were negative.  Suspect gross swelling today may be chronic or related to venous stasis.  Will give small course of Lasix.  Additionally recommended elevating legs when possible.  Discharged.  Recommended close recheck with primary doctor.  After the discussed management above, the patient was determined to be safe for discharge.  The patient was in agreement with this plan and all questions regarding  their care were answered.  ED return precautions were discussed and the patient will return to the ED with any significant worsening of condition.  Final Clinical Impression(s) / ED Diagnoses Final diagnoses:  Shortness of breath  Sciatica, unspecified laterality  Leg swelling    Rx / DC Orders ED Discharge Orders          Ordered    furosemide (LASIX) 20 MG tablet  Daily        09/26/21 1426             Lucrezia Starch, MD 09/26/21 1432

## 2021-09-26 NOTE — ED Notes (Signed)
ED Provider at bedside. 

## 2021-09-26 NOTE — ED Notes (Signed)
Client arrived via [facility] with c/o awakening with shortness of breath this am, arrived with oxygen via [place] at 2lpm, pt states feels better, was immediately placed in exam room 5, 12 lead ECG obtained, CBG obtained, IV access obtained, placed on cont cardiac monitoring.

## 2021-09-26 NOTE — Discharge Instructions (Addendum)
Recommend taking fluid pill for the next couple days.  Follow-up with your primary care doctor this week.  If you develop difficulty breathing, chest pain or other new concerning symptom, come back to ER for reassessment.  When able I would also recommend elevating your legs, particularly at night.  Can also try compression stockings.

## 2021-10-01 ENCOUNTER — Encounter (HOSPITAL_BASED_OUTPATIENT_CLINIC_OR_DEPARTMENT_OTHER): Payer: Self-pay | Admitting: Emergency Medicine

## 2021-10-01 ENCOUNTER — Emergency Department (HOSPITAL_BASED_OUTPATIENT_CLINIC_OR_DEPARTMENT_OTHER)
Admission: EM | Admit: 2021-10-01 | Discharge: 2021-10-02 | Disposition: A | Discharge status: Home / Self Care | Payer: Medicare HMO | Attending: Emergency Medicine | Admitting: Emergency Medicine

## 2021-10-01 DIAGNOSIS — Z79899 Other long term (current) drug therapy: Secondary | ICD-10-CM | POA: Insufficient documentation

## 2021-10-01 DIAGNOSIS — L03116 Cellulitis of left lower limb: Secondary | ICD-10-CM | POA: Insufficient documentation

## 2021-10-01 DIAGNOSIS — M79605 Pain in left leg: Secondary | ICD-10-CM | POA: Diagnosis present

## 2021-10-01 DIAGNOSIS — E119 Type 2 diabetes mellitus without complications: Secondary | ICD-10-CM | POA: Insufficient documentation

## 2021-10-01 DIAGNOSIS — I2510 Atherosclerotic heart disease of native coronary artery without angina pectoris: Secondary | ICD-10-CM | POA: Insufficient documentation

## 2021-10-01 DIAGNOSIS — I10 Essential (primary) hypertension: Secondary | ICD-10-CM | POA: Diagnosis not present

## 2021-10-01 DIAGNOSIS — Z87891 Personal history of nicotine dependence: Secondary | ICD-10-CM | POA: Insufficient documentation

## 2021-10-01 MED ORDER — MORPHINE SULFATE (PF) 4 MG/ML IV SOLN
4.0000 mg | Freq: Once | INTRAVENOUS | Status: AC
  Administered 2021-10-01: 4 mg via INTRAMUSCULAR
  Filled 2021-10-01: qty 1

## 2021-10-01 MED ORDER — DOXYCYCLINE HYCLATE 100 MG PO TABS
100.0000 mg | ORAL_TABLET | Freq: Once | ORAL | Status: AC
  Administered 2021-10-01: 100 mg via ORAL
  Filled 2021-10-01: qty 1

## 2021-10-01 MED ORDER — KETOROLAC TROMETHAMINE 15 MG/ML IJ SOLN
15.0000 mg | Freq: Once | INTRAMUSCULAR | Status: AC
  Administered 2021-10-01: 15 mg via INTRAMUSCULAR
  Filled 2021-10-01: qty 1

## 2021-10-01 MED ORDER — ACETAMINOPHEN 500 MG PO TABS
1000.0000 mg | ORAL_TABLET | Freq: Once | ORAL | Status: AC
  Administered 2021-10-01: 1000 mg via ORAL
  Filled 2021-10-01: qty 2

## 2021-10-01 MED ORDER — OXYCODONE HCL 5 MG PO TABS
5.0000 mg | ORAL_TABLET | Freq: Once | ORAL | Status: AC
  Administered 2021-10-01: 5 mg via ORAL
  Filled 2021-10-01: qty 1

## 2021-10-01 MED ORDER — DOXYCYCLINE HYCLATE 100 MG PO CAPS: 100.0000 mg | ORAL_CAPSULE | Freq: Two times a day (BID) | ORAL | 0 refills | Status: DC

## 2021-10-01 MED ORDER — DIAZEPAM 2 MG PO TABS
2.0000 mg | ORAL_TABLET | Freq: Once | ORAL | Status: AC
  Administered 2021-10-01: 2 mg via ORAL
  Filled 2021-10-01: qty 1

## 2021-10-01 NOTE — ED Notes (Signed)
Wrapped Pt. Wound per Dr. Tyrone Nine Voice Order

## 2021-10-01 NOTE — ED Notes (Signed)
[facility] has been called and Pt. Waiting to be transported to [facility].

## 2021-10-01 NOTE — Discharge Instructions (Addendum)
Return for rapid spreading redness, fever

## 2021-10-01 NOTE — ED Provider Notes (Signed)
[facility] Provider Note   CSN: [id number] Arrival date & time: 10/01/21  1505     History Chief Complaint  Patient presents with   Leg Pain   Toe Pain    Jorge Sherman is a 80 y.o. male.  80 yo M with a chief complaints of left leg pain and swelling.  This been going on for about 3 weeks now.  The patient suffered a wound to the leg and has developed some erythema to the area.  He tells me that he thinks he had fevers and chills a few days ago but have resolved.  He is here mostly for the burning pain in the leg over the wound.  He denies nausea or vomiting.  Has chronic lower extremity edema that he thinks is not worse than normal.  The history is provided by the patient.  Leg Pain Associated symptoms: no fever   Toe Pain Pertinent negatives include no chest pain, no abdominal pain, no headaches and no shortness of breath.  Illness Severity:  Moderate Onset quality:  Gradual Duration:  3 weeks Timing:  Intermittent Progression:  Worsening Chronicity:  New Associated symptoms: no abdominal pain, no chest pain, no congestion, no diarrhea, no fever, no headaches, no myalgias, no rash, no shortness of breath and no vomiting       Past Medical History:  Diagnosis Date   Chronic pain    Depressive disorder, not elsewhere classified    Dizziness and giddiness    Essential and other specified forms of tremor    Headache(784.0)    Leukocytosis, unspecified    Obstructive sleep apnea (adult) (pediatric)    Pain in joint, lower leg    Septic joint of left knee joint ([place]) 04/22/2019   Spinal stenosis, lumbar region, without neurogenic claudication    Thoracic or lumbosacral neuritis or radiculitis, unspecified    Type II or unspecified type diabetes mellitus without mention of complication, not stated as uncontrolled    Unspecified cardiovascular disease    Unspecified essential hypertension     Patient Active Problem List   Diagnosis Date  Noted   Acute respiratory failure with hypercapnia ([place]) 02/14/2021   Bilious emesis 02/14/2021   Acute hypercapnic respiratory failure ([place]) 02/14/2021   Hyponatremia 11/11/2020   Falls 10/30/2020   Dehydration with hyponatremia 10/29/2020   Fall at home, initial encounter 10/29/2020   Musculoskeletal chest pain 10/29/2020   Mixed hyperlipidemia due to type 2 diabetes mellitus ([place]) 10/29/2020   Benign prostatic hyperplasia without lower urinary tract symptoms 10/29/2020   Ileus ([place])    Declining functional status    Goals of care, counseling/discussion    Palliative care by specialist    AKI (acute kidney injury) ([place]) 06/27/2020   Acute diarrhea 06/27/2020   Altered mental status 08/28/2019   Septic joint of left knee joint ([place]) 04/22/2019   Erectile dysfunction 10/30/2013   Essential hypertension, benign 10/30/2013   Coronary artery disease involving native coronary artery of native heart without angina pectoris 10/30/2013   Dizziness and giddiness    Pain in joint, lower leg    Type 2 diabetes mellitus without complication, with long-term current use of insulin (H[facility])    Obstructive sleep apnea    Depressive disorder, not elsewhere classified    Essential and other specified forms of tremor    Headache(784.0)    Spinal stenosis, lumbar region, without neurogenic claudication    Thoracic or lumbosacral neuritis or radiculitis, unspecified  Chronic pain syndrome     Past Surgical History:  Procedure Laterality Date   BACK SURGERY     back surgey     times 3   IR INJECT/THERA/INC NEEDLE/CATH/PLC EPI/LUMB/SAC W/IMG  11/26/2020       Family History  Problem Relation Age of Onset   Cancer Father    Heart disease Father    Stroke Mother    Diabetes Mother     Social History   Tobacco Use   Smoking status: Former   Smokeless tobacco: Never  Scientific laboratory technician Use: Never used  Substance Use Topics   Alcohol use: No   Drug use: No    Home  Medications Prior to Admission medications   Medication Sig Start Date End Date Taking? Authorizing Provider  doxycycline (VIBRAMYCIN) 100 MG capsule Take 1 capsule (100 mg total) by mouth 2 (two) times daily. One po bid x 7 days 10/01/21  Yes Deno Etienne, DO  acetaminophen (TYLENOL) 500 MG tablet Take 2 tablets (1,000 mg total) by mouth every 6 (six) hours as needed for headache, mild pain or fever. 11/28/20   Dwyane Dee, MD  albuterol (VENTOLIN HFA) 108 (90 Base) MCG/ACT inhaler Inhale 2 puffs into the lungs every 4 (four) hours as needed for wheezing or shortness of breath. 11/03/20   Pokhrel, Corrie Mckusick, MD  amLODipine (NORVASC) 10 MG tablet Take 1 tablet (10 mg total) by mouth daily. 11/29/20   Dwyane Dee, MD  atorvastatin (LIPITOR) 80 MG tablet Take 80 mg by mouth daily. 12/14/20   [provider]  celecoxib (CELEBREX) 200 MG capsule Take 200 mg by mouth daily.    [provider]  cloNIDine (CATAPRES) 0.1 MG tablet Take 0.1 mg by mouth 2 (two) times daily.     [provider]  diclofenac Sodium (VOLTAREN) 1 % GEL Apply 2 g topically 4 (four) times daily. 04/29/21   Couture, Cortni S, PA-C  DULoxetine (CYMBALTA) 30 MG capsule Take 1 capsule (30 mg total) by mouth 2 (two) times daily. 02/24/21   Aline August, MD  ferrous sulfate 325 (65 FE) MG tablet Take 325 mg by mouth daily.     [provider]  folic acid (FOLVITE) 1 MG tablet Take 1 tablet (1 mg total) by mouth daily. 02/25/21   Aline August, MD  furosemide (LASIX) 20 MG tablet Take 1 tablet (20 mg total) by mouth daily for 4 days. 09/26/21 09/30/21  Lucrezia Starch, MD  gabapentin (NEURONTIN) 300 MG capsule Take 1 capsule (300 mg total) by mouth 3 (three) times daily. Patient taking differently: Take 600 mg by mouth 3 (three) times daily. (Take with 100mg  to equal a total dose of 700mg .) 11/28/20   Dwyane Dee, MD  hydroxychloroquine (PLAQUENIL) 200 MG tablet Take 200 mg by mouth 2 (two) times daily.     [provider]  lidocaine (LIDODERM) 5 % Place 1 patch onto the skin daily. Remove & Discard patch within 12 hours or as directed by MD 09/23/21   Jacqlyn Larsen, PA-C  linaclotide [facility]) 72 MCG capsule Take 72 mcg by mouth daily as needed (constipation).    [provider]  loperamide (IMODIUM A-D) 2 MG tablet Take 1 tablet (2 mg total) by mouth 4 (four) times daily as needed for diarrhea or loose stools. 07/04/20   Georgette Shell, MD  LORazepam (ATIVAN) 1 MG tablet Take 0.5 tablets (0.5 mg total) by mouth every 8 (eight) hours as needed for anxiety.  02/24/21   Aline August, MD  Menthol, Topical Analgesic, (BIOFREEZE EX) Apply 1 application topically daily. To back of neck and lower back.    [provider]  methocarbamol (ROBAXIN) 500 MG tablet Take 1 tablet (500 mg total) by mouth 2 (two) times daily as needed for muscle spasms (hold for sedation). 02/24/21   Aline August, MD  methocarbamol (ROBAXIN) 500 MG tablet Take 1 tablet (500 mg total) by mouth 2 (two) times daily. 05/04/21   Lacretia Leigh, MD  Multiple Vitamin (MULTIVITAMIN WITH MINERALS) TABS tablet Take 1 tablet by mouth daily. 11/29/20   Dwyane Dee, MD  ondansetron (ZOFRAN) 4 MG tablet Take 1 tablet (4 mg total) by mouth every 6 (six) hours as needed for nausea. 07/04/20   Georgette Shell, MD  oxyCODONE-acetaminophen (PERCOCET/ROXICET) 5-325 MG tablet Take 1 tablet by mouth every 6 (six) hours as needed for severe pain. 05/10/21   Hayden Rasmussen, MD  pantoprazole (PROTONIX) 40 MG tablet Take 1 tablet (40 mg total) by mouth daily. 11/29/20   Dwyane Dee, MD  polyethylene glycol (MIRALAX / GLYCOLAX) 17 g packet Take 17 g by mouth daily as needed for mild constipation. 11/03/20   Pokhrel, Corrie Mckusick, MD  predniSONE (DELTASONE) 20 MG tablet Take 2 tablets (40 mg total) by mouth daily with breakfast. For the next four days 06/03/21   Carmin Muskrat, MD  rosuvastatin (CRESTOR) 40 MG tablet Take 40 mg by  mouth every evening.     [provider]  SUMAtriptan (IMITREX) 25 MG tablet Take 25 mg by mouth daily as needed for migraine. May repeat in 2 hours if headache persists or recurs.    [provider]  tamsulosin (FLOMAX) 0.4 MG CAPS capsule Take 0.4 mg by mouth daily.    [provider]  thiamine 100 MG tablet Take 1 tablet (100 mg total) by mouth daily. 02/25/21   Aline August, MD    Allergies    Cefepime, Iohexol, Fentanyl, and Iodine  Review of Systems   Review of Systems  Constitutional:  Negative for chills and fever.  HENT:  Negative for congestion and facial swelling.   Eyes:  Negative for discharge and visual disturbance.  Respiratory:  Negative for shortness of breath.   Cardiovascular:  Negative for chest pain and palpitations.  Gastrointestinal:  Negative for abdominal pain, diarrhea and vomiting.  Musculoskeletal:  Negative for arthralgias and myalgias.  Skin:  Positive for color change and wound. Negative for rash.  Neurological:  Negative for tremors, syncope and headaches.  Psychiatric/Behavioral:  Negative for confusion and dysphoric mood.    Physical Exam Updated Vital Signs BP (!) 143/75   Pulse 98   Temp 98.4 F (36.9 C) (Oral)   Resp 17   SpO2 94%   Physical Exam Vitals and nursing note reviewed.  Constitutional:      Appearance: He is well-developed.  HENT:     Head: Normocephalic and atraumatic.  Eyes:     Pupils: Pupils are equal, round, and reactive to light.  Neck:     Vascular: No JVD.  Cardiovascular:     Rate and Rhythm: Normal rate and regular rhythm.     Heart sounds: No murmur heard.   No friction rub. No gallop.  Pulmonary:     Effort: No respiratory distress.     Breath sounds: No wheezing.  Abdominal:     General: There is no distension.     Tenderness: There is no abdominal tenderness. There is no  guarding or rebound.  Musculoskeletal:        General: Swelling and tenderness present. Normal range of  motion.     Cervical back: Normal range of motion and neck supple.     Comments: Old appearing laceration to the base of the third digit of the left foot.  Granulation tissue intact.  No drainage no induration or fluctuance.  There is an ulceration to the anterior aspect of the lower leg.  No appreciable drainage.  The leg up to the tibial tuberosity is erythematous warm and mildly swollen compared to the other side.  Skin:    Coloration: Skin is not pale.     Findings: No rash.  Neurological:     Mental Status: He is alert and oriented to person, place, and time.  Psychiatric:        Behavior: Behavior normal.    ED Results / Procedures / Treatments   Labs (all labs ordered are listed, but only abnormal results are displayed) Labs Reviewed - No data to display  EKG None  Radiology No results found.  Procedures Procedures   Medications Ordered in ED Medications  oxyCODONE (Oxy IR/ROXICODONE) immediate release tablet 5 mg (has no administration in time range)  diazepam (VALIUM) tablet 2 mg (has no administration in time range)  doxycycline (VIBRA-TABS) tablet 100 mg (100 mg Oral Given 10/01/21 1600)  ketorolac (TORADOL) 15 MG/ML injection 15 mg (15 mg Intramuscular Given 10/01/21 1600)  acetaminophen (TYLENOL) tablet 1,000 mg (1,000 mg Oral Given 10/01/21 1600)  oxyCODONE (Oxy IR/ROXICODONE) immediate release tablet 5 mg (5 mg Oral Given 10/01/21 1600)    ED Course  I have reviewed the triage vital signs and the nursing notes.  Pertinent labs & imaging results that were available during my care of the patient were reviewed by me and considered in my medical decision making (see chart for details).    MDM Rules/Calculators/A&P                           80 yo M with a chief complaints of left leg pain and swelling.  Going on for about 3 weeks now.  Stated he had fevers a few days ago but is resolved.  Clinically the patient has cellulitis.  Will start on antibiotics.  He  has wound care already scheduled.  We will have him return for recurrent fevers or rapid spreading redness.  5:33 PM:  I have discussed the diagnosis/risks/treatment options with the patient and believe the pt to be eligible for discharge home to follow-up with PCP. We also discussed returning to the ED immediately if new or worsening sx occur. We discussed the sx which are most concerning (e.g., sudden worsening pain, fever, inability to tolerate by mouth) that necessitate immediate return. Medications administered to the patient during their visit and any new prescriptions provided to the patient are listed below.  Medications given during this visit Medications  oxyCODONE (Oxy IR/ROXICODONE) immediate release tablet 5 mg (has no administration in time range)  diazepam (VALIUM) tablet 2 mg (has no administration in time range)  doxycycline (VIBRA-TABS) tablet 100 mg (100 mg Oral Given 10/01/21 1600)  ketorolac (TORADOL) 15 MG/ML injection 15 mg (15 mg Intramuscular Given 10/01/21 1600)  acetaminophen (TYLENOL) tablet 1,000 mg (1,000 mg Oral Given 10/01/21 1600)  oxyCODONE (Oxy IR/ROXICODONE) immediate release tablet 5 mg (5 mg Oral Given 10/01/21 1600)     The patient appears reasonably screen and/or stabilized for  discharge and I doubt any other medical condition or other [facility] requiring further screening, evaluation, or treatment in the ED at this time prior to discharge.   Final Clinical Impression(s) / ED Diagnoses Final diagnoses:  Cellulitis of left lower extremity    Rx / DC Orders ED Discharge Orders          Ordered    doxycycline (VIBRAMYCIN) 100 MG capsule  2 times daily        10/01/21 [place], [place], DO 10/01/21 1733

## 2021-10-01 NOTE — ED Notes (Signed)
Xeroform and non stick dressing with cloth tape applied to wound.

## 2021-10-01 NOTE — ED Triage Notes (Signed)
Pt from [employer] c/o pain and swelling in left shin and 3rd toe on left foot x 3 days. Describes pain as a burning sensation.

## 2021-10-04 ENCOUNTER — Encounter (HOSPITAL_BASED_OUTPATIENT_CLINIC_OR_DEPARTMENT_OTHER): Payer: Self-pay

## 2021-10-04 ENCOUNTER — Emergency Department (HOSPITAL_BASED_OUTPATIENT_CLINIC_OR_DEPARTMENT_OTHER)
Admission: EM | Admit: 2021-10-04 | Discharge: 2021-10-04 | Disposition: A | Discharge status: Home / Self Care | Payer: Medicare HMO | Attending: Emergency Medicine | Admitting: Emergency Medicine

## 2021-10-04 ENCOUNTER — Other Ambulatory Visit: Payer: Self-pay

## 2021-10-04 DIAGNOSIS — M79605 Pain in left leg: Secondary | ICD-10-CM | POA: Insufficient documentation

## 2021-10-04 DIAGNOSIS — E782 Mixed hyperlipidemia: Secondary | ICD-10-CM | POA: Insufficient documentation

## 2021-10-04 DIAGNOSIS — Z87891 Personal history of nicotine dependence: Secondary | ICD-10-CM | POA: Diagnosis not present

## 2021-10-04 DIAGNOSIS — L03116 Cellulitis of left lower limb: Secondary | ICD-10-CM

## 2021-10-04 DIAGNOSIS — E1169 Type 2 diabetes mellitus with other specified complication: Secondary | ICD-10-CM | POA: Diagnosis not present

## 2021-10-04 DIAGNOSIS — I10 Essential (primary) hypertension: Secondary | ICD-10-CM | POA: Diagnosis not present

## 2021-10-04 DIAGNOSIS — I2510 Atherosclerotic heart disease of native coronary artery without angina pectoris: Secondary | ICD-10-CM | POA: Insufficient documentation

## 2021-10-04 DIAGNOSIS — Z79899 Other long term (current) drug therapy: Secondary | ICD-10-CM | POA: Insufficient documentation

## 2021-10-04 MED ORDER — HYDROMORPHONE HCL 1 MG/ML IJ SOLN
1.0000 mg | Freq: Once | INTRAMUSCULAR | Status: AC
  Administered 2021-10-04: 1 mg via INTRAMUSCULAR
  Filled 2021-10-04: qty 1

## 2021-10-04 MED ORDER — AMOXICILLIN-POT CLAVULANATE 875-125 MG PO TABS
1.0000 | ORAL_TABLET | Freq: Once | ORAL | Status: AC
  Administered 2021-10-04: 1 via ORAL
  Filled 2021-10-04: qty 1

## 2021-10-04 MED ORDER — AMOXICILLIN-POT CLAVULANATE 500-125 MG PO TABS: 1.0000 | ORAL_TABLET | Freq: Three times a day (TID) | ORAL | 0 refills | Status: DC

## 2021-10-04 NOTE — Progress Notes (Signed)
Patient stated that he is feeling much better and is ready to go.  Patient's SPO2 is between 94% and 97% on Room Air.

## 2021-10-04 NOTE — ED Triage Notes (Signed)
Wanting something for pain, said that he is not taking the antibiotic like he is supposed to

## 2021-10-04 NOTE — ED Provider Notes (Signed)
[facility] Provider Note   CSN: [id number] Arrival date & time: 10/04/21  0406     History Chief Complaint  Patient presents with   Leg Pain    Complaining of pain in the left leg, has cellulitis in it, has been seen for it before. Has been prescribed doxycycline for it. Due to finish 11/20    MASSEY RUHLAND is a 80 y.o. male.  Patient is an 80 year old male with past medical history of type 2 diabetes, hypertension, obstructive sleep apnea, and chronic back and leg pain.  Patient seen here 3 days ago with complaints of pain and swelling to his left leg.  He was diagnosed with cellulitis and started on doxycycline.  Patient lives in an extended care facility and tells me that they have not been giving him his antibiotics.  He denies fevers or chills.  The history is provided by the patient.  Leg Pain Location:  Leg Pain details:    Quality:  Aching   Radiates to:  Does not radiate   Severity:  Moderate   Timing:  Constant   Progression:  Worsening Relieved by:  Nothing Worsened by:  Nothing     Past Medical History:  Diagnosis Date   Chronic pain    Depressive disorder, not elsewhere classified    Dizziness and giddiness    Essential and other specified forms of tremor    Headache(784.0)    Leukocytosis, unspecified    Obstructive sleep apnea (adult) (pediatric)    Pain in joint, lower leg    Septic joint of left knee joint ([place]) 04/22/2019   Spinal stenosis, lumbar region, without neurogenic claudication    Thoracic or lumbosacral neuritis or radiculitis, unspecified    Type II or unspecified type diabetes mellitus without mention of complication, not stated as uncontrolled    Unspecified cardiovascular disease    Unspecified essential hypertension     Patient Active Problem List   Diagnosis Date Noted   Acute respiratory failure with hypercapnia ([place]) 02/14/2021   Bilious emesis 02/14/2021   Acute hypercapnic respiratory failure  ([place]) 02/14/2021   Hyponatremia 11/11/2020   Falls 10/30/2020   Dehydration with hyponatremia 10/29/2020   Fall at home, initial encounter 10/29/2020   Musculoskeletal chest pain 10/29/2020   Mixed hyperlipidemia due to type 2 diabetes mellitus ([place]) 10/29/2020   Benign prostatic hyperplasia without lower urinary tract symptoms 10/29/2020   Ileus ([place])    Declining functional status    Goals of care, counseling/discussion    Palliative care by specialist    AKI (acute kidney injury) ([place]) 06/27/2020   Acute diarrhea 06/27/2020   Altered mental status 08/28/2019   Septic joint of left knee joint ([place]) 04/22/2019   Erectile dysfunction 10/30/2013   Essential hypertension, benign 10/30/2013   Coronary artery disease involving native coronary artery of native heart without angina pectoris 10/30/2013   Dizziness and giddiness    Pain in joint, lower leg    Type 2 diabetes mellitus without complication, with long-term current use of insulin (HCC)    Obstructive sleep apnea    Depressive disorder, not elsewhere classified    Essential and other specified forms of tremor    Headache(784.0)    Spinal stenosis, lumbar region, without neurogenic claudication    Thoracic or lumbosacral neuritis or radiculitis, unspecified    Chronic pain syndrome     Past Surgical History:  Procedure Laterality Date   BACK SURGERY     back surgey  times 3   IR INJECT/THERA/INC NEEDLE/CATH/PLC EPI/LUMB/SAC W/IMG  11/26/2020       Family History  Problem Relation Age of Onset   Cancer Father    Heart disease Father    Stroke Mother    Diabetes Mother     Social History   Tobacco Use   Smoking status: Former   Smokeless tobacco: Never  Scientific laboratory technician Use: Never used  Substance Use Topics   Alcohol use: No   Drug use: No    Home Medications Prior to Admission medications   Medication Sig Start Date End Date Taking? Authorizing Provider  acetaminophen (TYLENOL) 500 MG tablet Take  2 tablets (1,000 mg total) by mouth every 6 (six) hours as needed for headache, mild pain or fever. 11/28/20   Dwyane Dee, MD  albuterol (VENTOLIN HFA) 108 (90 Base) MCG/ACT inhaler Inhale 2 puffs into the lungs every 4 (four) hours as needed for wheezing or shortness of breath. 11/03/20   Pokhrel, Corrie Mckusick, MD  amLODipine (NORVASC) 10 MG tablet Take 1 tablet (10 mg total) by mouth daily. 11/29/20   Dwyane Dee, MD  atorvastatin (LIPITOR) 80 MG tablet Take 80 mg by mouth daily. 12/14/20   [provider]  celecoxib (CELEBREX) 200 MG capsule Take 200 mg by mouth daily.    [provider]  cloNIDine (CATAPRES) 0.1 MG tablet Take 0.1 mg by mouth 2 (two) times daily.     [provider]  diclofenac Sodium (VOLTAREN) 1 % GEL Apply 2 g topically 4 (four) times daily. 04/29/21   Couture, Cortni S, PA-C  doxycycline (VIBRAMYCIN) 100 MG capsule Take 1 capsule (100 mg total) by mouth 2 (two) times daily. One po bid x 7 days 10/01/21   Deno Etienne, DO  DULoxetine (CYMBALTA) 30 MG capsule Take 1 capsule (30 mg total) by mouth 2 (two) times daily. 02/24/21   Aline August, MD  ferrous sulfate 325 (65 FE) MG tablet Take 325 mg by mouth daily.     [provider]  folic acid (FOLVITE) 1 MG tablet Take 1 tablet (1 mg total) by mouth daily. 02/25/21   Aline August, MD  furosemide (LASIX) 20 MG tablet Take 1 tablet (20 mg total) by mouth daily for 4 days. 09/26/21 09/30/21  Lucrezia Starch, MD  gabapentin (NEURONTIN) 300 MG capsule Take 1 capsule (300 mg total) by mouth 3 (three) times daily. Patient taking differently: Take 600 mg by mouth 3 (three) times daily. (Take with 100mg  to equal a total dose of 700mg .) 11/28/20   Dwyane Dee, MD  hydroxychloroquine (PLAQUENIL) 200 MG tablet Take 200 mg by mouth 2 (two) times daily.    [provider]  lidocaine (LIDODERM) 5 % Place 1 patch onto the skin daily. Remove & Discard patch within 12 hours or as directed by MD 09/23/21    Jacqlyn Larsen, PA-C  linaclotide [facility]) 72 MCG capsule Take 72 mcg by mouth daily as needed (constipation).    [provider]  loperamide (IMODIUM A-D) 2 MG tablet Take 1 tablet (2 mg total) by mouth 4 (four) times daily as needed for diarrhea or loose stools. 07/04/20   Georgette Shell, MD  LORazepam (ATIVAN) 1 MG tablet Take 0.5 tablets (0.5 mg total) by mouth every 8 (eight) hours as needed for anxiety. 02/24/21   Aline August, MD  Menthol, Topical Analgesic, (BIOFREEZE EX) Apply 1 application topically daily. To back of neck and lower back.    [provider]  methocarbamol (ROBAXIN) 500 MG tablet Take 1 tablet (500 mg total) by mouth 2 (two) times daily as needed for muscle spasms (hold for sedation). 02/24/21   Aline August, MD  methocarbamol (ROBAXIN) 500 MG tablet Take 1 tablet (500 mg total) by mouth 2 (two) times daily. 05/04/21   Lacretia Leigh, MD  Multiple Vitamin (MULTIVITAMIN WITH MINERALS) TABS tablet Take 1 tablet by mouth daily. 11/29/20   Dwyane Dee, MD  ondansetron (ZOFRAN) 4 MG tablet Take 1 tablet (4 mg total) by mouth every 6 (six) hours as needed for nausea. 07/04/20   Georgette Shell, MD  oxyCODONE-acetaminophen (PERCOCET/ROXICET) 5-325 MG tablet Take 1 tablet by mouth every 6 (six) hours as needed for severe pain. 05/10/21   Hayden Rasmussen, MD  pantoprazole (PROTONIX) 40 MG tablet Take 1 tablet (40 mg total) by mouth daily. 11/29/20   Dwyane Dee, MD  polyethylene glycol (MIRALAX / GLYCOLAX) 17 g packet Take 17 g by mouth daily as needed for mild constipation. 11/03/20   Pokhrel, Corrie Mckusick, MD  predniSONE (DELTASONE) 20 MG tablet Take 2 tablets (40 mg total) by mouth daily with breakfast. For the next four days 06/03/21   Carmin Muskrat, MD  rosuvastatin (CRESTOR) 40 MG tablet Take 40 mg by mouth every evening.     [provider]  SUMAtriptan (IMITREX) 25 MG tablet Take 25 mg by mouth daily as needed for migraine. May repeat in 2  hours if headache persists or recurs.    [provider]  tamsulosin (FLOMAX) 0.4 MG CAPS capsule Take 0.4 mg by mouth daily.    [provider]  thiamine 100 MG tablet Take 1 tablet (100 mg total) by mouth daily. 02/25/21   Aline August, MD    Allergies    Cefepime, Iohexol, Fentanyl, and Iodine  Review of Systems   Review of Systems  All other systems reviewed and are negative.  Physical Exam Updated Vital Signs BP 129/69 (BP Location: Right Arm)   Pulse 95   Temp 99.2 F (37.3 C) (Oral)   Resp 18   Ht 5\' 11"  (1.803 m)   Wt 107 kg   SpO2 94%   BMI 32.92 kg/m   Physical Exam Vitals and nursing note reviewed.  Constitutional:      General: He is not in acute distress.    Appearance: He is well-developed. He is not diaphoretic.  HENT:     Head: Normocephalic and atraumatic.  Cardiovascular:     Rate and Rhythm: Normal rate and regular rhythm.     Heart sounds: No murmur heard.   No friction rub.  Pulmonary:     Effort: Pulmonary effort is normal. No respiratory distress.     Breath sounds: Normal breath sounds. No wheezing or rales.  Abdominal:     General: Bowel sounds are normal. There is no distension.     Palpations: Abdomen is soft.     Tenderness: There is no abdominal tenderness.  Musculoskeletal:        General: Normal range of motion.     Cervical back: Normal range of motion and neck supple.     Comments: Left lower extremity with dressing in place.  The dressing was removed and reveals some breakdown to the anterior left mid tibia, but no purulent discharge.  There is warmth and erythema of the leg extending to the mid calf.  DP pulses are palpable.  Skin:    General: Skin is warm and dry.  Neurological:     Mental Status: He is alert and oriented to person, place, and time.     Coordination: Coordination normal.    ED Results / Procedures / Treatments   Labs (all labs ordered are listed, but only abnormal results are  displayed) Labs Reviewed - No data to display  EKG None  Radiology No results found.  Procedures Procedures   Medications Ordered in ED Medications  HYDROmorphone (DILAUDID) injection 1 mg (1 mg Intramuscular Given 10/04/21 0458)  amoxicillin-clavulanate (AUGMENTIN) 875-125 MG per tablet 1 tablet (1 tablet Oral Given 10/04/21 0459)    ED Course  I have reviewed the triage vital signs and the nursing notes.  Pertinent labs & imaging results that were available during my care of the patient were reviewed by me and considered in my medical decision making (see chart for details).    MDM Rules/Calculators/A&P  Patient presenting with pain in his left leg.  He was diagnosed with cellulitis 3 days ago.  He tells me they have not been giving him his antibiotic at his extended care facility.  Patient does have some erythema and warmth to the right lower leg.  There is a dressing in place with a small area of skin breakdown that is superficial to the anterior left tibia.  Patient requesting pain medication which was administered.  He will be discharged with Augmentin added to his doxycycline prescription.  Final Clinical Impression(s) / ED Diagnoses Final diagnoses:  None    Rx / DC Orders ED Discharge Orders     None        Veryl Speak, MD 10/04/21 [phone]

## 2021-10-04 NOTE — Progress Notes (Signed)
Patient asked to be put on oxygen.  Patient stated he has that option where he lives but does not always need it.  Placed patient on 3 liter nasal cannula. Patient tolerated well.  RT will continue to monitor.

## 2021-10-04 NOTE — Discharge Instructions (Addendum)
Begin taking Augmentin as prescribed.  Continue taking doxycycline as previously prescribed.  Follow-up with primary doctor in the next few days.

## 2022-02-20 IMAGING — DX DG CHEST 1V PORT
1 series · 1 of 1 positions shown · non-contrast
Comparison: None.

CLINICAL DATA: shoulder and back chronic and bilateral lower ext
swelling x 5 days Lower ext w some redness, States they gave him 1
lasix

EXAM:
PORTABLE CHEST 1 VIEW

[chest ap]
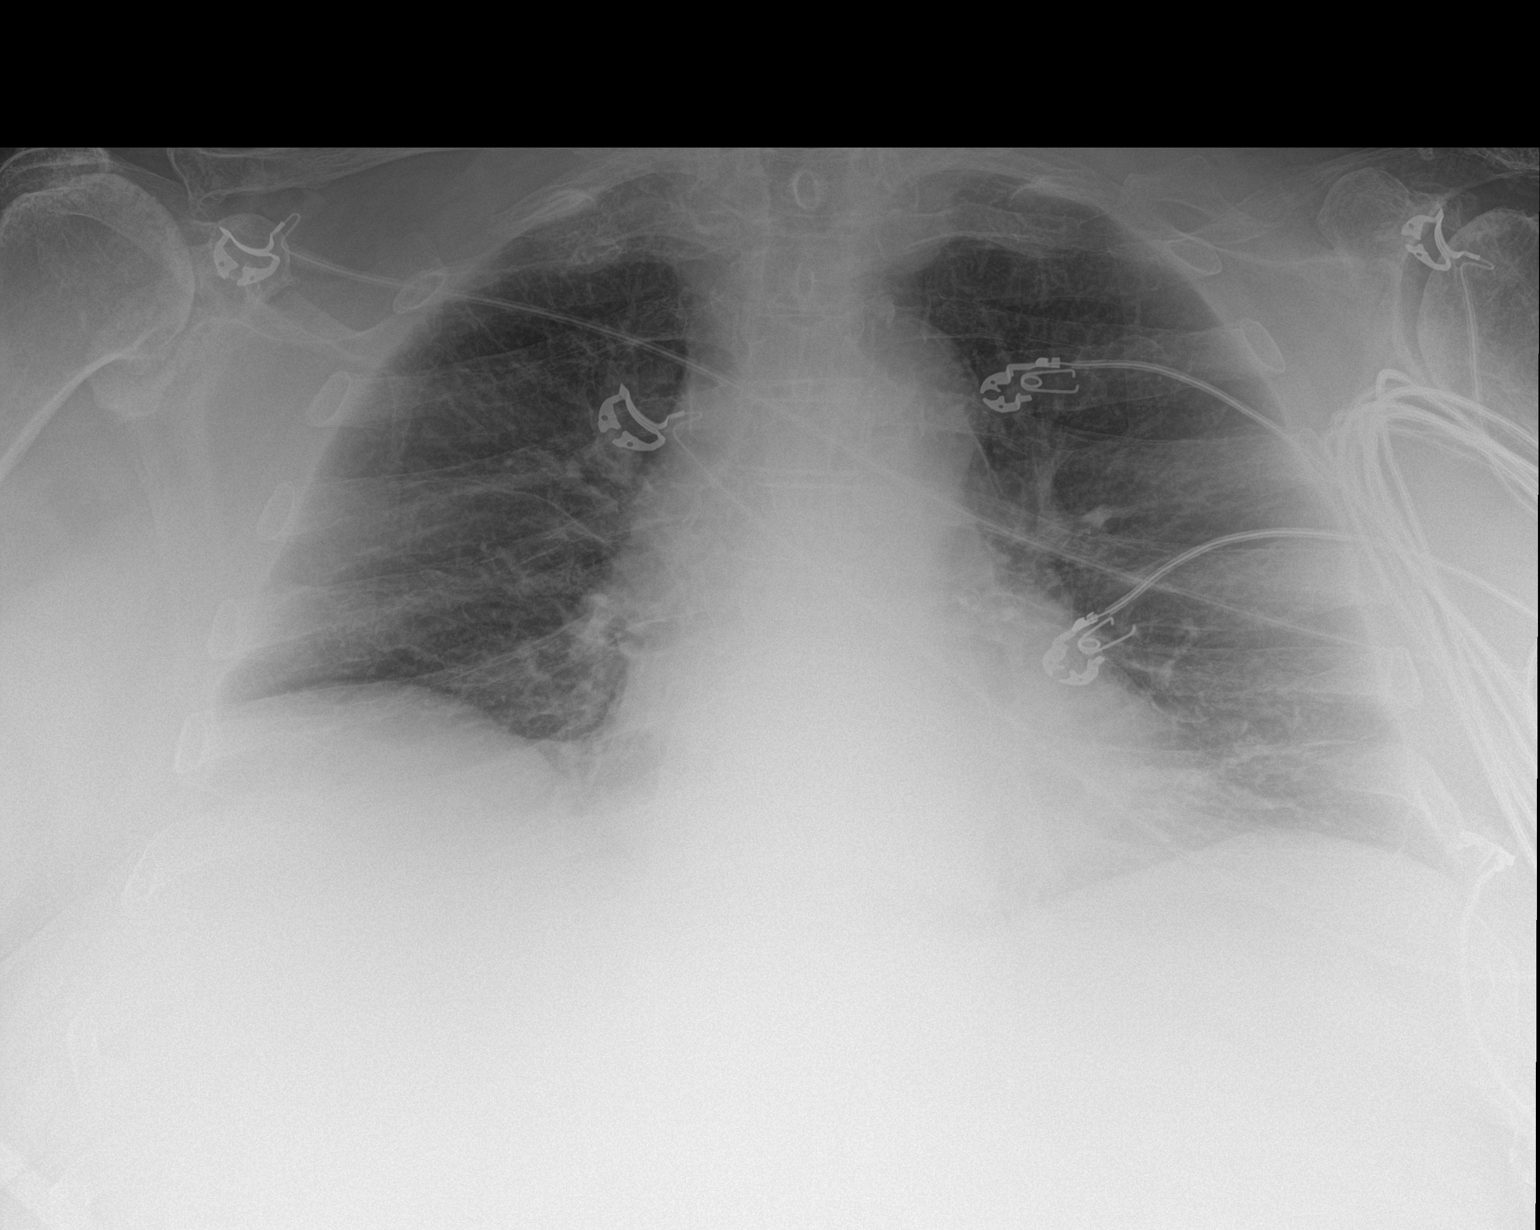

[1 of 1 positions shown; findings below may reference images not displayed]

FINDINGS: The heart and mediastinal contours are slightly more prominent due
to low lung volumes and portable AP technique.

Low lung volumes. No focal consolidation. No pulmonary edema. No
pleural effusion. No pneumothorax.

No acute osseous abnormality.
IMPRESSION: Low lung volumes with no acute cardiopulmonary abnormality.

## 2024-07-01 NOTE — Discharge Summary (Addendum)
 Hospital Medicine Discharge Summary   Demographics: Jorge Sherman y.o. [birth year] MRN: [id number]    Extended Emergency Contact Information Primary Emergency Contact: [facility] Home Phone: [phone] Relation: Daughter  Full Code  Admit Date: 06/25/2024                            Attending Physician: Jennings Graciela Keeler* Discharge Date: 07/01/2024  Primary Care Provider: No Pcp   None  Consults during this admission: Consult Orders             IP CONSULT TO GASTROENTEROLOGY       Specialty:  Gastroenterology  Provider:  (Not yet assigned)      IP CONSULT TO HOSPITALIST       Provider:  (Not yet assigned)              Active & Resolved Diagnosis: Principal Problem (Resolved):   GI bleed Active Problems:   Essential hypertension   Chronic coronary artery disease   Continuous opioid dependence (HCC)   OSA (obstructive sleep apnea)   Anemia   Type 2 diabetes mellitus, without long-term current use of insulin  (HCC) Resolved Problems:   Hypoxia   Acute encephalopathy   Disposition: Patient discharged to SNF / Short term Rehab in fair condition.   Discharge follow-up recommendations : See Hospital Course    Hospital Course: 83 year old gentleman with the above history presenting from a nursing facility for shortness of breath and anemia. He was recently hospitalized here 7/19-7/24 for blood loss anemia secondary to GI bleed.  Received 1 unit packed red blood cell transfusion while hospitalized, GI evaluation with endoscopy and colonoscopy without obvious bleeding.  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  Globin currently stable, no further melena.   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  I discussed with the patient to be on the look out for these. Iron profile acceptable on 06/08/2024 Essential hypertension Not on any medications as per home med review Blood pressure trending up Avoid preload and afterload due to moderate to severe AS, on Lasix  as needed at home.   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  Acute anemia suspected secondary to GI bleed/GAVE.  Hemoglobin 7.2 today.  No further melena.  Hemoglobin dropped again to 6.4, for which she had another unit of PRBC on 8/6 -Hb has improved to 8.1, monitoring for GIB Type 2 diabetes mellitus, without long-term current use of insulin  (HCC) Random blood glucose 105 Blood glucose 3 times daily, SSI only if needed Hypoxia Resolved, currently on room air Chest x-ray on 8/5 with no acute findings Suspect related to sedatives, question aspiration as he was encephalopathic SLP evaluation 20  mg x 1 of Lasix  on 8/6 with moderate to severe aortic stenosis on TTE.  Will need follow-up with cardiology as outpatient.   Acute encephalopathy, resolved On presentation, this has improved,  suspect secondary to sedating medications, patient is on gabapentin  700 mg 3 times daily, Butrans  patch 20 mcg weekly Gabapentin  initiated at 200 mg 3 times daily to avoid withdrawal,  will give Ativan  0.5 mg x 1 today , was on scheduled 0.5 mg twice daily, as patient seems to be  withdrawing with yawning, continue Butrans  patch and morphine  as needed.  Holding trazodone  Patient will need reduced dose of these medications on discharge  Insomnia -Reports that he has not slept in several days -Resume Ativan  and trazodone .   Acute bronchitis, improving - Bronchial hygiene - Some improvement with higher dose dextromethorphan/guaifenesin syrup - RVP negative - Doxycycline  - Switch to Tessalon  Perles and guaifenesin pills   Tension headaches, resolved -Likely due to poor sleep and severe bouts of coughing and congestion - Not relieved with Tylenol  or morphine  - Ordered sumatriptan  - Avoiding NSAIDs due to severe GI bleeds/GAVE    Called and discussed with his daughter [name] over the phone.  Answered her several questions.  During rounds today patient was seen lying in bed in no apparent distress.  As above with complaint that he has been having difficulty sleeping because his Ativan  was called.  It was reported that yesterday afternoon 4 PM he got Ativan  but still did not sleep, it is only 0.5 mg.  Appropriate for discharge with reinstating sleep aids.             Wound / Incision Assessment: Refer to Chart Review and Media Tab for images if available.   Wound 06/08/24 Pretibial Right (Active)  Date First Assessed: 06/08/24   Location: Pretibial  Wound Location Orientation: Right     Wound 06/08/24 Pretibial Left (Active)  Date First Assessed: 06/08/24   Location: Pretibial  Wound Location Orientation: Left     Wound 06/08/24 Skin Tear Arm Anterior;Lower;Right (Active)  Date First Assessed: 06/08/24   Primary Wound Type: Skin Tear  Location: Arm  Wound Location Orientation: Anterior;Lower;Right     Wound 06/08/24 Arm Anterior;Left;Lower (Active)  Date First Assessed: 06/08/24   Location: Arm  Wound Location Orientation: Anterior;Left;Lower     Wound 06/08/24 Toe D2, second Anterior;Left (Active)  Date First Assessed: 06/08/24   Pre-Existing Wound:  Yes  Location: Toe D2, second  Wound Location Orientation: Anterior;Left     Wound 06/08/24 Sacrum (Active)  Date First Assessed: 06/08/24   Location: Sacrum     Wound 06/08/24 Arm Anterior;Proximal;Right;Upper (Active)  Date First Assessed: 06/08/24   Location: Arm  Wound Location Orientation: Anterior;Proximal;Right;Upper  Wound Description (Comments): underarm redness    Temp:  [97.7 F (36.5 C)-99 F (37.2 C)] 99 F (37.2 C) Heart Rate:  [77-86] 79 Resp:  [16-18] 18 BP: (108-152)/(57-80) 131/58      Discharge Medications     New Medications      Sig Disp Refill Start End  albuterol  HFA 90 mcg/actuation inhaler Commonly known as: PROVENTIL  HFA;VENTOLIN  HFA;PROAIR  HFA  Inhale 2 puffs every 6 (six) hours as needed for wheezing.   0     benzonatate  100 mg capsule Commonly known as: TESSALON   Take 1 capsule (100 mg total) by mouth 3 (three) times a day for 7 days.   0     doxycycline  100 mg tablet Commonly known as: VIBRA -TABS  Take 1 tablet (100 mg  total) by mouth 2 (two) times a day for 4 days. Take with 8 oz water. Do not lie down for at least 30 minutes after.   0     sennosides-docusate sodium  8.6-50 mg per tablet Commonly known as: PERICOLACE  Take 2 tablets by mouth 2 (two) times a day.   0     SUMAtriptan  50 mg tablet Commonly known as: IMITREX   Take 1 tablet (50 mg total) by mouth every 2 (two) hours as needed for migraine. May repeat dose once in 2 hours if no relief.  Do not exceed 2 doses in 24 hours.   0         Modified Medications      Sig Disp Refill Start End  gabapentin  100 mg Tab What changed: Another medication with the same name was removed. Continue taking this medication, and follow the directions you see here.  Take 100 mg by mouth 3 (three) times a day. Take along with 600mg  to make 700mg  three times daily. Start on 06/24/2024 and continue for 5 days   0         Medications To Continue      Sig Disp Refill Start End   acetaminophen  500 mg tablet Commonly known as: TYLENOL   Take 500 mg by mouth every 6 (six) hours as needed for mild pain (1-3) or fever 100.4 F or GREATER.   0     ascorbic acid  500 mg tablet Commonly known as: VITAMIN C  Take 500 mg by mouth daily.   0     atorvastatin  80 mg tablet Commonly known as: LIPITOR   Take 1 tablet by mouth daily.   0     busPIRone  15 mg tablet Commonly known as: BUSPAR   Take 15 mg by mouth 3 (three) times a day.   0     Butrans  20 mcg/hour Ptwk patch Generic drug: buprenorphine   Place 1 patch on the skin every Friday.   0     cranberry 450 mg Tab tablet Generic drug: cranberry fruit  Take 450 mg by mouth daily.   0     cyanocobalamin (vitamin B-12) 5,000 mcg Tbdl  Dissolve 1 tablet on tongue daily.   0     cyclobenzaprine 10 mg tablet Commonly known as: FLEXERIL  Take 10 mg by mouth 3 (three) times a day.   0     dextran 70-hypromellose (PF) 0.1-0.3 % ophthalmic solution  Administer 2 drops into both eyes 2 (two) times a day.   0     DULoxetine  60 mg capsule Commonly known as: CYMBALTA   Take 60 mg by mouth 2 (two) times a day.   0     ferrous sulfate  325 mg (65 mg iron) tablet  Take 325 mg by mouth 3 (three) times a day with meals.   0     fluticasone propionate 50 mcg/spray nasal spray Commonly known as: FLONASE  Administer 1 spray into each nostril daily.   0     folic acid  400 mcg tablet Commonly known as: FOLVITE   Take 400 mcg by mouth daily.   0     guaiFENesin 100 mg/5 mL syrup Commonly known as: ROBITUSSIN  Take 10 mL by mouth every 4 (four) hours as needed for cough.   0     hydroxychloroquine  200 mg tablet Commonly known as: PLAQUENIL   Take 200 mg by mouth 2 (two) times a day.   0     Linzess  145 mcg Cap capsule  Generic drug: linaCLOtide   Take 145 mcg by mouth daily.   0     loratadine 10 mg tablet Commonly known as: CLARITIN  Take 10 mg by mouth daily.   0     LORazepam  0.5 mg tablet Commonly  known as: ATIVAN   Take 0.5 mg by mouth 2 (two) times a day.   0     morphine  15 mg tablet Commonly known as: MSIR  Take 15 mg by mouth every 8 (eight) hours as needed for severe pain (7-10).   0     multivitamin with folic acid  400 mcg Tab  Take 1 tablet by mouth Once Daily.   0     pantoprazole  40 mg EC tablet Commonly known as: PROTONIX   Take 40 mg by mouth daily.   0     polyethylene glycol 17 gram packet Commonly known as: GLYCOLAX   Take 17 g by mouth daily.   0     senna 8.6 mg tablet  Take 2 tablets by mouth every 12 (twelve) hours as needed.   0     tamsulosin  0.4 mg Cap Commonly known as: FLOMAX   Take 0.4 mg by mouth daily.   0     thiamine  100 mg tablet Commonly known as: VITAMIN B1  Take 100 mg by mouth Once Daily.   0     traZODone  100 mg tablet Commonly known as: DESYREL   Take 100 mg by mouth nightly.   0         Stopped Medications    furosemide  20 mg tablet Commonly known as: LASIX        Unreviewed Medications      Sig Disp Refill Start End  mupirocin 2 % ointment Commonly known as: BACTROBAN  Twice daily x3 more days  22 g  0           Speech Therapy Recommendations: Rehab Potential Rehab Potential: Fair     Lab Results  Component Value Date/Time   HGB 8.3 (L) 07/01/2024 07:53 AM   HCT 23.9 (L) 07/01/2024 07:53 AM   WBC 4.88 07/01/2024 07:53 AM   PLT 142 (L) 07/01/2024 07:53 AM   Lab Results  Component Value Date/Time   NA 133 (L) 07/01/2024 07:53 AM   K 4.6 (H) 07/01/2024 07:53 AM   CREATININE 0.87 07/01/2024 07:53 AM   BUN 16 07/01/2024 07:53 AM   GLUCOSE 101 (H) 07/01/2024 07:53 AM    Pertinent Imaging: XR Chest 1 View  Final Result by Delmar Herbert Winfred Booker Results In [place] [id number] [phone])  EXAM:  1 VIEW XRAY OF THE CHEST  06/25/2024 03:43:23 AM    COMPARISON:  06/12/2024    CLINICAL HISTORY:  Cough.    FINDINGS:    LUNGS AND PLEURA:  No focal pulmonary opacity. No pulmonary edema. No pleural  effusion. No  pneumothorax. Lower lung volumes accentuate cardiomediastinal silhouette   and  pulmonary vascularity.    HEART AND MEDIASTINUM:  No acute abnormality of the cardiac and mediastinal silhouettes.    BONES AND SOFT TISSUES:  No acute osseous abnormality. Advanced arthritis both shoulders.    IMPRESSION:  1. No acute process.    Electronically signed by: Norman Gatlin MD 06/25/2024 03:52 AM EDT RP  Workstation: [id number]        Electronically signed by: Jennings Graciela Keeler, MD 07/01/2024 1:39 PM   Time spent on discharge: 40 minutes

## 2024-07-26 NOTE — Discharge Summary (Signed)
 ------------------------------------------------------------------------------- Attestation signed by Derryl MARLA Course, MD at 07/29/2024  3:49 PM I have  seen and evaluated the patient.  I have reviewed the note, labs, images and medical record. I have discussed  and  agree with the plan as outlined by the resident.     Electronically signed by Derryl MARLA Course, MD 07/29/24 3:49 PM  -------------------------------------------------------------------------------  HPGM3 Discharge Summary  Name: Jorge Sherman MRN: [id number] Age: 83 yrs DOB: 09/04/1941  Admit date: 07/25/2024 Discharge date and time: 07/29/2024  Admitting Physician: No admitting provider for patient encounter. Discharge Physician: Derryl MARLA Course, MD   Admission Diagnoses:  GI bleeding [K92.2] Gastrointestinal hemorrhage, unspecified gastrointestinal hemorrhage type [K92.2]   Discharge Diagnoses:  #Acute blood loss anemia #Upper GI bleeding w/ melena #Hx GAVE #Hx of Sigmoid diverticulosis #Hx of Internal hemorrhoids #Toxic-metabolic encephalopathy #Opiate-dependence #Polypharmacy #Mood disorder #Hx of intracranial small vessel disease #Pulmonary Edema, asymptomatic  Admission Condition: poor Discharged Condition: stable    Hospital Course:  For full details, please see H&P, progress notes, consult notes and ancillary notes.   Briefly, 83 years old male with PMH of CAD, aortic stenosis, rheumatoid arthritis, HTN, DM, pancytopenia, anemia, GI bleeding, diverticulosis, hemorrhoids, GAVE, opioid dependence, OSA, [name]24 with low hemoglobin and melena.   The patient's hospital course will be summarized in a problem based approach below.   #Acute blood loss anemia #Upper GI bleeding w/ melena #Hx GAVE #Hx of Sigmoid diverticulosis #Hx of Internal hemorrhoids Patient had a recent admission at [facility]  from 08/05-08/11 for acute blood loss anemia. EGD  on 08/05 showed GAVE s/p APC. He received 1uPRBC and his Hb improved from 6.7-8.3 at that time. For 2 days prior to admission, the patient has been feeling lethargic and has had melena, but denies BRBPR. Hb on admission was 5.9. Notably, takes iron at home.  On admission, he was tachypneic and hypotensive to 103/68, given 1uPRBC and IV protonix  BID. He was started on empiric rocephin  in s/o neutropenia (see below).   GI was consulted who performed an EGD on 09/05 which was unimpressive without GAVE. Suspected GIB was from small bowel AVMs, and he continued to bleed requiring an additional 1u of RBC on 09/06. CBC at discharge was 9.  GI reengaged and recommended outpatient capsule endoscopy scheduled on 09/10 in [place]. Recommended IV iron given persistent decline in Hb. In total, the patient received 3 U of pRBCs. He also received an iron transfusion. A CBC should be obtained at his pill endoscopy appt in 2 days.   #Toxic-metabolic encephalopathy #Opiate-dependence #Mood disorder #Hx of intracranial small vessel disease Patient w/ garbled speech and Aox1-2 right now. Per daughter, patient's baseline is completely normal cognition. Previous delirium with gabapentin , but has not received any. Home psych meds include trazodone  100mg , buspar , cymbalta , ativan  0.5mg  BID, lyrica  125mg  BID, morphine  15mg  q8H. Though to be 2/2 to polypharmacy. High risk of serotonin syndrome is evident. CTH (09/05) w/ [facility] Psychiatry consulted who assisted in medication management. Decreased cymbalta  to 60mg  once daily, started buprenorphine  2mg  SL rather than butrans  patch, maintained buspar  15mg  TID, and decreased ativan  to 0.25mg  BID. Psychiatry recommended further slow tapering of ativan  as well as decreasing lyrica  and trazodone .     #Chronic pancytopenia #Neutropenia #Thrombocytopenia #Hx of RA WBC of 1-2, w/ ANC 0.9 on admission. PLT 100. Appears to be chronic. Counts improved slightly through this admission  including ANC>1. Hematology was consulted who suggested low c/f malignancy and  good prognosis. Full set of hematology labs to be followed up outpatient. ID also was consulted who recommended no abx. Hematology recommended holding hydroxychloroquine . They also will arrange outpatient CBC with diff. They are considering additional liver imaging (CT w/ contrast vs MRI) to evaluate for cirrhosis to explain his pancytopenia. Filgastrim is under consideration as well.    #Pulmonary Edema, asymptomatic CT chest ordered given intermittent O2 requirement. Found pulmonary edema and RLL debris. Lasix  40mg  trialed PO with minimal output. Overall discontinued given good respiratory status. SLP found no signs of dysphagia.    On day of discharge, patient is clinically stable with no new examination findings or acute symptoms compared to prior. The patient was seen by the attending physician on the date of discharge and deemed stable and acceptable for discharge. The patient's chronic medical conditions were treated accordingly per the patient's home medication regimen. The patient's medication reconciliation, follow-up appointments, discharge orders, instructions and significant lab and diagnostic studies are as noted.    Discharge Follow-up Action Items: PCP Follow up in 1-2 weeks Hb check GI follow up out patient for capsule endoscopy (07/31/24 in [place]) Referral to cardiology for mod-severe aortic stenosis Pill endoscopy on 09/10 in [facility] Hematology follow up for pancytopenia Psychiatry follow up for polypharmacy Medication Changes  Bowel prep for pill endoscopy on 09/07 Start lidocaine  patch PRN for back HOLD hydroxychloroquine  until hematology follow up DECREASE trazodone  to 50mg  at bedtime Decrease ativan  to  Decrease lyrica  to 50mg  BID Decrease cymbalta  to 60mg  once daily  Take melatonin 3mg  nightly Decrease ativan  to 0.25mg  twice daily    Patient's Ordered Code Status: Full  Code  Consults: IP CONSULT TO HOSPITALIST IP CONSULT TO GASTROENTEROLOGY IP CONSULT TO INFECTIOUS DISEASES IP CONSULT TO HEMATOLOGY IP CONSULT TO PSYCHIATRY IP CONSULT TO SPIRITUAL CARE  Significant Diagnostic Studies:  CBC: Results from last 7 days  Lab Units 07/29/24 1500 07/29/24 0205  WHITE BLOOD CELL COUNT 10*3/uL 2.05* 1.98*  HEMOGLOBIN g/dL 9.0* 7.1*  HEMATOCRIT % 27.1* 21.4*  PLATELET COUNT 10*3/uL 83* 79*   DIFF: Results from last 7 days  Lab Units 07/29/24 0205 07/27/24 0555  NEUTROPHILS RELATIVE PERCENT % 41 56  LYMPHOCYTES RELATIVE PERCENT % 50 35  MONOCYTES RELATIVE PERCENT % 6 6  BASOPHILS RELATIVE PERCENT % 0 0  EOSINOPHILS RELATIVE PERCENT % 3 2  NEUTROPHILS ABSOLUTE COUNT 10*3/uL 0.80* 1.20*  LYMPHOCYTES ABSOLUTE COUNT 10*3/uL 1.00 0.80*  MONOCYTES ABSOLUTE COUNT 10*3/uL 0.10 0.10  BASOPHILS ABSOLUTE COUNT 10*3/uL 0.00 0.00  EOSINOPHILS ABSOLUTE COUNT 10*3/uL 0.10 0.00   CMP: Results from last 7 days  Lab Units 07/29/24 0205 07/28/24 1408 07/26/24 0206 07/25/24 1624  SODIUM mmol/L 135* 134*   < > 135*  POTASSIUM mmol/L 3.9 3.9   < > 4.2  CHLORIDE mmol/L 99 99   < > 102  CO2 mmol/L 34* 34*   < > 31  BUN mg/dL 16 12   < > 17  CREATININE mg/dL 8.85 9.06   < > 9.05  CALCIUM  mg/dL 8.6 8.7   < > 8.4*  MAGNESIUM  mg/dL 2.0 2.3   < >  --   BILIRUBIN TOTAL mg/dL  --   --   --  0.4  AST U/L  --   --   --  16  ALT U/L  --   --   --  10  TOTAL PROTEIN g/dL  --   --   --  5.9*  ALBUMIN g/dL  --   --   --  3.4*  ANION GAP mmol/L 2* 1*   < > 2*   < > = values in this interval not displayed.    COAGS: Results from last 7 days  Lab Units 07/27/24 0954  APTT seconds 36.1*  INR  1.1   CARDIAC:    Invalid input(s): TROPONINI HEMOGLOBINA1C: No results found for requested labs within last 30 days.   THYROID : Results from last 7 days  Lab Units 07/26/24 0911  TSH uIU/mL 1.809    ECHO:@ECHO7D @ IMAGING:  XR Chest 1 View  Final Result by  Karlton Marilou Ferron, MD (09/05 0902)  XR CHEST 1 VIEW, 07/25/2024 5:57 PM    INDICATION:sob   COMPARISON: Chest x-ray 06/25/2024    FINDINGS:     Supportive devices: None.  Cardiovascular/lungs/pleura: Cardiac silhouette and pulmonary vasculature   are within normal limits. Left basilar streaky opacities, favored   atelectasis versus scarring. No pleural effusion or pneumothorax.   Other: Degenerative changes.    IMPRESSION:  Bilateral lower lung streaky opacities, favoring subsegmental atelectasis   versus pulmonary scarring. If there is clinical concern consider further   evaluation with ILD protocol CT chest.          Disposition: ALF   Discharge Orders     Ambulatory referral to Cardiology     Details:    Are you referring from the ED?: No   Reason for referral: Cardiology - Adult   Comments: Mod-severe aortic stenosis   Ambulatory referral to Gastroenterology     Details:    Reason for Referral: General GI Consult   Ambulatory referral to Hematology     Ambulatory referral to PCP     Ambulatory referral to Psychiatry     Details:    Reason for Referral: Medication Management   Medication management for: Other Psychiatric Disorder (ie. Depression, Anxiety, etc.)   Comments: Polypharmarcy         Medication List     PAUSE taking these medications    hydroxychloroquine  200 mg tablet Wait to take this until your doctor or other care provider tells you to start again. HOLD UNTIL HEMATOLOGY FOLLOW UP  Commonly known as: PLAQUENIL  Take 200 mg by mouth 2 (two) times a day.       START taking these medications    lidocaine  4 % patch Commonly known as: SALONPAS Apply 1 patch topically daily.   melatonin 3 mg tablet Take 1 tablet (3 mg total) by mouth Every evening.   ondansetron  4 mg disintegrating tablet Commonly known as: ZOFRAN -ODT Dissolve 1 tablet (4 mg total) on tongue every 6 (six) hours as needed for nausea or vomiting.        CHANGE how you take these medications    DULoxetine  60 mg capsule Commonly known as: CYMBALTA  Take 1 capsule (60 mg total) by mouth daily. Start taking on: July 30, 2024 What changed: when to take this   pregabalin  50 mg capsule Commonly known as: LYRICA  Take 1 capsule (50 mg total) by mouth 2 (two) times a day. What changed:  medication strength how much to take additional instructions Another medication with the same name was removed. Continue taking this medication, and follow the directions you see here.   traZODone  50 mg tablet Commonly known as: DESYREL  Take 1 tablet (50 mg total) by mouth nightly. What changed:  medication strength how much to take       CONTINUE taking these medications    acetaminophen  500 mg tablet Commonly known as: TYLENOL   Take 2 tablets (1,000 mg total) by mouth every 8 (eight) hours as needed for mild pain (1-3) or fever 100.4 F or GREATER.   albuterol  HFA 90 mcg/actuation inhaler Commonly known as: PROVENTIL  HFA;VENTOLIN  HFA;PROAIR  HFA Inhale 2 puffs every 6 (six) hours as needed for wheezing.   ascorbic acid  500 mg tablet Commonly known as: VITAMIN C Take 500 mg by mouth daily.   atorvastatin  80 mg tablet Commonly known as: LIPITOR  Take 1 tablet by mouth daily.   busPIRone  15 mg tablet Commonly known as: BUSPAR  Take 15 mg by mouth 3 (three) times a day.   Butrans  20 mcg/hour Ptwk patch Generic drug: buprenorphine  Place 1 patch on the skin every Friday.   cranberry 450 mg Tab tablet Generic drug: cranberry fruit Take 450 mg by mouth daily.   cyanocobalamin (vitamin B-12) 5,000 mcg Tbdl Dissolve 1 tablet on tongue daily.   cyclobenzaprine 10 mg tablet Commonly known as: FLEXERIL Take 10 mg by mouth 3 (three) times a day.   dextran 70-hypromellose (PF) 0.1-0.3 % ophthalmic solution Administer 2 drops into both eyes 2 (two) times a day.   docusate sodium  100 mg capsule Commonly known as: COLACE Take 100 mg by  mouth 2 (two) times a day.   ferrous sulfate  325 mg (65 mg iron) tablet Take 325 mg by mouth 3 (three) times a day with meals.   fluticasone propionate 50 mcg/spray nasal spray Commonly known as: FLONASE Administer 1 spray into each nostril daily.   folic acid  400 mcg tablet Commonly known as: FOLVITE  Take 400 mcg by mouth daily.   guaiFENesin 100 mg/5 mL syrup Commonly known as: ROBITUSSIN Take 10 mL by mouth every 4 (four) hours as needed for cough.   Linzess  145 mcg Cap capsule Generic drug: linaCLOtide  Take 145 mcg by mouth daily.   loratadine 10 mg tablet Commonly known as: CLARITIN Take 10 mg by mouth daily.   LORazepam  0.5 mg tablet Commonly known as: ATIVAN  Take 0.5 mg by mouth 2 (two) times a day.   morphine  15 mg tablet Commonly known as: MSIR Take 15 mg by mouth every 8 (eight) hours as needed for severe pain (7-10).   multivitamin with folic acid  400 mcg Tab Take 1 tablet by mouth Once Daily.   pantoprazole  40 mg EC tablet Commonly known as: PROTONIX  Take 40 mg by mouth 2 (two) times a day.   polyethylene glycol 17 gram packet Commonly known as: GLYCOLAX  Take 17 g by mouth daily.   senna 8.6 mg tablet Take 2 tablets by mouth every 12 (twelve) hours as needed.   sennosides-docusate sodium  8.6-50 mg per tablet Commonly known as: PERICOLACE Take 2 tablets by mouth 2 (two) times a day.   SUMAtriptan  50 mg tablet Commonly known as: IMITREX  Take 1 tablet (50 mg total) by mouth every 2 (two) hours as needed for migraine. May repeat dose once in 2 hours if no relief.  Do not exceed 2 doses in 24 hours.   tamsulosin  0.4 mg Cap Commonly known as: FLOMAX  Take 0.4 mg by mouth daily.   thiamine  100 mg tablet Commonly known as: VITAMIN B1 Take 100 mg by mouth Once Daily.       STOP taking these medications    mupirocin 2 % ointment Commonly known as: BACTROBAN         Where to Get Your Medications     You can get these medications from any  pharmacy   You don't need a prescription for  these medications acetaminophen  500 mg tablet    Information about where to get these medications is not yet available   Ask your nurse or doctor about these medications DULoxetine  60 mg capsule lidocaine  4 % patch melatonin 3 mg tablet ondansetron  4 mg disintegrating tablet pregabalin  50 mg capsule traZODone  50 mg tablet        Follow-up  @PATFUTUREAPPT @ Appointments which have been scheduled for you    Sep 05, 2024 9:30 AM Hospital Follow Up Established with Rosaline Stephane Odea, PA-C [facility] - [facility]STROENTEROLOGY [place] ([facility] [place]) [address] [place] [address] [phone]  Please arrive 15 minutes prior to your scheduled appointment.          .   To request medical records from this hospitalization, please refer to [email or web address] or call [phone].   Electronically signed by:  Oliva April, MD, [facility] Internal Medicine, PGY-3 07/29/2024

## 2024-07-27 NOTE — Telephone Encounter (Signed)
 I ordered a capsule endo to be scheduled on this pt.  He is currently in hospital. Was at [facility] prior to admission.  Dr. Ladora will be reading MD.

## 2024-07-29 NOTE — Progress Notes (Signed)
 [facility] Hematology/Oncology Daily Progress Note   Chief Complaint: Symptomatic anemia due to GI bleeding, pancytopenia  Assessment/Plan  1.  Pancytopenia: ANC is 800 today.  I recommend holding hydroxychloroquine  (I put on hold).  Hemoglobin and platelets are stable today.  Abdominal ultrasound revealed slightly nodular contour to the liver with coarse echotexture and increased echogenicity, possibly due to chronic liver disease.  I think this could definitely explain his cytopenias.  In the setting of chronic liver disease, his hydroxychloroquine  could be more myelosuppressive.  If ANC remains below 1000 tomorrow, I will consider filgrastim 480 mcg x 1.  I have a low index of clinical suspicion for an underlying clonal or intrinsic marrow disorder and would not recommend a bone marrow biopsy at this time.  I reviewed his noncontrast CT from 07/27/2024 and his liver and spleen look pretty unremarkable.  We could consider CT of the abdomen with contrast to evaluate his liver versus an MRI.  I will defer to the primary team.  I held his hydroxychloroquine  today.  I have no other recommendations.  Check CBC with differential tomorrow morning.  I will continue to follow.  In total, 30 minutes of time was spent on this encounter, greater than 50% of which was spent face-to-face with Jorge Sherman for the history, physical exam, assessment and formulation of treatment plan.     Interim History:   Easily arousable this morning.  No complaints today.  Review of Systems: All other systems are negative. Current Medications[1] Objective   Vital signs in last 24 hours: Temp:  [97.4 F (36.3 C)-98.7 F (37.1 C)] 98.1 F (36.7 C) Heart Rate:  [72-97] 97 Resp:  [17-20] 18 BP: (97-131)/(54-74) 131/59 Intake/Output last 3 shifts: I/O last 3 completed shifts: In: 1730 [P.O.:1680; I.V.:50] Out: 2450 [Urine:2450]  BP (!) 131/59 (BP Location: Right arm, Patient Position:  Lying)   Pulse 97   Temp 98.1 F (36.7 C) (Oral)   Resp 18   Ht 1.778 m (5' 10)   Wt 98.6 kg (217 lb 6 oz)   SpO2 98%   BMI 31.19 kg/m  Physical Exam Constitutional:      General: He is not in acute distress. HENT:     Mouth/Throat:     Mouth: Mucous membranes are moist.     Pharynx: No oropharyngeal exudate.  Eyes:     General: No scleral icterus. Abdominal:     Palpations: There is no hepatomegaly.     Tenderness: There is no abdominal tenderness.  Neurological:     Mental Status: He is alert.     Pertinent labs, xrays and pathology reports reviewed:   Lab Results  Component Value Date   NEUTROABS 0.80 (L) 07/29/2024   NEUTROABS 1.20 (L) 07/27/2024   NEUTROABS 1.30 (L) 07/26/2024   Lab Results  Component Value Date   WBC 1.98 (L) 07/29/2024   HGB 7.1 (L) 07/29/2024   HCT 21.4 (L) 07/29/2024   PLT 79 (L) 07/29/2024   Lab Results  Component Value Date   NA 135 (L) 07/29/2024   K 3.9 07/29/2024   CL 99 07/29/2024   CO2 34 (H) 07/29/2024   BUN 16 07/29/2024   CREATININE 1.14 07/29/2024   CALCIUM  8.6 07/29/2024   MG 2.0 07/29/2024   PHOS 4.1 11/19/2021   Lab Results  Component Value Date   BILITOT 0.4 07/25/2024   PROT 5.9 (L) 07/25/2024   ALBUMIN 3.4 (L) 07/25/2024   ALT 10 07/25/2024  AST 16 07/25/2024   Lab Results  Component Value Date   INR 1.1 07/27/2024   CT Chest WO Contrast Result Date: 07/27/2024 CT CHEST WO CONTRAST, 07/27/2024 2:07 AM INDICATION: new oxygen  requirement and rec per last XR COMPARISON: Chest radiograph dated 07/25/2024. Chest CT dated 08/06/2017 TECHNIQUE: Multislice axial images were obtained through the chest without administration of iodinated intravenous contrast material. Multi-planar reformatted images were generated for additional analysis. Nongated technique limits cardiac detail. All CT scans at [facility] and [facility] are performed using radiation dose optimization techniques as  appropriate to a performed exam, including but not limited to one or more of the following: automatic exposure control, adjustment of the mA and/or kV according to patient size, use of iterative reconstruction technique. In addition, our institution participates in a radiation dose monitoring program to optimize patient radiation exposure. FINDINGS: Tubes and Lines: None Thoracic inlet/central airways: Visualized thyroid  gland is normal.  Central airway is patent.  No cervical lymphadenopathy. Mediastinum/hila/axilla: No adenopathy. Heart/vessels: Right atrial enlargement. Severe coronary artery calcification. Severe aortic valvular calcification. Mild mitral annular calcification. Aorta is normal in caliber.  Atherosclerotic calcification. Dilated main pulmonary artery, as can be seen in the setting of pulmonary hypertension. Lungs/pleura: Interlobular septal thickening. Patchy areas of groundglass attenuation and centrilobular groundglass nodularity. Linear areas of atelectasis in the lung bases. Debris within the right lower lobe bronchus Upper abdomen: Cholelithiasis. No acute process in the visualized upper abdomen. Chest wall/MSK: Severe bilateral glenohumeral osteoarthritis. Post fusion changes in the lumbar spine appreciated on scout imaging. Vertebral plasty changes in the L2 vertebral body   1.  Interlobular septal thickening and patchy areas of groundglass attenuation and centrilobular groundglass nodularity. Findings are most consistent with pulmonary edema. 2.  Debris within the right lower lobe bronchus. 3.  Severe coronary artery calcification. 4.  Severe aortic valvular calcification.   US  Abdomen Limited Result Date: 07/27/2024 US  ABDOMEN LIMITED, 07/27/2024 9:07 AM INDICATION: Rule out cirrhosis COMPARISON: None. TECHNIQUE: Multi-planar, real-time ultrasonography of the abdomen using grayscale imaging was performed, supplemented by color and/or power Doppler as needed. FINDINGS: Suboptimal  examination due to body habitus and bowel gas .  IVC: Visualized portions are patent. .  Aorta: Visualized portions are unremarkable. .  Pancreas: Not visualized. .  Liver: Length = 15 cm. Liver has a slightly nodular contour, coarse echotexture and increased echogenicity. No focal lesions within limitations of underlying liver echotexture. Visualized portions of the portal and hepatic venous systems are patent with antegrade flow. .  Gallbladder: Distended and contains calculi and sludge. No gallbladder wall thickening or pericholecystic fluid. Negative sonographic Murphy's sign. .  Biliary: CBD maximum caliber = 1 mm. No intrahepatic ductal dilatation. .  Right Kidney: Length = 11.7 cm. Normal contour and echogenicity. No hydronephrosis or perinephric fluid. No focal mass is identified. .  Peritoneum: No ascites. .  Additional comments: None.   1.  Suboptimal imaging due to body habitus and bowel gas. Liver appears to have a slightly nodular contour, coarse echotexture and increased echogenicity. This could be due to chronic liver disease. Consider CT if appropriate 2.  Gallbladder is distended and contains calculi and sludge. No sonographic evidence of acute cholecystitis.   CT Head WO Contrast Result Date: 07/26/2024 CT HEAD WITHOUT CONTRAST, 07/26/2024 1:30 PM INDICATION: AMS COMPARISON: CT head 01/22/2022 and priors TECHNIQUE: Axial CT images of the brain from skull base to vertex, including portions of the face and sinuses, were obtained without  contrast. Supplemental 2D reformatted images were generated and reviewed as needed. All CT scans at [facility] and [facility] are performed using radiation dose optimization techniques as appropriate to a performed exam, including but not limited to one or more of the following: automatic exposure control, adjustment of the mA and/or kV according to patient size, use of iterative reconstruction technique. In addition, our  institution participates in a radiation dose monitoring program to optimize patient radiation exposure. FINDINGS: Calvarium/skull base: No evidence of acute fracture or destructive lesion. Mastoids and middle ears demonstrate no substantial mucosal disease. Cerumen in the left external auditory canal. Bilateral pseudophakia. Paranasal sinuses: No air fluid levels. Scattered inflammatory mucosal thickening is seen in the paranasal sinuses. Brain: No acute large vascular territory infarct. No mass effect. No hydrocephalus. No acute hemorrhage. Generalized cerebral and cerebellar volume loss with ex vacuo enlargement of the ventricular system. Subcortical and periventricular white matter hypoattenuation is nonspecific but likely related to chronic microvascular ischemic disease. Intracranial atherosclerosis.   No acute intracranial abnormality. Although no evidence of acute infarction, mass, or hemorrhage is seen, CT is relatively insensitive for the detection of hypoxia/ischemia within the first 24-48 hours, and an MRI scan may be indicated.  Endo EGD Result Date: 07/26/2024 Table formatting from the original result was not included. Staff Role Morene Camellia Hammonds, CRNA CRNA Elveria Molt Endo Technician Barnie Montes, RN Visitor Marta JONELLE Maize, RN Endo Nurse Charlie Jayson An, DO Anesthesiologist Gunnar Chris Daniels, MD Proceduralist Indication GIB with melena Medications See Anesthesia Record. Preprocedure A history and physical has been performed, and patient medication allergies have been reviewed. The patient's tolerance of previous anesthesia has been reviewed. The risks and benefits of the procedure and the sedation options and risks were discussed with the patient. All questions were answered and informed consent obtained. Details of the Procedure The patient underwent monitored anesthesia care, which was administered by an anesthesia professional. The patient's blood pressure, ECG, ETCO2, heart rate,  level of consciousness, oxygen  and respirations were monitored throughout the procedure. The scope was introduced through the mouth and advanced to the second part of the duodenum. Retroflexion was performed in the cardia, fundus and incisura. The patient experienced no blood loss. The procedure was not difficult. The patient tolerated the procedure well. There were no apparent adverse events. Findings The esophagus appeared normal. The stomach appeared normal. Blood is not seen The duodenal bulb and 2nd part of the duodenum appeared normal. Recommendation - Supportive care - Transfuse as necessary - Further GI work up as outpatient with capsule endoscopy would be prudent Post-Op Diagnosis  - Unimpressive exam of the upper GI tract.  GAVE is not identified.  Suspect recurrent bleeding is due to small bowel avms Events Procedure Events Event Event Time UPPER SCOPE IN TIME 07/26/2024 12:25 PM UPPER SCOPE OUT TIME 07/26/2024 12:31 PM Specimens No specimens collected Referring Provider Butler Browning [facility]*   ECG 12 lead Result Date: 07/26/2024 Ventricular Rate                   90        BPM                 Atrial Rate                        90        BPM  P-R Interval                       238       ms                  QRS Duration                       100       ms                  Q-T Interval                       354       ms                  QTC Calculation Bazett             433       ms                  Calculated P Axis                  67        degrees             Calculated R Axis                  -44       degrees             Calculated T Axis                  63        degrees             Sinus rhythm First degree A-V block Left axis deviation Low voltage QRS, consider pulmonary disease or obesity When compared with ECG of 08-Jun-2024 17 56, No significant change was found Confirmed by Rojelio Dunnings  [phone]  on 07-26-2024 10 18 47 AM  XR Chest 1 View Result Date: 07/26/2024 XR CHEST 1 VIEW,  07/25/2024 5:57 PM INDICATION:sob COMPARISON: Chest x-ray 06/25/2024 FINDINGS: Supportive devices: None. Cardiovascular/lungs/pleura: Cardiac silhouette and pulmonary vasculature are within normal limits. Left basilar streaky opacities, favored atelectasis versus scarring. No pleural effusion or pneumothorax. Other: Degenerative changes.   Bilateral lower lung streaky opacities, favoring subsegmental atelectasis versus pulmonary scarring. If there is clinical concern consider further evaluation with ILD protocol CT chest.    This document was created using the aid of voice recognition Dragon dictation software, please excuse any sound alike substitutions, typographic or transcription errors.  Efforts have been made to correct these dictation errors, however some may persist, and this does not reflect the standard of medical care.  If there are any questions please do not hesitate to contact me for clarification.        [1]  Current Facility-Administered Medications:  .  acetaminophen  (TYLENOL ) tablet 1,000 mg, 1,000 mg, oral, Q6H PRN, Oliva Quintin April, MD, 1,000 mg at 07/28/24 2019 .  albuterol  sulfate 2.5 mg/0.5 mL nebulizer solution 2.5 mg, 2.5 mg, nebulization, Q6H PRN, Reta Peace, MD .  ascorbic acid  (VITAMIN C) tablet 500 mg, 500 mg, oral, Daily, Reta Peace, MD, 500 mg at 07/28/24 0920 .  atorvastatin  (LIPITOR ) tablet 80 mg, 80 mg, oral, Daily, Reta Peace, MD, 80 mg at 07/28/24 2019 .  buprenorphine  (SUBUTEX ) SL tablet 2 mg, 2 mg, sublingual, Daily, Oliva  Quintin April, MD, 2 mg at 07/28/24 1024 .  busPIRone  (BUSPAR ) tablet 15 mg, 15 mg, oral, TID, Oliva Quintin April, MD, 15 mg at 07/29/24 0601 .  calcium  carbonate (TUMS) 500 mg (200 mg calcium ) chewable tablet 500 mg, 500 mg, oral, 4x Daily PRN, Maude Mabel Hila, MD, 500 mg at 07/28/24 2258 .  dextrose  (D50W) 50 % injection 12.5 g, 12.5 g, intravenous, PRN, Reta Peace, MD .  dextrose  (GLUTOSE) 40 % oral  gel 15 g, 15 g, oral, PRN, Reta Peace, MD .  docusate sodium  (COLACE) capsule 100 mg, 100 mg, oral, BID, Bhaskar Pusuluri, MD, 100 mg at 07/28/24 2019 .  DULoxetine  (CYMBALTA ) DR capsule 60 mg, 60 mg, oral, Daily, Oliva Quintin April, MD, 60 mg at 07/28/24 9082 .  famotidine  (PEPCID ) tablet 20 mg, 20 mg, oral, BID PRN, Peter Nicholas Whitehurst, MD, 20 mg at 07/29/24 0032 .  ferrous sulfate  325 mg (65 mg iron) tablet 325 mg, 325 mg, oral, Daily before breakfast, Oliva Quintin April, MD, 325 mg at 07/29/24 0601 .  fluticasone propionate (FLONASE) 50 mcg/spray 1 spray, 1 spray, Each Nostril, Daily, Reta Peace, MD, 1 spray at 07/28/24 0921 .  folic acid  (FOLVITE ) tablet 400 mcg, 400 mcg, oral, Daily, Reta Peace, MD, 400 mcg at 07/28/24 0918 .  [Held by provider] hydroxychloroquine  (PLAQUENIL ) tablet 200 mg, 200 mg, oral, BID, Bhaskar Pusuluri, MD, 200 mg at 07/28/24 2019 .  insulin  lispro (HumaLOG) injection 0-10 Units, 0-10 Units, subcutaneous, With meals & at bedtime, Reta Peace, MD, 1 Units at 07/28/24 1237 .  loratadine (CLARITIN) tablet 10 mg, 10 mg, oral, Daily, Reta Peace, MD, 10 mg at 07/28/24 9081 .  LORazepam  (ATIVAN ) tablet 0.25 mg, 0.25 mg, oral, BID, Oliva Quintin April, MD, 0.25 mg at 07/28/24 2019 .  melatonin tablet 3 mg, 3 mg, oral, Every evening, Oliva Quintin April, MD, 3 mg at 07/28/24 2019 .  morphine  (MSIR) tablet 15 mg, 15 mg, oral, Q8H PRN, Reta Peace, MD, 15 mg at 07/28/24 2336 .  mupirocin (BACTROBAN) 2 % ointment 1 Application, 1 Application, Each Nostril, BID, Oliva Quintin April, MD, 1 Application at 07/28/24 2020 .  omeprazole (PriLOSEC) DR capsule 40 mg, 40 mg, oral, Daily 0600, Maude Mabel Hila, MD, 40 mg at 07/29/24 0032 .  ondansetron  (ZOFRAN ) injection 4 mg, 4 mg, intravenous, Q6H PRN, Reta Peace, MD .  ondansetron  (ZOFRAN -ODT) disintegrating tablet 4 mg, 4 mg, oral, Q6H PRN, Reta Peace, MD, 4 mg at 07/28/24  0917 .  polyethylene glycol (GLYCOLAX ) packet 17 g, 17 g, oral, BID, Oliva Quintin April, MD .  pregabalin  (LYRICA ) capsule 50 mg, 50 mg, oral, BID, Oliva Quintin April, MD, 50 mg at 07/28/24 2019 .  sennosides-docusate sodium  (PERICOLACE) 8.6-50 mg per tablet 2 tablet, 2 tablet, oral, BID, Reta Peace, MD, 2 tablet at 07/27/24 2117 .  tamsulosin  (FLOMAX ) 24 hr capsule 0.4 mg, 0.4 mg, oral, Daily, Reta Peace, MD, 0.4 mg at 07/28/24 9081 .  thiamine  (VITAMIN B1) tablet 100 mg, 100 mg, oral, Daily, Oliva Quintin April, MD, 100 mg at 07/28/24 0919 .  traZODone  (DESYREL ) tablet 50 mg, 50 mg, oral, Nightly, Oliva Quintin April, MD, 50 mg at 07/28/24 2019

## 2024-07-29 NOTE — Telephone Encounter (Signed)
 This has been approved and I faxed prep to chris at hosp to give to pt

## 2024-07-29 NOTE — Progress Notes (Signed)
 ------------------------------------------------------------------------------- Attestation signed by Derryl MARLA Course, MD at 07/29/2024  3:49 PM I have  seen and evaluated the patient.  I have reviewed the note, labs, images and medical record. I have discussed  and  agree with the plan as outlined by the resident.     Electronically signed by Derryl MARLA Course, MD 07/29/24 3:49 PM  -------------------------------------------------------------------------------  [id number] Progress Note  Admit Date:07/25/2024  3:55 PM Date: 07/29/2024 LOS:4 Attending: Derryl MARLA Course, MD    SUBJECTIVE     Interval History:  Mon 07/29/2024 Overnight, patient retained c/o flank pain and burning. In and O performed x2. Able to urinate independently this AM.  Also given atarax  to bridge until next ativan  dosing.  Mental status at baseline.    OBJECTIVE  Afebrile, HR 90s, MAP 70s, 93% RA Temp:  [97.4 F (36.3 C)-98.7 F (37.1 C)] 98.1 F (36.7 C) Heart Rate:  [72-97] 97 Resp:  [17-20] 18 BP: (97-131)/(54-74) 131/59  Intake/Output Summary (Last 24 hours) at 07/29/2024 9187 Last data filed at 07/29/2024 0459 Gross per 24 hour  Intake 1130 ml  Output 1550 ml  Net -420 ml    Physical Exam: General: NAD, appearing stated age HEENT: Normocephalic, Atraumatic, EOMI, MMM Extremities: WWP, no BLE  Skin: No telangiectasia, rash, or other obvious lesions  Neuro: Aox4, moving all four extremities spontaneously  Relevant Interval Labs Hb 7.1 WBC 2 ANC 0.8 PLT 79   Net neg 420 yesterday    ASSESSMENT & PLAN  83 years old male with PMH of CAD, aortic stenosis, rheumatoid arthritis, HTN, DM, pancytopenia, anemia, GI bleeding, diverticulosis, hemorrhoids, GAVE, opioid dependence, OSA, [name] with low hemoglobin and melena.  #Acute blood loss anemia #Upper GI bleeding w/ melena #Hx GAVE #Hx of Sigmoid diverticulosis #Hx of Internal  hemorrhoids - Recent admission here from 08/05-08/11 for acute blood loss anemia. EGD on 08/05 showed GAVE s/p APC. He received 1uPRBC and his Hb improved from 6.7-8.3 at that time.  - Over the past 2 days, he has been feeling lethargic and has melena, but denies BRBPR. Hb on admission was 5.9 - Notably, takes iron at home - On admission, he was tachypneic and hypotensive to 103/68, given 1uPRBC and IV protonix  - EGD (09/05) w/ unimpressive exam of upper GI tract, no GAVE> suspect recurrent bleeding is d/t small bowel AVMs - Continues to have transfusion-dependent bleeding on 09/06, stabilized on 09/07 - RUQUS showed course echotexture and increased echogencity, recommending CT - Liver w/o acute abnormalities on CT chest --- PLAN: -- GI consult, recs appreciated> plan for capsule endoscopy outpatient  -- Transfuse 1U Today  -- Transfuse iron  and d/c on PO iron -- Capsule endoscopy on 09/10 in [place]  #Toxic-metabolic encephalopathy #Opiate-dependence #Mood disorder #Hx of intracranial small vessel disease - Patient w/ garbled speech and Aox1-2 right now - Per daughter, patient's baseline is completely normal cognition  - Previous delirium with gabapentin , but has not received any - Home psych meds include trazodone  100mg , buspar , cymbalta , ativan  0.5mg  BID, lyrica  125mg  BID, morphine  15mg  q8H  - Likely 2/2 to polypharmacy vs serotonin syndrome - Folate B12 TSH ferritin WNL - CTH (09/05) w/ NAICA - New migraine on 09/07, spontaneously resolved --- PLAN: -- Per pharmacy rec, will start subutex  2mg  every day instead of butrans  patch and can uptitrate -- S/p psych consult: keep buspar  15mg , cymbalta  to 60mg  once a day, lyrica  50mg  BID, and trazodone  50mg   #Pulmonary edema #  mod-severe aortic stenosis - Patient will occasionally desat w/o subjective SOB to the 80s, placed on 2LNC intermittently per RN staff - CT chest showed findings most consistent with pulmonary edema and RLL  debris - 08/25 TTE showed EF65-70% with mod-severe aortic stenosis - SLP cleared him for regular diet - S/p lasix  trial of 40mg  PO x1 with moderate/minimal output (documented more when not on lasix  the day before) --- PLAN: - Evaluate diuretic needs daily (especially in context of transfusions)  #Chronic pancytopenia #Neutropenia #Thrombocytopenia #Hx of RA - WBC of 1-2, w/ ANC 0.9 - PLT 100 - Appears to be chronic - Hematology states that the fact that he is acutely ill combined with elevated retic is a good prognosis w/ low concern for malignancy ---- PLAN: - Inpatient hematology consult  - blood smear: pending  - HIV: neg  - Copper: pending  - Haptoglobin: low  - Hepatitis: neg  - Folate/B12: WNL/elevated  - Retic: 4.5% (H) - Inpatient ID consult for ppx recs - HOLD hydroxychloroquine  200mg  BID per heme  Chronic Conditions #T2DM: A1C 5.1, SSI #OSA not on CPAP: CTM #Hx of CAD: c/w lipitor  80mg  #Allergies: C/w claritin #BPH: C/w home flomax     FEN: Regular  DVT ppx:  none Ethics:  DNRL Problem List Items Addressed This Visit     Acute blood loss anemia   * (Principal) GI bleeding - Primary   Other Visit Diagnoses       GAVE (gastric antral vascular ectasia)       Relevant Orders   Endo EGD (Completed)     Melena       Relevant Orders   Endo EGD (Completed)         Electronically signed by:   Oliva April, MD [facility], PGY-3 07/29/2024 8:12 AM

## 2024-07-30 NOTE — Progress Notes (Signed)
    PATIENT: Zelada, Jacorion     FIN #: [id number]  MRN:  [id number] PROVIDER Name:        DERRYL POUR [facility] MD     PHYSICIAN RESPONSE: Acute pulmonary edema, not POA, now resolved Improved with treatment DOCUMENTATION CLARIFICATION:  Please render your professional opinion if you are treating and/or monitoring:  - Acute pulmonary edema, not POA, now resolved  - Other (please specify) _____________________   Signs/Symptoms: 9/7 PN- Pulmonary edema; Patient will occasionally desat w/o subjective SOB to the 80s, placed on 2 L [place] intermittently per RN staff; CT chest showed findings most consistent with pulmonary edema and RLL debris. 9/8 PN- S/p lasix  trial of 40mg  PO x 1 with moderate/minimal output (documented more when not on lasix  the day before). DC Summary- #Pulmonary Edema, asymptomatic. CT chest ordered given intermittent O2 requirement. Found pulmonary edema and RLL debris. Lasix  40mg  trialed PO with minimal output. Overall discontinued given good respiratory status. SLP found no signs of dysphagia; Referral to cardiology for mod-severe aortic stenosis Per EPIC VS review: 9/5 1554- desat to 86% on RA, RR 16-27, [place] 3-2 L. 9/6 9157- back to RA. 9/5 Chest CT- 1. Interlobular septal thickening and patchy areas of ground glass attenuation and centrilobular ground glass nodularity. Findings are most consistent with pulmonary edema. 2.Debris within the right lower lobe bronchus. 3.Severe coronary artery calcification. 4.Severe aortic valvular calcification.  Treatment/Evaluation: [place] 3-2 L, pulse ox, 40 mg PO Lasix  x 1 on 9/7, chest CT, CXR  Risk Factors: H/P- 83 years old male with PMH of CAD, aortic stenosis, rheumatoid arthritis, HTN, DM, pancytopenia, anemia, GI bleeding, diverticulosis, hemorrhoids, GAVE, opioid dependence, OSA.   If you have any questions regarding this query, please contact: Holley Ned, RN, BSN - Clinical Documentation Specialist - #  [phone] [email or web address] // also available via secure chat in [facility]      The patient's Clinical Indicators include: See above.  MRN: [id number]   Clarification initiated by: Ned Males on 07/29/2024 6:11 PM  Disclaimer: The purpose of this clarification is to ensure the accuracy and integrity of the documentation, and resultant codes, where this is conflicting, ambiguous or incomplete information, or clinical evidence for a higher degree of specificity or severity. In addition to clinical responses provided, providers are also presented with free-text and unable to determine response options.    Electronically signed by:  DERRYL POUR COURSE MD 07/30/2024 3:57 PM

## 2024-08-02 NOTE — Telephone Encounter (Signed)
 Jorge PARAS. is calling from Referring office is calling to reschedule CAPSULE ENDOSCOPY  [phone]

## 2024-08-05 NOTE — Telephone Encounter (Signed)
 Capsule on 08/20/24

## 2024-08-07 NOTE — Progress Notes (Signed)
 Otolaryngology Established Patient Note  Subjective: Mr. Jorge Sherman is a 83 y.o. male who presents for evaluation of dysphonia. Symptoms began several years ago. He struggles with vocal projection, fatigue, and breaks/cracks.  He denies throat pain or odynophagia.  Denies dysphagia.  Denies previous neck surgery or cervical trauma.  He was evaluated for the same symptoms by ENT on 02/20/2024.  Flexible fiberoptic laryngoscopy at that time documented fully mobile vocal cords bilaterally with no nodules, lesions, or ulcerations.  There were no abnormal findings in the upper aerodigestive tract.  Speech Pathology was recommended.  The patient reports that he did work with Speech Pathology (although documentation is not present in the medical record) and states that they provided no benefit.  Medical History[1] Surgical History[2] Family History[3]   Allergies[4]   ROS A complete review of systems was conducted and was negative except as stated in the HPI.   Objective: Vitals:   08/07/24 1058  BP: (!) 109/55  Pulse: 78  Temp: 97.5 F (36.4 C)   Physical Exam:   General [place]/AT. No suspicious cutaneous lesions or ulcerations. No tenderness to palpation over the frontal or maxillary sinuses.  Awake, Alert and appropriate for the exam  Eyes PERRL, no scleral icterus or conjunctival hemorrhage.  EOMI.  Ears Right ear- EAC patent, no obstructing cerumen. TM: intact, no effusion, no retraction, normal landmarks.  Left ear- EAC patent, no obstructing cerumen. TM: intact, no effusion, no retraction, normal landmarks.  Nose No external nasal deformities. Septum straight. Nasal mucosa moist. No significant turbinate hypertrophy. No masses or polyps.   Oral Pharynx Mucosa moist and clear. No masses, lesions, or ulceration noted on the lips, alveolar ridges, buccal mucosa, floor of mouth, tongue, soft/hard palate, tonsils, or posterior pharynx. Soft FOM. Mobile tongue.  Neck/Lymphatics Soft  and supple without LAD. Palpation of the salivary glands demonstrates no masses or irregularity.  Endocrine Thyroid  normal to palpation without dominant nodule or irregularity.  Cardio-vascular No cyanosis. Regular rate and rhythm.  Pulmonary Quiet respirations with symmetric chest wall movement. Lungs are clear to auscultation bilaterally   Neuro CN II-XII intact and symmetric. V1-3 intact and symmetric. Symmetric facial movement. Palate elevates symmetrically. Tongue midline   Psychiatry Appropriate affect and mood for clinic visit.  Voice Hoarse and strained with frequent breaks.    Laryngoscopy Flexible Diagnostic  Date/Time: 08/07/2024 10:45 AM  Performed by: Oliva Elsie Rily, MD Authorized by: Oliva Elsie Rily, MD   Comments:     PROCEDURE: After verbal consent was obtained, the nasal cavities were sprayed with a combination of Afrin and Xylocaine . A flexible endoscope was then passed into the nasal cavities.  NASAL CAVITIES: No nasal masses or polyps were visualized.  NASOPHARYNX: The nasopharynx was clear. Eustachian tube orifice clear and patent bilaterally.  OROPHARYNX: Tonsils were symmetric and non-obstructing. The vallecula and BOT were clear with no evidence of mass, lesion, or asymmetry.  SUPRAGLOTTIS: The epiglottis, false vocal cords, and arytenoids appeared normal.   HYPOPHARYNX: The piriform sinuses and post-cricoid regions were clear.  LARYNX/GLOTTIS: The true vocal cords were fully mobile and symmetric without nodules, ulcerations, or lesions.  Significant strain and muscle tension with phonation resulting in significant overlap of the false vocal cords and arytenoid mucosa.  The procedure was performed without complication and was well-tolerated by the patient.    Assessment:  Jorge Sherman presents today with  1. Muscle tension dysphonia    83 year old male who presents with a long history of dysphonia.  His exam  demonstrates findings suggestive of muscle tension  dysphonia as the etiology of his symptoms.  It is unclear whether he has worked with SLP.  Recommend referral to [facility] for voice therapy.  Plan:  As above.   Oliva MICAEL Rily, MD Otolaryngology - Head & Neck Surgery ENT & Audiology - [facility]   Electronically signed by: Oliva Elsie Rily, MD 08/07/2024 12:02 PM       [1] Past Medical History: Diagnosis Date  . Anemia   . Arthritis   . BPH (benign prostatic hyperplasia)   . Coronary artery disease   . Depression   . Diabetes mellitus    (CMD)   . Diabetes mellitus type II, controlled    (CMD)    patient states per Dr. Carlo Hora that he is not diabetic  . Difficulty swallowing   . GERD (gastroesophageal reflux disease)   . High cholesterol   . Hypertension   . Kidney stone    history  . MRSA (methicillin resistant staph aureus) culture positive 08/10/2018   nasal swab  . OSA (obstructive sleep apnea)    does not use CPAP  [2] Past Surgical History: Procedure Laterality Date  . BACK SURGERY     Procedure: BACK SURGERY; 3x's, pt unsure of type   . BACK SURGERY     Procedure: BACK SURGERY  . CARDIAC CATHETERIZATION     Procedure: CARDIAC CATHETERIZATION  . OTHER SURGICAL HISTORY     Procedure: OTHER SURGICAL HISTORY (pain stimulator ); Implanted 2009  . POSTERIOR LAMINECTOMY / DECOMPRESSION LUMBAR SPINE N/A 08/14/2018   Procedure: L2-S1 Revision Laminectomy And Fusion, Removal of Hardware and Exploration of Fusion, Allograft;  Surgeon: Royden Elsie Schneider, MD;  Location: [facility] MAIN OR;  Service: Orthopedics;  Laterality: N/A;  Prone, Stim Neuromonitoring, O-Arm, [employer], Cell Saver, Pro[employer], [employer], Justin, PACS on [employer]  [3] Family History Problem Relation Name Age of Onset  . Cancer Father    [4] Allergies Allergen Reactions  . Cefepime Rash and Other (See Comments)  . Iodine Hives  . Iohexol  Hives     Desc: A FEW HIVES POST CT, IODINE  . Tositumomab   . Fentanyl  GI  Intolerance and Itching    Patient states that it happened over 30 yrs ago  . Iodine Rash

## 2024-08-08 ENCOUNTER — Inpatient Hospital Stay (HOSPITAL_COMMUNITY)
Admission: EM | Admit: 2024-08-08 | Discharge: 2024-08-19 | DRG: 809 | Disposition: A | Discharge status: Skilled Nursing Facility

## 2024-08-08 ENCOUNTER — Other Ambulatory Visit: Payer: Self-pay

## 2024-08-08 ENCOUNTER — Encounter (HOSPITAL_COMMUNITY): Payer: Self-pay | Admitting: Family Medicine

## 2024-08-08 DIAGNOSIS — F419 Anxiety disorder, unspecified: Secondary | ICD-10-CM | POA: Diagnosis present

## 2024-08-08 DIAGNOSIS — K8020 Calculus of gallbladder without cholecystitis without obstruction: Secondary | ICD-10-CM | POA: Diagnosis present

## 2024-08-08 DIAGNOSIS — I350 Nonrheumatic aortic (valve) stenosis: Secondary | ICD-10-CM

## 2024-08-08 DIAGNOSIS — Z809 Family history of malignant neoplasm, unspecified: Secondary | ICD-10-CM

## 2024-08-08 DIAGNOSIS — I2510 Atherosclerotic heart disease of native coronary artery without angina pectoris: Secondary | ICD-10-CM | POA: Diagnosis present

## 2024-08-08 DIAGNOSIS — I10 Essential (primary) hypertension: Secondary | ICD-10-CM | POA: Diagnosis present

## 2024-08-08 DIAGNOSIS — D649 Anemia, unspecified: Principal | ICD-10-CM | POA: Diagnosis present

## 2024-08-08 DIAGNOSIS — Z881 Allergy status to other antibiotic agents status: Secondary | ICD-10-CM

## 2024-08-08 DIAGNOSIS — D61818 Other pancytopenia: Principal | ICD-10-CM | POA: Diagnosis present

## 2024-08-08 DIAGNOSIS — Z87891 Personal history of nicotine dependence: Secondary | ICD-10-CM

## 2024-08-08 DIAGNOSIS — R531 Weakness: Secondary | ICD-10-CM | POA: Diagnosis not present

## 2024-08-08 DIAGNOSIS — Z91041 Radiographic dye allergy status: Secondary | ICD-10-CM

## 2024-08-08 DIAGNOSIS — Z635 Disruption of family by separation and divorce: Secondary | ICD-10-CM

## 2024-08-08 DIAGNOSIS — K5520 Angiodysplasia of colon without hemorrhage: Secondary | ICD-10-CM | POA: Diagnosis present

## 2024-08-08 DIAGNOSIS — E119 Type 2 diabetes mellitus without complications: Secondary | ICD-10-CM | POA: Diagnosis present

## 2024-08-08 DIAGNOSIS — R490 Dysphonia: Secondary | ICD-10-CM | POA: Diagnosis present

## 2024-08-08 DIAGNOSIS — F32A Depression, unspecified: Secondary | ICD-10-CM | POA: Diagnosis present

## 2024-08-08 DIAGNOSIS — Z885 Allergy status to narcotic agent status: Secondary | ICD-10-CM

## 2024-08-08 DIAGNOSIS — Z23 Encounter for immunization: Secondary | ICD-10-CM

## 2024-08-08 DIAGNOSIS — K769 Liver disease, unspecified: Secondary | ICD-10-CM | POA: Diagnosis present

## 2024-08-08 DIAGNOSIS — Z79899 Other long term (current) drug therapy: Secondary | ICD-10-CM

## 2024-08-08 DIAGNOSIS — N400 Enlarged prostate without lower urinary tract symptoms: Secondary | ICD-10-CM | POA: Diagnosis present

## 2024-08-08 DIAGNOSIS — F1120 Opioid dependence, uncomplicated: Secondary | ICD-10-CM | POA: Diagnosis present

## 2024-08-08 DIAGNOSIS — G4733 Obstructive sleep apnea (adult) (pediatric): Secondary | ICD-10-CM | POA: Diagnosis present

## 2024-08-08 DIAGNOSIS — Z833 Family history of diabetes mellitus: Secondary | ICD-10-CM

## 2024-08-08 DIAGNOSIS — M069 Rheumatoid arthritis, unspecified: Secondary | ICD-10-CM | POA: Diagnosis present

## 2024-08-08 DIAGNOSIS — Z8249 Family history of ischemic heart disease and other diseases of the circulatory system: Secondary | ICD-10-CM

## 2024-08-08 DIAGNOSIS — G894 Chronic pain syndrome: Secondary | ICD-10-CM | POA: Diagnosis present

## 2024-08-08 DIAGNOSIS — R251 Tremor, unspecified: Secondary | ICD-10-CM | POA: Diagnosis present

## 2024-08-08 DIAGNOSIS — M810 Age-related osteoporosis without current pathological fracture: Secondary | ICD-10-CM | POA: Diagnosis present

## 2024-08-08 DIAGNOSIS — D469 Myelodysplastic syndrome, unspecified: Secondary | ICD-10-CM | POA: Diagnosis present

## 2024-08-08 DIAGNOSIS — Z823 Family history of stroke: Secondary | ICD-10-CM

## 2024-08-08 LAB — CBC WITH DIFFERENTIAL/PLATELET
Abs Immature Granulocytes: 0.01 K/uL (ref 0.00–0.07)
Basophils Absolute: 0 K/uL (ref 0.0–0.1)
Basophils Relative: 1 %
Eosinophils Absolute: 0 K/uL (ref 0.0–0.5)
Eosinophils Relative: 2 %
HCT: 23.1 % — ABNORMAL LOW (ref 39.0–52.0)
Hemoglobin: 7.1 g/dL — ABNORMAL LOW (ref 13.0–17.0)
Immature Granulocytes: 1 %
Lymphocytes Relative: 43 %
Lymphs Abs: 0.8 K/uL (ref 0.7–4.0)
MCH: 28.4 pg (ref 26.0–34.0)
MCHC: 30.7 g/dL (ref 30.0–36.0)
MCV: 92.4 fL (ref 80.0–100.0)
Monocytes Absolute: 0.1 K/uL (ref 0.1–1.0)
Monocytes Relative: 5 %
Neutro Abs: 1 K/uL — ABNORMAL LOW (ref 1.7–7.7)
Neutrophils Relative %: 48 %
Platelets: 113 K/uL — ABNORMAL LOW (ref 150–400)
RBC: 2.5 MIL/uL — ABNORMAL LOW (ref 4.22–5.81)
RDW: 15.6 % — ABNORMAL HIGH (ref 11.5–15.5)
Smear Review: NORMAL
WBC: 1.9 K/uL — ABNORMAL LOW (ref 4.0–10.5)
nRBC: 0 % (ref 0.0–0.2)

## 2024-08-08 LAB — COMPREHENSIVE METABOLIC PANEL WITH GFR
ALT: 12 U/L (ref 0–44)
AST: 18 U/L (ref 15–41)
Albumin: 3.4 g/dL — ABNORMAL LOW (ref 3.5–5.0)
Alkaline Phosphatase: 66 U/L (ref 38–126)
Anion gap: 8 (ref 5–15)
BUN: 24 mg/dL — ABNORMAL HIGH (ref 8–23)
CO2: 26 mmol/L (ref 22–32)
Calcium: 8.7 mg/dL — ABNORMAL LOW (ref 8.9–10.3)
Chloride: 100 mmol/L (ref 98–111)
Creatinine, Ser: 1.14 mg/dL (ref 0.61–1.24)
GFR, Estimated: >60 mL/min (ref >60)
Glucose, Bld: 136 mg/dL — ABNORMAL HIGH (ref 70–99)
Potassium: 4.5 mmol/L (ref 3.5–5.1)
Sodium: 134 mmol/L — ABNORMAL LOW (ref 135–145)
Total Bilirubin: 0.7 mg/dL (ref 0.0–1.2)
Total Protein: 6.5 g/dL (ref 6.5–8.1)

## 2024-08-08 LAB — ABO/RH: ABO/RH(D): A POS

## 2024-08-08 LAB — LIPASE, BLOOD: Lipase: 18 U/L (ref 11–51)

## 2024-08-08 LAB — PREPARE RBC (CROSSMATCH)

## 2024-08-08 MED ORDER — PREGABALIN 25 MG PO CAPS
50.0000 mg | ORAL_CAPSULE | Freq: Two times a day (BID) | ORAL | Status: DC
  Administered 2024-08-08 – 2024-08-19 (×22): 50 mg via ORAL
  Filled 2024-08-08 (×22): qty 2

## 2024-08-08 MED ORDER — LINACLOTIDE 145 MCG PO CAPS
145.0000 ug | ORAL_CAPSULE | Freq: Every day | ORAL | Status: DC
  Administered 2024-08-09 – 2024-08-19 (×11): 145 ug via ORAL
  Filled 2024-08-08 (×11): qty 1

## 2024-08-08 MED ORDER — PREGABALIN 25 MG PO CAPS: 125.0000 mg | ORAL_CAPSULE | Freq: Two times a day (BID) | ORAL | Status: DC

## 2024-08-08 MED ORDER — BUPRENORPHINE 10 MCG/HR TD PTWK
2.0000 | MEDICATED_PATCH | TRANSDERMAL | Status: DC
  Administered 2024-08-09 – 2024-08-16 (×2): 2 via TRANSDERMAL
  Filled 2024-08-08 (×2): qty 2

## 2024-08-08 MED ORDER — ATORVASTATIN CALCIUM 80 MG PO TABS
80.0000 mg | ORAL_TABLET | Freq: Every day | ORAL | Status: DC
  Administered 2024-08-08 – 2024-08-19 (×12): 80 mg via ORAL
  Filled 2024-08-08 (×12): qty 1

## 2024-08-08 MED ORDER — MORPHINE SULFATE 15 MG PO TABS
15.0000 mg | ORAL_TABLET | Freq: Three times a day (TID) | ORAL | Status: DC | PRN
  Administered 2024-08-08 – 2024-08-19 (×23): 15 mg via ORAL
  Filled 2024-08-08 (×24): qty 1

## 2024-08-08 MED ORDER — FERROUS SULFATE 325 (65 FE) MG PO TABS
325.0000 mg | ORAL_TABLET | ORAL | Status: DC
  Administered 2024-08-08 – 2024-08-18 (×6): 325 mg via ORAL
  Filled 2024-08-08 (×6): qty 1

## 2024-08-08 MED ORDER — PANTOPRAZOLE SODIUM 40 MG IV SOLR
40.0000 mg | Freq: Two times a day (BID) | INTRAVENOUS | Status: DC
  Administered 2024-08-08 – 2024-08-15 (×14): 40 mg via INTRAVENOUS
  Filled 2024-08-08 (×16): qty 10

## 2024-08-08 MED ORDER — LORAZEPAM 0.5 MG PO TABS
0.2500 mg | ORAL_TABLET | Freq: Two times a day (BID) | ORAL | Status: DC
  Administered 2024-08-08 – 2024-08-19 (×22): 0.25 mg via ORAL
  Filled 2024-08-08 (×22): qty 1

## 2024-08-08 MED ORDER — BUPRENORPHINE HCL 2 MG SL SUBL: 2.0000 mg | SUBLINGUAL_TABLET | Freq: Every day | SUBLINGUAL | Status: DC

## 2024-08-08 MED ORDER — DULOXETINE HCL 60 MG PO CPEP
60.0000 mg | ORAL_CAPSULE | Freq: Every day | ORAL | Status: DC
  Administered 2024-08-08 – 2024-08-19 (×12): 60 mg via ORAL
  Filled 2024-08-08 (×12): qty 1

## 2024-08-08 MED ORDER — TAMSULOSIN HCL 0.4 MG PO CAPS
0.4000 mg | ORAL_CAPSULE | Freq: Every day | ORAL | Status: DC
  Administered 2024-08-09 – 2024-08-18 (×10): 0.4 mg via ORAL
  Filled 2024-08-08 (×10): qty 1

## 2024-08-08 MED ORDER — SODIUM CHLORIDE 0.9% IV SOLUTION: Freq: Once | INTRAVENOUS | Status: AC

## 2024-08-08 MED ORDER — TRAZODONE HCL 50 MG PO TABS
50.0000 mg | ORAL_TABLET | Freq: Every day | ORAL | Status: DC
  Administered 2024-08-08 – 2024-08-18 (×11): 50 mg via ORAL
  Filled 2024-08-08 (×11): qty 1

## 2024-08-08 MED ORDER — BUSPIRONE HCL 5 MG PO TABS
15.0000 mg | ORAL_TABLET | Freq: Three times a day (TID) | ORAL | Status: DC
Start: 2024-08-08 — End: 2024-08-19
  Administered 2024-08-08 – 2024-08-19 (×32): 15 mg via ORAL
  Filled 2024-08-08 (×32): qty 1

## 2024-08-08 NOTE — H&P (Signed)
 History and Physical    Jorge Sherman DOB: February 14, 1941 DOA: 08/08/2024  PCP: [name] LABOR, MD  Patient coming from: SNF  I have personally briefly reviewed patient's old medical records in [facility]  Chief Complaint: anemia, lightheadedness, weakness  HPI: Jorge Sherman is Jorge Sherman 83 y.o. male with medical history significant of recent admissions due to GI bleeding, anemia, chronic pain, depression/anxiety, T2DM and multiple other medical issues here with complaints of dizziness and labs concerning for Hb 6.8.  He notes generalized weakness and dizziness as well as SOB with exertion.  Denies noticing any recent GI bleeding.  His history is notable for recent admissions to [facility].  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  Had repeat EGD on 9/5 which was unremarkable.  There was Tariq Pernell plan for an outpatient capsule endoscopy, this is now scheduled for 9/30.  He denies CP.  Notes nausea, but no vomiting.  No smoking or drinking.  His hb was noted to be 6.8 at [name] and he was brought to the ED.  ED Course: labs.  1 unit prbc ordered.  Hospitalist to admit for symptomatic anemia/GI bleed.  GI consulted.   Review of Systems: As per HPI otherwise all other systems reviewed and are negative.  Past Medical History:  Diagnosis Date   Chronic pain    Depressive disorder, not elsewhere classified    Dizziness and giddiness    Essential and other specified forms of tremor    Headache(784.0)    Leukocytosis, unspecified    Obstructive sleep apnea (adult) (pediatric)    Pain in joint, lower leg    Septic joint of left knee joint (HCC) 04/22/2019   Spinal stenosis, lumbar region, without neurogenic claudication    Thoracic or lumbosacral neuritis or radiculitis, unspecified    Type II or unspecified type diabetes mellitus without mention of complication, not stated as uncontrolled    Unspecified cardiovascular disease    Unspecified essential hypertension     Past  Surgical History:  Procedure Laterality Date   BACK SURGERY     back surgey     times 3   IR INJECT/THERA/INC NEEDLE/CATH/PLC EPI/LUMB/SAC W/IMG  11/26/2020    Social History  reports that he has quit smoking. He has never used smokeless tobacco. He reports that he does not drink alcohol  and does not use drugs.  Allergies  Allergen Reactions   Cefepime Rash    Redness   Iohexol  Hives and Other (See Comments)    Rainn Bullinger few hives post CT   Fentanyl  Itching, Nausea And Vomiting and Other (See Comments)    Patient states that it happened over 30 yrs ago   Iodine Hives and Rash    Family History  Problem Relation Age of Onset   Cancer Father    Heart disease Father    Stroke Mother    Diabetes Mother    Prior to Admission medications   Medication Sig Start Date End Date Taking? Authorizing Provider  acetaminophen  (TYLENOL ) 500 MG tablet Take 2 tablets (1,000 mg total) by mouth every 6 (six) hours as needed for headache, mild pain or fever. 11/28/20   Patsy Lenis, MD  albuterol  (VENTOLIN  HFA) 108 (90 Base) MCG/ACT inhaler Inhale 2 puffs into the lungs every 4 (four) hours as needed for wheezing or shortness of breath. 11/03/20   Pokhrel, Laxman, MD  amLODipine  (NORVASC ) 10 MG tablet Take 1 tablet (10 mg total) by mouth daily. 11/29/20   Girguis,  Alm, MD  amoxicillin -clavulanate (AUGMENTIN ) 500-125 MG tablet Take 1 tablet (500 mg total) by mouth every 8 (eight) hours. 10/04/21   Geroldine Berg, MD  atorvastatin  (LIPITOR ) 80 MG tablet Take 80 mg by mouth daily. 12/14/20   [provider]  celecoxib  (CELEBREX ) 200 MG capsule Take 200 mg by mouth daily.    [provider]  cloNIDine  (CATAPRES ) 0.1 MG tablet Take 0.1 mg by mouth 2 (two) times daily.     [provider]  diclofenac  Sodium (VOLTAREN ) 1 % GEL Apply 2 g topically 4 (four) times daily. 04/29/21   Couture, Cortni S, PA-C  doxycycline  (VIBRAMYCIN ) 100 MG capsule Take 1 capsule (100 mg total) by mouth 2 (two)  times daily. One po bid x 7 days 10/01/21   Floyd, Dan, DO  DULoxetine  (CYMBALTA ) 30 MG capsule Take 1 capsule (30 mg total) by mouth 2 (two) times daily. 02/24/21   Cheryle Page, MD  ferrous sulfate  325 (65 FE) MG tablet Take 325 mg by mouth daily.     [provider]  folic acid  (FOLVITE ) 1 MG tablet Take 1 tablet (1 mg total) by mouth daily. 02/25/21   Cheryle Page, MD  furosemide  (LASIX ) 20 MG tablet Take 1 tablet (20 mg total) by mouth daily for 4 days. 09/26/21 09/30/21  Schuyler Charlie RAMAN, MD  gabapentin  (NEURONTIN ) 300 MG capsule Take 1 capsule (300 mg total) by mouth 3 (three) times daily. Patient taking differently: Take 600 mg by mouth 3 (three) times daily. (Take with 100mg  to equal Kenyette Gundy total dose of 700mg .) 11/28/20   Patsy Alm, MD  hydroxychloroquine  (PLAQUENIL ) 200 MG tablet Take 200 mg by mouth 2 (two) times daily.    [provider]  lidocaine  (LIDODERM ) 5 % Place 1 patch onto the skin daily. Remove & Discard patch within 12 hours or as directed by MD 09/23/21   Alva Larraine FALCON, PA-C  linaclotide  (LINZESS ) 72 MCG capsule Take 72 mcg by mouth daily as needed (constipation).    [provider]  loperamide  (IMODIUM  Jorge Sherman) 2 MG tablet Take 1 tablet (2 mg total) by mouth 4 (four) times daily as needed for diarrhea or loose stools. 07/04/20   Will Almarie MATSU, MD  LORazepam  (ATIVAN ) 1 MG tablet Take 0.5 tablets (0.5 mg total) by mouth every 8 (eight) hours as needed for anxiety. 02/24/21   Cheryle Page, MD  Menthol , Topical Analgesic, (BIOFREEZE EX) Apply 1 application topically daily. To back of neck and lower back.    [provider]  methocarbamol  (ROBAXIN ) 500 MG tablet Take 1 tablet (500 mg total) by mouth 2 (two) times daily as needed for muscle spasms (hold for sedation). 02/24/21   Cheryle Page, MD  methocarbamol  (ROBAXIN ) 500 MG tablet Take 1 tablet (500 mg total) by mouth 2 (two) times daily. 05/04/21   Jorge Faden, MD  Multiple Vitamin  (MULTIVITAMIN WITH MINERALS) TABS tablet Take 1 tablet by mouth daily. 11/29/20   Patsy Alm, MD  ondansetron  (ZOFRAN ) 4 MG tablet Take 1 tablet (4 mg total) by mouth every 6 (six) hours as needed for nausea. 07/04/20   Will Almarie MATSU, MD  oxyCODONE -acetaminophen  (PERCOCET/ROXICET) 5-325 MG tablet Take 1 tablet by mouth every 6 (six) hours as needed for severe pain. 05/10/21   Towana Ozell BROCKS, MD  pantoprazole  (PROTONIX ) 40 MG tablet Take 1 tablet (40 mg total) by mouth daily. 11/29/20   Patsy Alm, MD  polyethylene glycol (MIRALAX  / GLYCOLAX ) 17 g packet Take 17 g by  mouth daily as needed for mild constipation. 11/03/20   Pokhrel, Laxman, MD  predniSONE  (DELTASONE ) 20 MG tablet Take 2 tablets (40 mg total) by mouth daily with breakfast. For the next four days 06/03/21   Garrick Charleston, MD  rosuvastatin  (CRESTOR ) 40 MG tablet Take 40 mg by mouth every evening.     [provider]  SUMAtriptan  (IMITREX ) 25 MG tablet Take 25 mg by mouth daily as needed for migraine. May repeat in 2 hours if headache persists or recurs.    [provider]  tamsulosin  (FLOMAX ) 0.4 MG CAPS capsule Take 0.4 mg by mouth daily.    [provider]  thiamine  100 MG tablet Take 1 tablet (100 mg total) by mouth daily. 02/25/21   Cheryle Page, MD    Physical Exam: Vitals:   08/08/24 1500 08/08/24 1743 08/08/24 1847 08/08/24 2007  BP: 116/62   (!) 105/53  Pulse: 88   86  Resp:    18  Temp: 98.6 F (37 C)   98.4 F (36.9 C)  TempSrc: Oral   Oral  SpO2: 100%   98%  Weight:  107 kg 97.3 kg   Height:  5' 11 (1.803 m) 5' 10 (1.778 m)     Constitutional: NAD, calm, comfortable Vitals:   08/08/24 1500 08/08/24 1743 08/08/24 1847 08/08/24 2007  BP: 116/62   (!) 105/53  Pulse: 88   86  Resp:    18  Temp: 98.6 F (37 C)   98.4 F (36.9 C)  TempSrc: Oral   Oral  SpO2: 100%   98%  Weight:  107 kg 97.3 kg   Height:  5' 11 (1.803 m) 5' 10 (1.778 m)    Eyes: PERRL, lids and  conjunctivae normal ENMT: Mucous membranes are moist. Neck: normal, supple Respiratory: unlabored Cardiovascular: Regular rate and rhythm, no murmurs / rubs / gallops. No extremity edema. Abdomen: no tenderness, no masses palpated.  Musculoskeletal: no clubbing / cyanosis. No joint deformity upper and lower extremities. Good ROM, no contractures. Normal muscle tone.  Skin: no rashes, lesions, ulcers. No induration Neurologic: Alert, moving all extremities Psychiatric: Normal judgment and insight. Alert and oriented x 3. Normal mood.   Labs on Admission: I have personally reviewed following labs and imaging studies  CBC: Recent Labs  Lab 08/08/24 1505  WBC 1.9*  NEUTROABS 1.0*  HGB 7.1*  HCT 23.1*  MCV 92.4  PLT 113*    Basic Metabolic Panel: Recent Labs  Lab 08/08/24 1505  NA 134*  K 4.5  CL 100  CO2 26  GLUCOSE 136*  BUN 24*  CREATININE 1.14  CALCIUM  8.7*    GFR: Estimated Creatinine Clearance: 57.4 mL/min (by C-G formula based on SCr of 1.14 mg/dL).  Liver Function Tests: Recent Labs  Lab 08/08/24 1505  AST 18  ALT 12  ALKPHOS 66  BILITOT 0.7  PROT 6.5  ALBUMIN 3.4*    Urine analysis:    Component Value Date/Time   COLORURINE AMBER (Jorge Sherman) 02/14/2021 1220   APPEARANCEUR CLOUDY (Jorge Sherman) 02/14/2021 1220   LABSPEC 1.021 02/14/2021 1220   PHURINE 5.0 02/14/2021 1220   GLUCOSEU NEGATIVE 02/14/2021 1220   HGBUR NEGATIVE 02/14/2021 1220   BILIRUBINUR NEGATIVE 02/14/2021 1220   KETONESUR 5 (Jorge Sherman) 02/14/2021 1220   PROTEINUR 100 (Jorge Sherman) 02/14/2021 1220   NITRITE NEGATIVE 02/14/2021 1220   LEUKOCYTESUR SMALL (Jorge Sherman) 02/14/2021 1220    Radiological Exams on Admission: No results found.  EKG: Independently reviewed. none  Assessment/Plan Principal Problem:  Symptomatic anemia    Assessment and Plan:  Symptomatic Anemia Recently discharged after admission for acute blood loss anemia with upper GI bleed with melena.  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  EDG 9/5 showed  normal esophagus and stomach as well as the duodenal bulb and 2nd part of duodenum.  Small bowel AVM's were suspected based on recent discharge summary.  GI recommended an outpatient capsule endoscopy on 9/10 (looks like he hasn't been able to get this). At discharge 9/8 Hb 9 On admission today, Hb is 7.1 and he c/o DOE, generalized weakness, dizziness  Transfuse 1 unit BID PPI for now  GI consulted, they'll see in AM   Pancytopenia New as of 2025 it appears Seen by hematology who recommended holding hydroxychloroquine .  Chronic liver disease thought Jorge Sherman possible etiology. Follow CBC with diff ANC 1.0   Concern for Chronic Liver Disease US  with nodular contour, coarse echotexture, and increased echogenicity - possible chronic liver disease Follow INR  Chronic Pain  Continue cymbalta , lyrica , morphine  Will hold muscle relaxer for now Was on butrans  patch  It looks like they adjusted his pain medicines, but he was discharged on 60 cymbalta , 50 mg lyrica , morphine  15 mg q8 prn, butran 20 mcg/hr patch weekly   Anxiety  Depression Buspar  cymbalta  Ativan    History of RA Holding hydroxychloroquine    BPH  Flomax    Hx DM Recent A1c 01/2021 was 5.4 A1c likely unreliable with his anemia Follow fasting BG's  Moderate to Severe AS Follow with cards outpatient  Muscle Tension Dysphonia Seen by ENT 9/17 Follow outpatient with ENT/SLP (recommended [facility] follow up for voice therapy)  Meds obtained via recent discharge summary - med rec is pending (he and his daughter didn't know meds)     DVT prophylaxis: SCD  Code Status:   full  Family Communication:  daughter  Disposition Plan:   Patient is from:  SNF  Anticipated DC to:  SNF  Anticipated DC date:  Pending GI workup, stability  Anticipated DC barriers: Pending improvement in anemia  Consults called:  GI  Admission status:  obs   Severity of Illness: The appropriate patient status for this patient is OBSERVATION.  Observation status is judged to be reasonable and necessary in order to provide the required intensity of service to ensure the patient's safety. The patient's presenting symptoms, physical exam findings, and initial radiographic and laboratory data in the context of their medical condition is felt to place them at decreased risk for further clinical deterioration. Furthermore, it is anticipated that the patient will be medically stable for discharge from the hospital within 2 midnights of admission.     Jorge Monte MD [facility]  How to contact the [facility] Attending or Consulting provider 7A - 7P or covering provider during after hours 7P -7A, for this patient?   Check the care team in [facility] and look for Jorge Sherman) attending/consulting [facility] provider listed and b) the [facility] team listed Log into [email or web address] and use Jorge Sherman's universal password to access. If you do not have the password, please contact the hospital operator. Locate the [facility] provider you are looking for under [facility] and page to Jorge Sherman number that you can be directly reached. If you still have difficulty reaching the provider, please page the [facility] (Director on Call) for the Hospitalists listed on amion for assistance.  08/08/2024, 8:34 PM

## 2024-08-08 NOTE — ED Triage Notes (Signed)
 Pt. Brought in via [facility] from [name] with c/o of critical labs: HgB: 6.8 ans WBC: 1.6; VSS per [facility] and pt. Has some dizziness

## 2024-08-08 NOTE — ED Provider Notes (Signed)
 [place] EMERGENCY DEPARTMENT AT [facility] Provider Note   CSN: [id number] Arrival date & time: 08/08/24  1449     Patient presents with: Abnormal Lab (Hgb: 6.8/WBC: 1.6)   Jorge Sherman is a 83 y.o. male.   HPI   83 year old male with recent GI bleed thought to be secondary to esophageal/small bowel AVMs requiring transfusion presents to the emergency department with concern for anemia.  Patient reportedly had outpatient blood work done that showed anemia with hemoglobin in the 6 range.  Reported normal is 8-9.  Patient is endorsing generalized fatigue, dizziness.  He did notice an episode of dark blood per rectum but denies any ongoing bleeding.  No abdominal pain.  Is not on anticoagulation.  Prior to Admission medications   Medication Sig Start Date End Date Taking? Authorizing Provider  acetaminophen  (TYLENOL ) 500 MG tablet Take 2 tablets (1,000 mg total) by mouth every 6 (six) hours as needed for headache, mild pain or fever. 11/28/20   Patsy Lenis, MD  albuterol  (VENTOLIN  HFA) [phone] Base) MCG/ACT inhaler Inhale 2 puffs into the lungs every 4 (four) hours as needed for wheezing or shortness of breath. 11/03/20   Pokhrel, Laxman, MD  amLODipine  (NORVASC ) 10 MG tablet Take 1 tablet (10 mg total) by mouth daily. 11/29/20   Patsy Lenis, MD  amoxicillin -clavulanate (AUGMENTIN ) 500-125 MG tablet Take 1 tablet (500 mg total) by mouth every 8 (eight) hours. 10/04/21   Geroldine Berg, MD  atorvastatin  (LIPITOR ) 80 MG tablet Take 80 mg by mouth daily. 12/14/20   [provider]  celecoxib  (CELEBREX ) 200 MG capsule Take 200 mg by mouth daily.    [provider]  cloNIDine  (CATAPRES ) 0.1 MG tablet Take 0.1 mg by mouth 2 (two) times daily.     [provider]  diclofenac  Sodium (VOLTAREN ) 1 % GEL Apply 2 g topically 4 (four) times daily. 04/29/21   Couture, Cortni S, PA-C  doxycycline  (VIBRAMYCIN ) 100 MG capsule Take 1 capsule (100 mg total) by mouth 2  (two) times daily. One po bid x 7 days 10/01/21   Emil Share, DO  DULoxetine  (CYMBALTA ) 30 MG capsule Take 1 capsule (30 mg total) by mouth 2 (two) times daily. 02/24/21   Cheryle Page, MD  ferrous sulfate  325 (65 FE) MG tablet Take 325 mg by mouth daily.     [provider]  folic acid  (FOLVITE ) 1 MG tablet Take 1 tablet (1 mg total) by mouth daily. 02/25/21   Cheryle Page, MD  furosemide  (LASIX ) 20 MG tablet Take 1 tablet (20 mg total) by mouth daily for 4 days. 09/26/21 09/30/21  Schuyler Charlie RAMAN, MD  gabapentin  (NEURONTIN ) 300 MG capsule Take 1 capsule (300 mg total) by mouth 3 (three) times daily. Patient taking differently: Take 600 mg by mouth 3 (three) times daily. (Take with 100mg  to equal a total dose of 700mg .) 11/28/20   Patsy Lenis, MD  hydroxychloroquine  (PLAQUENIL ) 200 MG tablet Take 200 mg by mouth 2 (two) times daily.    [provider]  lidocaine  (LIDODERM ) 5 % Place 1 patch onto the skin daily. Remove & Discard patch within 12 hours or as directed by MD 09/23/21   Alva Larraine JULIANNA, PA-C  linaclotide  (LINZESS ) 72 MCG capsule Take 72 mcg by mouth daily as needed (constipation).    [provider]  loperamide  (IMODIUM  A-D) 2 MG tablet Take 1 tablet (2 mg total) by mouth 4 (four) times daily as needed for diarrhea or loose  stools. 07/04/20   Will Almarie MATSU, MD  LORazepam  (ATIVAN ) 1 MG tablet Take 0.5 tablets (0.5 mg total) by mouth every 8 (eight) hours as needed for anxiety. 02/24/21   Cheryle Page, MD  Menthol , Topical Analgesic, (BIOFREEZE EX) Apply 1 application topically daily. To back of neck and lower back.    [provider]  methocarbamol  (ROBAXIN ) 500 MG tablet Take 1 tablet (500 mg total) by mouth 2 (two) times daily as needed for muscle spasms (hold for sedation). 02/24/21   Cheryle Page, MD  methocarbamol  (ROBAXIN ) 500 MG tablet Take 1 tablet (500 mg total) by mouth 2 (two) times daily. 05/04/21   Dasie Faden, MD  Multiple Vitamin  (MULTIVITAMIN WITH MINERALS) TABS tablet Take 1 tablet by mouth daily. 11/29/20   Patsy Lenis, MD  ondansetron  (ZOFRAN ) 4 MG tablet Take 1 tablet (4 mg total) by mouth every 6 (six) hours as needed for nausea. 07/04/20   Will Almarie MATSU, MD  oxyCODONE -acetaminophen  (PERCOCET/ROXICET) 5-325 MG tablet Take 1 tablet by mouth every 6 (six) hours as needed for severe pain. 05/10/21   Towana Ozell BROCKS, MD  pantoprazole  (PROTONIX ) 40 MG tablet Take 1 tablet (40 mg total) by mouth daily. 11/29/20   Patsy Lenis, MD  polyethylene glycol (MIRALAX  / GLYCOLAX ) 17 g packet Take 17 g by mouth daily as needed for mild constipation. 11/03/20   Pokhrel, Laxman, MD  predniSONE  (DELTASONE ) 20 MG tablet Take 2 tablets (40 mg total) by mouth daily with breakfast. For the next four days 06/03/21   Garrick Charleston, MD  rosuvastatin  (CRESTOR ) 40 MG tablet Take 40 mg by mouth every evening.     [provider]  SUMAtriptan  (IMITREX ) 25 MG tablet Take 25 mg by mouth daily as needed for migraine. May repeat in 2 hours if headache persists or recurs.    [provider]  tamsulosin  (FLOMAX ) 0.4 MG CAPS capsule Take 0.4 mg by mouth daily.    [provider]  thiamine  100 MG tablet Take 1 tablet (100 mg total) by mouth daily. 02/25/21   Cheryle Page, MD    Allergies: Cefepime, Iohexol , Fentanyl , and Iodine    Review of Systems  Constitutional:  Positive for fatigue. Negative for fever.  Respiratory:  Negative for shortness of breath.   Cardiovascular:  Negative for chest pain.  Gastrointestinal:  Positive for blood in stool. Negative for abdominal pain, diarrhea and vomiting.  Skin:  Negative for rash.  Neurological:  Negative for headaches.    Updated Vital Signs BP 116/62 (BP Location: Right Arm)   Pulse 88   Temp 98.6 F (37 C) (Oral)   SpO2 100%   Physical Exam Vitals and nursing note reviewed.  Constitutional:      General: He is not in acute distress.    Appearance: Normal  appearance.  HENT:     Head: Normocephalic.     Mouth/Throat:     Mouth: Mucous membranes are moist.  Cardiovascular:     Rate and Rhythm: Normal rate.  Pulmonary:     Effort: Pulmonary effort is normal. No respiratory distress.  Abdominal:     Palpations: Abdomen is soft.     Tenderness: There is no abdominal tenderness. There is no guarding or rebound.  Skin:    General: Skin is warm.  Neurological:     Mental Status: He is alert and oriented to person, place, and time. Mental status is at baseline.  Psychiatric:        Mood and  Affect: Mood normal.     (all labs ordered are listed, but only abnormal results are displayed) Labs Reviewed  CBC WITH DIFFERENTIAL/PLATELET  COMPREHENSIVE METABOLIC PANEL WITH GFR  LIPASE, BLOOD  TYPE AND SCREEN    EKG: None  Radiology: No results found.   Procedures   Medications Ordered in the ED - No data to display                                  Medical Decision Making Amount and/or Complexity of Data Reviewed Labs: ordered.   83 year old male presents emergency department with concern for anemia noted on outpatient blood work.  Of note he was recently admitted for GI bleed requiring transfusion, thought to be secondary to esophageal/small bowel AVMs.  Was supposed to follow-up with gastroenterology as an outpatient but returns today with worsening anemia.  Patient endorses generalized symptoms.  Vitals are stable on arrival, abdomen is benign.  Blood work has been ordered.  Fecal occult has been sent.       Final diagnoses:  None    ED Discharge Orders     None          Bari Roxie HERO, DO 08/08/24 1654

## 2024-08-08 NOTE — ED Provider Notes (Signed)
  Physical Exam  BP 116/62 (BP Location: Right Arm)   Pulse 88   Temp 98.6 F (37 C) (Oral)   SpO2 100%   Physical Exam  Procedures  .Critical Care  Performed by: Charlyn Sora, MD Authorized by: Charlyn Sora, MD   Critical care provider statement:    Critical care time (minutes):  32   Critical care was necessary to treat or prevent imminent or life-threatening deterioration of the following conditions:  Circulatory failure   Critical care was time spent personally by me on the following activities:  Development of treatment plan with patient or surrogate, discussions with consultants, evaluation of patient's response to treatment, examination of patient, ordering and review of laboratory studies, ordering and review of radiographic studies, ordering and performing treatments and interventions, pulse oximetry, re-evaluation of patient's condition, review of old charts and obtaining history from patient or surrogate   ED Course / MDM    Medical Decision Making Amount and/or Complexity of Data Reviewed Labs: ordered.  Risk Prescription drug management.   Assuming care of patient from Dr. Bari.   Patient in the ED for low Hb. Recent admission for gi bleed at [facility].   Concerning findings are as following - none. Important pending results are CBC and other labs.  According to Dr. Bari, plan is to f/u on CBC, might need admission. No active bleeding.    Patient had no complains, no concerns from the nursing side. Will continue to monitor.  4:50 PM  Reviewed care everywhere, patient has had 2 admissions in the last month because of GI bleed. He was also found to have pancytopenia. He received 1 unit of blood last admission.  He had more units of blood in the previous admission.  Pancytopenia finding is as following:  #Chronic pancytopenia #Neutropenia #Thrombocytopenia #Hx of RA WBC of 1-2, w/ ANC 0.9 on admission. PLT 100. Appears to be chronic. Counts  improved slightly through this admission including ANC>1. Hematology was consulted who suggested low c/f malignancy and good prognosis. Full set of hematology labs to be followed up outpatient. ID also was consulted who recommended no abx. Hematology recommended holding hydroxychloroquine . They also will arrange outpatient CBC with diff. They are considering additional liver imaging (CT w/ contrast vs MRI) to evaluate for cirrhosis to explain his pancytopenia. Filgastrim is under consideration as well.   Additionally, spoke with patient's daughter, who is requesting that patient not be sent to [facility] hospital.  From GI perspective, the next step is for patient to get pill endoscopy.  That appointment is on the 30th.  However she does not want to wait that long and also wants to just transition her care to GI at [facility].  We will consult GI unassigned. We will give patient 1 unit of blood and request admission.     Charlyn Sora, MD 08/08/24 1700

## 2024-08-08 NOTE — ED Notes (Signed)
 PT a difficult stick.ED RN tried to US  and was unsuccessful. He was able to pull minimal labs. IV team has been consulted.

## 2024-08-09 ENCOUNTER — Encounter (HOSPITAL_COMMUNITY)
Admission: EM | Disposition: A | Discharge status: Skilled Nursing Facility | Payer: Self-pay | Source: Home / Self Care | Attending: Family Medicine

## 2024-08-09 DIAGNOSIS — M810 Age-related osteoporosis without current pathological fracture: Secondary | ICD-10-CM | POA: Diagnosis present

## 2024-08-09 DIAGNOSIS — K5520 Angiodysplasia of colon without hemorrhage: Secondary | ICD-10-CM | POA: Diagnosis present

## 2024-08-09 DIAGNOSIS — Z91041 Radiographic dye allergy status: Secondary | ICD-10-CM | POA: Diagnosis not present

## 2024-08-09 DIAGNOSIS — R0609 Other forms of dyspnea: Secondary | ICD-10-CM | POA: Diagnosis not present

## 2024-08-09 DIAGNOSIS — D649 Anemia, unspecified: Secondary | ICD-10-CM | POA: Diagnosis not present

## 2024-08-09 DIAGNOSIS — D469 Myelodysplastic syndrome, unspecified: Secondary | ICD-10-CM | POA: Diagnosis present

## 2024-08-09 DIAGNOSIS — R531 Weakness: Secondary | ICD-10-CM | POA: Diagnosis present

## 2024-08-09 DIAGNOSIS — I10 Essential (primary) hypertension: Secondary | ICD-10-CM | POA: Diagnosis present

## 2024-08-09 DIAGNOSIS — F32A Depression, unspecified: Secondary | ICD-10-CM | POA: Diagnosis present

## 2024-08-09 DIAGNOSIS — D61818 Other pancytopenia: Secondary | ICD-10-CM | POA: Diagnosis present

## 2024-08-09 DIAGNOSIS — Z823 Family history of stroke: Secondary | ICD-10-CM | POA: Diagnosis not present

## 2024-08-09 DIAGNOSIS — Z8249 Family history of ischemic heart disease and other diseases of the circulatory system: Secondary | ICD-10-CM | POA: Diagnosis not present

## 2024-08-09 DIAGNOSIS — F419 Anxiety disorder, unspecified: Secondary | ICD-10-CM | POA: Diagnosis present

## 2024-08-09 DIAGNOSIS — Z23 Encounter for immunization: Secondary | ICD-10-CM | POA: Diagnosis present

## 2024-08-09 DIAGNOSIS — E119 Type 2 diabetes mellitus without complications: Secondary | ICD-10-CM | POA: Diagnosis present

## 2024-08-09 DIAGNOSIS — N400 Enlarged prostate without lower urinary tract symptoms: Secondary | ICD-10-CM | POA: Diagnosis present

## 2024-08-09 DIAGNOSIS — M069 Rheumatoid arthritis, unspecified: Secondary | ICD-10-CM | POA: Diagnosis present

## 2024-08-09 DIAGNOSIS — Z833 Family history of diabetes mellitus: Secondary | ICD-10-CM | POA: Diagnosis not present

## 2024-08-09 DIAGNOSIS — K769 Liver disease, unspecified: Secondary | ICD-10-CM | POA: Diagnosis present

## 2024-08-09 DIAGNOSIS — Z881 Allergy status to other antibiotic agents status: Secondary | ICD-10-CM | POA: Diagnosis not present

## 2024-08-09 DIAGNOSIS — F1120 Opioid dependence, uncomplicated: Secondary | ICD-10-CM | POA: Diagnosis present

## 2024-08-09 DIAGNOSIS — Z87891 Personal history of nicotine dependence: Secondary | ICD-10-CM | POA: Diagnosis not present

## 2024-08-09 DIAGNOSIS — Z885 Allergy status to narcotic agent status: Secondary | ICD-10-CM | POA: Diagnosis not present

## 2024-08-09 DIAGNOSIS — I2510 Atherosclerotic heart disease of native coronary artery without angina pectoris: Secondary | ICD-10-CM | POA: Diagnosis present

## 2024-08-09 DIAGNOSIS — G4733 Obstructive sleep apnea (adult) (pediatric): Secondary | ICD-10-CM | POA: Diagnosis present

## 2024-08-09 DIAGNOSIS — G894 Chronic pain syndrome: Secondary | ICD-10-CM | POA: Diagnosis present

## 2024-08-09 DIAGNOSIS — R233 Spontaneous ecchymoses: Secondary | ICD-10-CM | POA: Diagnosis not present

## 2024-08-09 DIAGNOSIS — I350 Nonrheumatic aortic (valve) stenosis: Secondary | ICD-10-CM | POA: Diagnosis present

## 2024-08-09 HISTORY — PX: GIVENS CAPSULE STUDY: SHX5432

## 2024-08-09 LAB — COMPREHENSIVE METABOLIC PANEL WITH GFR
ALT: 10 U/L (ref 0–44)
AST: 16 U/L (ref 15–41)
Albumin: 2.9 g/dL — ABNORMAL LOW (ref 3.5–5.0)
Alkaline Phosphatase: 53 U/L (ref 38–126)
Anion gap: 6 (ref 5–15)
BUN: 18 mg/dL (ref 8–23)
CO2: 29 mmol/L (ref 22–32)
Calcium: 8.5 mg/dL — ABNORMAL LOW (ref 8.9–10.3)
Chloride: 104 mmol/L (ref 98–111)
Creatinine, Ser: 1.09 mg/dL (ref 0.61–1.24)
GFR, Estimated: >60 mL/min (ref >60)
Glucose, Bld: 89 mg/dL (ref 70–99)
Potassium: 3.9 mmol/L (ref 3.5–5.1)
Sodium: 139 mmol/L (ref 135–145)
Total Bilirubin: 1.1 mg/dL (ref 0.0–1.2)
Total Protein: 5.8 g/dL — ABNORMAL LOW (ref 6.5–8.1)

## 2024-08-09 LAB — RETICULOCYTES
Immature Retic Fract: 23.3 % — ABNORMAL HIGH (ref 2.3–15.9)
RBC.: 2.6 MIL/uL — ABNORMAL LOW (ref 4.22–5.81)
Retic Count, Absolute: 87.1 K/uL (ref 19.0–186.0)
Retic Ct Pct: 3.4 % — ABNORMAL HIGH (ref 0.4–3.1)

## 2024-08-09 LAB — BPAM RBC
Blood Product Expiration Date: 202510052359
ISSUE DATE / TIME: 202509182102
Unit Type and Rh: 6200

## 2024-08-09 LAB — TYPE AND SCREEN
ABO/RH(D): A POS
Antibody Screen: NEGATIVE
Unit division: 0

## 2024-08-09 LAB — TSH: TSH: 2.162 u[IU]/mL (ref 0.350–4.500)

## 2024-08-09 LAB — CBC
HCT: 23.4 % — ABNORMAL LOW (ref 39.0–52.0)
Hemoglobin: 7.6 g/dL — ABNORMAL LOW (ref 13.0–17.0)
MCH: 28.9 pg (ref 26.0–34.0)
MCHC: 32.5 g/dL (ref 30.0–36.0)
MCV: 89 fL (ref 80.0–100.0)
Platelets: 108 K/uL — ABNORMAL LOW (ref 150–400)
RBC: 2.63 MIL/uL — ABNORMAL LOW (ref 4.22–5.81)
RDW: 15 % (ref 11.5–15.5)
WBC: 2 K/uL — ABNORMAL LOW (ref 4.0–10.5)
nRBC: 0 % (ref 0.0–0.2)

## 2024-08-09 LAB — VITAMIN B12: Vitamin B-12: 1823 pg/mL — ABNORMAL HIGH (ref 180–914)

## 2024-08-09 LAB — FERRITIN: Ferritin: 591 ng/mL — ABNORMAL HIGH (ref 24–336)

## 2024-08-09 LAB — IRON AND TIBC
Iron: 139 ug/dL (ref 45–182)
Saturation Ratios: 89 % — ABNORMAL HIGH (ref 17.9–39.5)
TIBC: 157 ug/dL — ABNORMAL LOW (ref 250–450)
UIBC: 18 ug/dL

## 2024-08-09 LAB — PROTIME-INR
INR: 1.1 (ref 0.8–1.2)
Prothrombin Time: 15.1 s (ref 11.4–15.2)

## 2024-08-09 LAB — FOLATE: Folate: >20 ng/mL (ref >5.9)

## 2024-08-09 SURGERY — IMAGING PROCEDURE, GI TRACT, INTRALUMINAL, VIA CAPSULE
Anesthesia: LOCAL

## 2024-08-09 MED ORDER — ACETAMINOPHEN 325 MG PO TABS
650.0000 mg | ORAL_TABLET | Freq: Four times a day (QID) | ORAL | Status: DC | PRN
  Administered 2024-08-09 – 2024-08-18 (×16): 650 mg via ORAL
  Filled 2024-08-09 (×15): qty 2

## 2024-08-09 MED ORDER — SODIUM CHLORIDE 0.9 % IV SOLN: INTRAVENOUS | Status: DC

## 2024-08-09 SURGICAL SUPPLY — 1 items: TOWEL COTTON PACK 4EA (MISCELLANEOUS) ×2 IMPLANT

## 2024-08-09 NOTE — Progress Notes (Addendum)
 [facility]  PROGRESS NOTE  Jorge Sherman FMW:[id number] DOB: 12/08/1940 DOA: 08/08/2024 PCP: [name] LABOR, MD   Brief HPI:   83 y.o. male with medical history significant of recent admissions due to GI bleeding, anemia, chronic pain, depression/anxiety, T2DM and multiple other medical issues here with complaints of dizziness and labs concerning for Hb 6.8.   He notes generalized weakness and dizziness as well as SOB with exertion.  Denies noticing any recent GI bleeding.  His history is notable for recent admissions to [facility].  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  Had repeat EGD on 9/5 which was unremarkable.  There was a plan for an outpatient capsule endoscopy, this is now scheduled for 9/30.  He denies CP.  Notes nausea, but no vomiting.  No smoking or drinking.  His hb was noted to be 6.8 at [name] and he was brought to the ED.    Assessment/Plan:   Symptomatic Anemia Recently discharged after admission for acute blood loss anemia with upper GI bleed with melena.  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  EDG 9/5 showed normal esophagus and stomach as well as the duodenal bulb and 2nd part of duodenum.  Small bowel AVM's were suspected based on recent discharge summary.  GI recommended an outpatient capsule endoscopy on 9/10 , did not get capsule study as outpatient. -Hemoglobin was 7.1 on admission, status post 1 unit PRBC -Hemoglobin is up to 7.6 Continue Protonix  40 mg IV every 12 hours - Gastroenterology consulted   Pancytopenia New as of 2025 it appears Seen by hematology at [facility] on 07/29/2024 who recommended holding hydroxychloroquine .  Chronic liver disease thought a possible etiology. Follow CBC with diff    Concern for Chronic Liver Disease US  with nodular contour, coarse echotexture, and increased echogenicity - possible chronic liver disease Follow INR   Chronic Pain  Continue cymbalta , lyrica , morphine  Was on butrans  patch  It looks like they  adjusted his pain medicines, but he was discharged on 60 cymbalta , 50 mg lyrica , morphine  15 mg q8 prn, butran 20 mcg/hr patch weekly    Anxiety  Depression Buspar  cymbalta  Ativan     History of RA Holding hydroxychloroquine     BPH  Flomax     Hx DM Recent A1c 01/2021 was 5.4 CBG well-controlled    Moderate to Severe AS Follow with cards outpatient   Muscle Tension Dysphonia Seen by ENT 9/17 Follow outpatient with ENT/SLP (recommended [facility] follow up for voice therapy)       Medications     atorvastatin   80 mg Oral Daily   buprenorphine   2 patch Transdermal Weekly   busPIRone   15 mg Oral TID   DULoxetine   60 mg Oral Daily   ferrous sulfate   325 mg Oral QODAY   linaclotide   145 mcg Oral QAC breakfast   LORazepam   0.25 mg Oral BID   pantoprazole  (PROTONIX ) IV  40 mg Intravenous Q12H   pregabalin   50 mg Oral BID   tamsulosin   0.4 mg Oral QPC supper   traZODone   50 mg Oral QHS     Data Reviewed:   CBG:  No results for input(s): GLUCAP in the last 168 hours.  SpO2: 95 %    Vitals:   08/09/24 0040 08/09/24 0447 08/09/24 0811 08/09/24 1200  BP: (!) 151/85 104/62 (!) 156/77 (!) 153/78  Pulse: 88 84 90 85  Resp:  18    Temp: 98.3 F (36.8 C) 98.3 F (36.8 C) 98.3  F (36.8 C) 98.1 F (36.7 C)  TempSrc: Oral Oral Oral Oral  SpO2: 96% 95% 98% 95%  Weight:      Height:          Data Reviewed:  Basic Metabolic Panel: Recent Labs  Lab 08/08/24 1505 08/09/24 0407  NA 134* 139  K 4.5 3.9  CL 100 104  CO2 26 29  GLUCOSE 136* 89  BUN 24* 18  CREATININE 1.14 1.09  CALCIUM  8.7* 8.5*    CBC: Recent Labs  Lab 08/08/24 1505 08/09/24 0407  WBC 1.9* 2.0*  NEUTROABS 1.0*  --   HGB 7.1* 7.6*  HCT 23.1* 23.4*  MCV 92.4 89.0  PLT 113* 108*    LFT Recent Labs  Lab 08/08/24 1505 08/09/24 0407  AST 18 16  ALT 12 10  ALKPHOS 66 53  BILITOT 0.7 1.1  PROT 6.5 5.8*  ALBUMIN 3.4* 2.9*     Antibiotics: Anti-infectives (From admission,  onward)    None        DVT prophylaxis: SCDs  Code Status: Full code  Family Communication: No family at bedside   CONSULTS gastroenterology   Subjective   Denies abdominal pain.  Denies black stool.   Objective    Physical Examination:   General-appears in no acute distress Heart-S1-S2, regular, grade 3 x 6 ejection systolic murmur auscultated aortic area and apex of the heart Lungs-clear to auscultation bilaterally, no wheezing or crackles auscultated Abdomen-soft, nontender, no organomegaly Extremities-no edema in the lower extremities Neuro-alert, oriented x3, no focal deficit noted  Status is: Inpatient:             Jorge Sherman S Meliss Fleek   [facility] If 7PM-7AM, please contact night-coverage at [email or web address], Office  [phone]   08/09/2024, 3:45 PM  LOS: 0 days

## 2024-08-09 NOTE — Progress Notes (Signed)
 [facility] capsule endoscopy ordered by MD Magod.  Patient ingested capsule at 1611hrs.  Per Given's capsule instructions, patient to remain NPO until 1811hrs at which time they may progress to clear liquid diet. At 2011hrs patient may have a small snack such as a half a sandwich or a bowl of soup. At 0011hrs (9/20) patient may progress to previously ordered diet.  The capsule endoscopy study will conclude at 0411hrs (9/20) at which time the recorder and leads or belt can be removed and placed in a patient belongings bag. Endoscopy staff will pick up the equipment in the AM.  Instructions provided to patient and inpatient RN. Patient and RN demonstrated understanding.

## 2024-08-09 NOTE — Progress Notes (Signed)
 With the patients permission, I spoke with the daughter Olam from [place]  as she requested updates. She was updated on plan of care and encouraged to call back for more information as she needs it.

## 2024-08-09 NOTE — Plan of Care (Signed)

## 2024-08-09 NOTE — Consult Note (Signed)
 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 Readmitted with symptomatic anemia Takes oral iron so stools dark.  Presenting hemoglobin 7.1 ( down from 9 after blood transfusions at [facility] on 07/29/24 .  Currently hemoglobin stable at 7.6 but up only half a gram following a unit of RBCs    Chronic pancytopenia  Abnormal liver on imaging Ultrasound shows slightly nodular liver. Visualized portions of the portal and hepatic venous systems are patent with antegrade flow.  INR is normal.  He does have chronic thrombocytopenia  Cholelithiasis  OSA  CAD  Moderate to severe aortic stenosis  Rheumatoid arthritis  Chronic thrombocytopenia  Opioid dependence  Osteoporosis  See PMH for any additional medical history/problems.    Principal Problem:   Symptomatic anemia   PLAN:   Probably proceed with small bowel video capsule study this admisison  HPI   Brief history  Mr. Fortunato has a history of gastrointestinal bleeding.  He was hospitalized earlier this month at [facility] with GI bleed.  Discharge summary from 07/25/2024 in Care Everywhere was reviewed .  Patient hospitalized at [facility] in August 2025 with GI bleeding / melena secondary to GAVE status post APC.  He required a blood transfusion for hemoglobin in the 6 range.  He was readmitted earlier this month with recurrent drop in hemoglobin and melena . Repeat  upper endoscopy was unimpressive without GAVE .  Bleeding felt to be secondary to small bowel AVMs .  During that admission  patient had some garbled speech.  He does have a history of intracranial small vessel disease but per family his baseline was with completely normal cognition.  Psychiatry evaluated and made some medication changes due to concern for polypharmacy.  Plan at the time of discharge was for a pill camera to be done on September 10.   Interval history Patient was brought from [name] to the ED yesterday for evaluation of critical labs with hemoglobin of 6.8.  He was weak and dizzy his stools are dark on iron.  He has not seen any red blood in his stools.  He has no abdominal pain.  No nausea or vomiting.    Pertinent GI Studies   Extracted from Care Everywhere.   07/26/2024 EGD[facility] Findings  The esophagus appeared normal.  The stomach appeared normal. Blood is not seen  The duodenal bulb and 2nd part of the duodenum appeared norma   06/25/24 Colonoscopy - [facility] The terminal ileum appeared normal. One 6 mm sessile polyp in the hepatic flexure; performed cold snare with complete en bloc removal and retrieved specimen One 5 mm sessile polyp in the sigmoid colon; performed cold snare with complete en bloc removal and retrieved specimen Diverticula in the sigmoid colon Internal large hemorrhoids. Anal papilla is present   06/25/2024 EGD -[facility] Impression  The esophagus appeared normal.  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  The duodenal bulb, 1st part of the duodenum, 2nd part of the duodenum, 3rd  part of the duodenum and 4th part of the duodenum appeared normal.  The scope was advanced to the proximal jejunum and no small bowel lesions  were identified.    7/25 egd/colon for acute on chronic anemia - [facility]  The esophagus appeared normal. The stomach appeared normal. Hill flap valve grade I The duodenal bulb and 2nd part of the duodenum  appeared normal. Performed random biopsy using biopsy forceps to rule out celiac disease.    Colon Findings The terminal ileum appeared normal. One 6 mm sessile polyp in the hepatic flexure; performed cold snare with complete en bloc removal and retrieved specimen One 5 mm sessile polyp in the sigmoid colon; performed cold snare with complete en bloc removal and retrieved specimen Diverticula in the sigmoid colon Internal large hemorrhoids. Anal papilla is present    A.  DUODENAL, BIOPSY:                DUODENAL MUCOSA WITH PRESERVED VILLOUS ARCHITECTURE.                NO SIGNIFICANT HISTOPATHOLOGIC ABNORMALITIES.   B.  HEPATIC FLEXURE POLYP, BIOPSY:                TUBULAR ADENOMA.   C.  SIGMOID COLON POLYP, BIOPSY:                HYPERPLASTIC POLYP.     Labs and Imaging:  Recent Labs    08/08/24 1505 08/09/24 0407  PROT 6.5 5.8*  ALBUMIN 3.4* 2.9*  AST 18 16  ALT 12 10  ALKPHOS 66 53  BILITOT 0.7 1.1   Recent Labs    08/08/24 1505 08/09/24 0407  WBC 1.9* 2.0*  HGB 7.1* 7.6*  HCT 23.1* 23.4*  MCV 92.4 89.0  PLT 113* 108*   Recent Labs    08/08/24 1505 08/09/24 0407  NA 134* 139  K 4.5 3.9  CL 100 104  CO2 26 29  GLUCOSE 136* 89  BUN 24* 18  CREATININE 1.14 1.09  CALCIUM  8.7* 8.5*     DG Chest Portable 1 View CLINICAL DATA:  Shortness of breath.  EXAM: PORTABLE CHEST 1 VIEW  COMPARISON:  This 08/10/2021  FINDINGS: Cardiomediastinal silhouette is normal. Mediastinal contours appear intact.  Tortuosity of the aorta.  There is no evidence of focal airspace consolidation, pleural effusion or pneumothorax. Low lung volumes  Osseous structures are without acute abnormality. Soft tissues are grossly normal.  IMPRESSION: No active disease.  Electronically Signed   By: Dobrinka  Dimitrova M.D.   On: 09/26/2021 11:14    Past Medical History:  Diagnosis Date   Chronic pain    Depressive disorder, not elsewhere classified     Dizziness and giddiness    Essential and other specified forms of tremor    Headache(784.0)    Leukocytosis, unspecified    Obstructive sleep apnea (adult) (pediatric)    Pain in joint, lower leg    Septic joint of left knee joint (HCC) 04/22/2019   Spinal stenosis, lumbar region, without neurogenic claudication    Thoracic or lumbosacral neuritis or radiculitis, unspecified    Type II or unspecified type diabetes mellitus without mention of complication, not stated  as uncontrolled    Unspecified cardiovascular disease    Unspecified essential hypertension     Past Surgical History:  Procedure Laterality Date   BACK SURGERY     back surgey     times 3   IR INJECT/THERA/INC NEEDLE/CATH/PLC EPI/LUMB/SAC W/IMG  11/26/2020    Family History  Problem Relation Age of Onset   Cancer Father    Heart disease Father    Stroke Mother    Diabetes Mother     Prior to Admission medications   Medication Sig Start Date End Date Taking? Authorizing Provider  acetaminophen  (TYLENOL ) 500 MG tablet Take 2 tablets (1,000 mg total) by mouth every 6 (six) hours as needed for headache, mild pain or fever. Patient taking differently: Take 1,000 mg by mouth every 8 (eight) hours as needed for headache, mild pain (pain score 1-3) or fever. 11/28/20  Yes Patsy Lenis, MD  albuterol  (VENTOLIN  HFA) 108 (90 Base) MCG/ACT inhaler Inhale 2 puffs into the lungs every 4 (four) hours as needed for wheezing or shortness of breath. 11/03/20  Yes Pokhrel, Laxman, MD  ascorbic acid  (VITAMIN C) 500 MG tablet Take 500 mg by mouth every morning.   Yes [provider]  atorvastatin  (LIPITOR ) 80 MG tablet Take 80 mg by mouth at bedtime. 12/14/20  Yes [provider]  busPIRone  (BUSPAR ) 15 MG tablet Take 15 mg by mouth 3 (three) times daily. 07/02/24  Yes [provider]  Cranberry 450 MG TABS Take 450 mg by mouth in the morning.   Yes [provider]  Cyanocobalamin 5000 MCG TBDP Place 5,000  mcg under the tongue every morning.   Yes [provider]  cyclobenzaprine (FLEXERIL) 10 MG tablet Take 10 mg by mouth 3 (three) times daily as needed for muscle spasms.   Yes [provider]  Docusate Sodium  (DSS) 100 MG CAPS Take 100 mg by mouth in the morning and at bedtime.   Yes [provider]  DULoxetine  (CYMBALTA ) 60 MG capsule Take 60 mg by mouth daily. 07/14/24  Yes [provider]  ferrous sulfate  325 (65 FE) MG tablet Take 325 mg by mouth daily.    Yes [provider]  fluticasone (FLONASE) 50 MCG/ACT nasal spray Place 1 spray into the nose daily. 06/04/22  Yes [provider]  folic acid  (FOLVITE ) 400 MCG tablet Take 400 mcg by mouth every morning.   Yes [provider]  guaiFENesin (ROBITUSSIN) 100 MG/5ML liquid Take 10 mLs by mouth every 4 (four) hours as needed for cough.   Yes [provider]  hydroxychloroquine  (PLAQUENIL ) 200 MG tablet Take 200 mg by mouth 2 (two) times daily.   Yes [provider]  Hypromellose (NATURAL BALANCE TEARS OP) Place 2 drops into both eyes in the morning and at bedtime.   Yes [provider]  lidocaine  4 % Place 1 patch onto the skin in the morning. 07/29/24 10/27/24 Yes [provider]  LINZESS  145 MCG CAPS capsule Take 145 mcg by mouth daily. 07/11/24  Yes [provider]  loratadine (CLARITIN) 10 MG tablet Take 10 mg by mouth in the morning.   Yes [provider]  LORazepam  (ATIVAN ) 0.5 MG tablet Take 0.5 mg by mouth 2 (two) times daily. 07/02/24  Yes [provider]  melatonin 3 MG TABS tablet Take 3 mg by mouth at bedtime. 07/29/24 10/27/24 Yes [provider]  morphine  (MSIR) 15 MG tablet Take 15 mg by mouth every 8 (eight) hours as  needed for severe pain (pain score 7-10).   Yes [provider]  Multiple Vitamin (MULTIVITAMIN WITH MINERALS) TABS tablet Take 1 tablet by mouth daily. 11/29/20  Yes Patsy Lenis, MD   ondansetron  (ZOFRAN ) 4 MG tablet Take 1 tablet (4 mg total) by mouth every 6 (six) hours as needed for nausea. 07/04/20  Yes Will Almarie MATSU, MD  OXYGEN  Inhale 2 L into the lungs continuous.   Yes [provider]  pantoprazole  (PROTONIX ) 40 MG tablet Take 1 tablet (40 mg total) by mouth daily. 11/29/20  Yes Patsy Lenis, MD  polyethylene glycol (MIRALAX  / GLYCOLAX ) 17 g packet Take 17 g by mouth daily as needed for mild constipation. Patient taking differently: Take 17 g by mouth daily. 11/03/20  Yes Pokhrel, Laxman, MD  pregabalin  (LYRICA ) 75 MG capsule Take 75 mg by mouth 2 (two) times daily. 07/02/24  Yes [provider]  senna (SENOKOT) 8.6 MG tablet Take 2 tablets by mouth every 12 (twelve) hours as needed for constipation.   Yes [provider]  senna-docusate (SENOKOT-S) 8.6-50 MG tablet Take 2 tablets by mouth 2 (two) times daily. 11/11/22 09/29/24 Yes [provider]  SUMAtriptan  (IMITREX ) 50 MG tablet Take 50 mg by mouth every 2 (two) hours as needed for migraine or headache. 07/01/24  Yes [provider]  tamsulosin  (FLOMAX ) 0.4 MG CAPS capsule Take 0.4 mg by mouth every morning.   Yes [provider]  thiamine  100 MG tablet Take 1 tablet (100 mg total) by mouth daily. 02/25/21  Yes Cheryle Page, MD  traZODone  (DESYREL ) 50 MG tablet Take 50 mg by mouth at bedtime. 08/27/21 10/27/24 Yes [provider]    Current Facility-Administered Medications  Medication Dose Route Frequency Provider Last Rate Last Admin   atorvastatin  (LIPITOR ) tablet 80 mg  80 mg Oral Daily Perri DELENA Meliton Mickey., MD   80 mg at 08/09/24 0849   buprenorphine  (BUTRANS ) 10 MCG/HR 2 patch  2 patch Transdermal Weekly Perri DELENA Meliton Mickey., MD   2 patch at 08/09/24 9155   busPIRone  (BUSPAR ) tablet 15 mg  15 mg Oral TID Perri DELENA Meliton Mickey., MD   15 mg at 08/09/24 0849   DULoxetine  (CYMBALTA ) DR capsule 60 mg  60 mg Oral Daily Perri DELENA Meliton Mickey., MD    60 mg at 08/09/24 9149   ferrous sulfate  tablet 325 mg  325 mg Oral QODAY Powell, A Caldwell Jr., MD   325 mg at 08/08/24 2123   linaclotide  (LINZESS ) capsule 145 mcg  145 mcg Oral QAC breakfast Perri DELENA Meliton Mickey., MD   145 mcg at 08/09/24 0600   LORazepam  (ATIVAN ) tablet 0.25 mg  0.25 mg Oral BID Perri DELENA Meliton Mickey., MD   0.25 mg at 08/08/24 2123   morphine  (MSIR) tablet 15 mg  15 mg Oral Q8H PRN Perri DELENA Meliton Mickey., MD   15 mg at 08/09/24 0600   pantoprazole  (PROTONIX ) injection 40 mg  40 mg Intravenous Q12H Perri DELENA Meliton Mickey., MD   40 mg at 08/08/24 2124   pregabalin  (LYRICA ) capsule 50 mg  50 mg Oral BID Perri DELENA Meliton Mickey., MD   50 mg at 08/08/24 2123   tamsulosin  (FLOMAX ) capsule 0.4 mg  0.4 mg Oral QPC supper Perri DELENA Meliton Mickey., MD       traZODone  (DESYREL ) tablet 50 mg  50 mg Oral QHS Perri DELENA Meliton Mickey., MD   50 mg at 08/08/24 2123    Allergies as of  08/08/2024 - Unable to Assess 08/08/2024  Allergen Reaction Noted   Cefepime Rash 09/28/2019   Iohexol  Hives and Other (See Comments) 12/24/2003   Fentanyl  Itching, Nausea And Vomiting, and Other (See Comments) 09/03/2018   Iodine Hives and Rash 12/03/2013    Social History   Socioeconomic History   Marital status: Legally Separated    Spouse name: Lonell   Number of children: 4   Years of education: Not on file   Highest education level: Not on file  Occupational History    Employer: DISABLED  Tobacco Use   Smoking status: Former   Smokeless tobacco: Never  Vaping Use   Vaping status: Never Used  Substance and Sexual Activity   Alcohol  use: No   Drug use: No   Sexual activity: Not Currently  Other Topics Concern   Not on file  Social History Narrative   ** Merged History Encounter **       Patient lives at home with wife.    Patient has 4 children.         Social Drivers of Corporate investment banker Strain: Low Risk  (11/01/2022)   Received from [facility]   Overall Financial  Resource Strain (CARDIA)    Difficulty of Paying Living Expenses: Not hard at all  Food Insecurity: No Food Insecurity (08/09/2024)   Hunger Vital Sign    Worried About Running Out of Food in the Last Year: Never true    Ran Out of Food in the Last Year: Never true  Transportation Needs: No Transportation Needs (07/25/2024)   Received from [facility]    In the past 12 months, has lack of reliable transportation kept you from medical appointments, meetings, work or from getting things needed for daily living? : No  Physical Activity: Not on file  Stress: No Stress Concern Present (11/01/2022)   Received from [facility] - Occupational Stress Questionnaire    Feeling of Stress : Not at all  Social Connections: Unknown (03/23/2022)   [facility]   Social Network    Social Network: Not on file  Intimate Partner Violence: Not At Risk (06/01/2023)   Received from [facility]   HITS    Over the last 12 months how often did your partner physically hurt you?: Never    Over the last 12 months how often did your partner insult you or talk down to you?: Never    Over the last 12 months how often did your partner threaten you with physical harm?: Never    Over the last 12 months how often did your partner scream or curse at you?: Never     Code Status   Code Status: Full Code  Review of Systems: All systems reviewed and negative except where noted in HPI.  Physical Exam: Vital signs in last 24 hours: Temp:  [98.3 F (36.8 C)-99.2 F (37.3 C)] 98.3 F (36.8 C) (09/19 0811) Pulse Rate:  [80-90] 90 (09/19 0811) Resp:  [18] 18 (09/19 0447) BP: (104-156)/(53-89) 156/77 (09/19 0811) SpO2:  [95 %-100 %] 98 % (09/19 0811) Weight:  [97.3 kg-107 kg] 97.3 kg (09/18 1847) Last BM Date : 08/08/24  General:  Pleasant obese male in NAD Psych:  Cooperative. Normal mood and affect Eyes: Pupils equal Ears:  Normal  auditory acuity Nose: No deformity, discharge or lesions Neck:  Supple, no masses felt Lungs:  Clear to auscultation.  Heart: Loud  murmur , regular rate.  Abdomen:  Soft, nondistended, nontender, active bowel sounds, no masses felt Rectal :  Deferred Msk: Symmetrical without gross deformities.  Neurologic:  Alert, oriented, grossly normal neurologically Extremities : No edema Skin:  Intact without significant lesions.    Intake/Output from previous day: 09/18 0701 - 09/19 0700 In: 390.5 [Blood:390.5] Out: 601 [Urine:601] Intake/Output this shift:  Total I/O In: -  Out: 450 [Urine:450]   Vina Dasen, NP-C   08/09/2024, 9:21 AM

## 2024-08-09 NOTE — Care Management Obs Status (Signed)
 MEDICARE OBSERVATION STATUS NOTIFICATION   Patient Details  Name: Jorge Sherman MRN: [id number] Date of Birth: 07/11/1941   Medicare Observation Status Notification Given:  Yes  Spoke with the patient Daughter who was not happy about her father being under obs.  Fahad Cisse 08/09/2024, 3:33 PM

## 2024-08-10 DIAGNOSIS — D649 Anemia, unspecified: Secondary | ICD-10-CM | POA: Diagnosis not present

## 2024-08-10 LAB — CBC WITH DIFFERENTIAL/PLATELET
Abs Immature Granulocytes: 0.01 K/uL (ref 0.00–0.07)
Basophils Absolute: 0 K/uL (ref 0.0–0.1)
Basophils Relative: 0 %
Eosinophils Absolute: 0 K/uL (ref 0.0–0.5)
Eosinophils Relative: 1 %
HCT: 25 % — ABNORMAL LOW (ref 39.0–52.0)
Hemoglobin: 8.2 g/dL — ABNORMAL LOW (ref 13.0–17.0)
Immature Granulocytes: 1 %
Lymphocytes Relative: 44 %
Lymphs Abs: 0.9 K/uL (ref 0.7–4.0)
MCH: 29.3 pg (ref 26.0–34.0)
MCHC: 32.8 g/dL (ref 30.0–36.0)
MCV: 89.3 fL (ref 80.0–100.0)
Monocytes Absolute: 0.2 K/uL (ref 0.1–1.0)
Monocytes Relative: 8 %
Neutro Abs: 1 K/uL — ABNORMAL LOW (ref 1.7–7.7)
Neutrophils Relative %: 46 %
Platelets: 123 K/uL — ABNORMAL LOW (ref 150–400)
RBC: 2.8 MIL/uL — ABNORMAL LOW (ref 4.22–5.81)
RDW: 15.1 % (ref 11.5–15.5)
Smear Review: NORMAL
WBC: 2.1 K/uL — ABNORMAL LOW (ref 4.0–10.5)
nRBC: 0 % (ref 0.0–0.2)

## 2024-08-10 LAB — BASIC METABOLIC PANEL WITH GFR
Anion gap: 9 (ref 5–15)
BUN: 13 mg/dL (ref 8–23)
CO2: 23 mmol/L (ref 22–32)
Calcium: 8.7 mg/dL — ABNORMAL LOW (ref 8.9–10.3)
Chloride: 100 mmol/L (ref 98–111)
Creatinine, Ser: 0.96 mg/dL (ref 0.61–1.24)
GFR, Estimated: >60 mL/min (ref >60)
Glucose, Bld: 91 mg/dL (ref 70–99)
Potassium: 4.3 mmol/L (ref 3.5–5.1)
Sodium: 132 mmol/L — ABNORMAL LOW (ref 135–145)

## 2024-08-10 NOTE — Progress Notes (Signed)
 PROGRESS NOTE    Jorge Sherman  FMW:[id number] DOB: November 10, 1941 DOA: 08/08/2024 PCP: [name] LABOR, MD  Chief Complaint  Patient presents with   Abnormal Lab    Hgb: 6.8 WBC: 1.6    Brief Narrative:   83 y.o. male with medical history significant of recent admissions due to GI bleeding, anemia, chronic pain, depression/anxiety, T2DM and multiple other medical issues here with complaints of dizziness and labs concerning for Hb 6.8.   He was admitted for symptomatic anemia.  Assessment & Plan:   Principal Problem:   Symptomatic anemia  Symptomatic Anemia Recently discharged after admission for acute blood loss anemia with upper GI bleed with melena.  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  EDG 9/5 showed normal esophagus and stomach as well as the duodenal bulb and 2nd part of duodenum.  Small bowel AVM's were suspected based on recent discharge summary.  GI recommended an outpatient capsule endoscopy on 9/10 (looks like he hasn't been able to get this). At discharge 9/8 Hb 9 On admission today, Hb was 7.1  Hb 8.2 today Transfused 1 unit BID PPI for now  GI consulted - awaiting capsule endoscopy pictures to be downloaded Inadequate retic.  Elevated b12, elevated folate.  Ferritin elevated.   Pancytopenia New as of 2025 it appears Seen by hematology who recommended holding hydroxychloroquine .  Chronic liver disease thought Ziyan Hillmer possible etiology. Follow CBC with diff ANC 1.0  Follow copper , zinc  levels Consider hematology c/s (vs outpatient follow up)   Concern for Chronic Liver Disease US  with nodular contour, coarse echotexture, and increased echogenicity - possible chronic liver disease Follow INR - 1.1 (9/19)   Chronic Pain  Continue cymbalta , lyrica , morphine  Will hold muscle relaxer for now Was on butrans  patch  It looks like they adjusted his pain medicines, but he was discharged on 60 cymbalta , 50 mg lyrica , morphine  15 mg q8 prn, butran 20 mcg/hr patch weekly     Anxiety  Depression Buspar  cymbalta  Ativan     History of RA Holding hydroxychloroquine     BPH  Flomax     Hx DM Recent A1c 01/2021 was 5.4 A1c likely unreliable with his anemia BG's reasonable    Moderate to Severe AS Follow with cards outpatient   Muscle Tension Dysphonia Seen by ENT 9/17 Follow outpatient with ENT/SLP (recommended [facility] follow up for voice therapy)    DVT prophylaxis: SCD Code Status: full Family Communication: none Disposition:   Status is: Inpatient Remains inpatient appropriate because: need to continue anemia workup    Consultants:  GI  Procedures:  Capsule endoscopy  Antimicrobials:  Anti-infectives (From admission, onward)    None       Subjective: No new complaints  Objective: Vitals:   08/09/24 1956 08/10/24 0013 08/10/24 0352 08/10/24 0809  BP: 130/77 (!) 147/73 (!) 146/71 (!) 149/92  Pulse: 79 74 81 79  Resp: 16 16 16 18   Temp: 98.1 F (36.7 C) 98.3 F (36.8 C) (!) 97.5 F (36.4 C) 98.1 F (36.7 C)  TempSrc: Oral Oral Oral   SpO2: 98% 97% 97% 93%  Weight:      Height:        Intake/Output Summary (Last 24 hours) at 08/10/2024 1247 Last data filed at 08/10/2024 1100 Gross per 24 hour  Intake --  Output 2300 ml  Net -2300 ml   Filed Weights   08/08/24 1743 08/08/24 1847 08/09/24 1603  Weight: 107 kg 97.3 kg 97.3 kg    Examination:  General exam: Appears  calm and comfortable  Respiratory system: unlabored Cardiovascular system: RRR Gastrointestinal system: Abdomen is nondistended, soft and nontender.  Central nervous system: Alert - moving all extremities Extremities: no LEE   Data Reviewed: I have personally reviewed following labs and imaging studies  CBC: Recent Labs  Lab 08/08/24 1505 08/09/24 0407 08/10/24 0548  WBC 1.9* 2.0* 2.1*  NEUTROABS 1.0*  --  1.0*  HGB 7.1* 7.6* 8.2*  HCT 23.1* 23.4* 25.0*  MCV 92.4 89.0 89.3  PLT 113* 108* 123*    Basic Metabolic Panel: Recent Labs  Lab  08/08/24 1505 08/09/24 0407 08/10/24 0548  NA 134* 139 132*  K 4.5 3.9 4.3  CL 100 104 100  CO2 26 29 23   GLUCOSE 136* 89 91  BUN 24* 18 13  CREATININE 1.14 1.09 0.96  CALCIUM  8.7* 8.5* 8.7*    GFR: Estimated Creatinine Clearance: 68.2 mL/min (by C-G formula based on SCr of 0.96 mg/dL).  Liver Function Tests: Recent Labs  Lab 08/08/24 1505 08/09/24 0407  AST 18 16  ALT 12 10  ALKPHOS 66 53  BILITOT 0.7 1.1  PROT 6.5 5.8*  ALBUMIN 3.4* 2.9*    CBG: No results for input(s): GLUCAP in the last 168 hours.   No results found for this or any previous visit (from the past 240 hours).       Radiology Studies: No results found.      Scheduled Meds:  atorvastatin   80 mg Oral Daily   buprenorphine   2 patch Transdermal Weekly   busPIRone   15 mg Oral TID   DULoxetine   60 mg Oral Daily   ferrous sulfate   325 mg Oral QODAY   linaclotide   145 mcg Oral QAC breakfast   LORazepam   0.25 mg Oral BID   pantoprazole  (PROTONIX ) IV  40 mg Intravenous Q12H   pregabalin   50 mg Oral BID   tamsulosin   0.4 mg Oral QPC supper   traZODone   50 mg Oral QHS   Continuous Infusions:  sodium chloride        LOS: 1 day    Time spent: over 30 min     Meliton Monte, MD [facility]   To contact the attending provider between 7A-7P or the covering provider during after hours 7P-7A, please log into the web site [email or web address] and access using universal [place] password for that web site. If you do not have the password, please call the hospital operator.  08/10/2024, 12:47 PM

## 2024-08-10 NOTE — Brief Op Note (Signed)
 08/09/2024  1:51 PM  PATIENT:  Jorge Sherman  83 y.o. male  PRE-OPERATIVE DIAGNOSIS:  Obscure GI blood loss  POST-OPERATIVE DIAGNOSIS: No evidence of active bleeding  PROCEDURE:  Procedure(s): IMAGING PROCEDURE, GI TRACT, INTRALUMINAL, VIA CAPSULE (N/A)  SURGEON:   -Dr. Elicia  Findings ----------- - Capsule endoscopy showed mild inflammation in the stomach and proximal duodenum. 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  Recommendations ------------------------- - Okay to discharge from GI standpoint. - Recommend hematology consult as an outpatient for pancytopenia - No further inpatient GI workup planned.  GI will sign off.  Call us  back if needed. - Follow-up with primary GI doctor in 2 to 3 months after discharge for anemia and possible cirrhosis of the liver.  Layla Elicia MD, FACP 08/10/2024, 1:53 PM  Contact #  [phone]

## 2024-08-10 NOTE — Progress Notes (Signed)
 [facility] Progress Note  Jorge Sherman 83 y.o. 1941-09-16  CC: Concern for GI bleed   Subjective: Patient seen and examined at bedside.  Denies any active GI bleed.  Denies any GI symptoms.  ROS : Denies any nausea or vomiting.   Objective: Vital signs in last 24 hours: Vitals:   08/10/24 0352 08/10/24 0809  BP: (!) 146/71 (!) 149/92  Pulse: 81 79  Resp: 16 18  Temp: (!) 97.5 F (36.4 C) 98.1 F (36.7 C)  SpO2: 97% 93%    Physical Exam: Elderly patient, not in acute distress.  Abdominal exam benign.  Mood and affect normal.    Lab Results: Recent Labs    08/09/24 0407 08/10/24 0548  NA 139 132*  K 3.9 4.3  CL 104 100  CO2 29 23  GLUCOSE 89 91  BUN 18 13  CREATININE 1.09 0.96  CALCIUM  8.5* 8.7*   Recent Labs    08/08/24 1505 08/09/24 0407  AST 18 16  ALT 12 10  ALKPHOS 66 53  BILITOT 0.7 1.1  PROT 6.5 5.8*  ALBUMIN 3.4* 2.9*   Recent Labs    08/08/24 1505 08/09/24 0407 08/10/24 0548  WBC 1.9* 2.0* 2.1*  NEUTROABS 1.0*  --  1.0*  HGB 7.1* 7.6* 8.2*  HCT 23.1* 23.4* 25.0*  MCV 92.4 89.0 89.3  PLT 113* 108* 123*   Recent Labs    08/09/24 0407  LABPROT 15.1  INR 1.1      Assessment/Plan: -83 year old patient with anemia with concern for GI blood loss.  Recent EGD and colonoscopy at [facility] showed no evidence of active bleeding.   - Pancytopenia - Abnormal ultrasound at [facility] concerning for nodular contour of liver.  Normal INR.  Has chronic thrombocytopenia/pancytopenia.  Recommendations -------------------------- - Awaiting capsule endoscopy pictures to be downloaded  -Advance diet to soft -Further management based on capsule endoscopy findings.  Layla Lah MD, FACP 08/10/2024, 9:45 AM  Contact #  [phone]

## 2024-08-10 NOTE — Progress Notes (Signed)
 Givens Capsule Monitor removed @ 0411 & placed in Patient Belongings Bag (w/ Pt Label) per the Givens Capsule Inpatient Instruction Sheet.  Unit/Bag/Instruction sheet placed at bedside.  Diet resumed per Instructions (clear liquid). Pt in no acute distress. Will continue to monitor.

## 2024-08-10 NOTE — Plan of Care (Signed)
  Problem: Education: Goal: Knowledge of General Education information will improve Description: Including pain rating scale, medication(s)/side effects and non-pharmacologic comfort measures Outcome: Progressing   Problem: Coping: Goal: Level of anxiety will decrease Outcome: Progressing   Problem: Pain Managment: Goal: General experience of comfort will improve and/or be controlled Outcome: Progressing   Problem: Activity: Goal: Risk for activity intolerance will decrease Outcome: Not Progressing   Problem: Skin Integrity: Goal: Risk for impaired skin integrity will decrease Outcome: Not Progressing

## 2024-08-11 DIAGNOSIS — R0609 Other forms of dyspnea: Secondary | ICD-10-CM

## 2024-08-11 DIAGNOSIS — R5383 Other fatigue: Secondary | ICD-10-CM

## 2024-08-11 DIAGNOSIS — Z809 Family history of malignant neoplasm, unspecified: Secondary | ICD-10-CM

## 2024-08-11 DIAGNOSIS — R233 Spontaneous ecchymoses: Secondary | ICD-10-CM | POA: Diagnosis not present

## 2024-08-11 DIAGNOSIS — Z8249 Family history of ischemic heart disease and other diseases of the circulatory system: Secondary | ICD-10-CM

## 2024-08-11 DIAGNOSIS — Z79899 Other long term (current) drug therapy: Secondary | ICD-10-CM

## 2024-08-11 DIAGNOSIS — D649 Anemia, unspecified: Secondary | ICD-10-CM | POA: Diagnosis not present

## 2024-08-11 DIAGNOSIS — R55 Syncope and collapse: Secondary | ICD-10-CM

## 2024-08-11 DIAGNOSIS — Z823 Family history of stroke: Secondary | ICD-10-CM

## 2024-08-11 DIAGNOSIS — D61818 Other pancytopenia: Secondary | ICD-10-CM | POA: Diagnosis not present

## 2024-08-11 DIAGNOSIS — Z833 Family history of diabetes mellitus: Secondary | ICD-10-CM

## 2024-08-11 LAB — COMPREHENSIVE METABOLIC PANEL WITH GFR
ALT: 9 U/L (ref 0–44)
AST: 15 U/L (ref 15–41)
Albumin: 3.1 g/dL — ABNORMAL LOW (ref 3.5–5.0)
Alkaline Phosphatase: 63 U/L (ref 38–126)
Anion gap: 8 (ref 5–15)
BUN: 14 mg/dL (ref 8–23)
CO2: 28 mmol/L (ref 22–32)
Calcium: 8.9 mg/dL (ref 8.9–10.3)
Chloride: 99 mmol/L (ref 98–111)
Creatinine, Ser: 1.03 mg/dL (ref 0.61–1.24)
GFR, Estimated: >60 mL/min (ref >60)
Glucose, Bld: 100 mg/dL — ABNORMAL HIGH (ref 70–99)
Potassium: 4 mmol/L (ref 3.5–5.1)
Sodium: 135 mmol/L (ref 135–145)
Total Bilirubin: 0.8 mg/dL (ref 0.0–1.2)
Total Protein: 6.1 g/dL — ABNORMAL LOW (ref 6.5–8.1)

## 2024-08-11 LAB — CBC WITH DIFFERENTIAL/PLATELET
Abs Immature Granulocytes: 0.02 K/uL (ref 0.00–0.07)
Basophils Absolute: 0 K/uL (ref 0.0–0.1)
Basophils Relative: 1 %
Eosinophils Absolute: 0 K/uL (ref 0.0–0.5)
Eosinophils Relative: 1 %
HCT: 24.2 % — ABNORMAL LOW (ref 39.0–52.0)
Hemoglobin: 7.8 g/dL — ABNORMAL LOW (ref 13.0–17.0)
Immature Granulocytes: 1 %
Lymphocytes Relative: 58 %
Lymphs Abs: 1.2 K/uL (ref 0.7–4.0)
MCH: 28.6 pg (ref 26.0–34.0)
MCHC: 32.2 g/dL (ref 30.0–36.0)
MCV: 88.6 fL (ref 80.0–100.0)
Monocytes Absolute: 0.2 K/uL (ref 0.1–1.0)
Monocytes Relative: 8 %
Neutro Abs: 0.6 K/uL — ABNORMAL LOW (ref 1.7–7.7)
Neutrophils Relative %: 31 %
Platelets: 127 K/uL — ABNORMAL LOW (ref 150–400)
RBC: 2.73 MIL/uL — ABNORMAL LOW (ref 4.22–5.81)
RDW: 15.4 % (ref 11.5–15.5)
Smear Review: NORMAL
WBC: 2.1 K/uL — ABNORMAL LOW (ref 4.0–10.5)
nRBC: 0 % (ref 0.0–0.2)

## 2024-08-11 LAB — MAGNESIUM: Magnesium: 1.9 mg/dL (ref 1.7–2.4)

## 2024-08-11 LAB — PHOSPHORUS: Phosphorus: 4.2 mg/dL (ref 2.5–4.6)

## 2024-08-11 NOTE — Plan of Care (Signed)
  Problem: Education: Goal: Knowledge of General Education information will improve Description: Including pain rating scale, medication(s)/side effects and non-pharmacologic comfort measures Outcome: Progressing   Problem: Activity: Goal: Risk for activity intolerance will decrease Outcome: Not Progressing   Problem: Coping: Goal: Level of anxiety will decrease Outcome: Not Progressing   Problem: Pain Managment: Goal: General experience of comfort will improve and/or be controlled Outcome: Not Progressing   Problem: Skin Integrity: Goal: Risk for impaired skin integrity will decrease Outcome: Not Progressing

## 2024-08-11 NOTE — TOC Initial Note (Signed)
 Transition of Care [facility]) - Initial/Assessment Note    Patient Details  Name: Jorge Sherman MRN: [id number] Date of Birth: 14-Aug-1941  [facility] CM/SW Contact:    Gwenn Julien [place], [place] Phone Number: 08/11/2024, 10:47 AM  Clinical Narrative:   Pt admitted from [name] where he is a LTC/SNF resident. Spoke to Soy at [employer] who confirmed pt is able to return. Voicemail left for pt's dtr [name] requesting return call to confirm dc plan. SW will follow.   Julien Gwenn, MSW, LCSW [phone] (coverage)                  Expected Discharge Plan: Skilled Nursing Facility Barriers to Discharge: Continued Medical Work up   Patient Goals and CMS Choice            Expected Discharge Plan and Services     Post Acute Care Choice: Skilled Nursing Facility Living arrangements for the past 2 months: Skilled Nursing Facility                                      Prior Living Arrangements/Services Living arrangements for the past 2 months: Skilled Nursing Facility Lives with:: Facility Resident Patient language and need for interpreter reviewed:: Yes        Need for Family Participation in Patient Care: Yes (Comment) Care giver support system in place?: Yes (comment)   Criminal Activity/Legal Involvement Pertinent to Current Situation/Hospitalization: No - Comment as needed  Activities of Daily Living   ADL Screening (condition at time of admission) Independently performs ADLs?: No Does the patient have a NEW difficulty with bathing/dressing/toileting/self-feeding that is expected to last >3 days?: No Does the patient have a NEW difficulty with getting in/out of bed, walking, or climbing stairs that is expected to last >3 days?: No Does the patient have a NEW difficulty with communication that is expected to last >3 days?: No Is the patient deaf or have difficulty hearing?: Yes Does the patient have difficulty seeing, even when wearing  glasses/contacts?: Yes Does the patient have difficulty concentrating, remembering, or making decisions?: No  Permission Sought/Granted Permission sought to share information with : Facility Medical sales representative                Emotional Assessment       Orientation: : Oriented to Self, Oriented to Place, Oriented to  Time, Oriented to Situation, Fluctuating Orientation (Suspected and/or reported Sundowners) Alcohol  / Substance Use: Not Applicable Psych Involvement: No (comment)  Admission diagnosis:  Symptomatic anemia [D64.9] Patient Active Problem List   Diagnosis Date Noted   Symptomatic anemia 08/08/2024   Acute respiratory failure with hypercapnia (HCC) 02/14/2021   Bilious emesis 02/14/2021   Acute hypercapnic respiratory failure (HCC) 02/14/2021   Hyponatremia 11/11/2020   Falls 10/30/2020   Dehydration with hyponatremia 10/29/2020   Fall at home, initial encounter 10/29/2020   Musculoskeletal chest pain 10/29/2020   Mixed hyperlipidemia due to type 2 diabetes mellitus (HCC) 10/29/2020   Benign prostatic hyperplasia without lower urinary tract symptoms 10/29/2020   Ileus (HCC)    Declining functional status    Goals of care, counseling/discussion    Palliative care by specialist    AKI (acute kidney injury) (HCC) 06/27/2020   Acute diarrhea 06/27/2020   Altered mental status 08/28/2019   Septic joint of left knee joint (HCC) 04/22/2019   Erectile dysfunction 10/30/2013   Essential  hypertension, benign 10/30/2013   Coronary artery disease involving native coronary artery of native heart without angina pectoris 10/30/2013   Dizziness and giddiness    Pain in joint, lower leg    Type 2 diabetes mellitus without complication, with long-term current use of insulin  (HCC)    Obstructive sleep apnea    Depressive disorder, not elsewhere classified    Essential and other specified forms of tremor    Headache    Spinal stenosis, lumbar region, without neurogenic  claudication    Thoracic or lumbosacral neuritis or radiculitis, unspecified    Chronic pain syndrome    PCP:  [name] LABOR, MD Pharmacy:   [facility] - [place], [place] - [address] [place] [address] Phone: [phone] Fax: [phone]     Social Drivers of Health (SDOH) Social History: SDOH Screenings   Food Insecurity: No Food Insecurity (08/09/2024)  Housing: Low Risk  (08/08/2024)  Transportation Needs: No Transportation Needs (08/09/2024)  Utilities: Not At Risk (08/09/2024)  Financial Resource Strain: Low Risk  (11/01/2022)   Received from [facility]  Social Connections: Socially Isolated (08/09/2024)  Stress: No Stress Concern Present (11/01/2022)   Received from [facility]  Tobacco Use: Medium Risk (08/08/2024)   SDOH Interventions:     Readmission Risk Interventions     No data to display

## 2024-08-11 NOTE — Consult Note (Signed)
 [facility] [phone]   [phone]  CONSULT NOTE  REFERRING PHYSICIAN: Dr. Meliton Monte  REASON FOR CONSULTATION:  83 years old white male with pancytopenia  HPI Jorge Sherman is a 83 y.o. male.   HPI Discussed the use of AI scribe software for clinical note transcription with the patient, who gave verbal consent to proceed. The patient is an 83 year old with pancytopenia who presents with low blood counts and fatigue.  He was admitted to the hospital with a hemoglobin level of 6.8 on August 08, 2024, and has been experiencing shortness of breath. He has a history of syncope about a month ago, which led to his initial hospital visit. During that visit, an upper endoscopy was performed on June 25, 2024, and again on July 26, 2024, both of which were unremarkable. A planned capsule endoscopy was not completed. He received one unit of blood during his current admission, which temporarily improved his hemoglobin level, but it has since decreased again.  His CBC on admission showed a total white blood count of 1.9 with an absolute neutrophil count of 1000, hemoglobin of 7.1, hematocrit of 23.1, and platelet count of 113,000. Iron studies revealed normal serum iron with elevated iron saturation at 89% and elevated ferritin at 591. B12 level was elevated at 1823, and serum folate was over 20. TSH was normal, and reticulocyte count was slightly elevated at 3.4%. A repeat CBC today showed persistent leukocytopenia.  He resides in a skilled nursing facility where blood samples are taken every few days. He feels weak and fatigued when his blood levels are low.  His past medical history includes chronic pain, osteoarthritis, septic left knee joint, depression, hypertension, and cardiovascular disease. He has recently started taking Lyrica . 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 He quit smoking 30 to 40 years ago and denies any current use of street drugs.   Social History:  - Tobacco: Former smoker (quit 30-40 years ago) - Alcohol : Used to consume alcohol  frequently due to job-related entertaining. - Illicit Substances: Illicit drugs (Denies use of illicit drugs.) - Employment: Warehouse manager at [employer] - Partner Status: Not married - Living Situation: Resides in a nursing facility - Has three children, two of whom live nearby and one in [place]. Formerly traveled extensively for work.     Past Medical History:  Diagnosis Date   Chronic pain    Depressive disorder, not elsewhere classified    Dizziness and giddiness    Essential and other specified forms of tremor    Headache(784.0)    Leukocytosis, unspecified    Obstructive sleep apnea (adult) (pediatric)    Pain in joint, lower leg    Septic joint of left knee joint (HCC) 04/22/2019   Spinal stenosis, lumbar region, without neurogenic claudication    Thoracic or lumbosacral neuritis or radiculitis, unspecified    Type II or unspecified type diabetes mellitus without mention of complication, not stated as uncontrolled    Unspecified cardiovascular disease    Unspecified essential hypertension       Past Surgical History:  Procedure Laterality Date   BACK SURGERY     back surgey     times 3   IR INJECT/THERA/INC NEEDLE/CATH/PLC EPI/LUMB/SAC W/IMG  11/26/2020    Family History  Problem Relation Age of Onset   Cancer Father    Heart disease Father  Stroke Mother    Diabetes Mother     Social History Social History   Tobacco Use   Smoking status: Former   Smokeless tobacco: Never  Advertising account planner   Vaping status: Never Used  Substance Use Topics   Alcohol  use: No   Drug use: No    Allergies  Allergen Reactions   Cefepime Rash    Redness   Iohexol  Hives and Other (See Comments)    A few hives post CT   Fentanyl  Itching, Nausea  And Vomiting and Other (See Comments)    Patient states that it happened over 30 yrs ago   Iodine Hives and Rash    Current Facility-Administered Medications  Medication Dose Route Frequency Provider Last Rate Last Admin   0.9 %  sodium chloride  infusion   Intravenous Continuous Rosalie Kitchens, MD       acetaminophen  (TYLENOL ) tablet 650 mg  650 mg Oral Q6H PRN Drusilla Sabas RAMAN, MD   650 mg at 08/11/24 9353   atorvastatin  (LIPITOR ) tablet 80 mg  80 mg Oral Daily Perri DELENA Meliton Mickey., MD   80 mg at 08/11/24 1004   buprenorphine  (BUTRANS ) 10 MCG/HR 2 patch  2 patch Transdermal Weekly Perri DELENA Meliton Mickey., MD   2 patch at 08/09/24 0844   busPIRone  (BUSPAR ) tablet 15 mg  15 mg Oral TID Perri DELENA Meliton Mickey., MD   15 mg at 08/11/24 1004   DULoxetine  (CYMBALTA ) DR capsule 60 mg  60 mg Oral Daily Perri DELENA Meliton Mickey., MD   60 mg at 08/11/24 1004   ferrous sulfate  tablet 325 mg  325 mg Oral QODAY Powell, A Caldwell Jr., MD   325 mg at 08/10/24 1057   linaclotide  (LINZESS ) capsule 145 mcg  145 mcg Oral QAC breakfast Perri DELENA Meliton Mickey., MD   145 mcg at 08/11/24 9362   LORazepam  (ATIVAN ) tablet 0.25 mg  0.25 mg Oral BID Perri DELENA Meliton Mickey., MD   0.25 mg at 08/11/24 1004   morphine  (MSIR) tablet 15 mg  15 mg Oral Q8H PRN Perri DELENA Meliton Mickey., MD   15 mg at 08/11/24 1004   pantoprazole  (PROTONIX ) injection 40 mg  40 mg Intravenous Q12H Perri DELENA Meliton Mickey., MD   40 mg at 08/11/24 1005   pregabalin  (LYRICA ) capsule 50 mg  50 mg Oral BID Perri DELENA Meliton Mickey., MD   50 mg at 08/11/24 1004   tamsulosin  (FLOMAX ) capsule 0.4 mg  0.4 mg Oral QPC supper Perri DELENA Meliton Mickey., MD   0.4 mg at 08/10/24 1811   traZODone  (DESYREL ) tablet 50 mg  50 mg Oral QHS Perri DELENA Meliton Mickey., MD   50 mg at 08/10/24 2151    Review of Systems  Constitutional: positive for fatigue Eyes: negative Ears, nose, mouth, throat, and face: negative Respiratory: positive for dyspnea on exertion Cardiovascular: positive  for syncope Gastrointestinal: negative Genitourinary:negative Integument/breast: negative Hematologic/lymphatic: positive for easy bruising Musculoskeletal:negative Neurological: negative Behavioral/Psych: negative Endocrine: negative Allergic/Immunologic: negative  Physical Exam  MJO:jozmu, healthy, no distress, well nourished, and well developed SKIN: skin color, texture, turgor are normal, no rashes or significant lesions HEAD: Normocephalic, No masses, lesions, tenderness or abnormalities EYES: normal, PERRLA, Conjunctiva are pink and non-injected EARS: External ears normal, Canals clear OROPHARYNX:no exudate, no erythema, and lips, buccal mucosa, and tongue normal  NECK: supple, no adenopathy, no JVD LYMPH:  no palpable lymphadenopathy, no hepatosplenomegaly LUNGS: clear to auscultation , and palpation HEART: regular rate & rhythm, no  murmurs, and no gallops ABDOMEN:abdomen soft, non-tender, normal bowel sounds, and no masses or organomegaly BACK: Back symmetric, no curvature., No CVA tenderness EXTREMITIES: Senile purpura NEURO: alert & oriented x 3 with fluent speech, no focal motor/sensory deficits  PERFORMANCE STATUS: ECOG 1  LABORATORY DATA: Lab Results  Component Value Date   WBC 2.1 (L) 08/11/2024   HGB 7.8 (L) 08/11/2024   HCT 24.2 (L) 08/11/2024   MCV 88.6 08/11/2024   PLT 127 (L) 08/11/2024    @LASTCHEM @  RADIOGRAPHIC STUDIES: No results found.  ASSESSMENT AND PLAN: Assessment and Plan Pancytopenia with persistent anemia, leukopenia, and thrombocytopenia Hemoglobin was 6.8 on admission, currently 7.1. White blood cell count is 1.9 with an absolute neutrophil count of 1000. Platelet count is 113,000. Iron studies show normal serum iron, elevated iron saturation at 89%, and elevated ferritin at 591. B12 level is elevated at 1823, and serum folate is over 20. TSH is normal. Reticulocyte count is slightly elevated at 3.4%. Differential diagnosis includes  bone marrow disorders such as leukemia, myelodysplastic syndrome, or myeloproliferative syndrome. Lyrica , recently started, could also be a contributing factor. - Order bone marrow biopsy to evaluate bone marrow function - Continue supportive care with PRBCs transfusion if his hemoglobin is less than 7.0 or platelets count less than 20,000. - I would not consider starting G-CSF for the neutropenia at this point unless the bone marrow is negative for hematologic disorder. - Schedule follow-up appointment at the cancer center if discharged before results are available Thank you for allowing me to participate in the care of Mr. Sherman, I will continue to follow-up the patient with you and assist in his management on as-needed basis.  The patient voices understanding of current disease status and treatment options and is in agreement with the current care plan.  All questions were answered. The patient knows to call the clinic with any problems, questions or concerns. We can certainly see the patient much sooner if necessary.  Thank you so much for allowing me to participate in the care of Jorge Sherman. I will continue to follow up the patient with you and assist in his care.   Disclaimer: This note was dictated with voice recognition software. Similar sounding words can inadvertently be transcribed and may not be corrected upon review.   Jorge Sherman August 11, 2024, 3:11 PM

## 2024-08-11 NOTE — Plan of Care (Signed)

## 2024-08-11 NOTE — Evaluation (Addendum)
 Physical Therapy Evaluation Patient Details Name: Jorge Sherman MRN: [id number] DOB: 24-Mar-1941 Today's Date: 08/11/2024  History of Present Illness  83 y.o. male presents to [facility] 08/08/24 with dizziness and admitted with symptomatic anemia. 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 Recent d/c w/ acute blood less anemia w/ upper GI bleed w/ melena. PMHx: chronic pain, RA, DM, muscle tension dysphonia, pancytopenia   Clinical Impression  Pt admitted from [name] where he had assist for ADLs and close-guarding to perform stand-pivot to power WC. Pt reported having PT/OT at the facility every day. Limited PT eval as pt had lunch delivered during the session and did not want to continue. Pt was able to roll to re-adjust with CGA for safety and use of bed rails. Pt reported history of L>R LE weakness, however, declined further assessment of strength. Recommend pt return to facility with all further PT needs to be met at next venue. Acute PT signing off. Please re-consult if there are changes in status.         If plan is discharge home, recommend the following: A little help with walking and/or transfers;A lot of help with bathing/dressing/bathroom;Assistance with cooking/housework;Assist for transportation;Help with stairs or ramp for entrance   Can travel by private vehicle   No    Equipment Recommendations None recommended by PT     Functional Status Assessment Patient has had a recent decline in their functional status and demonstrates the ability to make significant improvements in function in a reasonable and predictable amount of time.     Precautions / Restrictions Precautions Precautions: Fall Restrictions Weight Bearing Restrictions Per Provider Order: No      Mobility  Bed Mobility Overal bed mobility: Needs Assistance Bed Mobility: Rolling Rolling: Contact guard assist    General bed mobility comments: able to initiate rolling in the bed to  re-adjust with no assistance and use of bed rail, decline further mobility       Balance Overall balance assessment: Needs assistance       Pertinent Vitals/Pain Pain Assessment Pain Assessment: No/denies pain    Home Living Family/patient expects to be discharged to:: Skilled nursing facility (SNF/[facility] term care at [facility])    Additional Comments: receiving OT/PT at facility    Prior Function Prior Level of Function : Needs assist  Mobility Comments: pt reports always having staff close by for stand pivot to power WC ADLs Comments: assist for upper body dressing, assist for bathing on a shower seat, meals in dining hall or eat in room     Extremity/Trunk Assessment   Upper Extremity Assessment Upper Extremity Assessment: Defer to OT evaluation    Lower Extremity Assessment Lower Extremity Assessment: Generalized weakness (L>R weakness)       Communication   Communication Communication: No apparent difficulties    Cognition Arousal: Alert Behavior During Therapy: Agitated   PT - Cognitive impairments: No family/caregiver present to determine baseline    PT - Cognition Comments: Pt became agitated after lunch tray arrived and did not want to continue eval Following commands: Intact       Cueing Cueing Techniques: Verbal cues     General Comments General comments (skin integrity, edema, etc.): VSS on RA     PT Assessment All further PT needs can be met in the next venue of care  PT Problem List Decreased strength;Decreased activity tolerance;Decreased balance;Decreased mobility;Decreased cognition;Decreased knowledge of use of DME;Decreased safety awareness       PT Treatment Interventions  DME instruction;Functional mobility training;Therapeutic exercise;Therapeutic activities;Balance training;Neuromuscular re-education;Patient/family education    PT Goals (Current goals can be found in the Care Plan section)  Acute Rehab PT Goals Patient Stated  Goal: to go back home            AM-PAC PT 6 Clicks Mobility  Outcome Measure Help needed turning from your back to your side while in a flat bed without using bedrails?: A Little Help needed moving from lying on your back to sitting on the side of a flat bed without using bedrails?: A Lot Help needed moving to and from a bed to a chair (including a wheelchair)?: A Lot Help needed standing up from a chair using your arms (e.g., wheelchair or bedside chair)?: A Lot Help needed to walk in hospital room?: Total Help needed climbing 3-5 steps with a railing? : Total 6 Click Score: 11    End of Session   Activity Tolerance: Other (comment) (decreased interest in participating in therapy) Patient left: in bed;with call bell/phone within reach Nurse Communication: Mobility status PT Visit Diagnosis: Other abnormalities of gait and mobility (R26.89);Muscle weakness (generalized) (M62.81)    Time: 8677-8665 PT Time Calculation (min) (ACUTE ONLY): 12 min   Charges:   PT Evaluation $PT Eval Low Complexity: 1 Low   PT General Charges $$ ACUTE PT VISIT: 1 Visit        Kate ORN, PT, DPT Secure Chat Preferred  Rehab Office [phone]   [name] Wendolyn 08/11/2024, 3:52 PM

## 2024-08-11 NOTE — Progress Notes (Signed)
 PROGRESS NOTE    Jorge Sherman  FMW:[id number] DOB: Aug 13, 1941 DOA: 08/08/2024 PCP: [name] LABOR, MD  Chief Complaint  Patient presents with   Abnormal Lab    Hgb: 6.8 WBC: 1.6    Brief Narrative:   83 y.o. male with medical history significant of recent admissions due to GI bleeding, anemia, chronic pain, depression/anxiety, T2DM and multiple other medical issues here with complaints of dizziness and labs concerning for Hb 6.8.   He was admitted for symptomatic anemia.  Assessment & Plan:   Principal Problem:   Symptomatic anemia  Symptomatic Anemia Recently discharged after admission for acute blood loss anemia with upper GI bleed with melena.  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  EDG 9/5 showed normal esophagus and stomach as well as the duodenal bulb and 2nd part of duodenum.  Small bowel AVM's were suspected based on recent discharge summary.  GI recommended an outpatient capsule endoscopy on 9/10 (looks like he hasn't been able to get this). At discharge 9/8 Hb 9 On admission today, Hb was 7.1  Hb 8.2 today Transfused 1 unit BID PPI for now  GI consulted - capsule showed mild inflammation in the stomach and proximal duodenum, duodenal diverticulum, scattered areas of fair visualization (but no evidence of active bleeding) Inadequate retic.  Elevated b12, elevated folate.  Ferritin elevated.   Pancytopenia Neutropenia  New as of 2025 it appears Seen by hematology who recommended holding hydroxychloroquine .  Chronic liver disease thought Clarissa Laird possible etiology. Follow CBC with diff ANC 0.6 today Follow copper , zinc  levels Will consult heme today   Concern for Chronic Liver Disease US  with nodular contour, coarse echotexture, and increased echogenicity - possible chronic liver disease Follow INR - 1.1 (9/19)   Chronic Pain  Continue cymbalta , lyrica , morphine  Will hold muscle relaxer for now Was on butrans  patch  It looks like they adjusted his pain medicines, but he  was discharged on 60 cymbalta , 50 mg lyrica , morphine  15 mg q8 prn, butran 20 mcg/hr patch weekly    Anxiety  Depression Buspar  cymbalta  Ativan     History of RA Holding hydroxychloroquine     BPH  Flomax     Hx DM Recent A1c 01/2021 was 5.4 A1c likely unreliable with his anemia BG's reasonable    Moderate to Severe AS Follow with cards outpatient   Muscle Tension Dysphonia Seen by ENT 9/17 Follow outpatient with ENT/SLP (recommended [facility] follow up for voice therapy)    DVT prophylaxis: SCD Code Status: full Family Communication: none Disposition:   Status is: Inpatient Remains inpatient appropriate because: need to continue anemia workup    Consultants:  GI  Procedures:  Capsule endoscopy  Antimicrobials:  Anti-infectives (From admission, onward)    None       Subjective: No new complaints  Objective: Vitals:   08/10/24 1620 08/10/24 2024 08/11/24 0435 08/11/24 0800  BP: 132/75 (!) 138/56 132/73 126/86  Pulse: 77 81 79 78  Resp: 18     Temp: 98.2 F (36.8 C) 98.2 F (36.8 C) 98.1 F (36.7 C) (!) 97.5 F (36.4 C)  TempSrc:  Oral Oral Oral  SpO2: (!) 78% 97% 93% 97%  Weight:      Height:        Intake/Output Summary (Last 24 hours) at 08/11/2024 1537 Last data filed at 08/11/2024 0800 Gross per 24 hour  Intake 480 ml  Output 675 ml  Net -195 ml   Filed Weights   08/08/24 1743 08/08/24 1847 08/09/24 1603  Weight: 107 kg 97.3 kg 97.3 kg    Examination:  General: No acute distress. Cardiovascular: RRR Lungs: unlabored Neurological: Alert and oriented 3. Moves all extremities 4 with equal strength. Cranial nerves II through XII grossly intact. Extremities: No clubbing or cyanosis. No edema.   Data Reviewed: I have personally reviewed following labs and imaging studies  CBC: Recent Labs  Lab 08/08/24 1505 08/09/24 0407 08/10/24 0548 08/11/24 0529  WBC 1.9* 2.0* 2.1* 2.1*  NEUTROABS 1.0*  --  1.0* 0.6*  HGB 7.1* 7.6* 8.2* 7.8*   HCT 23.1* 23.4* 25.0* 24.2*  MCV 92.4 89.0 89.3 88.6  PLT 113* 108* 123* 127*    Basic Metabolic Panel: Recent Labs  Lab 08/08/24 1505 08/09/24 0407 08/10/24 0548 08/11/24 0529  NA 134* 139 132* 135  K 4.5 3.9 4.3 4.0  CL 100 104 100 99  CO2 26 29 23 28   GLUCOSE 136* 89 91 100*  BUN 24* 18 13 14   CREATININE 1.14 1.09 0.96 1.03  CALCIUM  8.7* 8.5* 8.7* 8.9  MG  --   --   --  1.9  PHOS  --   --   --  4.2    GFR: Estimated Creatinine Clearance: 63.6 mL/min (by C-G formula based on SCr of 1.03 mg/dL).  Liver Function Tests: Recent Labs  Lab 08/08/24 1505 08/09/24 0407 08/11/24 0529  AST 18 16 15   ALT 12 10 9   ALKPHOS 66 53 63  BILITOT 0.7 1.1 0.8  PROT 6.5 5.8* 6.1*  ALBUMIN 3.4* 2.9* 3.1*    CBG: No results for input(s): GLUCAP in the last 168 hours.   No results found for this or any previous visit (from the past 240 hours).       Radiology Studies: No results found.      Scheduled Meds:  atorvastatin   80 mg Oral Daily   buprenorphine   2 patch Transdermal Weekly   busPIRone   15 mg Oral TID   DULoxetine   60 mg Oral Daily   ferrous sulfate   325 mg Oral QODAY   linaclotide   145 mcg Oral QAC breakfast   LORazepam   0.25 mg Oral BID   pantoprazole  (PROTONIX ) IV  40 mg Intravenous Q12H   pregabalin   50 mg Oral BID   tamsulosin   0.4 mg Oral QPC supper   traZODone   50 mg Oral QHS   Continuous Infusions:  sodium chloride        LOS: 2 days    Time spent: over 30 min     Meliton Monte, MD [facility]   To contact the attending provider between 7A-7P or the covering provider during after hours 7P-7A, please log into the web site [email or web address] and access using universal [place] password for that web site. If you do not have the password, please call the hospital operator.  08/11/2024, 3:37 PM

## 2024-08-12 ENCOUNTER — Encounter (HOSPITAL_COMMUNITY): Payer: Self-pay | Admitting: Gastroenterology

## 2024-08-12 DIAGNOSIS — D649 Anemia, unspecified: Secondary | ICD-10-CM | POA: Diagnosis not present

## 2024-08-12 LAB — COMPREHENSIVE METABOLIC PANEL WITH GFR
ALT: 8 U/L (ref 0–44)
AST: 15 U/L (ref 15–41)
Albumin: 3 g/dL — ABNORMAL LOW (ref 3.5–5.0)
Alkaline Phosphatase: 58 U/L (ref 38–126)
Anion gap: 8 (ref 5–15)
BUN: 19 mg/dL (ref 8–23)
CO2: 24 mmol/L (ref 22–32)
Calcium: 8.5 mg/dL — ABNORMAL LOW (ref 8.9–10.3)
Chloride: 101 mmol/L (ref 98–111)
Creatinine, Ser: 1.17 mg/dL (ref 0.61–1.24)
GFR, Estimated: >60 mL/min (ref >60)
Glucose, Bld: 107 mg/dL — ABNORMAL HIGH (ref 70–99)
Potassium: 4 mmol/L (ref 3.5–5.1)
Sodium: 133 mmol/L — ABNORMAL LOW (ref 135–145)
Total Bilirubin: 0.8 mg/dL (ref 0.0–1.2)
Total Protein: 5.8 g/dL — ABNORMAL LOW (ref 6.5–8.1)

## 2024-08-12 LAB — CBC WITH DIFFERENTIAL/PLATELET
Abs Immature Granulocytes: 0.02 K/uL (ref 0.00–0.07)
Basophils Absolute: 0 K/uL (ref 0.0–0.1)
Basophils Relative: 0 %
Eosinophils Absolute: 0.1 K/uL (ref 0.0–0.5)
Eosinophils Relative: 3 %
HCT: 23.9 % — ABNORMAL LOW (ref 39.0–52.0)
Hemoglobin: 7.7 g/dL — ABNORMAL LOW (ref 13.0–17.0)
Immature Granulocytes: 1 %
Lymphocytes Relative: 52 %
Lymphs Abs: 1.2 K/uL (ref 0.7–4.0)
MCH: 28.8 pg (ref 26.0–34.0)
MCHC: 32.2 g/dL (ref 30.0–36.0)
MCV: 89.5 fL (ref 80.0–100.0)
Monocytes Absolute: 0.2 K/uL (ref 0.1–1.0)
Monocytes Relative: 7 %
Neutro Abs: 0.8 K/uL — ABNORMAL LOW (ref 1.7–7.7)
Neutrophils Relative %: 37 %
Platelets: 114 K/uL — ABNORMAL LOW (ref 150–400)
RBC: 2.67 MIL/uL — ABNORMAL LOW (ref 4.22–5.81)
RDW: 15.5 % (ref 11.5–15.5)
Smear Review: NORMAL
WBC: 2.2 K/uL — ABNORMAL LOW (ref 4.0–10.5)
nRBC: 0.9 % — ABNORMAL HIGH (ref 0.0–0.2)

## 2024-08-12 LAB — PHOSPHORUS: Phosphorus: 4.3 mg/dL (ref 2.5–4.6)

## 2024-08-12 LAB — MAGNESIUM: Magnesium: 1.8 mg/dL (ref 1.7–2.4)

## 2024-08-12 LAB — PATHOLOGIST SMEAR REVIEW

## 2024-08-12 MED ORDER — ONDANSETRON HCL 4 MG/2ML IJ SOLN
4.0000 mg | Freq: Once | INTRAMUSCULAR | Status: DC
  Filled 2024-08-12: qty 2

## 2024-08-12 NOTE — Evaluation (Signed)
 Occupational Therapy Evaluation Patient Details Name: Jorge Sherman MRN: [id number] DOB: [birth year] Today's Date: 08/12/2024   History of Present Illness   83 y.o. male presents to [facility] 08/08/24 with dizziness and admitted with symptomatic anemia. 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 Recent d/c w/ acute blood less anemia w/ upper GI bleed w/ melena. PMHx: chronic pain, RA, DM, muscle tension dysphonia, pancytopenia     Clinical Impressions Jorge Sherman was evaluated s/p the above admission list. He is mod I for SP transfers at baseline but needs assist from staff for most ADLs. Pt states he feels like he is getting a little weaker acutely. Upon evaluation the pt was limited by generalized weakness, peri area soreness, unsteady balance and limited activity tolerance. Overall he needed CGA to stand and take pivotal steps from the bed to the recliner with the RW. Due to the deficits listed below the pt also needs up to mod A for LB ADLs and min A for UB ADLs. Pt will benefit from continued acute OT services and d/c back to skilled inpatient follow up therapy, <3 hours/day.     If plan is discharge home, recommend the following:   A little help with walking and/or transfers;A little help with bathing/dressing/bathroom;Assistance with cooking/housework;Assist for transportation;Help with stairs or ramp for entrance     Functional Status Assessment   Patient has had a recent decline in their functional status and demonstrates the ability to make significant improvements in function in a reasonable and predictable amount of time.     Equipment Recommendations   None recommended by OT      Precautions/Restrictions   Precautions Precautions: Fall Restrictions Weight Bearing Restrictions Per Provider Order: No     Mobility Bed Mobility Overal bed mobility: Needs Assistance Bed Mobility: Supine to Sit Rolling: Supervision              Transfers Overall  transfer level: Needs assistance Equipment used: Rolling walker (2 wheels) Transfers: Sit to/from Stand, Bed to chair/wheelchair/BSC Sit to Stand: Contact guard assist     Step pivot transfers: Contact guard assist     General transfer comment: from bed>chair      Balance Overall balance assessment: Needs assistance   Sitting balance-Leahy Scale: Fair     Standing balance support: Bilateral upper extremity supported, During functional activity Standing balance-Leahy Scale: Poor         ADL either performed or assessed with clinical judgement   ADL Overall ADL's : Needs assistance/impaired Eating/Feeding: Independent   Grooming: Set up;Sitting   Upper Body Bathing: Minimal assistance;Sitting   Lower Body Bathing: Moderate assistance;Sit to/from stand   Upper Body Dressing : Minimal assistance;Sitting   Lower Body Dressing: Moderate assistance;Sit to/from stand   Toilet Transfer: Contact guard assist;Stand-pivot;Rolling walker (2 wheels)   Toileting- Clothing Manipulation and Hygiene: Contact guard assist;Sitting/lateral lean;Sit to/from stand       Functional mobility during ADLs: Contact guard assist;Rolling walker (2 wheels) General ADL Comments: likely near baseline but pt states he feels like he is getting a little weaker     Vision Baseline Vision/History: 1 Wears glasses Vision Assessment?: No apparent visual deficits     Perception Perception: Within Functional Limits       Praxis Praxis: WFL       Pertinent Vitals/Pain Pain Assessment Pain Assessment: Faces Faces Pain Scale: Hurts a little bit Pain Location: bottom Pain Descriptors / Indicators: Discomfort Pain Intervention(s): Monitored during session, Limited activity within patient's tolerance  Extremity/Trunk Assessment Upper Extremity Assessment Upper Extremity Assessment: RUE deficits/detail;LUE deficits/detail RUE Deficits / Details: limited shoulder ROM, generally weak but able  to use functionally for mobility and ADls LUE Deficits / Details: limited overhead ROM   Lower Extremity Assessment Lower Extremity Assessment: Defer to PT evaluation   Cervical / Trunk Assessment Cervical / Trunk Assessment: Kyphotic   Communication Communication Communication: No apparent difficulties   Cognition Arousal: Alert Behavior During Therapy: WFL for tasks assessed/performed Cognition: No apparent impairments           Following commands: Intact       Cueing  General Comments   Cueing Techniques: Verbal cues  VSS on RA           Home Living Family/patient expects to be discharged to:: Skilled nursing facility           Additional Comments: receiving OT/PT at facility      Prior Functioning/Environment Prior Level of Function : Needs assist             Mobility Comments: mod I SP but states staff is normally close by for all transfers ADLs Comments: staff assists for bathing and dressing, pt is mod I for toileting    OT Problem List: Decreased strength;Decreased range of motion;Decreased activity tolerance;Impaired balance (sitting and/or standing);Decreased safety awareness;Decreased knowledge of use of DME or AE;Decreased knowledge of precautions   OT Treatment/Interventions: Self-care/ADL training;Therapeutic exercise;DME and/or AE instruction;Therapeutic activities;Patient/family education;Balance training      OT Goals(Current goals can be found in the care plan section)   Acute Rehab OT Goals Patient Stated Goal: to get stonger OT Goal Formulation: With patient Time For Goal Achievement: 08/26/24 Potential to Achieve Goals: Good ADL Goals Pt Will Perform Lower Body Dressing: with min assist;sit to/from stand Pt Will Transfer to Toilet: with modified independence Additional ADL Goal #1: pt will tolerate standing for >3 minutes with LRAD as a precursor to OOB ADLs   OT Frequency:  Min 1X/week    Co-evaluation               AM-PAC OT 6 Clicks Daily Activity     Outcome Measure Help from another person eating meals?: None Help from another person taking care of personal grooming?: A Little Help from another person toileting, which includes using toliet, bedpan, or urinal?: A Little Help from another person bathing (including washing, rinsing, drying)?: A Lot Help from another person to put on and taking off regular upper body clothing?: A Little Help from another person to put on and taking off regular lower body clothing?: A Lot 6 Click Score: 17   End of Session Equipment Utilized During Treatment: Rolling walker (2 wheels) Nurse Communication: Mobility status  Activity Tolerance: Patient tolerated treatment well Patient left: with call bell/phone within reach;in chair;with chair alarm set  OT Visit Diagnosis: Unsteadiness on feet (R26.81);Other abnormalities of gait and mobility (R26.89);Muscle weakness (generalized) (M62.81)                Time: 8681-8664 OT Time Calculation (min): 17 min Charges:  OT General Charges $OT Visit: 1 Visit OT Evaluation $OT Eval Low Complexity: 1 Low  Lucie Kendall, OTR/L Acute Rehabilitation Services Office [phone] Secure Chat Communication Preferred   Lucie JONETTA Kendall 08/12/2024, 2:46 PM

## 2024-08-12 NOTE — Plan of Care (Signed)
  Problem: Education: Goal: Knowledge of General Education information will improve Description: Including pain rating scale, medication(s)/side effects and non-pharmacologic comfort measures Outcome: Not Progressing   Problem: Activity: Goal: Risk for activity intolerance will decrease Outcome: Not Progressing   Problem: Coping: Goal: Level of anxiety will decrease Outcome: Not Progressing   Problem: Pain Managment: Goal: General experience of comfort will improve and/or be controlled Outcome: Not Progressing   Problem: Skin Integrity: Goal: Risk for impaired skin integrity will decrease Outcome: Not Progressing

## 2024-08-12 NOTE — Plan of Care (Signed)

## 2024-08-12 NOTE — Progress Notes (Signed)
 PROGRESS Sherman    Jorge Sherman  FMW:[id number] DOB: [birth year] DOA: 08/08/2024 PCP: Jorge Sherman LABOR, MD  Chief Complaint  Patient presents with   Abnormal Lab    Hgb: 6.8 WBC: 1.6    Brief Narrative:   83 y.o. male with medical history significant of recent admissions due to GI bleeding, anemia, chronic pain, depression/anxiety, T2DM and multiple other medical issues here with complaints of dizziness and labs concerning for Hb 6.8.   He was admitted for symptomatic anemia.  Assessment & Plan:   Principal Problem:   Symptomatic anemia  Symptomatic Anemia Recently discharged after admission for acute blood loss anemia with upper GI bleed with melena.  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  EDG 9/5 showed normal esophagus and stomach as well as the duodenal bulb and 2nd part of duodenum.  Small bowel AVM's were suspected based on recent discharge summary.  GI recommended an outpatient capsule endoscopy on 9/10 (looks like he hasn't been able to get this). At discharge 9/8 Hb 9 On admission today, Hb was 7.1  Hb 7.7 today Transfused 1 unit BID PPI for now  GI consulted - capsule showed mild inflammation in the stomach and proximal duodenum, duodenal diverticulum, scattered areas of fair visualization (but no evidence of active bleeding) Inadequate retic.  Elevated b12, elevated folate.  Ferritin elevated.   Pancytopenia Neutropenia  New as of 2025 it appears Seen by hematology at OSH who recommended holding hydroxychloroquine .  Chronic liver disease thought Jorge Sherman possible etiology. Follow CBC with diff ANC 0.8 today Follow copper , zinc  levels Appreciate oncology follow up 9/21 (see Jorge Sherman).  Planning for bone marrow biopsy 9/24.  Will need outpatient follow up.    Concern for Chronic Liver Disease US  with nodular contour, coarse echotexture, and increased echogenicity - possible chronic liver disease Follow INR - 1.1 (9/19)   Chronic Pain  Continue cymbalta , lyrica ,  morphine  Will hold muscle relaxer for now Was on butrans  patch  It looks like they adjusted his pain medicines, but he was discharged on 60 cymbalta , 50 mg lyrica , morphine  15 mg q8 prn, butran 20 mcg/hr patch weekly    Anxiety  Depression Buspar  cymbalta  Ativan     History of RA Holding hydroxychloroquine     BPH  Flomax     Hx DM Recent A1c 01/2021 was 5.4 A1c likely unreliable with his anemia BG's reasonable    Moderate to Severe AS Follow with cards outpatient   Muscle Tension Dysphonia Seen by ENT 9/17 Follow outpatient with ENT/SLP (recommended [facility] follow up for voice therapy)    DVT prophylaxis: SCD Code Status: full Family Communication: none Disposition:   Status is: Inpatient Remains inpatient appropriate because: need to continue anemia workup    Consultants:  GI  Procedures:  Capsule endoscopy  Antimicrobials:  Anti-infectives (From admission, onward)    None       Subjective: No new complaints  Objective: Vitals:   08/11/24 2035 08/12/24 0409 08/12/24 0751 08/12/24 1148  BP: 114/60 (!) 116/59 137/73 129/60  Pulse: 70 88 90 78  Resp: 18     Temp: 97.9 F (36.6 C) 97.6 F (36.4 C) 98.3 F (36.8 C) 98.6 F (37 C)  TempSrc: Oral     SpO2: 99% 93% 98% 97%  Weight:      Height:        Intake/Output Summary (Last 24 hours) at 08/12/2024 1428 Last data filed at 08/12/2024 0645 Gross per 24 hour  Intake --  Output 1075  ml  Net -1075 ml   Filed Weights   08/08/24 1743 08/08/24 1847 08/09/24 1603  Weight: 107 kg 97.3 kg 97.3 kg    Examination:  General: No acute distress. Cardiovascular: RRR Lungs: unlabored  Abdomen: Soft, nontender, nondistended  Neurological: Alert and oriented 3. Moves all extremities 4. Cranial nerves II through XII grossly intact. Extremities: No clubbing or cyanosis. No edema.  Data Reviewed: I have personally reviewed following labs and imaging studies  CBC: Recent Labs  Lab 08/08/24 1505  08/09/24 0407 08/10/24 0548 08/11/24 0529 08/12/24 0437  WBC 1.9* 2.0* 2.1* 2.1* 2.2*  NEUTROABS 1.0*  --  1.0* 0.6* 0.8*  HGB 7.1* 7.6* 8.2* 7.8* 7.7*  HCT 23.1* 23.4* 25.0* 24.2* 23.9*  MCV 92.4 89.0 89.3 88.6 89.5  PLT 113* 108* 123* 127* 114*    Basic Metabolic Panel: Recent Labs  Lab 08/08/24 1505 08/09/24 0407 08/10/24 0548 08/11/24 0529 08/12/24 0437  NA 134* 139 132* 135 133*  K 4.5 3.9 4.3 4.0 4.0  CL 100 104 100 99 101  CO2 26 29 23 28 24   GLUCOSE 136* 89 91 100* 107*  BUN 24* 18 13 14 19   CREATININE 1.14 1.09 0.96 1.03 1.17  CALCIUM  8.7* 8.5* 8.7* 8.9 8.5*  MG  --   --   --  1.9 1.8  PHOS  --   --   --  4.2 4.3    GFR: Estimated Creatinine Clearance: 56 mL/min (by C-G formula based on SCr of 1.17 mg/dL).  Liver Function Tests: Recent Labs  Lab 08/08/24 1505 08/09/24 0407 08/11/24 0529 08/12/24 0437  AST 18 16 15 15   ALT 12 10 9 8   ALKPHOS 66 53 63 58  BILITOT 0.7 1.1 0.8 0.8  PROT 6.5 5.8* 6.1* 5.8*  ALBUMIN 3.4* 2.9* 3.1* 3.0*    CBG: No results for input(s): GLUCAP in the last 168 hours.   No results found for this or any previous visit (from the past 240 hours).       Radiology Studies: No results found.      Scheduled Meds:  atorvastatin   80 mg Oral Daily   buprenorphine   2 patch Transdermal Weekly   busPIRone   15 mg Oral TID   DULoxetine   60 mg Oral Daily   ferrous sulfate   325 mg Oral QODAY   linaclotide   145 mcg Oral QAC breakfast   LORazepam   0.25 mg Oral BID   [START ON 08/14/2024] ondansetron  (ZOFRAN ) IV  4 mg Intravenous Once   pantoprazole  (PROTONIX ) IV  40 mg Intravenous Q12H   pregabalin   50 mg Oral BID   tamsulosin   0.4 mg Oral QPC supper   traZODone   50 mg Oral QHS   Continuous Infusions:  sodium chloride        LOS: 3 days    Time spent: over 30 min     Jorge Monte, MD [facility]   To contact the attending provider between 7A-7P or the covering provider during after hours 7P-7A,  please log into the web site [email or web address] and access using universal [place] password for that web site. If you do not have the password, please call the hospital operator.  08/12/2024, 2:28 PM

## 2024-08-12 NOTE — Consult Note (Signed)
 Chief Complaint: Patient was seen in consultation today for pancytopenia Chief Complaint  Patient presents with   Abnormal Lab    Hgb: 6.8 WBC: 1.6   at the request of [name] [name]   Referring Physician(s): [name] [name]   Supervising Physician: Jennefer Rover  Patient Status: [facility] - In-pt  History of Present Illness: Jorge Sherman is a 83 y.o. male with PMHs of anemia, depression/anxiety, DM, recurrent GIB and pancytopenia, IR was consulted for bone marrow biopsy.   Patient was recently discharged on 9/8 after admission for acute blood loss anemia with upper GI bleed with melena. Patient came back to ED on 9/18 due to abnormal lab with hgb in 6 and SOB, fatigue. CBC in ED showed pancytopenia with hgb 7.1, received 1 unit of blood. GI was consulted and patient underwent capsule endoscopy study. Hem/onc was consulted on 9/21 who recommended BMBx which patient decided to proceed.   Patient laying in bed, not in acute distress.  Denise headache, fever, chills, shortness of breath, cough, chest pain, abdominal pain, nausea ,vomiting, and bleeding.    Past Medical History:  Diagnosis Date   Chronic pain    Depressive disorder, not elsewhere classified    Dizziness and giddiness    Essential and other specified forms of tremor    Headache(784.0)    Leukocytosis, unspecified    Obstructive sleep apnea (adult) (pediatric)    Pain in joint, lower leg    Septic joint of left knee joint (HCC) 04/22/2019   Spinal stenosis, lumbar region, without neurogenic claudication    Thoracic or lumbosacral neuritis or radiculitis, unspecified    Type II or unspecified type diabetes mellitus without mention of complication, not stated as uncontrolled    Unspecified cardiovascular disease    Unspecified essential hypertension     Past Surgical History:  Procedure Laterality Date   BACK SURGERY     back surgey     times 3   GIVENS CAPSULE STUDY N/A 08/09/2024   Procedure: IMAGING  PROCEDURE, GI TRACT, INTRALUMINAL, VIA CAPSULE;  Surgeon: Rosalie Kitchens, MD;  Location: [facility] ENDOSCOPY;  Service: Gastroenterology;  Laterality: N/A;   IR INJECT/THERA/INC NEEDLE/CATH/PLC EPI/LUMB/SAC W/IMG  11/26/2020    Allergies: Cefepime, Iohexol , Fentanyl , and Iodine  Medications: Prior to Admission medications   Medication Sig Start Date End Date Taking? Authorizing Provider  acetaminophen  (TYLENOL ) 500 MG tablet Take 2 tablets (1,000 mg total) by mouth every 6 (six) hours as needed for headache, mild pain or fever. Patient taking differently: Take 1,000 mg by mouth every 8 (eight) hours as needed for headache, mild pain (pain score 1-3) or fever. 11/28/20  Yes Patsy Lenis, MD  albuterol  (VENTOLIN  HFA) 108 (90 Base) MCG/ACT inhaler Inhale 2 puffs into the lungs every 4 (four) hours as needed for wheezing or shortness of breath. 11/03/20  Yes Pokhrel, Laxman, MD  ascorbic acid  (VITAMIN C) 500 MG tablet Take 500 mg by mouth every morning.   Yes [provider]  atorvastatin  (LIPITOR ) 80 MG tablet Take 80 mg by mouth at bedtime. 12/14/20  Yes [provider]  busPIRone  (BUSPAR ) 15 MG tablet Take 15 mg by mouth 3 (three) times daily. 07/02/24  Yes [provider]  Cranberry 450 MG TABS Take 450 mg by mouth in the morning.   Yes [provider]  Cyanocobalamin 5000 MCG TBDP Place 5,000 mcg under the tongue every morning.   Yes [provider]  cyclobenzaprine (FLEXERIL) 10 MG tablet Take 10 mg by mouth  3 (three) times daily as needed for muscle spasms.   Yes [provider]  Docusate Sodium  (DSS) 100 MG CAPS Take 100 mg by mouth in the morning and at bedtime.   Yes [provider]  DULoxetine  (CYMBALTA ) 60 MG capsule Take 60 mg by mouth daily. 07/14/24  Yes [provider]  ferrous sulfate  325 (65 FE) MG tablet Take 325 mg by mouth daily.    Yes [provider]  fluticasone (FLONASE) 50 MCG/ACT nasal spray Place 1 spray  into the nose daily. 06/04/22  Yes [provider]  folic acid  (FOLVITE ) 400 MCG tablet Take 400 mcg by mouth every morning.   Yes [provider]  guaiFENesin (ROBITUSSIN) 100 MG/5ML liquid Take 10 mLs by mouth every 4 (four) hours as needed for cough.   Yes [provider]  hydroxychloroquine  (PLAQUENIL ) 200 MG tablet Take 200 mg by mouth 2 (two) times daily.   Yes [provider]  Hypromellose (NATURAL BALANCE TEARS OP) Place 2 drops into both eyes in the morning and at bedtime.   Yes [provider]  lidocaine  4 % Place 1 patch onto the skin in the morning. 07/29/24 10/27/24 Yes [provider]  LINZESS  145 MCG CAPS capsule Take 145 mcg by mouth daily. 07/11/24  Yes [provider]  loratadine (CLARITIN) 10 MG tablet Take 10 mg by mouth in the morning.   Yes [provider]  LORazepam  (ATIVAN ) 0.5 MG tablet Take 0.5 mg by mouth 2 (two) times daily. 07/02/24  Yes [provider]  melatonin 3 MG TABS tablet Take 3 mg by mouth at bedtime. 07/29/24 10/27/24 Yes [provider]  morphine  (MSIR) 15 MG tablet Take 15 mg by mouth every 8 (eight) hours as needed for severe pain (pain score 7-10).   Yes [provider]  Multiple Vitamin (MULTIVITAMIN WITH MINERALS) TABS tablet Take 1 tablet by mouth daily. 11/29/20  Yes Patsy Lenis, MD  ondansetron  (ZOFRAN ) 4 MG tablet Take 1 tablet (4 mg total) by mouth every 6 (six) hours as needed for nausea. 07/04/20  Yes Will Almarie MATSU, MD  OXYGEN  Inhale 2 L into the lungs continuous.   Yes [provider]  pantoprazole  (PROTONIX ) 40 MG tablet Take 1 tablet (40 mg total) by mouth daily. 11/29/20  Yes Patsy Lenis, MD  polyethylene glycol (MIRALAX  / GLYCOLAX ) 17 g packet Take 17 g by mouth daily as needed for mild constipation. Patient taking differently: Take 17 g by mouth daily. 11/03/20  Yes Pokhrel, Laxman, MD  pregabalin  (LYRICA ) 75 MG capsule Take 75 mg by  mouth 2 (two) times daily. 07/02/24  Yes [provider]  senna (SENOKOT) 8.6 MG tablet Take 2 tablets by mouth every 12 (twelve) hours as needed for constipation.   Yes [provider]  senna-docusate (SENOKOT-S) 8.6-50 MG tablet Take 2 tablets by mouth 2 (two) times daily. 11/11/22 09/29/24 Yes [provider]  SUMAtriptan  (IMITREX ) 50 MG tablet Take 50 mg by mouth every 2 (two) hours as needed for migraine or headache. 07/01/24  Yes [provider]  tamsulosin  (FLOMAX ) 0.4 MG CAPS capsule Take 0.4 mg by mouth every morning.   Yes [provider]  thiamine  100 MG tablet Take 1 tablet (100 mg total) by mouth daily. 02/25/21  Yes Cheryle Page, MD  traZODone  (DESYREL ) 50 MG tablet Take 50 mg by mouth at bedtime. 08/27/21 10/27/24 Yes [provider]     Family History  Problem Relation Age of  Onset   Cancer Father    Heart disease Father    Stroke Mother    Diabetes Mother     Social History   Socioeconomic History   Marital status: Legally Separated    Spouse name: Lonell   Number of children: 4   Years of education: Not on file   Highest education level: Not on file  Occupational History    Employer: DISABLED  Tobacco Use   Smoking status: Former   Smokeless tobacco: Never  Vaping Use   Vaping status: Never Used  Substance and Sexual Activity   Alcohol  use: No   Drug use: No   Sexual activity: Not Currently  Other Topics Concern   Not on file  Social History Narrative   ** Merged History Encounter **       Patient lives at home with wife.    Patient has 4 children.         Social Drivers of Corporate investment banker Strain: Low Risk  (11/01/2022)   [facility]   Overall Financial Resource Strain (CARDIA)    Difficulty of Paying Living Expenses: Not hard at all  Food Insecurity: No Food Insecurity (08/09/2024)   Hunger Vital Sign    Worried About Running Out of Food in the Last Year: Never true     Ran Out of Food in the Last Year: Never true  Transportation Needs: No Transportation Needs (08/09/2024)   PRAPARE - Administrator, Civil Service (Medical): No    Lack of Transportation (Non-Medical): No  Physical Activity: Not on file  Stress: No Stress Concern Present (11/01/2022)   Received from [facility] - Occupational Stress Questionnaire    Feeling of Stress : Not at all  Social Connections: Socially Isolated (08/09/2024)   Social Connection and Isolation Panel    Frequency of Communication with Friends and Family: More than three times a week    Frequency of Social Gatherings with Friends and Family: Three times a week    Attends Religious Services: Never    Active Member of Clubs or Organizations: No    Attends Banker Meetings: Never    Marital Status: Divorced     Review of Systems: A 12 point ROS discussed and pertinent positives are indicated in the HPI above.  All other systems are negative.  Vital Signs: BP 129/60 (BP Location: Right Arm)   Pulse 78   Temp 98.6 F (37 C)   Resp 18   Ht 5' 10 (1.778 m)   Wt 214 lb 8.1 oz (97.3 kg)   SpO2 97%   BMI 30.78 kg/m    Physical Exam Vitals and nursing note reviewed.  Constitutional:      General: Patient is not in acute distress.    Appearance: Normal appearance. Patient is not ill-appearing.  HENT:     Head: Normocephalic and atraumatic.     Mouth/Throat:     Mouth: Mucous membranes are moist.     Pharynx: Oropharynx is clear.  Cardiovascular:     Rate and Rhythm: Normal rate and regular rhythm.     Pulses: Normal pulses.     Heart sounds: Normal heart sounds.  Pulmonary:     Effort: Pulmonary effort is normal.     Breath sounds: Normal breath sounds.  Abdominal:     General: Abdomen is flat. Bowel sounds are normal.     Palpations: Abdomen is soft.  Musculoskeletal:     Cervical back: Neck supple.  Skin:    General: Skin is warm  and dry.     Coloration: Skin is not jaundiced or pale.  Neurological:     Mental Status: Patient is alert and oriented to person, place, and time.  Psychiatric:        Mood and Affect: Mood normal.        Behavior: Behavior normal.        Judgment: Judgment normal.    MD Evaluation Airway: WNL Heart: WNL Abdomen: WNL Chest/ Lungs: WNL ASA  Classification: 3 Mallampati/Airway Score: Two  Imaging: No results found.  Labs:  CBC: Recent Labs    08/09/24 0407 08/10/24 0548 08/11/24 0529 08/12/24 0437  WBC 2.0* 2.1* 2.1* 2.2*  HGB 7.6* 8.2* 7.8* 7.7*  HCT 23.4* 25.0* 24.2* 23.9*  PLT 108* 123* 127* 114*    COAGS: Recent Labs    08/09/24 0407  INR 1.1    BMP: Recent Labs    08/09/24 0407 08/10/24 0548 08/11/24 0529 08/12/24 0437  NA 139 132* 135 133*  K 3.9 4.3 4.0 4.0  CL 104 100 99 101  CO2 29 23 28 24   GLUCOSE 89 91 100* 107*  BUN 18 13 14 19   CALCIUM  8.5* 8.7* 8.9 8.5*  CREATININE 1.09 0.96 1.03 1.17  GFRNONAA >60 >60 >60 >60    LIVER FUNCTION TESTS: Recent Labs    08/08/24 1505 08/09/24 0407 08/11/24 0529 08/12/24 0437  BILITOT 0.7 1.1 0.8 0.8  AST 18 16 15 15   ALT 12 10 9 8   ALKPHOS 66 53 63 58  PROT 6.5 5.8* 6.1* 5.8*  ALBUMIN 3.4* 2.9* 3.1* 3.0*    TUMOR MARKERS: No results for input(s): AFPTM, CEA, CA199, CHROMGRNA in the last 8760 hours.  Assessment and Plan: 83 y.o. male with recurrent GIB and pancytopenia who is in need of BMBx.   VSS CBC with diff stable, hgb 7.7 plt 114 Allergic to Fentanyl , caused itchiness and N/V, happened 30+ years ago. - Discussed with Dr. Jennefer, will give patient Zofran  before administration of Fentanyl . Order signed and held.   Risks and benefits of BMBx was discussed with the patient and/or patient's family including, but not limited to bleeding, infection, damage to adjacent structures or low yield requiring additional tests.  All of the questions were answered and there is agreement to  proceed.  Consent signed and in chart.  The procedure was tentatively scheduled for Wednesday 8 am.   PLAN - NPO except meds on Wed 0 o'clock - CBC with diff on Wed AM   Thank you for this interesting consult.  I greatly enjoyed meeting CHANZ CAHALL and look forward to participating in their care.  A copy of this report was sent to the requesting provider on this date.  Electronically Signed: Toya VEAR Cousin, PA-C 08/12/2024, 1:14 PM   I spent a total of 40 Minutes    in face to face in clinical consultation, greater than 50% of which was counseling/coordinating care for BMBx.   This chart was dictated using voice recognition software.  Despite best efforts to proofread,  errors can occur which can change the documentation meaning.

## 2024-08-12 NOTE — TOC Progression Note (Signed)
 Transition of Care [facility]) - Progression Note    Patient Details  Name: Jorge Sherman MRN: [id number] Date of Birth: 1941-04-10  Transition of Care [facility]) CM/SW Contact  Sherline Clack, [place] Phone Number: 08/12/2024, 11:40 AM  Clinical Narrative:     CSW reached out to [name] to ask if patient could return to the facility when medically ready, and the facility said yes. CSW met with patient at bedside and he is agreeable to discharge to [facility]. CSW will continue to follow.   Expected Discharge Plan: Skilled Nursing Facility Barriers to Discharge: Continued Medical Work up               Expected Discharge Plan and Services     Post Acute Care Choice: Skilled Nursing Facility Living arrangements for the past 2 months: Skilled Nursing Facility                                       Social Drivers of Health (SDOH) Interventions SDOH Screenings   Food Insecurity: No Food Insecurity (08/09/2024)  Housing: Low Risk  (08/08/2024)  Transportation Needs: No Transportation Needs (08/09/2024)  Utilities: Not At Risk (08/09/2024)  Financial Resource Strain: Low Risk  (11/01/2022)   Received from [facility]  Social Connections: Socially Isolated (08/09/2024)  Stress: No Stress Concern Present (11/01/2022)   Received from [facility]  Tobacco Use: Medium Risk (08/08/2024)    Readmission Risk Interventions     No data to display

## 2024-08-12 NOTE — Care Management Important Message (Signed)
 Important Message  Patient Details  Name: Jorge Sherman MRN: [id number] Date of Birth: 09-19-1941   Important Message Given:        Claretta Deed 08/12/2024, 4:06 PM

## 2024-08-13 DIAGNOSIS — D649 Anemia, unspecified: Secondary | ICD-10-CM | POA: Diagnosis not present

## 2024-08-13 LAB — COMPREHENSIVE METABOLIC PANEL WITH GFR
ALT: 8 U/L (ref 0–44)
AST: 15 U/L (ref 15–41)
Albumin: 3 g/dL — ABNORMAL LOW (ref 3.5–5.0)
Alkaline Phosphatase: 63 U/L (ref 38–126)
Anion gap: 5 (ref 5–15)
BUN: 21 mg/dL (ref 8–23)
CO2: 26 mmol/L (ref 22–32)
Calcium: 8.5 mg/dL — ABNORMAL LOW (ref 8.9–10.3)
Chloride: 101 mmol/L (ref 98–111)
Creatinine, Ser: 1.14 mg/dL (ref 0.61–1.24)
GFR, Estimated: >60 mL/min (ref >60)
Glucose, Bld: 117 mg/dL — ABNORMAL HIGH (ref 70–99)
Potassium: 3.9 mmol/L (ref 3.5–5.1)
Sodium: 132 mmol/L — ABNORMAL LOW (ref 135–145)
Total Bilirubin: 0.7 mg/dL (ref 0.0–1.2)
Total Protein: 6.1 g/dL — ABNORMAL LOW (ref 6.5–8.1)

## 2024-08-13 LAB — CBC WITH DIFFERENTIAL/PLATELET
Abs Immature Granulocytes: 0.01 K/uL (ref 0.00–0.07)
Basophils Absolute: 0 K/uL (ref 0.0–0.1)
Basophils Relative: 0 %
Eosinophils Absolute: 0 K/uL (ref 0.0–0.5)
Eosinophils Relative: 2 %
HCT: 23.5 % — ABNORMAL LOW (ref 39.0–52.0)
Hemoglobin: 7.6 g/dL — ABNORMAL LOW (ref 13.0–17.0)
Immature Granulocytes: 1 %
Lymphocytes Relative: 53 %
Lymphs Abs: 1.1 K/uL (ref 0.7–4.0)
MCH: 29 pg (ref 26.0–34.0)
MCHC: 32.3 g/dL (ref 30.0–36.0)
MCV: 89.7 fL (ref 80.0–100.0)
Monocytes Absolute: 0.1 K/uL (ref 0.1–1.0)
Monocytes Relative: 7 %
Neutro Abs: 0.8 K/uL — ABNORMAL LOW (ref 1.7–7.7)
Neutrophils Relative %: 37 %
Platelets: 112 K/uL — ABNORMAL LOW (ref 150–400)
RBC: 2.62 MIL/uL — ABNORMAL LOW (ref 4.22–5.81)
RDW: 15.5 % (ref 11.5–15.5)
Smear Review: NORMAL
WBC: 2.1 K/uL — ABNORMAL LOW (ref 4.0–10.5)
nRBC: 0 % (ref 0.0–0.2)

## 2024-08-13 LAB — ZINC: Zinc: 54 ug/dL (ref 44–115)

## 2024-08-13 LAB — COPPER, SERUM: Copper: 122 ug/dL (ref 69–132)

## 2024-08-13 LAB — PREPARE RBC (CROSSMATCH)

## 2024-08-13 LAB — MAGNESIUM: Magnesium: 1.8 mg/dL (ref 1.7–2.4)

## 2024-08-13 LAB — PHOSPHORUS: Phosphorus: 4 mg/dL (ref 2.5–4.6)

## 2024-08-13 MED ORDER — SODIUM CHLORIDE 0.9% IV SOLUTION: Freq: Once | INTRAVENOUS | Status: AC

## 2024-08-13 NOTE — Progress Notes (Addendum)
 PROGRESS NOTE    Jorge Sherman  FMW:[id number] DOB: 09-13-1941 DOA: 08/08/2024 PCP: Jorge Sherman LABOR, MD  Chief Complaint  Patient presents with   Abnormal Lab    Hgb: 6.8 WBC: 1.6    Brief Narrative:   83 y.o. male with medical history significant of recent admissions due to GI bleeding, anemia, chronic pain, depression/anxiety, T2DM and multiple other medical issues here with complaints of dizziness and labs concerning for Hb 6.8.   He was admitted for symptomatic anemia.  S/p capsule study which was unrevealing for active bleeding.  Seen by oncology who is planning for bone marrow biopsy 9/24.   Assessment & Plan:   Principal Problem:   Symptomatic anemia  Symptomatic Anemia Recently discharged after admission for acute blood loss anemia with upper GI bleed with melena.  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  EDG 9/5 showed normal esophagus and stomach as well as the duodenal bulb and 2nd part of duodenum.  Small bowel AVM's were suspected based on recent discharge summary.  GI recommended an outpatient capsule endoscopy on 9/10 (looks like he hasn't been able to get this). At discharge 9/8 Hb 9 On admission today, Hb was 7.1  Hb 7.6 today - he c/o vague symptoms, possible this represents symptomatic anemia - will transfuse 1 unit prbc and follow  S/p 1 unit pRBC.  Will transfuse additional unit today. BID PPI for now  GI consulted - capsule showed mild inflammation in the stomach and proximal duodenum, duodenal diverticulum, scattered areas of fair visualization (but no evidence of active bleeding) Inadequate retic.  Elevated b12, elevated folate.  Ferritin elevated.   Pancytopenia Neutropenia  New as of 2025 it appears Seen by hematology at OSH who recommended holding hydroxychloroquine .  Chronic liver disease thought Jorge Sherman possible etiology. Follow CBC with diff ANC 0.8 today Follow copper , zinc  levels Appreciate oncology follow up 9/21 (see Jorge Sherman note).  Planning for bone  marrow biopsy 9/24.  Will need outpatient follow up.    Concern for Chronic Liver Disease US  with nodular contour, coarse echotexture, and increased echogenicity - possible chronic liver disease Follow INR - 1.1 (9/19)   Chronic Pain  Continue cymbalta , lyrica , morphine  Will hold muscle relaxer for now Was on butrans  patch  It looks like they adjusted his pain medicines, but he was discharged on 60 cymbalta , 50 mg lyrica , morphine  15 mg q8 prn, butran 20 mcg/hr patch weekly    Anxiety  Depression Buspar  cymbalta  Ativan     History of RA Holding hydroxychloroquine     BPH  Flomax     Hx DM Recent A1c 01/2021 was 5.4 A1c likely unreliable with his anemia BG's reasonable    Moderate to Severe AS Follow with cards outpatient   Muscle Tension Dysphonia Seen by ENT 9/17 Follow outpatient with ENT/SLP (recommended [facility] follow up for voice therapy)    DVT prophylaxis: SCD Code Status: full Family Communication: none Disposition:   Status is: Inpatient Remains inpatient appropriate because: need to continue anemia workup    Consultants:  GI  Procedures:  Capsule endoscopy  Antimicrobials:  Anti-infectives (From admission, onward)    None       Subjective: Feels poorly - like he does when Hb getting low   Objective: Vitals:   08/12/24 2042 08/12/24 2310 08/13/24 0457 08/13/24 0818  BP: 106/68 (!) 153/85 (!) 148/65 126/79  Pulse: 84 79 80 90  Resp: 18 18 17    Temp: 97.9 F (36.6 C) 98.3 F (36.8 C) 98.6  F (37 C) 98.3 F (36.8 C)  TempSrc:      SpO2: 99% 98% 97% 95%  Weight:      Height:        Intake/Output Summary (Last 24 hours) at 08/13/2024 1527 Last data filed at 08/13/2024 1010 Gross per 24 hour  Intake --  Output 400 ml  Net -400 ml   Filed Weights   08/08/24 1743 08/08/24 1847 08/09/24 1603  Weight: 107 kg 97.3 kg 97.3 kg    Examination:  General: No acute distress. Cardiovascular: RRR Lungs: unlabored Neurological: Alert and  oriented 3. Moves all extremities 4 with equal strength. Cranial nerves II through XII grossly intact. Extremities: No clubbing or cyanosis. No edema.   Data Reviewed: I have personally reviewed following labs and imaging studies  CBC: Recent Labs  Lab 08/08/24 1505 08/09/24 0407 08/10/24 0548 08/11/24 0529 08/12/24 0437 08/13/24 0510  WBC 1.9* 2.0* 2.1* 2.1* 2.2* 2.1*  NEUTROABS 1.0*  --  1.0* 0.6* 0.8* 0.8*  HGB 7.1* 7.6* 8.2* 7.8* 7.7* 7.6*  HCT 23.1* 23.4* 25.0* 24.2* 23.9* 23.5*  MCV 92.4 89.0 89.3 88.6 89.5 89.7  PLT 113* 108* 123* 127* 114* 112*    Basic Metabolic Panel: Recent Labs  Lab 08/09/24 0407 08/10/24 0548 08/11/24 0529 08/12/24 0437 08/13/24 0510  NA 139 132* 135 133* 132*  K 3.9 4.3 4.0 4.0 3.9  CL 104 100 99 101 101  CO2 29 23 28 24 26   GLUCOSE 89 91 100* 107* 117*  BUN 18 13 14 19 21   CREATININE 1.09 0.96 1.03 1.17 1.14  CALCIUM  8.5* 8.7* 8.9 8.5* 8.5*  MG  --   --  1.9 1.8 1.8  PHOS  --   --  4.2 4.3 4.0    GFR: Estimated Creatinine Clearance: 57.4 mL/min (by C-G formula based on SCr of 1.14 mg/dL).  Liver Function Tests: Recent Labs  Lab 08/08/24 1505 08/09/24 0407 08/11/24 0529 08/12/24 0437 08/13/24 0510  AST 18 16 15 15 15   ALT 12 10 9 8 8   ALKPHOS 66 53 63 58 63  BILITOT 0.7 1.1 0.8 0.8 0.7  PROT 6.5 5.8* 6.1* 5.8* 6.1*  ALBUMIN 3.4* 2.9* 3.1* 3.0* 3.0*    CBG: No results for input(s): GLUCAP in the last 168 hours.   No results found for this or any previous visit (from the past 240 hours).       Radiology Studies: No results found.      Scheduled Meds:  atorvastatin   80 mg Oral Daily   buprenorphine   2 patch Transdermal Weekly   busPIRone   15 mg Oral TID   DULoxetine   60 mg Oral Daily   ferrous sulfate   325 mg Oral QODAY   linaclotide   145 mcg Oral QAC breakfast   LORazepam   0.25 mg Oral BID   [START ON 08/14/2024] ondansetron  (ZOFRAN ) IV  4 mg Intravenous Once   pantoprazole  (PROTONIX ) IV  40 mg  Intravenous Q12H   pregabalin   50 mg Oral BID   tamsulosin   0.4 mg Oral QPC supper   traZODone   50 mg Oral QHS   Continuous Infusions:  sodium chloride        LOS: 4 days    Time spent: over 30 min     Jorge Monte, MD [facility]   To contact the attending provider between 7A-7P or the covering provider during after hours 7P-7A, please log into the web site [email or web address] and access using universal [place] password for that  web site. If you do not have the password, please call the hospital operator.  08/13/2024, 3:27 PM

## 2024-08-13 NOTE — Plan of Care (Signed)

## 2024-08-13 NOTE — Care Management Important Message (Signed)
 Important Message  Patient Details  Name: Jorge Sherman MRN: [id number] Date of Birth: Mar 31, 1941   Important Message Given:  Yes - Medicare IM     Claretta Deed 08/13/2024, 2:39 PM

## 2024-08-14 ENCOUNTER — Inpatient Hospital Stay (HOSPITAL_COMMUNITY)

## 2024-08-14 DIAGNOSIS — D649 Anemia, unspecified: Secondary | ICD-10-CM | POA: Diagnosis not present

## 2024-08-14 HISTORY — PX: IR BONE MARROW BIOPSY & ASPIRATION: IMG5727

## 2024-08-14 LAB — CBC WITH DIFFERENTIAL/PLATELET
Abs Immature Granulocytes: 0.01 K/uL (ref 0.00–0.07)
Basophils Absolute: 0 K/uL (ref 0.0–0.1)
Basophils Relative: 1 %
Eosinophils Absolute: 0 K/uL (ref 0.0–0.5)
Eosinophils Relative: 2 %
HCT: 24.7 % — ABNORMAL LOW (ref 39.0–52.0)
Hemoglobin: 8.2 g/dL — ABNORMAL LOW (ref 13.0–17.0)
Immature Granulocytes: 1 %
Lymphocytes Relative: 51 %
Lymphs Abs: 1.1 K/uL (ref 0.7–4.0)
MCH: 29.1 pg (ref 26.0–34.0)
MCHC: 33.2 g/dL (ref 30.0–36.0)
MCV: 87.6 fL (ref 80.0–100.0)
Monocytes Absolute: 0.1 K/uL (ref 0.1–1.0)
Monocytes Relative: 7 %
Neutro Abs: 0.8 K/uL — ABNORMAL LOW (ref 1.7–7.7)
Neutrophils Relative %: 38 %
Platelets: 96 K/uL — ABNORMAL LOW (ref 150–400)
RBC: 2.82 MIL/uL — ABNORMAL LOW (ref 4.22–5.81)
RDW: 15.7 % — ABNORMAL HIGH (ref 11.5–15.5)
Smear Review: NORMAL
WBC: 2 K/uL — ABNORMAL LOW (ref 4.0–10.5)
nRBC: 0 % (ref 0.0–0.2)

## 2024-08-14 LAB — BPAM RBC
Blood Product Expiration Date: 202510172359
ISSUE DATE / TIME: 202509232101
Unit Type and Rh: 6200

## 2024-08-14 LAB — TYPE AND SCREEN
ABO/RH(D): A POS
Antibody Screen: NEGATIVE
Unit division: 0

## 2024-08-14 LAB — COMPREHENSIVE METABOLIC PANEL WITH GFR
ALT: 10 U/L (ref 0–44)
AST: 16 U/L (ref 15–41)
Albumin: 2.9 g/dL — ABNORMAL LOW (ref 3.5–5.0)
Alkaline Phosphatase: 63 U/L (ref 38–126)
Anion gap: 5 (ref 5–15)
BUN: 19 mg/dL (ref 8–23)
CO2: 27 mmol/L (ref 22–32)
Calcium: 8.5 mg/dL — ABNORMAL LOW (ref 8.9–10.3)
Chloride: 101 mmol/L (ref 98–111)
Creatinine, Ser: 1.07 mg/dL (ref 0.61–1.24)
GFR, Estimated: >60 mL/min (ref >60)
Glucose, Bld: 103 mg/dL — ABNORMAL HIGH (ref 70–99)
Potassium: 4 mmol/L (ref 3.5–5.1)
Sodium: 133 mmol/L — ABNORMAL LOW (ref 135–145)
Total Bilirubin: 0.8 mg/dL (ref 0.0–1.2)
Total Protein: 6 g/dL — ABNORMAL LOW (ref 6.5–8.1)

## 2024-08-14 LAB — TROPONIN I (HIGH SENSITIVITY)
Troponin I (High Sensitivity): 4 ng/L (ref <18)
Troponin I (High Sensitivity): 5 ng/L (ref <18)

## 2024-08-14 LAB — MRSA NEXT GEN BY PCR, NASAL: MRSA by PCR Next Gen: DETECTED — AB

## 2024-08-14 LAB — PHOSPHORUS: Phosphorus: 3.7 mg/dL (ref 2.5–4.6)

## 2024-08-14 LAB — MAGNESIUM: Magnesium: 1.8 mg/dL (ref 1.7–2.4)

## 2024-08-14 MED ORDER — LIDOCAINE-EPINEPHRINE 1 %-1:100000 IJ SOLN
INTRAMUSCULAR | Status: AC
  Filled 2024-08-14: qty 1

## 2024-08-14 MED ORDER — BISACODYL 5 MG PO TBEC
5.0000 mg | DELAYED_RELEASE_TABLET | Freq: Once | ORAL | Status: AC
  Administered 2024-08-14: 5 mg via ORAL
  Filled 2024-08-14: qty 1

## 2024-08-14 MED ORDER — MIDAZOLAM HCL 2 MG/2ML IJ SOLN
INTRAMUSCULAR | Status: AC
  Filled 2024-08-14: qty 2

## 2024-08-14 MED ORDER — DOCUSATE SODIUM 100 MG PO CAPS
100.0000 mg | ORAL_CAPSULE | Freq: Two times a day (BID) | ORAL | Status: DC
  Administered 2024-08-14 – 2024-08-19 (×9): 100 mg via ORAL
  Filled 2024-08-14 (×11): qty 1

## 2024-08-14 MED ORDER — CHLORHEXIDINE GLUCONATE CLOTH 2 % EX PADS
6.0000 | MEDICATED_PAD | Freq: Every day | CUTANEOUS | Status: DC
  Administered 2024-08-16 – 2024-08-19 (×2): 6 via TOPICAL

## 2024-08-14 MED ORDER — ONDANSETRON HCL 4 MG/2ML IJ SOLN
INTRAMUSCULAR | Status: AC | PRN
  Administered 2024-08-14: 4 mg via INTRAVENOUS

## 2024-08-14 MED ORDER — ONDANSETRON HCL 4 MG/2ML IJ SOLN
INTRAMUSCULAR | Status: AC
  Filled 2024-08-14: qty 2

## 2024-08-14 MED ORDER — MIDAZOLAM HCL 2 MG/2ML IJ SOLN
INTRAMUSCULAR | Status: AC | PRN
  Administered 2024-08-14 (×2): 1 mg via INTRAVENOUS

## 2024-08-14 MED ORDER — MUPIROCIN 2 % EX OINT
1.0000 | TOPICAL_OINTMENT | Freq: Two times a day (BID) | CUTANEOUS | Status: AC
  Administered 2024-08-15 – 2024-08-19 (×10): 1 via NASAL
  Filled 2024-08-14 (×6): qty 22

## 2024-08-14 MED ORDER — POLYVINYL ALCOHOL 1.4 % OP SOLN
1.0000 [drp] | OPHTHALMIC | Status: DC | PRN
  Administered 2024-08-14 – 2024-08-17 (×3): 1 [drp] via OPHTHALMIC
  Filled 2024-08-14 (×2): qty 15

## 2024-08-14 MED ORDER — CHLORHEXIDINE GLUCONATE CLOTH 2 % EX PADS: 6.0000 | MEDICATED_PAD | Freq: Every day | CUTANEOUS | Status: DC

## 2024-08-14 MED ORDER — MELATONIN 3 MG PO TABS
3.0000 mg | ORAL_TABLET | Freq: Every evening | ORAL | Status: DC | PRN
  Administered 2024-08-14 – 2024-08-18 (×6): 3 mg via ORAL
  Filled 2024-08-14 (×6): qty 1

## 2024-08-14 MED ORDER — LIDOCAINE-EPINEPHRINE 1 %-1:100000 IJ SOLN
20.0000 mL | Freq: Once | INTRAMUSCULAR | Status: AC
  Administered 2024-08-14: 20 mL via INTRADERMAL
  Filled 2024-08-14: qty 20

## 2024-08-14 MED ORDER — NITROGLYCERIN 0.4 MG SL SUBL: 0.4000 mg | SUBLINGUAL_TABLET | SUBLINGUAL | Status: DC | PRN

## 2024-08-14 MED ORDER — FENTANYL CITRATE (PF) 100 MCG/2ML IJ SOLN
INTRAMUSCULAR | Status: AC
  Filled 2024-08-14: qty 2

## 2024-08-14 MED ORDER — POLYETHYLENE GLYCOL 3350 17 G PO PACK
17.0000 g | PACK | Freq: Every day | ORAL | Status: DC
  Administered 2024-08-14 – 2024-08-15 (×2): 17 g via ORAL
  Filled 2024-08-14 (×6): qty 1

## 2024-08-14 MED ORDER — FENTANYL CITRATE (PF) 100 MCG/2ML IJ SOLN
INTRAMUSCULAR | Status: AC | PRN
  Administered 2024-08-14: 25 ug via INTRAVENOUS
  Administered 2024-08-14: 50 ug via INTRAVENOUS

## 2024-08-14 NOTE — Procedures (Signed)
 Interventional Radiology Procedure Note  Procedure: Fluoroscopic guided aspirate and core biopsy of left iliac bone  Complications: None  Recommendations: - Bedrest supine x 1 hrs - Follow biopsy results   Ester Sides, MD

## 2024-08-14 NOTE — Progress Notes (Signed)
 PROGRESS NOTE  Jorge Sherman FMW:[id number] DOB: 1941-06-22 DOA: 08/08/2024 PCP: [name] LABOR, MD   LOS: 5 days   Brief narrative:  83 y.o. male with past medical history significant for GI bleed, anemia, chronic pain, depression and anxiety, type 2 diabetes presented to hospital with dizziness and labs notable for hemoglobin of 6.8.  Patient was then out of hospital for symptomatic anemia.  At this time patient has undergone capsule endoscopy which was unrevealing.  Oncology is planning for bone marrow biopsy on 08/14/2024.       Assessment/Plan: Principal Problem:   Symptomatic anemia  Symptomatic Anemia Recent GI bleed with melena.  Previous workup with EGD on 06/25/2024 showed  GAVE s/p APC.  EDG 9/5 showed normal esophagus and stomach as well as the duodenal bulb and 2nd part of duodenum.  Small bowel AVM's were suspected. Hemoglobin yesterday was 7.1 so received 1 unit of packed RBC with improvement of hemoglobin to 8.2 today.  Continue twice daily PPI. GI was consulted and capsule showed mild inflammation in the stomach and proximal duodenum, duodenal diverticulum, scattered areas of fair visualization but no evidence of active bleeding.  Elevated b12, elevated folate.  Ferritin elevated.  Status post bone marrow biopsy today.  Monitor CBC closely.   Pancytopenia Seen by hematology at outside hospital who recommended holding hydroxychloroquine .  Chronic liver disease was thought to be one of the possibilities.  WBC at 2.0 today.  Comprehensive levels are within normal limits.  Followed by oncology 08/11/2024 and plan for bone marrow biopsy on 9/24.  Will need outpatient follow-up.   Possible chronic liver disease. US  with nodular contour, coarse echotexture, and increased echogenicity - possible chronic liver disease.  INR of 1.1.  LFTs within normal limits.  Albumin low at 2.9. Will need outpatient follow-up   Chronic Pain  Continue cymbalta , lyrica , morphine .  Muscle relaxant on  hold, was on Butrans  patch.   Patient was discharged on 60 milligram cymbalta , 50 mg lyrica , morphine  15 mg q8 prn, butran 20 mcg/hr patch weekly on last admission.   Anxiety  Depression Continue BuSpar  Cymbalta  and Ativan    History of rheumatoid arthritis Hydroxychloroquine  on hold    BPH  Continue Flomax    Diabetes mellitus type 2. Review of recent hemoglobin A1c 01/2021 was 5.4   Moderate to severe aortic stenosis. Recommend follow-up with cardiology as outpatient.   Muscle Tension Dysphonia Seen by ENT on 08/08/2023.  Follow-up with ENT speech-language therapy as outpatient at [facility] for voice therapy   Debility deconditioning.  Patient has been seen by physical therapy and recommended skilled nursing facility placement  DVT prophylaxis: SCDs Start: 08/08/24 2034   Disposition: Skilled nursing facility  Status is: Inpatient Remains inpatient appropriate because: Status post bone marrow biopsy, pending clinical improvement, oncology, skilled nursing facility placement follow-up    Code Status:     Code Status: Full Code  Family Communication: None at bedside  Consultants: Oncology GI  Procedures: Bone marrow biopsy 08/14/2024  Anti-infectives:  None  Anti-infectives (From admission, onward)    None        Subjective: Today, patient was seen and examined at bedside.  Patient complains of mild chest discomfort.  Denies any nausea vomiting fever chills shortness of breath or chest pain.  Objective: Vitals:   08/14/24 0832 08/14/24 0845  BP: 132/79 (!) 90/54  Pulse: 84 90  Resp: 15 18  Temp:    SpO2: 97% 95%    Intake/Output Summary (Last 24  hours) at 08/14/2024 1128 Last data filed at 08/14/2024 0500 Gross per 24 hour  Intake 676 ml  Output 1075 ml  Net -399 ml   Filed Weights   08/08/24 1743 08/08/24 1847 08/09/24 1603  Weight: 107 kg 97.3 kg 97.3 kg   Body mass index is 30.78 kg/m.   Physical Exam:  GENERAL: Patient is  alert awake and oriented. Not in obvious distress.  Elderly male. HENT: Pallor noted.  Pupils equally reactive to light. Oral mucosa is moist NECK: is supple, no gross swelling noted. CHEST: Clear to auscultation. No crackles or wheezes.   CVS: S1 and S2 heard, no murmur. Regular rate and rhythm.  ABDOMEN: Soft, non-tender, bowel sounds are present. EXTREMITIES: No edema. CNS: Cranial nerves are intact. No focal motor deficits. SKIN: warm and dry without rashes.  Data Review: I have personally reviewed the following laboratory data and studies,  CBC: Recent Labs  Lab 08/10/24 0548 08/11/24 0529 08/12/24 0437 08/13/24 0510 08/14/24 0532  WBC 2.1* 2.1* 2.2* 2.1* 2.0*  NEUTROABS 1.0* 0.6* 0.8* 0.8* 0.8*  HGB 8.2* 7.8* 7.7* 7.6* 8.2*  HCT 25.0* 24.2* 23.9* 23.5* 24.7*  MCV 89.3 88.6 89.5 89.7 87.6  PLT 123* 127* 114* 112* 96*   Basic Metabolic Panel: Recent Labs  Lab 08/10/24 0548 08/11/24 0529 08/12/24 0437 08/13/24 0510 08/14/24 0532  NA 132* 135 133* 132* 133*  K 4.3 4.0 4.0 3.9 4.0  CL 100 99 101 101 101  CO2 23 28 24 26 27   GLUCOSE 91 100* 107* 117* 103*  BUN 13 14 19 21 19   CREATININE 0.96 1.03 1.17 1.14 1.07  CALCIUM  8.7* 8.9 8.5* 8.5* 8.5*  MG  --  1.9 1.8 1.8 1.8  PHOS  --  4.2 4.3 4.0 3.7   Liver Function Tests: Recent Labs  Lab 08/09/24 0407 08/11/24 0529 08/12/24 0437 08/13/24 0510 08/14/24 0532  AST 16 15 15 15 16   ALT 10 9 8 8 10   ALKPHOS 53 63 58 63 63  BILITOT 1.1 0.8 0.8 0.7 0.8  PROT 5.8* 6.1* 5.8* 6.1* 6.0*  ALBUMIN 2.9* 3.1* 3.0* 3.0* 2.9*   Recent Labs  Lab 08/08/24 1928  LIPASE 18   No results for input(s): AMMONIA in the last 168 hours. Cardiac Enzymes: No results for input(s): CKTOTAL, CKMB, CKMBINDEX, TROPONINI in the last 168 hours. BNP (last 3 results) No results for input(s): BNP in the last 8760 hours.  ProBNP (last 3 results) No results for input(s): PROBNP in the last 8760 hours.  CBG: No results for  input(s): GLUCAP in the last 168 hours. No results found for this or any previous visit (from the past 240 hours).   Studies: No results found.    Princeston Blizzard, MD  [facility] 08/14/2024  If 7PM-7AM, please contact night-coverage

## 2024-08-14 NOTE — Plan of Care (Signed)

## 2024-08-14 NOTE — Plan of Care (Signed)

## 2024-08-14 NOTE — Hospital Course (Addendum)
 83 y.o. male with past medical history significant for GI bleed, anemia, chronic pain, depression and anxiety, type 2 diabetes presented to hospital with dizziness and labs notable for hemoglobin of 6.8.  Patient was then out of hospital for symptomatic anemia.  At this time patient has undergone capsule endoscopy which was unrevealing. Underwent bone marrow biopsy on 08/14/2024.   Myelodysplastic Syndrome with Excess Blasts - Type 1 Pancytopenia Patient was evaluated by Dr. Gatha with oncology on 08/11/2024 who recommended a BM Bx, which was completed on 9/24. BM Bx results showed findings concerning for MDS-EB-1. These findings were explained to the patient in great detail - attempted to include the patient's daughter, [name], and son over the phone during that conversation but was unable to reach either of them.  - The patient will require close outpatient follow up with Heme/Onc on discharge  Symptomatic Anemia Recent GI bleed with melena.  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 Repeat  EDG 9/5 showed normal esophagus and stomach as well as the duodenal bulb and 2nd part of duodenum.  Small bowel AVM's were suspected. S/p 2 units of pRBC.   GI was consulted and capsule endoscopy on 9/19 showed mild inflammation in the stomach and proximal duodenum, duodenal diverticulum, scattered areas of fair visualization but no evidence of active bleeding. GI signed off with no plans for further evaluation on 9/20. - Continue twice daily PPI - Slight drop in Hgb to 7.8 from 8.0 although patient remains hemodynamically stable and asymptomatic with no hematochezia, melena, hematemesis.  - Given the patient's persistent slow down-trend in Hgb, now in the setting of known MDS, and through shared-decision making with the patient, transfusion of 1 unit of pRBCs was ordered in order to augment the patient's Hgb reserve and potentially offer him more time until he can be seen by heme/onc and hopefully  avoid readmission due to low Hgb levels.  - Ordered 1 unit pRBCs for transfusion.   Possible chronic liver disease. US  with nodular contour, coarse echotexture, and increased echogenicity - possible chronic liver disease.  INR of 1.1.  LFTs within normal limits.  Albumin low at 2.9.  - Will need outpatient follow-up with GI   Chronic Pain  Continue cymbalta , lyrica , morphine .  Muscle relaxant on hold, was on Butrans  patch.   Patient was discharged on 60 milligram cymbalta , 50 mg lyrica , morphine  15 mg q8 prn, butran 20 mcg/hr patch weekly on last admission.   Anxiety  Depression Continue BuSpar  Cymbalta  and Ativan    History of rheumatoid arthritis Hydroxychloroquine  on hold    BPH  Continue Flomax    Diabetes mellitus type 2. Review of recent hemoglobin A1c 01/2021 was 5.4   Moderate to severe aortic stenosis. Recommend follow-up with cardiology as outpatient.   Muscle Tension Dysphonia Seen by ENT on 08/08/2023.  Follow-up with ENT speech-language therapy as outpatient at [facility] for voice therapy  DVT prophylaxis: SCDs

## 2024-08-15 DIAGNOSIS — D649 Anemia, unspecified: Secondary | ICD-10-CM | POA: Diagnosis not present

## 2024-08-15 LAB — CBC WITH DIFFERENTIAL/PLATELET
Abs Immature Granulocytes: 0.02 K/uL (ref 0.00–0.07)
Basophils Absolute: 0 K/uL (ref 0.0–0.1)
Basophils Relative: 1 %
Eosinophils Absolute: 0 K/uL (ref 0.0–0.5)
Eosinophils Relative: 2 %
HCT: 26.5 % — ABNORMAL LOW (ref 39.0–52.0)
Hemoglobin: 8.6 g/dL — ABNORMAL LOW (ref 13.0–17.0)
Immature Granulocytes: 1 %
Lymphocytes Relative: 46 %
Lymphs Abs: 1 K/uL (ref 0.7–4.0)
MCH: 29.4 pg (ref 26.0–34.0)
MCHC: 32.5 g/dL (ref 30.0–36.0)
MCV: 90.4 fL (ref 80.0–100.0)
Monocytes Absolute: 0.1 K/uL (ref 0.1–1.0)
Monocytes Relative: 6 %
Neutro Abs: 1 K/uL — ABNORMAL LOW (ref 1.7–7.7)
Neutrophils Relative %: 44 %
Platelets: 88 K/uL — ABNORMAL LOW (ref 150–400)
RBC: 2.93 MIL/uL — ABNORMAL LOW (ref 4.22–5.81)
RDW: 15.6 % — ABNORMAL HIGH (ref 11.5–15.5)
WBC: 2.2 K/uL — ABNORMAL LOW (ref 4.0–10.5)
nRBC: 0 % (ref 0.0–0.2)

## 2024-08-15 NOTE — Plan of Care (Signed)

## 2024-08-15 NOTE — Evaluation (Signed)
 Occupational Therapy Evaluation Patient Details Name: Jorge Sherman MRN: [id number] DOB: [birth year] Today's Date: 08/15/2024   History of Present Illness   83 y.o. male presents to [facility] 08/08/24 with dizziness and admitted with symptomatic anemia. 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 Recent d/c w/ acute blood less anemia w/ upper GI bleed w/ melena. PMHx: chronic pain, RA, DM, muscle tension dysphonia, pancytopenia     Clinical Impressions Pt is making limited progress towards their acute OT goals. On arrival pt had complaints of significant pain at biopsy site, per RN pt is not due for any pain medication. Pt was adjusted in the bed with min A and ice was applied to site for pain management. Pt reported increased comfort. He was able to groom at bed level but politely declined OOB due to pain. OT to continue to follow acutely to facilitate progress towards established goals. Pt will continue to benefit from skilled inpatient follow up therapy, <3 hours/day.      If plan is discharge home, recommend the following:   A little help with walking and/or transfers;A little help with bathing/dressing/bathroom;Assistance with cooking/housework;Assist for transportation;Help with stairs or ramp for entrance      Equipment Recommendations   None recommended by OT      Precautions/Restrictions   Precautions Precautions: Fall Restrictions Weight Bearing Restrictions Per Provider Order: No     Mobility Bed Mobility Overal bed mobility: Needs Assistance Bed Mobility: Rolling Rolling: Min assist         General bed mobility comments: minA to roll due to pain    Transfers Overall transfer level: Needs assistance                 General transfer comment: pt polietly declined OOB due to increased pain from biopsy site      Balance Overall balance assessment: Needs assistance               ADL either performed or assessed with clinical  judgement   ADL Overall ADL's : Needs assistance/impaired Eating/Feeding: Independent   Grooming: Set up;Sitting     Functional mobility during ADLs: Contact guard assist (bed mobility) General ADL Comments: pt reports increased pain this date and declined OOB. Bed level grooming and rolling completed     Vision   Vision Assessment?: No apparent visual deficits     Perception Perception: Within Functional Limits       Praxis Praxis: WFL       Pertinent Vitals/Pain Pain Assessment Pain Assessment: Faces Faces Pain Scale: Hurts even more Pain Location: bone biopsy site Pain Intervention(s): Monitored during session, Patient requesting pain meds-RN notified, Ice applied     Extremity/Trunk Assessment Upper Extremity Assessment Upper Extremity Assessment: Generalized weakness;LUE deficits/detail;RUE deficits/detail RUE Deficits / Details: limited shoulder ROM, generally weak but able to use functionally for mobility and ADls LUE Deficits / Details: limited overhead ROM   Lower Extremity Assessment Lower Extremity Assessment: Defer to PT evaluation       Communication Communication Communication: No apparent difficulties   Cognition Arousal: Alert Behavior During Therapy: WFL for tasks assessed/performed Cognition: No apparent impairments       Following commands: Intact       Cueing  General Comments   Cueing Techniques: Verbal cues  VSS, RN and NT made aware that pt is left with ice pack placed on low back       OT Goals(Current goals can be found in the care plan section)  Acute Rehab OT Goals Patient Stated Goal: less pain OT Goal Formulation: With patient Time For Goal Achievement: 08/26/24 Potential to Achieve Goals: Good ADL Goals Pt Will Perform Lower Body Dressing: with min assist;sit to/from stand Pt Will Transfer to Toilet: with modified independence Additional ADL Goal #1: pt will tolerate standing for >3 minutes with LRAD as a  precursor to OOB ADLs   OT Frequency:  Min 1X/week    AM-PAC OT 6 Clicks Daily Activity     Outcome Measure Help from another person eating meals?: None Help from another person taking care of personal grooming?: A Little Help from another person toileting, which includes using toliet, bedpan, or urinal?: A Little Help from another person bathing (including washing, rinsing, drying)?: A Lot Help from another person to put on and taking off regular upper body clothing?: A Little Help from another person to put on and taking off regular lower body clothing?: A Lot 6 Click Score: 17   End of Session Nurse Communication: Mobility status (Ice applied to low back, needs to come out in 15-20 mins)  Activity Tolerance: Patient tolerated treatment well Patient left: with call bell/phone within reach;in chair;with chair alarm set  OT Visit Diagnosis: Unsteadiness on feet (R26.81);Other abnormalities of gait and mobility (R26.89);Muscle weakness (generalized) (M62.81)                Time: 9055-8997 OT Time Calculation (min): 18 min Charges:  OT General Charges $OT Visit: 1 Visit OT Treatments $Therapeutic Activity: 8-22 mins  Lucie Kendall, OTR/L Acute Rehabilitation Services Office [phone] Secure Chat Communication Preferred   Lucie JONETTA Kendall 08/15/2024, 10:12 AM

## 2024-08-15 NOTE — Progress Notes (Addendum)
 Progress Note   Patient: Jorge Sherman FMW:[id number] DOB: [birth year] DOA: 08/08/2024     6 DOS: the patient was seen and examined on 08/15/2024   Brief hospital course: 83 y.o. male with past medical history significant for GI bleed, anemia, chronic pain, depression and anxiety, type 2 diabetes presented to hospital with dizziness and labs notable for hemoglobin of 6.8.  Patient was then out of hospital for symptomatic anemia.  At this time patient has undergone capsule endoscopy which was unrevealing. Underwent bone marrow biopsy on 08/14/2024.     Symptomatic Anemia Recent GI bleed with melena.  Previous workup with EGD on 06/25/2024 showed  GAVE s/p APC.  EDG 9/5 showed normal esophagus and stomach as well as the duodenal bulb and 2nd part of duodenum.  Small bowel AVM's were suspected. S/p 2 units of pRBC.   GI was consulted and capsule showed mild inflammation in the stomach and proximal duodenum, duodenal diverticulum, scattered areas of fair visualization but no evidence of active bleeding.  Elevated b12, elevated folate.  Ferritin elevated. - Continue twice daily PPI - Hgb stable at 8.6 today   Pancytopenia Seen by hematology at outside hospital who recommended holding hydroxychloroquine .  Chronic liver disease was thought to be one of the possibilities. Comprehensive levels are within normal limits.  Seen by oncology 08/11/2024, s/p bone marrow biopsy on 9/24.   - Will need outpatient follow-up with heme/onc - Slight improvement in WBC and Hgb, slight drop in PLTs   Possible chronic liver disease. US  with nodular contour, coarse echotexture, and increased echogenicity - possible chronic liver disease.  INR of 1.1.  LFTs within normal limits.  Albumin low at 2.9.  - Will need outpatient follow-up with GI   Chronic Pain  Continue cymbalta , lyrica , morphine .  Muscle relaxant on hold, was on Butrans  patch.   Patient was discharged on 60 milligram cymbalta , 50 mg lyrica , morphine  15 mg q8  prn, butran 20 mcg/hr patch weekly on last admission.   Anxiety  Depression Continue BuSpar  Cymbalta  and Ativan    History of rheumatoid arthritis Hydroxychloroquine  on hold    BPH  Continue Flomax    Diabetes mellitus type 2. Review of recent hemoglobin A1c 01/2021 was 5.4   Moderate to severe aortic stenosis. Recommend follow-up with cardiology as outpatient.   Muscle Tension Dysphonia Seen by ENT on 08/08/2023.  Follow-up with ENT speech-language therapy as outpatient at [facility] for voice therapy   DVT prophylaxis: SCDs             Subjective: Patient seen laying in bed.  Reports no acute concerns.  States has some back pain which is chronic in nature.  Denies any chest pain, shortness of breath, abdominal pain, nausea, vomiting.   Physical Exam:       Vitals:    08/15/24 0533 08/15/24 0743 08/15/24 1134 08/15/24 1606  BP: 122/75 113/62 (!) 140/69 123/83  Pulse:   72 78 76  Resp: 20 17 17 18   Temp: 97.8 F (36.6 C) 97.8 F (36.6 C) 97.9 F (36.6 C) (!) 96.7 F (35.9 C)  TempSrc: Oral Oral Oral    SpO2: 95% 96% 99% 98%  Weight:          Height:            General: AAO x 3. HEENT: Pallor noted, oral mucosa moist Chest: Clear to auscultation bilaterally no crackles or wheezing. Cardiovascular: RRR, no murmurs Abdomen: Soft, nontender, normal bowel sounds Extremities: No edema CNS:  No focal deficits     Family Communication: None   Disposition: Status is: Inpatient Remains inpatient appropriate because: Status post bone marrow biopsy, pending clinical improvement  Planned Discharge Destination: Skilled nursing facility       Time spent: 35 minutes   Author: Duffy Larch, MD 08/15/2024 7:51 PM   For on call review [email or web address].

## 2024-08-15 NOTE — Plan of Care (Signed)

## 2024-08-16 DIAGNOSIS — D649 Anemia, unspecified: Secondary | ICD-10-CM | POA: Diagnosis not present

## 2024-08-16 LAB — CBC WITH DIFFERENTIAL/PLATELET
Abs Immature Granulocytes: 0.01 K/uL (ref 0.00–0.07)
Basophils Absolute: 0 K/uL (ref 0.0–0.1)
Basophils Relative: 0 %
Eosinophils Absolute: 0.1 K/uL (ref 0.0–0.5)
Eosinophils Relative: 3 %
HCT: 24.5 % — ABNORMAL LOW (ref 39.0–52.0)
Hemoglobin: 8 g/dL — ABNORMAL LOW (ref 13.0–17.0)
Immature Granulocytes: 1 %
Lymphocytes Relative: 53 %
Lymphs Abs: 1.1 K/uL (ref 0.7–4.0)
MCH: 28.9 pg (ref 26.0–34.0)
MCHC: 32.7 g/dL (ref 30.0–36.0)
MCV: 88.4 fL (ref 80.0–100.0)
Monocytes Absolute: 0.1 K/uL (ref 0.1–1.0)
Monocytes Relative: 7 %
Neutro Abs: 0.7 K/uL — ABNORMAL LOW (ref 1.7–7.7)
Neutrophils Relative %: 36 %
Platelets: 91 K/uL — ABNORMAL LOW (ref 150–400)
RBC: 2.77 MIL/uL — ABNORMAL LOW (ref 4.22–5.81)
RDW: 15.1 % (ref 11.5–15.5)
Smear Review: NORMAL
WBC: 2 K/uL — ABNORMAL LOW (ref 4.0–10.5)
nRBC: 0 % (ref 0.0–0.2)

## 2024-08-16 LAB — SURGICAL PATHOLOGY

## 2024-08-16 MED ORDER — PANTOPRAZOLE SODIUM 40 MG PO TBEC
40.0000 mg | DELAYED_RELEASE_TABLET | Freq: Two times a day (BID) | ORAL | Status: DC
Start: 2024-08-16 — End: 2024-08-19
  Administered 2024-08-16 – 2024-08-19 (×7): 40 mg via ORAL
  Filled 2024-08-16 (×7): qty 1

## 2024-08-16 NOTE — Plan of Care (Signed)
  Problem: Education: Goal: Knowledge of General Education information will improve Description: Including pain rating scale, medication(s)/side effects and non-pharmacologic comfort measures Outcome: Progressing   Problem: Health Behavior/Discharge Planning: Goal: Ability to manage health-related needs will improve Outcome: Progressing   Problem: Clinical Measurements: Goal: Will remain free from infection Outcome: Progressing Goal: Respiratory complications will improve Outcome: Progressing   Problem: Activity: Goal: Risk for activity intolerance will decrease Outcome: Progressing   Problem: Nutrition: Goal: Adequate nutrition will be maintained Outcome: Progressing   Problem: Elimination: Goal: Will not experience complications related to bowel motility Outcome: Progressing   Problem: Safety: Goal: Ability to remain free from injury will improve Outcome: Progressing

## 2024-08-17 DIAGNOSIS — D649 Anemia, unspecified: Secondary | ICD-10-CM | POA: Diagnosis not present

## 2024-08-17 LAB — CBC
HCT: 24.3 % — ABNORMAL LOW (ref 39.0–52.0)
Hemoglobin: 7.8 g/dL — ABNORMAL LOW (ref 13.0–17.0)
MCH: 28.5 pg (ref 26.0–34.0)
MCHC: 32.1 g/dL (ref 30.0–36.0)
MCV: 88.7 fL (ref 80.0–100.0)
Platelets: 98 K/uL — ABNORMAL LOW (ref 150–400)
RBC: 2.74 MIL/uL — ABNORMAL LOW (ref 4.22–5.81)
RDW: 14.9 % (ref 11.5–15.5)
WBC: 2 K/uL — ABNORMAL LOW (ref 4.0–10.5)
nRBC: 0 % (ref 0.0–0.2)

## 2024-08-17 LAB — PREPARE RBC (CROSSMATCH)

## 2024-08-17 LAB — HEMOGLOBIN AND HEMATOCRIT, BLOOD
HCT: 29.3 % — ABNORMAL LOW (ref 39.0–52.0)
Hemoglobin: 9.6 g/dL — ABNORMAL LOW (ref 13.0–17.0)

## 2024-08-17 MED ORDER — SODIUM CHLORIDE 0.9% IV SOLUTION: Freq: Once | INTRAVENOUS | Status: AC

## 2024-08-17 MED ORDER — INFLUENZA VAC SPLIT HIGH-DOSE 0.5 ML IM SUSY
0.5000 mL | PREFILLED_SYRINGE | INTRAMUSCULAR | Status: AC
  Administered 2024-08-18: 0.5 mL via INTRAMUSCULAR
  Filled 2024-08-17: qty 0.5

## 2024-08-17 NOTE — TOC Transition Note (Signed)
 Transition of Care [facility]) - Discharge Note   Patient Details  Name: Jorge Sherman MRN: [id number] Date of Birth: 03-26-1941  [facility]) CM/SW Contact:  Ermalinda Penton [place], [place] Phone Number: 08/17/2024, 8:53 AM   Clinical Narrative:     Patient to discharge to [name] today. Patient will be going to [facility]m 11[facility], RN can call report to [phone] ask to speak to [facility]. Patient to be transported by [facility] back to the facility and will be contacted once discharge summary is provided to the facility.  Corwyn Vora, LCSW Transition of Care    Final next level of care: Skilled Nursing Facility Barriers to Discharge: Continued Medical Work up   Patient Goals and CMS Choice            Discharge Placement                       Discharge Plan and Services Additional resources added to the After Visit Summary for       Post Acute Care Choice: Skilled Nursing Facility                               Social Drivers of Health (SDOH) Interventions SDOH Screenings   Food Insecurity: No Food Insecurity (08/09/2024)  Housing: Low Risk  (08/08/2024)  Transportation Needs: No Transportation Needs (08/09/2024)  Utilities: Not At Risk (08/09/2024)  Financial Resource Strain: Low Risk  (11/01/2022)   [facility]  Social Connections: Socially Isolated (08/09/2024)  Stress: No Stress Concern Present (11/01/2022)   Received from [facility]  Tobacco Use: Medium Risk (08/12/2024)     Readmission Risk Interventions     No data to display

## 2024-08-17 NOTE — Progress Notes (Signed)
 Progress Note   Patient: Jorge Sherman FMW:[id number] DOB: [birth year] DOA: 08/08/2024     8 DOS: the patient was seen and examined on 08/17/2024   Brief hospital course: 83 y.o. male with past medical history significant for GI bleed, anemia, chronic pain, depression and anxiety, type 2 diabetes presented to hospital with dizziness and labs notable for hemoglobin of 6.8.  Patient was then out of hospital for symptomatic anemia.  At this time patient has undergone capsule endoscopy which was unrevealing. Underwent bone marrow biopsy on 08/14/2024.   Myelodysplastic Syndrome with Excess Blasts - Type 1 Pancytopenia Patient was evaluated by Dr. Gatha with oncology on 08/11/2024 who recommended a BM Bx, which was completed on 9/24. BM Bx results showed findings concerning for MDS-EB-1. These findings were explained to the patient in great detail - attempted to include the patient's daughter, [name], and son over the phone during that conversation but was unable to reach either of them.  - The patient will require close outpatient follow up with Heme/Onc on discharge  Symptomatic Anemia Recent GI bleed with melena.  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 Repeat  EDG 9/5 showed normal esophagus and stomach as well as the duodenal bulb and 2nd part of duodenum.  Small bowel AVM's were suspected. S/p 2 units of pRBC.   GI was consulted and capsule endoscopy on 9/19 showed mild inflammation in the stomach and proximal duodenum, duodenal diverticulum, scattered areas of fair visualization but no evidence of active bleeding. GI signed off with no plans for further evaluation on 9/20. - Continue twice daily PPI - Slight drop in Hgb to 7.8 from 8.0 although patient remains hemodynamically stable and asymptomatic with no hematochezia, melena, hematemesis.  - Given the patient's persistent slow down-trend in Hgb, now in the setting of known MDS, and through shared-decision making with the  patient, transfusion of 1 unit of pRBCs was ordered in order to augment the patient's Hgb reserve and potentially offer him more time until he can be seen by heme/onc and hopefully avoid readmission due to low Hgb levels.  - Ordered 1 unit pRBCs for transfusion.   Possible chronic liver disease. US  with nodular contour, coarse echotexture, and increased echogenicity - possible chronic liver disease.  INR of 1.1.  LFTs within normal limits.  Albumin low at 2.9.  - Will need outpatient follow-up with GI   Chronic Pain  Continue cymbalta , lyrica , morphine .  Muscle relaxant on hold, was on Butrans  patch.   Patient was discharged on 60 milligram cymbalta , 50 mg lyrica , morphine  15 mg q8 prn, butran 20 mcg/hr patch weekly on last admission.   Anxiety  Depression Continue BuSpar  Cymbalta  and Ativan    History of rheumatoid arthritis Hydroxychloroquine  on hold    BPH  Continue Flomax    Diabetes mellitus type 2. Review of recent hemoglobin A1c 01/2021 was 5.4   Moderate to severe aortic stenosis. Recommend follow-up with cardiology as outpatient.   Muscle Tension Dysphonia Seen by ENT on 08/08/2023.  Follow-up with ENT speech-language therapy as outpatient at [facility] for voice therapy  DVT prophylaxis: SCDs   Subjective: Patient seen laying in bed. No acute events overnight. Denies any abdominal pain, nausea, vomiting, hematochezia, melena, hematemesis. Given the patient's persistent slow down-trend in Hgb, now in the setting of known MDS, and through shared-decision making with the patient, discussed transfusion of 1 unit of pRBCs todayin order to augment the patient's Hgb reserve and potentially offer him more time  until he can be seen by heme/onc and hopefully avoid readmission due to low Hgb levels. Patient was in agreement with this plan. Attempted to include the patient's daughter and son during this discussion but was unable to reach them by phone.   Physical Exam: Vitals:    08/17/24 1236 08/17/24 1258 08/17/24 1313 08/17/24 1551  BP: 107/78 107/78 102/68 115/66  Pulse: 91 89 86 78  Resp: 16 16    Temp: 99.3 F (37.4 C) 99.3 F (37.4 C) 98.2 F (36.8 C) 98.2 F (36.8 C)  TempSrc:   Oral Oral  SpO2: 100% 100% 96% 93%  Weight:      Height:       General: AAO x 3. HEENT: Pallor noted, oral mucosa moist Chest: Clear to auscultation bilaterally no crackles or wheezing. Cardiovascular: RRR, no murmurs Abdomen: Soft, nontender, normal bowel sounds Extremities: No edema CNS: No focal deficits   Family Communication: Discussed with patient who informed his daughter  Disposition: Status is: Inpatient Remains inpatient appropriate because: Will be receiving 1 unit of pRBCs today with plans for discharge back to SNF tomorrow pending hemodynamic stability  Planned Discharge Destination: Skilled nursing facility    Time spent: 58 minutes  Author: Duffy Larch, MD 08/17/2024 5:13 PM  For on call review [email or web address].

## 2024-08-17 NOTE — Progress Notes (Signed)
 Progress Note   Patient: Jorge Sherman FMW:[id number] DOB: 20-Oct-1941 DOA: 08/08/2024     8 DOS: the patient was seen and examined on 08/17/2024   Brief hospital course: 83 y.o. male with past medical history significant for GI bleed, anemia, chronic pain, depression and anxiety, type 2 diabetes presented to hospital with dizziness and labs notable for hemoglobin of 6.8.  Patient was then out of hospital for symptomatic anemia.  At this time patient has undergone capsule endoscopy which was unrevealing. Underwent bone marrow biopsy on 08/14/2024.   Myelodysplastic Syndrome with Excess Blasts - Type 1 Pancytopenia Patient was evaluated by Dr. Gatha with oncology on 08/11/2024 who recommended a BM Bx, which was completed on 9/24. BM Bx results showed findings concerning for MDS-EB-1. These findings were explained to the patient in great detail - attempted to include the patient's daughter, [name], and son over the phone during that conversation but was unable to reach either of them.  - The patient will require close outpatient follow up with Heme/Onc on discharge  Symptomatic Anemia Recent GI bleed with melena.  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 Repeat  EDG 9/5 showed normal esophagus and stomach as well as the duodenal bulb and 2nd part of duodenum.  Small bowel AVM's were suspected. S/p 2 units of pRBC.   GI was consulted and capsule endoscopy on 9/19 showed mild inflammation in the stomach and proximal duodenum, duodenal diverticulum, scattered areas of fair visualization but no evidence of active bleeding. GI signed off with no plans for further evaluation on 9/20. - Continue twice daily PPI - Slight drop in Hgb to 7.8 from 8.0 although patient remains hemodynamically stable and asymptomatic with no hematochezia, melena, hematemesis.  - Given the patient's persistent slow down-trend in Hgb, now in the setting of known MDS, and through shared-decision making with the  patient, transfusion of 1 unit of pRBCs was ordered in order to augment the patient's Hgb reserve and potentially offer him more time until he can be seen by heme/onc and hopefully avoid readmission due to low Hgb levels.  - Ordered 1 unit pRBCs for transfusion.   Possible chronic liver disease. US  with nodular contour, coarse echotexture, and increased echogenicity - possible chronic liver disease.  INR of 1.1.  LFTs within normal limits.  Albumin low at 2.9.  - Will need outpatient follow-up with GI   Chronic Pain  Continue cymbalta , lyrica , morphine .  Muscle relaxant on hold, was on Butrans  patch.   Patient was discharged on 60 milligram cymbalta , 50 mg lyrica , morphine  15 mg q8 prn, butran 20 mcg/hr patch weekly on last admission.   Anxiety  Depression Continue BuSpar  Cymbalta  and Ativan    History of rheumatoid arthritis Hydroxychloroquine  on hold    BPH  Continue Flomax    Diabetes mellitus type 2. Review of recent hemoglobin A1c 01/2021 was 5.4   Moderate to severe aortic stenosis. Recommend follow-up with cardiology as outpatient.   Muscle Tension Dysphonia Seen by ENT on 08/08/2023.  Follow-up with ENT speech-language therapy as outpatient at [facility] for voice therapy  DVT prophylaxis: SCDs   Subjective: Patient seen laying in bed. No acute events overnight. Denies any abdominal pain, nausea, vomiting, hematochezia, melena, hematemesis.  Had extensive discussion with patient's daughter, [name], over the phone today who expressed significant understandable concern regarding her father's health and frustration with his recurrent hospitalizations over the last few months due to the same issue of anemia, and the high chance  of him returning here from his SNF and a few days if his hemoglobin continues to decline. Explained that the patient is currently hemodynamically stable and asymptomatic without signs of active GI bleed with pending workup from heme-onc. Additionally,  had extensive discussion with the patient about the current plan for his care including the fact that the remainder of his workup is to be completed in the outpatient setting with hematology/oncology and that there is no indication to remain hospitalized to await the results of the bone marrow biopsy.  However, given their concern regarding the patient's drop in hemoglobin today, it is reasonable for the patient to remain hospitalized for 1 more night for observation and repeat hemoglobin levels.    Physical Exam: Vitals:   08/17/24 1551 08/17/24 1900 08/17/24 1910 08/17/24 2135  BP: 115/66   117/89  Pulse: 78   87  Resp:    17  Temp: 98.2 F (36.8 C)   98.7 F (37.1 C)  TempSrc: Oral   Oral  SpO2: 93% 98% 100% 98%  Weight:      Height:       General: AAO x 3. HEENT: Pallor noted, oral mucosa moist Chest: Clear to auscultation bilaterally no crackles or wheezing. Cardiovascular: RRR, no murmurs Abdomen: Soft, nontender, normal bowel sounds Extremities: No edema CNS: No focal deficits   Family Communication: Phone conversation with patient's daughter, [name]  Disposition: Status is: Inpatient Remains inpatient appropriate because: Status post bone marrow biopsy, pending clinical improvement, patient with hemoglobin dropped from 8.6 to 8 today, plan to monitor for 1 more day.  Anticipated discharge 08/17/2024  Planned Discharge Destination: Skilled nursing facility    Time spent: 75 minutes  Author: Duffy Larch, MD 08/17/2024 11:00 PM  For on call review [email or web address].

## 2024-08-17 NOTE — TOC Progression Note (Signed)
 Transition of Care [facility]) - Progression Note    Patient Details  Name: Jorge Sherman MRN: [id number] Date of Birth: 04-01-1941  Transition of Care [facility]) CM/SW Contact  Schneur Crowson, [place], [place] Phone Number: 08/17/2024, 1:36 PM  Clinical Narrative:     Patient will not be discharging today-blood transfusion started. Plan for discharge to [facility] tomorrow, 08/18/24.  Laiyla Slagel, LCSW Transition of Care    Expected Discharge Plan: Skilled Nursing Facility Barriers to Discharge: Continued Medical Work up               Expected Discharge Plan and Services     Post Acute Care Choice: Skilled Nursing Facility Living arrangements for the past 2 months: Skilled Nursing Facility                                       Social Drivers of Health (SDOH) Interventions SDOH Screenings   Food Insecurity: No Food Insecurity (08/09/2024)  Housing: Low Risk  (08/08/2024)  Transportation Needs: No Transportation Needs (08/09/2024)  Utilities: Not At Risk (08/09/2024)  Financial Resource Strain: Low Risk  (11/01/2022)   Received from [facility]  Social Connections: Socially Isolated (08/09/2024)  Stress: No Stress Concern Present (11/01/2022)   Received from [facility]  Tobacco Use: Medium Risk (08/12/2024)    Readmission Risk Interventions     No data to display

## 2024-08-17 NOTE — Progress Notes (Signed)
 Blood transfusion started at the rate of 4ml/h at 1258 pm. RN at bed side. No reaction noted. Will continue to monitor through this shift.

## 2024-08-17 NOTE — Plan of Care (Signed)
  Problem: Education: Goal: Knowledge of General Education information will improve Description: Including pain rating scale, medication(s)/side effects and non-pharmacologic comfort measures Outcome: Progressing   Problem: Health Behavior/Discharge Planning: Goal: Ability to manage health-related needs will improve Outcome: Progressing   Problem: Clinical Measurements: Goal: Will remain free from infection Outcome: Progressing   Problem: Activity: Goal: Risk for activity intolerance will decrease Outcome: Progressing   Problem: Nutrition: Goal: Adequate nutrition will be maintained Outcome: Progressing   Problem: Elimination: Goal: Will not experience complications related to urinary retention Outcome: Progressing   Problem: Pain Managment: Goal: General experience of comfort will improve and/or be controlled Outcome: Progressing   Problem: Skin Integrity: Goal: Risk for impaired skin integrity will decrease Outcome: Progressing

## 2024-08-17 NOTE — Progress Notes (Signed)
 Blood is being transfused. RN at bed side. No reaction noted. Increased the rate to 125/ml after first 15 min. Pt remains in the bed. Will continue to monitor through this shift.

## 2024-08-17 NOTE — Progress Notes (Signed)
 Blood transfusion completed. Patient remains stable. No reaction noted.

## 2024-08-17 NOTE — Plan of Care (Signed)

## 2024-08-18 DIAGNOSIS — F419 Anxiety disorder, unspecified: Secondary | ICD-10-CM | POA: Diagnosis present

## 2024-08-18 DIAGNOSIS — D469 Myelodysplastic syndrome, unspecified: Secondary | ICD-10-CM | POA: Diagnosis present

## 2024-08-18 DIAGNOSIS — R490 Dysphonia: Secondary | ICD-10-CM | POA: Insufficient documentation

## 2024-08-18 DIAGNOSIS — D649 Anemia, unspecified: Secondary | ICD-10-CM | POA: Diagnosis not present

## 2024-08-18 DIAGNOSIS — I350 Nonrheumatic aortic (valve) stenosis: Secondary | ICD-10-CM

## 2024-08-18 LAB — TYPE AND SCREEN
ABO/RH(D): A POS
Antibody Screen: NEGATIVE
Unit division: 0

## 2024-08-18 LAB — CBC
HCT: 26.4 % — ABNORMAL LOW (ref 39.0–52.0)
Hemoglobin: 8.7 g/dL — ABNORMAL LOW (ref 13.0–17.0)
MCH: 28.6 pg (ref 26.0–34.0)
MCHC: 33 g/dL (ref 30.0–36.0)
MCV: 86.8 fL (ref 80.0–100.0)
Platelets: 104 K/uL — ABNORMAL LOW (ref 150–400)
RBC: 3.04 MIL/uL — ABNORMAL LOW (ref 4.22–5.81)
RDW: 15.3 % (ref 11.5–15.5)
WBC: 2.4 K/uL — ABNORMAL LOW (ref 4.0–10.5)
nRBC: 0 % (ref 0.0–0.2)

## 2024-08-18 LAB — BPAM RBC
Blood Product Expiration Date: 202510172359
ISSUE DATE / TIME: 202509271252
Unit Type and Rh: 6200

## 2024-08-18 NOTE — Progress Notes (Signed)
 Progress Note   Patient: Jorge Sherman FMW:[id number] DOB: [birth year] DOA: 08/08/2024     9 DOS: the patient was seen and examined on 08/18/2024   Brief hospital course: 83 y.o. male with past medical history significant for GI bleed, anemia, chronic pain, depression and anxiety, type 2 diabetes presented to hospital with dizziness and labs notable for hemoglobin of 6.8.  Patient was then out of hospital for symptomatic anemia.  At this time patient has undergone capsule endoscopy which was unrevealing. Underwent bone marrow biopsy on 08/14/2024.   Myelodysplastic Syndrome with Excess Blasts - Type 1 Pancytopenia Patient was evaluated by Dr. Gatha with oncology on 08/11/2024 who recommended a BM Bx, which was completed on 9/24. BM Bx results showed findings concerning for MDS-EB-1. These findings were explained to the patient in great detail - attempted to include the patient's daughter, [name], and son over the phone during that conversation but was unable to reach either of them.  - The patient will require close outpatient follow up with Heme/Onc on discharge  Symptomatic Anemia Recent GI bleed with melena.  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 Repeat  EDG 9/5 showed normal esophagus and stomach as well as the duodenal bulb and 2nd part of duodenum.  Small bowel AVM's were suspected. S/p 2 units of pRBC.   GI was consulted and capsule endoscopy on 9/19 showed mild inflammation in the stomach and proximal duodenum, duodenal diverticulum, scattered areas of fair visualization but no evidence of active bleeding. GI signed off with no plans for further evaluation on 9/20. - Continue twice daily PPI - Slight drop in Hgb to 7.8 from 8.0 although patient remains hemodynamically stable and asymptomatic with no hematochezia, melena, hematemesis.  - Given the patient's persistent slow down-trend in Hgb, now in the setting of known MDS, and through shared-decision making with the  patient, transfusion of 1 unit of pRBCs was ordered in order to augment the patient's Hgb reserve and potentially offer him more time until he can be seen by heme/onc and hopefully avoid readmission due to low Hgb levels.  - Ordered 1 unit pRBCs for transfusion.   Possible chronic liver disease. US  with nodular contour, coarse echotexture, and increased echogenicity - possible chronic liver disease.  INR of 1.1.  LFTs within normal limits.  Albumin low at 2.9.  - Will need outpatient follow-up with GI   Chronic Pain  Continue cymbalta , lyrica , morphine .  Muscle relaxant on hold, was on Butrans  patch.   Patient was discharged on 60 milligram cymbalta , 50 mg lyrica , morphine  15 mg q8 prn, butran 20 mcg/hr patch weekly on last admission.   Anxiety  Depression Continue BuSpar  Cymbalta  and Ativan    History of rheumatoid arthritis Hydroxychloroquine  on hold    BPH  Continue Flomax    Diabetes mellitus type 2. Review of recent hemoglobin A1c 01/2021 was 5.4   Moderate to severe aortic stenosis. Recommend follow-up with cardiology as outpatient.   Muscle Tension Dysphonia Seen by ENT on 08/08/2023.  Follow-up with ENT speech-language therapy as outpatient at [facility] for voice therapy  DVT prophylaxis: SCDs   Subjective: Patient seen laying in bed. No acute events overnight. Denies any abdominal pain, nausea, vomiting, hematochezia, melena, hematemesis. Spoke to the patient with his daughter, [name], on the phone. Discussed the patient's medical readiness for discharge. The patient and his daughter were agreeable with discharge.   AFTERNOON UPDATE: The patient was discharged in the afternoon. The patient was awaiting transportation  back to his SNF for several hours and expressed to his nurse that given that it was getting to be late in the evening, he would rather spend the night in order to avoid going back to the SNF late in the day. The discharge order was discontinued.     Physical Exam: Vitals:   08/18/24 0827 08/18/24 1117 08/18/24 1739 08/18/24 2000  BP: 137/70 132/69 110/73 119/72  Pulse: 78 82 82 80  Resp:    16  Temp: 97.6 F (36.4 C)   98 F (36.7 C)  TempSrc: Oral   Oral  SpO2: 100% 99% 100% 100%  Weight:      Height:       General: AAO x 3. HEENT: Pallor noted, oral mucosa moist Chest: Clear to auscultation bilaterally no crackles or wheezing. Cardiovascular: RRR, no murmurs Abdomen: Soft, nontender, normal bowel sounds Extremities: No edema CNS: No focal deficits   Family Communication: Spoke to the patient with his daughter on the phone  Disposition: Status is: Inpatient Patient was medically cleared and ready for discharge today. However, due to a delay in transportation back to the facility, he requested staying overnight as opposed to returning to the facility late in the evening.   Planned Discharge Destination: Skilled nursing facility    Time spent: 58 minutes  Author: Naesha Buckalew Al-Sultani, MD 08/18/2024 11:00 PM  For on call review [email or web address].

## 2024-08-18 NOTE — Progress Notes (Signed)
 [facility] called to transport patient to [facility]. Paperwork at front desk.

## 2024-08-18 NOTE — Discharge Summary (Addendum)
 Physician Discharge Summary   Patient: [facility] MRN: [id number] DOB: 07-25-1941  Admit date:     08/08/2024  Discharge date: 08/19/24  Discharge Physician: Duffy Al-Sultani   PCP: [facility] LABOR, MD   Recommendations at discharge:   Follow-up with hematology/oncology to review results of bone marrow biopsy continue monitoring of hemoglobin levels Follow-up with cardiology for moderate-severe aortic stenosis Follow-up with primary gastroenterology provider for anemia and possible liver cirrhosis Follow-up with [facility] ENT for voice therapy  Discharge Diagnoses: Principal Problem:   Symptomatic anemia Active Problems:   BPH (benign prostatic hyperplasia)   Depression   Chronic pain syndrome   Myelodysplastic syndrome (HCC)   Anxiety   Aortic stenosis   Muscle tension dysphonia   Hospital Course: The patient is an 83 y.o. male [facility] with a past medical history significant for GI bleed, GAVE s/p APC, recurrent symptomatic anemia, chronic pain, depression and anxiety, presented to hospital with dizziness and labs drawn from the facility notable for a hemoglobin of 6.8.    Symptomatic Anemia Recent GI bleed with melena.  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 Repeat  EDG 9/5 showed normal esophagus and stomach as well as the duodenal bulb and 2nd part of duodenum.  Small bowel AVM's were suspected as the cause of persistent drop in Hgb. GI was consulted and capsule endoscopy was performed on 9/19 which showed mild inflammation in the stomach and proximal duodenum, duodenal diverticulum, scattered areas of fair visualization but no evidence of active bleeding. GI signed off with no plans for further evaluation on 9/20. Over the course of his hospitalization, the patient received 3 units of blood due to fluctuations in his Hgb with recurrent drops. However, the patient remained hemodynamically stable and asymptomatic with no  hematochezia, melena, hematemesis. At the time of discharge, the patient's Hgb was 8.7. The patient was hemodynamically stable and asymptomatic without any signs of acute blood loss, and was approved for discharge.  Myelodysplastic Syndrome with Excess Blasts - Type 1 Pancytopenia The patient was evaluated by Dr. Gatha with oncology on 08/11/2024 who recommended a BM Bx, which was completed on 9/24. BM Bx results showed findings concerning for MDS-EB-1. These findings were explained to the patient and his daughter, [name], in great detail. The patient will require close outpatient follow up with Heme/Onc on discharge.  He was provided referral to the heme/onc clinic.   Possible chronic liver disease. Liver US  showed nodular contour, coarse echotexture, and increased echogenicity concerning fo possible chronic liver disease.  INR of 1.1.  LFTs within normal limits.  Will need outpatient follow-up with primary GI provider.     History of rheumatoid arthritis Hydroxychloroquine  on hold due to pancytopenia  Moderate to severe aortic stenosis. Recommended follow-up with cardiology as outpatient.   Muscle Tension Dysphonia Seen by ENT on 08/08/2023 with recommendation to follow up with SLP outpatient at [facility] for voice therapy.      Consultants: Hematology/oncology, gastroenterology Procedures performed: Bone marrow biopsy, capsule endoscopy Disposition: Skilled nursing facility Diet recommendation:   DISCHARGE MEDICATION: Allergies as of 08/19/2024       Reactions   Cefepime Rash   Redness   Iohexol  Hives, Other (See Comments)   A few hives post CT   Fentanyl  Itching, Nausea And Vomiting, Other (See Comments)   Patient states that it happened over 30 yrs ago   Iodine Hives, Rash        Medication  List     PAUSE taking these medications    hydroxychloroquine  200 MG tablet Wait to take this until your doctor or other care provider tells you to start  again. Commonly known as: PLAQUENIL  Take 200 mg by mouth 2 (two) times daily.       STOP taking these medications    OXYGEN    senna 8.6 MG tablet Commonly known as: SENOKOT       TAKE these medications    acetaminophen  500 MG tablet Commonly known as: TYLENOL  Take 2 tablets (1,000 mg total) by mouth every 6 (six) hours as needed for headache, mild pain or fever. What changed: when to take this   albuterol  108 (90 Base) MCG/ACT inhaler Commonly known as: VENTOLIN  HFA Inhale 2 puffs into the lungs every 4 (four) hours as needed for wheezing or shortness of breath.   ascorbic acid  500 MG tablet Commonly known as: VITAMIN C Take 500 mg by mouth every morning.   atorvastatin  80 MG tablet Commonly known as: LIPITOR  Take 80 mg by mouth at bedtime.   busPIRone  15 MG tablet Commonly known as: BUSPAR  Take 15 mg by mouth 3 (three) times daily.   Cranberry 450 MG Tabs Take 450 mg by mouth in the morning.   Cyanocobalamin 5000 MCG Tbdp Place 5,000 mcg under the tongue every morning.   cyclobenzaprine 10 MG tablet Commonly known as: FLEXERIL Take 10 mg by mouth 3 (three) times daily as needed for muscle spasms.   DSS 100 MG Caps Take 100 mg by mouth in the morning and at bedtime.   DULoxetine  60 MG capsule Commonly known as: CYMBALTA  Take 60 mg by mouth daily.   ferrous sulfate  325 (65 FE) MG tablet Take 325 mg by mouth daily.   fluticasone 50 MCG/ACT nasal spray Commonly known as: FLONASE Place 1 spray into the nose daily.   folic acid  400 MCG tablet Commonly known as: FOLVITE  Take 400 mcg by mouth every morning.   guaiFENesin 100 MG/5ML liquid Commonly known as: ROBITUSSIN Take 10 mLs by mouth every 4 (four) hours as needed for cough.   lidocaine  4 % Place 1 patch onto the skin in the morning.   Linzess  145 MCG Caps capsule Generic drug: linaclotide  Take 145 mcg by mouth daily.   loratadine 10 MG tablet Commonly known as: CLARITIN Take 10 mg by  mouth in the morning.   LORazepam  0.5 MG tablet Commonly known as: ATIVAN  Take 0.5 mg by mouth 2 (two) times daily.   melatonin 3 MG Tabs tablet Take 3 mg by mouth at bedtime.   morphine  15 MG tablet Commonly known as: MSIR Take 15 mg by mouth every 8 (eight) hours as needed for severe pain (pain score 7-10).   multivitamin with minerals Tabs tablet Take 1 tablet by mouth daily.   NATURAL BALANCE TEARS OP Place 2 drops into both eyes in the morning and at bedtime.   ondansetron  4 MG tablet Commonly known as: ZOFRAN  Take 1 tablet (4 mg total) by mouth every 6 (six) hours as needed for nausea.   pantoprazole  40 MG tablet Commonly known as: PROTONIX  Take 1 tablet (40 mg total) by mouth daily.   polyethylene glycol 17 g packet Commonly known as: MIRALAX  / GLYCOLAX  Take 17 g by mouth daily as needed for mild constipation. What changed: when to take this   pregabalin  75 MG capsule Commonly known as: LYRICA  Take 75 mg by mouth 2 (two) times daily.   senna-docusate 8.6-50 MG  tablet Commonly known as: Senokot-S Take 2 tablets by mouth 2 (two) times daily.   SUMAtriptan  50 MG tablet Commonly known as: IMITREX  Take 50 mg by mouth every 2 (two) hours as needed for migraine or headache.   tamsulosin  0.4 MG Caps capsule Commonly known as: FLOMAX  Take 0.4 mg by mouth every morning.   thiamine  100 MG tablet Commonly known as: VITAMIN B1 Take 1 tablet (100 mg total) by mouth daily.   traZODone  50 MG tablet Commonly known as: DESYREL  Take 50 mg by mouth at bedtime.        Contact information for follow-up providers     [name] [name], MD. Schedule an appointment as soon as possible for a visit in 3 day(s).   Specialty: Oncology Why: Please set up an appointment ASAP to review bone marrow biopsy results and monitor hemoglobin levels Contact information: [address] [place] [address] [phone]         Center, [facility]  Schedule an appointment as soon as possible for a visit.   Why: Please follow up at [facility] for Voice Therapy Contact information: [address] [place] [address] [phone]         Cardiology Follow up.   Why: Please follow up with cardiology for findings of moderate to severe aortic stenosis - referral was made during last hospitalization at [facility] information for after-discharge care     Destination     [facility]   Service: Skilled Nursing Contact information: [address] [facility] [address] [place]  [address] [phone]                    Discharge Exam: Filed Weights   08/08/24 1743 08/08/24 1847 08/09/24 1603  Weight: 107 kg 97.3 kg 97.3 kg   General: AAO x 3. HEENT: Pallor noted, oral mucosa moist Chest: Clear to auscultation bilaterally no crackles or wheezing. Cardiovascular: RRR, no murmurs Abdomen: Soft, nontender, normal bowel sounds Extremities: No edema CNS: No focal deficits  Condition at discharge: good  The results of significant diagnostics from this hospitalization (including imaging, microbiology, ancillary and laboratory) are listed below for reference.   Imaging Studies: IR BONE MARROW BIOPSY & ASPIRATION Result Date: 08/14/2024 INDICATION: 83 year old male with history of pancytopenia peer EXAM: FLUOROSCOPIC-GUIDED BONE MARROW BIOPSY AND ASPIRATION MEDICATIONS: None ANESTHESIA/SEDATION: Fentanyl  75 mcg IV; Versed  2 mg IV Sedation Time: 4 minutes; The patient was continuously monitored during the procedure by the interventional radiology nurse under my direct supervision. FLUOROSCOPY: 3 mGy reference ir, COMPLICATIONS: None immediate. PROCEDURE: Informed consent was obtained from the patient following an explanation of the procedure, risks, benefits and alternatives. The patient understands, agrees and consents for the procedure. All questions were  addressed. A time out was performed prior to the initiation of the procedure. The patient was positioned prone and the left iliac marrow space was identified fluoroscopically. The operative site was prepped and draped in the usual sterile fashion. Under sterile conditions and local anesthesia, a 22 gauge spinal needle was utilized for procedural planning. Next, an 11 gauge coaxial bone biopsy needle was advanced into the left iliac marrow space. Initially, a bone marrow aspiration was performed. Next, a bone marrow biopsy was obtained with the 11 gauge outer bone marrow device. The needle was removed and superficial hemostasis was obtained with manual compression. A dressing was applied. The  patient tolerated the procedure well without immediate post procedural complication. IMPRESSION: Successful fluoroscopic guided left iliac bone marrow aspiration and core biopsy. Ester Sides, MD [facility] Electronically Signed   By: Ester Sides M.D.   On: 08/14/2024 12:07    Microbiology: Results for orders placed or performed during the hospital encounter of 08/08/24  MRSA Next Gen by PCR, Nasal     Status: Abnormal   Collection Time: 08/14/24 10:00 AM   Specimen: Nasal Mucosa; Nasal Swab  Result Value Ref Range Status   MRSA by PCR Next Gen DETECTED (A) NOT DETECTED Final    Comment: RESULT CALLED TO, READ BACK BY AND VERIFIED WITH: RN MYRTIS GRADE [phone] @ [phone] (NOTE) The GeneXpert MRSA Assay (FDA approved for NASAL specimens only), is one component of a comprehensive MRSA colonization surveillance program. It is not intended to diagnose MRSA infection nor to guide or monitor treatment for MRSA infections. Test performance is not FDA approved in patients less than 80 years old. Performed at [facility], [address], [place], [place] [address]     Labs: CBC: Recent Labs  Lab 08/13/24 0510 08/14/24 0532 08/15/24 9372  08/16/24 0530 08/17/24 0149 08/17/24 1909 08/18/24 0426  WBC 2.1* 2.0* 2.2* 2.0* 2.0*  --  2.4*  NEUTROABS 0.8* 0.8* 1.0* 0.7*  --   --   --   HGB 7.6* 8.2* 8.6* 8.0* 7.8* 9.6* 8.7*  HCT 23.5* 24.7* 26.5* 24.5* 24.3* 29.3* 26.4*  MCV 89.7 87.6 90.4 88.4 88.7  --  86.8  PLT 112* 96* 88* 91* 98*  --  104*   Basic Metabolic Panel: Recent Labs  Lab 08/13/24 0510 08/14/24 0532  NA 132* 133*  K 3.9 4.0  CL 101 101  CO2 26 27  GLUCOSE 117* 103*  BUN 21 19  CREATININE 1.14 1.07  CALCIUM  8.5* 8.5*  MG 1.8 1.8  PHOS 4.0 3.7   Liver Function Tests: Recent Labs  Lab 08/13/24 0510 08/14/24 0532  AST 15 16  ALT 8 10  ALKPHOS 63 63  BILITOT 0.7 0.8  PROT 6.1* 6.0*  ALBUMIN 3.0* 2.9*   CBG: No results for input(s): GLUCAP in the last 168 hours.  Discharge time spent: 60 minutes  Signed: Geniva Lohnes Al-Sultani, MD [facility] 08/19/2024

## 2024-08-18 NOTE — Plan of Care (Signed)

## 2024-08-18 NOTE — Progress Notes (Signed)
 Patient complained difficulty in breathing. RN checked with the patient.. SPO2 is 98% at rest.. Daughter and son at bed side. Requested to have O2 for the pt's comfort. Started with 1L nasal canula.

## 2024-08-18 NOTE — Plan of Care (Signed)
  Problem: Education: Goal: Knowledge of General Education information will improve Description: Including pain rating scale, medication(s)/side effects and non-pharmacologic comfort measures Outcome: Progressing   Problem: Health Behavior/Discharge Planning: Goal: Ability to manage health-related needs will improve Outcome: Progressing   Problem: Clinical Measurements: Goal: Diagnostic test results will improve Outcome: Progressing   Problem: Clinical Measurements: Goal: Respiratory complications will improve Outcome: Progressing   Problem: Activity: Goal: Risk for activity intolerance will decrease Outcome: Progressing   Problem: Nutrition: Goal: Adequate nutrition will be maintained Outcome: Progressing   Problem: Coping: Goal: Level of anxiety will decrease Outcome: Progressing   Problem: Elimination: Goal: Will not experience complications related to urinary retention Outcome: Progressing   Problem: Pain Managment: Goal: General experience of comfort will improve and/or be controlled Outcome: Progressing   Problem: Safety: Goal: Ability to remain free from injury will improve Outcome: Progressing   Problem: Skin Integrity: Goal: Risk for impaired skin integrity will decrease Outcome: Progressing

## 2024-08-18 NOTE — Progress Notes (Signed)
Report called to the facility.

## 2024-08-19 ENCOUNTER — Telehealth: Payer: Self-pay | Admitting: Medical Oncology

## 2024-08-19 ENCOUNTER — Other Ambulatory Visit: Payer: Self-pay | Admitting: Medical Oncology

## 2024-08-19 DIAGNOSIS — D469 Myelodysplastic syndrome, unspecified: Secondary | ICD-10-CM

## 2024-08-19 DIAGNOSIS — D649 Anemia, unspecified: Secondary | ICD-10-CM | POA: Diagnosis not present

## 2024-08-19 NOTE — Progress Notes (Signed)
 Spoke with Nurse Therisa from [facility] and report was given. [facility] has been called and patient is waiting to be transported.

## 2024-08-19 NOTE — Plan of Care (Signed)

## 2024-08-19 NOTE — Telephone Encounter (Signed)
 Appts this week- Dtr asking about next appt. I LVM that [facility] is scheduled for labs and [facility] and possible blood transfusion on thursday . I asked her to contact [facility] to arrange transportation for his appointments. Schedule message sent for  blood transfusion-1 unit on Thursday .

## 2024-08-19 NOTE — Progress Notes (Signed)
Labs ordered for Thursday.

## 2024-08-19 NOTE — Telephone Encounter (Signed)
 Pt to be discharged today to [facility]. Pt needs f/u appt for MDS and frequent labs. Contacted dtr , [facility] , and assured her pt will be seen this week and if he needs blood he will get it scheduled. . Either she or her brother , [facility], will be available during pts f/u appt.   This nurse contacted [name] and LVM to return my call re pt f/u appts at the [facility] for labs, Dr. [name] and possible blood transfusion later this week .

## 2024-08-20 ENCOUNTER — Telehealth: Payer: Self-pay

## 2024-08-20 ENCOUNTER — Telehealth: Payer: Self-pay | Admitting: Internal Medicine

## 2024-08-20 NOTE — Telephone Encounter (Signed)
 Spoke with Rose at [facility] facility to confirm the patient's upcoming appointments on Thursday, 08/22/24: labs at 11:00 A.M., visit with Dr. [name] at 11:30 A.M., and a possible blood transfusion at 12:00 P.M. Rose inquired about the expected duration of the appointments. Informed her that, if the blood transfusion is performed, the patient may be finished around 3:00 P.M. Rose voiced understanding.

## 2024-08-20 NOTE — Telephone Encounter (Signed)
 Scheduled appointment per staff message. Called and left VM for the patient with appointment details and the reason for the call.

## 2024-08-20 NOTE — Telephone Encounter (Signed)
 Please see below. thanks

## 2024-08-21 ENCOUNTER — Encounter (HOSPITAL_COMMUNITY): Payer: Self-pay | Admitting: Internal Medicine

## 2024-08-22 ENCOUNTER — Other Ambulatory Visit

## 2024-08-22 ENCOUNTER — Inpatient Hospital Stay: Attending: Internal Medicine | Admitting: Internal Medicine

## 2024-08-22 ENCOUNTER — Inpatient Hospital Stay

## 2024-08-22 VITALS — BP 116/68 | HR 65 | Temp 97.7°F | Resp 17 | Ht 70.0 in | Wt 209.3 lb

## 2024-08-22 DIAGNOSIS — D469 Myelodysplastic syndrome, unspecified: Secondary | ICD-10-CM

## 2024-08-22 DIAGNOSIS — R0609 Other forms of dyspnea: Secondary | ICD-10-CM | POA: Insufficient documentation

## 2024-08-22 DIAGNOSIS — Z885 Allergy status to narcotic agent status: Secondary | ICD-10-CM | POA: Diagnosis not present

## 2024-08-22 DIAGNOSIS — Z881 Allergy status to other antibiotic agents status: Secondary | ICD-10-CM | POA: Insufficient documentation

## 2024-08-22 DIAGNOSIS — D692 Other nonthrombocytopenic purpura: Secondary | ICD-10-CM | POA: Insufficient documentation

## 2024-08-22 DIAGNOSIS — Z888 Allergy status to other drugs, medicaments and biological substances status: Secondary | ICD-10-CM | POA: Insufficient documentation

## 2024-08-22 DIAGNOSIS — Z79899 Other long term (current) drug therapy: Secondary | ICD-10-CM | POA: Diagnosis not present

## 2024-08-22 DIAGNOSIS — R5383 Other fatigue: Secondary | ICD-10-CM | POA: Insufficient documentation

## 2024-08-22 DIAGNOSIS — Z91041 Radiographic dye allergy status: Secondary | ICD-10-CM | POA: Diagnosis not present

## 2024-08-22 DIAGNOSIS — Z5111 Encounter for antineoplastic chemotherapy: Secondary | ICD-10-CM | POA: Insufficient documentation

## 2024-08-22 LAB — CBC WITH DIFFERENTIAL (CANCER CENTER ONLY)
Abs Immature Granulocytes: 0 K/uL (ref 0.00–0.07)
Basophils Absolute: 0 K/uL (ref 0.0–0.1)
Basophils Relative: 0 %
Eosinophils Absolute: 0 K/uL (ref 0.0–0.5)
Eosinophils Relative: 2 %
HCT: 28.6 % — ABNORMAL LOW (ref 39.0–52.0)
Hemoglobin: 9.1 g/dL — ABNORMAL LOW (ref 13.0–17.0)
Immature Granulocytes: 0 %
Lymphocytes Relative: 43 %
Lymphs Abs: 1 K/uL (ref 0.7–4.0)
MCH: 28.3 pg (ref 26.0–34.0)
MCHC: 31.8 g/dL (ref 30.0–36.0)
MCV: 88.8 fL (ref 80.0–100.0)
Monocytes Absolute: 0.1 K/uL (ref 0.1–1.0)
Monocytes Relative: 5 %
Neutro Abs: 1.1 K/uL — ABNORMAL LOW (ref 1.7–7.7)
Neutrophils Relative %: 50 %
Platelet Count: 140 K/uL — ABNORMAL LOW (ref 150–400)
RBC: 3.22 MIL/uL — ABNORMAL LOW (ref 4.22–5.81)
RDW: 15.2 % (ref 11.5–15.5)
WBC Count: 2.2 K/uL — ABNORMAL LOW (ref 4.0–10.5)
nRBC: 0 % (ref 0.0–0.2)

## 2024-08-22 LAB — CMP (CANCER CENTER ONLY)
ALT: 9 U/L (ref 0–44)
AST: 13 U/L — ABNORMAL LOW (ref 15–41)
Albumin: 3.9 g/dL (ref 3.5–5.0)
Alkaline Phosphatase: 79 U/L (ref 38–126)
Anion gap: 2 — ABNORMAL LOW (ref 5–15)
BUN: 25 mg/dL — ABNORMAL HIGH (ref 8–23)
CO2: 33 mmol/L — ABNORMAL HIGH (ref 22–32)
Calcium: 9.6 mg/dL (ref 8.9–10.3)
Chloride: 100 mmol/L (ref 98–111)
Creatinine: 1.13 mg/dL (ref 0.61–1.24)
GFR, Estimated: >60 mL/min (ref >60)
Glucose, Bld: 108 mg/dL — ABNORMAL HIGH (ref 70–99)
Potassium: 4.4 mmol/L (ref 3.5–5.1)
Sodium: 135 mmol/L (ref 135–145)
Total Bilirubin: 0.5 mg/dL (ref 0.0–1.2)
Total Protein: 7.4 g/dL (ref 6.5–8.1)

## 2024-08-22 LAB — SAMPLE TO BLOOD BANK

## 2024-08-22 MED ORDER — PROCHLORPERAZINE MALEATE 10 MG PO TABS: 10.0000 mg | ORAL_TABLET | Freq: Four times a day (QID) | ORAL | 1 refills | Status: DC | PRN

## 2024-08-22 MED ORDER — ONDANSETRON HCL 8 MG PO TABS: 8.0000 mg | ORAL_TABLET | Freq: Three times a day (TID) | ORAL | 1 refills | Status: DC | PRN

## 2024-08-22 NOTE — Progress Notes (Signed)
 [facility] [phone]   Fax:([phone]  OFFICE PROGRESS NOTE  [name] LABOR, MD [address] [place] [address]  DIAGNOSIS: Myelodysplastic syndrome with excess blast (MDS-ES-I diagnosed in September 2025. IPSS-M: High risk (5.79)  PRIOR THERAPY:  CURRENT THERAPY:  INTERVAL HISTORY: Jorge Sherman 83 y.o. male returns to the clinic today for hospital follow-up visit accompanied by his daughter [name]. Discussed the use of AI scribe software for clinical note transcription with the patient, who gave verbal consent to proceed.  History of Present Illness Jorge Sherman is an 83 year old male with myelodysplastic syndrome with excess blasts who presents for evaluation and hospital follow-up visit.  He was diagnosed with myelodysplastic syndrome with excess blasts in September 2025, classified as high risk according to the IPSS-M risk calculator. He feels 'pretty good' but experiences some lethargy. His hemoglobin level is currently 9.1, the highest it has been. A bone marrow biopsy confirmed the diagnosis.  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 He owns a condo that he rents out and is considering selling it to his daughter. Family visits are anticipated soon, including his younger brother who is 62 and an older brother who is 74.     MEDICAL HISTORY: Past Medical History:  Diagnosis Date   Chronic pain    Depressive disorder, not elsewhere classified    Dizziness and giddiness    Essential and other specified forms of tremor    Headache(784.0)    Leukocytosis, unspecified    Obstructive sleep apnea (adult) (pediatric)    Pain in joint, lower leg    Septic joint of left knee joint (HCC) 04/22/2019   Spinal stenosis, lumbar region, without  neurogenic claudication    Thoracic or lumbosacral neuritis or radiculitis, unspecified    Type II or unspecified type diabetes mellitus without mention of complication, not stated as uncontrolled    Unspecified cardiovascular disease    Unspecified essential hypertension     ALLERGIES:  is allergic to cefepime, iodinated contrast media, iohexol , tositumomab, fentanyl , and iodine.  MEDICATIONS:  Current Outpatient Medications  Medication Sig Dispense Refill   acetaminophen  (TYLENOL ) 500 MG tablet Take 2 tablets (1,000 mg total) by mouth every 6 (six) hours as needed for headache, mild pain or fever. (Patient taking differently: Take 1,000 mg by mouth every 8 (eight) hours as needed for headache, mild pain (pain score 1-3) or fever.) 30 tablet 0   albuterol  (VENTOLIN  HFA) 108 (90 Base) MCG/ACT inhaler Inhale 2 puffs into the lungs every 4 (four) hours as needed for wheezing or shortness of breath. 1 each 0   ascorbic acid  (VITAMIN C) 500 MG tablet Take 500 mg by mouth every morning.     atorvastatin  (LIPITOR ) 80 MG tablet Take 80 mg by mouth at bedtime.     busPIRone  (BUSPAR ) 15 MG tablet Take 15 mg by mouth 3 (three) times daily.     Cranberry 450 MG TABS Take 450 mg by mouth in the morning.     Cyanocobalamin 5000 MCG TBDP Place 5,000 mcg under the tongue every morning.     cyclobenzaprine (FLEXERIL) 10 MG tablet Take 10 mg by mouth 3 (three) times daily as needed for muscle spasms.     Docusate Sodium  (DSS) 100 MG  CAPS Take 100 mg by mouth in the morning and at bedtime.     DULoxetine  (CYMBALTA ) 60 MG capsule Take 60 mg by mouth daily.     ferrous sulfate  325 (65 FE) MG tablet Take 325 mg by mouth daily.      fluticasone (FLONASE) 50 MCG/ACT nasal spray Place 1 spray into the nose daily.     folic acid  (FOLVITE ) 400 MCG tablet Take 400 mcg by mouth every morning.     guaiFENesin (ROBITUSSIN) 100 MG/5ML liquid Take 10 mLs by mouth every 4 (four) hours as needed for cough.     [Paused]  hydroxychloroquine  (PLAQUENIL ) 200 MG tablet Take 200 mg by mouth 2 (two) times daily.     Hypromellose (NATURAL BALANCE TEARS OP) Place 2 drops into both eyes in the morning and at bedtime.     lidocaine  4 % Place 1 patch onto the skin in the morning.     LINZESS  145 MCG CAPS capsule Take 145 mcg by mouth daily.     loratadine (CLARITIN) 10 MG tablet Take 10 mg by mouth in the morning.     LORazepam  (ATIVAN ) 0.5 MG tablet Take 0.5 mg by mouth 2 (two) times daily.     melatonin 3 MG TABS tablet Take 3 mg by mouth at bedtime.     morphine  (MSIR) 15 MG tablet Take 15 mg by mouth every 8 (eight) hours as needed for severe pain (pain score 7-10).     Multiple Vitamin (MULTIVITAMIN WITH MINERALS) TABS tablet Take 1 tablet by mouth daily.     ondansetron  (ZOFRAN ) 4 MG tablet Take 1 tablet (4 mg total) by mouth every 6 (six) hours as needed for nausea. 20 tablet 0   pantoprazole  (PROTONIX ) 40 MG tablet Take 1 tablet (40 mg total) by mouth daily.     polyethylene glycol (MIRALAX  / GLYCOLAX ) 17 g packet Take 17 g by mouth daily as needed for mild constipation. (Patient taking differently: Take 17 g by mouth daily.) 14 each 0   pregabalin  (LYRICA ) 75 MG capsule Take 75 mg by mouth 2 (two) times daily.     senna-docusate (SENOKOT-S) 8.6-50 MG tablet Take 2 tablets by mouth 2 (two) times daily.     SUMAtriptan  (IMITREX ) 50 MG tablet Take 50 mg by mouth every 2 (two) hours as needed for migraine or headache.     tamsulosin  (FLOMAX ) 0.4 MG CAPS capsule Take 0.4 mg by mouth every morning.     thiamine  100 MG tablet Take 1 tablet (100 mg total) by mouth daily. 30 tablet 0   traZODone  (DESYREL ) 50 MG tablet Take 50 mg by mouth at bedtime.     No current facility-administered medications for this visit.    SURGICAL HISTORY:  Past Surgical History:  Procedure Laterality Date   BACK SURGERY     back surgey     times 3   GIVENS CAPSULE STUDY N/A 08/09/2024   Procedure: IMAGING PROCEDURE, GI TRACT,  INTRALUMINAL, VIA CAPSULE;  Surgeon: Rosalie Kitchens, MD;  Location: [facility] ENDOSCOPY;  Service: Gastroenterology;  Laterality: N/A;   IR BONE MARROW BIOPSY & ASPIRATION  08/14/2024   IR INJECT/THERA/INC NEEDLE/CATH/PLC EPI/LUMB/SAC W/IMG  11/26/2020    REVIEW OF SYSTEMS:  Constitutional: positive for fatigue Eyes: negative Ears, nose, mouth, throat, and face: negative Respiratory: positive for dyspnea on exertion Cardiovascular: negative Gastrointestinal: negative Genitourinary:negative Integument/breast: positive for senile purpura Hematologic/lymphatic: positive for easy bruising Musculoskeletal:negative Neurological: negative Behavioral/Psych: negative Endocrine: negative Allergic/Immunologic: negative   PHYSICAL  EXAMINATION: General appearance: alert, cooperative, fatigued, and no distress Head: Normocephalic, without obvious abnormality, atraumatic Neck: no adenopathy, no JVD, supple, symmetrical, trachea midline, and thyroid  not enlarged, symmetric, no tenderness/mass/nodules Lymph nodes: Cervical, supraclavicular, and axillary nodes normal. Resp: clear to auscultation bilaterally Back: symmetric, no curvature. ROM normal. No CVA tenderness. Cardio: regular rate and rhythm, S1, S2 normal, no murmur, click, rub or gallop GI: soft, non-tender; bowel sounds normal; no masses,  no organomegaly Extremities: extremities normal, atraumatic, no cyanosis or edema and ecchymosis in the upper extremities Neurologic: Alert and oriented X 3, normal strength and tone. Normal symmetric reflexes. Normal coordination and gait  ECOG PERFORMANCE STATUS: 1 - Symptomatic but completely ambulatory  Blood pressure 116/68, pulse 65, temperature 97.7 F (36.5 C), resp. rate 17, height 5' 10 (1.778 m), weight 209 lb 4.8 oz (94.9 kg), SpO2 98%.  LABORATORY DATA: Lab Results  Component Value Date   WBC 2.2 (L) 08/22/2024   HGB 9.1 (L) 08/22/2024   HCT 28.6 (L) 08/22/2024   MCV 88.8 08/22/2024   PLT 140  (L) 08/22/2024      Chemistry      Component Value Date/Time   NA 133 (L) 08/14/2024 0532   K 4.0 08/14/2024 0532   CL 101 08/14/2024 0532   CO2 27 08/14/2024 0532   BUN 19 08/14/2024 0532   CREATININE 1.07 08/14/2024 0532      Component Value Date/Time   CALCIUM  8.5 (L) 08/14/2024 0532   ALKPHOS 63 08/14/2024 0532   AST 16 08/14/2024 0532   ALT 10 08/14/2024 0532   BILITOT 0.8 08/14/2024 0532       RADIOGRAPHIC STUDIES: IR BONE MARROW BIOPSY & ASPIRATION Result Date: 08/14/2024 INDICATION: 83 year old male with history of pancytopenia peer EXAM: FLUOROSCOPIC-GUIDED BONE MARROW BIOPSY AND ASPIRATION MEDICATIONS: None ANESTHESIA/SEDATION: Fentanyl  75 mcg IV; Versed  2 mg IV Sedation Time: 4 minutes; The patient was continuously monitored during the procedure by the interventional radiology nurse under my direct supervision. FLUOROSCOPY: 3 mGy reference ir, COMPLICATIONS: None immediate. PROCEDURE: Informed consent was obtained from the patient following an explanation of the procedure, risks, benefits and alternatives. The patient understands, agrees and consents for the procedure. All questions were addressed. A time out was performed prior to the initiation of the procedure. The patient was positioned prone and the left iliac marrow space was identified fluoroscopically. The operative site was prepped and draped in the usual sterile fashion. Under sterile conditions and local anesthesia, a 22 gauge spinal needle was utilized for procedural planning. Next, an 11 gauge coaxial bone biopsy needle was advanced into the left iliac marrow space. Initially, a bone marrow aspiration was performed. Next, a bone marrow biopsy was obtained with the 11 gauge outer bone marrow device. The needle was removed and superficial hemostasis was obtained with manual compression. A dressing was applied. The patient tolerated the procedure well without immediate post procedural complication. IMPRESSION: Successful  fluoroscopic guided left iliac bone marrow aspiration and core biopsy. Ester Sides, MD [facility]iology Electronically Signed   By: Ester Sides M.D.   On: 08/14/2024 12:07    ASSESSMENT AND PLAN: This is a very pleasant 83 years old white male diagnosed with myelodysplastic syndrome with excess blasts and high risk IPSS-M in September 2025. I had a lengthy discussion with the patient and his daughter about his current condition and treatment options.  Assessment and Plan Assessment & Plan Myelodysplastic syndrome with excess blasts, high-risk IPSS-M with associated anemia  Diagnosed in September 2025, classified as high-risk IPSS-M with a leukemia-free survival of approximately 1.5 years and an average survival of 1.7 years. There is a 14.3% chance of transformation to acute myeloid leukemia within one year. Bone marrow transplant is not an option due to age. - Initiate azacitidine treatment starting September 02, 2024, with subcutaneous injections for 7 consecutive days every 4 weeks. - Arrange for insurance approval and schedule a chemo class for education on azacitidine treatment. - Monitor blood counts regularly and provide transfusions if hemoglobin drops significantly. - Provide access to MyChart for monitoring lab results and treatment information. For the anemia, we will continue with supportive care and transfusion on as-needed basis. The patient was advised to call immediately if he has any other concerning symptoms in the interval. The patient voices understanding of current disease status and treatment options and is in agreement with the current care plan.  All questions were answered. The patient knows to call the clinic with any problems, questions or concerns. We can certainly see the patient much sooner if necessary. The total time spent in the appointment was 55 minutes including review of chart and various tests results,  discussions about plan of care and coordination of care plan .   Disclaimer: This note was dictated with voice recognition software. Similar sounding words can inadvertently be transcribed and may not be corrected upon review.

## 2024-08-22 NOTE — Progress Notes (Signed)
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 

## 2024-08-26 ENCOUNTER — Encounter: Payer: Self-pay | Admitting: Internal Medicine

## 2024-08-26 NOTE — Progress Notes (Signed)
 Pharmacist Chemotherapy Monitoring - Initial Assessment    Anticipated start date: 09/02/24  The following has been reviewed per standard work regarding the patient's treatment regimen: The patient's diagnosis, treatment plan and drug doses, and organ/hematologic function Lab orders and baseline tests specific to treatment regimen  The treatment plan start date, drug sequencing, and pre-medications Prior authorization status  Patient's documented medication list, including drug-drug interaction screen and prescriptions for anti-emetics and supportive care specific to the treatment regimen The drug concentrations, fluid compatibility, administration routes, and timing of the medications to be used The patient's access for treatment and lifetime cumulative dose history, if applicable  The patient's medication allergies and previous infusion related reactions, if applicable   Changes made to treatment plan:  N/A  Follow up needed:  N/A   Dionicia Canavan, PharmD, RPh PGY1 Acute Care Pharmacy Resident [facility]  08/26/2024 2:15 PM

## 2024-08-27 ENCOUNTER — Other Ambulatory Visit: Payer: Self-pay

## 2024-08-27 ENCOUNTER — Telehealth: Payer: Self-pay

## 2024-08-27 ENCOUNTER — Telehealth: Payer: Self-pay | Admitting: Internal Medicine

## 2024-08-27 NOTE — Telephone Encounter (Signed)
 Left the patient a voicemail with the scheduled appointment details for 10/10 and informed them the education was a 2 hour class and he could have two individuals with him as long as they are of the age of 39.

## 2024-08-27 NOTE — Telephone Encounter (Signed)
 Received voicemail from patient's daughter inquiring if [name] is aware of the patient's upcoming appointments.  Made call to [name] and left voicemail requesting a return call to confirm whether they are aware of the patient's appointments on Friday and beginning Monday of next week.

## 2024-08-28 ENCOUNTER — Encounter: Payer: Self-pay | Admitting: Internal Medicine

## 2024-08-28 NOTE — Telephone Encounter (Signed)
 Spoke with Rose at [facility] in regards to patients upcoming appts.  Informed Rose of his scheduled appts for October and November. She voiced understanding.   Also tried to reach the patients daughter in regards to appts.  LVM stating that Jorge Sherman is aware of his appts for October and November.  Asked for a return call with any questions.

## 2024-08-30 ENCOUNTER — Other Ambulatory Visit: Payer: Self-pay

## 2024-08-30 ENCOUNTER — Inpatient Hospital Stay

## 2024-08-30 DIAGNOSIS — D469 Myelodysplastic syndrome, unspecified: Secondary | ICD-10-CM

## 2024-08-30 DIAGNOSIS — Z5111 Encounter for antineoplastic chemotherapy: Secondary | ICD-10-CM | POA: Diagnosis not present

## 2024-08-30 LAB — CBC WITH DIFFERENTIAL (CANCER CENTER ONLY)
Abs Immature Granulocytes: 0.02 K/uL (ref 0.00–0.07)
Basophils Absolute: 0 K/uL (ref 0.0–0.1)
Basophils Relative: 0 %
Eosinophils Absolute: 0 K/uL (ref 0.0–0.5)
Eosinophils Relative: 1 %
HCT: 25.3 % — ABNORMAL LOW (ref 39.0–52.0)
Hemoglobin: 8.3 g/dL — ABNORMAL LOW (ref 13.0–17.0)
Immature Granulocytes: 1 %
Lymphocytes Relative: 28 %
Lymphs Abs: 0.6 K/uL — ABNORMAL LOW (ref 0.7–4.0)
MCH: 28.4 pg (ref 26.0–34.0)
MCHC: 32.8 g/dL (ref 30.0–36.0)
MCV: 86.6 fL (ref 80.0–100.0)
Monocytes Absolute: 0.1 K/uL (ref 0.1–1.0)
Monocytes Relative: 5 %
Neutro Abs: 1.3 K/uL — ABNORMAL LOW (ref 1.7–7.7)
Neutrophils Relative %: 65 %
Platelet Count: 123 K/uL — ABNORMAL LOW (ref 150–400)
RBC: 2.92 MIL/uL — ABNORMAL LOW (ref 4.22–5.81)
RDW: 15.1 % (ref 11.5–15.5)
WBC Count: 2.1 K/uL — ABNORMAL LOW (ref 4.0–10.5)
nRBC: 0 % (ref 0.0–0.2)

## 2024-08-30 LAB — BASIC METABOLIC PANEL - CANCER CENTER ONLY
Anion gap: 5 (ref 5–15)
BUN: 23 mg/dL (ref 8–23)
CO2: 30 mmol/L (ref 22–32)
Calcium: 9.6 mg/dL (ref 8.9–10.3)
Chloride: 99 mmol/L (ref 98–111)
Creatinine: 1.15 mg/dL (ref 0.61–1.24)
GFR, Estimated: >60 mL/min (ref >60)
Glucose, Bld: 126 mg/dL — ABNORMAL HIGH (ref 70–99)
Potassium: 4.4 mmol/L (ref 3.5–5.1)
Sodium: 134 mmol/L — ABNORMAL LOW (ref 135–145)

## 2024-09-02 ENCOUNTER — Inpatient Hospital Stay

## 2024-09-02 VITALS — BP 128/56 | HR 85 | Temp 98.5°F | Resp 20 | Wt 208.0 lb

## 2024-09-02 DIAGNOSIS — D469 Myelodysplastic syndrome, unspecified: Secondary | ICD-10-CM

## 2024-09-02 DIAGNOSIS — Z5111 Encounter for antineoplastic chemotherapy: Secondary | ICD-10-CM | POA: Diagnosis not present

## 2024-09-02 LAB — CBC WITH DIFFERENTIAL (CANCER CENTER ONLY)
Abs Immature Granulocytes: 0.01 K/uL (ref 0.00–0.07)
Basophils Absolute: 0 K/uL (ref 0.0–0.1)
Basophils Relative: 0 %
Eosinophils Absolute: 0 K/uL (ref 0.0–0.5)
Eosinophils Relative: 2 %
HCT: 23.7 % — ABNORMAL LOW (ref 39.0–52.0)
Hemoglobin: 7.7 g/dL — ABNORMAL LOW (ref 13.0–17.0)
Immature Granulocytes: 0 %
Lymphocytes Relative: 51 %
Lymphs Abs: 1.2 K/uL (ref 0.7–4.0)
MCH: 28.4 pg (ref 26.0–34.0)
MCHC: 32.5 g/dL (ref 30.0–36.0)
MCV: 87.5 fL (ref 80.0–100.0)
Monocytes Absolute: 0.1 K/uL (ref 0.1–1.0)
Monocytes Relative: 6 %
Neutro Abs: 1 K/uL — ABNORMAL LOW (ref 1.7–7.7)
Neutrophils Relative %: 41 %
Platelet Count: 127 K/uL — ABNORMAL LOW (ref 150–400)
RBC: 2.71 MIL/uL — ABNORMAL LOW (ref 4.22–5.81)
RDW: 15.5 % (ref 11.5–15.5)
WBC Count: 2.4 K/uL — ABNORMAL LOW (ref 4.0–10.5)
nRBC: 0 % (ref 0.0–0.2)

## 2024-09-02 LAB — SAMPLE TO BLOOD BANK

## 2024-09-02 MED ORDER — AZACITIDINE CHEMO SQ INJECTION
75.0000 mg/m2 | Freq: Once | INTRAMUSCULAR | Status: AC
  Administered 2024-09-02: 162.5 mg via SUBCUTANEOUS
  Filled 2024-09-02: qty 6.5

## 2024-09-02 MED ORDER — ONDANSETRON HCL 8 MG PO TABS
8.0000 mg | ORAL_TABLET | Freq: Once | ORAL | Status: AC
  Administered 2024-09-02: 8 mg via ORAL
  Filled 2024-09-02: qty 1

## 2024-09-02 NOTE — Patient Instructions (Signed)
 [facility] - A DEPT OF [place]  Discharge Instructions: Thank you for choosing [facility] to provide your oncology and hematology care.   If you have a lab appointment with the Cancer Center, please go directly to the Cancer Center and check in at the registration area.   Wear comfortable clothing and clothing appropriate for easy access to any Portacath or PICC line.   We strive to give you quality time with your provider. You may need to reschedule your appointment if you arrive late (15 or more minutes).  Arriving late affects you and other patients whose appointments are after yours.  Also, if you miss three or more appointments without notifying the office, you may be dismissed from the clinic at the provider's discretion.      For prescription refill requests, have your pharmacy contact our office and allow 72 hours for refills to be completed.    Today you received the following chemotherapy and/or immunotherapy agents: Azacitidine .       To help prevent nausea and vomiting after your treatment, we encourage you to take your nausea medication as directed.  BELOW ARE SYMPTOMS THAT SHOULD BE REPORTED IMMEDIATELY: *FEVER GREATER THAN 100.4 F (38 C) OR HIGHER *CHILLS OR SWEATING *NAUSEA AND VOMITING THAT IS NOT CONTROLLED WITH YOUR NAUSEA MEDICATION *UNUSUAL SHORTNESS OF BREATH *UNUSUAL BRUISING OR BLEEDING *URINARY PROBLEMS (pain or burning when urinating, or frequent urination) *BOWEL PROBLEMS (unusual diarrhea, constipation, pain near the anus) TENDERNESS IN MOUTH AND THROAT WITH OR WITHOUT PRESENCE OF ULCERS (sore throat, sores in mouth, or a toothache) UNUSUAL RASH, SWELLING OR PAIN  UNUSUAL VAGINAL DISCHARGE OR ITCHING   Items with * indicate a potential emergency and should be followed up as soon as possible or go to the Emergency Department if any problems should occur.  Please show the CHEMOTHERAPY ALERT CARD or  IMMUNOTHERAPY ALERT CARD at check-in to the Emergency Department and triage nurse.  Should you have questions after your visit or need to cancel or reschedule your appointment, please contact [name]  Dept: [phone]  and follow the prompts.  Office hours are 8:00 a.m. to 4:30 p.m. Monday - Friday. Please note that voicemails left after 4:00 p.m. may not be returned until the following business day.  We are closed weekends and major holidays. You have access to a nurse at all times for urgent questions. Please call the main number to the clinic Dept: [phone] and follow the prompts.   For any non-urgent questions, you may also contact your provider using MyChart. We now offer e-Visits for anyone 42 and older to request care online for non-urgent symptoms. For details visit [email or web address].   Also download the MyChart app! Go to the app store, search MyChart, open the app, select [place], and log in with your MyChart username and password.

## 2024-09-03 ENCOUNTER — Inpatient Hospital Stay

## 2024-09-03 VITALS — BP 98/59 | HR 94 | Temp 97.8°F | Resp 18

## 2024-09-03 DIAGNOSIS — D469 Myelodysplastic syndrome, unspecified: Secondary | ICD-10-CM

## 2024-09-03 DIAGNOSIS — Z5111 Encounter for antineoplastic chemotherapy: Secondary | ICD-10-CM | POA: Diagnosis not present

## 2024-09-03 MED ORDER — AZACITIDINE CHEMO SQ INJECTION
75.0000 mg/m2 | Freq: Once | INTRAMUSCULAR | Status: AC
  Administered 2024-09-03: 162.5 mg via SUBCUTANEOUS
  Filled 2024-09-03: qty 6.5

## 2024-09-03 MED ORDER — ONDANSETRON HCL 8 MG PO TABS
8.0000 mg | ORAL_TABLET | Freq: Once | ORAL | Status: AC
  Administered 2024-09-03: 8 mg via ORAL
  Filled 2024-09-03: qty 1

## 2024-09-03 NOTE — Patient Instructions (Signed)
 [facility] - A DEPT OF [place]  Discharge Instructions: Thank you for choosing [facility] to provide your oncology and hematology care.   If you have a lab appointment with the Cancer Center, please go directly to the Cancer Center and check in at the registration area.   Wear comfortable clothing and clothing appropriate for easy access to any Portacath or PICC line.   We strive to give you quality time with your provider. You may need to reschedule your appointment if you arrive late (15 or more minutes).  Arriving late affects you and other patients whose appointments are after yours.  Also, if you miss three or more appointments without notifying the office, you may be dismissed from the clinic at the provider's discretion.      For prescription refill requests, have your pharmacy contact our office and allow 72 hours for refills to be completed.    Today you received the following chemotherapy and/or immunotherapy agents: Azacitidine .       To help prevent nausea and vomiting after your treatment, we encourage you to take your nausea medication as directed.  BELOW ARE SYMPTOMS THAT SHOULD BE REPORTED IMMEDIATELY: *FEVER GREATER THAN 100.4 F (38 C) OR HIGHER *CHILLS OR SWEATING *NAUSEA AND VOMITING THAT IS NOT CONTROLLED WITH YOUR NAUSEA MEDICATION *UNUSUAL SHORTNESS OF BREATH *UNUSUAL BRUISING OR BLEEDING *URINARY PROBLEMS (pain or burning when urinating, or frequent urination) *BOWEL PROBLEMS (unusual diarrhea, constipation, pain near the anus) TENDERNESS IN MOUTH AND THROAT WITH OR WITHOUT PRESENCE OF ULCERS (sore throat, sores in mouth, or a toothache) UNUSUAL RASH, SWELLING OR PAIN  UNUSUAL VAGINAL DISCHARGE OR ITCHING   Items with * indicate a potential emergency and should be followed up as soon as possible or go to the Emergency Department if any problems should occur.  Please show the CHEMOTHERAPY ALERT CARD or  IMMUNOTHERAPY ALERT CARD at check-in to the Emergency Department and triage nurse.  Should you have questions after your visit or need to cancel or reschedule your appointment, please contact [name]  Dept: [phone]  and follow the prompts.  Office hours are 8:00 a.m. to 4:30 p.m. Monday - Friday. Please note that voicemails left after 4:00 p.m. may not be returned until the following business day.  We are closed weekends and major holidays. You have access to a nurse at all times for urgent questions. Please call the main number to the clinic Dept: [phone] and follow the prompts.   For any non-urgent questions, you may also contact your provider using MyChart. We now offer e-Visits for anyone 42 and older to request care online for non-urgent symptoms. For details visit [email or web address].   Also download the MyChart app! Go to the app store, search MyChart, open the app, select [place], and log in with your MyChart username and password.

## 2024-09-04 ENCOUNTER — Inpatient Hospital Stay

## 2024-09-04 VITALS — BP 112/53 | HR 84 | Temp 98.5°F | Resp 20

## 2024-09-04 DIAGNOSIS — D469 Myelodysplastic syndrome, unspecified: Secondary | ICD-10-CM

## 2024-09-04 DIAGNOSIS — Z5111 Encounter for antineoplastic chemotherapy: Secondary | ICD-10-CM | POA: Diagnosis not present

## 2024-09-04 MED ORDER — ONDANSETRON HCL 8 MG PO TABS
8.0000 mg | ORAL_TABLET | Freq: Once | ORAL | Status: AC
  Administered 2024-09-04: 8 mg via ORAL
  Filled 2024-09-04: qty 1

## 2024-09-04 MED ORDER — AZACITIDINE CHEMO SQ INJECTION
75.0000 mg/m2 | Freq: Once | INTRAMUSCULAR | Status: AC
  Administered 2024-09-04: 162.5 mg via SUBCUTANEOUS
  Filled 2024-09-04: qty 6.5

## 2024-09-04 NOTE — Patient Instructions (Signed)
 [facility] - A DEPT OF [place]  Discharge Instructions: Thank you for choosing [facility] to provide your oncology and hematology care.   If you have a lab appointment with the Cancer Center, please go directly to the Cancer Center and check in at the registration area.   Wear comfortable clothing and clothing appropriate for easy access to any Portacath or PICC line.   We strive to give you quality time with your provider. You may need to reschedule your appointment if you arrive late (15 or more minutes).  Arriving late affects you and other patients whose appointments are after yours.  Also, if you miss three or more appointments without notifying the office, you may be dismissed from the clinic at the provider's discretion.      For prescription refill requests, have your pharmacy contact our office and allow 72 hours for refills to be completed.    Today you received the following chemotherapy and/or immunotherapy agents: Azacitidine .       To help prevent nausea and vomiting after your treatment, we encourage you to take your nausea medication as directed.  BELOW ARE SYMPTOMS THAT SHOULD BE REPORTED IMMEDIATELY: *FEVER GREATER THAN 100.4 F (38 C) OR HIGHER *CHILLS OR SWEATING *NAUSEA AND VOMITING THAT IS NOT CONTROLLED WITH YOUR NAUSEA MEDICATION *UNUSUAL SHORTNESS OF BREATH *UNUSUAL BRUISING OR BLEEDING *URINARY PROBLEMS (pain or burning when urinating, or frequent urination) *BOWEL PROBLEMS (unusual diarrhea, constipation, pain near the anus) TENDERNESS IN MOUTH AND THROAT WITH OR WITHOUT PRESENCE OF ULCERS (sore throat, sores in mouth, or a toothache) UNUSUAL RASH, SWELLING OR PAIN  UNUSUAL VAGINAL DISCHARGE OR ITCHING   Items with * indicate a potential emergency and should be followed up as soon as possible or go to the Emergency Department if any problems should occur.  Please show the CHEMOTHERAPY ALERT CARD or  IMMUNOTHERAPY ALERT CARD at check-in to the Emergency Department and triage nurse.  Should you have questions after your visit or need to cancel or reschedule your appointment, please contact [name]  Dept: [phone]  and follow the prompts.  Office hours are 8:00 a.m. to 4:30 p.m. Monday - Friday. Please note that voicemails left after 4:00 p.m. may not be returned until the following business day.  We are closed weekends and major holidays. You have access to a nurse at all times for urgent questions. Please call the main number to the clinic Dept: [phone] and follow the prompts.   For any non-urgent questions, you may also contact your provider using MyChart. We now offer e-Visits for anyone 42 and older to request care online for non-urgent symptoms. For details visit [email or web address].   Also download the MyChart app! Go to the app store, search MyChart, open the app, select [place], and log in with your MyChart username and password.

## 2024-09-05 ENCOUNTER — Inpatient Hospital Stay

## 2024-09-05 VITALS — BP 123/85 | HR 94 | Temp 97.6°F | Resp 20

## 2024-09-05 DIAGNOSIS — Z5111 Encounter for antineoplastic chemotherapy: Secondary | ICD-10-CM | POA: Diagnosis not present

## 2024-09-05 DIAGNOSIS — D469 Myelodysplastic syndrome, unspecified: Secondary | ICD-10-CM

## 2024-09-05 MED ORDER — ONDANSETRON HCL 8 MG PO TABS
8.0000 mg | ORAL_TABLET | Freq: Once | ORAL | Status: AC
  Administered 2024-09-05: 8 mg via ORAL
  Filled 2024-09-05: qty 1

## 2024-09-05 MED ORDER — AZACITIDINE CHEMO SQ INJECTION
75.0000 mg/m2 | Freq: Once | INTRAMUSCULAR | Status: AC
  Administered 2024-09-05: 162.5 mg via SUBCUTANEOUS
  Filled 2024-09-05: qty 6.5

## 2024-09-05 NOTE — Patient Instructions (Signed)
 [facility] - A DEPT OF [place]  Discharge Instructions: Thank you for choosing [facility] to provide your oncology and hematology care.   If you have a lab appointment with the Cancer Center, please go directly to the Cancer Center and check in at the registration area.   Wear comfortable clothing and clothing appropriate for easy access to any Portacath or PICC line.   We strive to give you quality time with your provider. You may need to reschedule your appointment if you arrive late (15 or more minutes).  Arriving late affects you and other patients whose appointments are after yours.  Also, if you miss three or more appointments without notifying the office, you may be dismissed from the clinic at the provider's discretion.      For prescription refill requests, have your pharmacy contact our office and allow 72 hours for refills to be completed.    Today you received the following chemotherapy and/or immunotherapy agents: Azacitidine .       To help prevent nausea and vomiting after your treatment, we encourage you to take your nausea medication as directed.  BELOW ARE SYMPTOMS THAT SHOULD BE REPORTED IMMEDIATELY: *FEVER GREATER THAN 100.4 F (38 C) OR HIGHER *CHILLS OR SWEATING *NAUSEA AND VOMITING THAT IS NOT CONTROLLED WITH YOUR NAUSEA MEDICATION *UNUSUAL SHORTNESS OF BREATH *UNUSUAL BRUISING OR BLEEDING *URINARY PROBLEMS (pain or burning when urinating, or frequent urination) *BOWEL PROBLEMS (unusual diarrhea, constipation, pain near the anus) TENDERNESS IN MOUTH AND THROAT WITH OR WITHOUT PRESENCE OF ULCERS (sore throat, sores in mouth, or a toothache) UNUSUAL RASH, SWELLING OR PAIN  UNUSUAL VAGINAL DISCHARGE OR ITCHING   Items with * indicate a potential emergency and should be followed up as soon as possible or go to the Emergency Department if any problems should occur.  Please show the CHEMOTHERAPY ALERT CARD or  IMMUNOTHERAPY ALERT CARD at check-in to the Emergency Department and triage nurse.  Should you have questions after your visit or need to cancel or reschedule your appointment, please contact [name]  Dept: [phone]  and follow the prompts.  Office hours are 8:00 a.m. to 4:30 p.m. Monday - Friday. Please note that voicemails left after 4:00 p.m. may not be returned until the following business day.  We are closed weekends and major holidays. You have access to a nurse at all times for urgent questions. Please call the main number to the clinic Dept: [phone] and follow the prompts.   For any non-urgent questions, you may also contact your provider using MyChart. We now offer e-Visits for anyone 42 and older to request care online for non-urgent symptoms. For details visit [email or web address].   Also download the MyChart app! Go to the app store, search MyChart, open the app, select [place], and log in with your MyChart username and password.

## 2024-09-06 ENCOUNTER — Telehealth: Payer: Self-pay

## 2024-09-06 ENCOUNTER — Other Ambulatory Visit: Payer: Self-pay | Admitting: Physician Assistant

## 2024-09-06 ENCOUNTER — Inpatient Hospital Stay: Admitting: Licensed Clinical Social Worker

## 2024-09-06 ENCOUNTER — Encounter: Payer: Self-pay | Admitting: Internal Medicine

## 2024-09-06 ENCOUNTER — Inpatient Hospital Stay

## 2024-09-06 VITALS — BP 126/81 | HR 100 | Temp 97.9°F | Resp 18

## 2024-09-06 DIAGNOSIS — D469 Myelodysplastic syndrome, unspecified: Secondary | ICD-10-CM

## 2024-09-06 DIAGNOSIS — Z5111 Encounter for antineoplastic chemotherapy: Secondary | ICD-10-CM | POA: Diagnosis not present

## 2024-09-06 MED ORDER — AZACITIDINE CHEMO SQ INJECTION
75.0000 mg/m2 | Freq: Once | INTRAMUSCULAR | Status: AC
  Administered 2024-09-06: 162.5 mg via SUBCUTANEOUS
  Filled 2024-09-06: qty 6.5

## 2024-09-06 MED ORDER — ONDANSETRON HCL 8 MG PO TABS
8.0000 mg | ORAL_TABLET | Freq: Once | ORAL | Status: AC
  Administered 2024-09-06: 8 mg via ORAL
  Filled 2024-09-06: qty 1
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
## 2024-09-06 NOTE — Patient Instructions (Signed)
 [facility] - A DEPT OF [place]  Discharge Instructions: Thank you for choosing [facility] to provide your oncology and hematology care.   If you have a lab appointment with the Cancer Center, please go directly to the Cancer Center and check in at the registration area.   Wear comfortable clothing and clothing appropriate for easy access to any Portacath or PICC line.   We strive to give you quality time with your provider. You may need to reschedule your appointment if you arrive late (15 or more minutes).  Arriving late affects you and other patients whose appointments are after yours.  Also, if you miss three or more appointments without notifying the office, you may be dismissed from the clinic at the provider's discretion.      For prescription refill requests, have your pharmacy contact our office and allow 72 hours for refills to be completed.    Today you received the following chemotherapy and/or immunotherapy agents vidaza       To help prevent nausea and vomiting after your treatment, we encourage you to take your nausea medication as directed.  BELOW ARE SYMPTOMS THAT SHOULD BE REPORTED IMMEDIATELY: *FEVER GREATER THAN 100.4 F (38 C) OR HIGHER *CHILLS OR SWEATING *NAUSEA AND VOMITING THAT IS NOT CONTROLLED WITH YOUR NAUSEA MEDICATION *UNUSUAL SHORTNESS OF BREATH *UNUSUAL BRUISING OR BLEEDING *URINARY PROBLEMS (pain or burning when urinating, or frequent urination) *BOWEL PROBLEMS (unusual diarrhea, constipation, pain near the anus) TENDERNESS IN MOUTH AND THROAT WITH OR WITHOUT PRESENCE OF ULCERS (sore throat, sores in mouth, or a toothache) UNUSUAL RASH, SWELLING OR PAIN  UNUSUAL VAGINAL DISCHARGE OR ITCHING   Items with * indicate a potential emergency and should be followed up as soon as possible or go to the Emergency Department if any problems should occur.  Please show the CHEMOTHERAPY ALERT CARD or IMMUNOTHERAPY  ALERT CARD at check-in to the Emergency Department and triage nurse.  Should you have questions after your visit or need to cancel or reschedule your appointment, please contact [name]  Dept: [phone]  and follow the prompts.  Office hours are 8:00 a.m. to 4:30 p.m. Monday - Friday. Please note that voicemails left after 4:00 p.m. may not be returned until the following business day.  We are closed weekends and major holidays. You have access to a nurse at all times for urgent questions. Please call the main number to the clinic Dept: [phone] and follow the prompts.   For any non-urgent questions, you may also contact your provider using MyChart. We now offer e-Visits for anyone 14 and older to request care online for non-urgent symptoms. For details visit [email or web address].   Also download the MyChart app! Go to the app store, search MyChart, open the app, select [place], and log in with your MyChart username and password.

## 2024-09-07 NOTE — Progress Notes (Deleted)
 [facility] OFFICE PROGRESS NOTE  [name] LABOR, MD [address] [place] [address]  DIAGNOSIS: Myelodysplastic syndrome with excess blast (MDS-ES-I diagnosed in September 2025. IPSS-M: High risk (5.79)  PRIOR THERAPY: None  CURRENT THERAPY: Vidaza days 1-7, first dose on  09/02/24  INTERVAL HISTORY: Jorge Sherman 83 y.o. male returns to the clinic today for a follow up visit. The patient was last seen in clinic on 08/22/2024.  The patient has MDS was diagnosed in September 2025.  He is here today to start his first treatment with 5 days ago.  He had his chemo education class and does not have any questions.  Today he denies any fever, chills, night sweats, unexplained weight loss.  He denies any signs and symptoms of infection including nasal congestion, sore throat, cough, shortness of breath, skin infections, diarrhea, nausea, vomiting or dysuria.  He denies any abnormal bleeding or bruising.  He denies any lymphadenopathy.  He is here today for evaluation and repeat blood work before undergoing day 8 cycle 1  MEDICAL HISTORY: Past Medical History:  Diagnosis Date   Chronic pain    Depressive disorder, not elsewhere classified    Dizziness and giddiness    Essential and other specified forms of tremor    Headache(784.0)    Leukocytosis, unspecified    Obstructive sleep apnea (adult) (pediatric)    Pain in joint, lower leg    Septic joint of left knee joint (HCC) 04/22/2019   Spinal stenosis, lumbar region, without neurogenic claudication    Thoracic or lumbosacral neuritis or radiculitis, unspecified    Type II or unspecified type diabetes mellitus without mention of complication, not stated as uncontrolled    Unspecified cardiovascular disease    Unspecified essential hypertension     ALLERGIES:  is allergic to cefepime, iodinated contrast media, iohexol , tositumomab, fentanyl , and iodine.  MEDICATIONS:  Current Outpatient Medications   Medication Sig Dispense Refill   acetaminophen  (TYLENOL ) 500 MG tablet Take 2 tablets (1,000 mg total) by mouth every 6 (six) hours as needed for headache, mild pain or fever. (Patient taking differently: Take 1,000 mg by mouth every 8 (eight) hours as needed for headache, mild pain (pain score 1-3) or fever.) 30 tablet 0   albuterol  (VENTOLIN  HFA) 108 (90 Base) MCG/ACT inhaler Inhale 2 puffs into the lungs every 4 (four) hours as needed for wheezing or shortness of breath. 1 each 0   ascorbic acid  (VITAMIN C) 500 MG tablet Take 500 mg by mouth every morning.     atorvastatin  (LIPITOR ) 80 MG tablet Take 80 mg by mouth at bedtime.     busPIRone  (BUSPAR ) 15 MG tablet Take 15 mg by mouth 3 (three) times daily.     Cranberry 450 MG TABS Take 450 mg by mouth in the morning.     Cyanocobalamin 5000 MCG TBDP Place 5,000 mcg under the tongue every morning.     cyclobenzaprine (FLEXERIL) 10 MG tablet Take 10 mg by mouth 3 (three) times daily as needed for muscle spasms.     Docusate Sodium  (DSS) 100 MG CAPS Take 100 mg by mouth in the morning and at bedtime.     DULoxetine  (CYMBALTA ) 60 MG capsule Take 60 mg by mouth daily.     ferrous sulfate  325 (65 FE) MG tablet Take 325 mg by mouth daily.      fluticasone (FLONASE) 50 MCG/ACT nasal spray Place 1 spray into the nose daily.     folic acid  (  FOLVITE ) 400 MCG tablet Take 400 mcg by mouth every morning.     guaiFENesin (ROBITUSSIN) 100 MG/5ML liquid Take 10 mLs by mouth every 4 (four) hours as needed for cough.     [Paused] hydroxychloroquine  (PLAQUENIL ) 200 MG tablet Take 200 mg by mouth 2 (two) times daily.     Hypromellose (NATURAL BALANCE TEARS OP) Place 2 drops into both eyes in the morning and at bedtime.     lidocaine  4 % Place 1 patch onto the skin in the morning.     LINZESS  145 MCG CAPS capsule Take 145 mcg by mouth daily.     loratadine (CLARITIN) 10 MG tablet Take 10 mg by mouth in the morning.     LORazepam  (ATIVAN ) 0.5 MG tablet Take 0.5 mg  by mouth 2 (two) times daily.     melatonin 3 MG TABS tablet Take 3 mg by mouth at bedtime.     morphine  (MSIR) 15 MG tablet Take 15 mg by mouth every 8 (eight) hours as needed for severe pain (pain score 7-10).     Multiple Vitamin (MULTIVITAMIN WITH MINERALS) TABS tablet Take 1 tablet by mouth daily.     ondansetron  (ZOFRAN ) 4 MG tablet Take 1 tablet (4 mg total) by mouth every 6 (six) hours as needed for nausea. 20 tablet 0   ondansetron  (ZOFRAN ) 8 MG tablet Take 1 tablet (8 mg total) by mouth every 8 (eight) hours as needed for nausea or vomiting. 30 tablet 1   pantoprazole  (PROTONIX ) 40 MG tablet Take 1 tablet (40 mg total) by mouth daily.     polyethylene glycol (MIRALAX  / GLYCOLAX ) 17 g packet Take 17 g by mouth daily as needed for mild constipation. (Patient taking differently: Take 17 g by mouth daily.) 14 each 0   pregabalin  (LYRICA ) 75 MG capsule Take 75 mg by mouth 2 (two) times daily.     prochlorperazine  (COMPAZINE ) 10 MG tablet Take 1 tablet (10 mg total) by mouth every 6 (six) hours as needed for nausea or vomiting. 30 tablet 1   senna-docusate (SENOKOT-S) 8.6-50 MG tablet Take 2 tablets by mouth 2 (two) times daily.     SUMAtriptan  (IMITREX ) 50 MG tablet Take 50 mg by mouth every 2 (two) hours as needed for migraine or headache.     tamsulosin  (FLOMAX ) 0.4 MG CAPS capsule Take 0.4 mg by mouth every morning.     thiamine  100 MG tablet Take 1 tablet (100 mg total) by mouth daily. 30 tablet 0   traZODone  (DESYREL ) 50 MG tablet Take 50 mg by mouth at bedtime.     No current facility-administered medications for this visit.    SURGICAL HISTORY:  Past Surgical History:  Procedure Laterality Date   BACK SURGERY     back surgey     times 3   GIVENS CAPSULE STUDY N/A 08/09/2024   Procedure: IMAGING PROCEDURE, GI TRACT, INTRALUMINAL, VIA CAPSULE;  Surgeon: Rosalie Kitchens, MD;  Location: [facility] ENDOSCOPY;  Service: Gastroenterology;  Laterality: N/A;   IR BONE MARROW BIOPSY & ASPIRATION   08/14/2024   IR INJECT/THERA/INC NEEDLE/CATH/PLC EPI/LUMB/SAC W/IMG  11/26/2020    REVIEW OF SYSTEMS:   Review of Systems  Constitutional: Negative for appetite change, chills, fatigue, fever and unexpected weight change.  HENT:   Negative for mouth sores, nosebleeds, sore throat and trouble swallowing.   Eyes: Negative for eye problems and icterus.  Respiratory: Negative for cough, hemoptysis, shortness of breath and wheezing.   Cardiovascular: Negative for chest pain  and leg swelling.  Gastrointestinal: Negative for abdominal pain, constipation, diarrhea, nausea and vomiting.  Genitourinary: Negative for bladder incontinence, difficulty urinating, dysuria, frequency and hematuria.   Musculoskeletal: Negative for back pain, gait problem, neck pain and neck stiffness.  Skin: Negative for itching and rash.  Neurological: Negative for dizziness, extremity weakness, gait problem, headaches, light-headedness and seizures.  Hematological: Negative for adenopathy. Does not bruise/bleed easily.  Psychiatric/Behavioral: Negative for confusion, depression and sleep disturbance. The patient is not nervous/anxious.     PHYSICAL EXAMINATION:  There were no vitals taken for this visit.  ECOG PERFORMANCE STATUS: {CHL ONC ECOG [id number]  Physical Exam  Constitutional: Oriented to person, place, and time and well-developed, well-nourished, and in no distress. No distress.  HENT:  Head: Normocephalic and atraumatic.  Mouth/Throat: Oropharynx is clear and moist. No oropharyngeal exudate.  Eyes: Conjunctivae are normal. Right eye exhibits no discharge. Left eye exhibits no discharge. No scleral icterus.  Neck: Normal range of motion. Neck supple.  Cardiovascular: Normal rate, regular rhythm, normal heart sounds and intact distal pulses.   Pulmonary/Chest: Effort normal and breath sounds normal. No respiratory distress. No wheezes. No rales.  Abdominal: Soft. Bowel sounds are normal. Exhibits no  distension and no mass. There is no tenderness.  Musculoskeletal: Normal range of motion. Exhibits no edema.  Lymphadenopathy:    No cervical adenopathy.  Neurological: Alert and oriented to person, place, and time. Exhibits normal muscle tone. Gait normal. Coordination normal.  Skin: Skin is warm and dry. No rash noted. Not diaphoretic. No erythema. No pallor.  Psychiatric: Mood, memory and judgment normal.  Vitals reviewed.  LABORATORY DATA: Lab Results  Component Value Date   WBC 2.4 (L) 09/02/2024   HGB 7.7 (L) 09/02/2024   HCT 23.7 (L) 09/02/2024   MCV 87.5 09/02/2024   PLT 127 (L) 09/02/2024      Chemistry      Component Value Date/Time   NA 134 (L) 08/30/2024 1342   K 4.4 08/30/2024 1342   CL 99 08/30/2024 1342   CO2 30 08/30/2024 1342   BUN 23 08/30/2024 1342   CREATININE 1.15 08/30/2024 1342      Component Value Date/Time   CALCIUM  9.6 08/30/2024 1342   ALKPHOS 79 08/22/2024 1101   AST 13 (L) 08/22/2024 1101   ALT 9 08/22/2024 1101   BILITOT 0.5 08/22/2024 1101       RADIOGRAPHIC STUDIES:  IR BONE MARROW BIOPSY & ASPIRATION Result Date: 08/14/2024 INDICATION: 83 year old male with history of pancytopenia peer EXAM: FLUOROSCOPIC-GUIDED BONE MARROW BIOPSY AND ASPIRATION MEDICATIONS: None ANESTHESIA/SEDATION: Fentanyl  75 mcg IV; Versed  2 mg IV Sedation Time: 4 minutes; The patient was continuously monitored during the procedure by the interventional radiology nurse under my direct supervision. FLUOROSCOPY: 3 mGy reference ir, COMPLICATIONS: None immediate. PROCEDURE: Informed consent was obtained from the patient following an explanation of the procedure, risks, benefits and alternatives. The patient understands, agrees and consents for the procedure. All questions were addressed. A time out was performed prior to the initiation of the procedure. The patient was positioned prone and the left iliac marrow space was identified fluoroscopically. The operative site was  prepped and draped in the usual sterile fashion. Under sterile conditions and local anesthesia, a 22 gauge spinal needle was utilized for procedural planning. Next, an 11 gauge coaxial bone biopsy needle was advanced into the left iliac marrow space. Initially, a bone marrow aspiration was performed. Next, a bone marrow biopsy was obtained with the 11 gauge outer bone  marrow device. The needle was removed and superficial hemostasis was obtained with manual compression. A dressing was applied. The patient tolerated the procedure well without immediate post procedural complication. IMPRESSION: Successful fluoroscopic guided left iliac bone marrow aspiration and core biopsy. Ester Sides, MD [facility] Electronically Signed   By: Ester Sides M.D.   On: 08/14/2024 12:07     ASSESSMENT/PLAN:  This is a very pleasant 83 year old Caucasian male diagnosed with myelodysplastic syndrome with excessive blasts and high risk IP SS-M he was diagnosed in September 2025.  He is currently undergoing treatment with Vidaza days 1 through 7 every 4 weeks.  His first dose was on 09/02/2024.  He tolerated this ***  Labs were reviewed.  Recommend that he ***  I will arrange for a blood transfusion.   We will see him back in 3 weeks before undergoing cycle #2  No orders of the defined types were placed in this encounter.    I spent {CHL ONC TIME VISIT - DTPQU:[id number]} counseling the patient face to face. The total time spent in the appointment was {CHL ONC TIME VISIT - DTPQU:[id number]}.  Kymani Laursen L Alyan Hartline, PA-C 09/07/24

## 2024-09-07 NOTE — H&P (Signed)
 Hospitalist Admission History and Physical    Chief Complaint  Shoulder Pain    HPI  83 y.o. male  has a past medical history of Anemia, Arthritis, BPH (benign prostatic hyperplasia), Coronary artery disease, Depression, Diabetes mellitus    (CMD), Diabetes mellitus type II, controlled    (CMD), Difficulty swallowing, GERD (gastroesophageal reflux disease), High cholesterol, Hypertension, Kidney stone, MRSA (methicillin resistant staph aureus) culture positive (08/10/2018), and OSA (obstructive sleep apnea).   83 year old male with a past medical history of MDS on azacitidine, DM type II, HTN, OSA, chronic leukopenia, chronic anemia who was referred for difficulty urination, weakness and potential confusion for the past 1 day. Patient is a poor historian he keeps talking all the time but does not come to the point.  As per him he was weak and he has difficulty urination.  He denied any fever, abdominal pain, nausea, vomiting, chest pain, shortness of breath, dizziness, loss of consciousness, headache. While in the ER hemoglobin was found to be 6.4 and UA was positive for UTI.  Admission given to the medical team for blood transfusion and management of UTI. Speaking with the daughter she wants her father to get discharged on Monday as she has upcoming chemotherapy.  A 14-point ROS was performed with pertinent positives/negatives noted in the HPI above. The remainder of the ROS are negative.   Assessment & Plan UTI (urinary tract infection) UA positive for UTI IV ceftriaxone  Pending urine culture Anemia Baseline Hgb 7-8 Will transfuse 1 unit of PRBC Monitor CBC and blood transfusion as needed to maintain hemoglobin above 7  Chronic medical conditions has been addressed and medications resumed based on benefit and risk DM2 HTN OSA Chronic leukopenia Psych disorder  Patient daughter at bedside. She was updated regarding current clinical condition and proposed plan for further  management. She understood and agreed with the outlined plan of care and all the questions were answered based on my ability, and no further concerns were raised at this time.   Patient has his upcoming azacitidine dose coming Monday  Code Status: DNR Full Scope of Treatment. This was discussed on the day of admission.  DVT Prophylaxis:Subcutaneous Lovenox   Anticipated disposition is to SNF in 1-2 days.   History  Medical History[1] Surgical History[2] Family History[3] Social History   Socioeconomic History  . Marital status: Divorced    Spouse name: Not on file  . Number of children: Not on file  . Years of education: Not on file  . Highest education level: Not on file  Occupational History  . Not on file  Tobacco Use  . Smoking status: Former    Current packs/day: 0.00    Types: Cigarettes    Quit date: 08/10/1988    Years since quitting: 36.1  . Smokeless tobacco: Never  Substance and Sexual Activity  . Alcohol  use: No  . Drug use: No    Comment: Drug use: Denies  . Sexual activity: Not on file    Comment: Seperated   Other Topics Concern  . Not on file  Social History Narrative   ** Merged History Encounter **       Social Drivers of Health   Food Insecurity: No Food Insecurity (08/09/2024)   Received from [facility]   Food vital sign   . Within the past 12 months, you worried that your food would run out before you got money to buy more: Never true   . Within the past 12 months, the food you bought  just didn't last and you didn't have money to get more: Never true  Transportation Needs: No Transportation Needs (08/09/2024)   Received from [facility] - Transportation   . In the past 12 months, has lack of transportation kept you from medical appointments or from getting medications?: No   . In the past 12 months, has lack of transportation kept you from meetings, work, or from getting things needed for daily living?: No  Safety: Not At Risk  (08/09/2024)   Received from [facility]   Safety   . Within the last year, have you been afraid of your partner or ex-partner?: No   . Within the last year, have you been humiliated or emotionally abused in other ways by your partner or ex-partner?: No   . Within the last year, have you been kicked, hit, slapped, or otherwise physically hurt by your partner or ex-partner?: No   . Within the last year, have you been raped or forced to have any kind of sexual activity by your partner or ex-partner?: No  Living Situation: Low Risk  (08/08/2024)   Received from [facility] Situation   . In the last 12 months, was there a time when you were not able to pay the mortgage or rent on time?: No   . In the past 12 months, how many times have you moved where you were living?: 0   . At any time in the past 12 months, were you homeless or living in a shelter (including now)?: No     Allergies  Allergies[4]   Home Medications  Medications Ordered Prior to Encounter[5]   Physical Exam  Temp:  [98.1 F (36.7 C)-99.2 F (37.3 C)] 98.9 F (37.2 C) Heart Rate:  [87-103] 87 Resp:  [16-31] 17 BP: (101-131)/(55-94) 115/61 Body mass index is 30.68 kg/m.  General: NAD  Eyes: External: conjunctivae and lids normal Pupils: equal, round, reactive to light  HENT: Ears & Nose: normal, no lesions or deformities  Respiratory: Effort: no intercostal retractions or use of accessory muscles Auscultation: CTA, no rales, rhonchi, or wheezes  Cardiovascular: Auscultation: RRR, S1, S2, no murmur, rub, or gallop Peripheral circulation: no cyanosis, edema, or varicosities  Gastrointestinal: Abdomen soft, nontender Liver and spleen: no enlargement or nodularity  Skin: Dry, intact  Neurologic: Awake and alert, no focal deficit     Labs and Results  I have reviewed today's pertinent labs Recent Results (from the past 24 hours)  Comprehensive Metabolic Panel   Collection Time: 09/07/24  3:47 PM  Result  Value Ref Range   Sodium 136 136 - 145 mmol/L   Potassium 4.1 3.4 - 4.5 mmol/L   Chloride 102 98 - 107 mmol/L   CO2 31 21 - 31 mmol/L   Anion Gap 3 (L) 6 - 14 mmol/L   Glucose, Random 112 (H) 70 - 99 mg/dL   Blood Urea  Nitrogen (BUN) 26 (H) 7 - 25 mg/dL   Creatinine 8.84 9.29 - 1.30 mg/dL   eGFR 63 >40 fO/fpw/8.26f7   Albumin 3.3 (L) 3.5 - 5.7 g/dL   Total Protein 6.1 (L) 6.4 - 8.9 g/dL   Bilirubin, Total 0.4 0.3 - 1.0 mg/dL   Alkaline Phosphatase (ALP) 61 34 - 104 U/L   Aspartate Aminotransferase (AST) 11 (L) 13 - 39 U/L   Alanine Aminotransferase (ALT) 8 7 - 52 U/L   Calcium  8.7 8.6 - 10.3 mg/dL   BUN/Creatinine Ratio     Corrected  Calcium  9.3 mg/dL  CBC with Differential   Collection Time: 09/07/24  3:47 PM  Result Value Ref Range   WBC 2.00 (L) 4.40 - 11.00 10*3/uL   RBC 2.23 (L) 4.50 - 5.90 10*6/uL   Hemoglobin 6.4 (LL) 14.0 - 17.5 g/dL   Hematocrit 80.8 (L) 58.4 - 50.4 %   Mean Corpuscular Volume (MCV) 85.8 80.0 - 96.0 fL   Mean Corpuscular Hemoglobin (MCH) 28.7 27.5 - 33.2 pg   Mean Corpuscular Hemoglobin Conc (MCHC) 33.4 33.0 - 37.0 g/dL   Red Cell Distribution Width (RDW) 16.7 12.3 - 17.0 %   Platelet Count (PLT) 120 (L) 150 - 450 10*3/uL   Mean Platelet Volume (MPV) 7.9 6.8 - 10.2 fL   Neutrophils % 65 %   Lymphocytes % 30 %   Monocytes % 4 %   Eosinophils % 1 %   Basophils % 0 %   Neutrophils Absolute 1.30 (L) 1.80 - 7.80 10*3/uL   Lymphocytes # 0.60 (L) 1.00 - 4.80 10*3/uL   Monocytes # 0.10 0.00 - 0.80 10*3/uL   Eosinophils # 0.00 0.00 - 0.50 10*3/uL   Basophils # 0.00 0.00 - 0.20 10*3/uL  Hemoglobin A1C With Estimated Average Glucose   Collection Time: 09/07/24  3:47 PM  Result Value Ref Range   Hemoglobin A1c 6.0 (H) <5.7 %   Estimated Average Glucose 126 mg/dL  Urinalysis with Reflex to Microscopic   Collection Time: 09/07/24  4:55 PM  Result Value Ref Range   Color, Urine Yellow Yellow   Clarity, Urine Cloudy (A) Clear   Specific Gravity, Urine  1.015 1.005 - 1.025   pH, Urine 8.0 5.0 - 8.0   Protein, Urine 70 (A) Negative, 10 , 20  mg/dL   Glucose, Urine Negative Negative, 30 , 50  mg/dL   Ketones, Urine Negative Negative, Trace mg/dL   Bilirubin, Urine Negative Negative   Blood, Urine Negative Negative, Trace   Nitrite, Urine Negative Negative   Leukocyte Esterase, Urine 500 (A) Negative, 25   Urobilinogen, Urine Normal <2.0 mg/dL   WBC, Urine >49 (A) <6 /HPF   RBC, Urine 3-5 (A) 0 - 2 /HPF   Bacteria, Urine Occasional None Seen, Rare /HPF   Squamous Epithelial Cells, Urine 0-5 0 - 5 /HPF   Hyaline Casts, Urine 0-2 0 - 2 /LPF   Triple Phosphate Crystals, Urine Present (A) None Seen /HPF  Type and screen   Collection Time: 09/07/24  5:21 PM  Result Value Ref Range   Component Type RED CELLS    Crossmatch Expiration 09-10-2024,2359    Type & Rh (ABORH) A Positive    Antibody Screen NEG   Prepare RBC (Sun[employer] Info)   Collection Time: 09/07/24  5:21 PM  Result Value Ref Range   Unit Number [id number]    Component Type LEUKORED. PC    Blood Unit Division 00    Dispense Status ISSUED    Unit Transfusion Status OK TO TRANSFUSE    Crossmatch Electronically Compatible    Blood Issue/Date Time [id number]    Blood Product Code E0336V00    Unit ABO/Rh A POS    Blood Type 6200    Product Expiration Date [id number]   POC Glucose   Collection Time: 09/07/24  6:56 PM  Result Value Ref Range   Glucose, POC 101 (H) 70 - 99 mg/dL    Radiology: CT Head WO Contrast Result Date: 09/07/2024 CT HEAD WITHOUT CONTRAST, 09/07/2024 6:30 PM INDICATION: ams COMPARISON: CT head without contrast  07/26/2024 and 01/22/2022 TECHNIQUE: Axial CT images of the brain from skull base to vertex, including portions of the face and sinuses, were obtained without contrast. Supplemental 2D reformatted images were generated and reviewed as needed. All CT scans at [facility] and [facility] are performed using  radiation dose optimization techniques as appropriate to a performed exam, including but not limited to one or more of the following: automatic exposure control, adjustment of the mA and/or kV according to patient size, use of iterative reconstruction technique. In addition, our institution participates in a radiation dose monitoring program to optimize patient radiation exposure. FINDINGS: Calvarium/skull base: No evidence of acute fracture or destructive lesion. Mastoids and middle ears demonstrate no substantial mucosal disease. Bilateral pseudophakia. Paranasal sinuses: No air fluid levels. Mild mucosal thickening of the ethmoid sinuses. Brain: No acute large vascular territory infarct. No mass effect. No hydrocephalus. No acute hemorrhage. Patchy areas of low density in the periventricular and subcortical white matter likely represent changes of chronic small vessel disease. Diffuse cerebral volume loss with ex vacuo expansion of the ventricles. Intracranial calcified atherosclerosis. Acute callosal angle with dilation of the bilateral sylvian fissures and crowding of the sulci along the vertex.   1.  No acute intracranial abnormality. However, CT is relatively insensitive for the detection of acute infarct within the first 24-48 hours, and MRI may be indicated if there is high clinical suspicion. 2.  Constellation of findings that can be seen in the setting of normal pressure hydrocephalus.          [1] Past Medical History: Diagnosis Date  . Anemia   . Arthritis   . BPH (benign prostatic hyperplasia)   . Coronary artery disease   . Depression   . Diabetes mellitus    (CMD)   . Diabetes mellitus type II, controlled    (CMD)    patient states per Dr. Carlo Hora that he is not diabetic  . Difficulty swallowing   . GERD (gastroesophageal reflux disease)   . High cholesterol   . Hypertension   . Kidney stone    history  . MRSA (methicillin resistant staph aureus) culture positive  08/10/2018   nasal swab  . OSA (obstructive sleep apnea)    does not use CPAP  [2] Past Surgical History: Procedure Laterality Date  . BACK SURGERY     Procedure: BACK SURGERY; 3x's, pt unsure of type   . BACK SURGERY     Procedure: BACK SURGERY  . CARDIAC CATHETERIZATION     Procedure: CARDIAC CATHETERIZATION  . OTHER SURGICAL HISTORY     Procedure: OTHER SURGICAL HISTORY (pain stimulator ); Implanted 2009  . POSTERIOR LAMINECTOMY / DECOMPRESSION LUMBAR SPINE N/A 08/14/2018   Procedure: L2-S1 Revision Laminectomy And Fusion, Removal of Hardware and Exploration of Fusion, Allograft;  Surgeon: Royden Elsie Schneider, MD;  Location: [facility] MAIN OR;  Service: Orthopedics;  Laterality: N/A;  Prone, Stim Neuromonitoring, [employer]Arm[employer], Cell Saver, Pro[employer], [employer], Justin, PACS on [employer]  [3] Family History Problem Relation Name Age of Onset  . Cancer Father    [4] Allergies Allergen Reactions  . Cefepime Rash and Other (See Comments)  . Iodine Hives  . Iohexol  Hives     Desc: A FEW HIVES POST CT, IODINE  . Tositumomab   . Fentanyl  GI Intolerance and Itching    Patient states that it happened over 30 yrs ago  . Iodine Rash  [5] No current facility-administered medications on file prior  to encounter.   Current Outpatient Medications on File Prior to Encounter  Medication Sig Dispense Refill  . acetaminophen  (TYLENOL ) 500 mg tablet Take 2 tablets (1,000 mg total) by mouth every 8 (eight) hours as needed for mild pain (1-3) or fever 100.4 F or GREATER.    . ascorbic acid  (VITAMIN C) 500 mg tablet Take 500 mg by mouth 3 (three) times a day.    . atorvastatin  (LIPITOR ) 80 mg tablet Take 1 tablet by mouth daily.    . busPIRone  (BUSPAR ) 15 mg tablet Take 15 mg by mouth 3 (three) times a day.    . cranberry fruit (cranberry) 450 mg tab tablet Take 450 mg by mouth daily.    . cyanocobalamin, vitamin B-12, 5,000 mcg TbDL Dissolve 1 tablet on tongue daily.    . cyclobenzaprine (FLEXERIL) 10 mg  tablet Take 10 mg by mouth 3 (three) times a day.    SABRA dextran 70-hypromellose, PF, 0.1-0.3 % ophthalmic solution Administer 2 drops into both eyes 2 (two) times a day.    . docusate sodium  (COLACE) 100 mg capsule Take 100 mg by mouth 2 (two) times a day.    . DULoxetine  (CYMBALTA ) 60 mg capsule Take 1 capsule (60 mg total) by mouth daily.    . ferrous sulfate  325 mg (65 mg iron) tablet Take 325 mg by mouth 3 (three) times a day with meals.    . fluticasone propionate (FLONASE) 50 mcg/spray nasal spray Administer 1 spray into each nostril daily.    . folic acid  (FOLVITE ) 400 mcg tablet Take 400 mcg by mouth daily.    . [Paused] hydroxychloroquine  (PLAQUENIL ) 200 mg tablet Take 200 mg by mouth 2 (two) times a day.    . lidocaine  (SALONPAS) 4 % patch Apply 1 patch topically daily.    . linaCLOtide  (Linzess ) 145 mcg cap capsule Take 145 mcg by mouth daily.    SABRA loratadine (CLARITIN) 10 mg tablet Take 10 mg by mouth daily.    . LORazepam  (ATIVAN ) 0.5 mg tablet Take 0.5 mg by mouth 2 (two) times a day.    . melatonin 3 mg tablet Take 1 tablet (3 mg total) by mouth Every evening.    . morphine  (MSIR) 15 mg tablet Take 15 mg by mouth every 8 (eight) hours as needed for severe pain (7-10).    . multivitamin with folic acid  400 mcg tab Take 1 tablet by mouth Once Daily.    . pantoprazole  (PROTONIX ) 40 mg EC tablet Take 40 mg by mouth 2 (two) times a day.    . polyethylene glycol (GLYCOLAX ) 17 gram packet Take 17 g by mouth daily.    . pregabalin  (LYRICA ) 50 mg capsule Take 1 capsule (50 mg total) by mouth 2 (two) times a day.    . sennosides-docusate sodium  (PERICOLACE) 8.6-50 mg per tablet Take 2 tablets by mouth 2 (two) times a day.    . tamsulosin  (FLOMAX ) 0.4 mg cap Take 0.4 mg by mouth daily.    . thiamine  (VITAMIN B1) 100 mg tablet Take 100 mg by mouth Once Daily.    . traZODone  (DESYREL ) 50 mg tablet Take 1 tablet (50 mg total) by mouth nightly.    . albuterol  HFA (PROVENTIL  HFA;VENTOLIN   HFA;PROAIR  HFA) 90 mcg/actuation inhaler Inhale 2 puffs every 6 (six) hours as needed for wheezing.    SABRA guaiFENesin (ROBITUSSIN) 100 mg/5 mL syrup Take 10 mL by mouth every 4 (four) hours as needed for cough.    . SUMAtriptan  (  IMITREX ) 50 mg tablet Take 1 tablet (50 mg total) by mouth every 2 (two) hours as needed for migraine. May repeat dose once in 2 hours if no relief.  Do not exceed 2 doses in 24 hours.    . [DISCONTINUED] buprenorphine  (Butrans ) 20 mcg/hour ptwk patch Place 1 patch on the skin every Friday.    . [DISCONTINUED] senna 8.6 mg tablet Take 2 tablets by mouth every 12 (twelve) hours as needed.

## 2024-09-08 NOTE — Care Plan (Signed)
 Problem: Health Behavior:  Goal: MCB Ability to state ways to decrease the risk of falls will be met by discharge  Description: Ability to state ways to decrease the risk of falls will improve by discharge  Outcome: Progressing     Problem: Safety:  Goal: Will remain free from falls by discharge  Description: Will remain free from falls by discharge  Outcome: Progressing     Problem: PAIN - ADULT  Goal: Verbalizes/displays adequate comfort level or baseline comfort level  Description: INTERVENTIONS:  1. Encourage pt to monitor pain and request assistance  2. Assess pain using appropriate pain scale  3. Administer analgesics based on type and severity of pain and evaluate response  4. Implement non-pharmacological measures as appropriate and evaluate response  5. Consider cultural and social influences on pain and pain management  6. Notify LIP if interventions unsuccessful or patient reports new pain  Outcome: Progressing     Problem: INFECTION - ADULT  Goal: Absence of infection during hospitalization  Description: INTERVENTIONS:  1. Assess and monitor for signs and symptoms of infection  2. Monitor lab/diagnostic results  3. Monitor all insertion sites i.e., indwelling lines, tubes and drains  4. Monitor endotracheal (as able) and nasal secretions for changes in amount and color  5. Institute appropriate cooling/warming therapies per order  6. Administer medications as ordered  7. Instruct and encourage patient and family to use good hand hygiene technique  8. Identify and instruct in appropriate isolation precautions for identified infection/condition  Outcome: Progressing  Goal: Absence of fever/infection during anticipated neutropenic period  Description: INTERVENTIONS  1. Monitor WBC  2. Administer growth factors as ordered  3. Implement neutropenic guidelines as ordered  Outcome: Progressing     Problem: Safety - Adult  Goal: Free from fall injury  Description: INTERVENTIONS:  1. Assess pt frequently for  physical needs  2. Identify cognitive and physical deficits and behaviors that affect risk of falls.  3. Institute fall precautions as indicated by assessment.  4. Educate pt/family on patient safety including physical limitations  5. Instruct pt to call for assistance with activity based on assessment  6. Modify environment to reduce risk of injury  7. Consider OT/PT consult to assist with strengthening/mobility  Outcome: Progressing  Goal: Absence of infection during hospitalization  Description: INTERVENTIONS:  1. Assess and monitor for signs and symptoms of infection  2. Monitor lab/diagnostic results  3. Monitor all insertion sites i.e., indwelling lines, tubes and drains  4. Monitor endotracheal (as able) and nasal secretions for changes in amount and color  5. Institute appropriate cooling/warming therapies per order  6. Administer medications as ordered  7. Instruct and encourage patient and family to use good hand hygiene technique  8. Identify and instruct in appropriate isolation precautions for identified infection/condition  Outcome: Progressing     Problem: DISCHARGE PLANNING  Goal: Discharge to home or other facility with appropriate resources  Description: INTERVENTIONS:  1. Identify barriers to discharge w/pt and caregiver  2. Arrange for needed discharge resources and transportation as appropriate  3. Identify discharge learning needs (meds, wound care, etc)  4. Arrange for interpreters to assist at discharge as needed  5. Refer to Case Management Department for coordinating discharge planning if the patient needs post-hospital services based on physician order or complex needs related to functional status, cognitive ability or social support system  Outcome: Progressing     Problem: Chronic Conditions and Co-Morbidities  Goal: Patient's chronic conditions and co-morbidity symptoms are  monitored and maintained or improved  Description: INTERVENTIONS:  1. Monitor and assess patient's chronic  conditions and comorbid symptoms for stability, deterioration, or improvement  2. Collaborate with multidisciplinary team to address chronic and comorbid conditions and prevent exacerbation or deterioration  3. Update acute care plan with appropriate goals if chronic or comorbid symptoms are exacerbated and prevent overall improvement and discharge  Outcome: Progressing

## 2024-09-08 NOTE — Progress Notes (Addendum)
 Case Management Adult Assessment  CSN: [id number] DOB: 05/19/1941 Service: General Medicine Location: 730/01   Patient with AMS, being treated for UTI and getting blood as found to have low hemoglobin CSW lvm with daughter Jorge Sherman Per chart review patient is from [facility] L[facility] and will return at dc CSW has reached out to Soy with Jorge Sherman to confirm patient can return at dc pending response  Update: Jorge Sherman called back reports plan for return Jorge Sherman  She has concerns with shot patient should be getting tomorrow and Tuesday, MD aware and reports will consult with pharmacy or patient can continue at dc.   Info & Contacts Assessment Completed: Phone conversation with (comment) (daughter Jorge Sherman - lvm, patient with AMS/UTI) The patient's status at this time is:: Able to communicate Prior to admission, patient resided at: Nursing home Facility Name: [facility] Prior to admission, patient lived with : Other (Comment) (facility resident) The patient's decision maker is:: Patient At discharge, will patient return to prior residence: Yes Barriers to education: No barriers   Extended Emergency Contact Information Primary Emergency Contact: Jorge Sherman, Jorge Sherman  [place] Home Phone: [phone] Relation: Daughter Secondary Emergency Contact: Jorge Sherman,Jorge Sherman  [place] Home Phone: [phone] Mobile Phone: [phone] Relation: Son  Assessment Is this patient at baseline?: No Was patient independent with ADLs prior to admission?: No Was patient independent with mobility prior to admission?: No Does this patient have or need any DME?: Undetermined, CM/SW will continue to assess Does this patient have or need any home health services?: Undetermined, CM/SW will continue to assess Does this patient have or need any personal care services?: No   Social Does patient have a mental health diagnosis?: No Does patient have an acute substance use issue?: No    Discharge How will patient obtain prescription meds at discharge:: Medicare Part D Type of Payer Source:: Medicare How will this patient obtain follow-up care after discharge?: Facility MD How will this patient reach the discharge destination?: Ambulance Is this a Chronic Dialysis patient?: No  Discharge Education Discharge Plan Discussed With:: Family Education Readiness:: Acceptance Education Method:: Explanation Education Response:: Verbalizes Understanding                        Case Management Coordination Status: Coordination In-Progress    Anticipated Discharge Location: Nursing Facility Long Term Care  If Plan A discharging location is not feasible: Potential Plan B: Nursing Facility Long Term Care      Post Acute Placement Status: Review currently in progress.  Jorge Sherman, MSW

## 2024-09-08 NOTE — Progress Notes (Signed)
 Case Management Screening  CSN: [id number] DOB: [birth year] Service: General Medicine Location: 730/01   Initial Screening Readmission Risk Score v2: 33.2 Risk Level: High - Patient meets high risk criteria for post hospital services. Assessment to be completed. > 20% Readmission Risk Score: Yes Patient has new or exacerbation of functional deficits:: Yes Patient from post-acute care facility: Yes  Patient is a 83 y/o male from [place] L[facility] presenting with UTI and low hemoglobin CSW to assess and follow for dc planning needs    Rosina CHRISTELLA Fitting, MSW

## 2024-09-09 ENCOUNTER — Encounter: Payer: Self-pay | Admitting: Internal Medicine

## 2024-09-09 ENCOUNTER — Inpatient Hospital Stay: Admitting: Physician Assistant

## 2024-09-09 ENCOUNTER — Telehealth: Payer: Self-pay

## 2024-09-09 ENCOUNTER — Telehealth: Payer: Self-pay | Admitting: *Deleted

## 2024-09-09 ENCOUNTER — Inpatient Hospital Stay

## 2024-09-09 ENCOUNTER — Ambulatory Visit

## 2024-09-09 ENCOUNTER — Other Ambulatory Visit

## 2024-09-09 NOTE — Progress Notes (Signed)
 [facility] Clinical Social Work  Initial Assessment   Jorge Sherman is a 83 y.o. year old male presenting alone. Clinical Social Work was referred by medical provider for assessment of psychosocial needs.   SDOH (Social Determinants of Health) assessments performed: Yes   SDOH Screenings   Food Insecurity: No Food Insecurity (08/09/2024)  Housing: Low Risk  (08/08/2024)  Transportation Needs: No Transportation Needs (08/09/2024)  Utilities: Not At Risk (08/09/2024)  Depression (PHQ2-9): Low Risk  (09/05/2024)  Financial Resource Strain: Low Risk  (11/01/2022)   Received from [facility]  Social Connections: Socially Isolated (08/09/2024)  Stress: No Stress Concern Present (11/01/2022)   Received from [facility]  Tobacco Use: Medium Risk (08/12/2024)    PHQ 2/9:    09/05/2024    3:00 PM 09/04/2024    2:30 PM 09/03/2024   10:00 AM  Depression screen PHQ 2/9  Decreased Interest 0 0 0  Down, Depressed, Hopeless 0 0 0  PHQ - 2 Score 0 0 0     Distress Screen completed: Yes    08/30/2024    4:36 PM  ONCBCN DISTRESS SCREENING  Screening Type Initial Screening  How much distress have you been experiencing in the past week? (0-10) 5  Practical concerns type Transportation  Social concerns type Relationship with children;Relationship with family members  Emotional concerns type Worry or anxiety;Loneliness  Physical Concerns Type  Pain;Sleep;Fatigue      Family/Social Information:  Housing Arrangement: Pt is presently in a skilled nursing facility [facility]), stating that he was living independently prior to hospitalization, but anticipates he will not be able to return to his own home independently moving forward. Family members/support persons in your life? Pt states he has a son and daughter locally who provide support as needed.  Transportation concerns: no  Employment: Retired [employer] (pt has extensively traveled).  Income source: Programmer, applications concerns: No Type of concern: None Food access concerns: no Religious or spiritual practice: Not known Advanced directives: No Services Currently in place:  pt is currently at [facility]  Coping/ Adjustment to diagnosis: Patient understands treatment plan and what happens next? yes Concerns about diagnosis and/or treatment: Quality of life Patient reported stressors: Adjusting to my illness Hopes and/or priorities: pt's priority is to regain some strength as well as some level of independence Patient enjoys time with family/ friends Current coping skills/ strengths: Motivation for treatment/growth  and Supportive family/friends     SUMMARY: Current SDOH Barriers:  No barriers identified at this time.  Clinical Social Work Clinical Goal(s):  No clinical social work goals at this time  Interventions: Discussed common feeling and emotions when being diagnosed with cancer, and the importance of support during treatment Informed patient of the support team roles and support services at [facility] Provided CSW contact information and encouraged patient to call with any questions or concerns Pt reports there was some anxiety when admitted to the SNF as he was previously independent, but states that has gotten better as he and his children have started making long term placement plans.  At this time pt declining additional supportive services.  Pt aware individual counseling is available.    Follow Up Plan: Patient will contact CSW with any support or resource needs Patient verbalizes understanding of plan: Yes    Jorge JONELLE Manna, LCSW Clinical Social Worker [facility]

## 2024-09-09 NOTE — Telephone Encounter (Signed)
 Tried to reach patients daughter to confirm if patient is in the hospital.  Asked for a return call to confirm.

## 2024-09-09 NOTE — Telephone Encounter (Signed)
 Spoke with daughter [name] and informed her last 2 treatments this week has been canceled due to hospitalization. He will resume treatment 09/30/24. To call with any other concerns or issues.

## 2024-09-10 ENCOUNTER — Inpatient Hospital Stay

## 2024-09-13 NOTE — Discharge Summary (Signed)
 ------------------------------------------------------------------------------- Attestation signed by Charlie Elgin Burman Mickey., DO at 09/13/2024  5:40 PM I did not personally see or evaluate this patient however I was immediately available to the APP for discussion, evaluation, or supervision of services.  Billing per APP.  -------------------------------------------------------------------------------    Hospital Medicine Discharge Summary   Demographics: Trevone Prestwood y.o. 05/10/1941 MRN: [id number]    Extended Emergency Contact Information Primary Emergency Contact: [facility] Home Phone: [phone] Relation: Daughter  DNAR per Portable DNAR  Admit Date: 09/07/2024                            Attending Physician: Charlie Elgin Burman Mickey., * Discharge Date: 09/13/2024  Primary Care Provider: PCP Needed PPI   None  Consults during this admission: Consult Orders             IP CONSULT TO HOSPITALIST       Provider:  (Not yet assigned)             Active & Resolved Diagnosis: Principal Problem (Resolved):   UTI (urinary tract infection) Active Problems:   Anemia   Impaired functional mobility, balance, gait, and endurance   MDS (myelodysplastic syndrome)    (CMD) Resolved Problems:   Urinary retention   Constipation  Disposition: Patient discharged to SNF / Short term Rehab in stable condition.  Discharge follow-up recommendations : Other: pt received 6+ days of abx for UTI. Had some urinary retention and constipation that has since resolved-- has had some spontaneous voiding and bowel movement this morning per EMR documentation. Follow up with PCP as OP  Scheduled Future Appointments       Provider Department Dept Phone Center   09/24/2024 10:00 AM Elspeth Banks [facility]  - Cardiology [place] [phone] [facility] [address] [facility]   10/22/2024 3:30 PM Lonni Deward Nak[facility]  - Gastroenterology  [place] [phone] [facility]   01/07/2025 3:30 PM Worth Gretel Blush [facility] - Otolaryngology [place] [phone] [facility] [place]   01/07/2025 4:15 PM Courteney McClutchy [facility] - [place] Outpatient Speech Language Pathology [phone] [facility] MP [facility] Course: 83 y.o. male  has a past medical history of Anemia, Arthritis, BPH (benign prostatic hyperplasia), Coronary artery disease, Depression, Diabetes mellitus    (CMD), Diabetes mellitus type II, controlled    (CMD), Difficulty swallowing, GERD (gastroesophageal reflux disease), High cholesterol, Hypertension, Kidney stone, MRSA (methicillin resistant staph aureus) culture positive (08/10/2018), and OSA (obstructive sleep apnea).    83 year old male with a past medical history of MDS on azacitidine, DM type II, HTN, OSA, chronic leukopenia, chronic anemia who was referred for difficulty urination, weakness and potential confusion for the past 1 day. Patient is a poor historian he keeps talking all the time but does not come to the point.  As per him he was weak and he has difficulty urination.  He denied any fever, abdominal pain, nausea, vomiting, chest pain, shortness of breath, dizziness, loss of consciousness, headache. While in the ER hemoglobin was found to be 6.4 and UA was positive for UTI.  Admission given to the medical team for blood transfusion and management of UTI. Speaking with the daughter she wants her father to get discharged on Monday as she has upcoming chemotherapy.   A 14-point ROS was performed with pertinent positives/negatives noted in the HPI above. The  remainder of the ROS are negative. Assessment & Plan MDS (myelodysplastic syndrome)    (CMD) This was discussed with his oncology center 10/19 and they advised that holding azacitidine due on 10/20 is acceptable.   UTI (urinary tract infection)--resolved UA with 500 leukocyte esterase and > 50 WBC and has  dysuria IV ceftriaxone  for 6+ days, now completed Urine culture with no growth-- no role for continued abx treatment at this point Impaired functional mobility, balance, gait, and endurance PT/OT recommending SNF Anemia Pancytopenia due to known MDS on azacitidine  Baseline Hgb 7-8 Got 1 unit of PRBC Monitor CBC and blood transfusion as needed to maintain hemoglobin above 7 Patient with recent endoscopic eval and is going to get capsule endoscopy outpatient Recently had iron/b12/folate/retics checked as well Can continue home supplementation, but would decrease iron given constipation. .  Chronic medical conditions   DM2 HTN OSA Chronic leukopenia Psych disorder          Wound / Incision Assessment: Refer to Chart Review and Media Tab for images if available.  Wound 06/08/24 Pretibial Right (Active)  Date First Assessed: 06/08/24   Location: Pretibial  Wound Location Orientation: Right     Wound 06/08/24 Sacrum (Active)  Date First Assessed: 06/08/24   Location: Sacrum     Wound 09/12/24 Skin Tear Arm Anterior;Left;Lower;Proximal (Active)  Date First Assessed/Time First Assessed: 09/12/24 2200   Primary Wound Type: Skin Tear  Location: Arm  Wound Location Orientation: Anterior;Left;Lower;Proximal    Vital Sign Range:  Temp:  [97.5 F (36.4 C)-98.1 F (36.7 C)] 97.7 F (36.5 C) Heart Rate:  [73-82] 81 Resp:  [18] 18 BP: (124-135)/(58-94) 129/58      Discharge Medications     PAUSE taking these medications      Sig Disp Refill Start End  hydroxychloroquine  200 mg tablet Wait to take this until your doctor or other care provider tells you to start again. HOLD UNTIL HEMATOLOGY FOLLOW UP  Commonly known as: PLAQUENIL   Take 200 mg by mouth 2 (two) times a day.   0         Medications To Continue      Sig Disp Refill Start End  acetaminophen  500 mg tablet Commonly known as: TYLENOL   Take 2 tablets (1,000 mg total) by mouth every 8 (eight) hours as  needed for mild pain (1-3) or fever 100.4 F or GREATER.   0     albuterol  HFA 90 mcg/actuation inhaler Commonly known as: PROVENTIL  HFA;VENTOLIN  HFA;PROAIR  HFA  Inhale 2 puffs every 6 (six) hours as needed for wheezing.   0     ascorbic acid  500 mg tablet Commonly known as: VITAMIN C  Take 500 mg by mouth 3 (three) times a day.   0     atorvastatin  80 mg tablet Commonly known as: LIPITOR   Take 1 tablet by mouth daily.   0     busPIRone  15 mg tablet Commonly known as: BUSPAR   Take 15 mg by mouth 3 (three) times a day.   0     cranberry 450 mg Tab tablet Generic drug: cranberry fruit  Take 450 mg by mouth daily.   0     cyanocobalamin (vitamin B-12) 5,000 mcg Tbdl  Dissolve 1 tablet on tongue daily.   0     cyclobenzaprine 10 mg tablet Commonly known as: FLEXERIL  Take 10 mg by mouth 3 (three) times a day.   0     dextran 70-hypromellose (PF) 0.1-0.3 % ophthalmic solution  Administer 2 drops into both eyes 2 (two) times a day.   0     docusate sodium  100 mg capsule Commonly known as: COLACE  Take 100 mg by mouth 2 (two) times a day.   0     DULoxetine  60 mg capsule Commonly known as: CYMBALTA   Take 1 capsule (60 mg total) by mouth daily.   0     ferrous sulfate  325 mg (65 mg iron) tablet  Take 325 mg by mouth 3 (three) times a day with meals.   0     fluticasone propionate 50 mcg/spray nasal spray Commonly known as: FLONASE  Administer 1 spray into each nostril daily.   0     folic acid  400 mcg tablet Commonly known as: FOLVITE   Take 400 mcg by mouth daily.   0     guaiFENesin 100 mg/5 mL syrup Commonly known as: ROBITUSSIN  Take 10 mL by mouth every 4 (four) hours as needed for cough.   0     lidocaine  4 % patch Commonly known as: SALONPAS  Apply 1 patch topically daily.   0     Linzess  145 mcg Cap capsule Generic drug: linaCLOtide   Take 145 mcg by mouth daily.   0     loratadine 10 mg tablet Commonly known as: CLARITIN  Take 10 mg by  mouth daily.   0     LORazepam  0.5 mg tablet Commonly known as: ATIVAN   Take 0.5 mg by mouth 2 (two) times a day.   0     melatonin 3 mg tablet  Take 1 tablet (3 mg total) by mouth Every evening.   0     morphine  15 mg tablet Commonly known as: MSIR  Take 15 mg by mouth every 8 (eight) hours as needed for severe pain (7-10).   0     multivitamin with folic acid  400 mcg Tab  Take 1 tablet by mouth Once Daily.   0     pantoprazole  40 mg EC tablet Commonly known as: PROTONIX   Take 40 mg by mouth 2 (two) times a day.   0     polyethylene glycol 17 gram packet Commonly known as: GLYCOLAX   Take 17 g by mouth daily.   0     pregabalin  50 mg capsule Commonly known as: LYRICA   Take 1 capsule (50 mg total) by mouth 2 (two) times a day.   0     sennosides-docusate sodium  8.6-50 mg per tablet Commonly known as: PERICOLACE  Take 2 tablets by mouth 2 (two) times a day.   0     SUMAtriptan  50 mg tablet Commonly known as: IMITREX   Take 1 tablet (50 mg total) by mouth every 2 (two) hours as needed for migraine. May repeat dose once in 2 hours if no relief.  Do not exceed 2 doses in 24 hours.   0     tamsulosin  0.4 mg Cap Commonly known as: FLOMAX   Take 0.4 mg by mouth daily.   0     thiamine  100 mg tablet Commonly known as: VITAMIN B1  Take 100 mg by mouth Once Daily.   0     traZODone  50 mg tablet Commonly known as: DESYREL   Take 1 tablet (50 mg total) by mouth nightly.   0         Discharge Orders     Ambulatory referral to PCP     DNAR per Portable DNAR     Lifting Limits:  Details:    Lifting Limits: No lifting limits       Physical Therapy Recommendations: Rehabilitation Facility     Lab Results  Component Value Date/Time   HGB 8.0 (L) 09/13/2024 06:32 AM   HCT 24.1 (L) 09/13/2024 06:32 AM   WBC 1.80 (L) 09/13/2024 06:32 AM   PLT 64 (L) 09/13/2024 06:32 AM   Lab Results  Component Value Date/Time   NA 136 09/12/2024 08:19 AM   K 4.5  09/12/2024 08:19 AM   CREATININE 0.84 09/12/2024 08:19 AM   BUN 17 09/12/2024 08:19 AM   GLUCOSE 91 09/12/2024 08:19 AM    Pertinent Imaging: CT Head WO Contrast  Final Result by Reyes Nelwyn Rainwater, MD (10/19 9049)  CT HEAD WITHOUT CONTRAST, 09/07/2024 6:30 PM    INDICATION: ams     COMPARISON: CT head without contrast 07/26/2024 and 01/22/2022    TECHNIQUE: Axial CT images of the brain from skull base to vertex,   including portions of the face and sinuses, were obtained without   contrast. Supplemental 2D reformatted images were generated and reviewed   as needed.    All CT scans at [facility] and [facility]   Imaging are performed using radiation dose optimization techniques as   appropriate to a performed exam, including but not limited to one or more   of the following: automatic exposure control, adjustment of the mA and/or   kV according to patient size, use of iterative reconstruction technique.   In addition, our institution participates in a radiation dose monitoring   program to optimize patient radiation exposure.    FINDINGS:  Calvarium/skull base: No evidence of acute fracture or destructive lesion.   Mastoids and middle ears demonstrate no substantial mucosal disease.   Bilateral pseudophakia.    Paranasal sinuses: No air fluid levels. Mild mucosal thickening of the   ethmoid sinuses.    Brain: No acute large vascular territory infarct. No mass effect. No   hydrocephalus. No acute hemorrhage. Patchy areas of low density in the   periventricular and subcortical white matter likely represent changes of   chronic small vessel disease. Diffuse cerebral volume loss with ex vacuo   expansion of the ventricles. Intracranial calcified atherosclerosis.      IMPRESSION:  No acute intracranial abnormality. However, CT is relatively insensitive   for the detection of acute infarct within the first 24-48 hours, and MRI   may be indicated  if there is high clinical suspicion.    Dr. Jennette was notified of the final conclusion via Epic secure chat at   950 AM on 09/08/24.       Location Information: Patient State (at time of visit): [place]  Patient Location (at time of visit):Medical Facility: [facility] Provider Location: Non-Provider-Based Clinic (Clinic, non-hospital) Is provider licensed to provide clinical care in the current location/state of the patient? Yes   Consent:  Patient's identity was confirmed. Presenting condition or illness was discussed with the patient/personal representative. Current proposed treatment for presenting condition or illness was explained to patient/personal representative along with the likely benefits and any significant risks or complications associated with the provision of treatment by audio/video means. The patient/personal representative verbally authorized treatment to be provided by audio/video, which may include a limited review of patient's current health status, medication, or other treatment recommendations, patient education, and an opportunity to ask questions about condition and treatment. Verbal Consent Granted by Patient/Personal Representative:Yes   Visit Information: Modality: 2-Way Real-Time  Audio/Video   Electronically signed by: Herlene Nian, NP 09/13/2024 8:22 AM   Time spent on discharge: 55 minutes

## 2024-09-17 ENCOUNTER — Other Ambulatory Visit: Payer: Self-pay

## 2024-09-19 ENCOUNTER — Emergency Department (HOSPITAL_COMMUNITY): Admission: EM | Admit: 2024-09-19 | Discharge: 2024-09-20 | Disposition: A | Discharge status: Home / Self Care

## 2024-09-19 ENCOUNTER — Other Ambulatory Visit: Payer: Self-pay

## 2024-09-19 ENCOUNTER — Encounter (HOSPITAL_COMMUNITY): Payer: Self-pay

## 2024-09-19 DIAGNOSIS — D696 Thrombocytopenia, unspecified: Secondary | ICD-10-CM | POA: Diagnosis not present

## 2024-09-19 DIAGNOSIS — D649 Anemia, unspecified: Secondary | ICD-10-CM | POA: Insufficient documentation

## 2024-09-19 LAB — I-STAT CHEM 8, ED
BUN: 19 mg/dL (ref 8–23)
Calcium, Ion: 1.12 mmol/L — ABNORMAL LOW (ref 1.15–1.40)
Chloride: 99 mmol/L (ref 98–111)
Creatinine, Ser: 1.1 mg/dL (ref 0.61–1.24)
Glucose, Bld: 111 mg/dL — ABNORMAL HIGH (ref 70–99)
HCT: 20 % — ABNORMAL LOW (ref 39.0–52.0)
Hemoglobin: 6.8 g/dL — CL (ref 13.0–17.0)
Potassium: 4.4 mmol/L (ref 3.5–5.1)
Sodium: 134 mmol/L — ABNORMAL LOW (ref 135–145)
TCO2: 24 mmol/L (ref 22–32)

## 2024-09-19 LAB — COMPREHENSIVE METABOLIC PANEL WITH GFR
ALT: 14 U/L (ref 0–44)
AST: 15 U/L (ref 15–41)
Albumin: 3 g/dL — ABNORMAL LOW (ref 3.5–5.0)
Alkaline Phosphatase: 55 U/L (ref 38–126)
Anion gap: 8 (ref 5–15)
BUN: 19 mg/dL (ref 8–23)
CO2: 25 mmol/L (ref 22–32)
Calcium: 8.2 mg/dL — ABNORMAL LOW (ref 8.9–10.3)
Chloride: 100 mmol/L (ref 98–111)
Creatinine, Ser: 1.02 mg/dL (ref 0.61–1.24)
GFR, Estimated: >60 mL/min (ref >60)
Glucose, Bld: 117 mg/dL — ABNORMAL HIGH (ref 70–99)
Potassium: 4.4 mmol/L (ref 3.5–5.1)
Sodium: 133 mmol/L — ABNORMAL LOW (ref 135–145)
Total Bilirubin: 0.5 mg/dL (ref 0.0–1.2)
Total Protein: 6 g/dL — ABNORMAL LOW (ref 6.5–8.1)

## 2024-09-19 LAB — CBC
HCT: 21 % — ABNORMAL LOW (ref 39.0–52.0)
Hemoglobin: 6.6 g/dL — CL (ref 13.0–17.0)
MCH: 28.9 pg (ref 26.0–34.0)
MCHC: 31.4 g/dL (ref 30.0–36.0)
MCV: 92.1 fL (ref 80.0–100.0)
Platelets: 90 K/uL — ABNORMAL LOW (ref 150–400)
RBC: 2.28 MIL/uL — ABNORMAL LOW (ref 4.22–5.81)
RDW: 16.1 % — ABNORMAL HIGH (ref 11.5–15.5)
WBC: 1.9 K/uL — ABNORMAL LOW (ref 4.0–10.5)
nRBC: 0 % (ref 0.0–0.2)

## 2024-09-19 LAB — PREPARE RBC (CROSSMATCH)

## 2024-09-19 MED ORDER — SODIUM CHLORIDE 0.9% IV SOLUTION: Freq: Once | INTRAVENOUS | Status: AC

## 2024-09-19 NOTE — ED Provider Notes (Signed)
 [place] EMERGENCY DEPARTMENT AT [facility] Provider Note   CSN: [id number] Arrival date & time: 09/19/24  2013     Patient presents with: Low Hemoglobin   Jorge Sherman is a 83 y.o. male.   HPI     Patient presents because of possible anemia.  Patient has a known history of MDS.  Patient states that he has had about 3 blood transfusions over the past couple of weeks because of this.  Patient states that he lives at the facility.  Took his laboratory workup today which showed him to be anemic with hemoglobin below 7.  Is not noticed any kind of new dark stools.  No bright red blood per rectum.  No nausea or vomiting.  Denies abdominal pain.  Does endorse some slight fatigue.  Patient states that this is pretty typical for his presentations for  Previous medical history reviewed : Patient last admitted to outside hospital and discharged on September 13, 2024.  Was admitted in setting of UTI and anemia.  History of MDS.  Pancytopenia due to known MDS on azacitidine.  Patient on hemoglobin of 7-8.  Received 1 unit packed red blood cells during that hospital admission.  Recent endoscopy eval and having capsule endoscopy outpatient.   Prior to Admission medications   Medication Sig Start Date End Date Taking? Authorizing Provider  acetaminophen  (TYLENOL ) 500 MG tablet Take 2 tablets (1,000 mg total) by mouth every 6 (six) hours as needed for headache, mild pain or fever. Patient taking differently: Take 1,000 mg by mouth every 8 (eight) hours as needed for headache, mild pain (pain score 1-3) or fever. 11/28/20   Patsy Lenis, MD  albuterol  (VENTOLIN  HFA) 108 [phone] Base) MCG/ACT inhaler Inhale 2 puffs into the lungs every 4 (four) hours as needed for wheezing or shortness of breath. 11/03/20   Pokhrel, Vernal, MD  ascorbic acid  (VITAMIN C) 500 MG tablet Take 500 mg by mouth every morning.    [provider]  atorvastatin  (LIPITOR ) 80 MG tablet Take 80 mg by mouth at  bedtime. 12/14/20   [provider]  busPIRone  (BUSPAR ) 15 MG tablet Take 15 mg by mouth 3 (three) times daily. 07/02/24   [provider]  Cranberry 450 MG TABS Take 450 mg by mouth in the morning.    [provider]  Cyanocobalamin 5000 MCG TBDP Place 5,000 mcg under the tongue every morning.    [provider]  cyclobenzaprine (FLEXERIL) 10 MG tablet Take 10 mg by mouth 3 (three) times daily as needed for muscle spasms.    [provider]  Docusate Sodium  (DSS) 100 MG CAPS Take 100 mg by mouth in the morning and at bedtime.    [provider]  DULoxetine  (CYMBALTA ) 60 MG capsule Take 60 mg by mouth daily. 07/14/24   [provider]  ferrous sulfate  325 (65 FE) MG tablet Take 325 mg by mouth daily.     [provider]  fluticasone (FLONASE) 50 MCG/ACT nasal spray Place 1 spray into the nose daily. 06/04/22   [provider]  folic acid  (FOLVITE ) 400 MCG tablet Take 400 mcg by mouth every morning.    [provider]  guaiFENesin (ROBITUSSIN) 100 MG/5ML liquid Take 10 mLs by mouth every 4 (four) hours as needed for cough.    [provider]  hydroxychloroquine  (PLAQUENIL ) 200 MG tablet Take 200 mg by mouth 2 (two) times daily.    [provider]  Hypromellose (NATURAL BALANCE  TEARS OP) Place 2 drops into both eyes in the morning and at bedtime.    [provider]  lidocaine  4 % Place 1 patch onto the skin in the morning. 07/29/24 10/27/24  [provider]  LINZESS  145 MCG CAPS capsule Take 145 mcg by mouth daily. 07/11/24   [provider]  loratadine (CLARITIN) 10 MG tablet Take 10 mg by mouth in the morning.    [provider]  LORazepam  (ATIVAN ) 0.5 MG tablet Take 0.5 mg by mouth 2 (two) times daily. 07/02/24   [provider]  melatonin 3 MG TABS tablet Take 3 mg by mouth at bedtime. 07/29/24 10/27/24  [provider]  morphine  (MSIR) 15 MG tablet  Take 15 mg by mouth every 8 (eight) hours as needed for severe pain (pain score 7-10).    [provider]  Multiple Vitamin (MULTIVITAMIN WITH MINERALS) TABS tablet Take 1 tablet by mouth daily. 11/29/20   Patsy Lenis, MD  ondansetron  (ZOFRAN ) 4 MG tablet Take 1 tablet (4 mg total) by mouth every 6 (six) hours as needed for nausea. 07/04/20   Will Almarie MATSU, MD  ondansetron  (ZOFRAN ) 8 MG tablet Take 1 tablet (8 mg total) by mouth every 8 (eight) hours as needed for nausea or vomiting. 08/22/24   [name] [name], MD  pantoprazole  (PROTONIX ) 40 MG tablet Take 1 tablet (40 mg total) by mouth daily. 11/29/20   Patsy Lenis, MD  polyethylene glycol (MIRALAX  / GLYCOLAX ) 17 g packet Take 17 g by mouth daily as needed for mild constipation. Patient taking differently: Take 17 g by mouth daily. 11/03/20   Pokhrel, [place]man, MD  pregabalin  (LYRICA ) 75 MG capsule Take 75 mg by mouth 2 (two) times daily. 07/02/24   [provider]  prochlorperazine  (COMPAZINE ) 10 MG tablet Take 1 tablet (10 mg total) by mouth every 6 (six) hours as needed for nausea or vomiting. 08/22/24   [name] [name], MD  senna-docusate (SENOKOT-S) 8.6-50 MG tablet Take 2 tablets by mouth 2 (two) times daily. 11/11/22 09/29/24  [provider]  SUMAtriptan  (IMITREX ) 50 MG tablet Take 50 mg by mouth every 2 (two) hours as needed for migraine or headache. 07/01/24   [provider]  tamsulosin  (FLOMAX ) 0.4 MG CAPS capsule Take 0.4 mg by mouth every morning.    [provider]  thiamine  100 MG tablet Take 1 tablet (100 mg total) by mouth daily. 02/25/21   Cheryle Page, MD  traZODone  (DESYREL ) 50 MG tablet Take 50 mg by mouth at bedtime. 08/27/21 10/27/24  [provider]    Allergies: Cefepime, Iodinated contrast media, Iohexol , Tositumomab, Fentanyl , and Iodine    Review of Systems  Constitutional:  Negative for chills and fever.  HENT:  Negative for ear pain and sore throat.   Eyes:   Negative for pain and visual disturbance.  Respiratory:  Negative for cough and shortness of breath.   Cardiovascular:  Negative for chest pain and palpitations.  Gastrointestinal:  Negative for abdominal pain and vomiting.  Genitourinary:  Negative for dysuria and hematuria.  Musculoskeletal:  Negative for arthralgias and back pain.  Skin:  Negative for color change and rash.  Neurological:  Negative for seizures and syncope.  All other systems reviewed and are negative.   Updated Vital Signs BP (!) 116/93   Pulse 78   Temp 97.8 F (36.6 C) (Oral)   Resp 18   Ht 5' 10 (1.778 m)   Wt 91.6 kg   SpO2 100%  BMI 28.98 kg/m   Physical Exam Vitals and nursing note reviewed.  Constitutional:      General: He is not in acute distress.    Appearance: He is well-developed.  HENT:     Head: Normocephalic and atraumatic.  Eyes:     Conjunctiva/sclera: Conjunctivae normal.  Cardiovascular:     Rate and Rhythm: Normal rate and regular rhythm.     Heart sounds: No murmur heard. Pulmonary:     Effort: Pulmonary effort is normal. No respiratory distress.     Breath sounds: Normal breath sounds.  Abdominal:     Palpations: Abdomen is soft.     Tenderness: There is no abdominal tenderness.  Musculoskeletal:        General: No swelling.     Cervical back: Neck supple.  Skin:    General: Skin is warm and dry.     Capillary Refill: Capillary refill takes less than 2 seconds.  Neurological:     Mental Status: He is alert.  Psychiatric:        Mood and Affect: Mood normal.     (all labs ordered are listed, but only abnormal results are displayed) Labs Reviewed  COMPREHENSIVE METABOLIC PANEL WITH GFR - Abnormal; Notable for the following components:      Result Value   Sodium 133 (*)    Glucose, Bld 117 (*)    Calcium  8.2 (*)    Total Protein 6.0 (*)    Albumin 3.0 (*)    All other components within normal limits  CBC - Abnormal; Notable for the following components:   WBC  1.9 (*)    RBC 2.28 (*)    Hemoglobin 6.6 (*)    HCT 21.0 (*)    RDW 16.1 (*)    Platelets 90 (*)    All other components within normal limits  I-STAT CHEM 8, ED - Abnormal; Notable for the following components:   Sodium 134 (*)    Glucose, Bld 111 (*)    Calcium , Ion 1.12 (*)    Hemoglobin 6.8 (*)    HCT 20.0 (*)    All other components within normal limits  URINALYSIS, ROUTINE W REFLEX MICROSCOPIC  POC OCCULT BLOOD, ED  TYPE AND SCREEN  PREPARE RBC (CROSSMATCH)    EKG: None  Radiology: No results found.   Procedures   Medications Ordered in the ED  0.9 %  sodium chloride  infusion (Manually program via Guardrails IV Fluids) ( Intravenous New Bag/Given 09/19/24 2211)                                    Medical Decision Making Amount and/or Complexity of Data Reviewed Labs: ordered.  Risk Prescription drug management.     HPI:    Patient presents because of possible anemia.  Patient has a known history of MDS.  Patient states that he has had about 3 blood transfusions over the past couple of weeks because of this.  Patient states that he lives at the facility.  Took his laboratory workup today which showed him to be anemic with hemoglobin below 7.  Is not noticed any kind of new dark stools.  No bright red blood per rectum.  No nausea or vomiting.  Denies abdominal pain.  Does endorse some slight fatigue.  Patient states that this is pretty typical for his presentations for  Previous medical history reviewed : Patient last admitted to outside  hospital and discharged on September 13, 2024.  Was admitted in setting of UTI and anemia.  History of MDS.  Pancytopenia due to known MDS on azacitidine.  Patient on hemoglobin of 7-8.  Received 1 unit packed red blood cells during that hospital admission.  Recent endoscopy eval and having capsule endoscopy outpatient.   MDM:   Upon exam, patient ANO x 3 with GCS 15.  Hemodynamically stable.  Soft benign abdomen.  No guarding  or tenderness.  Will obtain CBC.  Will assess for any kind of significant anemia.  No concerns for GI bleed at this time.  Likely because MDS.  Will also obtain CMP.  Will make sure there is no large electrolyte derangements.  Obtaining UA as well in setting of recurrent UTIs.  Reevaluation:   Upon reexamination, patient hemodynamically stable.  Remains A&O x 3 with GCS 15.   CBC showed hemoglobin 6.6.  Consistent for MDS.  1 unit of packed red blood cells ordered.  Once again, no concerns for any kind of GI loss at this time.  No significant BUN elevation of concern for upper GI bleed.  Denies melena or bright red blood per rectum.  No indication for imaging.  Soft and benign abdomen.  Should be signed out stable condition.  Will see 1 unit packed red blood cells.  Pending UA as well.   Interventions: 1 u PRBC    Social Determinant of Health:    Disposition and Follow Up: PCP   CRITICAL CARE Performed by: Lavonia LOISE Pat   Total critical care time: 45 minutes  Critical care time was exclusive of separately billable procedures and treating other patients.  Critical care was necessary to treat or prevent imminent or life-threatening deterioration.  Critical care was time spent personally by me on the following activities: development of treatment plan with patient and/or surrogate as well as nursing, discussions with consultants, evaluation of patient's response to treatment, examination of patient, obtaining history from patient or surrogate, ordering and performing treatments and interventions, ordering and review of laboratory studies, ordering and review of radiographic studies, pulse oximetry and re-evaluation of patient's condition.       Final diagnoses:  Anemia, unspecified type  Thrombocytopenia    ED Discharge Orders     None          Pat Lavonia LOISE, MD 09/19/24 2339

## 2024-09-19 NOTE — ED Notes (Signed)
 Daughter called with concerns of UTI, information passed along to MD.

## 2024-09-19 NOTE — ED Triage Notes (Addendum)
 Patient BIB [facility] from [name] for low hemoglobin. Facility did labs this morning and reports a Hgb of 6.6. Patient denies complaints outside of baseline hip, shoulder, and back pain from being a quarterback. Patient is A&Ox4, patient reports dark stools b/c he takes an iron supplement.

## 2024-09-19 NOTE — ED Provider Notes (Signed)
 Care assumed from Dr. Simon, patient with myelodysplastic syndrome and symptomatic anemia getting blood transfusion.  Also has urinalysis pending.  Plan is for discharge once transfusion is complete.  Patient is resting comfortably following completion of transfusion.  Urinalysis shows mild pyuria without bacteria, no indication for antibiotics.   Raford Lenis, MD 09/20/24 [phone]

## 2024-09-20 DIAGNOSIS — D649 Anemia, unspecified: Secondary | ICD-10-CM | POA: Diagnosis not present

## 2024-09-20 LAB — TYPE AND SCREEN
ABO/RH(D): A POS
Antibody Screen: NEGATIVE
Unit division: 0

## 2024-09-20 LAB — URINALYSIS, ROUTINE W REFLEX MICROSCOPIC
Bilirubin Urine: NEGATIVE
Glucose, UA: NEGATIVE mg/dL
Hgb urine dipstick: NEGATIVE
Ketones, ur: NEGATIVE mg/dL
Nitrite: NEGATIVE
Protein, ur: NEGATIVE mg/dL
Specific Gravity, Urine: 1.005 (ref 1.005–1.030)
pH: 6 (ref 5.0–8.0)

## 2024-09-20 LAB — BPAM RBC
Blood Product Expiration Date: 202511192359
ISSUE DATE / TIME: 202510302204
Unit Type and Rh: 6200

## 2024-09-20 NOTE — ED Notes (Signed)
 Report called to Counselling Psychologist at [facility])

## 2024-09-20 NOTE — ED Notes (Signed)
 [facility] called

## 2024-09-20 NOTE — Discharge Instructions (Addendum)
 Please follow-up with your hematologist/oncologist as scheduled.

## 2024-09-23 ENCOUNTER — Encounter: Payer: Self-pay | Admitting: Internal Medicine

## 2024-09-27 ENCOUNTER — Other Ambulatory Visit: Payer: Self-pay | Admitting: Physician Assistant

## 2024-09-27 DIAGNOSIS — D469 Myelodysplastic syndrome, unspecified: Secondary | ICD-10-CM

## 2024-09-27 NOTE — Progress Notes (Signed)
 [facility] OFFICE PROGRESS NOTE  [name] LABOR, MD [address] [place] [address]  DIAGNOSIS: Myelodysplastic syndrome with excess blast (MDS-ES-I diagnosed in September 2025. IPSS-M: High risk (5.79)  PRIOR THERAPY: None   CURRENT THERAPY: Vidaza days 1-7 every 4 weeks. First dose on 09/02/24. Status post 1 cycle.   INTERVAL HISTORY: Jorge Sherman 83 y.o. male returns today for a follow-up visit.  The patient was last seen in the clinic on 08/22/2024 by Dr. [name].  At that point in time, the patient was diagnosed with MDS.  He underwent days 1 through 5 of his treatment with Vidaza on 09/02/2024.  He missed days 7 and 8 secondary to hospitalization.  He was hospitalized on 09/07/2024 for shoulder pain and confusion.  He was admitted at the [facility] secondary to UTI.  To the patient's knowledge she does not have any follow-ups with urology.  He reports there is a provider at the nursing facility that he lives at [facility]).  his hospital admission he was given 1 unit of blood.  The patient is reportedly going to have a capsule endoscopy outpatient.   He tells me this was completed by Dr. Rosalie and was told there was no source of bleeding. He takes an iron supplement. He does bruise easy and has senile purpura on his upper and lower extremities. Otherwise, denies any epistaxis, gingival bleeding, hemoptysis, hematemesis, melena, or hematochezia.  He states he does not take any blood thinners. 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  He can tell when he requires a blood transfusion and he feels like he needs a blood transfusion at this time.  He has some fatigue and weakness.  He also presented to the ER on 09/19/24 with anemia for which he received a blood transfusion. He previously had B12, iron, and folate testing performed.    He has a history of a heart murmur and had a stent placed approximately 10-15 years ago. His previous cardiologist, Dr. Claudene, has retired, and he has not been reassigned to another cardiologist. He is aware of his heart murmur but has not had recent follow-up for it.  He denies any fever, chills, night sweats, or appetite changes.  He reports a good appetite.  He sometimes may feel lightheaded.  He wears supplemental oxygen  at baseline.  He is here today for evaluation and repeat blood work before undergoing cycle 2.    MEDICAL HISTORY: Past Medical History:  Diagnosis Date   Chronic pain    Depressive disorder, not elsewhere classified    Dizziness and giddiness    Essential and other specified forms of tremor    Headache(784.0)    Leukocytosis, unspecified    Obstructive sleep apnea (adult) (pediatric)    Pain in joint, lower leg    Septic joint of left knee joint (HCC) 04/22/2019   Spinal stenosis, lumbar region, without neurogenic claudication    Thoracic or lumbosacral neuritis or radiculitis, unspecified    Type II or unspecified type diabetes mellitus without mention of complication, not stated as uncontrolled    Unspecified cardiovascular disease    Unspecified essential hypertension     ALLERGIES:  is allergic to cefepime, iodinated contrast media, iohexol , tositumomab, fentanyl , and iodine.  MEDICATIONS:  Current Outpatient Medications  Medication Sig Dispense Refill   acetaminophen  (TYLENOL ) 500 MG tablet Take 2 tablets (1,000 mg total)  by mouth every 6 (six) hours as needed for headache, mild pain or fever. (Patient taking differently: Take 1,000 mg by mouth every 8 (eight) hours as needed for headache, mild pain (pain score 1-3) or fever.) 30 tablet 0   albuterol  (VENTOLIN  HFA) 108 (90 Base) MCG/ACT inhaler Inhale 2 puffs into the lungs every 4 (four) hours as needed for wheezing or shortness of breath. 1 each 0   ascorbic acid  (VITAMIN C) 500 MG tablet Take 500 mg by  mouth every morning.     atorvastatin  (LIPITOR ) 80 MG tablet Take 80 mg by mouth at bedtime.     busPIRone  (BUSPAR ) 15 MG tablet Take 15 mg by mouth 3 (three) times daily.     Cranberry 450 MG TABS Take 450 mg by mouth in the morning.     Cyanocobalamin 5000 MCG TBDP Place 5,000 mcg under the tongue every morning.     cyclobenzaprine (FLEXERIL) 10 MG tablet Take 10 mg by mouth 3 (three) times daily as needed for muscle spasms.     Docusate Sodium  (DSS) 100 MG CAPS Take 100 mg by mouth in the morning and at bedtime.     DULoxetine  (CYMBALTA ) 60 MG capsule Take 60 mg by mouth daily.     ferrous sulfate  325 (65 FE) MG tablet Take 325 mg by mouth daily.      fluticasone (FLONASE) 50 MCG/ACT nasal spray Place 1 spray into the nose daily.     folic acid  (FOLVITE ) 400 MCG tablet Take 400 mcg by mouth every morning.     guaiFENesin (ROBITUSSIN) 100 MG/5ML liquid Take 10 mLs by mouth every 4 (four) hours as needed for cough.     [Paused] hydroxychloroquine  (PLAQUENIL ) 200 MG tablet Take 200 mg by mouth 2 (two) times daily.     Hypromellose (NATURAL BALANCE TEARS OP) Place 2 drops into both eyes in the morning and at bedtime.     lidocaine  4 % Place 1 patch onto the skin in the morning.     LINZESS  145 MCG CAPS capsule Take 145 mcg by mouth daily.     loratadine (CLARITIN) 10 MG tablet Take 10 mg by mouth in the morning.     LORazepam  (ATIVAN ) 0.5 MG tablet Take 0.5 mg by mouth 2 (two) times daily.     melatonin 3 MG TABS tablet Take 3 mg by mouth at bedtime.     morphine  (MSIR) 15 MG tablet Take 15 mg by mouth every 8 (eight) hours as needed for severe pain (pain score 7-10).     Multiple Vitamin (MULTIVITAMIN WITH MINERALS) TABS tablet Take 1 tablet by mouth daily.     ondansetron  (ZOFRAN ) 4 MG tablet Take 1 tablet (4 mg total) by mouth every 6 (six) hours as needed for nausea. 20 tablet 0   ondansetron  (ZOFRAN ) 8 MG tablet Take 1 tablet (8 mg total) by mouth every 8 (eight) hours as needed for nausea  or vomiting. 30 tablet 1   pantoprazole  (PROTONIX ) 40 MG tablet Take 1 tablet (40 mg total) by mouth daily.     polyethylene glycol (MIRALAX  / GLYCOLAX ) 17 g packet Take 17 g by mouth daily as needed for mild constipation. (Patient taking differently: Take 17 g by mouth daily.) 14 each 0   pregabalin  (LYRICA ) 75 MG capsule Take 75 mg by mouth 2 (two) times daily.     prochlorperazine  (COMPAZINE ) 10 MG tablet Take 1 tablet (10 mg total) by mouth every 6 (six) hours as needed for  nausea or vomiting. 30 tablet 1   SUMAtriptan  (IMITREX ) 50 MG tablet Take 50 mg by mouth every 2 (two) hours as needed for migraine or headache.     tamsulosin  (FLOMAX ) 0.4 MG CAPS capsule Take 0.4 mg by mouth every morning.     thiamine  100 MG tablet Take 1 tablet (100 mg total) by mouth daily. 30 tablet 0   traZODone  (DESYREL ) 50 MG tablet Take 50 mg by mouth at bedtime.     No current facility-administered medications for this visit.   Facility-Administered Medications Ordered in Other Visits  Medication Dose Route Frequency Provider Last Rate Last Admin   0.9 %  sodium chloride  infusion   Intravenous Once Cruzita Lipa L, PA-C       azaCITIDine (VIDAZA) chemo injection 162.5 mg  75 mg/m2 (Treatment Plan Recorded) Subcutaneous Once Mohamed, Mohamed, MD       ondansetron  (ZOFRAN ) tablet 8 mg  8 mg Oral Once [name] [name], MD        SURGICAL HISTORY:  Past Surgical History:  Procedure Laterality Date   BACK SURGERY     back surgey     times 3   GIVENS CAPSULE STUDY N/A 08/09/2024   Procedure: IMAGING PROCEDURE, GI TRACT, INTRALUMINAL, VIA CAPSULE;  Surgeon: Rosalie Kitchens, MD;  Location: [facility] ENDOSCOPY;  Service: Gastroenterology;  Laterality: N/A;   IR BONE MARROW BIOPSY & ASPIRATION  08/14/2024   IR INJECT/THERA/INC NEEDLE/CATH/PLC EPI/LUMB/SAC W/IMG  11/26/2020    REVIEW OF SYSTEMS:   Review of Systems  Constitutional: Positive for fatigue and generalized weakness.  Negative for appetite change,  chills, fever and unexpected weight change.  HENT:  Negative for mouth sores, nosebleeds, sore throat and trouble swallowing.   Eyes: Negative for eye problems and icterus.  Respiratory: Negative for cough, hemoptysis, shortness of breath and wheezing.   Cardiovascular: Negative for chest pain and leg swelling.  Gastrointestinal: Negative for abdominal pain, constipation, diarrhea, nausea and vomiting.  Genitourinary: Chronic dysuria. Negative for bladder incontinence, frequency and hematuria.   Musculoskeletal:  Negative for back pain, gait problem, neck pain and neck stiffness.  Skin: Negative for itching and rash.  Positive for senile purpura. Neurological: Negative for dizziness, extremity weakness, gait problem, headaches, and seizures.  Hematological: Negative for adenopathy.  Psychiatric/Behavioral: Negative for confusion, depression and sleep disturbance. The patient is not nervous/anxious.     PHYSICAL EXAMINATION:  Blood pressure 95/83, pulse 75, temperature 98 F (36.7 C), temperature source Temporal, resp. rate 16, height 5' 10 (1.778 m), SpO2 100%.  ECOG PERFORMANCE STATUS: 2  Physical Exam  Constitutional: Oriented to person, place, and time and elderly appearing male and in no distress.  HENT:  Head: Normocephalic and atraumatic.  Mouth/Throat: Oropharynx is clear and moist. No oropharyngeal exudate.  Eyes: Conjunctivae are normal. Right eye exhibits no discharge. Left eye exhibits no discharge. No scleral icterus.  Neck: Normal range of motion. Neck supple.  Cardiovascular: Normal rate, regular rhythm, normal heart sounds and intact distal pulses.   Pulmonary/Chest: Effort normal and breath sounds normal. No respiratory distress. No wheezes. No rales. Wears supplemental oxygen .  Abdominal: Soft. Bowel sounds are normal. Exhibits no distension and no mass. There is no tenderness.  Musculoskeletal: Normal range of motion. Exhibits no edema.  Lymphadenopathy:    No  cervical adenopathy.  Neurological: Alert and oriented to person, place, and time. Exhibits muscle wasting. Examined in the wheelchair.  Skin: Skin is warm and dry. Positive for senile purpura. No rash noted. Not diaphoretic.  No erythema. No pallor.  Psychiatric: Mood, memory and judgment normal.  Vitals reviewed.  LABORATORY DATA: Lab Results  Component Value Date   WBC 1.7 (L) 09/30/2024   HGB 7.4 (L) 09/30/2024   HCT 23.4 (L) 09/30/2024   MCV 89.7 09/30/2024   PLT 124 (L) 09/30/2024      Chemistry      Component Value Date/Time   NA 136 09/30/2024 1107   K 4.9 09/30/2024 1107   CL 102 09/30/2024 1107   CO2 31 09/30/2024 1107   BUN 20 09/30/2024 1107   CREATININE 1.08 09/30/2024 1107      Component Value Date/Time   CALCIUM  8.8 (L) 09/30/2024 1107   ALKPHOS 55 09/19/2024 2026   AST 15 09/19/2024 2026   AST 13 (L) 08/22/2024 1101   ALT 14 09/19/2024 2026   ALT 9 08/22/2024 1101   BILITOT 0.5 09/19/2024 2026   BILITOT 0.5 08/22/2024 1101       RADIOGRAPHIC STUDIES:  No results found.   ASSESSMENT/PLAN:  This is a very pleasant 83 year old with myelodysplastic syndrome with excess blasts, high risk IPSS-M with associated anemia   Diagnosed in September 2025, classified as high-risk IPSS-M with a leukemia-free survival of approximately 1.5 years and an average survival of 1.7 years. There is a 14.3% chance of transformation to acute myeloid leukemia within one year. Bone marrow transplant is not an option due to age. - Initiate azacitidine treatment starting September 02, 2024, with subcutaneous injections for 7 consecutive days every 4 weeks.  The patient received days 1 through 5 of treatment.  He is hospitalized again [place] to UTI and sepsis and missed days 7 and 8.  The patient has been requiring frequent blood transfusions.  The patient was seen with Dr. [name] today.  Labs were reviewed.  His Hbg is 7.4.   We were able to arrange for 1 unit of blood on  11/12. I gave him a copy of his schedule with the new arrival time. I will also see about extending his appointment for possible blood with day 7 or 8 next week.   His total WBC is 1.7 and his ANC is 0.6. Per Dr. [name], this is related to his MDS, therefore, he would recommend to proceed with treatment today as scheduled.   We will arrange for lab only visits every week. I have added standing orders for sample to blood bank.    He will continue his iron supplement.   Recurrent urinary tract infection Recurrent urinary tract infections with dysuria. No current antibiotics or urinary analgesics. No urology follow-up. - Instructed to inquire at [name] about obtaining Azo for urinary relief and for monitoring. If he has concerns related to UTI to reach out to provider at his facility or PCP.   Cardiac murmur Known cardiac murmur with previous stent placement. No recent cardiology follow-up due to retirement of previous cardiologist. - Recommended contacting previous cardiologist's office to be reassigned to a new cardiologist. - Suggested obtaining an echocardiogram to assess severity of murmur.  The patient was advised to call immediately if he has any concerning symptoms in the interval. The patient voices understanding of current disease status and treatment options and is in agreement with the current care plan. All questions were answered. The patient knows to call the clinic with any problems, questions or concerns. We can certainly see the patient much sooner if necessary   Orders Placed This Encounter  Procedures   Informed Consent Details: Physician/Practitioner Attestation; Transcribe  to consent form and obtain patient signature    Standing Status:   Future    Expiration Date:   09/30/2025    Physician/Practitioner attestation of informed consent for blood and or blood product transfusion:   I, the physician/practitioner, attest that I have discussed with the patient the  benefits, risks, side effects, alternatives, likelihood of achieving goals and potential problems during recovery for the procedure that I have provided informed consent.    Product(s):   All Product(s)   Care order/instruction    Transfuse Parameters    Standing Status:   Future    Expiration Date:   09/30/2025   Type and screen         Standing Status:   Future    Expected Date:   09/30/2024    Expiration Date:   09/30/2025     Kimbra Marcelino L Greenly Rarick, PA-C 09/30/24  ADDENDUM: Hematology/oncology Attending:  I had a face-to-face encounter with the patient today.  I reviewed his record, lab and recommended his care plan.  This is a very pleasant 83 years old white male diagnosed with myelodysplastic syndrome with excess blasts in September 2025 with IPSS-M high risk.  The patient started treatment with azacitidine subcutaneously on days 1-5 and 8-9 every 4 weeks status post 1 cycle.  He tolerated the first cycle of his treatment well but he was admitted to [facility] hospital secondary to UTI.  He is scheduled for capsule endoscopy soon for evaluation of gastrointestinal bleed. The patient is here today for reevaluation before starting cycle #2.  He is feeling fine with no complaints except for the fatigue and the ecchymosis of the upper extremities secondary to thrombocytopenia. I recommended for the patient to continue his treatment as planned and he will receive day 1 of cycle #2 today. Will continue to monitor his lab work closely on a weekly basis.  For the anemia, we will arrange for the patient to receive 1 unit of PRBCs transfusion. The patient will come back for follow-up visit in 4 weeks for evaluation.  He was advised to call immediately if he has any other concerning symptoms in the interval. Disclaimer: This note was dictated with voice recognition software. Similar sounding words can inadvertently be transcribed and may be missed upon review. [name] MARLA [name],  MD

## 2024-09-30 ENCOUNTER — Inpatient Hospital Stay: Attending: Internal Medicine

## 2024-09-30 ENCOUNTER — Inpatient Hospital Stay: Admitting: Physician Assistant

## 2024-09-30 ENCOUNTER — Inpatient Hospital Stay

## 2024-09-30 ENCOUNTER — Telehealth: Payer: Self-pay

## 2024-09-30 VITALS — BP 95/83 | HR 75 | Temp 98.0°F | Resp 16 | Ht 70.0 in

## 2024-09-30 VITALS — BP 105/50 | HR 74 | Temp 97.9°F | Resp 18

## 2024-09-30 DIAGNOSIS — D692 Other nonthrombocytopenic purpura: Secondary | ICD-10-CM | POA: Insufficient documentation

## 2024-09-30 DIAGNOSIS — Z79899 Other long term (current) drug therapy: Secondary | ICD-10-CM | POA: Insufficient documentation

## 2024-09-30 DIAGNOSIS — Z888 Allergy status to other drugs, medicaments and biological substances status: Secondary | ICD-10-CM | POA: Diagnosis not present

## 2024-09-30 DIAGNOSIS — Z881 Allergy status to other antibiotic agents status: Secondary | ICD-10-CM | POA: Insufficient documentation

## 2024-09-30 DIAGNOSIS — Z5111 Encounter for antineoplastic chemotherapy: Secondary | ICD-10-CM | POA: Insufficient documentation

## 2024-09-30 DIAGNOSIS — D469 Myelodysplastic syndrome, unspecified: Secondary | ICD-10-CM

## 2024-09-30 DIAGNOSIS — Z91041 Radiographic dye allergy status: Secondary | ICD-10-CM | POA: Insufficient documentation

## 2024-09-30 DIAGNOSIS — I959 Hypotension, unspecified: Secondary | ICD-10-CM

## 2024-09-30 DIAGNOSIS — Z885 Allergy status to narcotic agent status: Secondary | ICD-10-CM | POA: Insufficient documentation

## 2024-09-30 DIAGNOSIS — D696 Thrombocytopenia, unspecified: Secondary | ICD-10-CM | POA: Insufficient documentation

## 2024-09-30 DIAGNOSIS — Z87440 Personal history of urinary (tract) infections: Secondary | ICD-10-CM | POA: Diagnosis not present

## 2024-09-30 DIAGNOSIS — R011 Cardiac murmur, unspecified: Secondary | ICD-10-CM | POA: Insufficient documentation

## 2024-09-30 DIAGNOSIS — K2970 Gastritis, unspecified, without bleeding: Secondary | ICD-10-CM | POA: Diagnosis not present

## 2024-09-30 DIAGNOSIS — Z9981 Dependence on supplemental oxygen: Secondary | ICD-10-CM | POA: Diagnosis not present

## 2024-09-30 LAB — CBC WITH DIFFERENTIAL (CANCER CENTER ONLY)
Abs Immature Granulocytes: 0.05 K/uL (ref 0.00–0.07)
Basophils Absolute: 0 K/uL (ref 0.0–0.1)
Basophils Relative: 0 %
Eosinophils Absolute: 0 K/uL (ref 0.0–0.5)
Eosinophils Relative: 0 %
HCT: 23.4 % — ABNORMAL LOW (ref 39.0–52.0)
Hemoglobin: 7.4 g/dL — ABNORMAL LOW (ref 13.0–17.0)
Immature Granulocytes: 3 %
Lymphocytes Relative: 54 %
Lymphs Abs: 0.9 K/uL (ref 0.7–4.0)
MCH: 28.4 pg (ref 26.0–34.0)
MCHC: 31.6 g/dL (ref 30.0–36.0)
MCV: 89.7 fL (ref 80.0–100.0)
Monocytes Absolute: 0.1 K/uL (ref 0.1–1.0)
Monocytes Relative: 6 %
Neutro Abs: 0.6 K/uL — ABNORMAL LOW (ref 1.7–7.7)
Neutrophils Relative %: 37 %
Platelet Count: 124 K/uL — ABNORMAL LOW (ref 150–400)
RBC: 2.61 MIL/uL — ABNORMAL LOW (ref 4.22–5.81)
RDW: 16 % — ABNORMAL HIGH (ref 11.5–15.5)
Smear Review: NORMAL
WBC Count: 1.7 K/uL — ABNORMAL LOW (ref 4.0–10.5)
nRBC: 0 % (ref 0.0–0.2)

## 2024-09-30 LAB — SAMPLE TO BLOOD BANK

## 2024-09-30 LAB — BASIC METABOLIC PANEL - CANCER CENTER ONLY
Anion gap: 3 — ABNORMAL LOW (ref 5–15)
BUN: 20 mg/dL (ref 8–23)
CO2: 31 mmol/L (ref 22–32)
Calcium: 8.8 mg/dL — ABNORMAL LOW (ref 8.9–10.3)
Chloride: 102 mmol/L (ref 98–111)
Creatinine: 1.08 mg/dL (ref 0.61–1.24)
GFR, Estimated: >60 mL/min (ref >60)
Glucose, Bld: 98 mg/dL (ref 70–99)
Potassium: 4.9 mmol/L (ref 3.5–5.1)
Sodium: 136 mmol/L (ref 135–145)

## 2024-09-30 MED ORDER — ONDANSETRON HCL 8 MG PO TABS
8.0000 mg | ORAL_TABLET | Freq: Once | ORAL | Status: AC
  Administered 2024-09-30: 8 mg via ORAL
  Filled 2024-09-30: qty 1

## 2024-09-30 MED ORDER — AZACITIDINE CHEMO SQ INJECTION
75.0000 mg/m2 | Freq: Once | INTRAMUSCULAR | Status: AC
  Administered 2024-09-30: 162.5 mg via SUBCUTANEOUS
  Filled 2024-09-30: qty 6.5

## 2024-09-30 MED ORDER — SODIUM CHLORIDE 0.9 % IV SOLN: Freq: Once | INTRAVENOUS | Status: DC

## 2024-09-30 NOTE — Patient Instructions (Signed)
 [facility] - A DEPT OF [place]  Discharge Instructions: Thank you for choosing [facility] to provide your oncology and hematology care.   If you have a lab appointment with the Cancer Center, please go directly to the Cancer Center and check in at the registration area.   Wear comfortable clothing and clothing appropriate for easy access to any Portacath or PICC line.   We strive to give you quality time with your provider. You may need to reschedule your appointment if you arrive late (15 or more minutes).  Arriving late affects you and other patients whose appointments are after yours.  Also, if you miss three or more appointments without notifying the office, you may be dismissed from the clinic at the provider's discretion.      For prescription refill requests, have your pharmacy contact our office and allow 72 hours for refills to be completed.    Today you received the following chemotherapy and/or immunotherapy agents vidaza       To help prevent nausea and vomiting after your treatment, we encourage you to take your nausea medication as directed.  BELOW ARE SYMPTOMS THAT SHOULD BE REPORTED IMMEDIATELY: *FEVER GREATER THAN 100.4 F (38 C) OR HIGHER *CHILLS OR SWEATING *NAUSEA AND VOMITING THAT IS NOT CONTROLLED WITH YOUR NAUSEA MEDICATION *UNUSUAL SHORTNESS OF BREATH *UNUSUAL BRUISING OR BLEEDING *URINARY PROBLEMS (pain or burning when urinating, or frequent urination) *BOWEL PROBLEMS (unusual diarrhea, constipation, pain near the anus) TENDERNESS IN MOUTH AND THROAT WITH OR WITHOUT PRESENCE OF ULCERS (sore throat, sores in mouth, or a toothache) UNUSUAL RASH, SWELLING OR PAIN  UNUSUAL VAGINAL DISCHARGE OR ITCHING   Items with * indicate a potential emergency and should be followed up as soon as possible or go to the Emergency Department if any problems should occur.  Please show the CHEMOTHERAPY ALERT CARD or IMMUNOTHERAPY  ALERT CARD at check-in to the Emergency Department and triage nurse.  Should you have questions after your visit or need to cancel or reschedule your appointment, please contact [name]  Dept: [phone]  and follow the prompts.  Office hours are 8:00 a.m. to 4:30 p.m. Monday - Friday. Please note that voicemails left after 4:00 p.m. may not be returned until the following business day.  We are closed weekends and major holidays. You have access to a nurse at all times for urgent questions. Please call the main number to the clinic Dept: [phone] and follow the prompts.   For any non-urgent questions, you may also contact your provider using MyChart. We now offer e-Visits for anyone 14 and older to request care online for non-urgent symptoms. For details visit [email or web address].   Also download the MyChart app! Go to the app store, search MyChart, open the app, select [place], and log in with your MyChart username and password.

## 2024-09-30 NOTE — Telephone Encounter (Signed)
 Tried to contact Jorge Sherman in regards to appt time changes on 10/02/24.  LVM with facility stating that patient needs a blood transfusion and he is scheduled for 10/02/24 @ 1230 prior to his treatment.  Asked for a return call with any questions.

## 2024-09-30 NOTE — Progress Notes (Signed)
 Pt educated to hydrate well at home, pt offered beverage here at infusion, pt given update appointments and pt understood, dc from infusion in wheelchair with no complaints

## 2024-10-01 ENCOUNTER — Telehealth: Payer: Self-pay | Admitting: *Deleted

## 2024-10-01 ENCOUNTER — Inpatient Hospital Stay

## 2024-10-01 VITALS — BP 103/63 | HR 94 | Temp 98.2°F | Resp 18

## 2024-10-01 DIAGNOSIS — D469 Myelodysplastic syndrome, unspecified: Secondary | ICD-10-CM

## 2024-10-01 DIAGNOSIS — Z5111 Encounter for antineoplastic chemotherapy: Secondary | ICD-10-CM | POA: Diagnosis not present

## 2024-10-01 MED ORDER — ONDANSETRON HCL 8 MG PO TABS
8.0000 mg | ORAL_TABLET | Freq: Once | ORAL | Status: AC
  Administered 2024-10-01: 8 mg via ORAL
  Filled 2024-10-01: qty 1

## 2024-10-01 MED ORDER — AZACITIDINE CHEMO SQ INJECTION
75.0000 mg/m2 | Freq: Once | INTRAMUSCULAR | Status: AC
  Administered 2024-10-01: 162.5 mg via SUBCUTANEOUS
  Filled 2024-10-01: qty 6.5

## 2024-10-01 NOTE — Progress Notes (Signed)
 2x attempt to relay test results as per Dr. Maralee.  Lm on vm for call back

## 2024-10-01 NOTE — Patient Instructions (Signed)
 [facility] - A DEPT OF [place]  Discharge Instructions: Thank you for choosing [facility] to provide your oncology and hematology care.   If you have a lab appointment with the Cancer Center, please go directly to the Cancer Center and check in at the registration area.   Wear comfortable clothing and clothing appropriate for easy access to any Portacath or PICC line.   We strive to give you quality time with your provider. You may need to reschedule your appointment if you arrive late (15 or more minutes).  Arriving late affects you and other patients whose appointments are after yours.  Also, if you miss three or more appointments without notifying the office, you may be dismissed from the clinic at the provider's discretion.      For prescription refill requests, have your pharmacy contact our office and allow 72 hours for refills to be completed.    Today you received the following chemotherapy and/or immunotherapy agents vidaza       To help prevent nausea and vomiting after your treatment, we encourage you to take your nausea medication as directed.  BELOW ARE SYMPTOMS THAT SHOULD BE REPORTED IMMEDIATELY: *FEVER GREATER THAN 100.4 F (38 C) OR HIGHER *CHILLS OR SWEATING *NAUSEA AND VOMITING THAT IS NOT CONTROLLED WITH YOUR NAUSEA MEDICATION *UNUSUAL SHORTNESS OF BREATH *UNUSUAL BRUISING OR BLEEDING *URINARY PROBLEMS (pain or burning when urinating, or frequent urination) *BOWEL PROBLEMS (unusual diarrhea, constipation, pain near the anus) TENDERNESS IN MOUTH AND THROAT WITH OR WITHOUT PRESENCE OF ULCERS (sore throat, sores in mouth, or a toothache) UNUSUAL RASH, SWELLING OR PAIN  UNUSUAL VAGINAL DISCHARGE OR ITCHING   Items with * indicate a potential emergency and should be followed up as soon as possible or go to the Emergency Department if any problems should occur.  Please show the CHEMOTHERAPY ALERT CARD or IMMUNOTHERAPY  ALERT CARD at check-in to the Emergency Department and triage nurse.  Should you have questions after your visit or need to cancel or reschedule your appointment, please contact [name]  Dept: [phone]  and follow the prompts.  Office hours are 8:00 a.m. to 4:30 p.m. Monday - Friday. Please note that voicemails left after 4:00 p.m. may not be returned until the following business day.  We are closed weekends and major holidays. You have access to a nurse at all times for urgent questions. Please call the main number to the clinic Dept: [phone] and follow the prompts.   For any non-urgent questions, you may also contact your provider using MyChart. We now offer e-Visits for anyone 14 and older to request care online for non-urgent symptoms. For details visit [email or web address].   Also download the MyChart app! Go to the app store, search MyChart, open the app, select [place], and log in with your MyChart username and password.

## 2024-10-01 NOTE — Telephone Encounter (Signed)
 Received call from daughter [name]. Informed her of appt. dates and times until the end of December. Pt. states she is on my chart and can look there too.

## 2024-10-01 NOTE — Telephone Encounter (Signed)
 Spoke with [name] in regards to patients arrival time change on 10/02/24.  She states that she wrote a note and placed in on the schedulers desk.  She also transferred me to the schedulers phone and I left a voicemail informing her that the patients new arrival time is now 1230 due to needing a blood transfusion prior to treatment. Asked for a return call with any questions.

## 2024-10-02 ENCOUNTER — Inpatient Hospital Stay

## 2024-10-02 VITALS — BP 105/50 | HR 80 | Temp 98.0°F | Resp 19

## 2024-10-02 DIAGNOSIS — Z5111 Encounter for antineoplastic chemotherapy: Secondary | ICD-10-CM | POA: Diagnosis not present

## 2024-10-02 DIAGNOSIS — D469 Myelodysplastic syndrome, unspecified: Secondary | ICD-10-CM

## 2024-10-02 LAB — PREPARE RBC (CROSSMATCH)

## 2024-10-02 MED ORDER — ONDANSETRON HCL 8 MG PO TABS
8.0000 mg | ORAL_TABLET | Freq: Once | ORAL | Status: AC
  Administered 2024-10-02: 8 mg via ORAL
  Filled 2024-10-02: qty 1

## 2024-10-02 MED ORDER — AZACITIDINE CHEMO SQ INJECTION
75.0000 mg/m2 | Freq: Once | INTRAMUSCULAR | Status: AC
  Administered 2024-10-02: 162.5 mg via SUBCUTANEOUS
  Filled 2024-10-02: qty 6.5

## 2024-10-02 MED ORDER — ACETAMINOPHEN 325 MG PO TABS
650.0000 mg | ORAL_TABLET | Freq: Once | ORAL | Status: AC
  Administered 2024-10-02: 650 mg via ORAL
  Filled 2024-10-02: qty 2

## 2024-10-02 MED ORDER — SODIUM CHLORIDE 0.9% IV SOLUTION
250.0000 mL | INTRAVENOUS | Status: DC
  Administered 2024-10-02: 100 mL via INTRAVENOUS

## 2024-10-02 MED ORDER — DIPHENHYDRAMINE HCL 25 MG PO CAPS
25.0000 mg | ORAL_CAPSULE | Freq: Once | ORAL | Status: AC
  Administered 2024-10-02: 25 mg via ORAL
  Filled 2024-10-02: qty 1

## 2024-10-02 NOTE — Patient Instructions (Signed)
 Blood Transfusion, Adult A blood transfusion is a procedure in which you receive blood or a type of blood cell (blood component) through an IV. You may need a blood transfusion when you have a low blood count, which is a low number of any blood cell. This may result from a bleeding disorder, illness, injury, or surgery. The blood may come from a donor, or you may be able to have your own blood collected and stored (autologous blood donation) before a planned surgery. The blood given in a transfusion may be made up of different blood components. You may receive: Red blood cells. These carry oxygen to the cells in the body. Platelets. These help your blood to clot. Plasma. This is the liquid part of your blood. It carries proteins and other substances throughout the body. White blood cells. These help you fight infections. If you have hemophilia or another clotting disorder, you may also receive other types of blood products. Depending on the type of blood product, this procedure may take 1-4 hours to complete. Tell a health care provider about: Any bleeding problems you have. Any previous reactions you have had during a blood transfusion. Any allergies you have. All medicines you are taking, including vitamins, herbs, eye drops, creams, and over-the-counter medicines. Any surgeries you have had. Any medical conditions you have. Whether you are pregnant or may be pregnant. What are the risks? Talk with your health care provider about risks. The most common problems include: A mild allergic reaction, such as red, swollen areas of skin (hives) and itching. Fever or chills. This may be the body's response to new blood cells received. This may occur during or up to 4 hours after the transfusion. More serious problems may include: A serious allergic reaction that causes difficulty breathing or swelling around the face and lips. Transfusion-associated circulatory overload (TACO), or too much fluid in  the lungs. This may cause breathing problems. Transfusion-related acute lung injury (TRALI), which causes breathing difficulty and low oxygen in the blood. This can occur within hours of the transfusion or several days later. Iron overload. This can happen after receiving many blood transfusions over a period of time. Infection or virus being transmitted. This is rare because donated blood is carefully tested before it is given. Hemolytic transfusion reaction. This is rare. It happens when the body's defense system (immune system)tries to attack the new blood cells. Symptoms may include fever, chills, nausea, low blood pressure, and low back or chest pain. Transfusion-associated graft-versus-host disease (TAGVHD). This is rare. It happens when donated cells attack the body's healthy tissues. What happens before the procedure? You will have a blood test to check your blood type. This test is done to know what kind of blood your body will accept and to match it to the donor blood. If you are going to have a planned surgery, you may be able to do an autologous blood donation. This may be done in case you need to have a transfusion. You will have your temperature, blood pressure, and pulse checked before the transfusion. If you have had an allergic reaction to a transfusion in the past, you may be given medicine to help prevent a reaction. This medicine may be given to you by mouth (orally) or through an IV. What happens during the procedure?  An IV will be inserted into one of your veins. The bag of blood will be attached to your IV. The blood will then enter through your vein. Your temperature, blood pressure,  and pulse will be monitored during the transfusion. This monitoring is done to detect early signs of a transfusion reaction. Tell your nurse right away if you have any of these symptoms during the transfusion: Shortness of breath or trouble breathing. Chest or back pain. Fever or  chills. Itching or hives. If you have any signs or symptoms of a reaction, your transfusion will be stopped and you may be given medicine. When the transfusion is complete, your IV will be removed. Pressure may be applied to the IV site for a few minutes. A bandage (dressing)will be applied. The procedure may vary among health care providers and hospitals. What happens after the procedure? Your temperature, blood pressure, pulse, breathing rate, and blood oxygen level will be monitored until you leave the hospital or clinic. Your blood may be tested to see how you have responded to the transfusion. You may be warmed with fluids or blankets to maintain a normal body temperature. If you receive your blood transfusion in an outpatient setting, you will be told whom to contact to report any reactions. Where to find more information Visit the [employer]: redcross.org Summary A blood transfusion is a procedure in which you receive blood or a type of blood cell (blood component) through an IV. The blood given in a transfusion may be made up of different blood components. You may receive red blood cells, platelets, plasma, or white blood cells depending on the condition treated. Your temperature, blood pressure, and pulse will be monitored before, during, and after the transfusion. After the transfusion, your blood may be tested to see how your body has responded. This information is not intended to replace advice given to you by your health care provider. Make sure you discuss any questions you have with your health care provider. Document Revised: 02/04/2022 Document Reviewed: 02/04/2022 Elsevier Patient Education  2024 [employer].

## 2024-10-02 NOTE — Progress Notes (Signed)
 Patient C/O of eye irritation, had issue seeing when he woke-up but have gotten better, Dr. Gatha notified and stated to inform patient to F/U with his ophthalmologist. Patient informed and stated he will make appointment.

## 2024-10-03 ENCOUNTER — Inpatient Hospital Stay

## 2024-10-03 VITALS — BP 136/63 | HR 88 | Temp 98.3°F | Resp 20

## 2024-10-03 DIAGNOSIS — N390 Urinary tract infection, site not specified: Secondary | ICD-10-CM | POA: Diagnosis not present

## 2024-10-03 DIAGNOSIS — D469 Myelodysplastic syndrome, unspecified: Secondary | ICD-10-CM

## 2024-10-03 DIAGNOSIS — G928 Other toxic encephalopathy: Secondary | ICD-10-CM | POA: Diagnosis not present

## 2024-10-03 LAB — TYPE AND SCREEN
ABO/RH(D): A POS
Antibody Screen: NEGATIVE
Unit division: 0

## 2024-10-03 LAB — BPAM RBC
Blood Product Expiration Date: 202512042359
ISSUE DATE / TIME: 202511121454
Unit Type and Rh: 6200

## 2024-10-03 MED ORDER — ONDANSETRON HCL 8 MG PO TABS
8.0000 mg | ORAL_TABLET | Freq: Once | ORAL | Status: AC
  Administered 2024-10-03: 8 mg via ORAL
  Filled 2024-10-03: qty 1

## 2024-10-03 MED ORDER — AZACITIDINE CHEMO SQ INJECTION
75.0000 mg/m2 | Freq: Once | INTRAMUSCULAR | Status: AC
  Administered 2024-10-03: 162.5 mg via SUBCUTANEOUS
  Filled 2024-10-03: qty 6.5

## 2024-10-03 NOTE — Patient Instructions (Signed)
 [facility] - A DEPT OF [place]  Discharge Instructions: Thank you for choosing [facility] to provide your oncology and hematology care.   If you have a lab appointment with the Cancer Center, please go directly to the Cancer Center and check in at the registration area.   Wear comfortable clothing and clothing appropriate for easy access to any Portacath or PICC line.   We strive to give you quality time with your provider. You may need to reschedule your appointment if you arrive late (15 or more minutes).  Arriving late affects you and other patients whose appointments are after yours.  Also, if you miss three or more appointments without notifying the office, you may be dismissed from the clinic at the provider's discretion.      For prescription refill requests, have your pharmacy contact our office and allow 72 hours for refills to be completed.    Today you received the following chemotherapy and/or immunotherapy agents vidaza       To help prevent nausea and vomiting after your treatment, we encourage you to take your nausea medication as directed.  BELOW ARE SYMPTOMS THAT SHOULD BE REPORTED IMMEDIATELY: *FEVER GREATER THAN 100.4 F (38 C) OR HIGHER *CHILLS OR SWEATING *NAUSEA AND VOMITING THAT IS NOT CONTROLLED WITH YOUR NAUSEA MEDICATION *UNUSUAL SHORTNESS OF BREATH *UNUSUAL BRUISING OR BLEEDING *URINARY PROBLEMS (pain or burning when urinating, or frequent urination) *BOWEL PROBLEMS (unusual diarrhea, constipation, pain near the anus) TENDERNESS IN MOUTH AND THROAT WITH OR WITHOUT PRESENCE OF ULCERS (sore throat, sores in mouth, or a toothache) UNUSUAL RASH, SWELLING OR PAIN  UNUSUAL VAGINAL DISCHARGE OR ITCHING   Items with * indicate a potential emergency and should be followed up as soon as possible or go to the Emergency Department if any problems should occur.  Please show the CHEMOTHERAPY ALERT CARD or IMMUNOTHERAPY  ALERT CARD at check-in to the Emergency Department and triage nurse.  Should you have questions after your visit or need to cancel or reschedule your appointment, please contact [name]  Dept: [phone]  and follow the prompts.  Office hours are 8:00 a.m. to 4:30 p.m. Monday - Friday. Please note that voicemails left after 4:00 p.m. may not be returned until the following business day.  We are closed weekends and major holidays. You have access to a nurse at all times for urgent questions. Please call the main number to the clinic Dept: [phone] and follow the prompts.   For any non-urgent questions, you may also contact your provider using MyChart. We now offer e-Visits for anyone 14 and older to request care online for non-urgent symptoms. For details visit [email or web address].   Also download the MyChart app! Go to the app store, search MyChart, open the app, select [place], and log in with your MyChart username and password.

## 2024-10-04 ENCOUNTER — Inpatient Hospital Stay

## 2024-10-04 ENCOUNTER — Encounter: Payer: Self-pay | Admitting: Internal Medicine

## 2024-10-04 ENCOUNTER — Other Ambulatory Visit: Payer: Self-pay

## 2024-10-04 VITALS — BP 104/48 | HR 91 | Temp 98.2°F | Resp 18

## 2024-10-04 DIAGNOSIS — D469 Myelodysplastic syndrome, unspecified: Secondary | ICD-10-CM

## 2024-10-04 MED ORDER — ONDANSETRON HCL 8 MG PO TABS
8.0000 mg | ORAL_TABLET | Freq: Once | ORAL | Status: AC
  Administered 2024-10-04: 8 mg via ORAL
  Filled 2024-10-04: qty 1

## 2024-10-04 MED ORDER — AZACITIDINE CHEMO SQ INJECTION
75.0000 mg/m2 | Freq: Once | INTRAMUSCULAR | Status: AC
  Administered 2024-10-04: 162.5 mg via SUBCUTANEOUS
  Filled 2024-10-04: qty 6.5

## 2024-10-04 NOTE — Patient Instructions (Signed)
 [facility] - A DEPT OF [facility]  Discharge Instructions: Thank you for choosing [facility] to provide your oncology and hematology care.   If you have a lab appointment with the Cancer Center, please go directly to the Cancer Center and check in at the registration area.   Wear comfortable clothing and clothing appropriate for easy access to any Portacath or PICC line.   We strive to give you quality time with your provider. You may need to reschedule your appointment if you arrive late (15 or more minutes).  Arriving late affects you and other patients whose appointments are after yours.  Also, if you miss three or more appointments without notifying the office, you may be dismissed from the clinic at the provider's discretion.      For prescription refill requests, have your pharmacy contact our office and allow 72 hours for refills to be completed.    Today you received the following chemotherapy and/or immunotherapy agents: Vidaza      To help prevent nausea and vomiting after your treatment, we encourage you to take your nausea medication as directed.  BELOW ARE SYMPTOMS THAT SHOULD BE REPORTED IMMEDIATELY: *FEVER GREATER THAN 100.4 F (38 C) OR HIGHER *CHILLS OR SWEATING *NAUSEA AND VOMITING THAT IS NOT CONTROLLED WITH YOUR NAUSEA MEDICATION *UNUSUAL SHORTNESS OF BREATH *UNUSUAL BRUISING OR BLEEDING *URINARY PROBLEMS (pain or burning when urinating, or frequent urination) *BOWEL PROBLEMS (unusual diarrhea, constipation, pain near the anus) TENDERNESS IN MOUTH AND THROAT WITH OR WITHOUT PRESENCE OF ULCERS (sore throat, sores in mouth, or a toothache) UNUSUAL RASH, SWELLING OR PAIN  UNUSUAL VAGINAL DISCHARGE OR ITCHING   Items with * indicate a potential emergency and should be followed up as soon as possible or go to the Emergency Department if any problems should occur.  Please show the CHEMOTHERAPY ALERT CARD or IMMUNOTHERAPY  ALERT CARD at check-in to the Emergency Department and triage nurse.  Should you have questions after your visit or need to cancel or reschedule your appointment, please contact [facility] - A DEPT OF [facility]  Dept: [phone]  and follow the prompts.  Office hours are 8:00 a.m. to 4:30 p.m. Monday - Friday. Please note that voicemails left after 4:00 p.m. may not be returned until the following business day.  We are closed weekends and major holidays. You have access to a nurse at all times for urgent questions. Please call the main number to the clinic Dept: [phone] and follow the prompts.   For any non-urgent questions, you may also contact your provider using MyChart. We now offer e-Visits for anyone 5 and older to request care online for non-urgent symptoms. For details visit [email or web address].   Also download the MyChart app! Go to the app store, search "MyChart", open the app, select [facility], and log in with your MyChart username and password.

## 2024-10-06 ENCOUNTER — Other Ambulatory Visit: Payer: Self-pay

## 2024-10-06 ENCOUNTER — Emergency Department (HOSPITAL_COMMUNITY)

## 2024-10-06 ENCOUNTER — Encounter (HOSPITAL_COMMUNITY): Payer: Self-pay

## 2024-10-06 ENCOUNTER — Inpatient Hospital Stay (HOSPITAL_COMMUNITY)
Admission: EM | Admit: 2024-10-06 | Discharge: 2024-10-09 | DRG: 091 | Disposition: A | Discharge status: Skilled Nursing Facility | Source: Skilled Nursing Facility | Attending: Emergency Medicine | Admitting: Emergency Medicine

## 2024-10-06 DIAGNOSIS — I10 Essential (primary) hypertension: Secondary | ICD-10-CM | POA: Diagnosis present

## 2024-10-06 DIAGNOSIS — N390 Urinary tract infection, site not specified: Secondary | ICD-10-CM | POA: Diagnosis present

## 2024-10-06 DIAGNOSIS — M7501 Adhesive capsulitis of right shoulder: Secondary | ICD-10-CM | POA: Diagnosis present

## 2024-10-06 DIAGNOSIS — E1169 Type 2 diabetes mellitus with other specified complication: Secondary | ICD-10-CM | POA: Diagnosis present

## 2024-10-06 DIAGNOSIS — Z87891 Personal history of nicotine dependence: Secondary | ICD-10-CM | POA: Diagnosis not present

## 2024-10-06 DIAGNOSIS — Z8249 Family history of ischemic heart disease and other diseases of the circulatory system: Secondary | ICD-10-CM | POA: Diagnosis not present

## 2024-10-06 DIAGNOSIS — J449 Chronic obstructive pulmonary disease, unspecified: Secondary | ICD-10-CM | POA: Diagnosis present

## 2024-10-06 DIAGNOSIS — N400 Enlarged prostate without lower urinary tract symptoms: Secondary | ICD-10-CM | POA: Diagnosis present

## 2024-10-06 DIAGNOSIS — G4733 Obstructive sleep apnea (adult) (pediatric): Secondary | ICD-10-CM | POA: Diagnosis present

## 2024-10-06 DIAGNOSIS — Z833 Family history of diabetes mellitus: Secondary | ICD-10-CM

## 2024-10-06 DIAGNOSIS — Z794 Long term (current) use of insulin: Secondary | ICD-10-CM

## 2024-10-06 DIAGNOSIS — F32A Depression, unspecified: Secondary | ICD-10-CM | POA: Diagnosis present

## 2024-10-06 DIAGNOSIS — R4182 Altered mental status, unspecified: Secondary | ICD-10-CM | POA: Diagnosis not present

## 2024-10-06 DIAGNOSIS — I350 Nonrheumatic aortic (valve) stenosis: Secondary | ICD-10-CM | POA: Diagnosis present

## 2024-10-06 DIAGNOSIS — Z66 Do not resuscitate: Secondary | ICD-10-CM | POA: Diagnosis present

## 2024-10-06 DIAGNOSIS — E7849 Other hyperlipidemia: Secondary | ICD-10-CM | POA: Diagnosis present

## 2024-10-06 DIAGNOSIS — D696 Thrombocytopenia, unspecified: Secondary | ICD-10-CM | POA: Diagnosis not present

## 2024-10-06 DIAGNOSIS — Z888 Allergy status to other drugs, medicaments and biological substances status: Secondary | ICD-10-CM

## 2024-10-06 DIAGNOSIS — D6481 Anemia due to antineoplastic chemotherapy: Secondary | ICD-10-CM | POA: Diagnosis not present

## 2024-10-06 DIAGNOSIS — D61810 Antineoplastic chemotherapy induced pancytopenia: Secondary | ICD-10-CM | POA: Diagnosis present

## 2024-10-06 DIAGNOSIS — Z79899 Other long term (current) drug therapy: Secondary | ICD-10-CM

## 2024-10-06 DIAGNOSIS — G4700 Insomnia, unspecified: Secondary | ICD-10-CM | POA: Diagnosis present

## 2024-10-06 DIAGNOSIS — Z1152 Encounter for screening for COVID-19: Secondary | ICD-10-CM | POA: Diagnosis not present

## 2024-10-06 DIAGNOSIS — T451X5A Adverse effect of antineoplastic and immunosuppressive drugs, initial encounter: Secondary | ICD-10-CM | POA: Diagnosis present

## 2024-10-06 DIAGNOSIS — Z87440 Personal history of urinary (tract) infections: Secondary | ICD-10-CM

## 2024-10-06 DIAGNOSIS — G894 Chronic pain syndrome: Secondary | ICD-10-CM | POA: Diagnosis present

## 2024-10-06 DIAGNOSIS — R011 Cardiac murmur, unspecified: Secondary | ICD-10-CM | POA: Diagnosis not present

## 2024-10-06 DIAGNOSIS — Z823 Family history of stroke: Secondary | ICD-10-CM

## 2024-10-06 DIAGNOSIS — F419 Anxiety disorder, unspecified: Secondary | ICD-10-CM | POA: Diagnosis present

## 2024-10-06 DIAGNOSIS — M5410 Radiculopathy, site unspecified: Secondary | ICD-10-CM | POA: Diagnosis present

## 2024-10-06 DIAGNOSIS — I2510 Atherosclerotic heart disease of native coronary artery without angina pectoris: Secondary | ICD-10-CM | POA: Diagnosis present

## 2024-10-06 DIAGNOSIS — D469 Myelodysplastic syndrome, unspecified: Secondary | ICD-10-CM | POA: Diagnosis present

## 2024-10-06 DIAGNOSIS — G928 Other toxic encephalopathy: Principal | ICD-10-CM | POA: Diagnosis present

## 2024-10-06 DIAGNOSIS — E119 Type 2 diabetes mellitus without complications: Secondary | ICD-10-CM | POA: Diagnosis not present

## 2024-10-06 DIAGNOSIS — Z885 Allergy status to narcotic agent status: Secondary | ICD-10-CM

## 2024-10-06 DIAGNOSIS — Z91041 Radiographic dye allergy status: Secondary | ICD-10-CM

## 2024-10-06 DIAGNOSIS — R410 Disorientation, unspecified: Secondary | ICD-10-CM | POA: Diagnosis not present

## 2024-10-06 DIAGNOSIS — Z881 Allergy status to other antibiotic agents status: Secondary | ICD-10-CM

## 2024-10-06 DIAGNOSIS — E782 Mixed hyperlipidemia: Secondary | ICD-10-CM | POA: Diagnosis not present

## 2024-10-06 LAB — URINALYSIS, W/ REFLEX TO CULTURE (INFECTION SUSPECTED)
Bacteria, UA: NONE SEEN
Bilirubin Urine: NEGATIVE
Glucose, UA: NEGATIVE mg/dL
Hgb urine dipstick: NEGATIVE
Ketones, ur: NEGATIVE mg/dL
Nitrite: NEGATIVE
Protein, ur: NEGATIVE mg/dL
Specific Gravity, Urine: 1.015 (ref 1.005–1.030)
pH: 5 (ref 5.0–8.0)

## 2024-10-06 LAB — RESP PANEL BY RT-PCR (RSV, FLU A&B, COVID)  RVPGX2
Influenza A by PCR: NEGATIVE
Influenza B by PCR: NEGATIVE
Resp Syncytial Virus by PCR: NEGATIVE
SARS Coronavirus 2 by RT PCR: NEGATIVE

## 2024-10-06 LAB — BASIC METABOLIC PANEL WITH GFR
Anion gap: 8 (ref 5–15)
BUN: 24 mg/dL — ABNORMAL HIGH (ref 8–23)
CO2: 31 mmol/L (ref 22–32)
Calcium: 9.1 mg/dL (ref 8.9–10.3)
Chloride: 98 mmol/L (ref 98–111)
Creatinine, Ser: 1.03 mg/dL (ref 0.61–1.24)
GFR, Estimated: >60 mL/min
Glucose, Bld: 108 mg/dL — ABNORMAL HIGH (ref 70–99)
Potassium: 4.4 mmol/L (ref 3.5–5.1)
Sodium: 136 mmol/L (ref 135–145)

## 2024-10-06 LAB — CBC WITH DIFFERENTIAL/PLATELET
Abs Immature Granulocytes: 0.02 K/uL (ref 0.00–0.07)
Basophils Absolute: 0 K/uL (ref 0.0–0.1)
Basophils Relative: 0 %
Eosinophils Absolute: 0 K/uL (ref 0.0–0.5)
Eosinophils Relative: 0 %
HCT: 23.7 % — ABNORMAL LOW (ref 39.0–52.0)
Hemoglobin: 7.4 g/dL — ABNORMAL LOW (ref 13.0–17.0)
Immature Granulocytes: 1 %
Lymphocytes Relative: 56 %
Lymphs Abs: 1.4 K/uL (ref 0.7–4.0)
MCH: 28.5 pg (ref 26.0–34.0)
MCHC: 31.2 g/dL (ref 30.0–36.0)
MCV: 91.2 fL (ref 80.0–100.0)
Monocytes Absolute: 0.1 K/uL (ref 0.1–1.0)
Monocytes Relative: 3 %
Neutro Abs: 1 K/uL — ABNORMAL LOW (ref 1.7–7.7)
Neutrophils Relative %: 40 %
Platelets: 120 K/uL — ABNORMAL LOW (ref 150–400)
RBC: 2.6 MIL/uL — ABNORMAL LOW (ref 4.22–5.81)
RDW: 15.2 % (ref 11.5–15.5)
WBC: 2.6 K/uL — ABNORMAL LOW (ref 4.0–10.5)
nRBC: 0.8 % — ABNORMAL HIGH (ref 0.0–0.2)

## 2024-10-06 MED ORDER — DULOXETINE HCL 60 MG PO CPEP
60.0000 mg | ORAL_CAPSULE | Freq: Every day | ORAL | Status: DC
  Administered 2024-10-07 – 2024-10-09 (×3): 60 mg via ORAL
  Filled 2024-10-06 (×3): qty 1

## 2024-10-06 MED ORDER — LORATADINE 10 MG PO TABS
10.0000 mg | ORAL_TABLET | Freq: Every day | ORAL | Status: DC
  Administered 2024-10-07 – 2024-10-09 (×3): 10 mg via ORAL
  Filled 2024-10-06 (×3): qty 1

## 2024-10-06 MED ORDER — CRANBERRY 450 MG PO TABS: 450.0000 mg | ORAL_TABLET | Freq: Every morning | ORAL | Status: DC

## 2024-10-06 MED ORDER — SODIUM CHLORIDE 0.9 % IV SOLN: 2.0000 g | INTRAVENOUS | Status: DC

## 2024-10-06 MED ORDER — LORAZEPAM 0.5 MG PO TABS
0.5000 mg | ORAL_TABLET | Freq: Two times a day (BID) | ORAL | Status: DC
  Administered 2024-10-07 – 2024-10-09 (×6): 0.5 mg via ORAL
  Filled 2024-10-06 (×6): qty 1

## 2024-10-06 MED ORDER — INSULIN ASPART 100 UNIT/ML IJ SOLN
0.0000 [IU] | Freq: Three times a day (TID) | INTRAMUSCULAR | Status: DC
  Administered 2024-10-07: 3 [IU] via SUBCUTANEOUS
  Administered 2024-10-08: 4 [IU] via SUBCUTANEOUS
  Administered 2024-10-09: 7 [IU] via SUBCUTANEOUS
  Filled 2024-10-06: qty 4
  Filled 2024-10-06: qty 3
  Filled 2024-10-06: qty 7

## 2024-10-06 MED ORDER — LACTATED RINGERS IV SOLN: INTRAVENOUS | Status: AC

## 2024-10-06 MED ORDER — FOLIC ACID 1 MG PO TABS
500.0000 ug | ORAL_TABLET | Freq: Every day | ORAL | Status: DC
  Administered 2024-10-07 – 2024-10-09 (×3): 0.5 mg via ORAL
  Filled 2024-10-06 (×3): qty 1

## 2024-10-06 MED ORDER — MORPHINE SULFATE 15 MG PO TABS
15.0000 mg | ORAL_TABLET | Freq: Three times a day (TID) | ORAL | Status: DC | PRN
  Administered 2024-10-07 – 2024-10-09 (×4): 15 mg via ORAL
  Filled 2024-10-06 (×4): qty 1

## 2024-10-06 MED ORDER — FERROUS SULFATE 325 (65 FE) MG PO TABS
325.0000 mg | ORAL_TABLET | Freq: Every day | ORAL | Status: DC
  Administered 2024-10-07 – 2024-10-09 (×3): 325 mg via ORAL
  Filled 2024-10-06 (×3): qty 1

## 2024-10-06 MED ORDER — PANTOPRAZOLE SODIUM 40 MG PO TBEC
40.0000 mg | DELAYED_RELEASE_TABLET | Freq: Every day | ORAL | Status: DC
  Administered 2024-10-07 – 2024-10-09 (×3): 40 mg via ORAL
  Filled 2024-10-06 (×3): qty 1

## 2024-10-06 MED ORDER — DOCUSATE SODIUM 100 MG PO CAPS
100.0000 mg | ORAL_CAPSULE | Freq: Two times a day (BID) | ORAL | Status: DC
  Administered 2024-10-07 – 2024-10-09 (×6): 100 mg via ORAL
  Filled 2024-10-06 (×6): qty 1

## 2024-10-06 MED ORDER — PROCHLORPERAZINE MALEATE 10 MG PO TABS: 10.0000 mg | ORAL_TABLET | Freq: Four times a day (QID) | ORAL | Status: DC | PRN

## 2024-10-06 MED ORDER — SODIUM CHLORIDE 0.9 % IV SOLN
1.0000 g | Freq: Once | INTRAVENOUS | Status: AC
  Administered 2024-10-06: 1 g via INTRAVENOUS
  Filled 2024-10-06: qty 5

## 2024-10-06 MED ORDER — SODIUM CHLORIDE 0.9 % IV SOLN
1.0000 g | Freq: Three times a day (TID) | INTRAVENOUS | Status: DC
  Administered 2024-10-07 – 2024-10-08 (×4): 1 g via INTRAVENOUS
  Filled 2024-10-06 (×5): qty 5

## 2024-10-06 MED ORDER — ENOXAPARIN SODIUM 40 MG/0.4ML IJ SOSY
40.0000 mg | PREFILLED_SYRINGE | INTRAMUSCULAR | Status: DC
  Administered 2024-10-07 – 2024-10-09 (×3): 40 mg via SUBCUTANEOUS
  Filled 2024-10-06 (×3): qty 0.4

## 2024-10-06 MED ORDER — VITAMIN B-12 1000 MCG PO TABS
5000.0000 ug | ORAL_TABLET | Freq: Every morning | ORAL | Status: DC
  Administered 2024-10-07 – 2024-10-09 (×3): 5000 ug via ORAL
  Filled 2024-10-06 (×3): qty 5

## 2024-10-06 MED ORDER — METOPROLOL TARTRATE 5 MG/5ML IV SOLN: 5.0000 mg | INTRAVENOUS | Status: DC | PRN

## 2024-10-06 MED ORDER — VITAMIN C 500 MG PO TABS
500.0000 mg | ORAL_TABLET | Freq: Every morning | ORAL | Status: DC
  Administered 2024-10-07 – 2024-10-09 (×3): 500 mg via ORAL
  Filled 2024-10-06 (×3): qty 1

## 2024-10-06 MED ORDER — MELATONIN 3 MG PO TABS
3.0000 mg | ORAL_TABLET | Freq: Every day | ORAL | Status: DC
  Administered 2024-10-07 – 2024-10-08 (×3): 3 mg via ORAL
  Filled 2024-10-06 (×3): qty 1

## 2024-10-06 MED ORDER — ONDANSETRON HCL 4 MG PO TABS
8.0000 mg | ORAL_TABLET | Freq: Three times a day (TID) | ORAL | Status: DC | PRN
Start: 2024-10-06 — End: 2024-10-09

## 2024-10-06 MED ORDER — TAMSULOSIN HCL 0.4 MG PO CAPS
0.4000 mg | ORAL_CAPSULE | Freq: Every morning | ORAL | Status: DC
  Administered 2024-10-07 – 2024-10-09 (×3): 0.4 mg via ORAL
  Filled 2024-10-06 (×3): qty 1

## 2024-10-06 MED ORDER — ALBUTEROL SULFATE (2.5 MG/3ML) 0.083% IN NEBU: 3.0000 mL | INHALATION_SOLUTION | RESPIRATORY_TRACT | Status: DC | PRN

## 2024-10-06 MED ORDER — THIAMINE MONONITRATE 100 MG PO TABS
100.0000 mg | ORAL_TABLET | Freq: Every day | ORAL | Status: DC
  Administered 2024-10-07 – 2024-10-09 (×3): 100 mg via ORAL
  Filled 2024-10-06 (×5): qty 1

## 2024-10-06 MED ORDER — OLANZAPINE 10 MG IM SOLR
5.0000 mg | Freq: Once | INTRAMUSCULAR | Status: AC
  Administered 2024-10-06: 5 mg via INTRAVENOUS
  Filled 2024-10-06: qty 10

## 2024-10-06 MED ORDER — ACETAMINOPHEN 500 MG PO TABS
1000.0000 mg | ORAL_TABLET | Freq: Three times a day (TID) | ORAL | Status: DC | PRN
  Administered 2024-10-07 – 2024-10-09 (×4): 1000 mg via ORAL
  Filled 2024-10-06 (×5): qty 2

## 2024-10-06 MED ORDER — CYCLOBENZAPRINE HCL 10 MG PO TABS
10.0000 mg | ORAL_TABLET | Freq: Three times a day (TID) | ORAL | Status: DC | PRN
  Administered 2024-10-09: 10 mg via ORAL
  Filled 2024-10-06 (×2): qty 1

## 2024-10-06 MED ORDER — LINACLOTIDE 145 MCG PO CAPS
145.0000 ug | ORAL_CAPSULE | Freq: Every day | ORAL | Status: DC
  Administered 2024-10-07 – 2024-10-09 (×3): 145 ug via ORAL
  Filled 2024-10-06 (×3): qty 1

## 2024-10-06 MED ORDER — POLYETHYLENE GLYCOL 3350 17 G PO PACK
17.0000 g | PACK | Freq: Every day | ORAL | Status: DC
  Administered 2024-10-07 – 2024-10-08 (×2): 17 g via ORAL
  Filled 2024-10-06 (×3): qty 1

## 2024-10-06 MED ORDER — TRAZODONE HCL 50 MG PO TABS
50.0000 mg | ORAL_TABLET | Freq: Every day | ORAL | Status: DC
  Administered 2024-10-07 (×2): 50 mg via ORAL
  Filled 2024-10-06 (×2): qty 1

## 2024-10-06 MED ORDER — GUAIFENESIN 100 MG/5ML PO LIQD: 10.0000 mL | ORAL | Status: DC | PRN

## 2024-10-06 MED ORDER — STERILE WATER FOR INJECTION IJ SOLN
INTRAMUSCULAR | Status: AC
  Administered 2024-10-06: 2.1 mL
  Filled 2024-10-06: qty 10

## 2024-10-06 MED ORDER — ATORVASTATIN CALCIUM 20 MG PO TABS
80.0000 mg | ORAL_TABLET | Freq: Every day | ORAL | Status: DC
  Administered 2024-10-07 – 2024-10-08 (×3): 80 mg via ORAL
  Filled 2024-10-06 (×3): qty 4

## 2024-10-06 MED ORDER — ADULT MULTIVITAMIN W/MINERALS CH
1.0000 | ORAL_TABLET | Freq: Every day | ORAL | Status: DC
  Administered 2024-10-07 – 2024-10-09 (×3): 1 via ORAL
  Filled 2024-10-06 (×3): qty 1

## 2024-10-06 MED ORDER — FLUTICASONE PROPIONATE 50 MCG/ACT NA SUSP
1.0000 | Freq: Every day | NASAL | Status: DC
  Administered 2024-10-07 – 2024-10-09 (×3): 1 via NASAL
  Filled 2024-10-06: qty 16

## 2024-10-06 MED ORDER — BUSPIRONE HCL 5 MG PO TABS
15.0000 mg | ORAL_TABLET | Freq: Three times a day (TID) | ORAL | Status: DC
  Administered 2024-10-07 – 2024-10-09 (×8): 15 mg via ORAL
  Filled 2024-10-06 (×8): qty 3

## 2024-10-06 MED ORDER — SODIUM CHLORIDE 0.9 % IV BOLUS
1000.0000 mL | Freq: Once | INTRAVENOUS | Status: AC
  Administered 2024-10-06: 1000 mL via INTRAVENOUS

## 2024-10-06 MED ORDER — PREGABALIN 75 MG PO CAPS
75.0000 mg | ORAL_CAPSULE | Freq: Two times a day (BID) | ORAL | Status: DC
  Administered 2024-10-07 – 2024-10-09 (×6): 75 mg via ORAL
  Filled 2024-10-06 (×6): qty 1

## 2024-10-06 NOTE — Assessment & Plan Note (Signed)
 Continue atorvastatin 

## 2024-10-06 NOTE — Assessment & Plan Note (Signed)
 Check A1c Sliding scale insulin 

## 2024-10-06 NOTE — ED Notes (Signed)
 The patient has been yelling continually since arriving here Help me, help me, Doctor help me. Get me out of here. Oh my God, Oh my God. He is confused, saying that he wants to walk out of here, afraid he is being sent to jail, states he has a twin brother who was shot and killed.

## 2024-10-06 NOTE — Assessment & Plan Note (Signed)
 Also related to his myelodysplastic disease.  Consider blood transfusion

## 2024-10-06 NOTE — ED Triage Notes (Addendum)
 Pt arrive via [facility] from [facility] for confusion. Pt is aaox4. Hx cancer. Vitals 132/64 92hr 98ra 161cbg

## 2024-10-06 NOTE — Assessment & Plan Note (Signed)
 Continue Flomax .

## 2024-10-06 NOTE — Assessment & Plan Note (Signed)
 Check urine culture On Azactam

## 2024-10-06 NOTE — Assessment & Plan Note (Signed)
 Question related to UTI versus polypharmacy Patient is on a lot of medications that might acutely alter his mental status. Will treat for UTI and consider following up with PCP

## 2024-10-06 NOTE — Assessment & Plan Note (Signed)
 CPAP if needed

## 2024-10-06 NOTE — Assessment & Plan Note (Signed)
 Patient's BP is well-controlled on no medications at this time Heart healthy diet Lopressor  as needed

## 2024-10-06 NOTE — Assessment & Plan Note (Signed)
 Per oncology hemoglobin is 7.4 today other counts are low

## 2024-10-06 NOTE — Assessment & Plan Note (Signed)
 Remote history with stent placement approximately 10 to 15 years ago.

## 2024-10-06 NOTE — Assessment & Plan Note (Signed)
 Continue BuSpar  Continue Cymbalta  Continue trazodone  Continue Ativan 

## 2024-10-06 NOTE — Assessment & Plan Note (Signed)
 Tylenol  as needed Continue Flexeril Continue Lyrica  Has morphine  as needed

## 2024-10-06 NOTE — ED Provider Notes (Signed)
 [place] EMERGENCY DEPARTMENT AT [facility] Provider Note   CSN: [id number] Arrival date & time: 10/06/24  8086     Patient presents with: Altered Mental Status   Jorge Sherman is a 83 y.o. male.  With a history of type 2 diabetes, myelodysplastic syndrome COPD and BPH who presents to ED for altered mental status.  Patient is resident of skilled nursing facility at [facility] rehabilitation center where the staff was concerned for acute confusion and potential UTI.  Has had similar episodes of altered mental status with UTIs in the past.  Patient himself is able to tell me that he was sent here for evaluation for UTI.  He has no other complaints at this time.  No recent fevers vomiting diarrhea.  Denies abdominal pain chest pain and no recent falls.    Altered Mental Status      Prior to Admission medications   Medication Sig Start Date End Date Taking? Authorizing Provider  acetaminophen  (TYLENOL ) 500 MG tablet Take 2 tablets (1,000 mg total) by mouth every 6 (six) hours as needed for headache, mild pain or fever. Patient taking differently: Take 1,000 mg by mouth every 8 (eight) hours as needed for headache, mild pain (pain score 1-3) or fever. 11/28/20   Patsy Lenis, MD  albuterol  (VENTOLIN  HFA) 108 (90 Base) MCG/ACT inhaler Inhale 2 puffs into the lungs every 4 (four) hours as needed for wheezing or shortness of breath. 11/03/20   Pokhrel, Laxman, MD  ascorbic acid  (VITAMIN C) 500 MG tablet Take 500 mg by mouth every morning.    [provider]  atorvastatin  (LIPITOR ) 80 MG tablet Take 80 mg by mouth at bedtime. 12/14/20   [provider]  busPIRone  (BUSPAR ) 15 MG tablet Take 15 mg by mouth 3 (three) times daily. 07/02/24   [provider]  Cranberry 450 MG TABS Take 450 mg by mouth in the morning.    [provider]  Cyanocobalamin 5000 MCG TBDP Place 5,000 mcg under the tongue every morning.    [provider]   cyclobenzaprine (FLEXERIL) 10 MG tablet Take 10 mg by mouth 3 (three) times daily as needed for muscle spasms.    [provider]  Docusate Sodium  (DSS) 100 MG CAPS Take 100 mg by mouth in the morning and at bedtime.    [provider]  DULoxetine  (CYMBALTA ) 60 MG capsule Take 60 mg by mouth daily. 07/14/24   [provider]  ferrous sulfate  325 (65 FE) MG tablet Take 325 mg by mouth daily.     [provider]  fluticasone (FLONASE) 50 MCG/ACT nasal spray Place 1 spray into the nose daily. 06/04/22   [provider]  folic acid  (FOLVITE ) 400 MCG tablet Take 400 mcg by mouth every morning.    [provider]  guaiFENesin (ROBITUSSIN) 100 MG/5ML liquid Take 10 mLs by mouth every 4 (four) hours as needed for cough.    [provider]  hydroxychloroquine  (PLAQUENIL ) 200 MG tablet Take 200 mg by mouth 2 (two) times daily.    [provider]  Hypromellose (NATURAL BALANCE TEARS OP) Place 2 drops into both eyes in the morning and at bedtime.    [provider]  lidocaine  4 % Place 1 patch onto the skin in the morning. 07/29/24 10/27/24  [provider]  LINZESS  145 MCG CAPS capsule Take 145 mcg by mouth daily. 07/11/24   [provider]  loratadine (CLARITIN) 10 MG tablet Take 10  mg by mouth in the morning.    [provider]  LORazepam  (ATIVAN ) 0.5 MG tablet Take 0.5 mg by mouth 2 (two) times daily. 07/02/24   [provider]  melatonin 3 MG TABS tablet Take 3 mg by mouth at bedtime. 07/29/24 10/27/24  [provider]  morphine  (MSIR) 15 MG tablet Take 15 mg by mouth every 8 (eight) hours as needed for severe pain (pain score 7-10).    [provider]  Multiple Vitamin (MULTIVITAMIN WITH MINERALS) TABS tablet Take 1 tablet by mouth daily. 11/29/20   Patsy Lenis, MD  ondansetron  (ZOFRAN ) 4 MG tablet Take 1 tablet (4 mg total) by mouth every 6 (six) hours as needed for nausea.  07/04/20   Will Almarie MATSU, MD  ondansetron  (ZOFRAN ) 8 MG tablet Take 1 tablet (8 mg total) by mouth every 8 (eight) hours as needed for nausea or vomiting. 08/22/24   [name] [name], MD  pantoprazole  (PROTONIX ) 40 MG tablet Take 1 tablet (40 mg total) by mouth daily. 11/29/20   Patsy Lenis, MD  polyethylene glycol (MIRALAX  / GLYCOLAX ) 17 g packet Take 17 g by mouth daily as needed for mild constipation. Patient taking differently: Take 17 g by mouth daily. 11/03/20   Pokhrel, Laxman, MD  pregabalin  (LYRICA ) 75 MG capsule Take 75 mg by mouth 2 (two) times daily. 07/02/24   [provider]  prochlorperazine  (COMPAZINE ) 10 MG tablet Take 1 tablet (10 mg total) by mouth every 6 (six) hours as needed for nausea or vomiting. 08/22/24   [name] [name], MD  SUMAtriptan  (IMITREX ) 50 MG tablet Take 50 mg by mouth every 2 (two) hours as needed for migraine or headache. 07/01/24   [provider]  tamsulosin  (FLOMAX ) 0.4 MG CAPS capsule Take 0.4 mg by mouth every morning.    [provider]  thiamine  100 MG tablet Take 1 tablet (100 mg total) by mouth daily. 02/25/21   Cheryle Page, MD  traZODone  (DESYREL ) 50 MG tablet Take 50 mg by mouth at bedtime. 08/27/21 10/27/24  [provider]    Allergies: Cefepime, Iodinated contrast media, Iohexol , Tositumomab, Fentanyl , and Iodine    Review of Systems  Updated Vital Signs BP (!) 114/92 (BP Location: Left Arm)   Pulse 93   Temp 98 F (36.7 C)   Resp 20   Ht 5' 10 (1.778 m)   Wt 91.6 kg   SpO2 94%   BMI 28.98 kg/m   Physical Exam Vitals and nursing note reviewed.  HENT:     Head: Normocephalic and atraumatic.  Eyes:     Pupils: Pupils are equal, round, and reactive to light.  Cardiovascular:     Rate and Rhythm: Normal rate and regular rhythm.  Pulmonary:     Effort: Pulmonary effort is normal.     Breath sounds: Normal breath sounds.  Abdominal:     Palpations: Abdomen is soft.     Tenderness: There is  no abdominal tenderness.  Skin:    General: Skin is warm and dry.     Comments: Hyperpigmentation over bilateral upper and lower extremities  Neurological:     Mental Status: He is alert.     Comments: Oriented to self and place 5 5 motor strength bilateral upper and lower extremities sensation intact to touch throughout  Psychiatric:        Mood and Affect: Mood normal.     (all labs ordered are listed, but only abnormal results are displayed) Labs Reviewed  BASIC  METABOLIC PANEL WITH GFR - Abnormal; Notable for the following components:      Result Value   Glucose, Bld 108 (*)    BUN 24 (*)    All other components within normal limits  CBC WITH DIFFERENTIAL/PLATELET - Abnormal; Notable for the following components:   WBC 2.6 (*)    RBC 2.60 (*)    Hemoglobin 7.4 (*)    HCT 23.7 (*)    Platelets 120 (*)    nRBC 0.8 (*)    Neutro Abs 1.0 (*)    All other components within normal limits  URINALYSIS, W/ REFLEX TO CULTURE (INFECTION SUSPECTED) - Abnormal; Notable for the following components:   Leukocytes,Ua TRACE (*)    All other components within normal limits  RESP PANEL BY RT-PCR (RSV, FLU A&B, COVID)  [id number]  URINE CULTURE    EKG: None  Radiology: DG Chest Portable 1 View Result Date: 10/06/2024 EXAM: 1 VIEW(S) XRAY OF THE CHEST 10/06/2024 08:13:00 PM COMPARISON: None available. CLINICAL HISTORY: ?pna pna FINDINGS: LUNGS AND PLEURA: Lungs are extremely small and full insufflation has diminished since prior examination. No focal pulmonary opacity. No pleural effusion. No pneumothorax. HEART AND MEDIASTINUM: No acute abnormality of the cardiac and mediastinal silhouettes. BONES AND SOFT TISSUES: Osseous structures are age appropriate with advanced degenerative changes noted within the shoulders bilaterally. IMPRESSION: 1. No acute process. 2. Pulmonary hypoinflation Electronically signed by: Dorethia Molt MD 10/06/2024 08:45 PM EST RP Workstation: [id number]      Procedures   Medications Ordered in the ED  aztreonam (AZACTAM) 1 g in sodium chloride  0.9 % 100 mL IVPB (1 g Intravenous New Bag/Given 10/06/24 2237)  OLANZapine (ZYPREXA) injection 5 mg (5 mg Intravenous Given 10/06/24 2228)  sodium chloride  0.9 % bolus 1,000 mL (1,000 mLs Intravenous New Bag/Given 10/06/24 2234)  sterile water (preservative free) injection (2.1 mLs  Given 10/06/24 2232)    Clinical Course as of 10/06/24 2247  Sun Oct 06, 2024  2230 UTI shows white cells trace leukocytes.  Will need to send for culture.  No other infectious causes identified based on chest x-ray viral respiratory swab.  Labs at recent baseline with known myelodysplastic syndrome.  I did speak with the patient's daughter over the phone who states she is significantly altered from baseline.  Will treat empirically for UTI and send for culture.  Zyprexa for acute agitation.  Daughter will reach out to his oncology office in the morning to let him know he has been admitted and they can decide on adjusting his dosing regimen during admission.  He was scheduled for his next chemotherapy administration tomorrow [MP]  2243 Discussed admitting hospitalist accepts patient for admission [MP]    Clinical Course User Index [MP] Pamella Ozell LABOR, DO                                 Medical Decision Making 83 year old male presenting from skilled nursing facility given concern for altered mental status potential UTI.  Afebrile slightly hypertensive here.  No focal neurologic deficits on my exam.  Blood glucose of 160 prior to arrival.  Will obtain infectious workup to evaluate for potential pneumonia etiology such as UTI pneumonia viral respiratory illness unexplained altered mental status.  Amount and/or Complexity of Data Reviewed Labs: ordered. Radiology: ordered.  Risk Prescription drug management.        Final diagnoses:  Altered mental status, unspecified altered mental status type  Lower urinary  tract infectious disease  Myelodysplastic syndrome [facility])    ED Discharge Orders     None          Pamella Ozell LABOR, DO 10/06/24 2248

## 2024-10-06 NOTE — H&P (Signed)
 History and Physical    Patient: Jorge Sherman FMW:[id number] DOB: [birth year] DOA: 10/06/2024 DOS: the patient was seen and examined on 10/06/2024 PCP: [name] LABOR, MD  Patient coming from: Home  Chief Complaint:  Chief Complaint  Patient presents with   Altered Mental Status   HPI: Jorge Sherman is a 83 y.o. male with medical history significant of myelodysplastic disorder on chemo, HTN, T2DM, depression, CAD who was admitted following his last chemo was at [facility] with a UTI.  He had 3 to 4 days of altered mental status at home and his caregivers called the oncologist who recommended he come in to be treated for presumed UTI again.  Patient is noted to have some WBCs on urinalysis, negative respiratory panel, negative chest x-ray, and nonlocalizing symptoms.  He does have a hemoglobin of 7.4.  He is also mildly neutropenic.  Review of Systems: unable to review all systems due to the inability of the patient to answer questions. Past Medical History:  Diagnosis Date   Chronic pain    Depressive disorder, not elsewhere classified    Dizziness and giddiness    Essential and other specified forms of tremor    Headache(784.0)    Leukocytosis, unspecified    Obstructive sleep apnea (adult) (pediatric)    Pain in joint, lower leg    Septic joint of left knee joint (HCC) 04/22/2019   Spinal stenosis, lumbar region, without neurogenic claudication    Thoracic or lumbosacral neuritis or radiculitis, unspecified    Type II or unspecified type diabetes mellitus without mention of complication, not stated as uncontrolled    Unspecified cardiovascular disease    Unspecified essential hypertension    Past Surgical History:  Procedure Laterality Date   BACK SURGERY     back surgey     times 3   GIVENS CAPSULE STUDY N/A 08/09/2024   Procedure: IMAGING PROCEDURE, GI TRACT, INTRALUMINAL, VIA CAPSULE;  Surgeon: Rosalie Kitchens, MD;  Location: [facility] ENDOSCOPY;  Service:  Gastroenterology;  Laterality: N/A;   IR BONE MARROW BIOPSY & ASPIRATION  08/14/2024   IR INJECT/THERA/INC NEEDLE/CATH/PLC EPI/LUMB/SAC W/IMG  11/26/2020   Social History:  reports that he has quit smoking. He has never used smokeless tobacco. He reports that he does not drink alcohol  and does not use drugs.  Allergies  Allergen Reactions   Cefepime Rash    Redness   Iodinated Contrast Media Dermatitis, Hives and Other (See Comments)    A few hives post CT     Desc: A FEW HIVES POST CT, IODINE   Iohexol  Hives and Other (See Comments)    A few hives post CT   Tositumomab    Fentanyl  Itching, Nausea And Vomiting and Other (See Comments)    Patient states that it happened over 30 yrs ago   Iodine Hives and Rash    Family History  Problem Relation Age of Onset   Cancer Father    Heart disease Father    Stroke Mother    Diabetes Mother     Prior to Admission medications   Medication Sig Start Date End Date Taking? Authorizing Provider  acetaminophen  (TYLENOL ) 500 MG tablet Take 2 tablets (1,000 mg total) by mouth every 6 (six) hours as needed for headache, mild pain or fever. Patient taking differently: Take 1,000 mg by mouth every 8 (eight) hours as needed for headache, mild pain (pain score 1-3) or fever. 11/28/20   Patsy Lenis, MD  albuterol  (VENTOLIN  HFA) 108 (  90 Base) MCG/ACT inhaler Inhale 2 puffs into the lungs every 4 (four) hours as needed for wheezing or shortness of breath. 11/03/20   Pokhrel, Laxman, MD  ascorbic acid  (VITAMIN C) 500 MG tablet Take 500 mg by mouth every morning.    [provider]  atorvastatin  (LIPITOR ) 80 MG tablet Take 80 mg by mouth at bedtime. 12/14/20   [provider]  busPIRone  (BUSPAR ) 15 MG tablet Take 15 mg by mouth 3 (three) times daily. 07/02/24   [provider]  Cranberry 450 MG TABS Take 450 mg by mouth in the morning.    [provider]  Cyanocobalamin 5000 MCG TBDP Place 5,000 mcg under the tongue every  morning.    [provider]  cyclobenzaprine (FLEXERIL) 10 MG tablet Take 10 mg by mouth 3 (three) times daily as needed for muscle spasms.    [provider]  Docusate Sodium  (DSS) 100 MG CAPS Take 100 mg by mouth in the morning and at bedtime.    [provider]  DULoxetine  (CYMBALTA ) 60 MG capsule Take 60 mg by mouth daily. 07/14/24   [provider]  ferrous sulfate  325 (65 FE) MG tablet Take 325 mg by mouth daily.     [provider]  fluticasone (FLONASE) 50 MCG/ACT nasal spray Place 1 spray into the nose daily. 06/04/22   [provider]  folic acid  (FOLVITE ) 400 MCG tablet Take 400 mcg by mouth every morning.    [provider]  guaiFENesin (ROBITUSSIN) 100 MG/5ML liquid Take 10 mLs by mouth every 4 (four) hours as needed for cough.    [provider]  hydroxychloroquine  (PLAQUENIL ) 200 MG tablet Take 200 mg by mouth 2 (two) times daily.    [provider]  Hypromellose (NATURAL BALANCE TEARS OP) Place 2 drops into both eyes in the morning and at bedtime.    [provider]  lidocaine  4 % Place 1 patch onto the skin in the morning. 07/29/24 10/27/24  [provider]  LINZESS  145 MCG CAPS capsule Take 145 mcg by mouth daily. 07/11/24   [provider]  loratadine (CLARITIN) 10 MG tablet Take 10 mg by mouth in the morning.    [provider]  LORazepam  (ATIVAN ) 0.5 MG tablet Take 0.5 mg by mouth 2 (two) times daily. 07/02/24   [provider]  melatonin 3 MG TABS tablet Take 3 mg by mouth at bedtime. 07/29/24 10/27/24  [provider]  morphine  (MSIR) 15 MG tablet Take 15 mg by mouth every 8 (eight) hours as needed for severe pain (pain score 7-10).    [provider]  Multiple Vitamin (MULTIVITAMIN WITH MINERALS) TABS tablet Take 1 tablet by mouth daily. 11/29/20   Patsy Lenis, MD  ondansetron  (ZOFRAN ) 4 MG tablet Take 1 tablet (4 mg total) by mouth every 6  (six) hours as needed for nausea. 07/04/20   Will Almarie MATSU, MD  ondansetron  (ZOFRAN ) 8 MG tablet Take 1 tablet (8 mg total) by mouth every 8 (eight) hours as needed for nausea or vomiting. 08/22/24   [name] [name], MD  pantoprazole  (PROTONIX ) 40 MG tablet Take 1 tablet (40 mg total) by mouth daily. 11/29/20   Patsy Lenis, MD  polyethylene glycol (MIRALAX  / GLYCOLAX ) 17 g packet Take 17 g by mouth daily as needed for mild constipation. Patient taking differently: Take 17 g by mouth daily. 11/03/20   Pokhrel, Laxman, MD  pregabalin  (LYRICA ) 75 MG capsule Take 75 mg by mouth  2 (two) times daily. 07/02/24   [provider]  prochlorperazine  (COMPAZINE ) 10 MG tablet Take 1 tablet (10 mg total) by mouth every 6 (six) hours as needed for nausea or vomiting. 08/22/24   [name] [name], MD  SUMAtriptan  (IMITREX ) 50 MG tablet Take 50 mg by mouth every 2 (two) hours as needed for migraine or headache. 07/01/24   [provider]  tamsulosin  (FLOMAX ) 0.4 MG CAPS capsule Take 0.4 mg by mouth every morning.    [provider]  thiamine  100 MG tablet Take 1 tablet (100 mg total) by mouth daily. 02/25/21   Cheryle Page, MD  traZODone  (DESYREL ) 50 MG tablet Take 50 mg by mouth at bedtime. 08/27/21 10/27/24  [provider]    Physical Exam: Vitals:   10/06/24 1921 10/06/24 1941 10/06/24 1945 10/06/24 2241  BP: (!) 140/86  105/81 (!) 114/92  Pulse: 94  91 93  Resp: 20  (!) 25 20  Temp: 98 F (36.7 C)     SpO2: 97%   94%  Weight:  91.6 kg    Height:  5' 10 (1.778 m)     Physical Examination: General appearance - chronically ill appearing Chest - clear to auscultation, no wheezes, rales or rhonchi, symmetric air entry Heart - normal rate and regular rhythm, systolic murmur 3/6 at 2nd left intercostal space Extremities - pedal edema 1 + Skin -senile purpura and ecchymoses noted throughout upper and lower extremities  Data Reviewed: Results for orders placed or  performed during the hospital encounter of 10/06/24 (from the past 24 hours)  Urinalysis, w/ Reflex to Culture (Infection Suspected) -Urine, Clean Catch     Status: Abnormal   Collection Time: 10/06/24  8:28 PM  Result Value Ref Range   Specimen Source URINE, CLEAN CATCH    Color, Urine YELLOW YELLOW   APPearance CLEAR CLEAR   Specific Gravity, Urine 1.015 1.005 - 1.030   pH 5.0 5.0 - 8.0   Glucose, UA NEGATIVE NEGATIVE mg/dL   Hgb urine dipstick NEGATIVE NEGATIVE   Bilirubin Urine NEGATIVE NEGATIVE   Ketones, ur NEGATIVE NEGATIVE mg/dL   Protein, ur NEGATIVE NEGATIVE mg/dL   Nitrite NEGATIVE NEGATIVE   Leukocytes,Ua TRACE (A) NEGATIVE   RBC / HPF 0-5 0 - 5 RBC/hpf   WBC, UA 11-20 0 - 5 WBC/hpf   Bacteria, UA NONE SEEN NONE SEEN   Squamous Epithelial / HPF 0-5 0 - 5 /HPF   Mucus PRESENT   Basic metabolic panel     Status: Abnormal   Collection Time: 10/06/24  8:37 PM  Result Value Ref Range   Sodium 136 135 - 145 mmol/L   Potassium 4.4 3.5 - 5.1 mmol/L   Chloride 98 98 - 111 mmol/L   CO2 31 22 - 32 mmol/L   Glucose, Bld 108 (H) 70 - 99 mg/dL   BUN 24 (H) 8 - 23 mg/dL   Creatinine, Ser 8.96 0.61 - 1.24 mg/dL   Calcium  9.1 8.9 - 10.3 mg/dL   GFR, Estimated >39 >39 mL/min   Anion gap 8 5 - 15  CBC with Differential     Status: Abnormal   Collection Time: 10/06/24  8:37 PM  Result Value Ref Range   WBC 2.6 (L) 4.0 - 10.5 K/uL   RBC 2.60 (L) 4.22 - 5.81 MIL/uL   Hemoglobin 7.4 (L) 13.0 - 17.0 g/dL   HCT 76.2 (L) 60.9 - 47.9 %   MCV 91.2 80.0 - 100.0 fL   MCH 28.5  26.0 - 34.0 pg   MCHC 31.2 30.0 - 36.0 g/dL   RDW 84.7 88.4 - 84.4 %   Platelets 120 (L) 150 - 400 K/uL   nRBC 0.8 (H) 0.0 - 0.2 %   Neutrophils Relative % 40 %   Neutro Abs 1.0 (L) 1.7 - 7.7 K/uL   Lymphocytes Relative 56 %   Lymphs Abs 1.4 0.7 - 4.0 K/uL   Monocytes Relative 3 %   Monocytes Absolute 0.1 0.1 - 1.0 K/uL   Eosinophils Relative 0 %   Eosinophils Absolute 0.0 0.0 - 0.5 K/uL   Basophils Relative  0 %   Basophils Absolute 0.0 0.0 - 0.1 K/uL   Immature Granulocytes 1 %   Abs Immature Granulocytes 0.02 0.00 - 0.07 K/uL   Reactive, Benign Lymphocytes PRESENT   Resp panel by RT-PCR (RSV, Flu A&B, Covid) Anterior Nasal Swab     Status: None   Collection Time: 10/06/24  9:09 PM   Specimen: Anterior Nasal Swab  Result Value Ref Range   SARS Coronavirus 2 by RT PCR NEGATIVE NEGATIVE   Influenza A by PCR NEGATIVE NEGATIVE   Influenza B by PCR NEGATIVE NEGATIVE   Resp Syncytial Virus by PCR NEGATIVE NEGATIVE   DG Chest Portable 1 View Result Date: 10/06/2024 EXAM: 1 VIEW(S) XRAY OF THE CHEST 10/06/2024 08:13:00 PM COMPARISON: None available. CLINICAL HISTORY: ?pna pna FINDINGS: LUNGS AND PLEURA: Lungs are extremely small and full insufflation has diminished since prior examination. No focal pulmonary opacity. No pleural effusion. No pneumothorax. HEART AND MEDIASTINUM: No acute abnormality of the cardiac and mediastinal silhouettes. BONES AND SOFT TISSUES: Osseous structures are age appropriate with advanced degenerative changes noted within the shoulders bilaterally. IMPRESSION: 1. No acute process. 2. Pulmonary hypoinflation Electronically signed by: Dorethia Molt MD 10/06/2024 08:45 PM EST RP Workstation: [id number]     Assessment and Plan: * Altered mental status Question related to UTI versus polypharmacy Patient is on a lot of medications that might acutely alter his mental status. Will treat for UTI and consider following up with PCP  Mixed hyperlipidemia due to type 2 diabetes mellitus (HCC) Continue atorvastatin   BPH (benign prostatic hyperplasia) Continue Flomax   UTI (urinary tract infection) Check urine culture On Azactam  Anemia associated with chemotherapy Also related to his myelodysplastic disease.  Consider blood transfusion    Myelodysplastic syndrome [facility]) Per oncology hemoglobin is 7.4 today other counts are low  Coronary artery disease involving native  coronary artery of native heart without angina pectoris Remote history with stent placement approximately 10 to 15 years ago.  Essential hypertension, benign Patient's BP is well-controlled on no medications at this time Heart healthy diet Lopressor  as needed  Chronic pain syndrome Tylenol  as needed Continue Flexeril Continue Lyrica  Has morphine  as needed  Depression Continue BuSpar  Continue Cymbalta  Continue trazodone  Continue Ativan   Obstructive sleep apnea CPAP if needed  Type 2 diabetes mellitus without complication, with long-term current use of insulin  (HCC) Check A1c Sliding scale insulin       Advance Care Planning:   Code Status: Limited: Do not attempt resuscitation (DNR) -DNR-LIMITED -Do Not Intubate/DNI patient with MOST form  Consults: None  Family Communication: Patient at bedside  Severity of Illness: The appropriate patient status for this patient is INPATIENT. Inpatient status is judged to be reasonable and necessary in order to provide the required intensity of service to ensure the patient's safety. The patient's presenting symptoms, physical exam findings, and initial radiographic and laboratory data in the context of  their chronic comorbidities is felt to place them at high risk for further clinical deterioration. Furthermore, it is not anticipated that the patient will be medically stable for discharge from the hospital within 2 midnights of admission.   * I certify that at the point of admission it is my clinical judgment that the patient will require inpatient hospital care spanning beyond 2 midnights from the point of admission due to high intensity of service, high risk for further deterioration and high frequency of surveillance required.*  Author: Glenys GORMAN Birk, MD 10/06/2024 11:41 PM  For on call review [email or web address].

## 2024-10-07 ENCOUNTER — Telehealth: Payer: Self-pay | Admitting: Surgery

## 2024-10-07 ENCOUNTER — Inpatient Hospital Stay

## 2024-10-07 DIAGNOSIS — R410 Disorientation, unspecified: Secondary | ICD-10-CM

## 2024-10-07 DIAGNOSIS — N400 Enlarged prostate without lower urinary tract symptoms: Secondary | ICD-10-CM | POA: Diagnosis not present

## 2024-10-07 DIAGNOSIS — I10 Essential (primary) hypertension: Secondary | ICD-10-CM

## 2024-10-07 DIAGNOSIS — G4733 Obstructive sleep apnea (adult) (pediatric): Secondary | ICD-10-CM

## 2024-10-07 DIAGNOSIS — E119 Type 2 diabetes mellitus without complications: Secondary | ICD-10-CM

## 2024-10-07 DIAGNOSIS — D6481 Anemia due to antineoplastic chemotherapy: Secondary | ICD-10-CM | POA: Diagnosis not present

## 2024-10-07 DIAGNOSIS — I2510 Atherosclerotic heart disease of native coronary artery without angina pectoris: Secondary | ICD-10-CM

## 2024-10-07 DIAGNOSIS — G894 Chronic pain syndrome: Secondary | ICD-10-CM | POA: Diagnosis not present

## 2024-10-07 DIAGNOSIS — Z794 Long term (current) use of insulin: Secondary | ICD-10-CM

## 2024-10-07 LAB — HEMOGLOBIN A1C
Hgb A1c MFr Bld: 5.6 % (ref 4.8–5.6)
Mean Plasma Glucose: 114.02 mg/dL

## 2024-10-07 LAB — GLUCOSE, CAPILLARY
Glucose-Capillary: 109 mg/dL — ABNORMAL HIGH (ref 70–99)
Glucose-Capillary: 117 mg/dL — ABNORMAL HIGH (ref 70–99)
Glucose-Capillary: 134 mg/dL — ABNORMAL HIGH (ref 70–99)
Glucose-Capillary: 93 mg/dL (ref 70–99)

## 2024-10-07 LAB — COMPREHENSIVE METABOLIC PANEL WITH GFR
ALT: 6 U/L (ref 0–44)
AST: 16 U/L (ref 15–41)
Albumin: 3.4 g/dL — ABNORMAL LOW (ref 3.5–5.0)
Alkaline Phosphatase: 62 U/L (ref 38–126)
Anion gap: 8 (ref 5–15)
BUN: 21 mg/dL (ref 8–23)
CO2: 30 mmol/L (ref 22–32)
Calcium: 8.9 mg/dL (ref 8.9–10.3)
Chloride: 100 mmol/L (ref 98–111)
Creatinine, Ser: 0.94 mg/dL (ref 0.61–1.24)
GFR, Estimated: >60 mL/min (ref >60)
Glucose, Bld: 103 mg/dL — ABNORMAL HIGH (ref 70–99)
Potassium: 3.9 mmol/L (ref 3.5–5.1)
Sodium: 138 mmol/L (ref 135–145)
Total Bilirubin: 0.5 mg/dL (ref 0.0–1.2)
Total Protein: 6.2 g/dL — ABNORMAL LOW (ref 6.5–8.1)

## 2024-10-07 LAB — CBC
HCT: 23.7 % — ABNORMAL LOW (ref 39.0–52.0)
Hemoglobin: 7.4 g/dL — ABNORMAL LOW (ref 13.0–17.0)
MCH: 28.9 pg (ref 26.0–34.0)
MCHC: 31.2 g/dL (ref 30.0–36.0)
MCV: 92.6 fL (ref 80.0–100.0)
Platelets: 106 K/uL — ABNORMAL LOW (ref 150–400)
RBC: 2.56 MIL/uL — ABNORMAL LOW (ref 4.22–5.81)
RDW: 15.2 % (ref 11.5–15.5)
WBC: 2.4 K/uL — ABNORMAL LOW (ref 4.0–10.5)
nRBC: 0 % (ref 0.0–0.2)

## 2024-10-07 LAB — TSH: TSH: 1.97 u[IU]/mL (ref 0.350–4.500)

## 2024-10-07 NOTE — Evaluation (Signed)
 Physical Therapy Evaluation Patient Details Name: Jorge Sherman MRN: [id number] DOB: 08-01-1941 Today's Date: 10/07/2024  History of Present Illness  83 yo male presents to therapy following hospitalization secondary to AMS. UA negative and possible polypharmacy and delirium. Pt is LTC at  [facility]. Pt PMH includes but is not limited to: chronic pain, depression, HTN, DM II, CAD, anemia, GI bleed, muscle tension dysphonia, pancytopenia,  tremor, HA, OSA, spinal stenosis with back surgeries, givens capsule study and myelodysplastic disorder currently on chemo.  Clinical Impression     Pt admitted with above diagnosis. Pt currently with functional limitations due to the deficits listed below (see PT Problem List). Pt resting in bed when PT arrived. Pt easily roused and agreeable to therapy intervention. Pt able to provide minimal insight to PLOF and able to relay residing and [facility]. Pt states he eats meals in the dining room and uses a motorized wc. Pt reports L LE pain at rest and with mobility tasks. Pt required increased time cues and HOB elevated for supine to sit, min A for sit to stand  from EOB, min A for SPT bed to [facility] and min A for SPT BSC to recliner, pt noted to have L knee varis deformity. Pt left seated in recliner, all needs in place. Nursing staff assisted PT with hygiene tasks and aware of A x 1 for transfer tasks with use of RW.  Patient will benefit from continued inpatient follow up therapy, <3 hours/day. Pt will benefit from acute skilled PT to increase their independence and safety with mobility to allow discharge.         If plan is discharge home, recommend the following: Two people to help with walking and/or transfers;A lot of help with bathing/dressing/bathroom;Assistance with cooking/housework;Assist for transportation;Supervision due to cognitive status   Can travel by private vehicle   No    Equipment Recommendations None recommended by PT   Recommendations for Other Services       Functional Status Assessment Patient has had a recent decline in their functional status and demonstrates the ability to make significant improvements in function in a reasonable and predictable amount of time.     Precautions / Restrictions Precautions Precautions: Fall Restrictions Weight Bearing Restrictions Per Provider Order: No      Mobility  Bed Mobility Overal bed mobility: Needs Assistance Bed Mobility: Supine to Sit     Supine to sit: Min assist, HOB elevated (pt reports no bed rails at SNF and not used at time of eval)     General bed mobility comments: min cues, increased time and HOB elevated    Transfers Overall transfer level: Needs assistance Equipment used: Rolling walker (2 wheels) Transfers: Sit to/from Stand, Bed to chair/wheelchair/BSC Sit to Stand: Min assist Stand pivot transfers: Min assist         General transfer comment: min A for sit to stand from EOB, SPT bed to [facility] and BSC to recliner    Ambulation/Gait               General Gait Details: NT unclear if pt was ambulatory at baseline, per notes motorized wc primary means of facility mobility and pt reports L LE pain with WB  Stairs            Wheelchair Mobility     Tilt Bed    Modified Rankin (Stroke Patients Only)       Balance Overall balance assessment: Mild deficits observed, not formally tested (pt  denies hx of falls)                                           Pertinent Vitals/Pain Pain Assessment Pain Assessment: Faces Faces Pain Scale: Hurts even more Pain Location: L LE Pain Descriptors / Indicators: Aching, Constant, Discomfort Pain Intervention(s): Limited activity within patient's tolerance, Monitored during session, Repositioned    Home Living Family/patient expects to be discharged to:: Skilled nursing facility Living Arrangements: Other (Comment)                      Prior  Function Prior Level of Function : Needs assist             Mobility Comments: A x 1 for SPT bed <>  motorized wc ADLs Comments: staff assists for bathing and dressing, pt reports is mod I for toileting     Extremity/Trunk Assessment        Lower Extremity Assessment Lower Extremity Assessment: Generalized weakness (L varis knee deformity)    Cervical / Trunk Assessment Cervical / Trunk Assessment: Back Surgery  Communication   Communication Communication: No apparent difficulties    Cognition Arousal: Alert Behavior During Therapy: WFL for tasks assessed/performed   PT - Cognitive impairments: No family/caregiver present to determine baseline, History of cognitive impairments                         Following commands: Impaired Following commands impaired: Follows multi-step commands inconsistently     Cueing Cueing Techniques: Verbal cues, Gestural cues, Tactile cues, Visual cues     General Comments      Exercises     Assessment/Plan    PT Assessment Patient needs continued PT services  PT Problem List Decreased strength;Decreased range of motion;Decreased activity tolerance;Decreased balance;Decreased mobility;Decreased coordination;Decreased cognition;Pain       PT Treatment Interventions DME instruction;Functional mobility training;Therapeutic activities;Therapeutic exercise;Balance training;Neuromuscular re-education;Cognitive remediation;Patient/family education    PT Goals (Current goals can be found in the Care Plan section)  Acute Rehab PT Goals Patient Stated Goal: know what happened and get out of here PT Goal Formulation: With patient Time For Goal Achievement: 10/21/24 Potential to Achieve Goals: Fair    Frequency Min 2X/week     Co-evaluation               AM-PAC PT 6 Clicks Mobility  Outcome Measure Help needed turning from your back to your side while in a flat bed without using bedrails?: None Help needed  moving from lying on your back to sitting on the side of a flat bed without using bedrails?: A Little Help needed moving to and from a bed to a chair (including a wheelchair)?: A Little Help needed standing up from a chair using your arms (e.g., wheelchair or bedside chair)?: A Little Help needed to walk in hospital room?: Total Help needed climbing 3-5 steps with a railing? : Total 6 Click Score: 15    End of Session Equipment Utilized During Treatment: Gait belt Activity Tolerance: Patient limited by pain;Patient limited by fatigue Patient left: in chair;with call bell/phone within reach Nurse Communication: Mobility status PT Visit Diagnosis: Unsteadiness on feet (R26.81);Other abnormalities of gait and mobility (R26.89);Muscle weakness (generalized) (M62.81);Difficulty in walking, not elsewhere classified (R26.2);Pain Pain - Right/Left: Left Pain - part of body: Knee;Hip;Leg    Time: 8382-8356  PT Time Calculation (min) (ACUTE ONLY): 26 min   Charges:   PT Evaluation $PT Eval Low Complexity: 1 Low PT Treatments $Therapeutic Activity: 8-22 mins PT General Charges $$ ACUTE PT VISIT: 1 Visit         [place], PT Acute Rehab   [place] VEAR Drone 10/07/2024, 4:59 PM

## 2024-10-07 NOTE — Hospital Course (Signed)
 83 year old with history of myelodysplastic disorder on chemo, HTN, T2DM, depression, CAD who was admitted following his last chemo was at [facility] with a UTI.  He had 3 to 4 days of altered mental status at home and his caregivers called the oncologist who recommended he come in to be treated for presumed UTI again.  Patient is noted to have some WBCs on urinalysis, negative respiratory panel, negative chest x-ray, and nonlocalizing symptoms.  He does have a hemoglobin of 7.4.  He is also mildly neutropenic.

## 2024-10-07 NOTE — Plan of Care (Signed)
   Problem: Education: Goal: Ability to describe self-care measures that may prevent or decrease complications (Diabetes Survival Skills Education) will improve Outcome: Progressing   Problem: Nutritional: Goal: Maintenance of adequate nutrition will improve Outcome: Progressing

## 2024-10-07 NOTE — TOC Initial Note (Signed)
 Transition of Care [facility]) - Initial/Assessment Note    Patient Details  Name: Jorge Sherman MRN: [id number] Date of Birth: 08-28-1941  Transition of Care [facility] CM/SW Contact:    Alfonse JONELLE Rex, RN Phone Number: 10/07/2024, 10:43 AM  Clinical Narrative:  Patient admitted from [facility] with AMS, recent admit for short term rehab,  has family contact on file, PCP and insurance on file. INPT CM will follow for dc needs                 Expected Discharge Plan: Skilled Nursing Facility Barriers to Discharge: Continued Medical Work up   Patient Goals and CMS Choice            Expected Discharge Plan and Services       Living arrangements for the past 2 months: Skilled Nursing Facility                                      Prior Living Arrangements/Services Living arrangements for the past 2 months: Skilled Nursing Facility   Patient language and need for interpreter reviewed:: Yes        Need for Family Participation in Patient Care: Yes (Comment) Care giver support system in place?: Yes (comment)   Criminal Activity/Legal Involvement Pertinent to Current Situation/Hospitalization: No - Comment as needed  Activities of Daily Living      Permission Sought/Granted                  Emotional Assessment         Alcohol  / Substance Use: Not Applicable Psych Involvement: No (comment)  Admission diagnosis:  Lower urinary tract infectious disease [N39.0] Myelodysplastic syndrome (HCC) [D46.9] Altered mental status [R41.82] Altered mental status, unspecified altered mental status type [R41.82] Patient Active Problem List   Diagnosis Date Noted   Anemia associated with chemotherapy 10/06/2024   UTI (urinary tract infection) 10/06/2024   Myelodysplastic syndrome (HCC) 08/18/2024   Anxiety 08/18/2024   Aortic stenosis 08/18/2024   Muscle tension dysphonia 08/18/2024   Symptomatic anemia 08/08/2024   Falls 10/30/2020   Mixed hyperlipidemia  due to type 2 diabetes mellitus (HCC) 10/29/2020   BPH (benign prostatic hyperplasia) 10/29/2020   Declining functional status    Goals of care, counseling/discussion    Palliative care by specialist    Altered mental status 08/28/2019   Erectile dysfunction 10/30/2013   Essential hypertension, benign 10/30/2013   Coronary artery disease involving native coronary artery of native heart without angina pectoris 10/30/2013   Pain in joint, lower leg    Type 2 diabetes mellitus without complication, with long-term current use of insulin  (HCC)    Obstructive sleep apnea    Depression    Essential and other specified forms of tremor    Headache    Spinal stenosis, lumbar region, without neurogenic claudication    Thoracic or lumbosacral neuritis or radiculitis, unspecified    Chronic pain syndrome    PCP:  [name] LABOR, MD Pharmacy:   [facility] - [place], [place] - [address] [place] [address] Phone: [phone] Fax: [phone]     Social Drivers of Health (SDOH) Social History: SDOH Screenings   Food Insecurity: No Food Insecurity (10/07/2024)  Housing: Low Risk  (10/07/2024)  Transportation Needs: No Transportation Needs (10/07/2024)  Utilities: Not At Risk (10/07/2024)  Depression (PHQ2-9): Low Risk  (09/05/2024)  Financial Resource Strain: Low Risk  (11/01/2022)   Received from [facility]  Social Connections: Socially Isolated (08/09/2024)  Stress: No Stress Concern Present (11/01/2022)   Received from [facility]  Tobacco Use: Medium Risk (10/06/2024)   SDOH Interventions:     Readmission Risk Interventions    10/07/2024   10:42 AM  Readmission Risk Prevention Plan  Transportation Screening Complete  PCP or Specialist Appt within 3-5 Days Complete  HRI or Home Care Consult Complete  Social Work Consult for Recovery Care Planning/Counseling Complete  Palliative Care Screening Not Applicable  Medication  Review Oceanographer) Complete

## 2024-10-07 NOTE — Progress Notes (Addendum)
 PROGRESS NOTE  Jorge Sherman FMW:[id number] DOB: August 19, 1941   PCP: [name] LABOR, MD  [facility] is from: [facility]   DOA: 10/06/2024 LOS: 1  Chief complaints Chief Complaint  Patient presents with   Altered Mental Status     Brief Narrative / Interim history: 83 year old M with PMH of MDS on chemo, pancytopenia, CAD, DM-2, HTN, depression, anxiety, chronic back pain/radiculopathy on opiate and constipation brought to ED by EMS due to altered mental status for 3 to 4 days.  Recently hospitalized at [facility] for UTI .  His urine culture was negative at that time.  Reportedly, sister called primary oncologist who advised sending patient to ED for evaluation and treatment.  In ED, stable vitals.  CBC with chronic pancytopenia.  BMP without significant finding.  COVID-19, influenza and RSV PCR nonreactive.  TSH 1.9.  UA without significant finding.  Urine culture sent.  Patient was admitted with altered mental status and possible UTI.  Started on aztreonam.   Jorge next day, patient is awake and oriented x 4.   Subjective: Seen and examined earlier this morning.  No major events overnight or this morning.  Reports significant lower back pain radiating down his left leg.  Reports taking Lyrica  for this which he received earlier this morning.  Denies dysuria, frequency or urgency.  Denies abdominal pain, nausea or vomiting.  He denies confusion. He says he was on conference call with his daughter and nurse which raise concern for confusion and presentation to ED.   Assessment and plan: Toxic encephalopathy due to polypharmacy: He is on 7-8 medications with potential for confusion/mental status change.  Doubt infection or UTI.  UA is negative.  TSH normal.  Unclear if he is developing some cognitive impairment.  Has no focal neurodeficit to suggest CVA.  He is currently awake and oriented x 4.  - Reorientation and delirium precaution. - Needs fine-tuning of his  medications.  Discussed with patient sister over Jorge phone. - Continue IV aztreonam pending urine cultures.  Myelodysplastic disease: On chemotherapy.  Followed by Dr. [name] Pancytopenia due to chemo-stable. - Continue monitoring - Added primary oncologist to treatment team   History of CAD/remote stent 10 to 15 years ago: No cardiopulmonary symptoms. -Continue home meds   Chronic low back pain/radiculopathy: Difficult case. Ambulatory dysfunction: Uses wheelchair at baseline.  Recently bedbound due to fall out of wheelchair -Continue home MSIR, Flexeril, Lyrica , Cymbalta  - PT/OT eval  Adhesive capsulitis of right shoulder: Very limited passive and active range of motion in right shoulder. - Therapy and pain control.  Heart murmur: Loud heart murmur globally.  Denies cardiopulmonary symptoms.  No prior echocardiogram. -Check echocardiogram   Depression/anxiety/insomnia -Continue home Cymbalta , BuSpar , trazodone /Ativan  and melatonin.  Obstructive sleep apnea -CPAP if needed   Body mass index is 28.98 kg/m.           DVT prophylaxis:  enoxaparin  (LOVENOX ) injection 40 mg Start: 10/07/24 1000  Code Status: DNR Family Communication: Updated patient's wife over Jorge phone Level of care: Med-Surg Status is: Inpatient Remains inpatient appropriate because: Altered mental status/possible UTI   Final disposition: SNF   55 minutes with more than 50% spent in reviewing records, counseling patient/family and coordinating care.  Consultants:  Primary oncologist  Procedures: None  Microbiology summarized: COVID-19, influenza and RSV PCR nonreactive Blood cultures pending  Objective: Vitals:   10/07/24 0042 10/07/24 0143 10/07/24 0518 10/07/24 0518  BP: (!) 141/82 132/89 132/76 132/76  Pulse: 91 (!)  57 83 82  Resp: 15 15 15 15   Temp: 97.7 F (36.5 C) (!) 97.5 F (36.4 C) 97.8 F (36.6 C) 97.8 F (36.6 C)  TempSrc: Oral Oral Oral Oral  SpO2:  91% 95% 93%   Weight:      Height:        Examination:  GENERAL: No apparent distress.  Nontoxic. HEENT: MMM.  Vision and hearing grossly intact.  NECK: Supple.  No apparent JVD.  RESP:  No IWOB.  Fair aeration bilaterally. CVS:  RRR.  3/6 SEM all over. ABD/GI/GU: BS+. Abd soft, NTND.  MSK/EXT:  Moves extremities. No apparent deformity. No edema.  SKIN: no apparent skin lesion or wound NEURO: AA.  Oriented x 4.  Metric strength in all extremities except right arm due to frozen shoulder.  No apparent focal neuro deficit. PSYCH: Calm. Normal affect.   Sch Meds:  Scheduled Meds:  ascorbic acid   500 mg Oral q morning   atorvastatin   80 mg Oral QHS   busPIRone   15 mg Oral TID   cyanocobalamin  5,000 mcg Oral q morning   docusate sodium   100 mg Oral BID   DULoxetine   60 mg Oral Daily   enoxaparin  (LOVENOX ) injection  40 mg Subcutaneous Q24H   ferrous sulfate   325 mg Oral Daily   fluticasone  1 spray Each Nare Daily   folic acid   500 mcg Oral Daily   insulin  aspart  0-20 Units Subcutaneous TID WC   linaclotide   145 mcg Oral Daily   loratadine  10 mg Oral Daily   LORazepam   0.5 mg Oral BID   melatonin  3 mg Oral QHS   multivitamin with minerals  1 tablet Oral Daily   pantoprazole   40 mg Oral Daily   polyethylene glycol  17 g Oral Daily   pregabalin   75 mg Oral BID   tamsulosin   0.4 mg Oral q morning   thiamine   100 mg Oral Daily   traZODone   50 mg Oral QHS   Continuous Infusions:  aztreonam 1 g (10/07/24 0555)   lactated ringers  40 mL/hr at 10/07/24 0101   PRN Meds:.acetaminophen , albuterol , cyclobenzaprine, guaiFENesin, metoprolol  tartrate, morphine , ondansetron , prochlorperazine   Antimicrobials: Anti-infectives (From admission, onward)    Start     Dose/Rate Route Frequency Ordered Stop   10/07/24 0600  aztreonam (AZACTAM) 1 g in sodium chloride  0.9 % 100 mL IVPB        1 g 200 mL/hr over 30 Minutes Intravenous Every 8 hours 10/06/24 2339     10/06/24 2345  cefTRIAXone  (ROCEPHIN )  2 g in sodium chloride  0.9 % 100 mL IVPB  Status:  Discontinued        2 g 200 mL/hr over 30 Minutes Intravenous Every 24 hours 10/06/24 2332 10/06/24 2339   10/06/24 2230  aztreonam (AZACTAM) 1 g in sodium chloride  0.9 % 100 mL IVPB        1 g 200 mL/hr over 30 Minutes Intravenous  Once 10/06/24 2218 10/07/24 0007        I have personally reviewed Jorge following labs and images: CBC: Recent Labs  Lab 10/06/24 2037 10/07/24 0057  WBC 2.6* 2.4*  NEUTROABS 1.0*  --   HGB 7.4* 7.4*  HCT 23.7* 23.7*  MCV 91.2 92.6  PLT 120* 106*   BMP &GFR Recent Labs  Lab 10/06/24 2037 10/07/24 0057  NA 136 138  K 4.4 3.9  CL 98 100  CO2 31 30  GLUCOSE 108* 103*  BUN 24* 21  CREATININE 1.03 0.94  CALCIUM  9.1 8.9   Estimated Creatinine Clearance: 67.7 mL/min (by C-G formula based on SCr of 0.94 mg/dL). Liver & Pancreas: Recent Labs  Lab 10/07/24 0057  AST 16  ALT 6  ALKPHOS 62  BILITOT 0.5  PROT 6.2*  ALBUMIN 3.4*   No results for input(s): LIPASE, AMYLASE in Jorge last 168 hours. No results for input(s): AMMONIA in Jorge last 168 hours. Diabetic: Recent Labs    10/07/24 0057  HGBA1C 5.6   Recent Labs  Lab 10/07/24 0747 10/07/24 1145  GLUCAP 109* 117*   Cardiac Enzymes: No results for input(s): CKTOTAL, CKMB, CKMBINDEX, TROPONINI in Jorge last 168 hours. No results for input(s): PROBNP in Jorge last 8760 hours. Coagulation Profile: No results for input(s): INR, PROTIME in Jorge last 168 hours. Thyroid  Function Tests: Recent Labs    10/07/24 0057  TSH 1.970   Lipid Profile: No results for input(s): CHOL, HDL, LDLCALC, TRIG, CHOLHDL, LDLDIRECT in Jorge last 72 hours. Anemia Panel: No results for input(s): VITAMINB12, FOLATE, FERRITIN, TIBC, IRON, RETICCTPCT in Jorge last 72 hours. Urine analysis:    Component Value Date/Time   COLORURINE YELLOW 10/06/2024 2028   APPEARANCEUR CLEAR 10/06/2024 2028   LABSPEC 1.015 10/06/2024 2028    PHURINE 5.0 10/06/2024 2028   GLUCOSEU NEGATIVE 10/06/2024 2028   HGBUR NEGATIVE 10/06/2024 2028   BILIRUBINUR NEGATIVE 10/06/2024 2028   KETONESUR NEGATIVE 10/06/2024 2028   PROTEINUR NEGATIVE 10/06/2024 2028   NITRITE NEGATIVE 10/06/2024 2028   LEUKOCYTESUR TRACE (A) 10/06/2024 2028   Sepsis Labs: Invalid input(s): PROCALCITONIN, LACTICIDVEN  Microbiology: Recent Results (from Jorge past 240 hours)  Resp panel by RT-PCR (RSV, Flu A&B, Covid) Anterior Nasal Swab     Status: None   Collection Time: 10/06/24  9:09 PM   Specimen: Anterior Nasal Swab  Result Value Ref Range Status   SARS Coronavirus 2 by RT PCR NEGATIVE NEGATIVE Final    Comment: (NOTE) SARS-CoV-2 target nucleic acids are NOT DETECTED.  Jorge SARS-CoV-2 RNA is generally detectable in upper respiratory specimens during Jorge acute phase of infection. Jorge lowest concentration of SARS-CoV-2 viral copies this assay can detect is 138 copies/mL. A negative result does not preclude SARS-Cov-2 infection and should not be used as Jorge sole basis for treatment or other patient management decisions. A negative result may occur with  improper specimen collection/handling, submission of specimen other than nasopharyngeal swab, presence of viral mutation(s) within Jorge areas targeted by this assay, and inadequate number of viral copies(<138 copies/mL). A negative result must be combined with clinical observations, patient history, and epidemiological information. Jorge expected result is Negative.  Fact Sheet for Patients:  bloggercourse.com  Fact Sheet for Healthcare Providers:  seriousbroker.it  This test is no t yet approved or cleared by Jorge [place]  FDA and  has been authorized for detection and/or diagnosis of SARS-CoV-2 by FDA under an Emergency Use Authorization (EUA). This EUA will remain  in effect (meaning this test can be used) for Jorge duration of Jorge COVID-19  declaration under Section 564(b)(1) of Jorge Act, 21 U.S.C.section 360bbb-3(b)(1), unless Jorge authorization is terminated  or revoked sooner.       Influenza A by PCR NEGATIVE NEGATIVE Final   Influenza B by PCR NEGATIVE NEGATIVE Final    Comment: (NOTE) Jorge Xpert Xpress SARS-CoV-2/FLU/RSV plus assay is intended as an aid in Jorge diagnosis of influenza from Nasopharyngeal swab specimens and should not be used as a sole basis for treatment.  Nasal washings and aspirates are unacceptable for Xpert Xpress SARS-CoV-2/FLU/RSV testing.  Fact Sheet for Patients: bloggercourse.com  Fact Sheet for Healthcare Providers: seriousbroker.it  This test is not yet approved or cleared by Jorge [place]  FDA and has been authorized for detection and/or diagnosis of SARS-CoV-2 by FDA under an Emergency Use Authorization (EUA). This EUA will remain in effect (meaning this test can be used) for Jorge duration of Jorge COVID-19 declaration under Section 564(b)(1) of Jorge Act, 21 U.S.C. section 360bbb-3(b)(1), unless Jorge authorization is terminated or revoked.     Resp Syncytial Virus by PCR NEGATIVE NEGATIVE Final    Comment: (NOTE) Fact Sheet for Patients: bloggercourse.com  Fact Sheet for Healthcare Providers: seriousbroker.it  This test is not yet approved or cleared by Jorge [place]  FDA and has been authorized for detection and/or diagnosis of SARS-CoV-2 by FDA under an Emergency Use Authorization (EUA). This EUA will remain in effect (meaning this test can be used) for Jorge duration of Jorge COVID-19 declaration under Section 564(b)(1) of Jorge Act, 21 U.S.C. section 360bbb-3(b)(1), unless Jorge authorization is terminated or revoked.  Performed at [facility], [address] [place], [place] [address]     Radiology Studies: DG Chest Portable 1 View Result Date:  10/06/2024 EXAM: 1 VIEW(S) XRAY OF Jorge CHEST 10/06/2024 08:13:00 PM COMPARISON: None available. CLINICAL HISTORY: ?pna pna FINDINGS: LUNGS AND PLEURA: Lungs are extremely small and full insufflation has diminished since prior examination. No focal pulmonary opacity. No pleural effusion. No pneumothorax. HEART AND MEDIASTINUM: No acute abnormality of Jorge cardiac and mediastinal silhouettes. BONES AND SOFT TISSUES: Osseous structures are age appropriate with advanced degenerative changes noted within Jorge shoulders bilaterally. IMPRESSION: 1. No acute process. 2. Pulmonary hypoinflation Electronically signed by: Dorethia Molt MD 10/06/2024 08:45 PM EST RP Workstation: [id number]      Ujbz T. Jarrin Staley [facility]  If 7PM-7AM, please contact night-coverage [email or web address] 10/07/2024, 1:36 PM

## 2024-10-07 NOTE — Telephone Encounter (Addendum)
 Patient's daughter called in stating patient has another uti and is in the hospital again. She states that this is the 2nd time he has been unable to get through his full cycle of Vidaza, that he got through the first 5 treatments and then had a uti last time and this has happened again. She is concerned about him not completing each cycle and wants to know if these missed treatments will be rescheduled or if an additional cycle will be added on at the end to make up for what was missed?   She also states that he has had worsening confusion since starting treatment and as a result, the nursing home he is in is not allowing him to use his electric wheelchair. This has resulted in him needing to be put in a diaper because he is unable to make it to there bathroom on his own. His daughter is concerned that this may be contributing to uti's. She also stated that she looked it up and saw that urinary problems are a possible side effect of treatment.   Daughter states that at last facility he was at, ([facility]) his doctor prescribed a low dose antibiotic that he was given daily to prevent uti. Daughter is requesting that this same medication be prescribed again. Phone number for [facility] is [phone]. Called and requested information on previous medication given for uti prevention. Awaiting response.   Daughter stressed that she is very concerned and would like to know what the plan will be moving forward as soon as possible. Advised that Dr [name] would be notified and our office would reach out to her with his recommendations.

## 2024-10-07 NOTE — Telephone Encounter (Signed)
 Called daughter back and let her know that per Dr [name], he will not make up treatments that were missed due to hospitalization, that we will continue with the treatment plan and reevaluate at the end to see how he's doing.   Let her know that I called [facility] and they were unable to tell me the name of what medication he was on while he was there, and that Dr [name] advised that his primary doctor manage his care in this area. Advised daughter to continue advocating for her dad's care at the facility he lives at and to have them call us  with any questions. Daughter amenable to this and had no other concerns at this time.

## 2024-10-07 NOTE — Progress Notes (Signed)
 Pt has arrived to room 1313 via stretcher by ED staff. Pt is warm, dry, no visible distress.

## 2024-10-08 ENCOUNTER — Inpatient Hospital Stay

## 2024-10-08 ENCOUNTER — Inpatient Hospital Stay (HOSPITAL_COMMUNITY)

## 2024-10-08 DIAGNOSIS — D696 Thrombocytopenia, unspecified: Secondary | ICD-10-CM

## 2024-10-08 DIAGNOSIS — N400 Enlarged prostate without lower urinary tract symptoms: Secondary | ICD-10-CM | POA: Diagnosis not present

## 2024-10-08 DIAGNOSIS — E1169 Type 2 diabetes mellitus with other specified complication: Secondary | ICD-10-CM

## 2024-10-08 DIAGNOSIS — R011 Cardiac murmur, unspecified: Secondary | ICD-10-CM

## 2024-10-08 DIAGNOSIS — R4182 Altered mental status, unspecified: Secondary | ICD-10-CM

## 2024-10-08 DIAGNOSIS — I10 Essential (primary) hypertension: Secondary | ICD-10-CM | POA: Diagnosis not present

## 2024-10-08 DIAGNOSIS — T451X5A Adverse effect of antineoplastic and immunosuppressive drugs, initial encounter: Secondary | ICD-10-CM

## 2024-10-08 DIAGNOSIS — D6481 Anemia due to antineoplastic chemotherapy: Secondary | ICD-10-CM | POA: Diagnosis not present

## 2024-10-08 DIAGNOSIS — E782 Mixed hyperlipidemia: Secondary | ICD-10-CM

## 2024-10-08 DIAGNOSIS — R410 Disorientation, unspecified: Secondary | ICD-10-CM | POA: Diagnosis not present

## 2024-10-08 LAB — RENAL FUNCTION PANEL
Albumin: 3.6 g/dL (ref 3.5–5.0)
Anion gap: 9 (ref 5–15)
BUN: 14 mg/dL (ref 8–23)
CO2: 29 mmol/L (ref 22–32)
Calcium: 9.6 mg/dL (ref 8.9–10.3)
Chloride: 100 mmol/L (ref 98–111)
Creatinine, Ser: 0.85 mg/dL (ref 0.61–1.24)
GFR, Estimated: >60 mL/min (ref >60)
Glucose, Bld: 115 mg/dL — ABNORMAL HIGH (ref 70–99)
Phosphorus: 3.1 mg/dL (ref 2.5–4.6)
Potassium: 3.9 mmol/L (ref 3.5–5.1)
Sodium: 137 mmol/L (ref 135–145)

## 2024-10-08 LAB — ECHOCARDIOGRAM COMPLETE
AR max vel: 0.89 cm2
AV Area VTI: 0.87 cm2
AV Area mean vel: 0.82 cm2
AV Mean grad: 52 mmHg
AV Peak grad: 76.4 mmHg
Ao pk vel: 4.37 m/s
Height: 70 in
S' Lateral: 2.7 cm
Weight: 3232 [oz_av]

## 2024-10-08 LAB — GLUCOSE, CAPILLARY
Glucose-Capillary: 111 mg/dL — ABNORMAL HIGH (ref 70–99)
Glucose-Capillary: 165 mg/dL — ABNORMAL HIGH (ref 70–99)
Glucose-Capillary: 114 mg/dL — ABNORMAL HIGH (ref 70–99)
Glucose-Capillary: 176 mg/dL — ABNORMAL HIGH (ref 70–99)

## 2024-10-08 LAB — URINE CULTURE

## 2024-10-08 LAB — CBC
HCT: 25.5 % — ABNORMAL LOW (ref 39.0–52.0)
Hemoglobin: 8.2 g/dL — ABNORMAL LOW (ref 13.0–17.0)
MCH: 28.8 pg (ref 26.0–34.0)
MCHC: 32.2 g/dL (ref 30.0–36.0)
MCV: 89.5 fL (ref 80.0–100.0)
Platelets: 107 K/uL — ABNORMAL LOW (ref 150–400)
RBC: 2.85 MIL/uL — ABNORMAL LOW (ref 4.22–5.81)
RDW: 15.1 % (ref 11.5–15.5)
WBC: 1.6 K/uL — ABNORMAL LOW (ref 4.0–10.5)
nRBC: 0 % (ref 0.0–0.2)

## 2024-10-08 LAB — MAGNESIUM: Magnesium: 2.3 mg/dL (ref 1.7–2.4)

## 2024-10-08 MED ORDER — PERFLUTREN LIPID MICROSPHERE
1.0000 mL | INTRAVENOUS | Status: AC | PRN
  Administered 2024-10-08: 2 mL via INTRAVENOUS

## 2024-10-08 NOTE — Plan of Care (Signed)
  Problem: Nutritional: Goal: Maintenance of adequate nutrition will improve Outcome: Adequate for Discharge   Problem: Nutrition: Goal: Adequate nutrition will be maintained Outcome: Adequate for Discharge   Problem: Elimination: Goal: Will not experience complications related to bowel motility Outcome: Adequate for Discharge   Problem: Elimination: Goal: Will not experience complications related to urinary retention Outcome: Adequate for Discharge

## 2024-10-08 NOTE — Progress Notes (Signed)
 Mr. Jorge Sherman daughter [name], called and informed me that Trazodone  and Gabapentin  should not be given because its causes his confusion and delirium worse. I relayed this information to MD Gonfa.

## 2024-10-08 NOTE — Evaluation (Signed)
 Occupational Therapy Evaluation Patient Details Name: Jorge Sherman MRN: [id number] DOB: [birth year] Today's Date: 10/08/2024   History of Present Illness   83 yo male presents to therapy following hospitalization secondary to AMS. UA negative and possible polypharmacy and delirium. Pt is LTC at  [facility]. Pt PMH includes but is not limited to: chronic pain, depression, HTN, DM II, CAD, anemia, GI bleed, muscle tension dysphonia, pancytopenia,  tremor, HA, OSA, spinal stenosis with back surgeries, givens capsule study and myelodysplastic disorder currently on chemo.     Clinical Impressions PTA, patient lives at LTC facility with staff assist for higher level IADL's and mobility. Currently, patient presents with deficits outlined below (see OT Problem List for details) most significantly pain, decreased activity tolerance, balance, ROM, muscle strength and cognition limiting BADL's and functional mobility. Patient put forth good effort and was open to all therapy presented with motivation to increase current level of independence. Patient will benefit from continued inpatient follow up therapy, <3 hours/day. Patient requires continued Acute care hospital level OT services to progress safety and functional performance and allow for discharge.       If plan is discharge home, recommend the following:   A lot of help with walking and/or transfers;A lot of help with bathing/dressing/bathroom;Assistance with cooking/housework;Help with stairs or ramp for entrance;Direct supervision/assist for medications management;Direct supervision/assist for financial management     Functional Status Assessment   Patient has had a recent decline in their functional status and demonstrates the ability to make significant improvements in function in a reasonable and predictable amount of time.     Equipment Recommendations   None recommended by OT      Precautions/Restrictions    Precautions Precautions: Fall Restrictions Weight Bearing Restrictions Per Provider Order: No     Mobility Bed Mobility Overal bed mobility: Needs Assistance Bed Mobility: Supine to Sit     Supine to sit: Contact guard, HOB elevated, Used rails     General bed mobility comments: min cues for safety    Transfers Overall transfer level: Needs assistance Equipment used: Rolling walker (2 wheels) Transfers: Sit to/from Stand, Bed to chair/wheelchair/BSC Sit to Stand: Min assist     Step pivot transfers: Min assist     General transfer comment: took several forward/back steps during transfers with good motivation and effort, cues for safety and assist for steadying      Balance Overall balance assessment: Mild deficits observed, not formally tested                                         ADL either performed or assessed with clinical judgement   ADL Overall ADL's : Needs assistance/impaired Eating/Feeding: Set up;Sitting   Grooming: Wash/dry hands;Wash/dry face;Oral care;Set up;Sitting   Upper Body Bathing: Contact guard assist;Sitting   Lower Body Bathing: Moderate assistance;Sit to/from stand   Upper Body Dressing : Contact guard assist;Sitting   Lower Body Dressing: Moderate assistance;Sit to/from stand;Cueing for sequencing;Cueing for safety   Toilet Transfer: Minimal assistance;BSC/3in1;Rolling walker (2 wheels)   Toileting- Clothing Manipulation and Hygiene: Minimal assistance       Functional mobility during ADLs: Minimal assistance;Rolling walker (2 wheels) General ADL Comments: decreased functional reach to LE's and buttocks     Vision Baseline Vision/History: 1 Wears glasses;0 No visual deficits              Pertinent Vitals/Pain Pain  Assessment Pain Assessment: Faces Faces Pain Scale: Hurts even more Pain Location: L LE, shoulders Pain Descriptors / Indicators: Aching, Constant, Discomfort Pain Intervention(s): Monitored  during session     Extremity/Trunk Assessment Upper Extremity Assessment Upper Extremity Assessment: Generalized weakness;Right hand dominant;RUE deficits/detail;LUE deficits/detail RUE: Shoulder pain with ROM LUE: Shoulder pain with ROM   Lower Extremity Assessment Lower Extremity Assessment: Defer to PT evaluation   Cervical / Trunk Assessment Cervical / Trunk Assessment: Back Surgery   Communication Communication Communication: No apparent difficulties   Cognition Arousal: Alert Behavior During Therapy: WFL for tasks assessed/performed Cognition: History of cognitive impairments                               Following commands: Impaired Following commands impaired: Follows multi-step commands inconsistently     Cueing  General Comments   Cueing Techniques: Verbal cues;Gestural cues;Tactile cues;Visual cues  skin dryness and issues with thourough hygiene with barrier cream applied to redended buttocks           Home Living Family/patient expects to be discharged to:: Skilled nursing facility                                        Prior Functioning/Environment Prior Level of Function : Needs assist             Mobility Comments: A x 1 for SPT bed <>  motorized wc ADLs Comments: staff assists for bathing and dressing, pt reports is mod I for toileting    OT Problem List: Decreased strength;Decreased range of motion;Decreased activity tolerance;Impaired balance (sitting and/or standing);Decreased cognition;Decreased safety awareness;Decreased knowledge of use of DME or AE;Decreased knowledge of precautions;Cardiopulmonary status limiting activity;Impaired UE functional use;Pain   OT Treatment/Interventions: Self-care/ADL training;Therapeutic exercise;Neuromuscular education;Energy conservation;DME and/or AE instruction;Therapeutic activities;Cognitive remediation/compensation;Patient/family education;Balance training      OT  Goals(Current goals can be found in the care plan section)   Acute Rehab OT Goals Patient Stated Goal: to get stronger to do more indepndently OT Goal Formulation: With patient Time For Goal Achievement: 10/22/24 Potential to Achieve Goals: Good ADL Goals Pt Will Perform Lower Body Bathing: with set-up;with adaptive equipment;sit to/from stand Pt Will Perform Lower Body Dressing: with set-up;sit to/from stand Pt Will Transfer to Toilet: with supervision;bedside commode;stand pivot transfer Pt Will Perform Toileting - Clothing Manipulation and hygiene: with contact guard assist;sit to/from stand Pt/caregiver will Perform Home Exercise Program: Increased strength;Independently;With written HEP provided   OT Frequency:  Min 2X/week       AM-PAC OT 6 Clicks Daily Activity     Outcome Measure Help from another person eating meals?: A Little Help from another person taking care of personal grooming?: A Little Help from another person toileting, which includes using toliet, bedpan, or urinal?: A Lot Help from another person bathing (including washing, rinsing, drying)?: A Lot Help from another person to put on and taking off regular upper body clothing?: A Little Help from another person to put on and taking off regular lower body clothing?: A Lot 6 Click Score: 15   End of Session Equipment Utilized During Treatment: Gait belt;Rolling walker (2 wheels) Nurse Communication: Mobility status;Other (comment) (request to chat chaplain as patient tearful during session)  Activity Tolerance: Patient tolerated treatment well Patient left: in chair;with call bell/phone within reach;with chair alarm set  OT Visit Diagnosis:  Unsteadiness on feet (R26.81);Other abnormalities of gait and mobility (R26.89);Muscle weakness (generalized) (M62.81);Cognitive communication deficit (R41.841);Pain Pain - part of body:  (shoulders and LE's)                Time: 8485-8449 OT Time Calculation (min): 36  min Charges:  OT General Charges $OT Visit: 1 Visit OT Evaluation $OT Eval Low Complexity: 1 Low OT Treatments $Self Care/Home Management : 8-22 mins  Marykathleen Russi OT/L Acute Rehabilitation Department  [phone]  10/08/2024, 4:55 PM

## 2024-10-08 NOTE — Progress Notes (Signed)
 Jorge Sherman   DOB:Sep 22, 1941   FM#:[id number]      ASSESSMENT & PLAN:  Jorge Sherman is an 83 year old male patient admitted 10/06/2024 with complaints of altered mental status.  Oncologic history significant for MDS, on chemotherapy.  Medical oncology following closely.  Altered mental status Suspicious for UTI - Mentation improving.  Has been on delirium precautions. - UA and cultures done.  Trace leukocytes noted. - IV antibiotics as ordered  Myelodysplastic syndrome, classified as high-risk IPSS-M  - Diagnosed September 2025 - Initiated treatment with azacitidine every 28 days.  Started on 09/02/2024.  Last received 09/30/2024. - Medical oncology/Dr. Gatha following closely and will see patient in outpatient oncology clinic at [facility] upon discharge.  Pancytopenia - Likely due to recent chemotherapy and malignancy Leukopenia - WBC low 1.6. Anemia - Hemoglobin 8.2.  No transfusional intervention warranted at this time Thrombocytopenia - Mild.  Platelets 107K. - Continue to monitor CBC with differential -- Repeat labs will be done in outpatient oncology clinic upon discharge.     Code Status DNR-Limited  Subjective:  Patient seen awake alert and oriented x 3.  Patient knew his name, which hospital he is in, and the year.  He was able to answer all other questions appropriately.  He is very pleasant and recalls stories from his youth when he played football and also during his working years at [employer].  No acute distress is noted.  Objective:   Intake/Output Summary (Last 24 hours) at 10/08/2024 1348 Last data filed at 10/08/2024 1336 Gross per 24 hour  Intake 2228.67 ml  Output 5325 ml  Net -3096.33 ml     PHYSICAL EXAMINATION: ECOG PERFORMANCE STATUS: 3 - Symptomatic, >50% confined to bed  Vitals:   10/08/24 0536 10/08/24 0808  BP: 136/77 (!) 141/91  Pulse: 90 81  Resp: 18 15  Temp: 98.4 F (36.9 C) 98.2 F (36.8 C)  SpO2: 96% 98%   Filed  Weights   10/06/24 1941  Weight: 202 lb (91.6 kg)    GENERAL: alert, no distress and comfortable +elderly SKIN: +discoloration/bruising bil UE +pale skin color EYES: normal, conjunctiva are pink and non-injected, sclera clear OROPHARYNX: no exudate, no erythema and lips, buccal mucosa, and tongue normal  NECK: supple, thyroid  normal size, non-tender, without nodularity LYMPH: no palpable lymphadenopathy in the cervical, axillary or inguinal LUNGS: clear to auscultation and percussion with normal breathing effort HEART: regular rate & rhythm and no murmurs and no lower extremity edema ABDOMEN: abdomen soft, non-tender and normal bowel sounds MUSCULOSKELETAL: +assist with ambulation/uses wheelchair  PSYCH: alert & oriented x 3 with fluent speech NEURO: no focal motor/sensory deficits   All questions were answered. The patient knows to call the clinic with any problems, questions or concerns.   The total time spent in the appointment was 40 minutes encounter with patient including review of chart and various tests results, discussions about plan of care and coordination of care plan  Olam JINNY Brunner, NP 10/08/2024 1:48 PM    Labs Reviewed:  Lab Results  Component Value Date   WBC 1.6 (L) 10/08/2024   HGB 8.2 (L) 10/08/2024   HCT 25.5 (L) 10/08/2024   MCV 89.5 10/08/2024   PLT 107 (L) 10/08/2024   Recent Labs    08/22/24 1101 08/30/24 1342 09/19/24 2026 09/19/24 2032 10/06/24 2037 10/07/24 0057 10/08/24 1102  NA 135   < > 133*   < > 136 138 137  K 4.4   < >  4.4   < > 4.4 3.9 3.9  CL 100   < > 100   < > 98 100 100  CO2 33*   < > 25   < > 31 30 29   GLUCOSE 108*   < > 117*   < > 108* 103* 115*  BUN 25*   < > 19   < > 24* 21 14  CREATININE 1.13   < > 1.02   < > 1.03 0.94 0.85  CALCIUM  9.6   < > 8.2*   < > 9.1 8.9 9.6  GFRNONAA >60   < > >60   < > >60 >60 >60  PROT 7.4  --  6.0*  --   --  6.2*  --   ALBUMIN 3.9  --  3.0*  --   --  3.4* 3.6  AST 13*  --  15  --   --  16   --   ALT 9  --  14  --   --  6  --   ALKPHOS 79  --  55  --   --  62  --   BILITOT 0.5  --  0.5  --   --  0.5  --    < > = values in this interval not displayed.    Studies Reviewed:  DG Chest Portable 1 View Result Date: 10/06/2024 EXAM: 1 VIEW(S) XRAY OF THE CHEST 10/06/2024 08:13:00 PM COMPARISON: None available. CLINICAL HISTORY: ?pna pna FINDINGS: LUNGS AND PLEURA: Lungs are extremely small and full insufflation has diminished since prior examination. No focal pulmonary opacity. No pleural effusion. No pneumothorax. HEART AND MEDIASTINUM: No acute abnormality of the cardiac and mediastinal silhouettes. BONES AND SOFT TISSUES: Osseous structures are age appropriate with advanced degenerative changes noted within the shoulders bilaterally. IMPRESSION: 1. No acute process. 2. Pulmonary hypoinflation Electronically signed by: Dorethia Molt MD 10/06/2024 08:45 PM EST RP Workstation: [id number]

## 2024-10-08 NOTE — TOC Progression Note (Addendum)
 Transition of Care [facility]) - Progression Note    Patient Details  Name: Jorge Sherman MRN: [id number] Date of Birth: 1941-03-31  [facility] CM/SW Contact  Alfonse JONELLE Rex, RN Phone Number: 10/08/2024, 2:22 PM  Clinical Narrative:   Met with patient at bedside to introduce role of INPT CM/NCM and review for dc planning, PT recommendation for short term rehab/SNF, patient agreeable. Patient confirmed he is a LTC resident at [facility], agreeable to short term rehab there. Patient reports he has a motorized wheelchair for mobility, states he has ambulated in about 7 years but would like have short term rehab. [name], admit coordinator, Soy notified, Soy confirmed no FL2 or PASRR required, just need insurance authorization for rehab to be  covered under patient's part A Medicare.   -2:43pm SNF auth initiated via [facility] Auth ID [id number].     Expected Discharge Plan: Skilled Nursing Facility Barriers to Discharge: Continued Medical Work up               Expected Discharge Plan and Services       Living arrangements for the past 2 months: Skilled Nursing Facility Expected Discharge Date: 10/08/24                                     Social Drivers of Health (SDOH) Interventions SDOH Screenings   Food Insecurity: No Food Insecurity (10/07/2024)  Housing: Low Risk  (10/07/2024)  Transportation Needs: No Transportation Needs (10/07/2024)  Utilities: Not At Risk (10/07/2024)  Depression (PHQ2-9): Low Risk  (09/05/2024)  Financial Resource Strain: Low Risk  (11/01/2022)   [facility]  Social Connections: Socially Isolated (10/07/2024)  Stress: No Stress Concern Present (11/01/2022)   Received from [facility]  Tobacco Use: Medium Risk (10/06/2024)    Readmission Risk Interventions    10/07/2024   10:42 AM  Readmission Risk Prevention Plan  Transportation Screening Complete  PCP or Specialist Appt within 3-5 Days  Complete  HRI or Home Care Consult Complete  Social Work Consult for Recovery Care Planning/Counseling Complete  Palliative Care Screening Not Applicable  Medication Review Oceanographer) Complete

## 2024-10-08 NOTE — Progress Notes (Signed)
  Echocardiogram 2D Echocardiogram has been performed.  Jorge Sherman 10/08/2024, 11:31 AM

## 2024-10-08 NOTE — Discharge Summary (Addendum)
 Physician Discharge Summary  Jorge Sherman FMW:[id number] DOB: 09/06/1941 DOA: 10/06/2024  PCP: Jorge Sherman LABOR, MD  Admit date: 10/06/2024 Discharge date: 10/08/24  Admitted From: LTC Disposition: SNF Recommendations for Outpatient Follow-up:  Cardiology recommended outpatient follow-up with his primary cardiologist at [facility] for severe aortic stenosis Patient is at risk for polypharmacy on multiple sedating meds.  Please review meds and adjust as appropriate Check CMP and CBC at follow-up. Please follow up on the following pending results: None   Discharge Condition: Stable CODE STATUS: DNR Diet Orders (From admission, onward)     Start     Ordered   10/06/24 2326  Diet Heart Room service appropriate? Yes; Fluid consistency: Thin  Diet effective now       Question Answer Comment  Room service appropriate? Yes   Fluid consistency: Thin      10/06/24 2329              Hospital course 83 year old M with PMH of MDS on chemo, pancytopenia, CAD, DM-2, HTN, depression, anxiety, chronic back pain/radiculopathy on opiate and constipation brought to ED by EMS due to altered mental status for 3 to 4 days.  Recently hospitalized at [facility] for UTI .  His urine culture was negative at that time.  Reportedly, sister called primary oncologist who advised sending patient to ED for evaluation and treatment.   In ED, stable vitals.  CBC with chronic pancytopenia.  BMP without significant finding.  COVID-19, influenza and RSV PCR nonreactive.  TSH 1.9.  UA without significant finding.  Urine culture sent.  Patient was admitted with altered mental status and possible UTI.  Started on aztreonam.    The next day, patient is awake and oriented x 4.  Continued on aztreonam.  Urine culture with multiple species.  On the day of discharge, completed 3 days of aztreonam although suspicion for UTI remain low.  Mental status improved.  He remained oriented x 4.   Patient is at  risk for polypharmacy on multiple sedating medication.  We discontinue trazodone  at the daughter's request.  We also discontinued Compazine .  We strongly recommend reviewing the rest of his medication and adjusting as appropriate.  Assessment and plan discussed with patient's daughter over the phone on the day of discharge.  See individual problem list below for more.   Problems addressed during this hospitalization Toxic encephalopathy due to polypharmacy: On 7-8 medications with potential for confusion/mental status change.  Doubt infection or UTI.  UA is negative.  TSH normal.  Unclear if he is developing some cognitive impairment.  Has no focal neurodeficit to suggest CVA.  He is currently awake and oriented x 4.  - Reorientation and delirium precaution. - Needs fine-tuning of his medications.  Discussed with patient sister over the phone.   Myelodysplastic disease: On chemotherapy.  Followed by Dr. [name] Pancytopenia due to chemo-relatively stable. -Evaluated by oncology.  Cleared for discharge outpatient follow-up. - Outpatient follow-up with oncology.    History of CAD/remote stent 10 to 15 years ago: No cardiopulmonary symptoms. -Continue home meds  Severe aortic stenosis: Patient noted to have loud murmur globally.  TTE with LVEF of 65 to 70%, indeterminate DD and severe aortic stenosis.  Discussed with on-call cardiologist, Dr. Deneise who recommended outpatient follow-up.  Patient is followed at [facility].  Please arrange this follow-up   Chronic low back pain/radiculopathy: Difficult case. Ambulatory dysfunction: Uses wheelchair at baseline.  Recently bedbound due to fall out of wheelchair -Continue home  MSIR, Flexeril, Lyrica , Cymbalta  - PT/OT eval   Adhesive capsulitis of right shoulder: Very limited passive and active range of motion in right shoulder. - Therapy and pain control.   Depression/anxiety/insomnia -Continue home Cymbalta , BuSpar , trazodone /Ativan  and  melatonin.   Obstructive sleep apnea not on CPAP -Minimize avoid sedating medications as much as possible  Polypharmacy: Patient is on multiple sedating medications and remains at risk for confusion and sedation.  We discontinued trazodone  at daughter's request.  Discontinued Compazine  as well.  Strongly recommend fine-tuning of his medication  Body mass index is 28.98 kg/m.           Consultations: None  Time spent 35  minutes  Vital signs Vitals:   10/07/24 2124 10/08/24 0536 10/08/24 0808 10/08/24 1421  BP: (!) 157/77 136/77 (!) 141/91 139/78  Pulse: 86 90 81 85  Temp: 98.8 F (37.1 C) 98.4 F (36.9 C) 98.2 F (36.8 C) 98.4 F (36.9 C)  Resp: 18 18 15 16   Height:      Weight:      SpO2: 94% 96% 98% 98%  TempSrc: Oral Oral Oral Oral  BMI (Calculated):         Discharge exam  GENERAL: No apparent distress.  Nontoxic. HEENT: MMM.  Vision and hearing grossly intact.  NECK: Supple.  No apparent JVD.  RESP:  No IWOB.  Fair aeration bilaterally. CVS:  RRR.  3/6 SEM all over. ABD/GI/GU: BS+. Abd soft, NTND.  MSK/EXT: Limited ROM in right shoulder.  No edema.  SKIN: no apparent skin lesion or wound NEURO: AA.  Oriented x 4.  Limited ROM in right shoulder likely from frozen shoulder.  Otherwise, no apparent focal neuro deficit. PSYCH: Calm. Normal affect.   Discharge Instructions Discharge Instructions     Increase activity slowly   Complete by: As directed       Allergies as of 10/08/2024       Reactions   Cefepime Rash   Redness   Iodinated Contrast Media Dermatitis, Hives, Other (See Comments)   A few hives post CT     Desc: A FEW HIVES POST CT, IODINE   Iohexol  Hives, Other (See Comments)   A few hives post CT   Tositumomab    Fentanyl  Itching, Nausea And Vomiting, Other (See Comments)   Patient states that it happened over 30 yrs ago   Iodine Hives, Rash        Medication List     PAUSE taking these medications    hydroxychloroquine  200  MG tablet Wait to take this until your doctor or other care provider tells you to start again. Commonly known as: PLAQUENIL  Take 200 mg by mouth 2 (two) times daily.       STOP taking these medications    prochlorperazine  10 MG tablet Commonly known as: COMPAZINE    traZODone  50 MG tablet Commonly known as: DESYREL        TAKE these medications    acetaminophen  500 MG tablet Commonly known as: TYLENOL  Take 2 tablets (1,000 mg total) by mouth every 6 (six) hours as needed for headache, mild pain or fever. What changed: when to take this   albuterol  108 (90 Base) MCG/ACT inhaler Commonly known as: VENTOLIN  HFA Inhale 2 puffs into the lungs every 4 (four) hours as needed for wheezing or shortness of breath.   alum & mag hydroxide-simeth 400-400-40 MG/5ML suspension Commonly known as: MAALOX PLUS Take 30 mLs by mouth every 4 (four) hours as needed for indigestion.  ascorbic acid  500 MG tablet Commonly known as: VITAMIN C Take 500 mg by mouth daily.   atorvastatin  80 MG tablet Commonly known as: LIPITOR  Take 80 mg by mouth at bedtime.   busPIRone  15 MG tablet Commonly known as: BUSPAR  Take 15 mg by mouth 3 (three) times daily.   Cranberry 450 MG Tabs Take 450 mg by mouth in the morning.   Cyanocobalamin 5000 MCG Tbdp Place 5,000 mcg under the tongue every morning.   cyclobenzaprine 10 MG tablet Commonly known as: FLEXERIL Take 10 mg by mouth 3 (three) times daily as needed for muscle spasms.   DSS 100 MG Caps Take 100 mg by mouth in the morning and at bedtime.   DULoxetine  60 MG capsule Commonly known as: CYMBALTA  Take 60 mg by mouth daily.   ferrous sulfate  325 (65 FE) MG tablet Take 325 mg by mouth daily.   fluticasone 50 MCG/ACT nasal spray Commonly known as: FLONASE Place 1 spray into the nose daily.   folic acid  400 MCG tablet Commonly known as: FOLVITE  Take 400 mcg by mouth every morning.   guaiFENesin 100 MG/5ML liquid Commonly known as:  ROBITUSSIN Take 10 mLs by mouth every 4 (four) hours as needed for cough.   lidocaine  4 % Place 1 patch onto the skin in the morning.   Linzess  145 MCG Caps capsule Generic drug: linaclotide  Take 145 mcg by mouth daily.   loratadine 10 MG tablet Commonly known as: CLARITIN Take 10 mg by mouth in the morning.   LORazepam  0.5 MG tablet Commonly known as: ATIVAN  Take 0.5 mg by mouth 2 (two) times daily.   melatonin 3 MG Tabs tablet Take 3 mg by mouth at bedtime.   morphine  15 MG tablet Commonly known as: MSIR Take 15 mg by mouth every 8 (eight) hours as needed for severe pain (pain score 7-10).   multivitamin with minerals Tabs tablet Take 1 tablet by mouth daily.   NATURAL BALANCE TEARS OP Place 2 drops into both eyes in the morning and at bedtime.   ondansetron  4 MG tablet Commonly known as: ZOFRAN  Take 1 tablet (4 mg total) by mouth every 6 (six) hours as needed for nausea. What changed: Another medication with the same name was removed. Continue taking this medication, and follow the directions you see here.   pantoprazole  40 MG tablet Commonly known as: PROTONIX  Take 1 tablet (40 mg total) by mouth daily.   polyethylene glycol 17 g packet Commonly known as: MIRALAX  / GLYCOLAX  Take 17 g by mouth daily as needed for mild constipation.   pregabalin  75 MG capsule Commonly known as: LYRICA  Take 75 mg by mouth 2 (two) times daily.   sennosides-docusate sodium  8.6-50 MG tablet Commonly known as: SENOKOT-S Take 2 tablets by mouth in the morning and at bedtime.   SUMAtriptan  50 MG tablet Commonly known as: IMITREX  Take 50 mg by mouth every 2 (two) hours as needed for migraine or headache.   tamsulosin  0.4 MG Caps capsule Commonly known as: FLOMAX  Take 0.4 mg by mouth every morning.   thiamine  100 MG tablet Commonly known as: VITAMIN B1 Take 1 tablet (100 mg total) by mouth daily.         Procedures/Studies:   ECHOCARDIOGRAM COMPLETE Result Date:  10/08/2024    ECHOCARDIOGRAM REPORT   Patient Name:   Jorge Sherman Date of Exam: 10/08/2024 Medical Rec #:  [id number]       Height:       70.0 in  Accession #:    [id number]      Weight:       202.0 lb Date of Birth:  06/08/1941       BSA:          2.096 m Patient Age:    83 years        BP:           136/77 mmHg Patient Gender: M               HR:           83 bpm. Exam Location:  Inpatient Procedure: 2D Echo and Intracardiac Opacification Agent (Both Spectral and Color            Flow Doppler were utilized during procedure). Indications:    Murmur  History:        Patient has no prior history of Echocardiogram examinations.  Sonographer:    Charmaine Gaskins Referring Phys: [id number] MIGNON DASEN Shiquita Collignon  Sonographer Comments: No subcostal window and Technically difficult study due to poor echo windows. Supine position and Image acquisition challenging due to respiratory motion. IMPRESSIONS  1. Left ventricular ejection fraction, by estimation, is 65 to 70%. The left ventricle has normal function. Indeterminate diastolic filling due to E-A fusion.  2. Right ventricular systolic function is normal. The right ventricular size is normal.  3. Left atrial size was mildly dilated.  4. Trivial mitral valve regurgitation.  5. AV is thickened, calcified with restricted motion. Peak and mean gradients through the valve are 76 and 52 mm HG respectively AVA (VTI) is 0.81 cm2. Dimensionless valve indext is 0.23 OVerall consistent with severe AS.SABRA The aortic valve has an indeterminant number of cusps. Aortic valve regurgitation is not visualized. FINDINGS  Left Ventricle: Left ventricular ejection fraction, by estimation, is 65 to 70%. The left ventricle has normal function. Definity contrast agent was given IV to delineate the left ventricular endocardial borders. The left ventricular internal cavity size was normal in size. There is no left ventricular hypertrophy. Indeterminate diastolic filling due to E-A fusion. Right Ventricle: The  right ventricular size is normal. Right vetricular wall thickness was not assessed. Right ventricular systolic function is normal. Left [facility]: Left atrial size was mildly dilated. Right [facility]: Right atrial size was normal in size. Pericardium: There is no evidence of pericardial effusion. Mitral Valve: There is mild thickening of the mitral valve leaflet(s). Mild mitral annular calcification. Trivial mitral valve regurgitation. Tricuspid Valve: The tricuspid valve is normal in structure. Tricuspid valve regurgitation is trivial. Aortic Valve: AV is thickened, calcified with restricted motion. Peak and mean gradients through the valve are 76 and 52 mm HG respectively AVA (VTI) is 0.81 cm2. Dimensionless valve indext is 0.23 OVerall consistent with severe AS. The aortic valve has an indeterminant number of cusps. Aortic valve regurgitation is not visualized. Aortic valve mean gradient measures 52.0 mmHg. Aortic valve peak gradient measures 76.4 mmHg. Aortic valve area, by VTI measures 0.87 cm. Pulmonic Valve: The pulmonic valve was normal in structure. Pulmonic valve regurgitation is not visualized. Aorta: The aortic root and ascending aorta are structurally normal, with no evidence of dilitation. IAS/Shunts: No atrial level shunt detected by color flow Doppler.  LEFT VENTRICLE PLAX 2D LVIDd:         4.60 cm LVIDs:         2.70 cm LV PW:         0.90 cm LV IVS:        0.90 cm  LVOT diam:     2.20 cm LV SV:         84 LV SV Index:   40 LVOT Area:     3.80 cm  RIGHT VENTRICLE RV Basal diam:  3.30 cm RV Mid diam:    3.10 cm RV S prime:     14.00 cm/s LEFT ATRIUM           Index        RIGHT ATRIUM           Index LA diam:      4.00 cm 1.91 cm/m   RA Area:     20.10 cm LA Vol (A2C): 61.5 ml 29.34 ml/m  RA Volume:   58.90 ml  28.10 ml/m LA Vol (A4C): 83.8 ml 39.98 ml/m  AORTIC VALVE AV Area (Vmax):    0.89 cm AV Area (Vmean):   0.82 cm AV Area (VTI):     0.87 cm AV Vmax:           437.00 cm/s AV Vmean:           354.000 cm/s AV VTI:            0.967 m AV Peak Grad:      76.4 mmHg AV Mean Grad:      52.0 mmHg LVOT Vmax:         102.25 cm/s LVOT Vmean:        76.800 cm/s LVOT VTI:          0.221 m LVOT/AV VTI ratio: 0.23  AORTA Ao Root diam: 3.40 cm Ao Asc diam:  3.00 cm  SHUNTS Systemic VTI:  0.22 m Systemic Diam: 2.20 cm Vina Gull MD Electronically signed by Vina Gull MD Signature Date/Time: 10/08/2024/2:39:34 PM    Final    DG Chest Portable 1 View Result Date: 10/06/2024 EXAM: 1 VIEW(S) XRAY OF THE CHEST 10/06/2024 08:13:00 PM COMPARISON: None available. CLINICAL HISTORY: ?pna pna FINDINGS: LUNGS AND PLEURA: Lungs are extremely small and full insufflation has diminished since prior examination. No focal pulmonary opacity. No pleural effusion. No pneumothorax. HEART AND MEDIASTINUM: No acute abnormality of the cardiac and mediastinal silhouettes. BONES AND SOFT TISSUES: Osseous structures are age appropriate with advanced degenerative changes noted within the shoulders bilaterally. IMPRESSION: 1. No acute process. 2. Pulmonary hypoinflation Electronically signed by: Dorethia Molt MD 10/06/2024 08:45 PM EST RP Workstation: [id number]       The results of significant diagnostics from this hospitalization (including imaging, microbiology, ancillary and laboratory) are listed below for reference.     Microbiology: Recent Results (from the past 240 hours)  Urine Culture     Status: Abnormal   Collection Time: 10/06/24  8:28 PM   Specimen: Urine, Random  Result Value Ref Range Status   Specimen Description   Final    URINE, RANDOM Performed at [facility], [address] [place], [place] [address]    Special Requests   Final    NONE Reflexed from [id number] Performed at [facility], [address] [place], [place] [address]    Culture MULTIPLE SPECIES PRESENT, SUGGEST RECOLLECTION (A)  Final   Report Status 10/08/2024 FINAL  Final  Resp panel by RT-PCR (RSV,  Flu A&B, Covid) Anterior Nasal Swab     Status: None   Collection Time: 10/06/24  9:09 PM   Specimen: Anterior Nasal Swab  Result Value Ref Range Status   SARS Coronavirus 2 by RT PCR NEGATIVE NEGATIVE Final  Comment: (NOTE) SARS-CoV-2 target nucleic acids are NOT DETECTED.  The SARS-CoV-2 RNA is generally detectable in upper respiratory specimens during the acute phase of infection. The lowest concentration of SARS-CoV-2 viral copies this assay can detect is 138 copies/mL. A negative result does not preclude SARS-Cov-2 infection and should not be used as the sole basis for treatment or other patient management decisions. A negative result may occur with  improper specimen collection/handling, submission of specimen other than nasopharyngeal swab, presence of viral mutation(s) within the areas targeted by this assay, and inadequate number of viral copies(<138 copies/mL). A negative result must be combined with clinical observations, patient history, and epidemiological information. The expected result is Negative.  Fact Sheet for Patients:  bloggercourse.com  Fact Sheet for Healthcare Providers:  seriousbroker.it  This test is no t yet approved or cleared by the [place]  FDA and  has been authorized for detection and/or diagnosis of SARS-CoV-2 by FDA under an Emergency Use Authorization (EUA). This EUA will remain  in effect (meaning this test can be used) for the duration of the COVID-19 declaration under Section 564(b)(1) of the Act, 21 U.S.C.section 360bbb-3(b)(1), unless the authorization is terminated  or revoked sooner.       Influenza A by PCR NEGATIVE NEGATIVE Final   Influenza B by PCR NEGATIVE NEGATIVE Final    Comment: (NOTE) The Xpert Xpress SARS-CoV-2/FLU/RSV plus assay is intended as an aid in the diagnosis of influenza from Nasopharyngeal swab specimens and should not be used as a sole basis for  treatment. Nasal washings and aspirates are unacceptable for Xpert Xpress SARS-CoV-2/FLU/RSV testing.  Fact Sheet for Patients: bloggercourse.com  Fact Sheet for Healthcare Providers: seriousbroker.it  This test is not yet approved or cleared by the [place]  FDA and has been authorized for detection and/or diagnosis of SARS-CoV-2 by FDA under an Emergency Use Authorization (EUA). This EUA will remain in effect (meaning this test can be used) for the duration of the COVID-19 declaration under Section 564(b)(1) of the Act, 21 U.S.C. section 360bbb-3(b)(1), unless the authorization is terminated or revoked.     Resp Syncytial Virus by PCR NEGATIVE NEGATIVE Final    Comment: (NOTE) Fact Sheet for Patients: bloggercourse.com  Fact Sheet for Healthcare Providers: seriousbroker.it  This test is not yet approved or cleared by the [place]  FDA and has been authorized for detection and/or diagnosis of SARS-CoV-2 by FDA under an Emergency Use Authorization (EUA). This EUA will remain in effect (meaning this test can be used) for the duration of the COVID-19 declaration under Section 564(b)(1) of the Act, 21 U.S.C. section 360bbb-3(b)(1), unless the authorization is terminated or revoked.  Performed at [facility], [address] [place], [place] [address]      Labs:  CBC: Recent Labs  Lab 10/06/24 2037 10/07/24 0057 10/08/24 1102  WBC 2.6* 2.4* 1.6*  NEUTROABS 1.0*  --   --   HGB 7.4* 7.4* 8.2*  HCT 23.7* 23.7* 25.5*  MCV 91.2 92.6 89.5  PLT 120* 106* 107*   BMP &GFR Recent Labs  Lab 10/06/24 2037 10/07/24 0057 10/08/24 1102  NA 136 138 137  K 4.4 3.9 3.9  CL 98 100 100  CO2 31 30 29   GLUCOSE 108* 103* 115*  BUN 24* 21 14  CREATININE 1.03 0.94 0.85  CALCIUM  9.1 8.9 9.6  MG  --   --  2.3  PHOS  --   --  3.1   Estimated Creatinine  Clearance: 74.9 mL/min (by  C-G formula based on SCr of 0.85 mg/dL). Liver & Pancreas: Recent Labs  Lab 10/07/24 0057 10/08/24 1102  AST 16  --   ALT 6  --   ALKPHOS 62  --   BILITOT 0.5  --   PROT 6.2*  --   ALBUMIN 3.4* 3.6   No results for input(s): LIPASE, AMYLASE in the last 168 hours. No results for input(s): AMMONIA in the last 168 hours. Diabetic: Recent Labs    10/07/24 0057  HGBA1C 5.6   Recent Labs  Lab 10/07/24 1145 10/07/24 1709 10/07/24 2102 10/08/24 0810 10/08/24 1202  GLUCAP 117* 134* 93 111* 165*   Cardiac Enzymes: No results for input(s): CKTOTAL, CKMB, CKMBINDEX, TROPONINI in the last 168 hours. No results for input(s): PROBNP in the last 8760 hours. Coagulation Profile: No results for input(s): INR, PROTIME in the last 168 hours. Thyroid  Function Tests: Recent Labs    10/07/24 0057  TSH 1.970   Lipid Profile: No results for input(s): CHOL, HDL, LDLCALC, TRIG, CHOLHDL, LDLDIRECT in the last 72 hours. Anemia Panel: No results for input(s): VITAMINB12, FOLATE, FERRITIN, TIBC, IRON, RETICCTPCT in the last 72 hours. Urine analysis:    Component Value Date/Time   COLORURINE YELLOW 10/06/2024 2028   APPEARANCEUR CLEAR 10/06/2024 2028   LABSPEC 1.015 10/06/2024 2028   PHURINE 5.0 10/06/2024 2028   GLUCOSEU NEGATIVE 10/06/2024 2028   HGBUR NEGATIVE 10/06/2024 2028   BILIRUBINUR NEGATIVE 10/06/2024 2028   KETONESUR NEGATIVE 10/06/2024 2028   PROTEINUR NEGATIVE 10/06/2024 2028   NITRITE NEGATIVE 10/06/2024 2028   LEUKOCYTESUR TRACE (A) 10/06/2024 2028   Sepsis Labs: Invalid input(s): PROCALCITONIN, LACTICIDVEN   SIGNED:  Jurell Basista T Yun Gutierrez, MD  [facility] 10/08/2024, 3:09 PM

## 2024-10-09 DIAGNOSIS — R410 Disorientation, unspecified: Secondary | ICD-10-CM | POA: Diagnosis not present

## 2024-10-09 DIAGNOSIS — Z79899 Other long term (current) drug therapy: Secondary | ICD-10-CM

## 2024-10-09 DIAGNOSIS — D6481 Anemia due to antineoplastic chemotherapy: Secondary | ICD-10-CM | POA: Diagnosis not present

## 2024-10-09 DIAGNOSIS — N400 Enlarged prostate without lower urinary tract symptoms: Secondary | ICD-10-CM | POA: Diagnosis not present

## 2024-10-09 DIAGNOSIS — I10 Essential (primary) hypertension: Secondary | ICD-10-CM | POA: Diagnosis not present

## 2024-10-09 LAB — GLUCOSE, CAPILLARY
Glucose-Capillary: 99 mg/dL (ref 70–99)
Glucose-Capillary: 225 mg/dL — ABNORMAL HIGH (ref 70–99)

## 2024-10-09 NOTE — TOC Transition Note (Addendum)
 Transition of Care [facility]) - Discharge Note   Patient Details  Name: Jorge Sherman MRN: [id number] Date of Birth: May 06, 1941  [facility] CM/SW Contact:  Alfonse JONELLE Rex, RN Phone Number: 10/09/2024, 2:18 PM   Clinical Narrative:   DC back to [name], SNF auth request pending, [facility] requested a Peer to Peer due by 10/10/24 9:30am EST, attending notified, Soy, admit coordinator w/[facility] notified. Per Soy patient will return to his LTC bed, await outcome of peer to peer.  [facility] for transport. No further INPT CM needs identified at this time.   -2:44pm Teams chat received from attending   No peer to peer. He can just go back to LTC. He is WC at baseline . Soy, admit coordinator w/[facility] notified.     Final next level of care: Skilled Nursing Facility Barriers to Discharge: Barriers Resolved   Patient Goals and CMS Choice Patient states their goals for this hospitalization and ongoing recovery are:: return to [name]          Discharge Placement                Patient to be transferred to facility by: [facility] Name of family member notified: Torrence Hammack, daughter Patient and family notified of of transfer: 10/09/24  Discharge Plan and Services Additional resources added to the After Visit Summary for                                       Social Drivers of Health (SDOH) Interventions SDOH Screenings   Food Insecurity: No Food Insecurity (10/07/2024)  Housing: Low Risk  (10/07/2024)  Transportation Needs: No Transportation Needs (10/07/2024)  Utilities: Not At Risk (10/07/2024)  Depression (PHQ2-9): Low Risk  (09/05/2024)  Financial Resource Strain: Low Risk  (11/01/2022)   Received from [facility]  Social Connections: Socially Isolated (10/07/2024)  Stress: No Stress Concern Present (11/01/2022)   Received from [facility]  Tobacco Use: Medium Risk (10/06/2024)     Readmission Risk Interventions     10/07/2024   10:42 AM  Readmission Risk Prevention Plan  Transportation Screening Complete  PCP or Specialist Appt within 3-5 Days Complete  HRI or Home Care Consult Complete  Social Work Consult for Recovery Care Planning/Counseling Complete  Palliative Care Screening Not Applicable  Medication Review Oceanographer) Complete

## 2024-10-09 NOTE — Progress Notes (Signed)
 Physical Therapy Treatment Patient Details Name: ABDULAZIZ Sherman MRN: [id number] DOB: 1940/12/09 Today's Date: 10/09/2024   History of Present Illness 83 yo male presents to therapy following hospitalization secondary to AMS. UA negative and possible polypharmacy and delirium. Pt is LTC at  [facility]. Pt PMH includes but is not limited to: chronic pain, depression, HTN, DM II, CAD, anemia, GI bleed, muscle tension dysphonia, pancytopenia,  tremor, HA, OSA, spinal stenosis with back surgeries, givens capsule study and myelodysplastic disorder currently on chemo.    PT Comments  Pt pleasant, alert and oriented, declines OOB activity due to headache and chronic pain but requests supine exercises. Pt able to perform ankle pumps, SAQ, hip abd/add and SLR with good muscle activation, cued for motor control and decreased momentum. Notified nurse pt requesting pain medication at end of session.   If plan is discharge home, recommend the following: Two people to help with walking and/or transfers;A lot of help with bathing/dressing/bathroom;Assistance with cooking/housework;Assist for transportation;Supervision due to cognitive status   Can travel by private vehicle     No  Equipment Recommendations       Recommendations for Other Services       Precautions / Restrictions Precautions Precautions: Fall Restrictions Weight Bearing Restrictions Per Provider Order: No     Mobility  Bed Mobility               General bed mobility comments: pt declines OOB activity due to headache    Transfers                        Ambulation/Gait                   Stairs             Wheelchair Mobility     Tilt Bed    Modified Rankin (Stroke Patients Only)       Balance                                            Communication Communication Communication: No apparent difficulties  Cognition Arousal: Alert Behavior During Therapy: WFL  for tasks assessed/performed   PT - Cognitive impairments: No family/caregiver present to determine baseline                       PT - Cognition Comments: pt pleasant, conversational regarding personal life, follows commands, alert and oriented Following commands: Intact      Cueing Cueing Techniques: Verbal cues  Exercises General Exercises - Lower Extremity Ankle Circles/Pumps: AROM, Both, 20 reps, Supine Short Arc Quad: AROM, Both, 15 reps, Supine Hip ABduction/ADduction: AROM, Both, 15 reps, Supine    General Comments        Pertinent Vitals/Pain Pain Assessment Pain Assessment: 0-10 Pain Score: 9  Pain Location: head, shoulders, knees Pain Descriptors / Indicators:  (just a ball of pain) Pain Intervention(s): Limited activity within patient's tolerance, Monitored during session, Patient requesting pain meds-RN notified    Home Living                          Prior Function            PT Goals (current goals can now be found in the care plan section) Acute Rehab PT Goals Patient  Stated Goal: know what happened and get out of here PT Goal Formulation: With patient Time For Goal Achievement: 10/21/24 Potential to Achieve Goals: Fair Progress towards PT goals: Progressing toward goals    Frequency    Min 2X/week      PT Plan      Co-evaluation              AM-PAC PT 6 Clicks Mobility   Outcome Measure  Help needed turning from your back to your side while in a flat bed without using bedrails?: None Help needed moving from lying on your back to sitting on the side of a flat bed without using bedrails?: A Little Help needed moving to and from a bed to a chair (including a wheelchair)?: A Little Help needed standing up from a chair using your arms (e.g., wheelchair or bedside chair)?: A Little Help needed to walk in hospital room?: Total Help needed climbing 3-5 steps with a railing? : Total 6 Click Score: 15    End of  Session   Activity Tolerance: Patient tolerated treatment well Patient left: in bed;with call bell/phone within reach;with bed alarm set Nurse Communication: Mobility status;Patient requests pain meds PT Visit Diagnosis: Unsteadiness on feet (R26.81);Other abnormalities of gait and mobility (R26.89);Muscle weakness (generalized) (M62.81);Difficulty in walking, not elsewhere classified (R26.2);Pain Pain - Right/Left: Left Pain - part of body: Knee;Hip;Leg     Time: 8983-8965 PT Time Calculation (min) (ACUTE ONLY): 18 min  Charges:    $Therapeutic Exercise: 8-22 mins PT General Charges $$ ACUTE PT VISIT: 1 Visit                     Jorge Sherman PT, DPT 10/09/24, 10:38 AM

## 2024-10-09 NOTE — Progress Notes (Signed)
 I met with Jorge Sherman to provide support.  He was tearful and asked to meet with someone to get something off his chest.    He shared about his family dynamics, particularly with his children.  He is feeling intense guilt over some decisions that he would like to be able to make.  He states that he is miserable at the facility where he has been living and wants to move to a condo that he owns.  He feels that he would be able to care for himself there with a little bit of help a few times per week.  His children want him to remain where he is and sell the condo.  The situation is causing him significant distress and making him distrustful of his family and those at the facility.  He requested advice, but I let him know that it was not my role to give advice.  Instead I provided him with emotional support through listening and facilitating deeper self reflection about his emotions, his needs, his grief and his guilt. I also affirmed that I heard how much he loves his children and wants to have a good relationship with them.   Chaplains remain available.  Please page as needs arise.   [address] [place] Pager, [phone]

## 2024-10-09 NOTE — Plan of Care (Signed)

## 2024-10-09 NOTE — Discharge Summary (Signed)
 Physician Discharge Summary  [name]:[id number] DOB: 30-Jan-1941 DOA: 10/06/2024  PCP: [name] LABOR, MD  Admit date: 10/06/2024 Discharge date: 10/09/24  Admitted From: LTC Disposition: SNF Recommendations for Outpatient Follow-up:  Cardiology recommended outpatient follow-up with his primary cardiologist at [facility] for severe aortic stenosis Patient is at risk for polypharmacy on multiple sedating meds.  Please review meds and adjust as appropriate Check CMP and CBC at follow-up. Please follow up on the following pending results: None   Discharge Condition: Stable CODE STATUS: DNR Diet Orders (From admission, onward)     Start     Ordered   10/06/24 2326  Diet Heart Room service appropriate? Yes; Fluid consistency: Thin  Diet effective now       Question Answer Comment  Room service appropriate? Yes   Fluid consistency: Thin      10/06/24 2329              Hospital course 83 year old M with PMH of MDS on chemo, pancytopenia, CAD, DM-2, HTN, depression, anxiety, chronic back pain/radiculopathy on opiate and constipation brought to ED by EMS due to altered mental status for 3 to 4 days.  Recently hospitalized at [facility] for UTI .  His urine culture was negative at that time.  Reportedly, sister called primary oncologist who advised sending patient to ED for evaluation and treatment.   In ED, stable vitals.  CBC with chronic pancytopenia.  BMP without significant finding.  COVID-19, influenza and RSV PCR nonreactive.  TSH 1.9.  UA without significant finding.  Urine culture sent.  Patient was admitted with altered mental status and possible UTI.  Started on aztreonam.    The next day, patient is awake and oriented x 4.  Continued on aztreonam.  Urine culture with multiple species.  On the day of discharge, completed 3 days of aztreonam although suspicion for UTI remain low.  Mental status improved.  He remained oriented x 4.   Patient is at  risk for polypharmacy on multiple sedating medication.  We discontinue trazodone  at the daughter's request.  We also discontinued Compazine .  We strongly recommend reviewing the rest of his medication and adjusting as appropriate.  Assessment and plan discussed with patient's daughter over the phone on the day of discharge.  See individual problem list below for more.   Problems addressed during this hospitalization Toxic encephalopathy due to polypharmacy: On 7-8 medications with potential for confusion/mental status change.  Doubt infection or UTI.  UA is negative.  TSH normal.  Unclear if he is developing some cognitive impairment.  Has no focal neurodeficit to suggest CVA.  He is currently awake and oriented x 4.  - Reorientation and delirium precaution. - Needs fine-tuning of his medications.  Discussed with patient sister over the phone.   Myelodysplastic disease: On chemotherapy.  Followed by Dr. [name] Pancytopenia due to chemo-relatively stable. -Evaluated by oncology.  Cleared for discharge outpatient follow-up. - Outpatient follow-up with oncology.    History of CAD/remote stent 10 to 15 years ago: No cardiopulmonary symptoms. -Continue home meds  Severe aortic stenosis: Patient noted to have loud murmur globally.  TTE with LVEF of 65 to 70%, indeterminate DD and severe aortic stenosis.  Discussed with on-call cardiologist, Dr. Deneise who recommended outpatient follow-up.  Patient is followed at [facility].  Please arrange this follow-up   Chronic low back pain/radiculopathy: Difficult case. Ambulatory dysfunction: Uses wheelchair at baseline.  Recently bedbound due to fall out of wheelchair -Continue home  MSIR, Flexeril, Lyrica , Cymbalta  - PT/OT eval   Adhesive capsulitis of right shoulder: Very limited passive and active range of motion in right shoulder. - Therapy and pain control.   Depression/anxiety/insomnia -Continue home Cymbalta , BuSpar , trazodone /Ativan  and  melatonin.   Obstructive sleep apnea not on CPAP -Minimize avoid sedating medications as much as possible  Polypharmacy: Patient is on multiple sedating medications and remains at risk for confusion and sedation.  We discontinued trazodone  at daughter's request.  Discontinued Compazine  as well.  Strongly recommend fine-tuning of his medication  Body mass index is 28.98 kg/m.           Consultations: None  Time spent 35  minutes  Vital signs Vitals:   10/08/24 0808 10/08/24 1421 10/08/24 2052 10/09/24 0625  BP: (!) 141/91 139/78 117/60 (!) 143/71  Pulse: 81 85 84 84  Temp: 98.2 F (36.8 C) 98.4 F (36.9 C) 98.5 F (36.9 C) 98.5 F (36.9 C)  Resp: 15 16 18 16   Height:      Weight:      SpO2: 98% 98% 100% 97%  TempSrc: Oral Oral Oral   BMI (Calculated):         Discharge exam  GENERAL: No apparent distress.  Nontoxic. HEENT: MMM.  Vision and hearing grossly intact.  NECK: Supple.  No apparent JVD.  RESP:  No IWOB.  Fair aeration bilaterally. CVS:  RRR.  3/6 SEM all over. ABD/GI/GU: BS+. Abd soft, NTND.  MSK/EXT: Limited ROM in right shoulder.  No edema.  SKIN: no apparent skin lesion or wound NEURO: AA.  Oriented x 4.  Limited ROM in right shoulder likely from frozen shoulder.  Otherwise, no apparent focal neuro deficit. PSYCH: Calm. Normal affect.   Discharge Instructions Discharge Instructions     Increase activity slowly   Complete by: As directed       Allergies as of 10/09/2024       Reactions   Cefepime Rash   Redness   Iodinated Contrast Media Dermatitis, Hives, Other (See Comments)   A few hives post CT     Desc: A FEW HIVES POST CT, IODINE   Iohexol  Hives, Other (See Comments)   A few hives post CT   Tositumomab    Fentanyl  Itching, Nausea And Vomiting, Other (See Comments)   Patient states that it happened over 30 yrs ago   Iodine Hives, Rash        Medication List     PAUSE taking these medications    hydroxychloroquine  200 MG  tablet Wait to take this until your doctor or other care provider tells you to start again. Commonly known as: PLAQUENIL  Take 200 mg by mouth 2 (two) times daily.       STOP taking these medications    prochlorperazine  10 MG tablet Commonly known as: COMPAZINE    traZODone  50 MG tablet Commonly known as: DESYREL        TAKE these medications    acetaminophen  500 MG tablet Commonly known as: TYLENOL  Take 2 tablets (1,000 mg total) by mouth every 6 (six) hours as needed for headache, mild pain or fever. What changed: when to take this   albuterol  108 (90 Base) MCG/ACT inhaler Commonly known as: VENTOLIN  HFA Inhale 2 puffs into the lungs every 4 (four) hours as needed for wheezing or shortness of breath.   alum & mag hydroxide-simeth 400-400-40 MG/5ML suspension Commonly known as: MAALOX PLUS Take 30 mLs by mouth every 4 (four) hours as needed for indigestion.  ascorbic acid  500 MG tablet Commonly known as: VITAMIN C  Take 500 mg by mouth daily.   atorvastatin  80 MG tablet Commonly known as: LIPITOR  Take 80 mg by mouth at bedtime.   busPIRone  15 MG tablet Commonly known as: BUSPAR  Take 15 mg by mouth 3 (three) times daily.   Cranberry 450 MG Tabs Take 450 mg by mouth in the morning.   Cyanocobalamin  5000 MCG Tbdp Place 5,000 mcg under the tongue every morning.   cyclobenzaprine  10 MG tablet Commonly known as: FLEXERIL  Take 10 mg by mouth 3 (three) times daily as needed for muscle spasms.   DSS 100 MG Caps Take 100 mg by mouth in the morning and at bedtime.   DULoxetine  60 MG capsule Commonly known as: CYMBALTA  Take 60 mg by mouth daily.   ferrous sulfate  325 (65 FE) MG tablet Take 325 mg by mouth daily.   fluticasone  50 MCG/ACT nasal spray Commonly known as: FLONASE  Place 1 spray into the nose daily.   folic acid  400 MCG tablet Commonly known as: FOLVITE  Take 400 mcg by mouth every morning.   guaiFENesin  100 MG/5ML liquid Commonly known as:  ROBITUSSIN Take 10 mLs by mouth every 4 (four) hours as needed for cough.   lidocaine  4 % Place 1 patch onto the skin in the morning.   Linzess  145 MCG Caps capsule Generic drug: linaclotide  Take 145 mcg by mouth daily.   loratadine  10 MG tablet Commonly known as: CLARITIN  Take 10 mg by mouth in the morning.   LORazepam  0.5 MG tablet Commonly known as: ATIVAN  Take 0.5 mg by mouth 2 (two) times daily.   melatonin 3 MG Tabs tablet Take 3 mg by mouth at bedtime.   morphine  15 MG tablet Commonly known as: MSIR Take 15 mg by mouth every 8 (eight) hours as needed for severe pain (pain score 7-10).   multivitamin with minerals Tabs tablet Take 1 tablet by mouth daily.   NATURAL BALANCE TEARS OP Place 2 drops into both eyes in the morning and at bedtime.   ondansetron  4 MG tablet Commonly known as: ZOFRAN  Take 1 tablet (4 mg total) by mouth every 6 (six) hours as needed for nausea. What changed: Another medication with the same name was removed. Continue taking this medication, and follow the directions you see here.   pantoprazole  40 MG tablet Commonly known as: PROTONIX  Take 1 tablet (40 mg total) by mouth daily.   polyethylene glycol 17 g packet Commonly known as: MIRALAX  / GLYCOLAX  Take 17 g by mouth daily as needed for mild constipation.   pregabalin  75 MG capsule Commonly known as: LYRICA  Take 75 mg by mouth 2 (two) times daily.   sennosides-docusate sodium  8.6-50 MG tablet Commonly known as: SENOKOT-S Take 2 tablets by mouth in the morning and at bedtime.   SUMAtriptan  50 MG tablet Commonly known as: IMITREX  Take 50 mg by mouth every 2 (two) hours as needed for migraine or headache.   tamsulosin  0.4 MG Caps capsule Commonly known as: FLOMAX  Take 0.4 mg by mouth every morning.   thiamine  100 MG tablet Commonly known as: VITAMIN B1 Take 1 tablet (100 mg total) by mouth daily.         Procedures/Studies:   ECHOCARDIOGRAM COMPLETE Result Date:  10/08/2024    ECHOCARDIOGRAM REPORT   Patient Name:   Jorge Sherman Date of Exam: 10/08/2024 Medical Rec #:  [id number]       Height:       70.0 in  Accession #:    [id number]      Weight:       202.0 lb Date of Birth:  10-16-1941       BSA:          2.096 m Patient Age:    83 years        BP:           136/77 mmHg Patient Gender: M               HR:           83 bpm. Exam Location:  Inpatient Procedure: 2D Echo and Intracardiac Opacification Agent (Both Spectral and Color            Flow Doppler were utilized during procedure). Indications:    Murmur  History:        Patient has no prior history of Echocardiogram examinations.  Sonographer:    Charmaine Gaskins Referring Phys: [id number] MIGNON DASEN Maureena Dabbs  Sonographer Comments: No subcostal window and Technically difficult study due to poor echo windows. Supine position and Image acquisition challenging due to respiratory motion. IMPRESSIONS  1. Left ventricular ejection fraction, by estimation, is 65 to 70%. The left ventricle has normal function. Indeterminate diastolic filling due to E-A fusion.  2. Right ventricular systolic function is normal. The right ventricular size is normal.  3. Left atrial size was mildly dilated.  4. Trivial mitral valve regurgitation.  5. AV is thickened, calcified with restricted motion. Peak and mean gradients through the valve are 76 and 52 mm HG respectively AVA (VTI) is 0.81 cm2. Dimensionless valve indext is 0.23 OVerall consistent with severe AS.SABRA The aortic valve has an indeterminant number of cusps. Aortic valve regurgitation is not visualized. FINDINGS  Left Ventricle: Left ventricular ejection fraction, by estimation, is 65 to 70%. The left ventricle has normal function. Definity contrast agent was given IV to delineate the left ventricular endocardial borders. The left ventricular internal cavity size was normal in size. There is no left ventricular hypertrophy. Indeterminate diastolic filling due to E-A fusion. Right Ventricle: The  right ventricular size is normal. Right vetricular wall thickness was not assessed. Right ventricular systolic function is normal. Left [facility]: Left atrial size was mildly dilated. Right [facility]: Right atrial size was normal in size. Pericardium: There is no evidence of pericardial effusion. Mitral Valve: There is mild thickening of the mitral valve leaflet(s). Mild mitral annular calcification. Trivial mitral valve regurgitation. Tricuspid Valve: The tricuspid valve is normal in structure. Tricuspid valve regurgitation is trivial. Aortic Valve: AV is thickened, calcified with restricted motion. Peak and mean gradients through the valve are 76 and 52 mm HG respectively AVA (VTI) is 0.81 cm2. Dimensionless valve indext is 0.23 OVerall consistent with severe AS. The aortic valve has an indeterminant number of cusps. Aortic valve regurgitation is not visualized. Aortic valve mean gradient measures 52.0 mmHg. Aortic valve peak gradient measures 76.4 mmHg. Aortic valve area, by VTI measures 0.87 cm. Pulmonic Valve: The pulmonic valve was normal in structure. Pulmonic valve regurgitation is not visualized. Aorta: The aortic root and ascending aorta are structurally normal, with no evidence of dilitation. IAS/Shunts: No atrial level shunt detected by color flow Doppler.  LEFT VENTRICLE PLAX 2D LVIDd:         4.60 cm LVIDs:         2.70 cm LV PW:         0.90 cm LV IVS:        0.90 cm  LVOT diam:     2.20 cm LV SV:         84 LV SV Index:   40 LVOT Area:     3.80 cm  RIGHT VENTRICLE RV Basal diam:  3.30 cm RV Mid diam:    3.10 cm RV S prime:     14.00 cm/s LEFT ATRIUM           Index        RIGHT ATRIUM           Index LA diam:      4.00 cm 1.91 cm/m   RA Area:     20.10 cm LA Vol (A2C): 61.5 ml 29.34 ml/m  RA Volume:   58.90 ml  28.10 ml/m LA Vol (A4C): 83.8 ml 39.98 ml/m  AORTIC VALVE AV Area (Vmax):    0.89 cm AV Area (Vmean):   0.82 cm AV Area (VTI):     0.87 cm AV Vmax:           437.00 cm/s AV Vmean:           354.000 cm/s AV VTI:            0.967 m AV Peak Grad:      76.4 mmHg AV Mean Grad:      52.0 mmHg LVOT Vmax:         102.25 cm/s LVOT Vmean:        76.800 cm/s LVOT VTI:          0.221 m LVOT/AV VTI ratio: 0.23  AORTA Ao Root diam: 3.40 cm Ao Asc diam:  3.00 cm  SHUNTS Systemic VTI:  0.22 m Systemic Diam: 2.20 cm Vina Gull MD Electronically signed by Vina Gull MD Signature Date/Time: 10/08/2024/2:39:34 PM    Final    DG Chest Portable 1 View Result Date: 10/06/2024 EXAM: 1 VIEW(S) XRAY OF THE CHEST 10/06/2024 08:13:00 PM COMPARISON: None available. CLINICAL HISTORY: ?pna pna FINDINGS: LUNGS AND PLEURA: Lungs are extremely small and full insufflation has diminished since prior examination. No focal pulmonary opacity. No pleural effusion. No pneumothorax. HEART AND MEDIASTINUM: No acute abnormality of the cardiac and mediastinal silhouettes. BONES AND SOFT TISSUES: Osseous structures are age appropriate with advanced degenerative changes noted within the shoulders bilaterally. IMPRESSION: 1. No acute process. 2. Pulmonary hypoinflation Electronically signed by: Dorethia Molt MD 10/06/2024 08:45 PM EST RP Workstation: [id number]       The results of significant diagnostics from this hospitalization (including imaging, microbiology, ancillary and laboratory) are listed below for reference.     Microbiology: Recent Results (from the past 240 hours)  Urine Culture     Status: Abnormal   Collection Time: 10/06/24  8:28 PM   Specimen: Urine, Random  Result Value Ref Range Status   Specimen Description   Final    URINE, RANDOM Performed at [facility], [address], [place], [place] [address]    Special Requests   Final    NONE Reflexed from [id number] Performed at [facility], [address] [place], [place] [address]    Culture MULTIPLE SPECIES PRESENT, SUGGEST RECOLLECTION (A)  Final   Report Status 10/08/2024 FINAL  Final  Resp panel by RT-PCR (RSV,  Flu A&B, Covid) Anterior Nasal Swab     Status: None   Collection Time: 10/06/24  9:09 PM   Specimen: Anterior Nasal Swab  Result Value Ref Range Status   SARS Coronavirus 2 by RT PCR NEGATIVE NEGATIVE Final  Comment: (NOTE) SARS-CoV-2 target nucleic acids are NOT DETECTED.  The SARS-CoV-2 RNA is generally detectable in upper respiratory specimens during the acute phase of infection. The lowest concentration of SARS-CoV-2 viral copies this assay can detect is 138 copies/mL. A negative result does not preclude SARS-Cov-2 infection and should not be used as the sole basis for treatment or other patient management decisions. A negative result may occur with  improper specimen collection/handling, submission of specimen other than nasopharyngeal swab, presence of viral mutation(s) within the areas targeted by this assay, and inadequate number of viral copies(<138 copies/mL). A negative result must be combined with clinical observations, patient history, and epidemiological information. The expected result is Negative.  Fact Sheet for Patients:  bloggercourse.com  Fact Sheet for Healthcare Providers:  seriousbroker.it  This test is no t yet approved or cleared by the [place]  FDA and  has been authorized for detection and/or diagnosis of SARS-CoV-2 by FDA under an Emergency Use Authorization (EUA). This EUA will remain  in effect (meaning this test can be used) for the duration of the COVID-19 declaration under Section 564(b)(1) of the Act, 21 U.S.C.section 360bbb-3(b)(1), unless the authorization is terminated  or revoked sooner.       Influenza A by PCR NEGATIVE NEGATIVE Final   Influenza B by PCR NEGATIVE NEGATIVE Final    Comment: (NOTE) The Xpert Xpress SARS-CoV-2/FLU/RSV plus assay is intended as an aid in the diagnosis of influenza from Nasopharyngeal swab specimens and should not be used as a sole basis for  treatment. Nasal washings and aspirates are unacceptable for Xpert Xpress SARS-CoV-2/FLU/RSV testing.  Fact Sheet for Patients: bloggercourse.com  Fact Sheet for Healthcare Providers: seriousbroker.it  This test is not yet approved or cleared by the [place]  FDA and has been authorized for detection and/or diagnosis of SARS-CoV-2 by FDA under an Emergency Use Authorization (EUA). This EUA will remain in effect (meaning this test can be used) for the duration of the COVID-19 declaration under Section 564(b)(1) of the Act, 21 U.S.C. section 360bbb-3(b)(1), unless the authorization is terminated or revoked.     Resp Syncytial Virus by PCR NEGATIVE NEGATIVE Final    Comment: (NOTE) Fact Sheet for Patients: bloggercourse.com  Fact Sheet for Healthcare Providers: seriousbroker.it  This test is not yet approved or cleared by the [place]  FDA and has been authorized for detection and/or diagnosis of SARS-CoV-2 by FDA under an Emergency Use Authorization (EUA). This EUA will remain in effect (meaning this test can be used) for the duration of the COVID-19 declaration under Section 564(b)(1) of the Act, 21 U.S.C. section 360bbb-3(b)(1), unless the authorization is terminated or revoked.  Performed at [facility], [address] [place], [place] [address]      Labs:  CBC: Recent Labs  Lab 10/06/24 2037 10/07/24 0057 10/08/24 1102  WBC 2.6* 2.4* 1.6*  NEUTROABS 1.0*  --   --   HGB 7.4* 7.4* 8.2*  HCT 23.7* 23.7* 25.5*  MCV 91.2 92.6 89.5  PLT 120* 106* 107*   BMP &GFR Recent Labs  Lab 10/06/24 2037 10/07/24 0057 10/08/24 1102  NA 136 138 137  K 4.4 3.9 3.9  CL 98 100 100  CO2 31 30 29   GLUCOSE 108* 103* 115*  BUN 24* 21 14  CREATININE 1.03 0.94 0.85  CALCIUM  9.1 8.9 9.6  MG  --   --  2.3  PHOS  --   --  3.1   Estimated Creatinine  Clearance: 74.9 mL/min (by  C-G formula based on SCr of 0.85 mg/dL). Liver & Pancreas: Recent Labs  Lab 10/07/24 0057 10/08/24 1102  AST 16  --   ALT 6  --   ALKPHOS 62  --   BILITOT 0.5  --   PROT 6.2*  --   ALBUMIN 3.4* 3.6   No results for input(s): LIPASE, AMYLASE in the last 168 hours. No results for input(s): AMMONIA in the last 168 hours. Diabetic: Recent Labs    10/07/24 0057  HGBA1C 5.6   Recent Labs  Lab 10/08/24 0810 10/08/24 1202 10/08/24 1656 10/08/24 2048 10/09/24 0810  GLUCAP 111* 165* 114* 176* 99   Cardiac Enzymes: No results for input(s): CKTOTAL, CKMB, CKMBINDEX, TROPONINI in the last 168 hours. No results for input(s): PROBNP in the last 8760 hours. Coagulation Profile: No results for input(s): INR, PROTIME in the last 168 hours. Thyroid  Function Tests: Recent Labs    10/07/24 0057  TSH 1.970   Lipid Profile: No results for input(s): CHOL, HDL, LDLCALC, TRIG, CHOLHDL, LDLDIRECT in the last 72 hours. Anemia Panel: No results for input(s): VITAMINB12, FOLATE, FERRITIN, TIBC, IRON, RETICCTPCT in the last 72 hours. Urine analysis:    Component Value Date/Time   COLORURINE YELLOW 10/06/2024 2028   APPEARANCEUR CLEAR 10/06/2024 2028   LABSPEC 1.015 10/06/2024 2028   PHURINE 5.0 10/06/2024 2028   GLUCOSEU NEGATIVE 10/06/2024 2028   HGBUR NEGATIVE 10/06/2024 2028   BILIRUBINUR NEGATIVE 10/06/2024 2028   KETONESUR NEGATIVE 10/06/2024 2028   PROTEINUR NEGATIVE 10/06/2024 2028   NITRITE NEGATIVE 10/06/2024 2028   LEUKOCYTESUR TRACE (A) 10/06/2024 2028   Sepsis Labs: Invalid input(s): PROCALCITONIN, LACTICIDVEN   SIGNED:  Heena Woodbury T Joyel Chenette, MD  [facility] 10/09/2024, 10:39 AM

## 2024-10-09 NOTE — Progress Notes (Signed)
 All questions and concerns were answered prior to discharge. Patient will be returning back to [facility]. Report given to nurse Rosaline.

## 2024-10-09 NOTE — TOC Progression Note (Addendum)
 Transition of Care [facility]) - Progression Note    Patient Details  Name: Jorge Sherman MRN: [id number] Date of Birth: 1941/10/17  [facility] CM/SW Contact  Alfonse JONELLE Rex, RN Phone Number: 10/09/2024, 2:13 PM  Clinical Narrative:   Call to Soy , admit coordinator w[facility], states they will accept patient back today since patient is Medicaid and already a long term resident. Met with patient at bedside, updated on transfer back to [facility] today, patient agreeable. Call to patient's daughter, [name], to notify of discharge, no answer, vmml with NCM name and phone number for call back   -2:10pm Call from Jenita w/[employer], offer for Peer to Peer for SNF request, due by 10/10/24 at 9:30amEST, phone [phone], opt 5, attending notified.    Expected Discharge Plan: Skilled Nursing Facility Barriers to Discharge: Continued Medical Work up               Expected Discharge Plan and Services       Living arrangements for the past 2 months: Skilled Nursing Facility Expected Discharge Date: 10/09/24                                     Social Drivers of Health (SDOH) Interventions SDOH Screenings   Food Insecurity: No Food Insecurity (10/07/2024)  Housing: Low Risk  (10/07/2024)  Transportation Needs: No Transportation Needs (10/07/2024)  Utilities: Not At Risk (10/07/2024)  Depression (PHQ2-9): Low Risk  (09/05/2024)  Financial Resource Strain: Low Risk  (11/01/2022)   Received from [facility]  Social Connections: Socially Isolated (10/07/2024)  Stress: No Stress Concern Present (11/01/2022)   Received from [facility]  Tobacco Use: Medium Risk (10/06/2024)    Readmission Risk Interventions    10/07/2024   10:42 AM  Readmission Risk Prevention Plan  Transportation Screening Complete  PCP or Specialist Appt within 3-5 Days Complete  HRI or Home Care Consult Complete  Social Work Consult for Recovery Care Planning/Counseling Complete   Palliative Care Screening Not Applicable  Medication Review Oceanographer) Complete

## 2024-10-10 NOTE — TOC Transition Note (Signed)
 Transition of Care [facility]) - Discharge Note   Patient Details  Name: Jorge Sherman MRN: [id number] Date of Birth: Feb 08, 1941  Transition of Care [facility]) CM/SW Contact:  Alfonse JONELLE Rex, RN Phone Number: 10/10/2024, 3:06 PM   Clinical Narrative:   LATE ENTRY : Call received from Jocelyn with [employer] at 11:31am, reports denial of request for short term rehab/skilled nursing, Appeals information: phone [phone], Fax [phone], denial and appeal information text to patient's daughter, Zephaniah Lubrano, at phone number on file in [facility].     Final next level of care: Skilled Nursing Facility Barriers to Discharge: Barriers Resolved   Patient Goals and CMS Choice Patient states their goals for this hospitalization and ongoing recovery are:: return to [name]          Discharge Placement                Patient to be transferred to facility by: [facility] Name of family member notified: Shedrick Sarli, daughter Patient and family notified of of transfer: 10/09/24  Discharge Plan and Services Additional resources added to the After Visit Summary for                                       Social Drivers of Health (SDOH) Interventions SDOH Screenings   Food Insecurity: No Food Insecurity (10/07/2024)  Housing: Low Risk  (10/07/2024)  Transportation Needs: No Transportation Needs (10/07/2024)  Utilities: Not At Risk (10/07/2024)  Depression (PHQ2-9): Low Risk  (09/05/2024)  Financial Resource Strain: Low Risk  (11/01/2022)   Received from [facility]  Social Connections: Socially Isolated (10/07/2024)  Stress: No Stress Concern Present (11/01/2022)   Received from [facility]  Tobacco Use: Medium Risk (10/06/2024)     Readmission Risk Interventions    10/07/2024   10:42 AM  Readmission Risk Prevention Plan  Transportation Screening Complete  PCP or Specialist Appt within 3-5 Days Complete  HRI or Home Care Consult Complete  Social Work Consult for  Recovery Care Planning/Counseling Complete  Palliative Care Screening Not Applicable  Medication Review Oceanographer) Complete

## 2024-10-15 NOTE — ED Provider Notes (Signed)
 Emergency Department Provider Note  Chief Complaint  Patient presents with  . Abnormal Lab   History  *Chart reviewed and pertinent medical history confirmed with patient and/or external source collateral (as needed).  Jorge Sherman is a 83 y.o. male w/ h/o of myelodysplastic syndrome, CAD, HTN, HLN, T2DM, GERD, BPH, presenting to the ED for low hemoglobin, onset today. Patient endorses feeling like his blood count is low, as he has recently been tired, lethargic, and dizzy. Patient felt like he was anemic and is conscious of when he is anemic, so staff at [name] called EMS to have the patient evaluated at this ED. Patient repots that he is not anemic due to blood loss, but endorses a history of bone marrow cancer that causes his anemia. Patient report that he is followed by his oncologist hematologist Dr. Gatha at [facility]. Patient denies any vomiting, hematemesis, diarrhea, black stools, bloody stools, chest pain, or shortness of breath.  Previous Chart Review  Reviewed CBC with differential from 09/25/2024 that showed the patient had a hemoglobin of 7.9, hematocrit of 23.8, WBC of 1.60, RBC of 2.74. Also reviewed [place] hospital discharge summary from 10/08/2024 for myelodysplastic syndrome.   Past Medical History Medical History[1]  Past Surgical History Surgical History[2]   Medications Current Outpatient Medications  Medication Instructions  . acetaminophen  (TYLENOL ) 1,000 mg, oral, Every 8 hours PRN  . ascorbic acid  (VITAMIN C ) 500 mg, 3 times daily  . atorvastatin  (LIPITOR ) 80 mg tablet 1 tablet, Daily  . busPIRone  (BUSPAR ) 15 mg, 3 times daily  . cranberry 450 mg, Daily  . cyanocobalamin , vitamin B-12, 5,000 mcg TbDL 1 tablet, Daily  . cyclobenzaprine  (FLEXERIL ) 10 mg, 3 times daily  . dextran 70-hypromellose, PF, 0.1-0.3 % ophthalmic solution 2 drops, 2 times daily  . docusate sodium  (COLACE) 100 mg, 2 times daily  . DULoxetine  (CYMBALTA ) 60 mg, oral,  Daily  . ferrous sulfate  325 mg, 3 times daily with meals  . fluticasone  propionate (FLONASE ) 50 mcg/spray nasal spray 1 spray, Daily  . folic acid  (FOLVITE ) 400 mcg, Daily  . guaiFENesin  (ROBITUSSIN) 100 mg/5 mL syrup 10 mL, Every 4 hours PRN  . [Paused] hydroxychloroquine  (PLAQUENIL ) 200 mg, 2 times daily  . lidocaine  (SALONPAS) 4 % patch 1 patch, topical, Daily  . Linzess  145 mcg, Daily  . loratadine  (CLARITIN ) 10 mg, Daily  . LORazepam  (ATIVAN ) 0.5 mg, 2 times daily  . melatonin 3 mg, oral, every evening (melatonin)  . morphine  (MSIR) 15 mg, Every 8 hours PRN  . multivitamin with folic acid  400 mcg tab 1 tablet, oral, Daily  . pantoprazole  (PROTONIX ) 40 mg, 2 times daily  . polyethylene glycol (GLYCOLAX ) 17 g, Daily  . pregabalin  (LYRICA ) 50 mg, oral, 2 times daily  . tamsulosin  (FLOMAX ) 0.4 mg, Daily  . thiamine  (VITAMIN B1) 100 mg, oral, Daily  . traZODone  (DESYREL ) 50 mg, oral, Nightly   Allergies Allergies[3]  Family History Family History[4]  Social History Social History[5] Physical Exam   Vitals:   10/15/24 1113 10/15/24 1200  BP: (!) 112/52 111/63  BP Location: Left arm   Patient Position: Sitting   Pulse: 84 84  Resp: 18 15  Temp: 98.1 F (36.7 C)   TempSrc: Oral   SpO2: 94% 96%  Weight: 93.9 kg (207 lb 0.2 oz)   Height: 180.3 cm (5' 11)     HEENT: Mildly pale conjunctiva, normocephalic, supple w/o meningismus, pupils equal/round/reactive, extraocular movements intact Pulmonary: normal respiratory effort, CTAB, no  stridor Cardiovascular: normal rate/rhythm, no audible murmurs Abdominal: soft, non-tender, non-distended, no guarding/rebound Extremities: chronic discoloration to bilateral lower extremities, warm, dry, no pitting edema, 2+ pulses in b/l lower extremities Neurologic: A&O x3, pupils cross midline, moving all extremities   Medical Decision Making (MDM)   Differential Diagnosis DDX Weakness:  Anemia, metabolic disorder, electrolyte  imbalance, medication adverse effect, medication side effect, ACS, infection, sepsis, endocrine disorder, dehydration, CVA     ED Interventions and Subspecialty Consults Orders Placed This Encounter  Procedures  . Critical Care  . XR Chest 1 View  . CBC with Differential  . aPTT  . Prothrombin Time (PT) with INR  . Type and screen  . CBC with Differential  . Troponin, High Sensitive (0 Hr + 2 Hr Rfx)  . Procalcitonin  . Comprehensive Metabolic Panel  . Troponin, High Sensitive (2 Hr Rfx)  . PRN adapter/PIV  . Notify provider (specify)  . ED Monitor with Pulse Ox  . Nursing communication May Transport Off Monitor  . Verify informed consent  . Vital Signs  . Inpatient consult to Hospitalist  . POC Hemoccult Screen (Guaiac)  . ECG 12 lead  . Prepare Red Blood Cells  : 1 Units  . Prepare RBC (Sunquest Info)    New Medications Ordered This Visit  Medications  . sodium chloride  0.9 % infusion    Procedures Critical Care  Performed by: Miquel Lax, MD Authorized by: Miquel Lax, MD   Critical care provider statement:    Critical care time (minutes):  40   Critical care time was exclusive of:  Separately billable procedures and treating other patients   Critical care was necessary to treat or prevent imminent or life-threatening deterioration of the following conditions:  Circulatory failure   Critical care was time spent personally by me on the following activities:  Development of treatment plan with patient or surrogate, discussions with consultants, evaluation of patient's response to treatment, examination of patient, obtaining history from patient or surrogate, ordering and performing treatments and interventions, ordering and review of laboratory studies, ordering and review of radiographic studies, re-evaluation of patient's condition and review of old charts   I assumed direction of critical care for this patient from another provider in my specialty: no     Care  discussed with: admitting provider        Independent Interpretation  *All imaging, laboratory, and diagnostic studies reviewed and independently interpreted by me, pertinent findings outlined below.   Lab and Imaging Interpretation : I have independently reviewed the following labs and imaging and my interpretation is as follows initial troponin 10, hemoglobin 6.8, white count 1.6, neutropenic, CMP with mild low protein, x-ray with interstitial edema  EKG Interpretation: My interpretation of the EKG is as follows rate 84, sinus, first-degree AV block, left axis deviation, no STEMI  Cardiac Monitoring Interpretation: no acute distress, no tachycardia, no tachypnea, no hypotension     83 y.o. male with PMH as stated above presenting to the ED for fatigue, low hemoglobin.ED evaluation and interventions as outlined above.  Well-appearing male, no acute respiratory distress.  Here with fatigue, shortness of breath and found to be anemic.  No signs of acute blood loss via GI bleed at this time.  Mild pale conjunctiva.  Speaking full sentences, ANO x 3.  Consented for 1 unit of blood.  Admitted to medicine for anemia ED Course as of 10/15/24 1331  Tue Oct 15, 2024  1253 History of CAD, hyperlipidemia, diabetes, GERD, BPH,  anemia and myelodysplastic syndrome. Previous transfusions. Hemoglobin 6.8. Consented for 1 unit  [HN]    ED Course User Index [HN] Miquel Lax, MD    Dispo as outlined below.  Overall Clinical Impression:  1. Anemia, unspecified type   2. Myelodysplastic disease    (CMD)   3. Neutropenia, unspecified type    ED Disposition  Admit  Clinical Complexity Past medical/surgical history of myelodysplastic increasing complexity of ED encounter outlined above in HPI. Factors Impacting ED Encounter Risk: Discussion regarding hospitalization, Discussion regarding prescription drug management , and Access to health care services. Patient presentation most consistent with acute  presentation with potential threat to life or bodily function.    *Provider time spent in patient care today, inclusive of (but not limited to) review of medical records, real-time clinical reassessment, review of diagnostic studies, and discharge preparation, was greater than 30 minutes.  *This document was created using the aid of voice recognition Dragon dictation software.  This document serves as a record of services personally performed by Miquel Lax, MD. It was created on their behalf by Karena LOISE Hurst, Scribe, a trained medical scribe. The creation of this record is the provider's dictation and/or activities during the visit.   Electronically signed by: Karena LOISE Hurst, Scribe 10/15/2024 1:31 PM       [1] Past Medical History: Diagnosis Date  . Anemia   . Arthritis   . BPH (benign prostatic hyperplasia)   . Coronary artery disease   . Depression   . Diabetes mellitus (CMD)   . Diabetes mellitus type II, controlled (CMD)    patient states per Dr. Carlo Hora that he is not diabetic  . Difficulty swallowing   . GERD (gastroesophageal reflux disease)   . High cholesterol   . Hypertension   . Kidney stone    history  . MRSA (methicillin resistant staph aureus) culture positive 08/10/2018   nasal swab  . OSA (obstructive sleep apnea)    does not use CPAP  [2] Past Surgical History: Procedure Laterality Date  . BACK SURGERY     Procedure: BACK SURGERY; 3x's, pt unsure of type   . BACK SURGERY     Procedure: BACK SURGERY  . CARDIAC CATHETERIZATION     Procedure: CARDIAC CATHETERIZATION  . OTHER SURGICAL HISTORY     Procedure: OTHER SURGICAL HISTORY (pain stimulator ); Implanted 2009  . POSTERIOR LAMINECTOMY / DECOMPRESSION LUMBAR SPINE N/A 08/14/2018   Procedure: L2-S1 Revision Laminectomy And Fusion, Removal of Hardware and Exploration of Fusion, Allograft;  Surgeon: Royden Elsie Schneider, MD;  Location: [facility] MAIN OR;  Service: Orthopedics;  Laterality: N/A;  Prone,  Stim Neuromonitoring, O-Arm, [employer], Cell Saver, ProAxis, [employer], Justin, PACS on [facility]  [3] Allergies Allergen Reactions  . Cefepime Rash and Other (See Comments)  . Gabapentin  Mental Status Changes    Daughter reports altered now, use to take and no longer tolerates?  SABRA Iodine Hives  . Iohexol  Hives     Desc: A FEW HIVES POST CT, IODINE  . Tositumomab   . Trazodone  Mental Status Changes    Daughter reports- states mental status changes   . Fentanyl  GI Intolerance and Itching    Patient states that it happened over 30 yrs ago  . Iodine Rash  [4] Family History Problem Relation Name Age of Onset  . Cancer Father    [5] Social History Tobacco Use  . Smoking status: Former    Current packs/day: 0.00    Types: Cigarettes  Quit date: 08/10/1988    Years since quitting: 36.2  . Smokeless tobacco: Never  Substance Use Topics  . Alcohol  use: No  . Drug use: No    Comment: Drug use: Denies

## 2024-10-16 ENCOUNTER — Telehealth: Payer: Self-pay

## 2024-10-16 NOTE — Progress Notes (Signed)
 Case Management Screening  CSN: [id number] DOB: [birth year] Service: General Medicine Location: 735/01   Initial Screening Readmission Risk Score v2: 30.5 Risk Level: High - Patient meets high risk criteria for post hospital services. Assessment to be completed. > 20% Readmission Risk Score: Yes Patient has new or exacerbation of functional deficits:: Yes History of multiple unscheduled readmission or hospitalization, including re-admission with 30 days:: Yes (Dcd from [facility] 11/18) Patient from post-acute care facility: Yes ([facility])    Deliliah JINNY Bernheim, RN

## 2024-10-16 NOTE — Telephone Encounter (Signed)
 Received call from physician at [facility] reporting that patient was admitted and is stable for discharge today. Physician requested Dr. Jeannett recommendations regarding patient's neutropenia.  Updated Dr. [name] on patient's most recent CBC results: WBC 1.74 and ANC 0.8. Per Dr. [name], if patient had a UTI during hospitalization, ensure that it was appropriately treated. As long as patient remains afebrile, no antifungal or antibacterial therapy is required at this time. Plan is to continue weekly lab monitoring.  Attempted to contact provider at [facility] to relay Dr. Jeannett recommendations. Left voicemail requesting return call with any additional questions or concerns.

## 2024-10-23 ENCOUNTER — Encounter: Payer: Self-pay | Admitting: Internal Medicine

## 2024-10-24 ENCOUNTER — Encounter: Payer: Self-pay | Admitting: Internal Medicine

## 2024-10-24 ENCOUNTER — Telehealth: Payer: Self-pay

## 2024-10-24 NOTE — Telephone Encounter (Signed)
 Received call from [facility] provider in regards to pts

## 2024-10-24 NOTE — Telephone Encounter (Signed)
 Left message with provider at [facility] regarding Dr Jeannett recommendation to d/c pt from hospital and pt will follow up in our office Monday.

## 2024-10-25 NOTE — Progress Notes (Deleted)
 Jorge Sherman

## 2024-10-28 ENCOUNTER — Inpatient Hospital Stay: Attending: Internal Medicine

## 2024-10-28 ENCOUNTER — Inpatient Hospital Stay: Admitting: Internal Medicine

## 2024-10-28 ENCOUNTER — Encounter: Payer: Self-pay | Admitting: Internal Medicine

## 2024-10-28 ENCOUNTER — Other Ambulatory Visit: Payer: Self-pay | Admitting: Urology

## 2024-10-28 ENCOUNTER — Inpatient Hospital Stay

## 2024-10-28 VITALS — BP 135/75 | HR 89 | Temp 98.3°F | Resp 16

## 2024-10-28 VITALS — BP 111/80 | HR 89 | Temp 97.6°F | Resp 17 | Ht 70.0 in | Wt 201.0 lb

## 2024-10-28 DIAGNOSIS — Z91041 Radiographic dye allergy status: Secondary | ICD-10-CM | POA: Diagnosis not present

## 2024-10-28 DIAGNOSIS — Z885 Allergy status to narcotic agent status: Secondary | ICD-10-CM | POA: Diagnosis not present

## 2024-10-28 DIAGNOSIS — Z5111 Encounter for antineoplastic chemotherapy: Secondary | ICD-10-CM | POA: Diagnosis present

## 2024-10-28 DIAGNOSIS — Z87440 Personal history of urinary (tract) infections: Secondary | ICD-10-CM | POA: Diagnosis not present

## 2024-10-28 DIAGNOSIS — Z881 Allergy status to other antibiotic agents status: Secondary | ICD-10-CM | POA: Diagnosis not present

## 2024-10-28 DIAGNOSIS — Z7964 Long term (current) use of myelosuppressive agent: Secondary | ICD-10-CM | POA: Insufficient documentation

## 2024-10-28 DIAGNOSIS — J984 Other disorders of lung: Secondary | ICD-10-CM | POA: Diagnosis not present

## 2024-10-28 DIAGNOSIS — M19011 Primary osteoarthritis, right shoulder: Secondary | ICD-10-CM | POA: Diagnosis not present

## 2024-10-28 DIAGNOSIS — M19012 Primary osteoarthritis, left shoulder: Secondary | ICD-10-CM | POA: Insufficient documentation

## 2024-10-28 DIAGNOSIS — D469 Myelodysplastic syndrome, unspecified: Secondary | ICD-10-CM

## 2024-10-28 DIAGNOSIS — Z888 Allergy status to other drugs, medicaments and biological substances status: Secondary | ICD-10-CM | POA: Diagnosis not present

## 2024-10-28 DIAGNOSIS — Z79899 Other long term (current) drug therapy: Secondary | ICD-10-CM | POA: Diagnosis not present

## 2024-10-28 LAB — CBC WITH DIFFERENTIAL (CANCER CENTER ONLY)
Abs Immature Granulocytes: 0.01 K/uL (ref 0.00–0.07)
Basophils Absolute: 0 K/uL (ref 0.0–0.1)
Basophils Relative: 0 %
Eosinophils Absolute: 0 K/uL (ref 0.0–0.5)
Eosinophils Relative: 0 %
HCT: 28.1 % — ABNORMAL LOW (ref 39.0–52.0)
Hemoglobin: 9.1 g/dL — ABNORMAL LOW (ref 13.0–17.0)
Immature Granulocytes: 0 %
Lymphocytes Relative: 45 %
Lymphs Abs: 1.1 K/uL (ref 0.7–4.0)
MCH: 28.6 pg (ref 26.0–34.0)
MCHC: 32.4 g/dL (ref 30.0–36.0)
MCV: 88.4 fL (ref 80.0–100.0)
Monocytes Absolute: 0.1 K/uL (ref 0.1–1.0)
Monocytes Relative: 3 %
Neutro Abs: 1.3 K/uL — ABNORMAL LOW (ref 1.7–7.7)
Neutrophils Relative %: 52 %
Platelet Count: 105 K/uL — ABNORMAL LOW (ref 150–400)
RBC: 3.18 MIL/uL — ABNORMAL LOW (ref 4.22–5.81)
RDW: 14.6 % (ref 11.5–15.5)
WBC Count: 2.5 K/uL — ABNORMAL LOW (ref 4.0–10.5)
nRBC: 0 % (ref 0.0–0.2)

## 2024-10-28 LAB — BASIC METABOLIC PANEL - CANCER CENTER ONLY
Anion gap: 9 (ref 5–15)
BUN: 22 mg/dL (ref 8–23)
CO2: 26 mmol/L (ref 22–32)
Calcium: 9.3 mg/dL (ref 8.9–10.3)
Chloride: 102 mmol/L (ref 98–111)
Creatinine: 0.98 mg/dL (ref 0.61–1.24)
GFR, Estimated: >60 mL/min (ref >60)
Glucose, Bld: 135 mg/dL — ABNORMAL HIGH (ref 70–99)
Potassium: 4.2 mmol/L (ref 3.5–5.1)
Sodium: 136 mmol/L (ref 135–145)

## 2024-10-28 LAB — SAMPLE TO BLOOD BANK

## 2024-10-28 MED ORDER — AZACITIDINE CHEMO SQ INJECTION
75.0000 mg/m2 | Freq: Once | INTRAMUSCULAR | Status: AC
  Administered 2024-10-28: 162.5 mg via SUBCUTANEOUS
  Filled 2024-10-28: qty 6.5

## 2024-10-28 MED ORDER — ONDANSETRON HCL 8 MG PO TABS
8.0000 mg | ORAL_TABLET | Freq: Once | ORAL | Status: AC
  Administered 2024-10-28: 8 mg via ORAL
  Filled 2024-10-28: qty 1

## 2024-10-28 NOTE — Patient Instructions (Signed)
 [facility] - A DEPT OF [facility]  Discharge Instructions: Thank you for choosing [facility] to provide your oncology and hematology care.   If you have a lab appointment with the Cancer Center, please go directly to the Cancer Center and check in at the registration area.   Wear comfortable clothing and clothing appropriate for easy access to any Portacath or PICC line.   We strive to give you quality time with your provider. You may need to reschedule your appointment if you arrive late (15 or more minutes).  Arriving late affects you and other patients whose appointments are after yours.  Also, if you miss three or more appointments without notifying the office, you may be dismissed from the clinic at the provider's discretion.      For prescription refill requests, have your pharmacy contact our office and allow 72 hours for refills to be completed.    Today you received the following chemotherapy and/or immunotherapy agents: Vidaza      To help prevent nausea and vomiting after your treatment, we encourage you to take your nausea medication as directed.  BELOW ARE SYMPTOMS THAT SHOULD BE REPORTED IMMEDIATELY: *FEVER GREATER THAN 100.4 F (38 C) OR HIGHER *CHILLS OR SWEATING *NAUSEA AND VOMITING THAT IS NOT CONTROLLED WITH YOUR NAUSEA MEDICATION *UNUSUAL SHORTNESS OF BREATH *UNUSUAL BRUISING OR BLEEDING *URINARY PROBLEMS (pain or burning when urinating, or frequent urination) *BOWEL PROBLEMS (unusual diarrhea, constipation, pain near the anus) TENDERNESS IN MOUTH AND THROAT WITH OR WITHOUT PRESENCE OF ULCERS (sore throat, sores in mouth, or a toothache) UNUSUAL RASH, SWELLING OR PAIN  UNUSUAL VAGINAL DISCHARGE OR ITCHING   Items with * indicate a potential emergency and should be followed up as soon as possible or go to the Emergency Department if any problems should occur.  Please show the CHEMOTHERAPY ALERT CARD or IMMUNOTHERAPY  ALERT CARD at check-in to the Emergency Department and triage nurse.  Should you have questions after your visit or need to cancel or reschedule your appointment, please contact [facility] - A DEPT OF [facility]  Dept: [phone]  and follow the prompts.  Office hours are 8:00 a.m. to 4:30 p.m. Monday - Friday. Please note that voicemails left after 4:00 p.m. may not be returned until the following business day.  We are closed weekends and major holidays. You have access to a nurse at all times for urgent questions. Please call the main number to the clinic Dept: [phone] and follow the prompts.   For any non-urgent questions, you may also contact your provider using MyChart. We now offer e-Visits for anyone 5 and older to request care online for non-urgent symptoms. For details visit [email or web address].   Also download the MyChart app! Go to the app store, search "MyChart", open the app, select [facility], and log in with your MyChart username and password.

## 2024-10-28 NOTE — Progress Notes (Signed)
 [facility] [phone]   [phone]  OFFICE PROGRESS NOTE  Jorge Sherman LABOR, MD [address] [place] [address]  DIAGNOSIS: Myelodysplastic syndrome with excess blast (MDS-ES-I diagnosed in September 2025. IPSS-M: High risk (5.79)  PRIOR THERAPY: None  CURRENT THERAPY: Azacitidine  75 mg/M2 subcutaneously on days 1-5 as well as 8 and 9 every 4 weeks.  Status post 2 cycles.  Starting from cycle #3 of his treatment will be only for days 1-5 every 4 weeks.  INTERVAL HISTORY: Jorge Sherman 83 y.o. male returns to the clinic today for hospital follow-up visit.Discussed the use of AI scribe software for clinical note transcription with the patient, who gave verbal consent to proceed.  History of Present Illness Jorge Sherman is an 83 year old male with myelodysplastic syndrome with excess blasts who presents for evaluation before starting cycle number three of chemotherapy.  He has a history of myelodysplastic syndrome with excess blasts, high risk, diagnosed in September 2025. He is currently undergoing treatment with Vidaza  (azacitidine ) at a dose of 75 mg/m2 on days 1-5 and 8-9 every four weeks. He has completed two cycles of this regimen and is here for evaluation before starting the third cycle.  He was recently hospitalized for a urinary tract infection (UTI) and has been experiencing recurrent UTIs. He is currently on an antibiotic regimen that he takes once a week to manage these infections. When he gets a UTI, it can become severe to the point of delirium. He has been receiving care from [facility] for this issue.  His current medications include the chemotherapy regimen and an unspecified antibiotic for UTI management. He resides at [facility], a rehabilitation facility, which he describes as a well-maintained and pleasant environment. He has discussed his health and future with his children, emphasizing the  importance of being honest about his condition.    MEDICAL HISTORY: Past Medical History:  Diagnosis Date   Chronic pain    Depressive disorder, not elsewhere classified    Dizziness and giddiness    Essential and other specified forms of tremor    Headache(784.0)    Leukocytosis, unspecified    Obstructive sleep apnea (adult) (pediatric)    Pain in joint, lower leg    Septic joint of left knee joint (HCC) 04/22/2019   Spinal stenosis, lumbar region, without neurogenic claudication    Thoracic or lumbosacral neuritis or radiculitis, unspecified    Type II or unspecified type diabetes mellitus without mention of complication, not stated as uncontrolled    Unspecified cardiovascular disease    Unspecified essential hypertension     ALLERGIES:  is allergic to cefepime, iodinated contrast media, iohexol , tositumomab, fentanyl , and iodine.  MEDICATIONS:  Current Outpatient Medications  Medication Sig Dispense Refill   acetaminophen  (TYLENOL ) 500 MG tablet Take 2 tablets (1,000 mg total) by mouth every 6 (six) hours as needed for headache, mild pain or fever. (Patient taking differently: Take 1,000 mg by mouth every 8 (eight) hours as needed for headache, mild pain (pain score 1-3) or fever.) 30 tablet 0   albuterol  (VENTOLIN  HFA) 108 (90 Base) MCG/ACT inhaler Inhale 2 puffs into the lungs every 4 (four) hours as needed for wheezing or shortness of breath. 1 each 0   alum & mag hydroxide-simeth (MAALOX PLUS) 400-400-40 MG/5ML suspension Take 30 mLs by mouth every 4 (four) hours as needed for indigestion.     ascorbic acid  (VITAMIN C ) 500 MG tablet  Take 500 mg by mouth daily.     atorvastatin  (LIPITOR ) 80 MG tablet Take 80 mg by mouth at bedtime.     busPIRone  (BUSPAR ) 15 MG tablet Take 15 mg by mouth 3 (three) times daily.     Cranberry 450 MG TABS Take 450 mg by mouth in the morning.     Cyanocobalamin  5000 MCG TBDP Place 5,000 mcg under the tongue every morning.     cyclobenzaprine   (FLEXERIL ) 10 MG tablet Take 10 mg by mouth 3 (three) times daily as needed for muscle spasms.     Docusate Sodium  (DSS) 100 MG CAPS Take 100 mg by mouth in the morning and at bedtime.     DULoxetine  (CYMBALTA ) 60 MG capsule Take 60 mg by mouth daily.     ferrous sulfate  325 (65 FE) MG tablet Take 325 mg by mouth daily.      fluticasone  (FLONASE ) 50 MCG/ACT nasal spray Place 1 spray into the nose daily.     folic acid  (FOLVITE ) 400 MCG tablet Take 400 mcg by mouth every morning.     guaiFENesin  (ROBITUSSIN) 100 MG/5ML liquid Take 10 mLs by mouth every 4 (four) hours as needed for cough.     [Paused] hydroxychloroquine  (PLAQUENIL ) 200 MG tablet Take 200 mg by mouth 2 (two) times daily.     Hypromellose (NATURAL BALANCE TEARS OP) Place 2 drops into both eyes in the morning and at bedtime.     LINZESS  145 MCG CAPS capsule Take 145 mcg by mouth daily.     loratadine  (CLARITIN ) 10 MG tablet Take 10 mg by mouth in the morning.     LORazepam  (ATIVAN ) 0.5 MG tablet Take 0.5 mg by mouth 2 (two) times daily.     morphine  (MSIR) 15 MG tablet Take 15 mg by mouth every 8 (eight) hours as needed for severe pain (pain score 7-10).     Multiple Vitamin (MULTIVITAMIN WITH MINERALS) TABS tablet Take 1 tablet by mouth daily.     ondansetron  (ZOFRAN ) 4 MG tablet Take 1 tablet (4 mg total) by mouth every 6 (six) hours as needed for nausea. 20 tablet 0   pantoprazole  (PROTONIX ) 40 MG tablet Take 1 tablet (40 mg total) by mouth daily.     polyethylene glycol (MIRALAX  / GLYCOLAX ) 17 g packet Take 17 g by mouth daily as needed for mild constipation. 14 each 0   pregabalin  (LYRICA ) 75 MG capsule Take 75 mg by mouth 2 (two) times daily.     sennosides-docusate sodium  (SENOKOT-S) 8.6-50 MG tablet Take 2 tablets by mouth in the morning and at bedtime.     SUMAtriptan  (IMITREX ) 50 MG tablet Take 50 mg by mouth every 2 (two) hours as needed for migraine or headache.     tamsulosin  (FLOMAX ) 0.4 MG CAPS capsule Take 0.4 mg by  mouth every morning.     thiamine  100 MG tablet Take 1 tablet (100 mg total) by mouth daily. 30 tablet 0   No current facility-administered medications for this visit.    SURGICAL HISTORY:  Past Surgical History:  Procedure Laterality Date   BACK SURGERY     back surgey     times 3   GIVENS CAPSULE STUDY N/A 08/09/2024   Procedure: IMAGING PROCEDURE, GI TRACT, INTRALUMINAL, VIA CAPSULE;  Surgeon: Rosalie Kitchens, MD;  Location: [facility] ENDOSCOPY;  Service: Gastroenterology;  Laterality: N/A;   IR BONE MARROW BIOPSY & ASPIRATION  08/14/2024   IR INJECT/THERA/INC NEEDLE/CATH/PLC EPI/LUMB/SAC W/IMG  11/26/2020  REVIEW OF SYSTEMS:  Constitutional: positive for fatigue Eyes: negative Ears, nose, mouth, throat, and face: negative Respiratory: negative Cardiovascular: negative Gastrointestinal: negative Genitourinary:negative Integument/breast: negative Hematologic/lymphatic: positive for easy bruising Musculoskeletal:negative Neurological: negative Behavioral/Psych: negative Endocrine: negative Allergic/Immunologic: negative   PHYSICAL EXAMINATION: General appearance: alert, cooperative, fatigued, and no distress Head: Normocephalic, without obvious abnormality, atraumatic Neck: no adenopathy, no JVD, supple, symmetrical, trachea midline, and thyroid  not enlarged, symmetric, no tenderness/mass/nodules Lymph nodes: Cervical, supraclavicular, and axillary nodes normal. Resp: clear to auscultation bilaterally Back: symmetric, no curvature. ROM normal. No CVA tenderness. Cardio: regular rate and rhythm, S1, S2 normal, no murmur, click, rub or gallop GI: soft, non-tender; bowel sounds normal; no masses,  no organomegaly Extremities: extremities normal, atraumatic, no cyanosis or edema and ecchymosis in the upper extremities Neurologic: Alert and oriented X 3, normal strength and tone. Normal symmetric reflexes. Normal coordination and gait  ECOG PERFORMANCE STATUS: 1 - Symptomatic but  completely ambulatory  Blood pressure 111/80, pulse 89, temperature 97.6 F (36.4 C), temperature source Temporal, resp. rate 17, height 5' 10 (1.778 m), weight 201 lb (91.2 kg), SpO2 96%.  LABORATORY DATA: Lab Results  Component Value Date   WBC 2.5 (L) 10/28/2024   HGB 9.1 (L) 10/28/2024   HCT 28.1 (L) 10/28/2024   MCV 88.4 10/28/2024   PLT 105 (L) 10/28/2024      Chemistry      Component Value Date/Time   NA 137 10/08/2024 1102   K 3.9 10/08/2024 1102   CL 100 10/08/2024 1102   CO2 29 10/08/2024 1102   BUN 14 10/08/2024 1102   CREATININE 0.85 10/08/2024 1102   CREATININE 1.08 09/30/2024 1107      Component Value Date/Time   CALCIUM  9.6 10/08/2024 1102   ALKPHOS 62 10/07/2024 0057   AST 16 10/07/2024 0057   AST 13 (L) 08/22/2024 1101   ALT 6 10/07/2024 0057   ALT 9 08/22/2024 1101   BILITOT 0.5 10/07/2024 0057   BILITOT 0.5 08/22/2024 1101       RADIOGRAPHIC STUDIES: ECHOCARDIOGRAM COMPLETE Result Date: 10/08/2024    ECHOCARDIOGRAM REPORT   Patient Name:   DONIELLE RADZIEWICZ Date of Exam: 10/08/2024 Medical Rec #:  [id number]       Height:       70.0 in Accession #:    [id number]      Weight:       202.0 lb Date of Birth:  [birth year]       BSA:          2.096 m Patient Age:    83 years        BP:           136/77 mmHg Patient Gender: M               HR:           83 bpm. Exam Location:  Inpatient Procedure: 2D Echo and Intracardiac Opacification Agent (Both Spectral and Color            Flow Doppler were utilized during procedure). Indications:    Murmur  History:        Patient has no prior history of Echocardiogram examinations.  Sonographer:    Charmaine Gaskins Referring Phys: [id number] MIGNON DASEN GONFA  Sonographer Comments: No subcostal window and Technically difficult study due to poor echo windows. Supine position and Image acquisition challenging due to respiratory motion. IMPRESSIONS  1. Left ventricular ejection fraction, by estimation, is 65 to 70%. The  left ventricle has  normal function. Indeterminate diastolic filling due to E-A fusion.  2. Right ventricular systolic function is normal. The right ventricular size is normal.  3. Left atrial size was mildly dilated.  4. Trivial mitral valve regurgitation.  5. AV is thickened, calcified with restricted motion. Peak and mean gradients through the valve are 76 and 52 mm HG respectively AVA (VTI) is 0.81 cm2. Dimensionless valve indext is 0.23 OVerall consistent with severe AS.SABRA The aortic valve has an indeterminant number of cusps. Aortic valve regurgitation is not visualized. FINDINGS  Left Ventricle: Left ventricular ejection fraction, by estimation, is 65 to 70%. The left ventricle has normal function. Definity  contrast agent was given IV to delineate the left ventricular endocardial borders. The left ventricular internal cavity size was normal in size. There is no left ventricular hypertrophy. Indeterminate diastolic filling due to E-A fusion. Right Ventricle: The right ventricular size is normal. Right vetricular wall thickness was not assessed. Right ventricular systolic function is normal. Left Atrium: Left atrial size was mildly dilated. Right Atrium: Right atrial size was normal in size. Pericardium: There is no evidence of pericardial effusion. Mitral Valve: There is mild thickening of the mitral valve leaflet(s). Mild mitral annular calcification. Trivial mitral valve regurgitation. Tricuspid Valve: The tricuspid valve is normal in structure. Tricuspid valve regurgitation is trivial. Aortic Valve: AV is thickened, calcified with restricted motion. Peak and mean gradients through the valve are 76 and 52 mm HG respectively AVA (VTI) is 0.81 cm2. Dimensionless valve indext is 0.23 OVerall consistent with severe AS. The aortic valve has an indeterminant number of cusps. Aortic valve regurgitation is not visualized. Aortic valve mean gradient measures 52.0 mmHg. Aortic valve peak gradient measures 76.4 mmHg. Aortic valve area, by  VTI measures 0.87 cm. Pulmonic Valve: The pulmonic valve was normal in structure. Pulmonic valve regurgitation is not visualized. Aorta: The aortic root and ascending aorta are structurally normal, with no evidence of dilitation. IAS/Shunts: No atrial level shunt detected by color flow Doppler.  LEFT VENTRICLE PLAX 2D LVIDd:         4.60 cm LVIDs:         2.70 cm LV PW:         0.90 cm LV IVS:        0.90 cm LVOT diam:     2.20 cm LV SV:         84 LV SV Index:   40 LVOT Area:     3.80 cm  RIGHT VENTRICLE RV Basal diam:  3.30 cm RV Mid diam:    3.10 cm RV S prime:     14.00 cm/s LEFT ATRIUM           Index        RIGHT ATRIUM           Index LA diam:      4.00 cm 1.91 cm/m   RA Area:     20.10 cm LA Vol (A2C): 61.5 ml 29.34 ml/m  RA Volume:   58.90 ml  28.10 ml/m LA Vol (A4C): 83.8 ml 39.98 ml/m  AORTIC VALVE AV Area (Vmax):    0.89 cm AV Area (Vmean):   0.82 cm AV Area (VTI):     0.87 cm AV Vmax:           437.00 cm/s AV Vmean:          354.000 cm/s AV VTI:            0.967  m AV Peak Grad:      76.4 mmHg AV Mean Grad:      52.0 mmHg LVOT Vmax:         102.25 cm/s LVOT Vmean:        76.800 cm/s LVOT VTI:          0.221 m LVOT/AV VTI ratio: 0.23  AORTA Ao Root diam: 3.40 cm Ao Asc diam:  3.00 cm  SHUNTS Systemic VTI:  0.22 m Systemic Diam: 2.20 cm Vina Gull MD Electronically signed by Vina Gull MD Signature Date/Time: 10/08/2024/2:39:34 PM    Final    DG Chest Portable 1 View Result Date: 10/06/2024 EXAM: 1 VIEW(S) XRAY OF THE CHEST 10/06/2024 08:13:00 PM COMPARISON: None available. CLINICAL HISTORY: ?pna pna FINDINGS: LUNGS AND PLEURA: Lungs are extremely small and full insufflation has diminished since prior examination. No focal pulmonary opacity. No pleural effusion. No pneumothorax. HEART AND MEDIASTINUM: No acute abnormality of the cardiac and mediastinal silhouettes. BONES AND SOFT TISSUES: Osseous structures are age appropriate with advanced degenerative changes noted within the shoulders  bilaterally. IMPRESSION: 1. No acute process. 2. Pulmonary hypoinflation Electronically signed by: Dorethia Molt MD 10/06/2024 08:45 PM EST RP Workstation: [id number]    ASSESSMENT AND PLAN: This is a very pleasant 83 years old white male diagnosed with myelodysplastic syndrome with excess blasts and high risk IPSS-M in September 2025. I had a lengthy discussion with the patient and his daughter about his current condition and treatment options.  Assessment and Plan Assessment & Plan Myelodysplastic syndrome with excess blasts, high-risk IPSS-M with associated anemia Diagnosed in September 2025, classified as high-risk IPSS-M with a leukemia-free survival of approximately 1.5 years and an average survival of 1.7 years. There is a 14.3% chance of transformation to acute myeloid leukemia within one year. Bone marrow transplant is not an option due to age. - Initiate azacitidine  treatment starting September 02, 2024, with subcutaneous injections for 7 consecutive days every 4 weeks. - Arrange for insurance approval and schedule a chemo class for education on azacitidine  treatment. - Monitor blood counts regularly and provide transfusions if hemoglobin drops significantly. - Provide access to MyChart for monitoring lab results and treatment information.   Myelodysplastic syndrome with excess blasts, high risk Diagnosed in September 2025. Currently on treatment with Vidaza  (azacitidine ) 75 mg/m2 on days 1-5 and 8-9 every four weeks. Status post two cycles. White blood count improved to 2.5 from 1.6 two weeks ago. Hemoglobin improved to 9.1 from 7.4. Platelets at 105. Treatment response is variable and may take several months to see significant improvement. Average survival is approximately one year, with some patients living longer. Risk of progression to leukemia exists. Bone marrow transplant is not feasible due to age. - Adjusted Vidaza  treatment to 75 mg/m2 on days 1-5 every four weeks, discontinued  days 8-9. - Continue weekly lab work to monitor condition.  Urinary tract infection Recurrent urinary tract infections with recent hospitalization. Currently on a weekly antibiotic regimen prescribed by urology, effectively managing symptoms. Risk of severe infection leading to delirium, especially in elderly patients. - Continue current antibiotic regimen as prescribed by urology. For the anemia, we will continue with supportive care and transfusion on as-needed basis. He was advised to call immediately if he has any concerning symptoms in the interval. The patient voices understanding of current disease status and treatment options and is in agreement with the current care plan.  All questions were answered. The patient knows to call the clinic with any problems,  questions or concerns. We can certainly see the patient much sooner if necessary. The total time spent in the appointment was 30 minutes including review of chart and various tests results, discussions about plan of care and coordination of care plan .   Disclaimer: This note was dictated with voice recognition software. Similar sounding words can inadvertently be transcribed and may not be corrected upon review.

## 2024-10-29 ENCOUNTER — Inpatient Hospital Stay

## 2024-10-29 ENCOUNTER — Encounter: Payer: Self-pay | Admitting: Internal Medicine

## 2024-10-29 ENCOUNTER — Other Ambulatory Visit: Payer: Self-pay | Admitting: Physician Assistant

## 2024-10-29 VITALS — BP 106/58 | HR 97 | Temp 98.2°F | Resp 18

## 2024-10-29 DIAGNOSIS — Z5111 Encounter for antineoplastic chemotherapy: Secondary | ICD-10-CM | POA: Diagnosis not present

## 2024-10-29 DIAGNOSIS — I959 Hypotension, unspecified: Secondary | ICD-10-CM

## 2024-10-29 DIAGNOSIS — D469 Myelodysplastic syndrome, unspecified: Secondary | ICD-10-CM

## 2024-10-29 MED ORDER — ONDANSETRON HCL 8 MG PO TABS
8.0000 mg | ORAL_TABLET | Freq: Once | ORAL | Status: AC
  Administered 2024-10-29: 8 mg via ORAL
  Filled 2024-10-29: qty 1

## 2024-10-29 MED ORDER — AZACITIDINE CHEMO SQ INJECTION
75.0000 mg/m2 | Freq: Once | INTRAMUSCULAR | Status: AC
  Administered 2024-10-29: 162.5 mg via SUBCUTANEOUS
  Filled 2024-10-29: qty 6.5

## 2024-10-29 MED ORDER — SODIUM CHLORIDE 0.9 % IV SOLN: Freq: Once | INTRAVENOUS | Status: AC

## 2024-10-29 NOTE — Progress Notes (Signed)
 Patient presents to infusion, BP low (see flowsheets), pt complains of increased fatigued starting yesterday, generally not feeling well, and was unable to stay awake while talking to RN. This RN reached out to [place], [place] for advice on how to proceed prior to Vidaza  treatment.  Per Cassie, administer 1 L IVF and recheck BP following completion, if no improvements, pt will need to be evaluated at the ED. This RN informed patient of plan, and pt was agreeable.   Patient received > 500 ml IVF and informed RN that his ride was waiting outside for him. This RN reached out to [place], [place] again, confirmed that it was okay for pt to receive only half liter along with treatment as planned today. Patient states he is feeling better, BP had improved, see flowsheet. Patient had also consumed multiple Ensures while receiving IVF and had started to become more alert and talkative.  Patient received Vidaza  subcutaneous as planned with no complaints. VSS at discharge, pt discharged to lobby via wheelchair.

## 2024-10-29 NOTE — Patient Instructions (Signed)
 [facility] - A DEPT OF [facility]  Discharge Instructions: Thank you for choosing [facility] to provide your oncology and hematology care.   If you have a lab appointment with the Cancer Center, please go directly to the Cancer Center and check in at the registration area.   Wear comfortable clothing and clothing appropriate for easy access to any Portacath or PICC line.   We strive to give you quality time with your provider. You may need to reschedule your appointment if you arrive late (15 or more minutes).  Arriving late affects you and other patients whose appointments are after yours.  Also, if you miss three or more appointments without notifying the office, you may be dismissed from the clinic at the provider's discretion.      For prescription refill requests, have your pharmacy contact our office and allow 72 hours for refills to be completed.    Today you received the following chemotherapy and/or immunotherapy agents: Vidaza      To help prevent nausea and vomiting after your treatment, we encourage you to take your nausea medication as directed.  BELOW ARE SYMPTOMS THAT SHOULD BE REPORTED IMMEDIATELY: *FEVER GREATER THAN 100.4 F (38 C) OR HIGHER *CHILLS OR SWEATING *NAUSEA AND VOMITING THAT IS NOT CONTROLLED WITH YOUR NAUSEA MEDICATION *UNUSUAL SHORTNESS OF BREATH *UNUSUAL BRUISING OR BLEEDING *URINARY PROBLEMS (pain or burning when urinating, or frequent urination) *BOWEL PROBLEMS (unusual diarrhea, constipation, pain near the anus) TENDERNESS IN MOUTH AND THROAT WITH OR WITHOUT PRESENCE OF ULCERS (sore throat, sores in mouth, or a toothache) UNUSUAL RASH, SWELLING OR PAIN  UNUSUAL VAGINAL DISCHARGE OR ITCHING   Items with * indicate a potential emergency and should be followed up as soon as possible or go to the Emergency Department if any problems should occur.  Please show the CHEMOTHERAPY ALERT CARD or IMMUNOTHERAPY  ALERT CARD at check-in to the Emergency Department and triage nurse.  Should you have questions after your visit or need to cancel or reschedule your appointment, please contact [facility] - A DEPT OF [facility]  Dept: [phone]  and follow the prompts.  Office hours are 8:00 a.m. to 4:30 p.m. Monday - Friday. Please note that voicemails left after 4:00 p.m. may not be returned until the following business day.  We are closed weekends and major holidays. You have access to a nurse at all times for urgent questions. Please call the main number to the clinic Dept: [phone] and follow the prompts.   For any non-urgent questions, you may also contact your provider using MyChart. We now offer e-Visits for anyone 5 and older to request care online for non-urgent symptoms. For details visit [email or web address].   Also download the MyChart app! Go to the app store, search "MyChart", open the app, select [facility], and log in with your MyChart username and password.

## 2024-10-30 ENCOUNTER — Other Ambulatory Visit: Payer: Self-pay | Admitting: Physician Assistant

## 2024-10-30 ENCOUNTER — Inpatient Hospital Stay

## 2024-10-30 VITALS — BP 93/51 | HR 96 | Temp 98.5°F | Resp 18

## 2024-10-30 DIAGNOSIS — I959 Hypotension, unspecified: Secondary | ICD-10-CM

## 2024-10-30 DIAGNOSIS — M25559 Pain in unspecified hip: Secondary | ICD-10-CM

## 2024-10-30 DIAGNOSIS — Z5111 Encounter for antineoplastic chemotherapy: Secondary | ICD-10-CM | POA: Diagnosis not present

## 2024-10-30 DIAGNOSIS — D469 Myelodysplastic syndrome, unspecified: Secondary | ICD-10-CM

## 2024-10-30 MED ORDER — ACETAMINOPHEN 325 MG PO TABS
325.0000 mg | ORAL_TABLET | Freq: Once | ORAL | Status: AC
  Administered 2024-10-30: 325 mg via ORAL
  Filled 2024-10-30: qty 1

## 2024-10-30 MED ORDER — ONDANSETRON HCL 8 MG PO TABS
8.0000 mg | ORAL_TABLET | Freq: Once | ORAL | Status: AC
  Administered 2024-10-30: 8 mg via ORAL
  Filled 2024-10-30: qty 1

## 2024-10-30 MED ORDER — SODIUM CHLORIDE 0.9 % IV SOLN: Freq: Once | INTRAVENOUS | Status: AC

## 2024-10-30 MED ORDER — AZACITIDINE CHEMO SQ INJECTION
75.0000 mg/m2 | Freq: Once | INTRAMUSCULAR | Status: AC
  Administered 2024-10-30: 162.5 mg via SUBCUTANEOUS
  Filled 2024-10-30: qty 6.5

## 2024-10-30 MED ORDER — OXYCODONE HCL 5 MG PO TABS
5.0000 mg | ORAL_TABLET | Freq: Once | ORAL | Status: AC
  Administered 2024-10-30: 5 mg via ORAL
  Filled 2024-10-30: qty 1

## 2024-10-30 NOTE — Patient Instructions (Addendum)
 [facility] - A DEPT OF [facility]   Discharge Instructions: Thank you for choosing [facility] to provide your oncology and hematology care.   If you have a lab appointment with the Cancer Center, please go directly to the Cancer Center and check in at the registration area.   Wear comfortable clothing and clothing appropriate for easy access to any Portacath or PICC line.   We strive to give you quality time with your provider. You may need to reschedule your appointment if you arrive late (15 or more minutes).  Arriving late affects you and other patients whose appointments are after yours.  Also, if you miss three or more appointments without notifying the office, you may be dismissed from the clinic at the provider's discretion.      For prescription refill requests, have your pharmacy contact our office and allow 72 hours for refills to be completed.    Today you received the following chemotherapy and/or immunotherapy agents: Azacitidine (Vidaza)      To help prevent nausea and vomiting after your treatment, we encourage you to take your nausea medication as directed.  BELOW ARE SYMPTOMS THAT SHOULD BE REPORTED IMMEDIATELY: *FEVER GREATER THAN 100.4 F (38 C) OR HIGHER *CHILLS OR SWEATING *NAUSEA AND VOMITING THAT IS NOT CONTROLLED WITH YOUR NAUSEA MEDICATION *UNUSUAL SHORTNESS OF BREATH *UNUSUAL BRUISING OR BLEEDING *URINARY PROBLEMS (pain or burning when urinating, or frequent urination) *BOWEL PROBLEMS (unusual diarrhea, constipation, pain near the anus) TENDERNESS IN MOUTH AND THROAT WITH OR WITHOUT PRESENCE OF ULCERS (sore throat, sores in mouth, or a toothache) UNUSUAL RASH, SWELLING OR PAIN  UNUSUAL VAGINAL DISCHARGE OR ITCHING   Items with * indicate a potential emergency and should be followed up as soon as possible or go to the Emergency Department if any problems should occur.  Please show the CHEMOTHERAPY ALERT CARD or  IMMUNOTHERAPY ALERT CARD at check-in to the Emergency Department and triage nurse.  Should you have questions after your visit or need to cancel or reschedule your appointment, please contact [facility] - A DEPT OF [facility]  Dept: [phone]  and follow the prompts.  Office hours are 8:00 a.m. to 4:30 p.m. Monday - Friday. Please note that voicemails left after 4:00 p.m. may not be returned until the following business day.  We are closed weekends and major holidays. You have access to a nurse at all times for urgent questions. Please call the main number to the clinic Dept: [phone] and follow the prompts.   For any non-urgent questions, you may also contact your provider using MyChart. We now offer e-Visits for anyone 1 and older to request care online for non-urgent symptoms. For details visit [email or web address].   Also download the MyChart app! Go to the app store, search "MyChart", open the app, select [place], and log in with your MyChart username and password.

## 2024-10-30 NOTE — Progress Notes (Signed)
 Ok to proceed with tx today per Dr. [name] and Charlott, PA-C, as pt will receive Percocet and IVF for pain and hypotension.  Zaim Nitta, PharmD, MBA

## 2024-10-31 ENCOUNTER — Inpatient Hospital Stay

## 2024-10-31 VITALS — BP 120/73 | HR 99 | Temp 98.2°F | Resp 18

## 2024-10-31 DIAGNOSIS — Z5111 Encounter for antineoplastic chemotherapy: Secondary | ICD-10-CM | POA: Diagnosis not present

## 2024-10-31 DIAGNOSIS — D469 Myelodysplastic syndrome, unspecified: Secondary | ICD-10-CM

## 2024-10-31 MED ORDER — AZACITIDINE CHEMO SQ INJECTION
75.0000 mg/m2 | Freq: Once | INTRAMUSCULAR | Status: AC
  Administered 2024-10-31: 162.5 mg via SUBCUTANEOUS
  Filled 2024-10-31: qty 6.5

## 2024-10-31 MED ORDER — ONDANSETRON HCL 8 MG PO TABS
8.0000 mg | ORAL_TABLET | Freq: Once | ORAL | Status: AC
  Administered 2024-10-31: 8 mg via ORAL
  Filled 2024-10-31: qty 1

## 2024-10-31 NOTE — Patient Instructions (Signed)
 [facility] - A DEPT OF [facility]   Discharge Instructions: Thank you for choosing [facility] to provide your oncology and hematology care.   If you have a lab appointment with the Cancer Center, please go directly to the Cancer Center and check in at the registration area.   Wear comfortable clothing and clothing appropriate for easy access to any Portacath or PICC line.   We strive to give you quality time with your provider. You may need to reschedule your appointment if you arrive late (15 or more minutes).  Arriving late affects you and other patients whose appointments are after yours.  Also, if you miss three or more appointments without notifying the office, you may be dismissed from the clinic at the provider's discretion.      For prescription refill requests, have your pharmacy contact our office and allow 72 hours for refills to be completed.    Today you received the following chemotherapy and/or immunotherapy agents: Azacitidine (Vidaza)      To help prevent nausea and vomiting after your treatment, we encourage you to take your nausea medication as directed.  BELOW ARE SYMPTOMS THAT SHOULD BE REPORTED IMMEDIATELY: *FEVER GREATER THAN 100.4 F (38 C) OR HIGHER *CHILLS OR SWEATING *NAUSEA AND VOMITING THAT IS NOT CONTROLLED WITH YOUR NAUSEA MEDICATION *UNUSUAL SHORTNESS OF BREATH *UNUSUAL BRUISING OR BLEEDING *URINARY PROBLEMS (pain or burning when urinating, or frequent urination) *BOWEL PROBLEMS (unusual diarrhea, constipation, pain near the anus) TENDERNESS IN MOUTH AND THROAT WITH OR WITHOUT PRESENCE OF ULCERS (sore throat, sores in mouth, or a toothache) UNUSUAL RASH, SWELLING OR PAIN  UNUSUAL VAGINAL DISCHARGE OR ITCHING   Items with * indicate a potential emergency and should be followed up as soon as possible or go to the Emergency Department if any problems should occur.  Please show the CHEMOTHERAPY ALERT CARD or  IMMUNOTHERAPY ALERT CARD at check-in to the Emergency Department and triage nurse.  Should you have questions after your visit or need to cancel or reschedule your appointment, please contact [facility] - A DEPT OF [facility]  Dept: [phone]  and follow the prompts.  Office hours are 8:00 a.m. to 4:30 p.m. Monday - Friday. Please note that voicemails left after 4:00 p.m. may not be returned until the following business day.  We are closed weekends and major holidays. You have access to a nurse at all times for urgent questions. Please call the main number to the clinic Dept: [phone] and follow the prompts.   For any non-urgent questions, you may also contact your provider using MyChart. We now offer e-Visits for anyone 1 and older to request care online for non-urgent symptoms. For details visit [email or web address].   Also download the MyChart app! Go to the app store, search "MyChart", open the app, select [place], and log in with your MyChart username and password.

## 2024-11-01 ENCOUNTER — Inpatient Hospital Stay

## 2024-11-01 ENCOUNTER — Other Ambulatory Visit: Payer: Self-pay | Admitting: Physician Assistant

## 2024-11-01 VITALS — BP 137/56 | HR 99 | Temp 99.2°F | Resp 18

## 2024-11-01 DIAGNOSIS — D469 Myelodysplastic syndrome, unspecified: Secondary | ICD-10-CM

## 2024-11-01 DIAGNOSIS — Z5111 Encounter for antineoplastic chemotherapy: Secondary | ICD-10-CM | POA: Diagnosis not present

## 2024-11-01 DIAGNOSIS — G8929 Other chronic pain: Secondary | ICD-10-CM

## 2024-11-01 MED ORDER — ACETAMINOPHEN 325 MG PO TABS
325.0000 mg | ORAL_TABLET | Freq: Once | ORAL | Status: AC
  Administered 2024-11-01: 325 mg via ORAL
  Filled 2024-11-01: qty 1

## 2024-11-01 MED ORDER — ONDANSETRON HCL 8 MG PO TABS
8.0000 mg | ORAL_TABLET | Freq: Once | ORAL | Status: AC
  Administered 2024-11-01: 8 mg via ORAL
  Filled 2024-11-01: qty 1

## 2024-11-01 MED ORDER — AZACITIDINE CHEMO SQ INJECTION
75.0000 mg/m2 | Freq: Once | INTRAMUSCULAR | Status: AC
  Administered 2024-11-01: 162.5 mg via SUBCUTANEOUS
  Filled 2024-11-01: qty 6.5

## 2024-11-01 NOTE — Patient Instructions (Signed)
 [facility] - A DEPT OF [place]  Discharge Instructions: Thank you for choosing [facility] to provide your oncology and hematology care.   If you have a lab appointment with the Cancer Center, please go directly to the Cancer Center and check in at the registration area.   Wear comfortable clothing and clothing appropriate for easy access to any Portacath or PICC line.   We strive to give you quality time with your provider. You may need to reschedule your appointment if you arrive late (15 or more minutes).  Arriving late affects you and other patients whose appointments are after yours.  Also, if you miss three or more appointments without notifying the office, you may be dismissed from the clinic at the provider's discretion.      For prescription refill requests, have your pharmacy contact our office and allow 72 hours for refills to be completed.    Today you received the following chemotherapy and/or immunotherapy agents vidaza       To help prevent nausea and vomiting after your treatment, we encourage you to take your nausea medication as directed.  BELOW ARE SYMPTOMS THAT SHOULD BE REPORTED IMMEDIATELY: *FEVER GREATER THAN 100.4 F (38 C) OR HIGHER *CHILLS OR SWEATING *NAUSEA AND VOMITING THAT IS NOT CONTROLLED WITH YOUR NAUSEA MEDICATION *UNUSUAL SHORTNESS OF BREATH *UNUSUAL BRUISING OR BLEEDING *URINARY PROBLEMS (pain or burning when urinating, or frequent urination) *BOWEL PROBLEMS (unusual diarrhea, constipation, pain near the anus) TENDERNESS IN MOUTH AND THROAT WITH OR WITHOUT PRESENCE OF ULCERS (sore throat, sores in mouth, or a toothache) UNUSUAL RASH, SWELLING OR PAIN  UNUSUAL VAGINAL DISCHARGE OR ITCHING   Items with * indicate a potential emergency and should be followed up as soon as possible or go to the Emergency Department if any problems should occur.  Please show the CHEMOTHERAPY ALERT CARD or IMMUNOTHERAPY  ALERT CARD at check-in to the Emergency Department and triage nurse.  Should you have questions after your visit or need to cancel or reschedule your appointment, please contact [name]  Dept: [phone]  and follow the prompts.  Office hours are 8:00 a.m. to 4:30 p.m. Monday - Friday. Please note that voicemails left after 4:00 p.m. may not be returned until the following business day.  We are closed weekends and major holidays. You have access to a nurse at all times for urgent questions. Please call the main number to the clinic Dept: [phone] and follow the prompts.   For any non-urgent questions, you may also contact your provider using MyChart. We now offer e-Visits for anyone 14 and older to request care online for non-urgent symptoms. For details visit [email or web address].   Also download the MyChart app! Go to the app store, search MyChart, open the app, select [place], and log in with your MyChart username and password.

## 2024-11-04 ENCOUNTER — Other Ambulatory Visit: Payer: Self-pay | Admitting: Medical Oncology

## 2024-11-04 ENCOUNTER — Inpatient Hospital Stay

## 2024-11-04 ENCOUNTER — Telehealth: Payer: Self-pay | Admitting: Medical Oncology

## 2024-11-04 DIAGNOSIS — D469 Myelodysplastic syndrome, unspecified: Secondary | ICD-10-CM

## 2024-11-04 DIAGNOSIS — Z5111 Encounter for antineoplastic chemotherapy: Secondary | ICD-10-CM | POA: Diagnosis not present

## 2024-11-04 LAB — CBC WITH DIFFERENTIAL (CANCER CENTER ONLY)
Abs Immature Granulocytes: 0.03 K/uL (ref 0.00–0.07)
Basophils Absolute: 0 K/uL (ref 0.0–0.1)
Basophils Relative: 0 %
Eosinophils Absolute: 0 K/uL (ref 0.0–0.5)
Eosinophils Relative: 0 %
HCT: 20.2 % — ABNORMAL LOW (ref 39.0–52.0)
Hemoglobin: 6.6 g/dL — CL (ref 13.0–17.0)
Immature Granulocytes: 1 %
Lymphocytes Relative: 45 %
Lymphs Abs: 1 K/uL (ref 0.7–4.0)
MCH: 28.6 pg (ref 26.0–34.0)
MCHC: 32.7 g/dL (ref 30.0–36.0)
MCV: 87.4 fL (ref 80.0–100.0)
Monocytes Absolute: 0.1 K/uL (ref 0.1–1.0)
Monocytes Relative: 3 %
Neutro Abs: 1.2 K/uL — ABNORMAL LOW (ref 1.7–7.7)
Neutrophils Relative %: 51 %
Platelet Count: 149 K/uL — ABNORMAL LOW (ref 150–400)
RBC: 2.31 MIL/uL — ABNORMAL LOW (ref 4.22–5.81)
RDW: 14.8 % (ref 11.5–15.5)
WBC Count: 2.3 K/uL — ABNORMAL LOW (ref 4.0–10.5)
nRBC: 0 % (ref 0.0–0.2)

## 2024-11-04 LAB — PREPARE RBC (CROSSMATCH)

## 2024-11-04 LAB — SAMPLE TO BLOOD BANK

## 2024-11-04 NOTE — Progress Notes (Signed)
 Blood transfusion for 2 units of RBC ordered and released for tomorrow.

## 2024-11-04 NOTE — Progress Notes (Signed)
 Labs ordered -pt notified. Blood transfusion scheduled.for today

## 2024-11-04 NOTE — Progress Notes (Signed)
 Prepare ordered and released/.

## 2024-11-04 NOTE — Progress Notes (Signed)
 Jorge Sherman

## 2024-11-04 NOTE — Progress Notes (Signed)
 SABRA

## 2024-11-04 NOTE — Telephone Encounter (Signed)
 Pt presented to [facility] in a wheelchair without an appt. He requested to get his labs checked because he feels extremely tired. 'I think I need blood. Appt made  labs ordered.

## 2024-11-04 NOTE — Progress Notes (Signed)
 Orders in

## 2024-11-04 NOTE — Progress Notes (Signed)
 Premeds ordered. Pt aware of appt today.

## 2024-11-05 ENCOUNTER — Inpatient Hospital Stay

## 2024-11-05 ENCOUNTER — Telehealth: Payer: Self-pay

## 2024-11-05 DIAGNOSIS — D469 Myelodysplastic syndrome, unspecified: Secondary | ICD-10-CM

## 2024-11-05 DIAGNOSIS — Z5111 Encounter for antineoplastic chemotherapy: Secondary | ICD-10-CM | POA: Diagnosis not present

## 2024-11-05 DIAGNOSIS — Z9289 Personal history of other medical treatment: Secondary | ICD-10-CM

## 2024-11-05 HISTORY — DX: Personal history of other medical treatment: Z92.89

## 2024-11-05 MED ORDER — CETIRIZINE HCL 10 MG PO TABS
10.0000 mg | ORAL_TABLET | Freq: Once | ORAL | Status: AC
  Administered 2024-11-05: 10:00:00 10 mg via ORAL
  Filled 2024-11-05: qty 1

## 2024-11-05 MED ORDER — ACETAMINOPHEN 325 MG PO TABS
650.0000 mg | ORAL_TABLET | Freq: Once | ORAL | Status: AC
  Administered 2024-11-05: 10:00:00 650 mg via ORAL
  Filled 2024-11-05: qty 2

## 2024-11-05 MED ORDER — SODIUM CHLORIDE 0.9% IV SOLUTION
250.0000 mL | INTRAVENOUS | Status: DC
  Administered 2024-11-05: 10:00:00 250 mL via INTRAVENOUS

## 2024-11-05 NOTE — Patient Instructions (Signed)
 Blood Transfusion, Adult, Care After After a blood transfusion, it is common to have: Bruising and soreness at the IV site. A headache. Follow these instructions at home: Your doctor may give you more instructions. If you have problems, contact your doctor. Insertion site care     Follow instructions from your doctor about how to take care of your insertion site. This is where an IV tube was put into your vein. Make sure you: Wash your hands with soap and water  for at least 20 seconds before and after you change your bandage. If you cannot use soap and water , use hand sanitizer. Change your bandage as told by your doctor. Check your insertion site every day for signs of infection. Check for: Redness, swelling, or pain. Bleeding from the site. Warmth. Pus or a bad smell. General instructions Take over-the-counter and prescription medicines only as told by your doctor. Rest as told by your doctor. Go back to your normal activities as told by your doctor. Keep all follow-up visits. You may need to have tests at certain times to check your blood. Contact a doctor if: You have itching or red, swollen areas of skin (hives). You have a fever or chills. You have pain in the head, back, or chest. You feel worried or nervous (anxious). You feel weak after doing your normal activities. You have any of these problems at the insertion site: Redness, swelling, warmth, or pain. Bleeding that does not stop with pressure. Pus or a bad smell. If you received your blood transfusion in an outpatient setting, you will be told whom to contact to report any reactions. Get help right away if: You have signs of a serious reaction. This may be coming from an allergy or the body's defense system (immune system). Signs include: Trouble breathing or shortness of breath. Swelling of the face or feeling warm (flushed). A widespread rash. Dark pee (urine) or blood in the pee. Fast heartbeat. These symptoms  may be an emergency. Get help right away. Call 911. Do not wait to see if the symptoms will go away. Do not drive yourself to the hospital. Summary Bruising and soreness at the IV site are common. Check your insertion site every day for signs of infection. Rest as told by your doctor. Go back to your normal activities as told by your doctor. Get help right away if you have signs of a serious reaction. This information is not intended to replace advice given to you by your health care provider. Make sure you discuss any questions you have with your health care provider. Azacitidine  Injection What is this medication? AZACITIDINE  (ay za SITE i deen) treats blood and bone marrow cancers. It works by slowing down the growth of cancer cells. This medicine may be used for other purposes; ask your health care provider or pharmacist if you have questions. COMMON BRAND NAME(S): Vidaza  What should I tell my care team before I take this medication? They need to know if you have any of these conditions: Kidney disease Liver disease Low blood cell levels, such as low white cells, platelets, or red blood cells Low levels of albumin in the blood Low levels of bicarbonate in the blood An unusual or allergic reaction to azacitidine , mannitol, other medications, foods, dyes, or preservatives If you or your partner are pregnant or trying to get pregnant Breast-feeding How should I use this medication? This medication is injected into a vein or under the skin. It is given by your care team in  a hospital or clinic setting. Talk to your care team about the use of this medication in children. While it may be prescribed for children as young as 1 month for selected conditions, precautions do apply. Overdosage: If you think you have taken too much of this medicine contact a poison control center or emergency room at once. NOTE: This medicine is only for you. Do not share this medicine with others. What if I miss  a dose? Keep appointments for follow-up doses. It is important not to miss your dose. Call your care team if you are unable to keep an appointment. What may interact with this medication? Interactions are not expected. This list may not describe all possible interactions. Give your health care provider a list of all the medicines, herbs, non-prescription drugs, or dietary supplements you use. Also tell them if you smoke, drink alcohol , or use illegal drugs. Some items may interact with your medicine. What should I watch for while using this medication? Your condition will be monitored carefully while you are receiving this medication. This medication may make you feel generally unwell. This is not uncommon as chemotherapy can affect healthy cells as well as cancer cells. Report any side effects. Continue your course of treatment even though you feel ill unless your care team tells you to stop. You may need blood work done while you are taking this medication. Other product types may be available that contain the medication azacitidine . The injection and oral products should not be used in place of one another. Talk to your care team if you have questions. This medication can cause serious side effects. To reduce the risk, your care team may give you other medications to take before receiving this one. Be sure to follow the directions from your care team. This medication may increase your risk of getting an infection. Call your care team for advice if you get a fever, chills, sore throat, or other symptoms of a cold or flu. Do not treat yourself. Try to avoid being around people who are sick. Avoid taking medications that contain aspirin , acetaminophen , ibuprofen, naproxen, or ketoprofen unless instructed by your care team. These medications may hide a fever. Be careful brushing or flossing your teeth or using a toothpick because you may get an infection or bleed more easily. If you have any dental work  done, tell your dentist you are receiving this medication. Talk to your care team if you or your partner may be pregnant. Serious birth defects can occur if you take this medication during pregnancy and for 6 months after the last dose. You will need a negative pregnancy test before starting this medication. Contraception is recommended while taking this medication and for 6 months after the last dose. Your care team can help you find the option that works for you. If your partner can get pregnant, use a condom during sex while taking this medication and for 3 months after the last dose. Do not breastfeed while taking this medication and for 1 week after the last dose. This medication may cause infertility. Talk to your care team if you are concerned about your fertility. What side effects may I notice from receiving this medication? Side effects that you should report to your care team as soon as possible: Allergic reactions--skin rash, itching, hives, swelling of the face, lips, tongue, or throat Infection--fever, chills, cough, sore throat, wounds that don't heal, pain or trouble when passing urine, general feeling of discomfort or being unwell Kidney injury--decrease  in the amount of urine, swelling of the ankles, hands, or feet Liver injury--right upper belly pain, loss of appetite, nausea, light-colored stool, dark yellow or brown urine, yellowing skin or eyes, unusual weakness or fatigue Low red blood cell level--unusual weakness or fatigue, dizziness, headache, trouble breathing Tumor lysis syndrome (TLS)--nausea, vomiting, diarrhea, decrease in the amount of urine, dark urine, unusual weakness or fatigue, confusion, muscle pain or cramps, fast or irregular heartbeat, joint pain Unusual bruising or bleeding Side effects that usually do not require medical attention (report to your care team if they continue or are bothersome): Constipation Diarrhea Nausea Pain, redness, or irritation at  injection site Vomiting This list may not describe all possible side effects. Call your doctor for medical advice about side effects. You may report side effects to FDA at 1-800-FDA-1088. Where should I keep my medication? This medication is given in a hospital or clinic. It will not be stored at home. NOTE: This sheet is a summary. It may not cover all possible information. If you have questions about this medicine, talk to your doctor, pharmacist, or health care provider.  2024 Elsevier/Gold Standard (2023-07-10 00:00:00)

## 2024-11-05 NOTE — Telephone Encounter (Signed)
 [name] notified of pts appt today and his next appt in Jan.

## 2024-11-06 LAB — TYPE AND SCREEN
ABO/RH(D): A POS
Antibody Screen: NEGATIVE
Unit division: 0
Unit division: 0

## 2024-11-06 LAB — BPAM RBC
Blood Product Expiration Date: 202601102359
Blood Product Expiration Date: 202601102359
ISSUE DATE / TIME: 202512161036
ISSUE DATE / TIME: 202512161036
Unit Type and Rh: 6200
Unit Type and Rh: 6200

## 2024-11-12 ENCOUNTER — Observation Stay (HOSPITAL_COMMUNITY)
Admission: EM | Admit: 2024-11-12 | Discharge: 2024-11-13 | Disposition: A | Discharge status: Skilled Nursing Facility | Attending: Emergency Medicine | Admitting: Emergency Medicine

## 2024-11-12 ENCOUNTER — Other Ambulatory Visit: Payer: Self-pay

## 2024-11-12 ENCOUNTER — Encounter (HOSPITAL_COMMUNITY): Payer: Self-pay | Admitting: Urology

## 2024-11-12 DIAGNOSIS — Z993 Dependence on wheelchair: Secondary | ICD-10-CM | POA: Insufficient documentation

## 2024-11-12 DIAGNOSIS — I2510 Atherosclerotic heart disease of native coronary artery without angina pectoris: Secondary | ICD-10-CM | POA: Diagnosis not present

## 2024-11-12 DIAGNOSIS — G4733 Obstructive sleep apnea (adult) (pediatric): Secondary | ICD-10-CM | POA: Diagnosis not present

## 2024-11-12 DIAGNOSIS — E1169 Type 2 diabetes mellitus with other specified complication: Secondary | ICD-10-CM

## 2024-11-12 DIAGNOSIS — Z794 Long term (current) use of insulin: Secondary | ICD-10-CM | POA: Diagnosis not present

## 2024-11-12 DIAGNOSIS — I10 Essential (primary) hypertension: Secondary | ICD-10-CM | POA: Diagnosis not present

## 2024-11-12 DIAGNOSIS — D6481 Anemia due to antineoplastic chemotherapy: Secondary | ICD-10-CM | POA: Diagnosis not present

## 2024-11-12 DIAGNOSIS — E782 Mixed hyperlipidemia: Secondary | ICD-10-CM

## 2024-11-12 DIAGNOSIS — E7849 Other hyperlipidemia: Secondary | ICD-10-CM | POA: Diagnosis not present

## 2024-11-12 DIAGNOSIS — Z87891 Personal history of nicotine dependence: Secondary | ICD-10-CM | POA: Insufficient documentation

## 2024-11-12 DIAGNOSIS — T451X5A Adverse effect of antineoplastic and immunosuppressive drugs, initial encounter: Secondary | ICD-10-CM | POA: Diagnosis not present

## 2024-11-12 DIAGNOSIS — N390 Urinary tract infection, site not specified: Secondary | ICD-10-CM | POA: Diagnosis not present

## 2024-11-12 DIAGNOSIS — E119 Type 2 diabetes mellitus without complications: Secondary | ICD-10-CM

## 2024-11-12 DIAGNOSIS — Z79899 Other long term (current) drug therapy: Secondary | ICD-10-CM | POA: Insufficient documentation

## 2024-11-12 DIAGNOSIS — D696 Thrombocytopenia, unspecified: Secondary | ICD-10-CM

## 2024-11-12 DIAGNOSIS — I5030 Unspecified diastolic (congestive) heart failure: Secondary | ICD-10-CM | POA: Diagnosis not present

## 2024-11-12 DIAGNOSIS — R0602 Shortness of breath: Secondary | ICD-10-CM | POA: Diagnosis present

## 2024-11-12 DIAGNOSIS — D649 Anemia, unspecified: Principal | ICD-10-CM | POA: Diagnosis present

## 2024-11-12 DIAGNOSIS — D469 Myelodysplastic syndrome, unspecified: Secondary | ICD-10-CM | POA: Diagnosis not present

## 2024-11-12 DIAGNOSIS — I110 Hypertensive heart disease with heart failure: Secondary | ICD-10-CM | POA: Diagnosis not present

## 2024-11-12 LAB — CBC WITH DIFFERENTIAL/PLATELET
Abs Immature Granulocytes: 0.07 K/uL (ref 0.00–0.07)
Basophils Absolute: 0 K/uL (ref 0.0–0.1)
Basophils Relative: 0 %
Eosinophils Absolute: 0 K/uL (ref 0.0–0.5)
Eosinophils Relative: 0 %
HCT: 19.4 % — ABNORMAL LOW (ref 39.0–52.0)
Hemoglobin: 6.2 g/dL — CL (ref 13.0–17.0)
Immature Granulocytes: 3 %
Lymphocytes Relative: 60 %
Lymphs Abs: 1.3 K/uL (ref 0.7–4.0)
MCH: 29.2 pg (ref 26.0–34.0)
MCHC: 32 g/dL (ref 30.0–36.0)
MCV: 91.5 fL (ref 80.0–100.0)
Monocytes Absolute: 0.1 K/uL (ref 0.1–1.0)
Monocytes Relative: 4 %
Neutro Abs: 0.7 K/uL — ABNORMAL LOW (ref 1.7–7.7)
Neutrophils Relative %: 33 %
Platelets: 96 K/uL — ABNORMAL LOW (ref 150–400)
RBC: 2.12 MIL/uL — ABNORMAL LOW (ref 4.22–5.81)
RDW: 15 % (ref 11.5–15.5)
WBC: 2.2 K/uL — ABNORMAL LOW (ref 4.0–10.5)
nRBC: 0 % (ref 0.0–0.2)

## 2024-11-12 LAB — RETICULOCYTES
Immature Retic Fract: 34 % — ABNORMAL HIGH (ref 2.3–15.9)
RBC.: 2.1 MIL/uL — ABNORMAL LOW (ref 4.22–5.81)
Retic Count, Absolute: 114 K/uL (ref 19.0–186.0)
Retic Ct Pct: 5.4 % — ABNORMAL HIGH (ref 0.4–3.1)

## 2024-11-12 LAB — HEPATIC FUNCTION PANEL
ALT: 9 U/L (ref 0–44)
AST: 17 U/L (ref 15–41)
Albumin: 3.6 g/dL (ref 3.5–5.0)
Alkaline Phosphatase: 55 U/L (ref 38–126)
Bilirubin, Direct: 0.2 mg/dL (ref 0.0–0.2)
Indirect Bilirubin: 0.2 mg/dL — ABNORMAL LOW (ref 0.3–0.9)
Total Bilirubin: 0.3 mg/dL (ref 0.0–1.2)
Total Protein: 6.5 g/dL (ref 6.5–8.1)

## 2024-11-12 LAB — MAGNESIUM: Magnesium: 2 mg/dL (ref 1.7–2.4)

## 2024-11-12 LAB — CK: Total CK: 33 U/L — ABNORMAL LOW (ref 49–397)

## 2024-11-12 LAB — BASIC METABOLIC PANEL WITH GFR
Anion gap: 8 (ref 5–15)
BUN: 30 mg/dL — ABNORMAL HIGH (ref 8–23)
CO2: 24 mmol/L (ref 22–32)
Calcium: 8.7 mg/dL — ABNORMAL LOW (ref 8.9–10.3)
Chloride: 99 mmol/L (ref 98–111)
Creatinine, Ser: 1.07 mg/dL (ref 0.61–1.24)
GFR, Estimated: >60 mL/min
Glucose, Bld: 106 mg/dL — ABNORMAL HIGH (ref 70–99)
Potassium: 4.6 mmol/L (ref 3.5–5.1)
Sodium: 131 mmol/L — ABNORMAL LOW (ref 135–145)

## 2024-11-12 LAB — PREPARE RBC (CROSSMATCH)

## 2024-11-12 LAB — CBG MONITORING, ED: Glucose-Capillary: 90 mg/dL (ref 70–99)

## 2024-11-12 LAB — PHOSPHORUS: Phosphorus: 3.8 mg/dL (ref 2.5–4.6)

## 2024-11-12 MED ORDER — SODIUM CHLORIDE 0.9% IV SOLUTION: Freq: Once | INTRAVENOUS | Status: DC

## 2024-11-12 MED ORDER — INSULIN ASPART 100 UNIT/ML IJ SOLN: 0.0000 [IU] | INTRAMUSCULAR | Status: DC

## 2024-11-12 NOTE — Progress Notes (Signed)
 For Anesthesia: PCP - Devoria DELENA Maffucci, MD  Cardiologist - Rohrbeck, Elspeth Banks, MD office visit note 09/24/24 CE  Pulmonology- Gregory Blaze, MD [facility]  Oncologist- [name] [name], MD   Bowel Prep reminder:  Chest x-ray - 10/06/24 in [facility] EKG - 10/07/24 in [facility] Stress Test -  ECHO - 12/09/23 in [facility] Cardiac Cath -  Pacemaker/ICD device last checked: Pacemaker orders received: Device Rep notified:  Spinal Cord Stimulator:  Sleep Study -  CPAP -   Fasting Blood Sugar -  Checks Blood Sugar _____ times a day Date and result of last Hgb A1c-  Last dose of GLP1 agonist-  GLP1 instructions: Hold 7 days prior to schedule (Hold 24 hours-daily)   Last dose of SGLT-2 inhibitors-  SGLT-2 instructions: Hold 72 hours prior to surgery  Blood Thinner Instructions: Last Dose: Time last taken:  Aspirin  Instructions: Last Dose: Time last taken:  Activity level: Can go up a flight of stairs and activities of daily living without stopping and without chest pain and/or shortness of breath   Able to exercise without chest pain and/or shortness of breath   Unable to go up a flight of stairs without chest pain and/or shortness of breath     Anesthesia review: Prolonged PR intervals noted on EKG 10/07/24, Nonrheumatic aortic valve stenosis moderate to severe aortic stenosis ,  Chronic coronary artery disease   Patient denies shortness of breath, fever, cough and chest pain at PAT appointment   Patient verbalized understanding of instructions that were reviewed over the telephone.

## 2024-11-12 NOTE — Assessment & Plan Note (Signed)
 Will transfuse 1 unit and recheck

## 2024-11-12 NOTE — Subjective & Objective (Signed)
 Patient coming from  [facility]  with fatigue and hg was found to be 6.5 on repeat in er 6.2  Hx of Myelodysplastic syndrome with excess blast  transfusion dependent Followed by Dr.  [name] currently on chemotherapy currently undergoing treatment with Vidaza  (azacitidine )   Has been admitted to [facility] for UTI in End November He gets recurrent UTIs for which she takes prophylactic antibiotics and followed by urology

## 2024-11-12 NOTE — Assessment & Plan Note (Signed)
 Continue statin

## 2024-11-12 NOTE — Assessment & Plan Note (Signed)
 Continue cpap.

## 2024-11-12 NOTE — Assessment & Plan Note (Signed)
Order sliding scale  

## 2024-11-12 NOTE — ED Triage Notes (Signed)
 Pt from [facility]. Pt has a hemoglobin of 6.5. Pt here for transfusion. Hx of COPD. Pt on 3L [place] at baseline.

## 2024-11-12 NOTE — Assessment & Plan Note (Signed)
 Soft blood pressures allow permissive hypertension

## 2024-11-12 NOTE — H&P (Signed)
 "    [name] FMW:[id number] DOB: 1941-04-21 DOA: 11/12/2024     PCP: [name] LABOR, MD   Outpatient Specialists:   CARDS:  Dr. Victory LELON Claudene DOUGLAS, MD (Inactive)    Oncology  Dr. [name] [facility]rology    Patient arrived to ER on 11/12/24 at 2000 Referred by Attending Lenor Hollering, MD   Patient coming from:    From facility   [facility]     Chief Complaint:   Chief Complaint  Patient presents with   Low hemoglobin    HPI: Jorge Sherman is a 83 y.o. male with medical history significant of myelodysplastic syndrome, DM2, essential tremor, sleep apnea, hypertension    Presented with   fatigue and drop in hemoglobin Patient coming from  [facility]  with fatigue and hg was found to be 6.5 on repeat in er 6.2  Hx of Myelodysplastic syndrome with excess blast  transfusion dependent Followed by Dr.  [name] currently on chemotherapy currently undergoing treatment with Vidaza  (azacitidine )   Has been admitted to [facility] for UTI in End November He gets recurrent UTIs for which she takes prophylactic antibiotics and followed by urology   Was blood transfusions for ordered December 16 Patient usually can tell when his blood counts gets low because he gets fatigued. He has been on antibiotics for recurrent UTIs When he does get a UTI he can develop significant delirium It seems that yesterday he was started on Levaquin  to be taking every evening to treat UTI possibly?     Family is worried about pneumonia and requested chest x-ray Denies significant ETOH intake   Does not smoke     Regarding pertinent Chronic problems:    Hyperlipidemia -  on statins Lipitor  (atorvastatin )  Lipid Panel     Component Value Date/Time   TRIG 406 (H) 02/15/2021 0819     last echo  Recent Results (from the past 56199 hours)  ECHOCARDIOGRAM COMPLETE   Collection Time: 10/08/24  9:42 AM  Result Value   Weight 3,232   Height 70   BP 141/91   S'  Lateral 2.70   AR max vel 0.89   AV Area VTI 0.87   AV Mean grad 52.0   AV Peak grad 76.4   Ao pk vel 4.37   AV Area mean vel 0.82   Est EF 65 - 70%   Narrative      ECHOCARDIOGRAM REPORT         1. Left ventricular ejection fraction, by estimation, is 65 to 70%. The left ventricle has normal function. Indeterminate diastolic filling due to E-A fusion.  2. Right ventricular systolic function is normal. The right ventricular size is normal.  3. Left atrial size was mildly dilated.  4. Trivial mitral valve regurgitation.  5. AV is thickened, calcified with restricted motion. Peak and mean gradients through the valve are 76 and 52 mm HG respectively AVA (VTI) is 0.81 cm2. Dimensionless valve indext is 0.23 OVerall consistent with severe AS.SABRA The aortic valve has an  indeterminant number of cusps. Aortic valve regurgitation is not visualized.               BPH - on Flomax , Proscar       Chronic anemia - baseline hg Hemoglobin & Hematocrit  Recent Labs    10/28/24 0945 11/04/24 1254 11/12/24 2027  HGB 9.1* 6.6* 6.2*   Iron/TIBC/Ferritin/ %Sat    Component Value Date/Time  IRON 139 08/09/2024 0407   TIBC 157 (L) 08/09/2024 0407   FERRITIN 591 (H) 08/09/2024 0407   IRONPCTSAT 89 (H) 08/09/2024 0407    Cancer: History of myelo dysplasia followed by Dr. Gatha   While in ER:   Found to be anemic hg 6.4 plan to transfuse     Lab Orders         Basic metabolic panel         CBC with Differential       CXR -atelectasis    Following Medications were ordered in ER: Medications  0.9 %  sodium chloride  infusion (Manually program via Guardrails IV Fluids) (has no administration in time range)    ______    ED Triage Vitals  Encounter Vitals Group     BP 11/12/24 2028 105/64     Girls Systolic BP Percentile --      Girls Diastolic BP Percentile --      Boys Systolic BP Percentile --      Boys Diastolic BP Percentile --      Pulse Rate 11/12/24 2015 91     Resp  11/12/24 2015 15     Temp 11/12/24 2015 98.5 F (36.9 C)     Temp Source 11/12/24 2015 Oral     SpO2 11/12/24 2015 100 %     Weight --      Height --      Head Circumference --      Peak Flow --      Pain Score --      Pain Loc --      Pain Education --      Exclude from Growth Chart --   UFJK(75)@     _________________________________________ Significant initial  Findings: Abnormal Labs Reviewed  BASIC METABOLIC PANEL WITH GFR - Abnormal; Notable for the following components:      Result Value   Sodium 131 (*)    Glucose, Bld 106 (*)    BUN 30 (*)    Calcium  8.7 (*)    All other components within normal limits  CBC WITH DIFFERENTIAL/PLATELET - Abnormal; Notable for the following components:   WBC 2.2 (*)    RBC 2.12 (*)    Hemoglobin 6.2 (*)    HCT 19.4 (*)    Platelets 96 (*)    Neutro Abs 0.7 (*)    All other components within normal limits    Cardiac Panel (last 3 results) Recent Labs    11/12/24 2027  CKTOTAL 33*     ECG: Ordered Personally reviewed and interpreted by me showing: HR : 83 Rhythm: Normal sinus rhythm LAD, consider left anterior fascicular block Abnormal R-wave progression, late transition Nonspecific T abnormalities, lateral leads Borderline prolonged QT interval QTC 482   The recent clinical data is shown below. Vitals:   11/12/24 2015 11/12/24 2028  BP:  105/64  Pulse: 91 81  Resp: 15 (!) 21  Temp: 98.5 F (36.9 C)   TempSrc: Oral   SpO2: 100% 100%    WBC     Component Value Date/Time   WBC 2.2 (L) 11/12/2024 2027   LYMPHSABS 1.3 11/12/2024 2027   MONOABS 0.1 11/12/2024 2027   EOSABS 0.0 11/12/2024 2027   BASOSABS 0.0 11/12/2024 2027    Lactic Acid, Venous    Component Value Date/Time   LATICACIDVEN 1.1 02/14/2021 2048       UA   evidence of UTI     Urine analysis:    Component  Value Date/Time   COLORURINE YELLOW 11/12/2024 2334   APPEARANCEUR HAZY (A) 11/12/2024 2334   LABSPEC 1.006 11/12/2024 2334   PHURINE 7.0  11/12/2024 2334   GLUCOSEU NEGATIVE 11/12/2024 2334   HGBUR NEGATIVE 11/12/2024 2334   BILIRUBINUR NEGATIVE 11/12/2024 2334   KETONESUR NEGATIVE 11/12/2024 2334   PROTEINUR NEGATIVE 11/12/2024 2334   NITRITE NEGATIVE 11/12/2024 2334   LEUKOCYTESUR LARGE (A) 11/12/2024 2334    Results for orders placed or performed during the hospital encounter of 10/06/24  Urine Culture     Status: Abnormal   Collection Time: 10/06/24  8:28 PM   Specimen: Urine, Random  Result Value Ref Range Status   Specimen Description   Final    URINE, RANDOM Performed at [facility], [address], [place], [place] [address]    Special Requests   Final    NONE Reflexed from [id number] Performed at [facility], [address] [place], [place] [address]    Culture MULTIPLE SPECIES PRESENT, SUGGEST RECOLLECTION (A)  Final   Report Status 10/08/2024 FINAL  Final  Resp panel by RT-PCR (RSV, Flu A&B, Covid) Anterior Nasal Swab     Status: None   Collection Time: 10/06/24  9:09 PM   Specimen: Anterior Nasal Swab      __________________________________________________________ Recent Labs  Lab 11/12/24 2027  NA 131*  K 4.6  CO2 24  GLUCOSE 106*  BUN 30*  CREATININE 1.07  CALCIUM  8.7*    Cr  stable,    Lab Results  Component Value Date   CREATININE 1.07 11/12/2024   CREATININE 0.98 10/28/2024   CREATININE 0.85 10/08/2024    Recent Labs  Lab 11/12/24 2027  AST 17  ALT 9  ALKPHOS 55  BILITOT 0.3  PROT 6.5  ALBUMIN 3.6   Lab Results  Component Value Date   CALCIUM  8.7 (L) 11/12/2024   PHOS 3.1 10/08/2024    Plt: Lab Results  Component Value Date   PLT 96 (L) 11/12/2024    Recent Labs  Lab 11/12/24 2027  WBC 2.2*  NEUTROABS 0.7*  HGB 6.2*  HCT 19.4*  MCV 91.5  PLT 96*    HG/HCT Down from baseline see below    Component Value Date/Time   HGB 6.2 (LL) 11/12/2024 2027   HGB 6.6 (LL) 11/04/2024 1254   HCT 19.4 (L) 11/12/2024 2027   MCV  91.5 11/12/2024 2027     _____________________________ Hospitalist was called for admission for   Anemia,     The following Work up has been ordered so far:  Orders Placed This Encounter  Procedures   Basic metabolic panel   CBC with Differential   Informed Consent Details: Engineer, Structural; Transcribe to consent form and obtain patient signature   Consult to hospitalist   Type and screen [facility]   Prepare RBC (crossmatch)     OTHER Significant initial  Findings:  labs showing:     DM  labs:  HbA1C: Recent Labs    10/07/24 0057  HGBA1C 5.6         Cultures:    Component Value Date/Time   SDES  10/06/2024 2028    URINE, RANDOM Performed at [facility], [address] [place], [place] [address]    SPECREQUEST  10/06/2024 2028    NONE Reflexed from [id number] Performed at [facility], [address], [place], [place] [address]    CULT MULTIPLE SPECIES PRESENT, SUGGEST RECOLLECTION (A) 10/06/2024  2028   REPTSTATUS 10/08/2024 FINAL 10/06/2024 2028     Radiological Exams on Admission: No results found. _______________________________________________________________________________________________________ Latest  Blood pressure 105/64, pulse 81, temperature 98.5 F (36.9 C), temperature source Oral, resp. rate (!) 21, SpO2 100%.   Vitals  labs and radiology finding personally reviewed  Review of Systems:    Pertinent positives include:  cough  Constitutional:  No weight loss, night sweats, Fevers, chills, fatigue, weight loss  HEENT:  No headaches, Difficulty swallowing,Tooth/dental problems,Sore throat,  No sneezing, itching, ear ache, nasal congestion, post nasal drip,  Cardio-vascular:  No chest pain, Orthopnea, PND, anasarca, dizziness, palpitations.no Bilateral lower extremity swelling  GI:  No heartburn, indigestion, abdominal pain, nausea, vomiting, diarrhea, change in bowel  habits, loss of appetite, melena, blood in stool, hematemesis Resp:  no shortness of breath at rest. No dyspnea on exertion, No excess mucus, no productive cough, No non-productive cough, No coughing up of blood.No change in color of mucus.No wheezing. Skin:  no rash or lesions. No jaundice GU:  no dysuria, change in color of urine, no urgency or frequency. No straining to urinate.  No flank pain.  Musculoskeletal:  No joint pain or no joint swelling. No decreased range of motion. No back pain.  Psych:  No change in mood or affect. No depression or anxiety. No memory loss.  Neuro: no localizing neurological complaints, no tingling, no weakness, no double vision, no gait abnormality, no slurred speech, no confusion  All systems reviewed and apart from HOPI all are negative _______________________________________________________________________________________________ Past Medical History:   Past Medical History:  Diagnosis Date   Anemia    Chronic coronary artery disease    Chronic pain    Depressive disorder, not elsewhere classified    Dizziness and giddiness    Essential and other specified forms of tremor    Headache(784.0)    History of blood transfusion 11/05/2024   Leukocytosis, unspecified    Mixed hyperlipidemia    Myelodysplastic syndrome (HCC)    Nonrheumatic aortic (valve) stenosis    moderate to severe aortic stenosis   Obstructive sleep apnea (adult) (pediatric)    Pain in joint, lower leg    Septic joint of left knee joint (HCC) 04/22/2019   Spinal stenosis, lumbar region, without neurogenic claudication    Thoracic or lumbosacral neuritis or radiculitis, unspecified    Type II or unspecified type diabetes mellitus without mention of complication, not stated as uncontrolled      Past Surgical History:  Procedure Laterality Date   BACK SURGERY     back surgey     times 3   GIVENS CAPSULE STUDY N/A 08/09/2024   Procedure: IMAGING PROCEDURE, GI TRACT,  INTRALUMINAL, VIA CAPSULE;  Surgeon: Rosalie Kitchens, MD;  Location: [facility] ENDOSCOPY;  Service: Gastroenterology;  Laterality: N/A;   IR BONE MARROW BIOPSY & ASPIRATION  08/14/2024   IR INJECT/THERA/INC NEEDLE/CATH/PLC EPI/LUMB/SAC W/IMG  11/26/2020    Social History:   Ambulatory wheelchair bound,       reports that he has quit smoking. He has never used smokeless tobacco. He reports that he does not drink alcohol  and does not use drugs.   Family History:   Family History  Problem Relation Age of Onset   Cancer Father    Heart disease Father    Stroke Mother    Diabetes Mother    ___________________________________________ Allergies: Allergies[1]   Prior to Admission medications  Medication Sig Start Date End Date Taking? Authorizing Provider  acetaminophen  (TYLENOL ) 500 MG  tablet Take 2 tablets (1,000 mg total) by mouth every 6 (six) hours as needed for headache, mild pain or fever. Patient taking differently: Take 1,000 mg by mouth every 8 (eight) hours as needed for headache, mild pain (pain score 1-3) or fever. 11/28/20   Patsy Lenis, MD  albuterol  (VENTOLIN  HFA) 108 (90 Base) MCG/ACT inhaler Inhale 2 puffs into the lungs every 4 (four) hours as needed for wheezing or shortness of breath. 11/03/20   Pokhrel, Laxman, MD  alum & mag hydroxide-simeth (MAALOX PLUS) 400-400-40 MG/5ML suspension Take 30 mLs by mouth every 4 (four) hours as needed for indigestion.    [provider]  ascorbic acid  (VITAMIN C ) 500 MG tablet Take 500 mg by mouth daily.    [provider]  atorvastatin  (LIPITOR ) 80 MG tablet Take 80 mg by mouth at bedtime. 12/14/20   [provider]  busPIRone  (BUSPAR ) 15 MG tablet Take 15 mg by mouth 3 (three) times daily. 07/02/24   [provider]  Cranberry 450 MG TABS Take 450 mg by mouth in the morning.    [provider]  Cyanocobalamin  5000 MCG TBDP Place 5,000 mcg under the tongue every morning.    [provider]   cyclobenzaprine  (FLEXERIL ) 10 MG tablet Take 10 mg by mouth 3 (three) times daily as needed for muscle spasms.    [provider]  Docusate Sodium  (DSS) 100 MG CAPS Take 100 mg by mouth in the morning and at bedtime.    [provider]  DULoxetine  (CYMBALTA ) 60 MG capsule Take 60 mg by mouth daily. 07/14/24   [provider]  ferrous sulfate  325 (65 FE) MG tablet Take 325 mg by mouth daily.     [provider]  fluticasone  (FLONASE ) 50 MCG/ACT nasal spray Place 1 spray into the nose daily. 06/04/22   [provider]  folic acid  (FOLVITE ) 400 MCG tablet Take 400 mcg by mouth every morning.    [provider]  guaiFENesin  (ROBITUSSIN) 100 MG/5ML liquid Take 10 mLs by mouth every 4 (four) hours as needed for cough.    [provider]  [Paused] hydroxychloroquine  (PLAQUENIL ) 200 MG tablet Take 200 mg by mouth 2 (two) times daily. Wait to take this until your doctor or other care provider tells you to start again.    [provider]  Hypromellose (NATURAL BALANCE TEARS OP) Place 2 drops into both eyes in the morning and at bedtime.    [provider]  LINZESS  145 MCG CAPS capsule Take 145 mcg by mouth daily. 07/11/24   [provider]  loratadine  (CLARITIN ) 10 MG tablet Take 10 mg by mouth in the morning.    [provider]  LORazepam  (ATIVAN ) 0.5 MG tablet Take 0.5 mg by mouth 2 (two) times daily. 07/02/24   [provider]  morphine  (MSIR) 15 MG tablet Take 15 mg by mouth every 8 (eight) hours as needed for severe pain (pain score 7-10).    [provider]  Multiple Vitamin (MULTIVITAMIN WITH MINERALS) TABS tablet Take 1 tablet by mouth daily. 11/29/20   Patsy Lenis, MD  ondansetron  (ZOFRAN ) 4 MG tablet Take 1 tablet (4 mg total) by mouth every 6 (six) hours as needed for nausea. 07/04/20   Will Almarie MATSU, MD  pantoprazole  (PROTONIX ) 40 MG tablet Take 1 tablet (40 mg total) by mouth  daily. 11/29/20   Patsy Lenis, MD  polyethylene glycol (MIRALAX  / GLYCOLAX ) 17 g packet Take 17 g by mouth  daily as needed for mild constipation. 11/03/20   Pokhrel, Laxman, MD  pregabalin  (LYRICA ) 75 MG capsule Take 75 mg by mouth 2 (two) times daily. 07/02/24   [provider]  sennosides-docusate sodium  (SENOKOT-S) 8.6-50 MG tablet Take 2 tablets by mouth in the morning and at bedtime.    [provider]  SUMAtriptan  (IMITREX ) 50 MG tablet Take 50 mg by mouth every 2 (two) hours as needed for migraine or headache. 07/01/24   [provider]  tamsulosin  (FLOMAX ) 0.4 MG CAPS capsule Take 0.4 mg by mouth every morning.    [provider]  thiamine  100 MG tablet Take 1 tablet (100 mg total) by mouth daily. 02/25/21   Cheryle Page, MD    ___________________________________________________________________________________________________ Physical Exam:    11/12/2024    8:28 PM 11/12/2024    8:15 PM 11/05/2024    3:45 PM  Vitals with BMI  Systolic 105  134  Diastolic 64  81  Pulse 81 91 90     1. General:  in No  Acute distress    Chronically ill  -appearing 2. Psychological: Alert and   Oriented 3. Head/ENT:   Moist  Mucous Membranes                          Head Non traumatic, neck supple                        Poor Dentition 4. SKIN:  decreased Skin turgor,  Skin clean Dry and intact no rash    5. Heart: Regular rate and rhythm no  Murmur, no Rub or gallop 6. Lungs:   no wheezes or crackles   7. Abdomen: Soft,  non-tender, Non distended  obese  bowel sounds present 8. Lower extremities: no clubbing, cyanosis, no  edema 9. Neurologically Grossly intact, moving all 4 extremities equally   10. MSK: Normal range of motion    Chart has been reviewed  ______ _____________________________________  Assessment/Plan  83 y.o. male with medical history significant of myelodysplastic syndrome, DM2, essential tremor, sleep apnea, hypertension   Admitted  for  symptomatic Anemia UTI   Present on Admission:  Symptomatic anemia  Mixed hyperlipidemia due to type 2 diabetes mellitus (HCC)  Anemia associated with chemotherapy  Coronary artery disease involving native coronary artery of native heart without angina pectoris  Essential hypertension, benign  Myelodysplastic syndrome (HCC)  Obstructive sleep apnea  Thrombocytopenia  UTI (urinary tract infection)     Mixed hyperlipidemia due to type 2 diabetes mellitus (HCC) Continue Lipitor  80 mg a day  Anemia associated with chemotherapy Transfuse 1 unit and recheck CBC CBC with differential obtained no evidence of blasts  Coronary artery disease involving native coronary artery of native heart without angina pectoris Continue statin  Essential hypertension, benign Soft blood pressures allow permissive hypertension  Myelodysplastic syndrome (HCC) Followed by oncology as an outpatient no evidence of blasts on CBC w dif  Obstructive sleep apnea Continue cpap  Symptomatic anemia Will transfuse 1 unit and recheck  Type 2 diabetes mellitus without complication, with long-term current use of insulin  (HCC) Order sliding scale  Thrombocytopenia Chronic, follow plt count  UTI (urinary tract infection) Continue Levaquin  await results of urine cultures   Other plan as per orders.  DVT prophylaxis:  SCD      Code Status:  DNR/DNI   as per patient   I had personally discussed CODE STATUS with patient  ACP   none   Family Communication:   Family not at  Bedside    Diet heart healthy   Disposition Plan:        Back to current facility when stable     Following barriers for discharge:                                                      Electrolytes corrected                               Anemia  stable                                Consult Orders  (From admission, onward)           Start     Ordered   11/12/24 2202  Consult to hospitalist  Once       Provider:   (Not yet assigned)  Question Answer Comment  Place call to: [facility]   Reason for Consult Admit      11/12/24 2201                               Would benefit from PT/OT eval prior to DC  Ordered                                       Transition of care consulted                                    Consults called: sent email to oncology    Admission status:  ED Disposition     ED Disposition  Admit   Condition  --   Comment  Hospital Area: [facility] [[id number]]  Level of Care: Progressive [102]  Admit to Progressive based on following criteria: CARDIOVASCULAR & THORACIC of moderate stability with acute coronary syndrome symptoms/low risk myocardial infarction/hypertensive urgency/arrhythmias/heart failure potentially compromising stability and stable post cardiovascular intervention patients.  May place patient in observation at [facility] or [facility] if equivalent level of care is available:: No  Diagnosis: Symptomatic anemia [[id number]]  Admitting Physician: Charlye Spare [[id number]]  Attending Physician: Stacey Maura [[id number]]          Obs     Level of care       progressive     Terrea Bruster 11/13/2024, 2:06 AM    [facility]     after 2 AM please page floor coverage   If 7AM-7PM, please contact the day team taking care of the patient using [email or web address]        [1]  Allergies Allergen Reactions   Cefepime Rash    Redness   Iodinated Contrast Media Dermatitis, Hives and Other (See Comments)    A few hives post CT     Desc: A FEW HIVES POST CT, IODINE   Iohexol  Hives and Other (See Comments)    A few hives post CT   Tositumomab  Fentanyl  Itching, Nausea And Vomiting and Other (See Comments)    Patient states that it happened over 30 yrs ago   Iodine Hives and Rash   "

## 2024-11-12 NOTE — Assessment & Plan Note (Signed)
 Chronic, follow plt count

## 2024-11-12 NOTE — Patient Instructions (Addendum)
 Preop instructions for:   Jorge Sherman   Date of Birth: February 22, 1941                       Date of Procedure: Jan. 12, 2026   Procedure: CIRCUMCISION, ADULT      Surgeon: Dr. Sherwood Edison Facility contact: [name]     Phone:  [phone]               Health Care POA: RN contact name/phone#:                          and Fax #:    Transportation contact phone#:   Please send day of procedure:  Current med list  Medications taken the day of procedure (return attached form to hospital) confirm time of nothing by mouth status (return attached form to hospital) Patient Demographic info( to include DNR status, problem list, allergies) Bring Insurance card and picture ID    [facility] to [facility] at [facility] 7:45 AM   Report to: Admitting (On your left hand side)    Do not eat solid food or drink past midnight the night before your procedure.(To include any tube feedings-must be discontinued)   Take these morning medications only with sips of water .(or give through gastrostomy or feeding tube).  Tylenol  if needed Inhaler if needed and bring day of surgery Buspirone  Docusate Duloxetine  May use Flonase  May use eyedrops Loratadine  Lorazepam  Morphine  if needed and able to tolerate on empty stomach Pantoprazole  Pregablin Senokot Tamsulosin  Linzess   Stop all vitamins and herbal supplements 7 days before surgery.   The Day Before Surgery:    Note: No Insulin  or Diabetic meds should be given or taken the morning of the procedure!  Oral Hygiene is also important to reduce your risk of infection.                                    Remember - BRUSH YOUR TEETH THE MORNING OF SURGERY WITH YOUR REGULAR TOOTHPASTE   DENTURES WILL BE REMOVED PRIOR TO SURGERY PLEASE DO NOT APPLY Poly grip OR ADHESIVES!!!  Leave all jewelry and other valuables at place where living( no metal or rings to be worn) No contact lens Men-no colognes,lotions   Any  questions day of procedure,call  SHORT STAY-[phone]     Sent from :[facility]                   Phone:[phone]                   [phone]   Sent by : Divine Hansley BSN, RN

## 2024-11-12 NOTE — ED Provider Notes (Signed)
 " [facility] EMERGENCY DEPARTMENT AT [facility] Provider Note   CSN: [id number] Arrival date & time: 11/12/24  2000     Patient presents with: Low hemoglobin   Jorge Sherman is a 83 y.o. male.   Patient is a 83 year old who presents with fatigue and low hemoglobin.  Jorge Sherman has a history of myelodysplastic syndrome and periodically has to get blood transfusions.  Jorge Sherman says Jorge Sherman can feel when his hemoglobin is getting low because Jorge Sherman has a little anxiety.  Jorge Sherman does have some intermittent shortness of breath but Jorge Sherman says Jorge Sherman has a history of COPD.  No chest pain.  No melena or visible blood in his stool.  His nursing facility had reported that his hemoglobin was 6.5.  Jorge Sherman was sent here for a blood transfusion.       Prior to Admission medications  Medication Sig Start Date End Date Taking? Authorizing Provider  acetaminophen  (TYLENOL ) 500 MG tablet Take 2 tablets (1,000 mg total) by mouth every 6 (six) hours as needed for headache, mild pain or fever. Patient taking differently: Take 1,000 mg by mouth every 8 (eight) hours as needed for headache, mild pain (pain score 1-3) or fever. 11/28/20   Patsy Lenis, MD  albuterol  (VENTOLIN  HFA) 108 [id number] Base) MCG/ACT inhaler Inhale 2 puffs into the lungs every 4 (four) hours as needed for wheezing or shortness of breath. 11/03/20   Pokhrel, Laxman, MD  alum & mag hydroxide-simeth (MAALOX PLUS) 400-400-40 MG/5ML suspension Take 30 mLs by mouth every 4 (four) hours as needed for indigestion.    [provider]  ascorbic acid  (VITAMIN C ) 500 MG tablet Take 500 mg by mouth daily.    [provider]  atorvastatin  (LIPITOR ) 80 MG tablet Take 80 mg by mouth at bedtime. 12/14/20   [provider]  busPIRone  (BUSPAR ) 15 MG tablet Take 15 mg by mouth 3 (three) times daily. 07/02/24   [provider]  Cranberry 450 MG TABS Take 450 mg by mouth in the morning.    [provider]  Cyanocobalamin  5000 MCG TBDP Place  5,000 mcg under the tongue every morning.    [provider]  cyclobenzaprine  (FLEXERIL ) 10 MG tablet Take 10 mg by mouth 3 (three) times daily as needed for muscle spasms.    [provider]  Docusate Sodium  (DSS) 100 MG CAPS Take 100 mg by mouth in the morning and at bedtime.    [provider]  DULoxetine  (CYMBALTA ) 60 MG capsule Take 60 mg by mouth daily. 07/14/24   [provider]  ferrous sulfate  325 (65 FE) MG tablet Take 325 mg by mouth daily.     [provider]  fluticasone  (FLONASE ) 50 MCG/ACT nasal spray Place 1 spray into the nose daily. 06/04/22   [provider]  folic acid  (FOLVITE ) 400 MCG tablet Take 400 mcg by mouth every morning.    [provider]  guaiFENesin  (ROBITUSSIN) 100 MG/5ML liquid Take 10 mLs by mouth every 4 (four) hours as needed for cough.    [provider]  [Paused] hydroxychloroquine  (PLAQUENIL ) 200 MG tablet Take 200 mg by mouth 2 (two) times daily. Wait to take this until your doctor or other care provider tells you to start again.    [provider]  Hypromellose (NATURAL BALANCE TEARS OP) Place 2 drops into both eyes in the morning and at bedtime.    [provider]  LINZESS  145 MCG CAPS capsule Take 145 mcg by  mouth daily. 07/11/24   [provider]  loratadine  (CLARITIN ) 10 MG tablet Take 10 mg by mouth in the morning.    [provider]  LORazepam  (ATIVAN ) 0.5 MG tablet Take 0.5 mg by mouth 2 (two) times daily. 07/02/24   [provider]  morphine  (MSIR) 15 MG tablet Take 15 mg by mouth every 8 (eight) hours as needed for severe pain (pain score 7-10).    [provider]  Multiple Vitamin (MULTIVITAMIN WITH MINERALS) TABS tablet Take 1 tablet by mouth daily. 11/29/20   Patsy Lenis, MD  ondansetron  (ZOFRAN ) 4 MG tablet Take 1 tablet (4 mg total) by mouth every 6 (six) hours as needed for nausea. 07/04/20   Will Almarie MATSU, MD   pantoprazole  (PROTONIX ) 40 MG tablet Take 1 tablet (40 mg total) by mouth daily. 11/29/20   Patsy Lenis, MD  polyethylene glycol (MIRALAX  / GLYCOLAX ) 17 g packet Take 17 g by mouth daily as needed for mild constipation. 11/03/20   Pokhrel, Laxman, MD  pregabalin  (LYRICA ) 75 MG capsule Take 75 mg by mouth 2 (two) times daily. 07/02/24   [provider]  sennosides-docusate sodium  (SENOKOT-S) 8.6-50 MG tablet Take 2 tablets by mouth in the morning and at bedtime.    [provider]  SUMAtriptan  (IMITREX ) 50 MG tablet Take 50 mg by mouth every 2 (two) hours as needed for migraine or headache. 07/01/24   [provider]  tamsulosin  (FLOMAX ) 0.4 MG CAPS capsule Take 0.4 mg by mouth every morning.    [provider]  thiamine  100 MG tablet Take 1 tablet (100 mg total) by mouth daily. 02/25/21   Cheryle Page, MD    Allergies: Cefepime, Iodinated contrast media, Iohexol , Tositumomab, Fentanyl , and Iodine    Review of Systems  Constitutional:  Positive for fatigue. Negative for chills, diaphoresis and fever.  HENT:  Negative for congestion, rhinorrhea and sneezing.   Eyes: Negative.   Respiratory:  Positive for shortness of breath. Negative for cough and chest tightness.   Cardiovascular:  Negative for chest pain and leg swelling.  Gastrointestinal:  Negative for abdominal pain, blood in stool, diarrhea, nausea and vomiting.  Genitourinary:  Negative for difficulty urinating and hematuria.  Musculoskeletal:  Negative for arthralgias and back pain.  Skin:  Negative for rash.  Neurological:  Negative for dizziness, speech difficulty, weakness, numbness and headaches.    Updated Vital Signs BP 105/64   Pulse 81   Temp 98.5 F (36.9 C) (Oral)   Resp (!) 21   SpO2 100%   Physical Exam Constitutional:      Appearance: Jorge Sherman is well-developed.  HENT:     Head: Normocephalic and atraumatic.  Eyes:     Pupils: Pupils are equal, round, and reactive to light.   Cardiovascular:     Rate and Rhythm: Normal rate and regular rhythm.     Heart sounds: Normal heart sounds.  Pulmonary:     Effort: Pulmonary effort is normal. No respiratory distress.     Breath sounds: Normal breath sounds. No wheezing or rales.  Chest:     Chest wall: No tenderness.  Abdominal:     General: Bowel sounds are normal.     Palpations: Abdomen is soft.     Tenderness: There is no abdominal tenderness. There is no guarding or rebound.  Musculoskeletal:        General: Normal range of motion.     Cervical back: Normal range of motion and neck supple.  Lymphadenopathy:  Cervical: No cervical adenopathy.  Skin:    General: Skin is warm and dry.     Coloration: Skin is pale.     Findings: No rash.  Neurological:     Mental Status: Jorge Sherman is alert and oriented to person, place, and time.     (all labs ordered are listed, but only abnormal results are displayed) Labs Reviewed  BASIC METABOLIC PANEL WITH GFR - Abnormal; Notable for the following components:      Result Value   Sodium 131 (*)    Glucose, Bld 106 (*)    BUN 30 (*)    Calcium  8.7 (*)    All other components within normal limits  CBC WITH DIFFERENTIAL/PLATELET - Abnormal; Notable for the following components:   WBC 2.2 (*)    RBC 2.12 (*)    Hemoglobin 6.2 (*)    HCT 19.4 (*)    Platelets 96 (*)    Neutro Abs 0.7 (*)    All other components within normal limits  TYPE AND SCREEN  PREPARE RBC (CROSSMATCH)    EKG: EKG Interpretation Date/Time:  Tuesday November 12 2024 20:13:01 EST Ventricular Rate:  83 PR Interval:    QRS Duration:  103 QT Interval:  410 QTC Calculation: 482 R Axis:   -48  Text Interpretation: Normal sinus rhythm LAD, consider left anterior fascicular block Abnormal R-wave progression, late transition Nonspecific T abnormalities, lateral leads Borderline prolonged QT interval since last tracing no significant change Confirmed by Lenor Hollering [id number]) on 11/12/2024 9:49:40  PM  Radiology: No results found.   Procedures   Medications Ordered in the ED  0.9 %  sodium chloride  infusion (Manually program via Guardrails IV Fluids) (has no administration in time range)                                    Medical Decision Making Amount and/or Complexity of Data Reviewed Labs: ordered.  Risk Prescription drug management.   This patient presents to the ED for concern of fatigue, this involves an extensive number of treatment options, and is a complaint that carries with it a high risk of complications and morbidity.  I considered the following differential and admission for this acute, potentially life threatening condition.  The differential diagnosis includes anemia, electrolyte abnormality, hypotension, infection, GI bleed  MDM:    Patient is a 83 year old who presents with fatigue and low hemoglobin.  On chart review, it appears that Jorge Sherman has chronic anemia related to his myelodysplastic syndrome.  Jorge Sherman has recently had an EGD by GI and it was not felt to be related to a GI loss.  His hemoglobin today is 6.2.  Jorge Sherman has been typed and crossed for 2 units of packed red cells.  His blood pressure is borderline low.  Will plan admission for transfusion and monitoring. Discussed with Dr. Silvester.  CRITICAL CARE Performed by: Hollering Lenor Total critical care time: 60 minutes Critical care time was exclusive of separately billable procedures and treating other patients. Critical care was necessary to treat or prevent imminent or life-threatening deterioration. Critical care was time spent personally by me on the following activities: development of treatment plan with patient and/or surrogate as well as nursing, discussions with consultants, evaluation of patient's response to treatment, examination of patient, obtaining history from patient or surrogate, ordering and performing treatments and interventions, ordering and review of laboratory studies, ordering and review  of radiographic  studies, pulse oximetry and re-evaluation of patient's condition.   (Labs, imaging, consults)  Labs: I Ordered, and personally interpreted labs.  The pertinent results include: Anemia  Imaging Studies ordered: I ordered imaging studies including   I independently visualized and interpreted imaging. I agree with the radiologist interpretation  Additional history obtained from  .  External records from outside source obtained and reviewed including prior history, treatments  Cardiac Monitoring: The patient was maintained on a cardiac monitor.  If on the cardiac monitor, I personally viewed and interpreted the cardiac monitored which showed an underlying rhythm of: Sinus rhythm  Reevaluation: After the interventions noted above, I reevaluated the patient and found that they have :improved  Social Determinants of Health:    Disposition: Admit to hospital  Co morbidities that complicate the patient evaluation  Past Medical History:  Diagnosis Date   Anemia    Chronic coronary artery disease    Chronic pain    Depressive disorder, not elsewhere classified    Dizziness and giddiness    Essential and other specified forms of tremor    Headache(784.0)    History of blood transfusion 11/05/2024   Leukocytosis, unspecified    Mixed hyperlipidemia    Myelodysplastic syndrome (HCC)    Nonrheumatic aortic (valve) stenosis    moderate to severe aortic stenosis   Obstructive sleep apnea (adult) (pediatric)    Pain in joint, lower leg    Septic joint of left knee joint (HCC) 04/22/2019   Spinal stenosis, lumbar region, without neurogenic claudication    Thoracic or lumbosacral neuritis or radiculitis, unspecified    Type II or unspecified type diabetes mellitus without mention of complication, not stated as uncontrolled      Medicines Meds ordered this encounter  Medications   0.9 %  sodium chloride  infusion (Manually program via Guardrails IV Fluids)    I have  reviewed the patients home medicines and have made adjustments as needed  Problem List / ED Course: Problem List Items Addressed This Visit   None Visit Diagnoses       Anemia, unspecified type    -  Primary                Final diagnoses:  Anemia, unspecified type    ED Discharge Orders     None          Lenor Hollering, MD 11/12/24 2209  "

## 2024-11-12 NOTE — Assessment & Plan Note (Signed)
 Followed by oncology as an outpatient no evidence of blasts on CBC w dif

## 2024-11-12 NOTE — Assessment & Plan Note (Addendum)
 Transfuse 1 unit and recheck CBC CBC with differential obtained no evidence of blasts

## 2024-11-12 NOTE — Assessment & Plan Note (Signed)
Continue Lipitor 80 mg a day.

## 2024-11-13 ENCOUNTER — Observation Stay (HOSPITAL_COMMUNITY)

## 2024-11-13 LAB — COMPREHENSIVE METABOLIC PANEL WITH GFR
ALT: 9 U/L (ref 0–44)
AST: 16 U/L (ref 15–41)
Albumin: 3.4 g/dL — ABNORMAL LOW (ref 3.5–5.0)
Alkaline Phosphatase: 54 U/L (ref 38–126)
Anion gap: 6 (ref 5–15)
BUN: 25 mg/dL — ABNORMAL HIGH (ref 8–23)
CO2: 26 mmol/L (ref 22–32)
Calcium: 8.9 mg/dL (ref 8.9–10.3)
Chloride: 104 mmol/L (ref 98–111)
Creatinine, Ser: 0.9 mg/dL (ref 0.61–1.24)
GFR, Estimated: >60 mL/min
Glucose, Bld: 124 mg/dL — ABNORMAL HIGH (ref 70–99)
Potassium: 3.7 mmol/L (ref 3.5–5.1)
Sodium: 137 mmol/L (ref 135–145)
Total Bilirubin: 0.7 mg/dL (ref 0.0–1.2)
Total Protein: 6.2 g/dL — ABNORMAL LOW (ref 6.5–8.1)

## 2024-11-13 LAB — RESP PANEL BY RT-PCR (RSV, FLU A&B, COVID)  RVPGX2
Influenza A by PCR: NEGATIVE
Influenza B by PCR: NEGATIVE
Resp Syncytial Virus by PCR: NEGATIVE
SARS Coronavirus 2 by RT PCR: NEGATIVE

## 2024-11-13 LAB — CBC
HCT: 25 % — ABNORMAL LOW (ref 39.0–52.0)
Hemoglobin: 8 g/dL — ABNORMAL LOW (ref 13.0–17.0)
MCH: 29.2 pg (ref 26.0–34.0)
MCHC: 32 g/dL (ref 30.0–36.0)
MCV: 91.2 fL (ref 80.0–100.0)
Platelets: 90 K/uL — ABNORMAL LOW (ref 150–400)
RBC: 2.74 MIL/uL — ABNORMAL LOW (ref 4.22–5.81)
RDW: 14.6 % (ref 11.5–15.5)
WBC: 1.6 K/uL — ABNORMAL LOW (ref 4.0–10.5)
nRBC: 0 % (ref 0.0–0.2)

## 2024-11-13 LAB — URINALYSIS, COMPLETE (UACMP) WITH MICROSCOPIC
Bilirubin Urine: NEGATIVE
Glucose, UA: NEGATIVE mg/dL
Hgb urine dipstick: NEGATIVE
Ketones, ur: NEGATIVE mg/dL
Nitrite: NEGATIVE
Protein, ur: NEGATIVE mg/dL
Specific Gravity, Urine: 1.006 (ref 1.005–1.030)
pH: 7 (ref 5.0–8.0)

## 2024-11-13 LAB — SODIUM, URINE, RANDOM: Sodium, Ur: <30 mmol/L

## 2024-11-13 LAB — MAGNESIUM: Magnesium: 2 mg/dL (ref 1.7–2.4)

## 2024-11-13 LAB — CREATININE, URINE, RANDOM: Creatinine, Urine: 23 mg/dL

## 2024-11-13 LAB — IRON AND TIBC
Iron: 86 ug/dL (ref 45–182)
Saturation Ratios: 55 % — ABNORMAL HIGH (ref 17.9–39.5)
TIBC: 155 ug/dL — ABNORMAL LOW (ref 250–450)
UIBC: 70 ug/dL

## 2024-11-13 LAB — OSMOLALITY, URINE: Osmolality, Ur: 214 mosm/kg — ABNORMAL LOW (ref 300–900)

## 2024-11-13 LAB — FOLATE: Folate: 18.2 ng/mL

## 2024-11-13 LAB — CBG MONITORING, ED
Glucose-Capillary: 101 mg/dL — ABNORMAL HIGH (ref 70–99)
Glucose-Capillary: 105 mg/dL — ABNORMAL HIGH (ref 70–99)
Glucose-Capillary: 108 mg/dL — ABNORMAL HIGH (ref 70–99)

## 2024-11-13 LAB — OSMOLALITY: Osmolality: 290 mosm/kg (ref 275–295)

## 2024-11-13 LAB — VITAMIN B12: Vitamin B-12: 1346 pg/mL — ABNORMAL HIGH (ref 180–914)

## 2024-11-13 LAB — FERRITIN: Ferritin: 1155 ng/mL — ABNORMAL HIGH (ref 24–336)

## 2024-11-13 MED ORDER — ONDANSETRON HCL 4 MG PO TABS: 4.0000 mg | ORAL_TABLET | Freq: Four times a day (QID) | ORAL | Status: DC | PRN

## 2024-11-13 MED ORDER — ACETAMINOPHEN 325 MG PO TABS: 650.0000 mg | ORAL_TABLET | Freq: Four times a day (QID) | ORAL | Status: DC | PRN

## 2024-11-13 MED ORDER — BUSPIRONE HCL 10 MG PO TABS
15.0000 mg | ORAL_TABLET | Freq: Three times a day (TID) | ORAL | Status: DC
  Administered 2024-11-13: 15 mg via ORAL
  Filled 2024-11-13: qty 2

## 2024-11-13 MED ORDER — GUAIFENESIN ER 600 MG PO TB12
600.0000 mg | ORAL_TABLET | Freq: Two times a day (BID) | ORAL | Status: DC
  Administered 2024-11-13: 600 mg via ORAL
  Filled 2024-11-13: qty 1

## 2024-11-13 MED ORDER — LINACLOTIDE 145 MCG PO CAPS
145.0000 ug | ORAL_CAPSULE | Freq: Every day | ORAL | Status: DC
  Filled 2024-11-13: qty 1

## 2024-11-13 MED ORDER — PREGABALIN 50 MG PO CAPS
75.0000 mg | ORAL_CAPSULE | Freq: Two times a day (BID) | ORAL | Status: DC
  Administered 2024-11-13: 75 mg via ORAL
  Filled 2024-11-13: qty 1

## 2024-11-13 MED ORDER — ACETAMINOPHEN 650 MG RE SUPP: 650.0000 mg | Freq: Four times a day (QID) | RECTAL | Status: DC | PRN

## 2024-11-13 MED ORDER — DULOXETINE HCL 30 MG PO CPEP
60.0000 mg | ORAL_CAPSULE | Freq: Every day | ORAL | Status: DC
  Administered 2024-11-13: 60 mg via ORAL
  Filled 2024-11-13: qty 2

## 2024-11-13 MED ORDER — LORATADINE 10 MG PO TABS
10.0000 mg | ORAL_TABLET | Freq: Every day | ORAL | Status: DC
  Administered 2024-11-13: 10 mg via ORAL
  Filled 2024-11-13: qty 1

## 2024-11-13 MED ORDER — MORPHINE SULFATE (PF) 2 MG/ML IV SOLN
1.0000 mg | INTRAVENOUS | Status: DC | PRN
  Administered 2024-11-13 (×3): 1 mg via INTRAVENOUS
  Filled 2024-11-13 (×3): qty 1

## 2024-11-13 MED ORDER — SENNOSIDES-DOCUSATE SODIUM 8.6-50 MG PO TABS
2.0000 | ORAL_TABLET | Freq: Every day | ORAL | Status: DC
  Administered 2024-11-13: 2 via ORAL
  Filled 2024-11-13: qty 2

## 2024-11-13 MED ORDER — LORAZEPAM 0.5 MG PO TABS
0.2500 mg | ORAL_TABLET | Freq: Once | ORAL | Status: AC | PRN
  Administered 2024-11-13: 0.25 mg via ORAL
  Filled 2024-11-13: qty 1

## 2024-11-13 MED ORDER — MORPHINE SULFATE 15 MG PO TABS
15.0000 mg | ORAL_TABLET | Freq: Three times a day (TID) | ORAL | Status: DC | PRN
  Administered 2024-11-13: 15 mg via ORAL
  Filled 2024-11-13: qty 1

## 2024-11-13 MED ORDER — ATORVASTATIN CALCIUM 40 MG PO TABS: 80.0000 mg | ORAL_TABLET | Freq: Every day | ORAL | Status: DC

## 2024-11-13 MED ORDER — LEVOFLOXACIN IN D5W 500 MG/100ML IV SOLN
500.0000 mg | INTRAVENOUS | Status: DC
  Administered 2024-11-13: 500 mg via INTRAVENOUS
  Filled 2024-11-13: qty 100

## 2024-11-13 MED ORDER — LORAZEPAM 0.5 MG PO TABS
0.5000 mg | ORAL_TABLET | Freq: Two times a day (BID) | ORAL | Status: DC | PRN
  Administered 2024-11-13: 0.5 mg via ORAL
  Filled 2024-11-13: qty 1

## 2024-11-13 MED ORDER — CYCLOBENZAPRINE HCL 10 MG PO TABS
10.0000 mg | ORAL_TABLET | Freq: Three times a day (TID) | ORAL | Status: DC
  Administered 2024-11-13: 10 mg via ORAL
  Filled 2024-11-13: qty 1

## 2024-11-13 MED ORDER — ALBUTEROL SULFATE (2.5 MG/3ML) 0.083% IN NEBU: 2.5000 mg | INHALATION_SOLUTION | RESPIRATORY_TRACT | Status: DC | PRN

## 2024-11-13 MED ORDER — BENZONATATE 100 MG PO CAPS: 200.0000 mg | ORAL_CAPSULE | Freq: Two times a day (BID) | ORAL | Status: DC | PRN

## 2024-11-13 MED ORDER — TAMSULOSIN HCL 0.4 MG PO CAPS
0.4000 mg | ORAL_CAPSULE | Freq: Every morning | ORAL | Status: DC
  Administered 2024-11-13: 0.4 mg via ORAL
  Filled 2024-11-13: qty 1

## 2024-11-13 MED ORDER — MELATONIN 3 MG PO TABS: 3.0000 mg | ORAL_TABLET | Freq: Every day | ORAL | Status: DC

## 2024-11-13 MED ORDER — FOLIC ACID 1 MG PO TABS
500.0000 ug | ORAL_TABLET | Freq: Every morning | ORAL | Status: DC
  Administered 2024-11-13: 0.5 mg via ORAL
  Filled 2024-11-13: qty 1

## 2024-11-13 MED ORDER — PANTOPRAZOLE SODIUM 40 MG PO TBEC
40.0000 mg | DELAYED_RELEASE_TABLET | Freq: Two times a day (BID) | ORAL | Status: DC
  Administered 2024-11-13: 40 mg via ORAL
  Filled 2024-11-13: qty 1

## 2024-11-13 MED ORDER — ONDANSETRON HCL 4 MG/2ML IJ SOLN: 4.0000 mg | Freq: Four times a day (QID) | INTRAMUSCULAR | Status: DC | PRN

## 2024-11-13 MED ORDER — THIAMINE MONONITRATE 100 MG PO TABS
100.0000 mg | ORAL_TABLET | Freq: Every day | ORAL | Status: DC
  Administered 2024-11-13: 100 mg via ORAL
  Filled 2024-11-13: qty 1

## 2024-11-13 NOTE — ED Notes (Signed)
 MD requests CBC drawn at this time.

## 2024-11-13 NOTE — Assessment & Plan Note (Signed)
 Continue Levaquin  await results of urine cultures

## 2024-11-13 NOTE — Discharge Summary (Signed)
 "  Physician Discharge Summary  Jorge Sherman FMW:[id number] DOB: 1941/11/09 DOA: 11/12/2024  PCP: [name] LABOR, MD  Admit date: 11/12/2024 Discharge date: 11/13/2024  Admitted From: SNF Disposition: SNF Recommendations for Outpatient Follow-up:  Outpatient follow-up with oncology/hematology in 1 to 2 weeks. Check CMP and CBC in 1 week Please follow up on the following pending results: Urine culture   Discharge Condition: Stable CODE STATUS: DNR   Follow-up Information     [name] [name], MD. Schedule an appointment as soon as possible for a visit in 1 week(s).   Specialty: Oncology Contact information: [address] [place] [address] [phone]                 Hospital course 83 year old M with PMH of MDS on chemo, pancytopenia, CAD,, HFpEF, severe aortic stenosis, OSA not on CPAP, DM-2, HTN, depression, anxiety, chronic back pain/radiculopathy on opiate recent UTI for which he was started on Levaquin  and constipation brought to ED due to fatigue and anemia, and admitted for symptomatic anemia.   Patient denies melena or hematochezia.  He is followed by Dr. Gatha and receives blood transfusion for anemia due to chemotherapy and malignancy.  In ED, stable vitals.  Hgb 6.2.  WBC 2.2.  Platelet 96.  Sodium 131.  Otherwise, CMP without significant finding.  Anemia panel without nutritional deficiency.  UA with large LE and many bacteria.  Urine osmolality 214.  Urine sodium less than 30.  COVID-19, influenza and RSV PCR nonreactive.  Two units of blood ordered, and he was admitted for overnight observation.    The next day, hemoglobin improved to 8.0.  Felt better.  He is discharged to follow-up with oncology/hematology in 1 to 2 weeks.  In regards to UTI, continued on home Levaquin .  Urine culture pending but might not grow anything since he is already on antibiotics.  Other chronic comorbidities stable.  Continued on home medications.  See  individual problem list below for more.   Problems addressed during this hospitalization Principal Problem:   Symptomatic anemia Active Problems:   Mixed hyperlipidemia due to type 2 diabetes mellitus (HCC)   Type 2 diabetes mellitus without complication, with long-term current use of insulin  (HCC)   Obstructive sleep apnea   Essential hypertension, benign   Coronary artery disease involving native coronary artery of native heart without angina pectoris   Myelodysplastic syndrome (HCC)   Anemia associated with chemotherapy   UTI (urinary tract infection)   Thrombocytopenia     Body mass index is 28.85 kg/m.           Consultations: None  Time spent 35  minutes  Vital signs Vitals:   11/13/24 0416 11/13/24 0741 11/13/24 0820 11/13/24 1132  BP:  120/83 113/76 (!) 105/94  Pulse:  88 84 88  Temp:  98 F (36.7 C) 97.9 F (36.6 C) 98.3 F (36.8 C)  Resp:  17 19 18   Weight:      SpO2: 97% 100% 100% 100%  TempSrc:  Oral Oral Oral  BMI (Calculated):         Discharge exam  GENERAL: No apparent distress.  Nontoxic. HEENT: MMM.  Vision and hearing grossly intact.  NECK: Supple.  No apparent JVD.  RESP:  No IWOB.  Fair aeration bilaterally. CVS:  RRR. Heart sounds normal.  ABD/GI/GU: BS+. Abd soft, NTND.  MSK/EXT:  Moves extremities. No apparent deformity. No edema.  SKIN: no apparent skin lesion or wound NEURO: Awake and alert. Oriented appropriately.  Dysarthria.  No apparent focal neuro deficit. PSYCH: Calm. Normal affect.   Discharge Instructions Discharge Instructions     Increase activity slowly   Complete by: As directed       Allergies as of 11/13/2024       Reactions   Cefepime Rash   Redness   Iodinated Contrast Media Dermatitis, Hives, Other (See Comments)   A few hives post CT     Desc: A FEW HIVES POST CT, IODINE   Iohexol  Hives, Other (See Comments)   A few hives post CT   Tositumomab    Fentanyl  Itching, Nausea And Vomiting, Other  (See Comments)   Patient states that it happened over 30 yrs ago   Iodine Hives, Rash        Medication List     PAUSE taking these medications    hydroxychloroquine  200 MG tablet Wait to take this until your doctor or other care provider tells you to start again. Commonly known as: PLAQUENIL  Take 200 mg by mouth 2 (two) times daily.       TAKE these medications    acetaminophen  500 MG tablet Commonly known as: TYLENOL  Take 2 tablets (1,000 mg total) by mouth every 6 (six) hours as needed for headache, mild pain or fever. What changed: when to take this   albuterol  108 (90 Base) MCG/ACT inhaler Commonly known as: VENTOLIN  HFA Inhale 2 puffs into the lungs every 4 (four) hours as needed for wheezing or shortness of breath.   alum & mag hydroxide-simeth 400-400-40 MG/5ML suspension Commonly known as: MAALOX PLUS Take 30 mLs by mouth every 4 (four) hours as needed for indigestion.   ascorbic acid  500 MG tablet Commonly known as: VITAMIN C  Take 500 mg by mouth daily.   atorvastatin  80 MG tablet Commonly known as: LIPITOR  Take 80 mg by mouth at bedtime.   benzonatate  100 MG capsule Commonly known as: TESSALON  Take 100 mg by mouth 3 (three) times daily as needed for cough.   busPIRone  15 MG tablet Commonly known as: BUSPAR  Take 15 mg by mouth 3 (three) times daily.   clotrimazole-betamethasone  cream Commonly known as: LOTRISONE Apply topically.   Cranberry 450 MG Tabs Take 450 mg by mouth in the morning.   cyanocobalamin  1000 MCG tablet Commonly known as: VITAMIN B12 Take 5,000 mcg by mouth daily.   cyclobenzaprine  10 MG tablet Commonly known as: FLEXERIL  Take 10 mg by mouth 3 (three) times daily.   DSS 100 MG Caps Take 100 mg by mouth in the morning and at bedtime.   DULoxetine  60 MG capsule Commonly known as: CYMBALTA  Take 60 mg by mouth daily.   ferrous sulfate  325 (65 FE) MG tablet Take 325 mg by mouth daily.   fluticasone  50 MCG/ACT nasal  spray Commonly known as: FLONASE  Place 1 spray into the nose daily.   folic acid  400 MCG tablet Commonly known as: FOLVITE  Take 400 mcg by mouth every morning.   glycerin  adult 2 g suppository Place 1 suppository rectally every 4 (four) hours as needed for constipation.   guaiFENesin  100 MG/5ML liquid Commonly known as: ROBITUSSIN Take 10 mLs by mouth every 4 (four) hours as needed for cough.   levofloxacin  750 MG tablet Commonly known as: LEVAQUIN  Take 750 mg by mouth every evening.   lidocaine  4 % Place 1 patch onto the skin in the morning and at bedtime.   Lidocaine  3 % Crea Apply 1 application  topically every 4 (four) hours as needed.  Linzess  145 MCG Caps capsule Generic drug: linaclotide  Take 145 mcg by mouth daily.   Linzess  72 MCG capsule Generic drug: linaclotide  Take 72 mcg by mouth every morning.   loratadine  10 MG tablet Commonly known as: CLARITIN  Take 10 mg by mouth in the morning.   LORazepam  0.5 MG tablet Commonly known as: ATIVAN  Take 0.5 mg by mouth 2 (two) times daily.   magnesium  hydroxide 400 MG/5ML suspension Commonly known as: MILK OF MAGNESIA Take 30 mLs by mouth daily as needed for mild constipation.   melatonin 3 MG Tabs tablet Take 3 mg by mouth at bedtime.   morphine  15 MG tablet Commonly known as: MSIR Take 15 mg by mouth in the morning, at noon, and at bedtime.   multivitamin with minerals Tabs tablet Take 1 tablet by mouth daily.   NATURAL BALANCE TEARS OP Place 2 drops into both eyes in the morning and at bedtime.   ondansetron  4 MG tablet Commonly known as: ZOFRAN  Take 1 tablet (4 mg total) by mouth every 6 (six) hours as needed for nausea.   pantoprazole  40 MG tablet Commonly known as: PROTONIX  Take 1 tablet (40 mg total) by mouth daily. What changed: when to take this   polyethylene glycol 17 g packet Commonly known as: MIRALAX  / GLYCOLAX  Take 17 g by mouth daily as needed for mild constipation. What changed:  when to take this   pregabalin  75 MG capsule Commonly known as: LYRICA  Take 75 mg by mouth 2 (two) times daily.   sennosides-docusate sodium  8.6-50 MG tablet Commonly known as: SENOKOT-S Take 2 tablets by mouth in the morning and at bedtime.   SUMAtriptan  50 MG tablet Commonly known as: IMITREX  Take 50 mg by mouth every 2 (two) hours as needed for migraine or headache.   tamsulosin  0.4 MG Caps capsule Commonly known as: FLOMAX  Take 0.4 mg by mouth every morning.   thiamine  100 MG tablet Commonly known as: VITAMIN B1 Take 1 tablet (100 mg total) by mouth daily.   Uribel 118 MG Caps Take 1 capsule by mouth every 6 (six) hours.         Procedures/Studies:   DG CHEST PORT 1 VIEW Result Date: 11/13/2024 CLINICAL DATA:  Dyspnea. EXAM: PORTABLE CHEST 1 VIEW COMPARISON:  10/06/2024 FINDINGS: Low lung volumes. Stable heart size and mediastinal contours. Mild atelectasis at the lung bases. No confluent opacity. No pneumothorax or large pleural effusion. Chronic bilateral shoulder arthropathy. IMPRESSION: Low lung volumes with mild bibasilar atelectasis. Electronically Signed   By: Andrea Gasman M.D.   On: 11/13/2024 01:49       The results of significant diagnostics from this hospitalization (including imaging, microbiology, ancillary and laboratory) are listed below for reference.     Microbiology: Recent Results (from the past 240 hours)  Resp panel by RT-PCR (RSV, Flu A&B, Covid) Anterior Nasal Swab     Status: None   Collection Time: 11/13/24  1:22 AM   Specimen: Anterior Nasal Swab  Result Value Ref Range Status   SARS Coronavirus 2 by RT PCR NEGATIVE NEGATIVE Final    Comment: (NOTE) SARS-CoV-2 target nucleic acids are NOT DETECTED.  The SARS-CoV-2 RNA is generally detectable in upper respiratory specimens during the acute phase of infection. The lowest concentration of SARS-CoV-2 viral copies this assay can detect is 138 copies/mL. A negative result does not  preclude SARS-Cov-2 infection and should not be used as the sole basis for treatment or other patient management decisions. A negative result may  occur with  improper specimen collection/handling, submission of specimen other than nasopharyngeal swab, presence of viral mutation(s) within the areas targeted by this assay, and inadequate number of viral copies(<138 copies/mL). A negative result must be combined with clinical observations, patient history, and epidemiological information. The expected result is Negative.  Fact Sheet for Patients:  bloggercourse.com  Fact Sheet for Healthcare Providers:  seriousbroker.it  This test is no t yet approved or cleared by the [place]  FDA and  has been authorized for detection and/or diagnosis of SARS-CoV-2 by FDA under an Emergency Use Authorization (EUA). This EUA will remain  in effect (meaning this test can be used) for the duration of the COVID-19 declaration under Section 564(b)(1) of the Act, 21 U.S.C.section 360bbb-3(b)(1), unless the authorization is terminated  or revoked sooner.       Influenza A by PCR NEGATIVE NEGATIVE Final   Influenza B by PCR NEGATIVE NEGATIVE Final    Comment: (NOTE) The Xpert Xpress SARS-CoV-2/FLU/RSV plus assay is intended as an aid in the diagnosis of influenza from Nasopharyngeal swab specimens and should not be used as a sole basis for treatment. Nasal washings and aspirates are unacceptable for Xpert Xpress SARS-CoV-2/FLU/RSV testing.  Fact Sheet for Patients: bloggercourse.com  Fact Sheet for Healthcare Providers: seriousbroker.it  This test is not yet approved or cleared by the [place]  FDA and has been authorized for detection and/or diagnosis of SARS-CoV-2 by FDA under an Emergency Use Authorization (EUA). This EUA will remain in effect (meaning this test can be used) for the  duration of the COVID-19 declaration under Section 564(b)(1) of the Act, 21 U.S.C. section 360bbb-3(b)(1), unless the authorization is terminated or revoked.     Resp Syncytial Virus by PCR NEGATIVE NEGATIVE Final    Comment: (NOTE) Fact Sheet for Patients: bloggercourse.com  Fact Sheet for Healthcare Providers: seriousbroker.it  This test is not yet approved or cleared by the [place]  FDA and has been authorized for detection and/or diagnosis of SARS-CoV-2 by FDA under an Emergency Use Authorization (EUA). This EUA will remain in effect (meaning this test can be used) for the duration of the COVID-19 declaration under Section 564(b)(1) of the Act, 21 U.S.C. section 360bbb-3(b)(1), unless the authorization is terminated or revoked.  Performed at [facility], [address], [place], [place] [address]      Labs:  CBC: Recent Labs  Lab 11/12/24 2027 11/13/24 1138  WBC 2.2* 1.6*  NEUTROABS 0.7*  --   HGB 6.2* 8.0*  HCT 19.4* 25.0*  MCV 91.5 91.2  PLT 96* 90*   BMP &GFR Recent Labs  Lab 11/12/24 2027 11/13/24 1138  NA 131* 137  K 4.6 3.7  CL 99 104  CO2 24 26  GLUCOSE 106* 124*  BUN 30* 25*  CREATININE 1.07 0.90  CALCIUM  8.7* 8.9  MG 2.0 2.0  PHOS 3.8  --    Estimated Creatinine Clearance: 70.6 mL/min (by C-G formula based on SCr of 0.9 mg/dL). Liver & Pancreas: Recent Labs  Lab 11/12/24 2027 11/13/24 1138  AST 17 16  ALT 9 9  ALKPHOS 55 54  BILITOT 0.3 0.7  PROT 6.5 6.2*  ALBUMIN 3.6 3.4*   No results for input(s): LIPASE, AMYLASE in the last 168 hours. No results for input(s): AMMONIA in the last 168 hours. Diabetic: No results for input(s): HGBA1C in the last 72 hours. Recent Labs  Lab 11/12/24 2346 11/13/24 0401 11/13/24 0803  GLUCAP 90 101* 105*   Cardiac Enzymes: Recent  Labs  Lab 11/12/24 2027  CKTOTAL 33*   No results for input(s): PROBNP in the  last 8760 hours. Coagulation Profile: No results for input(s): INR, PROTIME in the last 168 hours. Thyroid  Function Tests: No results for input(s): TSH, T4TOTAL, FREET4, T3FREE, THYROIDAB in the last 72 hours. Lipid Profile: No results for input(s): CHOL, HDL, LDLCALC, TRIG, CHOLHDL, LDLDIRECT in the last 72 hours. Anemia Panel: Recent Labs    11/12/24 2027 11/12/24 2347 11/13/24 0449  VITAMINB12  --   --  1,346*  FOLATE  --   --  18.2  FERRITIN  --  1,155*  --   TIBC  --  155*  --   IRON  --  86  --   RETICCTPCT 5.4*  --   --    Urine analysis:    Component Value Date/Time   COLORURINE YELLOW 11/12/2024 2334   APPEARANCEUR HAZY (A) 11/12/2024 2334   LABSPEC 1.006 11/12/2024 2334   PHURINE 7.0 11/12/2024 2334   GLUCOSEU NEGATIVE 11/12/2024 2334   HGBUR NEGATIVE 11/12/2024 2334   BILIRUBINUR NEGATIVE 11/12/2024 2334   KETONESUR NEGATIVE 11/12/2024 2334   PROTEINUR NEGATIVE 11/12/2024 2334   NITRITE NEGATIVE 11/12/2024 2334   LEUKOCYTESUR LARGE (A) 11/12/2024 2334   Sepsis Labs: Invalid input(s): PROCALCITONIN, LACTICIDVEN   SIGNED:  Adoria Kawamoto T Ananias Kolander, MD  [facility] 11/13/2024, 12:43 PM   "

## 2024-11-14 LAB — TYPE AND SCREEN
ABO/RH(D): A POS
Antibody Screen: NEGATIVE
Unit division: 0
Unit division: 0

## 2024-11-14 LAB — BPAM RBC
Blood Product Expiration Date: 202601182359
Blood Product Expiration Date: 202601182359
ISSUE DATE / TIME: 202512232354
ISSUE DATE / TIME: 202512240731
Unit Type and Rh: 6200
Unit Type and Rh: 6200

## 2024-11-15 LAB — URINE CULTURE: Culture: >=100000 — AB

## 2024-11-15 NOTE — Progress Notes (Signed)
 Patient name: Jorge Sherman  Date of birth: [birth year] Date of visit: 11/15/2024 Referring Provider: No ref. provider found   Primary Care Provider: PCP Needed PPI                                                Chief Complaint:  Chief Complaint  Patient presents with   Follow-up    Impression /Plan:    Assessment & Plan  1.  Nonrheumatic aortic valve sclerosis/stenosis: Known. - Recent echocardiogram done at [facility] on 10/08/2024 shows a LVEF of 65 to 70%.  Patient has mild left atrial enlargement.  He has trivial mitral regurgitation.  Aortic valve is calcified with a mean gradient of 52.  Reported a severe aortic stenosis. - Patient is likely a poor candidate for TAVR.  Will refer patient to interventional specialist.  Patient to follow-up with either Dr. Blondie or Dr. Rojelio for potential TAVR.  2.  Chronic CAD - Known.  Patient has a history of coronary artery disease with previous stents.  He was seen by [facility] cardiology in 2021 at that time he had a normal stress test and moderate aortic stenosis. - History of type II MI in October 2023  3.  Mixed hyperlipidemia Known.  Most recent LDL of 37.  Continue current management. - Patient to remain on atorvastatin  80 mg  4.  Atherosclerotic cardiovascular disease -Known.  History of CAD with stents. - Previous seen by [facility] cardiology; negative stress test in 2021. - Continue current treatment with atorvastatin   5.  Myelodysplasia syndrome Known.   continue treatment with oncology. - Patient with recent transfusion.  Most recent hemoglobin of 8.0 on 11/13/24  6.  Thrombocytopenia No.  Likely related to myelodysplasia syndrome. - Most recent platelet count of 90. - Managed by oncology/hematology.    History Present Illness:   Jorge Sherman is a 83 year old male who presents for hospital follow-up.  Patient was seen at [facility] on 11/12/2024 through 11/13/2024 for anemia.  Patient was found to be anemic  with a hemoglobin of 6.2.  He has a past medical history of mild dysplastic syndrome.  Patient is treated with chemotherapy.  He has a history of CAD, heart failure preserved ejection fraction, severe aortic stenosis, diabetes mellitus, and hypertensionpatient was transfused with 2 units of packed red blood cells.   He is currently without chest pain or shortness of breath.  He denies palpitations, nausea,  vomiting or syncope.  The patient currently resides in a skilled nursing facility.  He is under the care of Dr. Gatha with hematology oncology.  While hospitalized patient underwent echocardiogram.  Echocardiogram was performed at a previous hospitalization on 10/08/2024.  His echo revealed LVEF of 65 to 70%.  His left atrial size was mildly dilated.  He had trivial mitral regurgitation.  His aortic valve was calcified with restricted motion.  He had a peak gradient of 76 and a mean gradient of 52.  With a VTI of 0.81 cm.  This is consistent with aortic stenosis that is severe.    On September 24, 2024 the patient was seen previously in consult by Dr. Maralee.  At that time he was doing well.  He was on therapy for Dyslipidemia and hypertension.  His valvular disease was thought to be moderate to severe.  Dr. Maralee wanted a repeat echo  in approximately 6 months.  In discussion with the patient he is aware that TAVR may not be in his best interest.  Patient states that he believes that he has approximately 1 year to 18 months of life span.  He states that if he would get extended life with a TAVR he would be interested.  I advised patient that I would attempt to schedule him with one of our procedural physicians.  At that time it will be determined whether he is a candidate for TAVR.   Past Medical History:  Medical History[1]   Social History:   Social History   Socioeconomic History   Marital status: Divorced    Spouse name: Not on file   Number of children: Not on file   Years of  education: Not on file   Highest education level: Not on file  Occupational History   Not on file  Tobacco Use   Smoking status: Former    Current packs/day: 0.00    Types: Cigarettes    Quit date: 08/10/1988    Years since quitting: 36.2   Smokeless tobacco: Never  Substance and Sexual Activity   Alcohol  use: No   Drug use: No    Comment: Drug use: Denies   Sexual activity: Not on file    Comment: Seperated   Other Topics Concern   Not on file  Social History Narrative   ** Merged History Encounter **       Social Drivers of Health   Living Situation: Low Risk (11/15/2024)   Living Situation    What is your living situation today?: I have a steady place to live    Think about the place you live. Do you have problems with any of the following? Choose all that apply:: None/None on this list  Food Insecurity: Low Risk (11/15/2024)   Food vital sign    Within the past 12 months, you worried that your food would run out before you got money to buy more: Never true    Within the past 12 months, the food you bought just didn't last and you didn't have money to get more: Never true  Transportation Needs: No Transportation Needs (10/15/2024)   Transportation    In the past 12 months, has lack of reliable transportation kept you from medical appointments, meetings, work or from getting things needed for daily living? : No  Utilities: Low Risk (10/15/2024)   Utilities    In the past 12 months has the electric, gas, oil, or water  company threatened to shut off services in your home? : No  Safety: Low Risk (11/15/2024)   Safety    How often does anyone, including family and friends, physically hurt you?: Never    How often does anyone, including family and friends, insult or talk down to you?: Never    How often does anyone, including family and friends, threaten you with harm?: Never    How often does anyone, including family and friends, scream or curse at you?:  Never  Alcohol  Screening: Not At Risk (07/27/2024)   Alcohol     Audit C Alcohol  risk score: 0  Tobacco Use: Medium Risk (11/15/2024)   Patient History    Smoking Tobacco Use: Former    Smokeless Tobacco Use: Never    Passive Exposure: Not on file  Depression: Not At Risk (11/15/2024)   PHQ-2    PHQ-2 Score: 0  Social Connections: Socially Isolated (10/07/2024)   Received from [facility]  Social Connection and Isolation Panel    In a typical week, how many times do you talk on the phone with family, friends, or neighbors?: More than three times a week    How often do you get together with friends or relatives?: Three times a week    How often do you attend church or religious services?: Never    Do you belong to any clubs or organizations such as church groups, unions, fraternal or athletic groups, or school groups?: No    How often do you attend meetings of the clubs or organizations you belong to?: Never    Are you married, widowed, divorced, separated, never married, or living with a partner?: Divorced  Physicist, Medical Strain: Low Risk (11/01/2022)   Received from [facility]   Overall Financial Resource Strain (CARDIA)    Difficulty of Paying Living Expenses: Not hard at all    Family History :  Family History[2]   Current Medications: Current Medications[3]  Allergies:   Allergies[4]   Review Of  Systems:  Negative  History obtained from chart review and the patient General ROS: negative Hematological and Lymphatic ROS: positive anemia due to MDS Endocrine ROS: negative Respiratory ROS: no cough, shortness of breath, or wheezing Cardiovascular ROS: no chest pain or dyspnea on exertion Gastrointestinal ROS: no abdominal pain, change in bowel habits, or black or bloody stools Musculoskeletal ROS: negative  Vital Signs:  BP (!) 94/57 (BP Location: Left arm, Patient Position: Sitting)   Pulse 94   Ht 1.778 m (5' 10)   Wt 91.6 kg (202 lb)   SpO2  95%   BMI 28.98 kg/m  Wt Readings from Last 3 Encounters:  11/15/24 91.6 kg (202 lb)  10/22/24 90.3 kg (199 lb)  10/15/24 90.6 kg (199 lb 11.8 oz)    Physical Examination Constitutional: See Vital Signs                          No Acute Distress Normal Body Habitus and development; in wheel chair  Eyes:  Normal conjunctivae Ears Nose and Throat: Normal  Neck: No significant jugular venous distension.  Thyroid  does not appear enlarged visually Respiratory:   No increased work of breathing; CTBA; No wheezes rhonchi or rales  Cardiovascular Exam:   No palpable thrills or lifts, PMI non displaced.  S1 S2   regular rate and  rhythm  2/6 murmur;  No rubs or gallop.   Normal Carotid upstroke, No Carotid Bruits  No abnormal abdominal pulsation or bruits  Pedal/radial Pulses - present  No significant edema or varicosities   Abdomen  Soft non tender;  non distended, + bowel sounds, No obvious HSM or masses Ext:  No clubbing or cyanosis  MSK:  Generally normal strength;   Neuro:  Alert and Oriented x 2  Grossly normal non focal, Normal mood and affect Psych:  Normal Mood and affect Skin: No rash      Diagnostic Studies:  Lab Results  Component Value Date   WBC 1.80 (L) 10/24/2024   HGB 9.0 (L) 10/24/2024   HCT 27.0 (L) 10/24/2024   PLT 82 (L) 10/24/2024    Lab Results  Component Value Date   NA 137 10/16/2024   K 4.0 10/16/2024   CL 103 10/16/2024   CO2 30 10/16/2024   BUN 12 10/16/2024   CREATININE 0.79 10/16/2024   GLUCOSE 100 (H) 10/16/2024   CALCIUM  9.2 10/16/2024   MG 1.9 10/16/2024  PHOS 4.1 11/19/2021    Lab Results  Component Value Date   BILITOT 0.5 10/15/2024   PROT 6.0 (L) 10/15/2024   ALBUMIN 3.4 (L) 10/15/2024   ALT 14 10/15/2024   AST 14 10/15/2024   ALP 53 10/15/2024    Lab Results  Component Value Date   INR 1.2 10/15/2024   PTT 30.2 10/15/2024    Lab Results  Component Value Date   CHOL 88 09/24/2024   Lab Results  Component Value  Date   HDL 36 (L) 09/24/2024   Lab Results  Component Value Date   LDLCALC 37 09/24/2024   Lab Results  Component Value Date   TRIG 74 09/24/2024   Lab Results  Component Value Date   HDL 36 (L) 09/24/2024      Thank you for allowing me to participate in the care of your patient.  Please feel free to contact me ifyou need any further information.  Samandra Nadine Tann, NP        [1] Past Medical History: Diagnosis Date   Anemia    Arthritis    BPH (benign prostatic hyperplasia)    Coronary artery disease    Depression    Diabetes mellitus (CMD)    Diabetes mellitus type II, controlled (CMD)    patient states per Dr. Carlo Hora that he is not diabetic   Difficulty swallowing    GERD (gastroesophageal reflux disease)    High cholesterol    Hypertension    Kidney stone    history   MRSA (methicillin resistant staph aureus) culture positive 08/10/2018   nasal swab   OSA (obstructive sleep apnea)    does not use CPAP  [2] Family History Problem Relation Name Age of Onset   Cancer Father    [3] Current Outpatient Medications  Medication Sig Dispense Refill   acetaminophen  (TYLENOL ) 325 mg tablet Take by mouth every 6 (six) hours as needed.     albuterol  HFA (PROVENTIL  HFA;VENTOLIN  HFA;PROAIR  HFA) 90 mcg/actuation inhaler Inhale 2 puffs every 4 (four) hours as needed for wheezing.     alum-mag hydroxide-simethicone  (MAALOX MAX) 400-400-40 mg/5 mL suspension Take 30 mL by mouth every 4 (four) hours as needed for indigestion or heartburn.     amLODIPine  (NORVASC ) 10 mg tablet Take by mouth.     ascorbic acid  (VITAMIN C ) 500 mg tablet Take 500 mg by mouth 3 (three) times a day.     atorvastatin  (LIPITOR ) 80 mg tablet Take 1 tablet by mouth daily.     busPIRone  (BUSPAR ) 15 mg tablet Take 15 mg by mouth 3 (three) times a day.     cranberry fruit (cranberry) 450 mg tab tablet Take 450 mg by mouth daily.     cyanocobalamin , vitamin B-12,  5,000 mcg TbDL Dissolve 1 tablet on tongue daily.     cyclobenzaprine  (FLEXERIL ) 10 mg tablet Take 10 mg by mouth 3 (three) times a day.     dextran 70-hypromellose, PF, 0.1-0.3 % ophthalmic solution Administer 2 drops into both eyes 2 (two) times a day.     DULoxetine  (CYMBALTA ) 60 mg capsule Take 1 capsule (60 mg total) by mouth daily.     ferrous sulfate  325 mg (65 mg iron) tablet Take 325 mg by mouth 3 (three) times a day with meals.     fluticasone  propionate (FLONASE ) 50 mcg/spray nasal spray Administer 1 spray into each nostril daily.     folic acid  (FOLVITE ) 400 mcg tablet Take 400 mcg by mouth daily.  guaiFENesin  (ROBITUSSIN) 100 mg/5 mL syrup Take 10 mL by mouth every 4 (four) hours as needed for cough.     hydroxychloroquine  (PLAQUENIL ) 200 mg tablet Take 200 mg by mouth 2 (two) times a day.     linaCLOtide  (Linzess ) 145 mcg cap capsule Take 145 mcg by mouth daily.     loratadine  (CLARITIN ) 10 mg tablet Take 10 mg by mouth daily.     LORazepam  (ATIVAN ) 0.5 mg tablet Take 0.5 mg by mouth 2 (two) times a day.     morphine  (MSIR) 15 mg tablet Take 15 mg by mouth every 8 (eight) hours as needed for severe pain (7-10).     multivitamin with folic acid  400 mcg tab Take 1 tablet by mouth Once Daily.     ondansetron  (ZOFRAN ) 4 mg tablet Take 4 mg by mouth every 6 (six) hours as needed for nausea or vomiting.     pantoprazole  (PROTONIX ) 40 mg EC tablet Take 40 mg by mouth 2 (two) times a day.     polyethylene glycol (GLYCOLAX ) 17 gram packet Take 17 g by mouth daily.     pregabalin  (LYRICA ) 75 mg capsule Take 75 mg by mouth 2 (two) times a day.     sennosides-docusate sodium  (PERICOLACE) 8.6-50 mg per tablet Take 2 tablets by mouth 2 (two) times a day.     SUMAtriptan  (IMITREX ) 50 mg tablet Take 50 mg by mouth once as needed for migraine. May repeat dose once in 2 hours if no relief.  Do not exceed 2 doses in 24 hours.     tamsulosin  (FLOMAX ) 0.4 mg cap Take 0.4 mg by  mouth daily.     thiamine  (VITAMIN B1) 100 mg tablet Take 100 mg by mouth Once Daily.     No current facility-administered medications for this visit.  [4] Allergies Allergen Reactions   Cefepime Rash and Other (See Comments)   Gabapentin  Mental Status Changes    Daughter reports altered now, use to take and no longer tolerates?   Iodine Hives   Iohexol  Hives     Desc: A FEW HIVES POST CT, IODINE   Tositumomab    Trazodone  Mental Status Changes    Daughter reports- states mental status changes    Fentanyl  GI Intolerance and Itching    Patient states that it happened over 30 yrs ago   Iodine Rash

## 2024-11-16 ENCOUNTER — Telehealth (HOSPITAL_BASED_OUTPATIENT_CLINIC_OR_DEPARTMENT_OTHER): Payer: Self-pay | Admitting: *Deleted

## 2024-11-16 NOTE — Progress Notes (Signed)
 ED Antimicrobial Stewardship Positive Culture Follow Up   Jorge Sherman is an 83 y.o. male who presented to [facility] on 11/12/2024 with a chief complaint of  Chief Complaint  Patient presents with   Low hemoglobin    Recent Results (from the past 720 hours)  Urine Culture (for pregnant, neutropenic or urologic patients or patients with an indwelling urinary catheter)     Status: Abnormal   Collection Time: 11/12/24 11:34 PM   Specimen: Urine, Clean Catch  Result Value Ref Range Status   Specimen Description   Final    URINE, CLEAN CATCH Performed at [facility], [address], [place], [place] [address]    Special Requests   Final    NONE Performed at [facility], [address] [place], [place] [address]    Culture >=100,000 COLONIES/mL PROTEUS MIRABILIS (A)  Final   Report Status 11/15/2024 FINAL  Final   Organism ID, Bacteria PROTEUS MIRABILIS (A)  Final      Susceptibility   Proteus mirabilis - MIC*    AMPICILLIN  <=2 SENSITIVE Sensitive     CEFAZOLIN (URINE) Value in next row Sensitive      4 SENSITIVEThis is a modified FDA-approved test that has been validated and its performance characteristics determined by the reporting laboratory.  This laboratory is certified under the Clinical Laboratory Improvement Amendments CLIA as qualified to perform high complexity clinical laboratory testing.    CEFEPIME Value in next row Sensitive      4 SENSITIVEThis is a modified FDA-approved test that has been validated and its performance characteristics determined by the reporting laboratory.  This laboratory is certified under the Clinical Laboratory Improvement Amendments CLIA as qualified to perform high complexity clinical laboratory testing.    ERTAPENEM Value in next row Sensitive      4 SENSITIVEThis is a modified FDA-approved test that has been validated and its performance characteristics determined by the reporting laboratory.  This  laboratory is certified under the Clinical Laboratory Improvement Amendments CLIA as qualified to perform high complexity clinical laboratory testing.    CEFTRIAXONE  Value in next row Sensitive      4 SENSITIVEThis is a modified FDA-approved test that has been validated and its performance characteristics determined by the reporting laboratory.  This laboratory is certified under the Clinical Laboratory Improvement Amendments CLIA as qualified to perform high complexity clinical laboratory testing.    CIPROFLOXACIN  Value in next row Resistant      4 SENSITIVEThis is a modified FDA-approved test that has been validated and its performance characteristics determined by the reporting laboratory.  This laboratory is certified under the Clinical Laboratory Improvement Amendments CLIA as qualified to perform high complexity clinical laboratory testing.    GENTAMICIN Value in next row Sensitive      4 SENSITIVEThis is a modified FDA-approved test that has been validated and its performance characteristics determined by the reporting laboratory.  This laboratory is certified under the Clinical Laboratory Improvement Amendments CLIA as qualified to perform high complexity clinical laboratory testing.    NITROFURANTOIN Value in next row Resistant      4 SENSITIVEThis is a modified FDA-approved test that has been validated and its performance characteristics determined by the reporting laboratory.  This laboratory is certified under the Clinical Laboratory Improvement Amendments CLIA as qualified to perform high complexity clinical laboratory testing.    TRIMETH/SULFA Value in next row Resistant      4 SENSITIVEThis is a modified FDA-approved test that  has been validated and its performance characteristics determined by the reporting laboratory.  This laboratory is certified under the Clinical Laboratory Improvement Amendments CLIA as qualified to perform high complexity clinical laboratory testing.     AMPICILLIN /SULBACTAM Value in next row Sensitive      4 SENSITIVEThis is a modified FDA-approved test that has been validated and its performance characteristics determined by the reporting laboratory.  This laboratory is certified under the Clinical Laboratory Improvement Amendments CLIA as qualified to perform high complexity clinical laboratory testing.    PIP/TAZO Value in next row Sensitive      <=4 SENSITIVEThis is a modified FDA-approved test that has been validated and its performance characteristics determined by the reporting laboratory.  This laboratory is certified under the Clinical Laboratory Improvement Amendments CLIA as qualified to perform high complexity clinical laboratory testing.    MEROPENEM Value in next row Sensitive      <=4 SENSITIVEThis is a modified FDA-approved test that has been validated and its performance characteristics determined by the reporting laboratory.  This laboratory is certified under the Clinical Laboratory Improvement Amendments CLIA as qualified to perform high complexity clinical laboratory testing.    * >=100,000 COLONIES/mL PROTEUS MIRABILIS  Resp panel by RT-PCR (RSV, Flu A&B, Covid) Anterior Nasal Swab     Status: None   Collection Time: 11/13/24  1:22 AM   Specimen: Anterior Nasal Swab  Result Value Ref Range Status   SARS Coronavirus 2 by RT PCR NEGATIVE NEGATIVE Final    Comment: (NOTE) SARS-CoV-2 target nucleic acids are NOT DETECTED.  The SARS-CoV-2 RNA is generally detectable in upper respiratory specimens during the acute phase of infection. The lowest concentration of SARS-CoV-2 viral copies this assay can detect is 138 copies/mL. A negative result does not preclude SARS-Cov-2 infection and should not be used as the sole basis for treatment or other patient management decisions. A negative result may occur with  improper specimen collection/handling, submission of specimen other than nasopharyngeal swab, presence of viral mutation(s)  within the areas targeted by this assay, and inadequate number of viral copies(<138 copies/mL). A negative result must be combined with clinical observations, patient history, and epidemiological information. The expected result is Negative.  Fact Sheet for Patients:  bloggercourse.com  Fact Sheet for Healthcare Providers:  seriousbroker.it  This test is no t yet approved or cleared by the [place]  FDA and  has been authorized for detection and/or diagnosis of SARS-CoV-2 by FDA under an Emergency Use Authorization (EUA). This EUA will remain  in effect (meaning this test can be used) for the duration of the COVID-19 declaration under Section 564(b)(1) of the Act, 21 U.S.C.section 360bbb-3(b)(1), unless the authorization is terminated  or revoked sooner.       Influenza A by PCR NEGATIVE NEGATIVE Final   Influenza B by PCR NEGATIVE NEGATIVE Final    Comment: (NOTE) The Xpert Xpress SARS-CoV-2/FLU/RSV plus assay is intended as an aid in the diagnosis of influenza from Nasopharyngeal swab specimens and should not be used as a sole basis for treatment. Nasal washings and aspirates are unacceptable for Xpert Xpress SARS-CoV-2/FLU/RSV testing.  Fact Sheet for Patients: bloggercourse.com  Fact Sheet for Healthcare Providers: seriousbroker.it  This test is not yet approved or cleared by the [place]  FDA and has been authorized for detection and/or diagnosis of SARS-CoV-2 by FDA under an Emergency Use Authorization (EUA). This EUA will remain in effect (meaning this test can be used) for the duration of the COVID-19 declaration under Section  564(b)(1) of the Act, 21 U.S.C. section 360bbb-3(b)(1), unless the authorization is terminated or revoked.     Resp Syncytial Virus by PCR NEGATIVE NEGATIVE Final    Comment: (NOTE) Fact Sheet for  Patients: bloggercourse.com  Fact Sheet for Healthcare Providers: seriousbroker.it  This test is not yet approved or cleared by the [place]  FDA and has been authorized for detection and/or diagnosis of SARS-CoV-2 by FDA under an Emergency Use Authorization (EUA). This EUA will remain in effect (meaning this test can be used) for the duration of the COVID-19 declaration under Section 564(b)(1) of the Act, 21 U.S.C. section 360bbb-3(b)(1), unless the authorization is terminated or revoked.  Performed at [facility], [address] [place], [place] [address]     Patient also has a history of recurrent UTI, recently prescribed levofloxacin  outpatient. Ucx from 12/23 growing proteus mirabilis, resistant to Cipro , sensitive to cefazolin. Given patient has a cefepime allergy but has tolerated amoxicillin  (per allergy chart note), will proceed with Augmentin  875mg /125mg  BID x5 days.  [x]  Treated with levofloxacin , organism resistant to prescribed antimicrobial []  Patient discharged originally without antimicrobial agent and treatment is now indicated  New antibiotic prescription: Augmentin  875mg /125mg  PO BID x5 days  ED Provider: Dr. Rogelia Lacinda Moats, PharmD Clinical Pharmacist  12/27/20259:42 AM Clinical Pharmacist Monday - Friday phone -  [phone] Saturday - Sunday phone - [phone]

## 2024-11-16 NOTE — Telephone Encounter (Signed)
 Post ED Visit - Positive Culture Follow-up: Successful Patient Follow-Up  Culture assessed and recommendations reviewed by:  []  Rankin Dee, Pharm.D. []  Venetia Gully, Pharm.D., BCPS AQ-ID []  Garrel Crews, Pharm.D., BCPS []  Almarie Lunger, Pharm.D., BCPS []  [place], [address].D., BCPS, AAHIVP []  Rosaline Bihari, Pharm.D., BCPS, AAHIVP []  Vernell Meier, PharmD, BCPS []  Latanya Hint, PharmD, BCPS []  Donald Medley, PharmD, BCPS [x]  Lacinda Moats, PharmD  Positive urine culture  []  Patient discharged without antimicrobial prescription and treatment is now indicated [x]  Organism is resistant to prescribed ED discharge antimicrobial []  Patient with positive blood cultures  Changes discussed with ED provider: Jerilynn Later New antibiotic prescription  Augmentin  875mg /125mg  PO BID x5 days   Contacted [facility] and faxed report date 11/16/24, time 1010   Albino Alan Novak 11/16/2024, 10:10 AM

## 2024-11-18 ENCOUNTER — Telehealth: Payer: Self-pay

## 2024-11-18 NOTE — Telephone Encounter (Signed)
 Pts daughter called in reference to pts upcoming appt.  She would like to be in the conversation during the office visit.   She also wanted to make us  aware that pt was supposed to be on A/B and someone stopped it prematurely.  Offered to call daughter during the pts next office visit with Dr [name].

## 2024-11-19 ENCOUNTER — Other Ambulatory Visit: Payer: Self-pay

## 2024-11-25 ENCOUNTER — Inpatient Hospital Stay: Attending: Internal Medicine

## 2024-11-25 ENCOUNTER — Inpatient Hospital Stay

## 2024-11-25 ENCOUNTER — Inpatient Hospital Stay (HOSPITAL_BASED_OUTPATIENT_CLINIC_OR_DEPARTMENT_OTHER): Admitting: Internal Medicine

## 2024-11-25 VITALS — BP 117/60 | HR 77 | Temp 98.7°F | Resp 17 | Ht 70.0 in | Wt 202.0 lb

## 2024-11-25 DIAGNOSIS — D469 Myelodysplastic syndrome, unspecified: Secondary | ICD-10-CM

## 2024-11-25 DIAGNOSIS — D630 Anemia in neoplastic disease: Secondary | ICD-10-CM | POA: Insufficient documentation

## 2024-11-25 DIAGNOSIS — Z881 Allergy status to other antibiotic agents status: Secondary | ICD-10-CM | POA: Diagnosis not present

## 2024-11-25 DIAGNOSIS — M19011 Primary osteoarthritis, right shoulder: Secondary | ICD-10-CM | POA: Diagnosis not present

## 2024-11-25 DIAGNOSIS — M19012 Primary osteoarthritis, left shoulder: Secondary | ICD-10-CM | POA: Diagnosis not present

## 2024-11-25 DIAGNOSIS — Z885 Allergy status to narcotic agent status: Secondary | ICD-10-CM | POA: Insufficient documentation

## 2024-11-25 DIAGNOSIS — Z79899 Other long term (current) drug therapy: Secondary | ICD-10-CM | POA: Diagnosis not present

## 2024-11-25 DIAGNOSIS — J9811 Atelectasis: Secondary | ICD-10-CM | POA: Insufficient documentation

## 2024-11-25 DIAGNOSIS — Z888 Allergy status to other drugs, medicaments and biological substances status: Secondary | ICD-10-CM | POA: Diagnosis not present

## 2024-11-25 DIAGNOSIS — Z79891 Long term (current) use of opiate analgesic: Secondary | ICD-10-CM | POA: Insufficient documentation

## 2024-11-25 DIAGNOSIS — D709 Neutropenia, unspecified: Secondary | ICD-10-CM | POA: Diagnosis not present

## 2024-11-25 LAB — BASIC METABOLIC PANEL - CANCER CENTER ONLY
Anion gap: 8 (ref 5–15)
BUN: 18 mg/dL (ref 8–23)
CO2: 27 mmol/L (ref 22–32)
Calcium: 9.1 mg/dL (ref 8.9–10.3)
Chloride: 100 mmol/L (ref 98–111)
Creatinine: 1.03 mg/dL (ref 0.61–1.24)
GFR, Estimated: >60 mL/min
Glucose, Bld: 109 mg/dL — ABNORMAL HIGH (ref 70–99)
Potassium: 4.8 mmol/L (ref 3.5–5.1)
Sodium: 136 mmol/L (ref 135–145)

## 2024-11-25 LAB — CBC WITH DIFFERENTIAL (CANCER CENTER ONLY)
Abs Immature Granulocytes: 0.02 K/uL (ref 0.00–0.07)
Basophils Absolute: 0 K/uL (ref 0.0–0.1)
Basophils Relative: 0 %
Eosinophils Absolute: 0 K/uL (ref 0.0–0.5)
Eosinophils Relative: 0 %
HCT: 28.3 % — ABNORMAL LOW (ref 39.0–52.0)
Hemoglobin: 8.9 g/dL — ABNORMAL LOW (ref 13.0–17.0)
Immature Granulocytes: 1 %
Lymphocytes Relative: 46 %
Lymphs Abs: 0.8 K/uL (ref 0.7–4.0)
MCH: 29 pg (ref 26.0–34.0)
MCHC: 31.4 g/dL (ref 30.0–36.0)
MCV: 92.2 fL (ref 80.0–100.0)
Monocytes Absolute: 0.1 K/uL (ref 0.1–1.0)
Monocytes Relative: 5 %
Neutro Abs: 0.9 K/uL — ABNORMAL LOW (ref 1.7–7.7)
Neutrophils Relative %: 48 %
Platelet Count: 107 K/uL — ABNORMAL LOW (ref 150–400)
RBC: 3.07 MIL/uL — ABNORMAL LOW (ref 4.22–5.81)
RDW: 15.6 % — ABNORMAL HIGH (ref 11.5–15.5)
WBC Count: 1.8 K/uL — ABNORMAL LOW (ref 4.0–10.5)
nRBC: 0 % (ref 0.0–0.2)

## 2024-11-25 LAB — SAMPLE TO BLOOD BANK

## 2024-11-25 MED ORDER — DARBEPOETIN ALFA 200 MCG/0.4ML IJ SOSY
200.0000 ug | PREFILLED_SYRINGE | Freq: Once | INTRAMUSCULAR | Status: AC
  Administered 2024-11-25: 200 ug via SUBCUTANEOUS
  Filled 2024-11-25: qty 0.4

## 2024-11-25 MED ORDER — AZACITIDINE CHEMO SQ INJECTION
75.0000 mg/m2 | Freq: Once | INTRAMUSCULAR | Status: AC
  Administered 2024-11-25: 162.5 mg via SUBCUTANEOUS
  Filled 2024-11-25: qty 6.5

## 2024-11-25 MED ORDER — ONDANSETRON HCL 8 MG PO TABS
8.0000 mg | ORAL_TABLET | Freq: Once | ORAL | Status: AC
  Administered 2024-11-25: 8 mg via ORAL
  Filled 2024-11-25: qty 1

## 2024-11-25 NOTE — Patient Instructions (Signed)
 [facility] - A DEPT OF [place]  Discharge Instructions: Thank you for choosing [facility] to provide your oncology and hematology care.   If you have a lab appointment with the Cancer Center, please go directly to the Cancer Center and check in at the registration area.   Wear comfortable clothing and clothing appropriate for easy access to any Portacath or PICC line.   We strive to give you quality time with your provider. You may need to reschedule your appointment if you arrive late (15 or more minutes).  Arriving late affects you and other patients whose appointments are after yours.  Also, if you miss three or more appointments without notifying the office, you may be dismissed from the clinic at the provider's discretion.      For prescription refill requests, have your pharmacy contact our office and allow 72 hours for refills to be completed.    Today you received the following chemotherapy and/or immunotherapy agents vidaza       To help prevent nausea and vomiting after your treatment, we encourage you to take your nausea medication as directed.  BELOW ARE SYMPTOMS THAT SHOULD BE REPORTED IMMEDIATELY: *FEVER GREATER THAN 100.4 F (38 C) OR HIGHER *CHILLS OR SWEATING *NAUSEA AND VOMITING THAT IS NOT CONTROLLED WITH YOUR NAUSEA MEDICATION *UNUSUAL SHORTNESS OF BREATH *UNUSUAL BRUISING OR BLEEDING *URINARY PROBLEMS (pain or burning when urinating, or frequent urination) *BOWEL PROBLEMS (unusual diarrhea, constipation, pain near the anus) TENDERNESS IN MOUTH AND THROAT WITH OR WITHOUT PRESENCE OF ULCERS (sore throat, sores in mouth, or a toothache) UNUSUAL RASH, SWELLING OR PAIN  UNUSUAL VAGINAL DISCHARGE OR ITCHING   Items with * indicate a potential emergency and should be followed up as soon as possible or go to the Emergency Department if any problems should occur.  Please show the CHEMOTHERAPY ALERT CARD or IMMUNOTHERAPY  ALERT CARD at check-in to the Emergency Department and triage nurse.  Should you have questions after your visit or need to cancel or reschedule your appointment, please contact [name]  Dept: [phone]  and follow the prompts.  Office hours are 8:00 a.m. to 4:30 p.m. Monday - Friday. Please note that voicemails left after 4:00 p.m. may not be returned until the following business day.  We are closed weekends and major holidays. You have access to a nurse at all times for urgent questions. Please call the main number to the clinic Dept: [phone] and follow the prompts.   For any non-urgent questions, you may also contact your provider using MyChart. We now offer e-Visits for anyone 14 and older to request care online for non-urgent symptoms. For details visit [email or web address].   Also download the MyChart app! Go to the app store, search MyChart, open the app, select [place], and log in with your MyChart username and password.

## 2024-11-25 NOTE — Progress Notes (Signed)
 No auth needed for Aranesp  per shara team.  Glorianne Proctor, PharmD, MBA

## 2024-11-25 NOTE — Progress Notes (Signed)
 "     [facility] [phone]   Fax:([phone]) [phone]  OFFICE PROGRESS NOTE  [name] LABOR, MD [address] [place] [address]  DIAGNOSIS: Myelodysplastic syndrome with excess blast (MDS-ES-I diagnosed in September 2025. IPSS-M: High risk (5.79)  PRIOR THERAPY: None  CURRENT THERAPY:  1) Azacitidine  75 mg/M2 subcutaneously on days 1-5 as well as 8 and 9 every 4 weeks.  Status post 3 cycles.  Starting from cycle #3 of his treatment will be only for days 1-5 every 4 weeks. 2) Aranesp  200 mcg subcutaneously every 2 weeks.  First dose November 26, 2023  INTERVAL HISTORY: Jorge Sherman 84 y.o. male returns to the clinic today for follow-up visit.Discussed the use of AI scribe software for clinical note transcription with the patient, who gave verbal consent to proceed.  History of Present Illness Jorge Sherman is an 84 year old male with high-risk myelodysplastic syndrome with excess blasts who presents for evaluation prior to cycle four of azacitidine  therapy.  He was diagnosed with high-risk myelodysplastic syndrome with excess blasts in September 2025 and is currently receiving azacitidine  75 mg/m^2 subcutaneously on days 1-5 every four weeks. He has completed three cycles of therapy and presents today for evaluation prior to starting cycle four.  He continues to experience chronic cytopenias, including anemia and neutropenia. He received a blood transfusion at [facility] approximately one week ago and two units of blood at the oncology clinic about ten days ago for symptomatic anemia. He notes that his blood counts continue to decline.  He currently feels well and denies new symptoms, recent infections, or acute illnesses. He denies recent hospitalizations for infection. He is actively taking precautions to avoid exposure to illness, including environmental cleaning and avoiding sick contacts.    MEDICAL HISTORY: Past Medical History:   Diagnosis Date   Anemia    Chronic coronary artery disease    Chronic pain    Depressive disorder, not elsewhere classified    Dizziness and giddiness    Essential and other specified forms of tremor    Headache(784.0)    History of blood transfusion 11/05/2024   Leukocytosis, unspecified    Mixed hyperlipidemia    Myelodysplastic syndrome (HCC)    Nonrheumatic aortic (valve) stenosis    moderate to severe aortic stenosis   Obstructive sleep apnea (adult) (pediatric)    Pain in joint, lower leg    Septic joint of left knee joint (HCC) 04/22/2019   Spinal stenosis, lumbar region, without neurogenic claudication    Thoracic or lumbosacral neuritis or radiculitis, unspecified    Type II or unspecified type diabetes mellitus without mention of complication, not stated as uncontrolled     ALLERGIES:  is allergic to cefepime, iodinated contrast media, iohexol , tositumomab, fentanyl , and iodine.  MEDICATIONS:  Current Outpatient Medications  Medication Sig Dispense Refill   acetaminophen  (TYLENOL ) 500 MG tablet Take 2 tablets (1,000 mg total) by mouth every 6 (six) hours as needed for headache, mild pain or fever. (Patient taking differently: Take 1,000 mg by mouth every 8 (eight) hours as needed for headache, mild pain (pain score 1-3) or fever.) 30 tablet 0   albuterol  (VENTOLIN  HFA) 108 (90 Base) MCG/ACT inhaler Inhale 2 puffs into the lungs every 4 (four) hours as needed for wheezing or shortness of breath. 1 each 0   alum & mag hydroxide-simeth (MAALOX PLUS) 400-400-40 MG/5ML suspension Take 30 mLs by mouth every 4 (four) hours as needed for indigestion.  ascorbic acid  (VITAMIN C ) 500 MG tablet Take 500 mg by mouth daily.     atorvastatin  (LIPITOR ) 80 MG tablet Take 80 mg by mouth at bedtime.     benzonatate  (TESSALON ) 100 MG capsule Take 100 mg by mouth 3 (three) times daily as needed for cough.     busPIRone  (BUSPAR ) 15 MG tablet Take 15 mg by mouth 3 (three) times daily.      clotrimazole-betamethasone  (LOTRISONE) cream Apply topically.     Cranberry 450 MG TABS Take 450 mg by mouth in the morning.     cyanocobalamin  (VITAMIN B12) 1000 MCG tablet Take 5,000 mcg by mouth daily.     cyclobenzaprine  (FLEXERIL ) 10 MG tablet Take 10 mg by mouth 3 (three) times daily.     Docusate Sodium  (DSS) 100 MG CAPS Take 100 mg by mouth in the morning and at bedtime.     DULoxetine  (CYMBALTA ) 60 MG capsule Take 60 mg by mouth daily.     ferrous sulfate  325 (65 FE) MG tablet Take 325 mg by mouth daily.      fluticasone  (FLONASE ) 50 MCG/ACT nasal spray Place 1 spray into the nose daily.     folic acid  (FOLVITE ) 400 MCG tablet Take 400 mcg by mouth every morning.     glycerin  adult 2 g suppository Place 1 suppository rectally every 4 (four) hours as needed for constipation.     guaiFENesin  (ROBITUSSIN) 100 MG/5ML liquid Take 10 mLs by mouth every 4 (four) hours as needed for cough.     [Paused] hydroxychloroquine  (PLAQUENIL ) 200 MG tablet Take 200 mg by mouth 2 (two) times daily.     Hypromellose (NATURAL BALANCE TEARS OP) Place 2 drops into both eyes in the morning and at bedtime.     levofloxacin  (LEVAQUIN ) 750 MG tablet Take 750 mg by mouth every evening.     Lidocaine  3 % CREA Apply 1 application  topically every 4 (four) hours as needed.     lidocaine  4 % Place 1 patch onto the skin in the morning and at bedtime.     LINZESS  145 MCG CAPS capsule Take 145 mcg by mouth daily.     LINZESS  72 MCG capsule Take 72 mcg by mouth every morning.     loratadine  (CLARITIN ) 10 MG tablet Take 10 mg by mouth in the morning.     LORazepam  (ATIVAN ) 0.5 MG tablet Take 0.5 mg by mouth 2 (two) times daily.     magnesium  hydroxide (MILK OF MAGNESIA) 400 MG/5ML suspension Take 30 mLs by mouth daily as needed for mild constipation.     melatonin 3 MG TABS tablet Take 3 mg by mouth at bedtime.     Meth-Hyo-M Bl-Na Phos-Ph Sal (URIBEL) 118 MG CAPS Take 1 capsule by mouth every 6 (six) hours.     morphine   (MSIR) 15 MG tablet Take 15 mg by mouth in the morning, at noon, and at bedtime.     Multiple Vitamin (MULTIVITAMIN WITH MINERALS) TABS tablet Take 1 tablet by mouth daily.     ondansetron  (ZOFRAN ) 4 MG tablet Take 1 tablet (4 mg total) by mouth every 6 (six) hours as needed for nausea. 20 tablet 0   pantoprazole  (PROTONIX ) 40 MG tablet Take 1 tablet (40 mg total) by mouth daily. (Patient taking differently: Take 40 mg by mouth 2 (two) times daily.)     polyethylene glycol (MIRALAX  / GLYCOLAX ) 17 g packet Take 17 g by mouth daily as needed for mild constipation. (Patient taking  differently: Take 17 g by mouth every 12 (twelve) hours as needed for mild constipation.) 14 each 0   pregabalin  (LYRICA ) 75 MG capsule Take 75 mg by mouth 2 (two) times daily.     sennosides-docusate sodium  (SENOKOT-S) 8.6-50 MG tablet Take 2 tablets by mouth in the morning and at bedtime.     SUMAtriptan  (IMITREX ) 50 MG tablet Take 50 mg by mouth every 2 (two) hours as needed for migraine or headache.     tamsulosin  (FLOMAX ) 0.4 MG CAPS capsule Take 0.4 mg by mouth every morning.     thiamine  100 MG tablet Take 1 tablet (100 mg total) by mouth daily. 30 tablet 0   No current facility-administered medications for this visit.    SURGICAL HISTORY:  Past Surgical History:  Procedure Laterality Date   BACK SURGERY     back surgey     times 3   GIVENS CAPSULE STUDY N/A 08/09/2024   Procedure: IMAGING PROCEDURE, GI TRACT, INTRALUMINAL, VIA CAPSULE;  Surgeon: Rosalie Kitchens, MD;  Location: [facility] ENDOSCOPY;  Service: Gastroenterology;  Laterality: N/A;   IR BONE MARROW BIOPSY & ASPIRATION  08/14/2024   IR INJECT/THERA/INC NEEDLE/CATH/PLC EPI/LUMB/SAC W/IMG  11/26/2020    REVIEW OF SYSTEMS:  Constitutional: positive for fatigue Eyes: negative Ears, nose, mouth, throat, and face: negative Respiratory: negative Cardiovascular: negative Gastrointestinal: negative Genitourinary:negative Integument/breast:  negative Hematologic/lymphatic: positive for easy bruising Musculoskeletal:negative Neurological: negative Behavioral/Psych: negative Endocrine: negative Allergic/Immunologic: negative   PHYSICAL EXAMINATION: General appearance: alert, cooperative, fatigued, and no distress Head: Normocephalic, without obvious abnormality, atraumatic Neck: no adenopathy, no JVD, supple, symmetrical, trachea midline, and thyroid  not enlarged, symmetric, no tenderness/mass/nodules Lymph nodes: Cervical, supraclavicular, and axillary nodes normal. Resp: clear to auscultation bilaterally Back: symmetric, no curvature. ROM normal. No CVA tenderness. Cardio: regular rate and rhythm, S1, S2 normal, no murmur, click, rub or gallop GI: soft, non-tender; bowel sounds normal; no masses,  no organomegaly Extremities: extremities normal, atraumatic, no cyanosis or edema and ecchymosis in the upper extremities Neurologic: Alert and oriented X 3, normal strength and tone. Normal symmetric reflexes. Normal coordination and gait  ECOG PERFORMANCE STATUS: 2 - Symptomatic, <50% confined to bed  Blood pressure 117/60, pulse 77, temperature 98.7 F (37.1 C), temperature source Temporal, resp. rate 17, height 5' 10 (1.778 m), weight 202 lb (91.6 kg), SpO2 96%.  LABORATORY DATA: Lab Results  Component Value Date   WBC 1.6 (L) 11/13/2024   HGB 8.0 (L) 11/13/2024   HCT 25.0 (L) 11/13/2024   MCV 91.2 11/13/2024   PLT 90 (L) 11/13/2024      Chemistry      Component Value Date/Time   NA 137 11/13/2024 1138   K 3.7 11/13/2024 1138   CL 104 11/13/2024 1138   CO2 26 11/13/2024 1138   BUN 25 (H) 11/13/2024 1138   CREATININE 0.90 11/13/2024 1138   CREATININE 0.98 10/28/2024 0945      Component Value Date/Time   CALCIUM  8.9 11/13/2024 1138   ALKPHOS 54 11/13/2024 1138   AST 16 11/13/2024 1138   AST 13 (L) 08/22/2024 1101   ALT 9 11/13/2024 1138   ALT 9 08/22/2024 1101   BILITOT 0.7 11/13/2024 1138   BILITOT 0.5  08/22/2024 1101       RADIOGRAPHIC STUDIES: DG CHEST PORT 1 VIEW Result Date: 11/13/2024 CLINICAL DATA:  Dyspnea. EXAM: PORTABLE CHEST 1 VIEW COMPARISON:  10/06/2024 FINDINGS: Low lung volumes. Stable heart size and mediastinal contours. Mild atelectasis at the lung bases. No confluent  opacity. No pneumothorax or large pleural effusion. Chronic bilateral shoulder arthropathy. IMPRESSION: Low lung volumes with mild bibasilar atelectasis. Electronically Signed   By: Andrea Gasman M.D.   On: 11/13/2024 01:49    ASSESSMENT AND PLAN: This is a very pleasant 84 years old white male diagnosed with myelodysplastic syndrome with excess blasts and high risk IPSS-M in September 2025. He is currently undergoing treatment with azacitidine  subcutaneously on days 1-5 every 28 days status post 3 cycles. Assessment and Plan Assessment & Plan Myelodysplastic syndrome with excess blasts, high risk Chronic, high-risk myelodysplastic syndrome diagnosed in September 2025, currently managed with azacitidine . He has completed three cycles and is being evaluated prior to cycle four. Cytopenias persist but are managed with supportive care. Prognosis and disease course were discussed, emphasizing variability in survival and the necessity of ongoing therapy. - Continued azacitidine  75 mg/m^2 subcutaneously on days 1-5 every four weeks.  Anemia secondary to myelodysplastic syndrome Chronic anemia requiring intermittent transfusions, with recent improvement in hemoglobin to 8.9 g/dL. No transfusion required today. Erythropoiesis-stimulating agents were discussed as a strategy to reduce transfusion dependence, including mechanism and administration schedule. He agreed with this approach. - Reviewed hemoglobin (8.9 g/dL); no transfusion indicated today. - Discussed and administered erythropoiesis-stimulating agent (Aranesp  or Retacrit) to enhance erythropoiesis and reduce transfusion need.  Neutropenia secondary to  myelodysplastic syndrome Persistent neutropenia with white blood cell count of 1.8 and absolute neutrophil count of 900, acceptable for ongoing treatment. He remains at increased risk for infection and has frequent hospitalizations for infection. He is actively taking precautions to avoid exposure. - Reviewed white blood cell count (1.8) and absolute neutrophil count (900); values acceptable for ongoing therapy. - Provided anticipatory guidance regarding infection risk and reinforced avoidance of sick contacts. He was advised to call immediately if he has any concerning symptoms in the interval.  The patient voices understanding of current disease status and treatment options and is in agreement with the current care plan.  All questions were answered. The patient knows to call the clinic with any problems, questions or concerns. We can certainly see the patient much sooner if necessary. The total time spent in the appointment was 30 minutes including review of chart and various tests results, discussions about plan of care and coordination of care plan .   Disclaimer: This note was dictated with voice recognition software. Similar sounding words can inadvertently be transcribed and may not be corrected upon review.        "

## 2024-11-26 ENCOUNTER — Other Ambulatory Visit: Payer: Self-pay

## 2024-11-26 ENCOUNTER — Inpatient Hospital Stay

## 2024-11-26 VITALS — BP 102/53 | HR 89 | Temp 98.5°F | Resp 20

## 2024-11-26 DIAGNOSIS — D469 Myelodysplastic syndrome, unspecified: Secondary | ICD-10-CM

## 2024-11-26 MED ORDER — ONDANSETRON HCL 8 MG PO TABS
8.0000 mg | ORAL_TABLET | Freq: Once | ORAL | Status: AC
  Administered 2024-11-26: 8 mg via ORAL
  Filled 2024-11-26: qty 1

## 2024-11-26 MED ORDER — AZACITIDINE CHEMO SQ INJECTION
75.0000 mg/m2 | Freq: Once | INTRAMUSCULAR | Status: AC
  Administered 2024-11-26: 162.5 mg via SUBCUTANEOUS
  Filled 2024-11-26: qty 6.5

## 2024-11-26 MED ORDER — SODIUM CHLORIDE 0.9 % IV SOLN: Freq: Once | INTRAVENOUS | Status: AC

## 2024-11-26 NOTE — Patient Instructions (Signed)
 [facility] - A DEPT OF [facility]  Discharge Instructions: Thank you for choosing [facility] to provide your oncology and hematology care.   If you have a lab appointment with the Cancer Center, please go directly to the Cancer Center and check in at the registration area.   Wear comfortable clothing and clothing appropriate for easy access to any Portacath or PICC line.   We strive to give you quality time with your provider. You may need to reschedule your appointment if you arrive late (15 or more minutes).  Arriving late affects you and other patients whose appointments are after yours.  Also, if you miss three or more appointments without notifying the office, you may be dismissed from the clinic at the provider's discretion.      For prescription refill requests, have your pharmacy contact our office and allow 72 hours for refills to be completed.    Today you received the following chemotherapy and/or immunotherapy agents: Vidaza      To help prevent nausea and vomiting after your treatment, we encourage you to take your nausea medication as directed.  BELOW ARE SYMPTOMS THAT SHOULD BE REPORTED IMMEDIATELY: *FEVER GREATER THAN 100.4 F (38 C) OR HIGHER *CHILLS OR SWEATING *NAUSEA AND VOMITING THAT IS NOT CONTROLLED WITH YOUR NAUSEA MEDICATION *UNUSUAL SHORTNESS OF BREATH *UNUSUAL BRUISING OR BLEEDING *URINARY PROBLEMS (pain or burning when urinating, or frequent urination) *BOWEL PROBLEMS (unusual diarrhea, constipation, pain near the anus) TENDERNESS IN MOUTH AND THROAT WITH OR WITHOUT PRESENCE OF ULCERS (sore throat, sores in mouth, or a toothache) UNUSUAL RASH, SWELLING OR PAIN  UNUSUAL VAGINAL DISCHARGE OR ITCHING   Items with * indicate a potential emergency and should be followed up as soon as possible or go to the Emergency Department if any problems should occur.  Please show the CHEMOTHERAPY ALERT CARD or IMMUNOTHERAPY  ALERT CARD at check-in to the Emergency Department and triage nurse.  Should you have questions after your visit or need to cancel or reschedule your appointment, please contact [facility] - A DEPT OF [facility]  Dept: [phone]  and follow the prompts.  Office hours are 8:00 a.m. to 4:30 p.m. Monday - Friday. Please note that voicemails left after 4:00 p.m. may not be returned until the following business day.  We are closed weekends and major holidays. You have access to a nurse at all times for urgent questions. Please call the main number to the clinic Dept: [phone] and follow the prompts.   For any non-urgent questions, you may also contact your provider using MyChart. We now offer e-Visits for anyone 5 and older to request care online for non-urgent symptoms. For details visit [email or web address].   Also download the MyChart app! Go to the app store, search "MyChart", open the app, select [facility], and log in with your MyChart username and password.

## 2024-11-27 ENCOUNTER — Telehealth: Payer: Self-pay | Admitting: Medical Oncology

## 2024-11-27 ENCOUNTER — Inpatient Hospital Stay

## 2024-11-27 NOTE — H&P (Signed)
 "  [facility] Hospitalist History and Physical  Assessment/Plan:  Principal Problem:   Urinary retention Active Problems:   Chronic pain syndrome   Essential hypertension   OSA (obstructive sleep apnea)   Acute cystitis without hematuria   BPH (benign prostatic hyperplasia)   Gastroesophageal reflux disease without esophagitis   Type 2 diabetes mellitus, without long-term current use of insulin  (CMD)   Mixed hyperlipidemia Resolved Problems:   * No resolved hospital problems. *   Scotty Weigelt is a 84 y.o. male with PMHx BPH, recurrent GI bleeding, GERD without esophagitis, myelodysplastic syndrome as reviewed in the EMR that presented to [facility] with  Chief Complaint  Patient presents with   Penile Problem   Constipation   Is being admitted with Urinary retention    Assessment & Plan Urinary retention -800 mL of urine were removed and catheterization - Does have a history of BPH: -Urinary retention protocol added -Continue Flomax  Chronic pain syndrome Morphine  per nursing home schedule, Lyrica  OSA (obstructive sleep apnea) -CPAP at night Acute cystitis without hematuria -Ceftriaxone , continue, follow culture data Gastroesophageal reflux disease without esophagitis -PPI Mixed hyperlipidemia -Lipitor  Constipation Status post disimpaction and enema Continue MiraLAX , as needed glycerin  suppositories, Colace, Chronic constipation likely in the setting of pain medication usage Heme positive stool Patient did present with black stools without does take iron supplementation - Heme positive in setting of constipation - I doubt this is a true GI bleed but has had recurrent issues with GI bleeding in the past which would warrant at least following CBCs - Trend CBCs as already ordered, transfuse if hemoglobin less than 7 and at that time would consider GI consultation   CODE Status: dnr  Inpatient  DVT prophylaxis:    pneumatic compression device Anticipated  disposition: To Skilled Nursing Facility Estimated discharge:    2 days   ___________________________________________________________________  Chief Complaint: Chief Complaint  Patient presents with   Penile Problem   Constipation    HPI: Mohamud Mrozek is a 84 y.o. male with PMHx as reviewed in the EMR that presented to [facility] with  Chief Complaint  Patient presents with   Penile Problem   Constipation  .  84 year old gentleman from [name] presenting with pain in his rectum and penis.  Patient has chronic issues with his constipation now presenting with 1 day duration of rectal pain and penile pain with difficulty voiding of either.  Patient has been experiencing some small hard bowel movements and does take iron supplementation, stool was noted to be black in color.  He was noted to be retaining over 800 mL of urine in ED with In-N-Out catheterization Manual disimpaction was performed for constipation followed by an enema with positive result Stool was noted to be black and did test heme positive  After disimpaction patient is now without complaints  Allergies: Cefepime, Gabapentin , Iodine, Iohexol , Tositumomab, Trazodone , Fentanyl , and Iodine  Home Medications:  Home Medications     Med List Status: In progress Set By: Linnea JONETTA Essex, CPhT at 11/27/2024  6:12 AM          * acetaminophen  (TYLENOL ) 500 mg tablet   * albuterol  HFA (PROVENTIL  HFA;VENTOLIN  HFA;PROAIR  HFA) 90 mcg/actuation inhaler   * alum-mag hydroxide-simethicone  (MAALOX MAX) 400-400-40 mg/5 mL suspension   * ascorbic acid  (VITAMIN C ) 500 mg tablet   * atorvastatin  (LIPITOR ) 80 mg tablet   * busPIRone  (BUSPAR ) 15 mg tablet   * cranberry fruit (cranberry) 450 mg tab tablet   *  cyanocobalamin , vitamin B-12, 5,000 mcg TbDL   * cyclobenzaprine  (FLEXERIL ) 10 mg tablet   * dextran 70-hypromellose, PF, 0.1-0.3 % ophthalmic solution   * docusate sodium  (COLACE) 100 mg capsule    * DULoxetine  (CYMBALTA ) 60 mg capsule    Take 1 capsule (60 mg total) by mouth daily.    Patient taking differently: Take 60 mg by mouth daily.   * ferrous sulfate  325 mg (65 mg iron) tablet   * fluticasone  propionate (FLONASE ) 50 mcg/spray nasal spray   * folic acid  (FOLVITE ) 400 mcg tablet   * glycerin  (adult) suppository   * guaiFENesin  (ROBITUSSIN) 100 mg/5 mL syrup   * lidocaine  (SALONPAS) 4 % patch   * lidocaine  HCL 3 % glwp   * linaCLOtide  (Linzess ) 145 mcg cap capsule   * linaCLOtide  (LINZESS ) 72 mcg cap capsule   * loratadine  (CLARITIN ) 10 mg tablet   * LORazepam  (ATIVAN ) 0.5 mg tablet   * melatonin 3 mg tablet   * methen-m.blue-s.phos-phsal-hyo (UribeL) 118-10-40.8-36 mg cap   * morphine  (MSIR) 15 mg tablet   * multivitamin with folic acid  400 mcg tab    Take 1 tablet by mouth Once Daily.   * ondansetron  (ZOFRAN ) 4 mg tablet   * pantoprazole  (PROTONIX ) 40 mg EC tablet   * polyethylene glycol (GLYCOLAX ) 17 gram packet   * pregabalin  (LYRICA ) 75 mg capsule   * sennosides-docusate sodium  (PERICOLACE) 8.6-50 mg per tablet   * SUMAtriptan  (IMITREX ) 50 mg tablet   * tamsulosin  (FLOMAX ) 0.4 mg cap   * thiamine  (VITAMIN B1) 100 mg tablet    Take 100 mg by mouth Once Daily.    Ongoing Comment   Linnea JONETTA Essex, CPhT    06/25/2024  4:01 AM    [name]       Medical History: Medical History[1]  Surgical History: Surgical History[2]  Social History: Social History   Socioeconomic History   Marital status: Divorced    Spouse name: Not on file   Number of children: Not on file   Years of education: Not on file   Highest education level: Not on file  Occupational History   Not on file  Tobacco Use   Smoking status: Former    Current packs/day: 0.00    Types: Cigarettes    Quit date: 08/10/1988    Years since quitting: 36.3   Smokeless tobacco: Never  Substance and Sexual Activity   Alcohol  use: No   Drug use: No    Comment: Drug use: Denies    Sexual activity: Not on file    Comment: Seperated   Other Topics Concern   Not on file  Social History Narrative   ** Merged History Encounter **       Social Drivers of Health   Living Situation: Low Risk (11/20/2024)   Living Situation    What is your living situation today?: I have a steady place to live    Think about the place you live. Do you have problems with any of the following? Choose all that apply:: None/None on this list  Food Insecurity: Low Risk (11/20/2024)   Food vital sign    Within the past 12 months, you worried that your food would run out before you got money to buy more: Never true    Within the past 12 months, the food you bought just didn't last and you didn't have money to get more: Never true  Transportation Needs: No Transportation Needs (11/20/2024)  Transportation    In the past 12 months, has lack of reliable transportation kept you from medical appointments, meetings, work or from getting things needed for daily living? : No  Utilities: Low Risk (11/20/2024)   Utilities    In the past 12 months has the electric, gas, oil, or water  company threatened to shut off services in your home? : No  Safety: Low Risk (11/20/2024)   Safety    How often does anyone, including family and friends, physically hurt you?: Never    How often does anyone, including family and friends, insult or talk down to you?: Never    How often does anyone, including family and friends, threaten you with harm?: Never    How often does anyone, including family and friends, scream or curse at you?: Never  Alcohol  Screening: Not At Risk (11/27/2024)   Alcohol     Audit C Alcohol  risk score: 0  Tobacco Use: Medium Risk (11/27/2024)   Patient History    Smoking Tobacco Use: Former    Smokeless Tobacco Use: Never    Passive Exposure: Not on file  Depression: Not At Risk (11/15/2024)   PHQ-2    PHQ-2 Score: 0  Social Connections: Moderately Integrated (11/20/2024)    Social Connection and Isolation Panel    Frequency of Communication with Friends and Family: Three times a week    Frequency of Social Gatherings with Friends and Family: Three times a week    Attends Religious Services: More than 4 times per year    Active Member of Clubs or Organizations: Yes    Attends Banker Meetings: More than 4 times per year    Marital Status: Divorced  Recent Concern: Social Connections - Socially Isolated (10/07/2024)   Received from [facility]   Social Connection and Isolation Panel    In a typical week, how many times do you talk on the phone with family, friends, or neighbors?: More than three times a week    How often do you get together with friends or relatives?: Three times a week    How often do you attend church or religious services?: Never    Do you belong to any clubs or organizations such as church groups, unions, fraternal or athletic groups, or school groups?: No    How often do you attend meetings of the clubs or organizations you belong to?: Never    Are you married, widowed, divorced, separated, never married, or living with a partner?: Divorced  Physicist, Medical Strain: Low Risk (11/20/2024)   Overall Financial Resource Strain (CARDIA)    Difficulty of Paying Living Expenses: Not very hard    Family History: Family History[3]  Review of Systems: General: No fever, chills, weight changes Skin: No rashes, lesions, wounds Eyes: no discharge, redness, pain HENT: no ear pain, hearing loss, drainage, tinnitus Endocrine: no heat/cold intolerance, no polyuria Respiratory: No cough, wheezes, shortness of breath Cardiovascular: No palpitations, chest pain GI: No nausea, vomiting, diarrhea, positive constipation GU: No dysuria, increased frequency CNS: No numbness, dizziness, headache Musculoskeletal: No back pain, joint pain Blood/lymphatics: No easy bruising, bleeding Mood/affect: No anxiety/depression  Otherwise  full 14 point review of systems performed by me is negative unless mentioned in the HPI  Labs/Studies: Available labs and images in EMR personally reviewed   Physical Exam: Vitals:   11/27/24 0431  BP: 122/69  Pulse:   Resp:   Temp:   SpO2:    General: Elderly gentleman no acute distress, lying in  bed, numerous bruises in various stages of healing Eyes: extraocular muscles intact, pupils equal round react to light ENT: Moist mucosal membranes, no external lesions neck: No lymphadenopathy, no JVD Cardiovascular: Regular rate and rhythm, S1-S2 appreciated Pulmonary: Clear to auscultation bilaterally without wheezes, rhonchi, rales Abdomen: Soft, nontender, nondistended, positive bowel sounds Extremities: 1-2+ edema Neurological: Cranial nerves intact, no gross neurological deficits Psych: Awake alert oriented 3, appropriate GU: No CVA tenderness, otherwise deferred Musculoskeletal no spinal tenderness           [1] Past Medical History: Diagnosis Date   Anemia    Arthritis    BPH (benign prostatic hyperplasia)    Coronary artery disease    Depression    Diabetes mellitus (CMD)    Diabetes mellitus type II, controlled (CMD)    patient states per Dr. Carlo Hora that he is not diabetic   Difficulty swallowing    GERD (gastroesophageal reflux disease)    High cholesterol    Hypertension    Kidney stone    history   MRSA (methicillin resistant staph aureus) culture positive 08/10/2018   nasal swab   OSA (obstructive sleep apnea)    does not use CPAP  [2] Past Surgical History: Procedure Laterality Date   BACK SURGERY     Procedure: BACK SURGERY; 3x's, pt unsure of type    BACK SURGERY     Procedure: BACK SURGERY   CARDIAC CATHETERIZATION     Procedure: CARDIAC CATHETERIZATION   OTHER SURGICAL HISTORY     Procedure: OTHER SURGICAL HISTORY (pain stimulator ); Implanted 2009   POSTERIOR LAMINECTOMY / DECOMPRESSION LUMBAR SPINE N/A  08/14/2018   Procedure: L2-S1 Revision Laminectomy And Fusion, Removal of Hardware and Exploration of Fusion, Allograft;  Surgeon: Royden Elsie Schneider, MD;  Location: [facility] MAIN OR;  Service: Orthopedics;  Laterality: N/A;  Prone, Stim Neuromonitoring, O-Arm, [employer], Cell Saver, ProAxis, [employer]onic, Justin, PACS on GROUP 1 AUTOMOTIVE  [3] Family History Problem Relation Name Age of Onset   Cancer Father    *Some images could not be shown."

## 2024-11-27 NOTE — Progress Notes (Signed)
" °   11/27/24 2032  Noninvasive Ventilation  Noninvasive Activity Refused (advised he didnt wear one, and would just be here one night and did not wish to have it tonight)  Noninvasive Readings  SpO2 90 %    "

## 2024-11-27 NOTE — Progress Notes (Signed)
 "   Division of Pharmacy Services  Medication History Completion Note  Name/DOB/Age of Patient: Jorge Sherman / 23-Mar-1941 / 84 y.o.  Location: [facility]  Type: Hospital admission & Modality: Facility MAR  Confirmed two patient identifiers: Yes  Confirmed patient is alert & oriented: No  Medication History Source (Med History Informants):  Facility [facility] Name/Phone Number: Jorge Sherman.   Asked about any missing medications (such as pumps, injectable meds, TPN, OTC, etc): No  PTA Med List:  Prior to Admission Medications     Reviewed by Jorge Sherman, CPhT on 11/27/24 at 0738    Medication Sig Last Dose Informant Taking? Status  acetaminophen  (TYLENOL ) 500 mg tablet Take 1,000 mg by mouth every 8 (eight) hours as needed. 11/26/2024  6:02 PM Other Yes Active  albuterol  HFA (PROVENTIL  HFA;VENTOLIN  HFA;PROAIR  HFA) 90 mcg/actuation inhaler Inhale 2 puffs every 4 (four) hours as needed for wheezing. Unknown Other No Active  alum-mag hydroxide-simethicone  (MAALOX MAX) 400-400-40 mg/5 mL suspension Take 30 mL by mouth every 4 (four) hours as needed for indigestion or heartburn. 11/07/2024  1:07 PM Other No Active  ascorbic acid  (VITAMIN C ) 500 mg tablet Take 500 mg by mouth 3 (three) times a day. 11/26/2024  5:57 PM Other Yes Active  atorvastatin  (LIPITOR ) 80 mg tablet Take 1 tablet by mouth daily. 11/26/2024  8:05 PM Other Yes Active  busPIRone  (BUSPAR ) 15 mg tablet Take 15 mg by mouth 3 (three) times a day. 11/26/2024  9:14 PM Other Yes Active  cranberry fruit (cranberry) 450 mg tab tablet Take 450 mg by mouth daily. 11/26/2024  8:11 AM Other Yes Active  cyanocobalamin , vitamin B-12, 5,000 mcg TbDL Dissolve 1 tablet on tongue daily. 11/26/2024  8:11 AM Other Yes Active  cyclobenzaprine  (FLEXERIL ) 10 mg tablet Take 10 mg by mouth 3 (three) times a day. 11/26/2024  8:05 PM Other Yes Active  dextran 70-hypromellose, PF, 0.1-0.3 % ophthalmic solution Administer 2 drops into both eyes 2 (two) times a  day. 11/26/2024  8:05 PM Other Yes Active  docusate sodium  (COLACE) 100 mg capsule Take 100 mg by mouth 2 (two) times a day. 11/26/2024  5:57 PM Other Yes Active  DULoxetine  (CYMBALTA ) 60 mg capsule Take 1 capsule (60 mg total) by mouth daily.  Patient taking differently: Take 60 mg by mouth daily.   11/26/2024  8:11 AM Other Yes Active  ferrous sulfate  325 mg (65 mg iron) tablet Take 325 mg by mouth 3 (three) times a day with meals. 11/26/2024  5:57 PM Other Yes Active  fluticasone  propionate (FLONASE ) 50 mcg/spray nasal spray Administer 1 spray into each nostril daily. 11/26/2024  8:11 AM Other Yes Active  folic acid  (FOLVITE ) 400 mcg tablet Take 400 mcg by mouth daily. 11/26/2024  8:11 AM Other Yes Active  glycerin  (adult) suppository Insert 1 suppository into the rectum every 4 (four) hours as needed for constipation.  Patient not taking: Reported on 11/27/2024   Not Taking Other No Active  guaiFENesin  (ROBITUSSIN) 100 mg/5 mL syrup Take 10 mL by mouth every 4 (four) hours as needed for cough. 11/20/2024  6:17 PM Other No Active  lactulose (CHRONULAC) 10 gram/15 mL solution Take 30 g by mouth every 12 (twelve) hours as needed. 11/26/2024 12:32 PM Other Yes Active  lidocaine  (SALONPAS) 4 % patch Apply 1 patch topically daily. 11/26/2024  8:05 PM Other Yes Active  lidocaine  HCL 3 % glwp Apply 1 Application topically every 4 (four) hours as needed. 11/07/2024  7:47 PM Other  No Active  linaCLOtide  (Linzess ) 145 mcg cap capsule Take 145 mcg by mouth daily. Take with 72mcg to equal 217mcg daily. 11/26/2024  5:32 AM Other Yes Active  linaCLOtide  (LINZESS ) 72 mcg cap capsule Take 72 mcg by mouth daily before meal. Take with 145mcg to equal 217mcg daily. 11/26/2024  5:32 AM Other Yes Active  loratadine  (CLARITIN ) 10 mg tablet Take 10 mg by mouth daily. 11/26/2024  8:11 AM Other Yes Active  LORazepam  (ATIVAN ) 0.5 mg tablet Take 0.5 mg by mouth 2 (two) times a day. 11/26/2024  8:05 PM Other Yes Active  magnesium  hydroxide (MILK  OF MAGNESIA) 400 mg/5 mL suspension Take 30 mL by mouth daily as needed for constipation. 11/16/2024  6:10 AM Other No Active  melatonin 3 mg tablet Take 3 mg by mouth at bedtime. 11/26/2024  8:05 PM Other Yes Active  methen-m.blue-s.phos-phsal-hyo (UribeL) 118-10-40.8-36 mg cap Take 1 capsule by mouth every 6 (six) hours. 11/26/2024 11:00 PM Other Yes Active  morphine  (MSIR) 15 mg tablet Take 15 mg by mouth every 8 (eight) hours as needed for severe pain (7-10). 11/26/2024  9:14 PM Other Yes Active  multivitamin with folic acid  400 mcg tab Take 1 tablet by mouth Once Daily. 11/26/2024  8:11 AM Other Yes Active  ondansetron  (ZOFRAN ) 4 mg tablet Take 4 mg by mouth every 6 (six) hours as needed for nausea or vomiting. Unknown Other No Active  pantoprazole  (PROTONIX ) 40 mg EC tablet Take 40 mg by mouth 2 (two) times a day. 11/26/2024  8:05 PM Other Yes Active  polyethylene glycol (GLYCOLAX ) 17 gram packet Take 17 g by mouth every 12 (twelve) hours as needed for constipation. 11/17/2024  3:40 PM Other No Active  pregabalin  (LYRICA ) 75 mg capsule Take 75 mg by mouth 2 (two) times a day. 11/26/2024  8:05 PM Other Yes Active  sennosides-docusate sodium  (PERICOLACE) 8.6-50 mg per tablet Take 2 tablets by mouth 2 (two) times a day. 11/26/2024  8:05 PM Other Yes Active  SUMAtriptan  (IMITREX ) 50 mg tablet Take 50 mg by mouth once as needed for migraine. May repeat dose once in 2 hours if no relief.  Do not exceed 2 doses in 24 hours. Unknown Other No Active  tamsulosin  (FLOMAX ) 0.4 mg cap Take 0.4 mg by mouth daily. 11/26/2024  8:11 AM Other Yes Active  thiamine  (VITAMIN B1) 100 mg tablet Take 100 mg by mouth Once Daily. 11/26/2024  8:11 AM Other Yes Active  Med List Note Jorge Sherman, CPhT 06/25/24 0401): Jorge Sherman            Selected Pharmacy:  [facility] [place], [place] - [address] - PHONE: [phone] [place] [place]: [phone]  Comments: See PTA med list notes  Medications verified with [facility]  from Jorge Sherman.  Electronically signed by: Jorge Sherman, CPhT 11/27/2024 7:38 AM   Medications reconciled by provider: No   Signature/Co-signature, if required: Jorge Sherman, CPhT   Date/Time: 11/27/2024 7:38 AM  "

## 2024-11-27 NOTE — Telephone Encounter (Signed)
 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

## 2024-11-27 NOTE — ED Triage Notes (Signed)
 Pt BIB EMS from [name] and reports pain in his rectum and penis.

## 2024-11-28 ENCOUNTER — Inpatient Hospital Stay

## 2024-11-28 ENCOUNTER — Encounter: Payer: Self-pay | Admitting: Internal Medicine

## 2024-11-28 NOTE — Care Plan (Signed)
Problem: Knowledge Deficit  Goal: Patient/family/caregiver demonstrates understanding of disease process, treatment plan, medications, and discharge instructions  Description: Complete learning assessment and assess knowledge base.  Outcome: Progressing     Problem: Compromised Skin Integrity  Goal: Skin integrity is maintained or improved  Description: Assess and monitor skin integrity. Identify patients at risk for skin breakdown on admission and per policy. Collaborate with interdisciplinary team and initiate plans and interventions as needed.        Outcome: Progressing  Goal: Fluid and electrolyte balance are achieved/maintained  Description: Assess and monitor vital signs (orthostatic vitals if applicable), fluid intake and output, urine color, labs, skin turgor, mucous membranes, jugular venous distention, edema, circumference of edematous extremities and abdominal girth, respiratory status, and mental status.  Monitor for signs and symptoms of hypovolemia (tachycardia, rapid breathing, decreased urine output, postural hypotension, confusion, syncope).  Monitor for signs and symptoms of hypervolemia (strong rapid pulse, shortness of breath, difficulty breathing lying down, crackles heard in lung fields, edema). Collaborate with interdisciplinary team and initiate plan and interventions as ordered.  Outcome: Progressing  Goal: Nutritional status is improving  Description: Monitor and assess patient for malnutrition (ex- brittle hair, bruises, dry skin, pale skin and conjunctiva, muscle wasting, smooth red tongue, and disorientation). Collaborate with interdisciplinary team and initiate plan and interventions as ordered.  Monitor patient's weight and dietary intake as ordered or per policy. Utilize nutrition screening tool and intervene per policy. Determine patient's food preferences and provide high-protein, high-caloric foods as appropriate.   Outcome: Progressing     Problem: Urinary Incontinence  Goal:  Perineal skin integrity is maintained or improved  Description: Assess genitourinary system, perineal skin, labs (urinalysis), and history of incontinence to include past management, aggravating, and alleviating factors.  Collaborate with interdisciplinary team and initiate plans and interventions as needed.  Outcome: Progressing

## 2024-11-28 NOTE — Nursing Note (Signed)
 Pt reports constipation despite multiple bowel movements. Nurse offered pt miralax  and lactulose however pt refused. Nurse messaged hospitalist and hospitalist recommended Suppository. Pt agrees that suppository would be best for him

## 2024-11-28 NOTE — Nursing Note (Signed)
 Nurse notified provider that pt HBG was a 7.0. Nurse asked provider if there would need to be any blood transfusions and provider said no transfusions need to be done at this time.

## 2024-11-28 NOTE — Nursing Note (Signed)
 Pt became upset that nurse cannot digitally disimpact him. Nurse explained to pt that it's not within the policies of the hospital for the nurse to do so. Pt started cussing at the nurse and nurse told pt that staff will not tolerate being talked to that way. Pt apologized to nurse and said he is just frustrated because he feels the doctors are not treating his constipation aggressively enough. Nurse asked pt what it is that he feels we're not doing. Pt says he wants doctor to come up and personally disimpact him. Pt has refused miralax  offered to him because he says its like putting a bandaid on a fracture. Pt said that he is going to call the news on the hospital if the treatment continues

## 2024-11-29 ENCOUNTER — Inpatient Hospital Stay

## 2024-12-02 ENCOUNTER — Inpatient Hospital Stay

## 2024-12-02 ENCOUNTER — Ambulatory Visit (HOSPITAL_COMMUNITY): Admit: 2024-12-02 | Admitting: Urology

## 2024-12-02 HISTORY — DX: Myelodysplastic syndrome, unspecified: D46.9

## 2024-12-02 HISTORY — DX: Anemia, unspecified: D64.9

## 2024-12-02 HISTORY — DX: Mixed hyperlipidemia: E78.2

## 2024-12-02 HISTORY — DX: Nonrheumatic aortic (valve) stenosis: I35.0

## 2024-12-02 HISTORY — DX: Atherosclerotic heart disease of native coronary artery without angina pectoris: I25.10

## 2024-12-02 SURGERY — CIRCUMCISION, ADULT
Anesthesia: General

## 2024-12-03 ENCOUNTER — Inpatient Hospital Stay

## 2024-12-11 NOTE — Discharge Summary (Signed)
 "  Hospitalist Discharge Summary   Name: Jorge Sherman Age: 84 yrs  MRN: [id number] DOB: 1941/02/02  Admit Date: 12/10/2024 Admitting Physician: Marsa Rexann Speaks, MD  Discharge Date and Time: Wed 12/11/2024 Discharge Physician: Otelia Dolly, M.D    Admission Diagnoses:   Acute on chronic anemia [D64.9]   Discharge Diagnoses:   Principal Problem:   Acute on chronic anemia Active Problems:   Chronic pain syndrome   OSA (obstructive sleep apnea)   BPH (benign prostatic hyperplasia)   Gastroesophageal reflux disease without esophagitis   Type 2 diabetes mellitus, without long-term current use of insulin  (CMD)   MDS (myelodysplastic syndrome)    (CMD)   Pancytopenia (CMD)   GAVE (gastric antral vascular ectasia) Resolved Problems:   * No resolved hospital problems. *     *For documentation of patient's Past Medical History and Problem List, see the last page.     Indication for Hospitalization/Hospital Course:   For the details of admission, please see the H&P on 12/10/2024.  For full details, please see progress notes, consult notes and ancillary notes.  The patient's hospital course will be summarized in a problem based approach below.   Acute on chronic anemia Pancytopenia (CMD) MDS (myelodysplastic syndrome)    (CMD) -- On evaluation was found to have hemoglobin of 6.8 (slightly decreased from 7-7.5 from prior 2 weeks). Concomitant pancytopenia with WBC of 1.2, PLT 85.  Suspect in the setting of MDS.  Has reported dark/black stools in the setting of oral iron supplementation with endoscopic evaluation on 9/5 with follow-up VCE at [facility] on 9/19 with mild formation in the stomach and proximal duodenum.  Low suspicion for GIB Plan - Consented and transfused 1 unit of PRBC, follow posttransfusion hemoglobin in the morning was 7.9 and patient was feeling well. No evidence of acute bleeding.  Stable to DC back to SNF  Constipation  On Extensive regimen, says the  linzess  works best for him. This is not available here but can resume as outpatient   The patient's chronic medical conditions were treated accordingly per the patient's home medication regimen except as noted in the plan above and in the medication list below.    Discharge Condition:   Disposition: Patient discharged to Skilled Nursing Facility (CMS Certified) in stable condition.   Predictive Model Details        33.6% (High)  Factor Value   Calculated 12/11/2024 12:04 23% Number of hospitalizations in last year 5   Readmission Risk Score v2 Model 18% Number of ED visits in last 90 days 5    7% Latest hemoglobin in last 72 hrs 7.9 g/dL    6% Number of appointments in last 90 days 6    6% Number of active outpatient medication orders 35      Physical Exam at Discharge   BP 112/62 (BP Location: Left arm, Patient Position: Lying)   Pulse 78   Temp 97.6 F (36.4 C) (Oral)   Resp 18   Ht 1.778 m (5' 10)   Wt 90.7 kg (200 lb)   SpO2 99%   BMI 28.70 kg/m    General: Alert, oriented X 4, In NAD  Cardiovascular: Regular rate and rhythm,  systolic murmur.  Chest/Lungs: Clear to auscultation bilaterally, no wheezes or rhonchi.  Abdomen: Positive bowel sounds. Abdomen soft, nondistended, nontender.    Psych: Pleasant mood, good insight. Full affect       Discharge Medications:      Medication List  CHANGE how you take these medications    DULoxetine  60 mg capsule Commonly known as: CYMBALTA  Take 1 capsule (60 mg total) by mouth daily.   LORazepam  0.5 mg tablet Commonly known as: ATIVAN  Take 1 tablet (0.5 mg total) by mouth 2 (two) times a day as needed for anxiety. What changed:  when to take this reasons to take this       CONTINUE taking these medications    acetaminophen  500 mg tablet Commonly known as: TYLENOL  Take 1,000 mg by mouth every 8 (eight) hours as needed.   albuterol  HFA 90 mcg/actuation inhaler Commonly known as: PROVENTIL  HFA;VENTOLIN   HFA;PROAIR  HFA Inhale 2 puffs every 4 (four) hours as needed for wheezing.   alum-mag hydroxide-simethicone  400-400-40 mg/5 mL suspension Commonly known as: MAALOX MAX Take 30 mL by mouth every 4 (four) hours as needed for indigestion or heartburn.   ascorbic acid  500 mg tablet Commonly known as: VITAMIN C  Take 500 mg by mouth 3 (three) times a day.   atorvastatin  80 mg tablet Commonly known as: LIPITOR  Take 1 tablet by mouth daily.   busPIRone  15 mg tablet Commonly known as: BUSPAR  Take 15 mg by mouth 3 (three) times a day.   cranberry 450 mg Tab tablet Generic drug: cranberry fruit Take 450 mg by mouth daily.   cyanocobalamin  (vitamin B-12) 5,000 mcg Tbdl Dissolve 1 tablet on tongue daily.   cyclobenzaprine  10 mg tablet Commonly known as: FLEXERIL  Take 10 mg by mouth 3 (three) times a day.   dextran 70-hypromellose (PF) 0.1-0.3 % ophthalmic solution Administer 2 drops into both eyes 2 (two) times a day.   ferrous sulfate  325 mg (65 mg iron) tablet Take 325 mg by mouth 3 (three) times a day with meals.   fluticasone  propionate 50 mcg/spray nasal spray Commonly known as: FLONASE  Administer 1 spray into each nostril daily.   folic acid  400 mcg tablet Commonly known as: FOLVITE  Take 400 mcg by mouth daily.   guaiFENesin  100 mg/5 mL syrup Commonly known as: ROBITUSSIN Take 10 mL by mouth every 4 (four) hours as needed for cough.   lactulose 10 gram/15 mL solution Commonly known as: CHRONULAC Take 30 g by mouth every 12 (twelve) hours as needed.   lidocaine  4 % patch Commonly known as: SALONPAS Apply 1 patch topically daily.   lidocaine  HCL 3 % Glwp Apply 1 Application topically every 4 (four) hours as needed.   linaCLOtide  145 mcg Cap capsule Commonly known as: Linzess  Take 1 capsule (145 mcg total) by mouth daily. Take with 72mcg to equal 217mcg daily.   loratadine  10 mg tablet Commonly known as: CLARITIN  Take 10 mg by mouth daily.   magnesium   hydroxide 400 mg/5 mL suspension Commonly known as: MILK OF MAGNESIA Take 30 mL by mouth daily as needed for constipation.   melatonin 3 mg tablet Take 3 mg by mouth at bedtime.   morphine  15 mg tablet Commonly known as: MSIR Take 1 tablet (15 mg total) by mouth every 8 (eight) hours as needed for severe pain (7-10).   multivitamin with folic acid  400 mcg Tab Take 1 tablet by mouth Once Daily.   ondansetron  4 mg tablet Commonly known as: ZOFRAN  Take 4 mg by mouth every 6 (six) hours as needed for nausea or vomiting.   pantoprazole  40 mg EC tablet Commonly known as: PROTONIX  Take 40 mg by mouth 2 (two) times a day.   polyethylene glycol 17 gram packet Commonly known as: GLYCOLAX  Take 17 g  by mouth every 12 (twelve) hours as needed for constipation.   pregabalin  75 mg capsule Commonly known as: LYRICA  Take 1 capsule (75 mg total) by mouth 2 (two) times a day.   sennosides-docusate sodium  8.6-50 mg per tablet Commonly known as: PERICOLACE Take 2 tablets by mouth 2 (two) times a day.   SUMAtriptan  50 mg tablet Commonly known as: IMITREX  Take 50 mg by mouth once as needed for migraine. May repeat dose once in 2 hours if no relief.  Do not exceed 2 doses in 24 hours.   tamsulosin  0.4 mg Cap Commonly known as: FLOMAX  Take 0.4 mg by mouth daily.   thiamine  100 mg tablet Commonly known as: VITAMIN B1 Take 100 mg by mouth Once Daily.   UribeL 118-10-40.8-36 mg Cap Generic drug: methen-m.blue-s.phos-phsal-hyo Take 1 capsule by mouth every 6 (six) hours.       STOP taking these medications    docusate sodium  100 mg capsule Commonly known as: COLACE         Where to Get Your Medications     You can get these medications from any pharmacy   Bring a paper prescription for each of these medications LORazepam  0.5 mg tablet morphine  15 mg tablet pregabalin  75 mg capsule       Significant Diagnostic Tests:     No orders to display    LABS:  Recent Labs     12/10/24 2153 12/11/24 0717  NA 133* 136  K 4.5 4.2  CL 103 106  CO2 27 27  BUN 18 18  CREATININE 0.96 0.95  AST 14  --   ALT 10  --   PROT 6.3*  --   ALBUMIN 3.6  --   BILITOT 0.4  --     Lab Results  Component Value Date   WBC 1.43 (L) 12/11/2024   HGB 7.9 (L) 12/11/2024   HCT 24.1 (L) 12/11/2024   PLT 78 (L) 12/11/2024   CHOL 88 09/24/2024   TRIG 74 09/24/2024   HDL 36 (L) 09/24/2024   ALT 10 12/10/2024   AST 14 12/10/2024   NA 136 12/11/2024   K 4.2 12/11/2024   CL 106 12/11/2024   CREATININE 0.95 12/11/2024   BUN 18 12/11/2024   CO2 27 12/11/2024   TSH 1.809 07/26/2024   PTT 30.2 10/15/2024   INR 1.2 10/15/2024   HGBA1C 6.0 (H) 09/07/2024     Surgeries/Procedures:     Follow-up Appointments:     Future Appointments  Date Time Provider Department Center  01/07/2025  3:30 PM Worth Gretel Blush, MD [facility] OTO [facility]  01/07/2025  4:15 PM Courteney McClutchy, SLP [facility] MPM SL2 [facility] MP [facility]  01/13/2025  3:30 PM Alverna Lamar Sieving, DO [facility] CAR HP [facility]  01/20/2025  1:30 PM Caprice Rosella [place], [place] [facility] GAS [facility]  02/14/2025  1:30 PM Charon Flavia Bracket, NP [facility] CAR HP [facility] [address]      Appointments which have been scheduled for you    Jan 07, 2025 3:30 PM Consult with Worth Gretel Blush, MD [facility] - Otolaryngology MPM [facility] Medical [address] - [place]) [address] [place] [address] [phone]     Jan 07, 2025 4:15 PM VOICE EVALUATION with Jackye Schultz, SLP [facility] - MPM Outpatient Speech Language Pathology 02 [facility] [place]) [address] [place] [address] [phone]  Jan 13, 2025 3:30 PM Office Visit with Alverna Lamar Sieving, DO [facility]  - Cardiology [place] ([facility] [address] [facility]) [address] [place] [address] [phone]      Jan 20, 2025 1:30 PM Consult with Caprice Neill Mose, FNP [facility] - GASTROENTEROLOGY [place] [facility] [place]) [address] [place] [address] [phone]     Feb 14, 2025 1:30 PM Office Visit with Charon Flavia Bracket, NP [facility]  - Cardiology [place] ([facility] [address] [facility]) [address] [place] [address] [phone]          Contact information during this hospitalization (Please re-verify post-discharge): PCP: PCP Needed PPI PCP Phone: None        Medical History[1] Patient Active Problem List   Diagnosis Date Noted   GAVE (gastric antral vascular ectasia) 12/10/2024   Anemia 11/28/2024   Psychiatric disorder 11/28/2024   Heme positive stool 11/27/2024   Pancytopenia (CMD) 11/19/2024   Anxiety and depression 11/19/2024   Mixed hyperlipidemia 11/15/2024   Nonrheumatic aortic valve stenosis 11/15/2024   Acute on chronic anemia 10/22/2024   Constipation 09/13/2024   MDS (myelodysplastic syndrome)    (CMD) 09/13/2024   Impaired functional mobility, balance, gait, and endurance 09/09/2024   Anemia 09/07/2024   GI bleeding 07/25/2024   Anemia 06/08/2024   Hoarseness 02/20/2024   Impaired functional mobility, balance, gait, and endurance    Bilateral shoulder region arthritis 08/26/2020   Depressive disorder, not elsewhere classified 06/04/2019   Dizziness and giddiness 06/04/2019   Essential and other specified forms of tremor 06/04/2019   Headache 06/04/2019   Thoracic or lumbosacral neuritis or radiculitis, unspecified 06/04/2019   Bilateral primary osteoarthritis of first carpometacarpal joints 05/09/2019   Arthritis of knee 04/22/2019   Cellulitis of left lower extremity 03/27/2019   Diarrhea of presumed infectious origin 03/27/2019   Klebsiella pneumoniae infection 03/27/2019    Non-intractable vomiting with nausea 03/27/2019   Acute cystitis without hematuria 03/20/2019   Acute pain of left knee 02/05/2019   Posterior subcapsular age-related cataract, left eye 12/07/2018   Frequent falls 10/08/2018   Weakness 10/08/2018   ARF (acute renal failure) 08/16/2018   Acute blood loss anemia 08/16/2018   Chronic pain syndrome 08/15/2018   Essential hypertension 08/15/2018   Atherosclerotic cardiovascular disease 08/15/2018   Precordial pain 08/15/2018   Status post laminectomy with spinal fusion 08/15/2018   OSA (obstructive sleep apnea) 08/15/2018   Leukemoid reaction 08/15/2018   Hyperkalemia 08/15/2018   Cataract, nuclear sclerotic, left eye 07/08/2018   Occult blood in stools 05/28/2018   Opioid use disorder 03/28/2018   Acute hypoxemic respiratory failure    (CMD) 03/25/2018   Sinus tachycardia 03/25/2018   Acute metabolic encephalopathy 01/16/2018   Disorder of brain 10/24/2017   Elevated ALT measurement 09/25/2017   Narcotic dependence    (CMD) 09/25/2017   Adjustment disorder with anxious mood 08/21/2017   Adjustment disorder with mixed disturbance of emotions and conduct 08/20/2017   Age-related osteoporosis with current pathological fracture with delayed healing 08/19/2017   Closed compression fracture of first lumbar vertebra    (CMD) 08/19/2017   Class 1 obesity due to excess calories without serious comorbidity with body mass index (BMI) of 33.0 to 33.9 in adult 08/19/2017   Glenohumeral arthritis, left 06/18/2017   BPH (benign prostatic hyperplasia)  03/11/2017   Gastroesophageal reflux disease without esophagitis 03/11/2017   Anemia 03/11/2017   Thrombocytosis 03/11/2017   Chronic midline low back pain with bilateral sciatica    Continuous opioid dependence    (CMD)    Failed back syndrome, lumbosacral    Back pain 11/23/2016   Type 2 diabetes mellitus, without long-term current use of insulin  (CMD)  11/23/2016   Dyspnea 11/22/2016   Primary osteoarthritis of both knees 07/07/2015   Erectile dysfunction 10/30/2013     Time spent on discharge:  25 minutes.       [1] Past Medical History: Diagnosis Date   Anemia    Arthritis    BPH (benign prostatic hyperplasia)    Coronary artery disease    Depression    Diabetes mellitus (CMD)    Diabetes mellitus type II, controlled (CMD)    patient states per Dr. Carlo Hora that he is not diabetic   Difficulty swallowing    GERD (gastroesophageal reflux disease)    High cholesterol    Hypertension    Kidney stone    history   MRSA (methicillin resistant staph aureus) culture positive 08/10/2018   nasal swab   OSA (obstructive sleep apnea)    does not use CPAP  "

## 2024-12-16 ENCOUNTER — Other Ambulatory Visit: Payer: Self-pay

## 2024-12-22 ENCOUNTER — Telehealth: Payer: Self-pay | Admitting: Internal Medicine

## 2024-12-22 NOTE — Progress Notes (Unsigned)
 [facility] OFFICE PROGRESS NOTE  [name] LABOR, MD [address] [place] [address]  DIAGNOSIS: ***  PRIOR THERAPY:  CURRENT THERAPY:  INTERVAL HISTORY: Jorge Sherman 84 y.o. male returns for *** regular *** visit for followup of ***   MEDICAL HISTORY: Past Medical History:  Diagnosis Date   Anemia    Chronic coronary artery disease    Chronic pain    Depressive disorder, not elsewhere classified    Dizziness and giddiness    Essential and other specified forms of tremor    Headache(784.0)    History of blood transfusion 11/05/2024   Leukocytosis, unspecified    Mixed hyperlipidemia    Myelodysplastic syndrome (HCC)    Nonrheumatic aortic (valve) stenosis    moderate to severe aortic stenosis   Obstructive sleep apnea (adult) (pediatric)    Pain in joint, lower leg    Septic joint of left knee joint (HCC) 04/22/2019   Spinal stenosis, lumbar region, without neurogenic claudication    Thoracic or lumbosacral neuritis or radiculitis, unspecified    Type II or unspecified type diabetes mellitus without mention of complication, not stated as uncontrolled     ALLERGIES:  is allergic to cefepime, iodinated contrast media, iohexol , tositumomab, fentanyl , and iodine.  MEDICATIONS:  Current Outpatient Medications  Medication Sig Dispense Refill   acetaminophen  (TYLENOL ) 500 MG tablet Take 2 tablets (1,000 mg total) by mouth every 6 (six) hours as needed for headache, mild pain or fever. (Patient taking differently: Take 1,000 mg by mouth every 8 (eight) hours as needed for headache, mild pain (pain score 1-3) or fever.) 30 tablet 0   albuterol  (VENTOLIN  HFA) 108 (90 Base) MCG/ACT inhaler Inhale 2 puffs into the lungs every 4 (four) hours as needed for wheezing or shortness of breath. 1 each 0   alum & mag hydroxide-simeth (MAALOX PLUS) 400-400-40 MG/5ML suspension Take 30 mLs by mouth every 4 (four) hours as needed for indigestion.     ascorbic  acid (VITAMIN C ) 500 MG tablet Take 500 mg by mouth daily.     atorvastatin  (LIPITOR ) 80 MG tablet Take 80 mg by mouth at bedtime.     benzonatate  (TESSALON ) 100 MG capsule Take 100 mg by mouth 3 (three) times daily as needed for cough.     busPIRone  (BUSPAR ) 15 MG tablet Take 15 mg by mouth 3 (three) times daily.     clotrimazole-betamethasone  (LOTRISONE) cream Apply topically.     Cranberry 450 MG TABS Take 450 mg by mouth in the morning.     cyanocobalamin  (VITAMIN B12) 1000 MCG tablet Take 5,000 mcg by mouth daily.     cyclobenzaprine  (FLEXERIL ) 10 MG tablet Take 10 mg by mouth 3 (three) times daily.     Docusate Sodium  (DSS) 100 MG CAPS Take 100 mg by mouth in the morning and at bedtime.     DULoxetine  (CYMBALTA ) 60 MG capsule Take 60 mg by mouth daily.     ferrous sulfate  325 (65 FE) MG tablet Take 325 mg by mouth daily.      fluticasone  (FLONASE ) 50 MCG/ACT nasal spray Place 1 spray into the nose daily.     folic acid  (FOLVITE ) 400 MCG tablet Take 400 mcg by mouth every morning.     glycerin  adult 2 g suppository Place 1 suppository rectally every 4 (four) hours as needed for constipation.     guaiFENesin  (ROBITUSSIN) 100 MG/5ML liquid Take 10 mLs by mouth every 4 (four) hours as needed  for cough.     [Paused] hydroxychloroquine  (PLAQUENIL ) 200 MG tablet Take 200 mg by mouth 2 (two) times daily.     Hypromellose (NATURAL BALANCE TEARS OP) Place 2 drops into both eyes in the morning and at bedtime.     levofloxacin  (LEVAQUIN ) 750 MG tablet Take 750 mg by mouth every evening.     Lidocaine  3 % CREA Apply 1 application  topically every 4 (four) hours as needed.     lidocaine  4 % Place 1 patch onto the skin in the morning and at bedtime.     LINZESS  145 MCG CAPS capsule Take 145 mcg by mouth daily.     LINZESS  72 MCG capsule Take 72 mcg by mouth every morning.     loratadine  (CLARITIN ) 10 MG tablet Take 10 mg by mouth in the morning.     LORazepam  (ATIVAN ) 0.5 MG tablet Take 0.5 mg by mouth  2 (two) times daily.     magnesium  hydroxide (MILK OF MAGNESIA) 400 MG/5ML suspension Take 30 mLs by mouth daily as needed for mild constipation.     melatonin 3 MG TABS tablet Take 3 mg by mouth at bedtime.     Meth-Hyo-M Bl-Na Phos-Ph Sal (URIBEL) 118 MG CAPS Take 1 capsule by mouth every 6 (six) hours.     morphine  (MSIR) 15 MG tablet Take 15 mg by mouth in the morning, at noon, and at bedtime.     Multiple Vitamin (MULTIVITAMIN WITH MINERALS) TABS tablet Take 1 tablet by mouth daily.     ondansetron  (ZOFRAN ) 4 MG tablet Take 1 tablet (4 mg total) by mouth every 6 (six) hours as needed for nausea. 20 tablet 0   pantoprazole  (PROTONIX ) 40 MG tablet Take 1 tablet (40 mg total) by mouth daily. (Patient taking differently: Take 40 mg by mouth 2 (two) times daily.)     polyethylene glycol (MIRALAX  / GLYCOLAX ) 17 g packet Take 17 g by mouth daily as needed for mild constipation. (Patient taking differently: Take 17 g by mouth every 12 (twelve) hours as needed for mild constipation.) 14 each 0   pregabalin  (LYRICA ) 75 MG capsule Take 75 mg by mouth 2 (two) times daily.     sennosides-docusate sodium  (SENOKOT-S) 8.6-50 MG tablet Take 2 tablets by mouth in the morning and at bedtime.     SUMAtriptan  (IMITREX ) 50 MG tablet Take 50 mg by mouth every 2 (two) hours as needed for migraine or headache.     tamsulosin  (FLOMAX ) 0.4 MG CAPS capsule Take 0.4 mg by mouth every morning.     thiamine  100 MG tablet Take 1 tablet (100 mg total) by mouth daily. 30 tablet 0   No current facility-administered medications for this visit.    SURGICAL HISTORY:  Past Surgical History:  Procedure Laterality Date   BACK SURGERY     back surgey     times 3   GIVENS CAPSULE STUDY N/A 08/09/2024   Procedure: IMAGING PROCEDURE, GI TRACT, INTRALUMINAL, VIA CAPSULE;  Surgeon: Rosalie Kitchens, MD;  Location: [facility] ENDOSCOPY;  Service: Gastroenterology;  Laterality: N/A;   IR BONE MARROW BIOPSY & ASPIRATION  08/14/2024   IR  INJECT/THERA/INC NEEDLE/CATH/PLC EPI/LUMB/SAC W/IMG  11/26/2020    REVIEW OF SYSTEMS:   Review of Systems  Constitutional: Negative for appetite change, chills, fatigue, fever and unexpected weight change.  HENT:   Negative for mouth sores, nosebleeds, sore throat and trouble swallowing.   Eyes: Negative for eye problems and icterus.  Respiratory: Negative for cough,  hemoptysis, shortness of breath and wheezing.   Cardiovascular: Negative for chest pain and leg swelling.  Gastrointestinal: Negative for abdominal pain, constipation, diarrhea, nausea and vomiting.  Genitourinary: Negative for bladder incontinence, difficulty urinating, dysuria, frequency and hematuria.   Musculoskeletal: Negative for back pain, gait problem, neck pain and neck stiffness.  Skin: Negative for itching and rash.  Neurological: Negative for dizziness, extremity weakness, gait problem, headaches, light-headedness and seizures.  Hematological: Negative for adenopathy. Does not bruise/bleed easily.  Psychiatric/Behavioral: Negative for confusion, depression and sleep disturbance. The patient is not nervous/anxious.     PHYSICAL EXAMINATION:  There were no vitals taken for this visit.  ECOG PERFORMANCE STATUS: {CHL ONC ECOG [id number]  Physical Exam  Constitutional: Oriented to person, place, and time and well-developed, well-nourished, and in no distress. No distress.  HENT:  Head: Normocephalic and atraumatic.  Mouth/Throat: Oropharynx is clear and moist. No oropharyngeal exudate.  Eyes: Conjunctivae are normal. Right eye exhibits no discharge. Left eye exhibits no discharge. No scleral icterus.  Neck: Normal range of motion. Neck supple.  Cardiovascular: Normal rate, regular rhythm, normal heart sounds and intact distal pulses.   Pulmonary/Chest: Effort normal and breath sounds normal. No respiratory distress. No wheezes. No rales.  Abdominal: Soft. Bowel sounds are normal. Exhibits no distension and no  mass. There is no tenderness.  Musculoskeletal: Normal range of motion. Exhibits no edema.  Lymphadenopathy:    No cervical adenopathy.  Neurological: Alert and oriented to person, place, and time. Exhibits normal muscle tone. Gait normal. Coordination normal.  Skin: Skin is warm and dry. No rash noted. Not diaphoretic. No erythema. No pallor.  Psychiatric: Mood, memory and judgment normal.  Vitals reviewed.  LABORATORY DATA: Lab Results  Component Value Date   WBC 1.8 (L) 11/25/2024   HGB 8.9 (L) 11/25/2024   HCT 28.3 (L) 11/25/2024   MCV 92.2 11/25/2024   PLT 107 (L) 11/25/2024      Chemistry      Component Value Date/Time   NA 136 11/25/2024 1102   K 4.8 11/25/2024 1102   CL 100 11/25/2024 1102   CO2 27 11/25/2024 1102   BUN 18 11/25/2024 1102   CREATININE 1.03 11/25/2024 1102      Component Value Date/Time   CALCIUM  9.1 11/25/2024 1102   ALKPHOS 54 11/13/2024 1138   AST 16 11/13/2024 1138   AST 13 (L) 08/22/2024 1101   ALT 9 11/13/2024 1138   ALT 9 08/22/2024 1101   BILITOT 0.7 11/13/2024 1138   BILITOT 0.5 08/22/2024 1101       RADIOGRAPHIC STUDIES:  No results found.   ASSESSMENT/PLAN:  No problem-specific Assessment & Plan notes found for this encounter.   No orders of the defined types were placed in this encounter.    I spent {CHL ONC TIME VISIT - DTPQU:[id number]} counseling the patient face to face. The total time spent in the appointment was {CHL ONC TIME VISIT - DTPQU:[id number]}.  Bellany Elbaum L Vikrant Pryce, PA-C 12/22/24

## 2024-12-22 NOTE — Telephone Encounter (Signed)
 Called to get the patient rescheduled due to the cancer center being closed on Monday 2/2. Talked with Shanda from [facility] and she is aware of the changes made to the patients upcoming appointments.

## 2024-12-23 ENCOUNTER — Other Ambulatory Visit: Payer: Self-pay | Admitting: Physician Assistant

## 2024-12-23 ENCOUNTER — Encounter: Payer: Self-pay | Admitting: Internal Medicine

## 2024-12-23 ENCOUNTER — Inpatient Hospital Stay

## 2024-12-23 ENCOUNTER — Inpatient Hospital Stay: Admitting: Physician Assistant

## 2024-12-23 NOTE — Progress Notes (Signed)
 Pharmacy to Manage Vancomycin  Initiation Note Adult  Vancomycin  Consult Orders (From admission, onward)     Start     Ordered Stop   12/23/24 1545  Vancomycin  Pharmacy to Manage  miscellaneous,   Pharmacy to Manage       Question Answer Comment  Primary System Affected (Source): Skin/Soft Tissue   Type of Infection (SSSI): Cellulitis / Abscess   Patient Status: Non-Critically Ill      12/23/24 1546 --           Objective Weight: 97.5 kg (215 lb), Ideal body weight: 73 kg (160 lb 15 oz) Adjusted ideal body weight: 82.8 kg (182 lb 9 oz),   Height: 1.778 m (5' 10) (12/10/2024  7:22 PM) Body mass index is 30.85 kg/m.  Serum creatinine: 1 mg/dL 97/97/73 9386 Estimated creatinine clearance: 64.4 mL/min Renal replacement therapy: No   Microbiology results: No results found for this visit on 12/23/24 (from the past 48 hours).  Vancomycin  Administrations (last 168 hours)     None       Assessment/Plan Briefly, Jorge Sherman is a 84 y.o. male started on vancomycin  for SSTI   Cellulitis/abscess       .Patient did receive a previous loading dose of 1500 mg (15 mg/kg) x 1 dose in ED. Loading dose will not be ordered. Maintenance dose is 750 mg (10 mg/kg) IVPB every 24 hours  per protocol .  Will plan for Vancomycin  level prior to 3rd or 4th dose. Goal is 10-15 mcg/ml. Blood cultures are pending.   Pharmacy will continue to monitor patient (renal function, microbiology data, risk factors for adverse events, appropriateness of therapy), will order/monitor serum levels as appropriate, and will adjust dose if/when necessary.  Thank you, Charmaine FORBES Brill, PharmD, BCPS 12/23/2024 4:30 PM

## 2024-12-24 ENCOUNTER — Inpatient Hospital Stay: Attending: Internal Medicine

## 2024-12-24 ENCOUNTER — Inpatient Hospital Stay

## 2024-12-24 ENCOUNTER — Inpatient Hospital Stay: Admitting: Internal Medicine

## 2024-12-24 ENCOUNTER — Other Ambulatory Visit: Payer: Self-pay | Admitting: Physician Assistant

## 2024-12-24 DIAGNOSIS — D469 Myelodysplastic syndrome, unspecified: Secondary | ICD-10-CM

## 2024-12-24 NOTE — Progress Notes (Signed)
 ------------------------------------------------------------------------------- Attestation signed by Jorge Rosalynn Fleischer, MD at 12/24/2024  8:50 PM Patient was seen after full presentation from Jorge Coni Pastel, FNP.  Please see my independent note documenting my face-to-face encounter with history and physical exam.  My independent note should serve as my attestation.  I performed a majority of the Medical Decision Making for this patient encounter.  Patient's presentation is most consistent with acute presentation with potential threat to life or bodily function.    -------------------------------------------------------------------------------  CDU OBSERVATION - ANEMIA   Observation Start Time: 1252 on 12/23/2024  Jorge Sherman was seen on morning rounds in the CDU.  Shared service with Dr. Franky Sherman.  Patient was appropriately risk stratified for observation care due to the chief complaint of low hemoglobin, requiring observation for cardiac monitoring and blood transfusion. The patient is currently in observation status in order to determine necessity of potential hospital admission.    History of Present Illness Jorge Sherman 84 y.o. male with PMHx significant for myelodysplastic syndrome with recurrent anemia, T2DM, CAD, HFpEF, HTN, HLD, Chronic Pain, GAD, MDD, Obesity, GAVE, Severe AS who presented to the ED from his assisted living facility on December 23, 2024 with reports of epistaxis and also found to have a left leg cellulitis.  Review of Systems  PMH, SH, FH, home medications, allergies confirmed with patient.  PDMP & Epic note reviewed.  Patient reports feeling well this morning and offers no complaints.  Patient reports his leg to look improved from yesterday.  He confirms tolerating PO intake and is able to stand and pivot to his wheel chair without issue.   Physical Examination Vitals: BP (!) 116/57 (BP Location: Right arm, Patient Position: Lying)   Pulse  76   Temp 97.9 F (36.6 C) (Oral)   Resp 18   Wt 97.5 kg (215 lb)   SpO2 97%   BMI 30.85 kg/m  Physical Exam Vitals and nursing note reviewed.  Constitutional:      General: He is not in acute distress.    Appearance: Normal appearance. He is not ill-appearing.  HENT:     Head: Normocephalic and atraumatic.     Nose: Nose normal.  Eyes:     General: No scleral icterus.    Extraocular Movements: Extraocular movements intact.  Cardiovascular:     Rate and Rhythm: Normal rate and regular rhythm.     Heart sounds: No murmur heard. Pulmonary:     Effort: Pulmonary effort is normal.     Breath sounds: Normal breath sounds.  Abdominal:     General: Bowel sounds are normal.     Palpations: Abdomen is soft.  Musculoskeletal:     Right lower leg: No edema.     Left lower leg: No edema.     Comments: Wound to left lower leg, bandage in place, erythema decreased from yesterday image. See wound care nurse for additional comments.   Skin:    General: Skin is warm and dry.     Comments: Bruising to BUE, thin skin  Neurological:     Mental Status: He is alert and oriented to person, place, and time.  Psychiatric:        Mood and Affect: Mood normal.        Behavior: Behavior normal.        Thought Content: Thought content normal.        Judgment: Judgment normal.    Medical Decision Making  Differential diagnoses includes but is not limited to:  IDA, Acute Blood Loss Anemia, Hemolytic Anemia, Abnormal Uterine Bleeding, GI Cancer, PUD, Esophagitis, Gastritis/Gastropathy, Duodenitis/Duodenopathy, Portal Hypertension, Esphogeal/Rectal Varices, Vascular Lesions, Mallory-Weiss Syndrome, Upper Gastrointestinal Tumors, Hemobilia.    CBC shows a known leukopenia with WBC  2.06 and decreased to 1.68 (at baseline) in the setting of myelodysplastic syndrome. Hemoglobin 6.8 --> 8.4 after receiving 2U PRBC.   Assessment & Plan  The patient is currently in observation status in order to determine  necessity of potential hospital admission.  The patient presentation is consistent with acute presentation with potential threat to life or bodily function.      MDS with Anemia & Leukopenia Epistaxis Patient will remain on continuous cardiac and pulse oximetry monitoring.   Hgb 6.5 --> 6.8 --> 8.4 s/p 2U PRBC   Cellulitis of LLE Patient previously taking Augmentin  and ciprofloxacin  with addition of vancomycin  per infectious disease ID continues to follow and is greatly appreciated  Chronic & Co-Morbid Conditions Home medications have been reconciled and ordered accordingly.  Other conditions are stable and will be managed per home medication regimen with adjustments as necessary.  Disposition / Discharge Recommendations BP 115/64 (BP Location: Left arm, Patient Position: Lying)   Pulse 83   Temp 98.1 F (36.7 C) (Oral)   Resp 18   Wt 97.5 kg (215 lb)   SpO2 98%   BMI 30.85 kg/m   No adverse events reported overnight.   Vital signs remain stable and patient afebrile.  Patient reports feeling well, confirms no pain to leg and offers no complaints at this time. He indicates readiness for discharge back to [facility]. No recurrent epistaxis and hemoglobin has improved from 6.5 to 8.4g/dL  s/p 2U PRBC.  Patient was seen in consultation by infectious disease who recommended he receive an additional dose of vancomycin  today and be continued on both doxycycline  and ciprofloxacin  for 7 additional days.  Patient remains hemodynamically stable and is clinically ready for discharge back to [facility]. Results of workup and recommendations for follow-up care reviewed with patient in detail and all questions answered.  Patient has been asked to follow-up with his PCP at [name] within 3 days of discharge to ensure infection is continuing to clear and to have labs rechecked.  Nursing facility pharmacy was contacted and requested paper prescriptions be sent back to facility.   Pharmacist confirms that medications are on hand and will be readily available to patient as prescribed.  RN has contacted facility and arrange for transport back via [facility].   Clinical Impression 1. Epistaxis Temporary  2. Anemia, unspecified type Chronic  3. Cellulitis of lower extremity, unspecified laterality Active   New Prescriptions   CIPROFLOXACIN  (CIPRO ) 500 MG TABLET    Take 1.5 tablets (750 mg total) by mouth 2 (two) times a day for 7 days.   DOXYCYCLINE  (VIBRA -TABS) 100 MG TABLET    Take 1 tablet (100 mg total) by mouth 2 (two) times a day for 7 days. Take with 8 oz water . Do not lie down for at least 30 minutes after.   Outpatient Follow-up Recommendations: [facility] -  EMERGENCY DEPARTMENT [address] [employer] [place]  [phone] Go to  As needed  [name] P[facility]  In 3 days   Provider time spent in patient care today, inclusive of but not limited to clinical reassessment, review of diagnostic studies, and discharge preparation, was greater than 30 minutes.   Chart completed using  Psychologist, Clinical

## 2024-12-25 ENCOUNTER — Inpatient Hospital Stay

## 2024-12-26 ENCOUNTER — Inpatient Hospital Stay

## 2024-12-27 ENCOUNTER — Inpatient Hospital Stay

## 2024-12-30 ENCOUNTER — Inpatient Hospital Stay: Admitting: Internal Medicine

## 2024-12-30 ENCOUNTER — Inpatient Hospital Stay: Attending: Internal Medicine

## 2024-12-30 ENCOUNTER — Inpatient Hospital Stay

## 2024-12-31 ENCOUNTER — Inpatient Hospital Stay

## 2025-01-01 ENCOUNTER — Inpatient Hospital Stay

## 2025-01-02 ENCOUNTER — Inpatient Hospital Stay

## 2025-01-03 ENCOUNTER — Inpatient Hospital Stay

## 2025-01-20 ENCOUNTER — Inpatient Hospital Stay

## 2025-01-20 ENCOUNTER — Inpatient Hospital Stay: Admitting: Internal Medicine

## 2025-01-21 ENCOUNTER — Inpatient Hospital Stay

## 2025-01-22 ENCOUNTER — Inpatient Hospital Stay

## 2025-01-23 ENCOUNTER — Inpatient Hospital Stay

## 2025-01-24 ENCOUNTER — Inpatient Hospital Stay

## 2025-01-24 ENCOUNTER — Inpatient Hospital Stay: Attending: Internal Medicine

## 2025-01-27 ENCOUNTER — Inpatient Hospital Stay: Attending: Internal Medicine

## 2025-01-27 ENCOUNTER — Inpatient Hospital Stay: Admitting: Internal Medicine

## 2025-01-27 ENCOUNTER — Inpatient Hospital Stay

## 2025-01-28 ENCOUNTER — Inpatient Hospital Stay

## 2025-01-29 ENCOUNTER — Inpatient Hospital Stay

## 2025-01-30 ENCOUNTER — Inpatient Hospital Stay

## 2025-01-31 ENCOUNTER — Inpatient Hospital Stay
